# Patient Record
Sex: Female | Born: 1970
Health system: Southern US, Community
[De-identification: ages and names within clinical notes are randomized; demographics above are authoritative.]

## PROBLEM LIST (undated history)

## (undated) DIAGNOSIS — M2550 Pain in unspecified joint: Secondary | ICD-10-CM

## (undated) DIAGNOSIS — F988 Other specified behavioral and emotional disorders with onset usually occurring in childhood and adolescence: Secondary | ICD-10-CM

## (undated) DIAGNOSIS — J45909 Unspecified asthma, uncomplicated: Secondary | ICD-10-CM

## (undated) DIAGNOSIS — J189 Pneumonia, unspecified organism: Secondary | ICD-10-CM

## (undated) DIAGNOSIS — E559 Vitamin D deficiency, unspecified: Secondary | ICD-10-CM

## (undated) DIAGNOSIS — K5900 Constipation, unspecified: Secondary | ICD-10-CM

## (undated) DIAGNOSIS — F319 Bipolar disorder, unspecified: Secondary | ICD-10-CM

## (undated) DIAGNOSIS — F3289 Other specified depressive episodes: Secondary | ICD-10-CM

## (undated) DIAGNOSIS — J449 Chronic obstructive pulmonary disease, unspecified: Secondary | ICD-10-CM

## (undated) DIAGNOSIS — D751 Secondary polycythemia: Secondary | ICD-10-CM

## (undated) DIAGNOSIS — R7303 Prediabetes: Secondary | ICD-10-CM

## (undated) DIAGNOSIS — G43009 Migraine without aura, not intractable, without status migrainosus: Secondary | ICD-10-CM

## (undated) DIAGNOSIS — K219 Gastro-esophageal reflux disease without esophagitis: Secondary | ICD-10-CM

## (undated) DIAGNOSIS — I10 Essential (primary) hypertension: Secondary | ICD-10-CM

## (undated) DIAGNOSIS — I2510 Atherosclerotic heart disease of native coronary artery without angina pectoris: Secondary | ICD-10-CM

## (undated) DIAGNOSIS — F411 Generalized anxiety disorder: Secondary | ICD-10-CM

## (undated) DIAGNOSIS — M1990 Unspecified osteoarthritis, unspecified site: Secondary | ICD-10-CM

## (undated) DIAGNOSIS — G709 Myoneural disorder, unspecified: Secondary | ICD-10-CM

## (undated) DIAGNOSIS — F329 Major depressive disorder, single episode, unspecified: Secondary | ICD-10-CM

## (undated) DIAGNOSIS — R569 Unspecified convulsions: Secondary | ICD-10-CM

## (undated) DIAGNOSIS — T7840XA Allergy, unspecified, initial encounter: Secondary | ICD-10-CM

## (undated) HISTORY — DX: Generalized anxiety disorder: F41.1

## (undated) HISTORY — DX: Vitamin D deficiency, unspecified: E55.9

## (undated) HISTORY — PX: ABDOMINAL HYSTERECTOMY: SHX81

## (undated) HISTORY — DX: Gastro-esophageal reflux disease without esophagitis: K21.9

## (undated) HISTORY — DX: Major depressive disorder, single episode, unspecified: F32.9

## (undated) HISTORY — DX: Pain in unspecified joint: M25.50

## (undated) HISTORY — DX: Other specified behavioral and emotional disorders with onset usually occurring in childhood and adolescence: F98.8

## (undated) HISTORY — PX: TONSILLECTOMY: SHX5217

## (undated) HISTORY — PX: TUBAL LIGATION: SHX77

## (undated) HISTORY — DX: Unspecified asthma, uncomplicated: J45.909

## (undated) HISTORY — PX: KNEE ARTHROSCOPY: SHX127

## (undated) HISTORY — DX: Unspecified convulsions: R56.9

## (undated) HISTORY — DX: Essential (primary) hypertension: I10

## (undated) HISTORY — DX: Myoneural disorder, unspecified: G70.9

## (undated) HISTORY — DX: Other specified depressive episodes: F32.89

## (undated) HISTORY — DX: Chronic obstructive pulmonary disease, unspecified: J44.9

## (undated) HISTORY — DX: Allergy, unspecified, initial encounter: T78.40XA

## (undated) HISTORY — PX: FRACTURE SURGERY: SHX138

## (undated) HISTORY — DX: Migraine without aura, not intractable, without status migrainosus: G43.009

---

## 1997-10-04 ENCOUNTER — Ambulatory Visit (HOSPITAL_COMMUNITY): Admission: RE | Admit: 1997-10-04 | Discharge: 1997-10-04 | Payer: Self-pay | Admitting: Internal Medicine

## 1997-10-12 HISTORY — PX: OTHER SURGICAL HISTORY: SHX169

## 1997-12-08 ENCOUNTER — Other Ambulatory Visit: Admission: RE | Admit: 1997-12-08 | Discharge: 1997-12-08 | Payer: Self-pay | Admitting: Obstetrics and Gynecology

## 1998-01-28 HISTORY — PX: PLANTAR FASCIA SURGERY: SHX746

## 1998-11-01 ENCOUNTER — Other Ambulatory Visit: Admission: RE | Admit: 1998-11-01 | Discharge: 1998-11-01 | Payer: Self-pay | Admitting: Obstetrics and Gynecology

## 1999-04-14 ENCOUNTER — Emergency Department (HOSPITAL_COMMUNITY): Admission: EM | Admit: 1999-04-14 | Discharge: 1999-04-14 | Payer: Self-pay | Admitting: Emergency Medicine

## 1999-04-14 ENCOUNTER — Encounter: Payer: Self-pay | Admitting: Emergency Medicine

## 1999-10-14 ENCOUNTER — Emergency Department (HOSPITAL_COMMUNITY): Admission: EM | Admit: 1999-10-14 | Discharge: 1999-10-14 | Payer: Self-pay | Admitting: Emergency Medicine

## 2000-04-29 HISTORY — PX: TONSILLECTOMY: SUR1361

## 2000-05-01 ENCOUNTER — Encounter (INDEPENDENT_AMBULATORY_CARE_PROVIDER_SITE_OTHER): Payer: Self-pay | Admitting: *Deleted

## 2000-05-01 ENCOUNTER — Ambulatory Visit (HOSPITAL_BASED_OUTPATIENT_CLINIC_OR_DEPARTMENT_OTHER): Admission: RE | Admit: 2000-05-01 | Discharge: 2000-05-01 | Payer: Self-pay | Admitting: *Deleted

## 2000-05-07 ENCOUNTER — Observation Stay (HOSPITAL_COMMUNITY): Admission: EM | Admit: 2000-05-07 | Discharge: 2000-05-08 | Payer: Self-pay | Admitting: Emergency Medicine

## 2002-11-29 ENCOUNTER — Emergency Department (HOSPITAL_COMMUNITY): Admission: EM | Admit: 2002-11-29 | Discharge: 2002-11-29 | Payer: Self-pay | Admitting: Emergency Medicine

## 2003-08-02 ENCOUNTER — Emergency Department (HOSPITAL_COMMUNITY): Admission: EM | Admit: 2003-08-02 | Discharge: 2003-08-02 | Payer: Self-pay | Admitting: Family Medicine

## 2003-08-22 ENCOUNTER — Encounter: Admission: RE | Admit: 2003-08-22 | Discharge: 2003-08-22 | Payer: Self-pay | Admitting: Internal Medicine

## 2003-12-06 ENCOUNTER — Ambulatory Visit: Payer: Self-pay | Admitting: Internal Medicine

## 2004-01-03 ENCOUNTER — Other Ambulatory Visit: Admission: RE | Admit: 2004-01-03 | Discharge: 2004-01-03 | Payer: Self-pay | Admitting: Obstetrics and Gynecology

## 2004-01-10 ENCOUNTER — Ambulatory Visit: Payer: Self-pay | Admitting: Internal Medicine

## 2004-05-14 ENCOUNTER — Ambulatory Visit: Payer: Self-pay | Admitting: Internal Medicine

## 2004-06-19 ENCOUNTER — Ambulatory Visit: Payer: Self-pay | Admitting: Internal Medicine

## 2004-07-10 ENCOUNTER — Ambulatory Visit: Payer: Self-pay | Admitting: Internal Medicine

## 2004-08-14 ENCOUNTER — Ambulatory Visit: Payer: Self-pay | Admitting: Internal Medicine

## 2004-09-13 ENCOUNTER — Ambulatory Visit: Payer: Self-pay | Admitting: Internal Medicine

## 2004-11-15 ENCOUNTER — Ambulatory Visit: Payer: Self-pay | Admitting: Internal Medicine

## 2005-01-28 HISTORY — PX: PARTIAL HYSTERECTOMY: SHX80

## 2005-02-12 ENCOUNTER — Ambulatory Visit: Payer: Self-pay | Admitting: Internal Medicine

## 2005-03-26 ENCOUNTER — Ambulatory Visit: Payer: Self-pay | Admitting: Internal Medicine

## 2005-05-17 ENCOUNTER — Ambulatory Visit: Payer: Self-pay | Admitting: Internal Medicine

## 2005-05-30 ENCOUNTER — Ambulatory Visit: Payer: Self-pay | Admitting: Internal Medicine

## 2005-08-29 ENCOUNTER — Ambulatory Visit: Payer: Self-pay | Admitting: Unknown Physician Specialty

## 2005-12-27 ENCOUNTER — Ambulatory Visit: Payer: Self-pay | Admitting: Internal Medicine

## 2006-06-18 ENCOUNTER — Ambulatory Visit: Payer: Self-pay | Admitting: Internal Medicine

## 2006-11-12 ENCOUNTER — Encounter: Payer: Self-pay | Admitting: Internal Medicine

## 2006-11-12 DIAGNOSIS — F411 Generalized anxiety disorder: Secondary | ICD-10-CM | POA: Insufficient documentation

## 2006-11-12 DIAGNOSIS — I10 Essential (primary) hypertension: Secondary | ICD-10-CM | POA: Insufficient documentation

## 2006-11-12 DIAGNOSIS — J45909 Unspecified asthma, uncomplicated: Secondary | ICD-10-CM | POA: Insufficient documentation

## 2006-12-11 ENCOUNTER — Ambulatory Visit: Payer: Self-pay | Admitting: Internal Medicine

## 2006-12-11 LAB — CONVERTED CEMR LAB
ALT: 18 units/L (ref 0–35)
AST: 18 units/L (ref 0–37)
Albumin: 4 g/dL (ref 3.5–5.2)
Alkaline Phosphatase: 106 units/L (ref 39–117)
BUN: 11 mg/dL (ref 6–23)
Basophils Absolute: 0 10*3/uL (ref 0.0–0.1)
Basophils Relative: 0.6 % (ref 0.0–1.0)
Bilirubin, Direct: 0.1 mg/dL (ref 0.0–0.3)
CO2: 26 meq/L (ref 19–32)
Calcium: 9.1 mg/dL (ref 8.4–10.5)
Chloride: 104 meq/L (ref 96–112)
Creatinine, Ser: 0.6 mg/dL (ref 0.4–1.2)
Eosinophils Absolute: 0.1 10*3/uL (ref 0.0–0.6)
Eosinophils Relative: 1.7 % (ref 0.0–5.0)
GFR calc Af Amer: 147 mL/min
GFR calc non Af Amer: 122 mL/min
Glucose, Bld: 96 mg/dL (ref 70–99)
HCT: 43 % (ref 36.0–46.0)
Hemoglobin: 14.9 g/dL (ref 12.0–15.0)
Lymphocytes Relative: 27 % (ref 12.0–46.0)
MCHC: 34.6 g/dL (ref 30.0–36.0)
MCV: 92 fL (ref 78.0–100.0)
Monocytes Absolute: 0.8 10*3/uL — ABNORMAL HIGH (ref 0.2–0.7)
Monocytes Relative: 12 % — ABNORMAL HIGH (ref 3.0–11.0)
Neutro Abs: 4.1 10*3/uL (ref 1.4–7.7)
Neutrophils Relative %: 58.7 % (ref 43.0–77.0)
Platelets: 259 10*3/uL (ref 150–400)
Potassium: 4 meq/L (ref 3.5–5.1)
RBC: 4.67 M/uL (ref 3.87–5.11)
RDW: 11.5 % (ref 11.5–14.6)
Sodium: 137 meq/L (ref 135–145)
TSH: 2.06 microintl units/mL (ref 0.35–5.50)
Total Bilirubin: 0.8 mg/dL (ref 0.3–1.2)
Total Protein: 7.2 g/dL (ref 6.0–8.3)
WBC: 6.9 10*3/uL (ref 4.5–10.5)

## 2006-12-13 ENCOUNTER — Encounter: Payer: Self-pay | Admitting: Internal Medicine

## 2006-12-15 LAB — CONVERTED CEMR LAB: Vit D, 1,25-Dihydroxy: 19 — ABNORMAL LOW (ref 30–89)

## 2006-12-16 ENCOUNTER — Ambulatory Visit: Payer: Self-pay | Admitting: Internal Medicine

## 2006-12-16 DIAGNOSIS — E559 Vitamin D deficiency, unspecified: Secondary | ICD-10-CM | POA: Insufficient documentation

## 2006-12-16 DIAGNOSIS — G4700 Insomnia, unspecified: Secondary | ICD-10-CM | POA: Insufficient documentation

## 2007-02-06 ENCOUNTER — Ambulatory Visit: Payer: Self-pay | Admitting: Internal Medicine

## 2007-02-06 DIAGNOSIS — R635 Abnormal weight gain: Secondary | ICD-10-CM | POA: Insufficient documentation

## 2007-02-06 DIAGNOSIS — R32 Unspecified urinary incontinence: Secondary | ICD-10-CM | POA: Insufficient documentation

## 2007-03-16 ENCOUNTER — Encounter: Payer: Self-pay | Admitting: Internal Medicine

## 2007-07-16 ENCOUNTER — Encounter: Payer: Self-pay | Admitting: Internal Medicine

## 2007-08-26 ENCOUNTER — Telehealth: Payer: Self-pay | Admitting: Internal Medicine

## 2007-10-08 ENCOUNTER — Ambulatory Visit: Payer: Self-pay | Admitting: Internal Medicine

## 2007-10-08 DIAGNOSIS — F988 Other specified behavioral and emotional disorders with onset usually occurring in childhood and adolescence: Secondary | ICD-10-CM | POA: Insufficient documentation

## 2007-10-08 DIAGNOSIS — R209 Unspecified disturbances of skin sensation: Secondary | ICD-10-CM | POA: Insufficient documentation

## 2007-11-04 ENCOUNTER — Ambulatory Visit: Payer: Self-pay | Admitting: Internal Medicine

## 2007-12-01 ENCOUNTER — Telehealth: Payer: Self-pay | Admitting: Internal Medicine

## 2007-12-21 ENCOUNTER — Telehealth: Payer: Self-pay | Admitting: Internal Medicine

## 2008-01-13 ENCOUNTER — Ambulatory Visit: Payer: Self-pay | Admitting: Internal Medicine

## 2008-01-13 ENCOUNTER — Telehealth: Payer: Self-pay | Admitting: Internal Medicine

## 2008-01-13 LAB — CONVERTED CEMR LAB
Bilirubin Urine: NEGATIVE
Hemoglobin, Urine: NEGATIVE
Ketones, ur: NEGATIVE mg/dL
Leukocytes, UA: NEGATIVE
Nitrite: NEGATIVE
Specific Gravity, Urine: <=1.005 (ref 1.000–1.03)
Total Protein, Urine: NEGATIVE mg/dL
Urine Glucose: NEGATIVE mg/dL
Urobilinogen, UA: 0.2 (ref 0.0–1.0)
pH: 6 (ref 5.0–8.0)

## 2008-01-14 ENCOUNTER — Ambulatory Visit: Payer: Self-pay | Admitting: Internal Medicine

## 2008-01-14 DIAGNOSIS — R300 Dysuria: Secondary | ICD-10-CM | POA: Insufficient documentation

## 2008-03-10 ENCOUNTER — Telehealth: Payer: Self-pay | Admitting: Internal Medicine

## 2008-04-25 ENCOUNTER — Ambulatory Visit: Payer: Self-pay | Admitting: Internal Medicine

## 2008-05-23 ENCOUNTER — Telehealth: Payer: Self-pay | Admitting: Internal Medicine

## 2008-07-27 ENCOUNTER — Ambulatory Visit: Payer: Self-pay | Admitting: Internal Medicine

## 2008-07-29 LAB — CONVERTED CEMR LAB
ALT: 20 units/L (ref 0–35)
AST: 18 units/L (ref 0–37)
Albumin: 4 g/dL (ref 3.5–5.2)
Alkaline Phosphatase: 120 units/L — ABNORMAL HIGH (ref 39–117)
BUN: 9 mg/dL (ref 6–23)
Basophils Absolute: 0.1 10*3/uL (ref 0.0–0.1)
Basophils Relative: 0.7 % (ref 0.0–3.0)
Bilirubin Urine: NEGATIVE
Bilirubin, Direct: 0.2 mg/dL (ref 0.0–0.3)
CO2: 28 meq/L (ref 19–32)
Calcium: 9.3 mg/dL (ref 8.4–10.5)
Chloride: 107 meq/L (ref 96–112)
Creatinine, Ser: 0.6 mg/dL (ref 0.4–1.2)
Eosinophils Absolute: 0.3 10*3/uL (ref 0.0–0.7)
Eosinophils Relative: 3.7 % (ref 0.0–5.0)
GFR calc non Af Amer: 120.45 mL/min (ref >60)
Glucose, Bld: 94 mg/dL (ref 70–99)
HCT: 42.8 % (ref 36.0–46.0)
Hemoglobin, Urine: NEGATIVE
Hemoglobin: 15.2 g/dL — ABNORMAL HIGH (ref 12.0–15.0)
Ketones, ur: NEGATIVE mg/dL
Leukocytes, UA: NEGATIVE
Lymphocytes Relative: 26.6 % (ref 12.0–46.0)
Lymphs Abs: 2.1 10*3/uL (ref 0.7–4.0)
MCHC: 35.6 g/dL (ref 30.0–36.0)
MCV: 90.7 fL (ref 78.0–100.0)
Monocytes Absolute: 0.8 10*3/uL (ref 0.1–1.0)
Monocytes Relative: 9.8 % (ref 3.0–12.0)
Neutro Abs: 4.6 10*3/uL (ref 1.4–7.7)
Neutrophils Relative %: 59.2 % (ref 43.0–77.0)
Nitrite: NEGATIVE
Platelets: 210 10*3/uL (ref 150.0–400.0)
Potassium: 4.2 meq/L (ref 3.5–5.1)
RBC: 4.72 M/uL (ref 3.87–5.11)
RDW: 11.3 % — ABNORMAL LOW (ref 11.5–14.6)
Sed Rate: 15 mm/hr (ref 0–22)
Sodium: 141 meq/L (ref 135–145)
Specific Gravity, Urine: <=1.005 (ref 1.000–1.030)
TSH: 3.14 microintl units/mL (ref 0.35–5.50)
Total Bilirubin: 0.6 mg/dL (ref 0.3–1.2)
Total Protein, Urine: NEGATIVE mg/dL
Total Protein: 7.2 g/dL (ref 6.0–8.3)
Urine Glucose: NEGATIVE mg/dL
Urobilinogen, UA: 0.2 (ref 0.0–1.0)
Vitamin B-12: 728 pg/mL (ref 211–911)
WBC: 7.9 10*3/uL (ref 4.5–10.5)
pH: 7.5 (ref 5.0–8.0)

## 2008-08-02 LAB — CONVERTED CEMR LAB: Vit D, 25-Hydroxy: 19 ng/mL — ABNORMAL LOW (ref 30–89)

## 2008-09-08 ENCOUNTER — Ambulatory Visit: Payer: Self-pay | Admitting: Internal Medicine

## 2008-09-08 DIAGNOSIS — L03319 Cellulitis of trunk, unspecified: Secondary | ICD-10-CM

## 2008-09-08 DIAGNOSIS — L02219 Cutaneous abscess of trunk, unspecified: Secondary | ICD-10-CM | POA: Insufficient documentation

## 2008-09-10 ENCOUNTER — Emergency Department (HOSPITAL_COMMUNITY): Admission: EM | Admit: 2008-09-10 | Discharge: 2008-09-10 | Payer: Self-pay | Admitting: Emergency Medicine

## 2008-09-11 ENCOUNTER — Emergency Department (HOSPITAL_COMMUNITY): Admission: EM | Admit: 2008-09-11 | Discharge: 2008-09-11 | Payer: Self-pay | Admitting: Emergency Medicine

## 2008-11-14 ENCOUNTER — Ambulatory Visit: Payer: Self-pay | Admitting: Internal Medicine

## 2008-11-14 DIAGNOSIS — F17200 Nicotine dependence, unspecified, uncomplicated: Secondary | ICD-10-CM | POA: Insufficient documentation

## 2009-01-28 HISTORY — PX: INCISE AND DRAIN ABCESS: PRO64

## 2009-01-30 ENCOUNTER — Telehealth: Payer: Self-pay | Admitting: Internal Medicine

## 2009-02-03 ENCOUNTER — Telehealth: Payer: Self-pay | Admitting: Internal Medicine

## 2009-02-28 ENCOUNTER — Telehealth: Payer: Self-pay | Admitting: Internal Medicine

## 2009-03-01 ENCOUNTER — Ambulatory Visit: Payer: Self-pay | Admitting: Internal Medicine

## 2009-03-01 LAB — CONVERTED CEMR LAB
BUN: 12 mg/dL (ref 6–23)
CO2: 26 meq/L (ref 19–32)
Calcium: 9.5 mg/dL (ref 8.4–10.5)
Chloride: 104 meq/L (ref 96–112)
Creatinine, Ser: 0.6 mg/dL (ref 0.4–1.2)
GFR calc non Af Amer: 120.05 mL/min (ref >60)
Glucose, Bld: 115 mg/dL — ABNORMAL HIGH (ref 70–99)
Potassium: 3.7 meq/L (ref 3.5–5.1)
Sodium: 137 meq/L (ref 135–145)

## 2009-03-05 LAB — CONVERTED CEMR LAB: Vit D, 25-Hydroxy: 24 ng/mL — ABNORMAL LOW (ref 30–89)

## 2009-03-06 ENCOUNTER — Ambulatory Visit: Payer: Self-pay | Admitting: Internal Medicine

## 2009-03-06 DIAGNOSIS — J069 Acute upper respiratory infection, unspecified: Secondary | ICD-10-CM | POA: Insufficient documentation

## 2009-03-07 ENCOUNTER — Telehealth: Payer: Self-pay | Admitting: Internal Medicine

## 2009-03-08 ENCOUNTER — Encounter: Payer: Self-pay | Admitting: Internal Medicine

## 2009-03-13 ENCOUNTER — Ambulatory Visit: Payer: Self-pay | Admitting: Internal Medicine

## 2009-04-03 ENCOUNTER — Encounter: Payer: Self-pay | Admitting: Internal Medicine

## 2009-04-03 ENCOUNTER — Telehealth: Payer: Self-pay | Admitting: Internal Medicine

## 2009-04-20 ENCOUNTER — Ambulatory Visit: Payer: Self-pay | Admitting: Internal Medicine

## 2009-04-20 DIAGNOSIS — IMO0002 Reserved for concepts with insufficient information to code with codable children: Secondary | ICD-10-CM | POA: Insufficient documentation

## 2009-04-22 ENCOUNTER — Inpatient Hospital Stay (HOSPITAL_COMMUNITY): Admission: AD | Admit: 2009-04-22 | Discharge: 2009-04-26 | Payer: Self-pay | Admitting: Internal Medicine

## 2009-04-22 ENCOUNTER — Ambulatory Visit: Payer: Self-pay | Admitting: Family Medicine

## 2009-04-26 ENCOUNTER — Telehealth: Payer: Self-pay | Admitting: Internal Medicine

## 2009-05-02 ENCOUNTER — Ambulatory Visit: Payer: Self-pay | Admitting: Internal Medicine

## 2009-05-02 DIAGNOSIS — R5381 Other malaise: Secondary | ICD-10-CM | POA: Insufficient documentation

## 2009-05-02 DIAGNOSIS — R519 Headache, unspecified: Secondary | ICD-10-CM | POA: Insufficient documentation

## 2009-05-02 DIAGNOSIS — R5383 Other fatigue: Secondary | ICD-10-CM

## 2009-05-02 DIAGNOSIS — R21 Rash and other nonspecific skin eruption: Secondary | ICD-10-CM | POA: Insufficient documentation

## 2009-05-02 DIAGNOSIS — R1110 Vomiting, unspecified: Secondary | ICD-10-CM | POA: Insufficient documentation

## 2009-05-10 ENCOUNTER — Encounter: Payer: Self-pay | Admitting: Internal Medicine

## 2009-05-31 ENCOUNTER — Telehealth: Payer: Self-pay | Admitting: Internal Medicine

## 2009-06-09 ENCOUNTER — Telehealth: Payer: Self-pay | Admitting: Internal Medicine

## 2009-06-13 ENCOUNTER — Ambulatory Visit: Payer: Self-pay | Admitting: Internal Medicine

## 2009-06-18 ENCOUNTER — Encounter: Payer: Self-pay | Admitting: Internal Medicine

## 2009-06-21 ENCOUNTER — Encounter: Payer: Self-pay | Admitting: Internal Medicine

## 2009-06-23 ENCOUNTER — Telehealth: Payer: Self-pay | Admitting: Internal Medicine

## 2009-07-27 ENCOUNTER — Telehealth: Payer: Self-pay | Admitting: Internal Medicine

## 2009-08-10 ENCOUNTER — Ambulatory Visit: Payer: Self-pay | Admitting: Internal Medicine

## 2009-08-10 DIAGNOSIS — M2550 Pain in unspecified joint: Secondary | ICD-10-CM | POA: Insufficient documentation

## 2009-08-10 DIAGNOSIS — G43709 Chronic migraine without aura, not intractable, without status migrainosus: Secondary | ICD-10-CM | POA: Insufficient documentation

## 2009-08-10 DIAGNOSIS — G43009 Migraine without aura, not intractable, without status migrainosus: Secondary | ICD-10-CM | POA: Insufficient documentation

## 2009-08-10 DIAGNOSIS — R252 Cramp and spasm: Secondary | ICD-10-CM | POA: Insufficient documentation

## 2009-08-15 ENCOUNTER — Ambulatory Visit: Payer: Self-pay | Admitting: Internal Medicine

## 2009-08-24 ENCOUNTER — Telehealth: Payer: Self-pay | Admitting: Internal Medicine

## 2009-08-25 ENCOUNTER — Ambulatory Visit: Payer: Self-pay | Admitting: Internal Medicine

## 2009-08-25 LAB — CONVERTED CEMR LAB
ALT: 21 units/L (ref 0–35)
AST: 18 units/L (ref 0–37)
Albumin: 4 g/dL (ref 3.5–5.2)
Alkaline Phosphatase: 118 units/L — ABNORMAL HIGH (ref 39–117)
BUN: 10 mg/dL (ref 6–23)
Basophils Absolute: 0.1 10*3/uL (ref 0.0–0.1)
Basophils Relative: 0.7 % (ref 0.0–3.0)
Bilirubin Urine: NEGATIVE
Bilirubin, Direct: 0.1 mg/dL (ref 0.0–0.3)
CO2: 25 meq/L (ref 19–32)
Calcium: 8.5 mg/dL (ref 8.4–10.5)
Chloride: 103 meq/L (ref 96–112)
Cholesterol: 173 mg/dL (ref 0–200)
Creatinine, Ser: 0.6 mg/dL (ref 0.4–1.2)
Direct LDL: 106.4 mg/dL
Eosinophils Absolute: 0.2 10*3/uL (ref 0.0–0.7)
Eosinophils Relative: 2.8 % (ref 0.0–5.0)
Folate: >20 ng/mL
GFR calc non Af Amer: 124.5 mL/min (ref >60)
Glucose, Bld: 114 mg/dL — ABNORMAL HIGH (ref 70–99)
HCT: 42 % (ref 36.0–46.0)
HDL: 25.1 mg/dL — ABNORMAL LOW (ref >39.00)
Hemoglobin, Urine: NEGATIVE
Hemoglobin: 14.8 g/dL (ref 12.0–15.0)
Ketones, ur: NEGATIVE mg/dL
Leukocytes, UA: NEGATIVE
Lymphocytes Relative: 24 % (ref 12.0–46.0)
Lymphs Abs: 2.1 10*3/uL (ref 0.7–4.0)
MCHC: 35.3 g/dL (ref 30.0–36.0)
MCV: 92.9 fL (ref 78.0–100.0)
Magnesium: 1.9 mg/dL (ref 1.5–2.5)
Monocytes Absolute: 0.8 10*3/uL (ref 0.1–1.0)
Monocytes Relative: 9.6 % (ref 3.0–12.0)
Neutro Abs: 5.4 10*3/uL (ref 1.4–7.7)
Neutrophils Relative %: 62.9 % (ref 43.0–77.0)
Nitrite: NEGATIVE
Platelets: 234 10*3/uL (ref 150.0–400.0)
Potassium: 3.5 meq/L (ref 3.5–5.1)
RBC: 4.53 M/uL (ref 3.87–5.11)
RDW: 12.8 % (ref 11.5–14.6)
Sed Rate: 19 mm/hr (ref 0–22)
Sodium: 135 meq/L (ref 135–145)
Specific Gravity, Urine: 1.01 (ref 1.000–1.030)
TSH: 1.43 microintl units/mL (ref 0.35–5.50)
Total Bilirubin: 0.7 mg/dL (ref 0.3–1.2)
Total CHOL/HDL Ratio: 7
Total Protein, Urine: NEGATIVE mg/dL
Total Protein: 6.8 g/dL (ref 6.0–8.3)
Triglycerides: 300 mg/dL — ABNORMAL HIGH (ref 0.0–149.0)
Urine Glucose: NEGATIVE mg/dL
Urobilinogen, UA: 0.2 (ref 0.0–1.0)
VLDL: 60 mg/dL — ABNORMAL HIGH (ref 0.0–40.0)
Vitamin B-12: 735 pg/mL (ref 211–911)
WBC: 8.6 10*3/uL (ref 4.5–10.5)
pH: 7.5 (ref 5.0–8.0)

## 2009-08-28 ENCOUNTER — Telehealth: Payer: Self-pay | Admitting: Internal Medicine

## 2009-08-28 ENCOUNTER — Ambulatory Visit: Payer: Self-pay | Admitting: Licensed Clinical Social Worker

## 2009-08-28 LAB — CONVERTED CEMR LAB
ANA Titer 1: NEGATIVE
Hgb A1c MFr Bld: 5.5 % (ref 4.6–6.5)

## 2009-08-31 ENCOUNTER — Ambulatory Visit: Payer: Self-pay | Admitting: Internal Medicine

## 2009-09-01 ENCOUNTER — Ambulatory Visit: Payer: Self-pay | Admitting: Licensed Clinical Social Worker

## 2009-09-11 ENCOUNTER — Ambulatory Visit: Payer: Self-pay | Admitting: Internal Medicine

## 2009-09-15 ENCOUNTER — Ambulatory Visit: Payer: Self-pay | Admitting: Licensed Clinical Social Worker

## 2009-09-18 ENCOUNTER — Telehealth: Payer: Self-pay | Admitting: Internal Medicine

## 2009-09-21 ENCOUNTER — Encounter: Payer: Self-pay | Admitting: Internal Medicine

## 2009-09-28 ENCOUNTER — Ambulatory Visit: Payer: Self-pay | Admitting: Internal Medicine

## 2009-09-28 DIAGNOSIS — K219 Gastro-esophageal reflux disease without esophagitis: Secondary | ICD-10-CM | POA: Insufficient documentation

## 2009-09-29 ENCOUNTER — Ambulatory Visit: Payer: Self-pay | Admitting: Licensed Clinical Social Worker

## 2009-10-03 ENCOUNTER — Telehealth: Payer: Self-pay | Admitting: Internal Medicine

## 2009-10-06 ENCOUNTER — Ambulatory Visit: Payer: Self-pay | Admitting: Licensed Clinical Social Worker

## 2009-10-11 ENCOUNTER — Ambulatory Visit: Payer: Self-pay | Admitting: Internal Medicine

## 2009-10-11 ENCOUNTER — Encounter: Payer: Self-pay | Admitting: Internal Medicine

## 2009-10-11 ENCOUNTER — Telehealth: Payer: Self-pay | Admitting: Internal Medicine

## 2009-10-11 DIAGNOSIS — R42 Dizziness and giddiness: Secondary | ICD-10-CM | POA: Insufficient documentation

## 2009-10-11 DIAGNOSIS — R000 Tachycardia, unspecified: Secondary | ICD-10-CM | POA: Insufficient documentation

## 2009-10-12 ENCOUNTER — Encounter: Payer: Self-pay | Admitting: Internal Medicine

## 2009-10-18 ENCOUNTER — Ambulatory Visit: Payer: Self-pay | Admitting: Internal Medicine

## 2009-10-20 ENCOUNTER — Ambulatory Visit: Payer: Self-pay | Admitting: Licensed Clinical Social Worker

## 2009-10-23 ENCOUNTER — Telehealth: Payer: Self-pay | Admitting: Internal Medicine

## 2009-10-26 ENCOUNTER — Telehealth: Payer: Self-pay | Admitting: Internal Medicine

## 2009-10-27 ENCOUNTER — Telehealth (INDEPENDENT_AMBULATORY_CARE_PROVIDER_SITE_OTHER): Payer: Self-pay | Admitting: *Deleted

## 2009-11-01 ENCOUNTER — Ambulatory Visit: Payer: Self-pay | Admitting: Internal Medicine

## 2009-11-01 DIAGNOSIS — J019 Acute sinusitis, unspecified: Secondary | ICD-10-CM | POA: Insufficient documentation

## 2009-11-01 DIAGNOSIS — R059 Cough, unspecified: Secondary | ICD-10-CM | POA: Insufficient documentation

## 2009-11-01 DIAGNOSIS — R05 Cough: Secondary | ICD-10-CM

## 2009-11-02 ENCOUNTER — Telehealth: Payer: Self-pay | Admitting: Internal Medicine

## 2009-11-03 ENCOUNTER — Ambulatory Visit: Payer: Self-pay | Admitting: Internal Medicine

## 2009-11-06 ENCOUNTER — Telehealth: Payer: Self-pay | Admitting: Internal Medicine

## 2009-11-08 ENCOUNTER — Telehealth: Payer: Self-pay | Admitting: Internal Medicine

## 2009-11-10 ENCOUNTER — Telehealth: Payer: Self-pay | Admitting: Internal Medicine

## 2009-11-10 ENCOUNTER — Ambulatory Visit: Payer: Self-pay | Admitting: Licensed Clinical Social Worker

## 2009-11-10 ENCOUNTER — Ambulatory Visit: Payer: Self-pay | Admitting: Internal Medicine

## 2009-11-13 ENCOUNTER — Telehealth (INDEPENDENT_AMBULATORY_CARE_PROVIDER_SITE_OTHER): Payer: Self-pay | Admitting: *Deleted

## 2009-11-13 ENCOUNTER — Encounter: Payer: Self-pay | Admitting: Internal Medicine

## 2009-11-14 ENCOUNTER — Telehealth: Payer: Self-pay | Admitting: Internal Medicine

## 2009-11-16 ENCOUNTER — Telehealth: Payer: Self-pay | Admitting: Internal Medicine

## 2009-11-17 ENCOUNTER — Telehealth (INDEPENDENT_AMBULATORY_CARE_PROVIDER_SITE_OTHER): Payer: Self-pay | Admitting: *Deleted

## 2009-11-23 ENCOUNTER — Telehealth: Payer: Self-pay | Admitting: Internal Medicine

## 2009-11-24 ENCOUNTER — Ambulatory Visit: Payer: Self-pay | Admitting: Internal Medicine

## 2009-11-28 HISTORY — PX: NM MYOCAR PERF WALL MOTION: HXRAD629

## 2009-11-28 HISTORY — PX: TRANSTHORACIC ECHOCARDIOGRAM: SHX275

## 2009-11-30 ENCOUNTER — Encounter: Payer: Self-pay | Admitting: Internal Medicine

## 2009-12-01 ENCOUNTER — Ambulatory Visit: Payer: Self-pay | Admitting: Licensed Clinical Social Worker

## 2009-12-06 ENCOUNTER — Telehealth (INDEPENDENT_AMBULATORY_CARE_PROVIDER_SITE_OTHER): Payer: Self-pay | Admitting: *Deleted

## 2009-12-28 ENCOUNTER — Telehealth: Payer: Self-pay | Admitting: Internal Medicine

## 2010-01-18 ENCOUNTER — Encounter: Payer: Self-pay | Admitting: Internal Medicine

## 2010-01-26 ENCOUNTER — Telehealth (INDEPENDENT_AMBULATORY_CARE_PROVIDER_SITE_OTHER): Payer: Self-pay | Admitting: *Deleted

## 2010-02-19 ENCOUNTER — Encounter: Payer: Self-pay | Admitting: Internal Medicine

## 2010-02-20 ENCOUNTER — Telehealth: Payer: Self-pay | Admitting: Internal Medicine

## 2010-02-21 ENCOUNTER — Telehealth: Payer: Self-pay | Admitting: Internal Medicine

## 2010-02-27 ENCOUNTER — Ambulatory Visit
Admission: RE | Admit: 2010-02-27 | Discharge: 2010-02-27 | Payer: Self-pay | Source: Home / Self Care | Attending: Internal Medicine | Admitting: Internal Medicine

## 2010-02-27 DIAGNOSIS — R609 Edema, unspecified: Secondary | ICD-10-CM | POA: Insufficient documentation

## 2010-02-27 DIAGNOSIS — M79609 Pain in unspecified limb: Secondary | ICD-10-CM | POA: Insufficient documentation

## 2010-02-27 NOTE — Progress Notes (Signed)
Summary: Rf Prednisone ??  Phone Note Refill Request Call back at Home Phone [phone] Message from:  Pharmacy  Refills Requested: Medication #1:  Prednisone 10mg  (not on list)   Supply Requested: 24  Method Requested: Electronic Initial call taken by: Lanier Prude, [facility]),  October 26, 2009 10:55 AM  Follow-up for Phone Call        Pt left vm req rx for chest congestion.  Follow-up by: Lamar Sprinkles, CMA,  October 26, 2009 10:59 AM  Additional Follow-up for Phone Call Additional follow up Details #1::        ok to ref Keep return office visit  Call if you are not better in a reasonable amount of time or if worse. Go to ER if feeling really bad!  Additional Follow-up by: Tresa Garter MD,  October 26, 2009 11:38 AM    Additional Follow-up for Phone Call Additional follow up Details #2::    Pt informed of MD's advisement and RX Follow-up by: Margaret Pyle, CMA,  October 26, 2009 2:07 PM  New/Updated Medications: PREDNISONE 10 MG TABS (PREDNISONE) Take 40mg  qd for 3 days, then 20 mg qd for 3 days, then 10mg  qd for 6 days, then stop. Take pc. Prescriptions: PREDNISONE 10 MG TABS (PREDNISONE) Take 40mg  qd for 3 days, then 20 mg qd for 3 days, then 10mg  qd for 6 days, then stop. Take pc.  #24 x 1   Entered and Authorized by:   Tresa Garter MD   Signed by:   Lamar Sprinkles, CMA on 10/26/2009   Method used:   [facility]  [address] [id number]       [address]       [place], [place]  [address]       Ph: [phone] or [phone]       Fax: [phone]   RxID:   [id number]

## 2010-02-27 NOTE — Assessment & Plan Note (Signed)
Summary: 2 WK FU---STC   Vital Signs:  Patient profile:   40 year old female Height:      66 inches Weight:      212 pounds BMI:     34.34 O2 Sat:      96 % on Room air Temp:     98.5 degrees F oral Pulse rate:   124 / minute Resp:     16 per minute BP sitting:   120 / 80  (left arm) Cuff size:   large  Vitals Entered By: Lanier Prude, CMA(AAMA) (August 31, 2009 10:01 AM)  O2 Flow:  Room air CC: 2 wk f/u Is Patient Diabetic? No Comments pt states she has appt 09-21-09 to see psychiatrist.  She needs to be OOW until then   CC:  2 wk f/u.  History of Present Illness: The patient presents for a follow up of stress, anxiety, depression and pains. Not bettter.  Current Medications (verified): 1)  Xanax 0.5 Mg  Tabs (Alprazolam) .... Take 1 Tablet By Mouth Two Times A Day (Office Visit Needed For Refills) 2)  Proair Hfa 108 (90 Base) Mcg/act  Aers (Albuterol Sulfate) .... 2 Inh Q4h As Needed Shortness of Breath 3)  Vesicare 5 Mg Tabs (Solifenacin Succinate) .[name].. 1 By Mouth Qd 4)  Vitamin D3 1000 Unit  Tabs (Cholecalciferol) .... 2 Qd 5)  Mobic 15 Mg Tabs (Meloxicam) .[name].. 1 Daily 6)  Vyvanse 50 Mg Caps (Lisdexamfetamine Dimesylate) .[name].. 1 By Mouth Qam. Please,  Fill On or After  02/05/09       . Ok To Fill 1 Day Earlier When The Month Has 31 Days in It. 7)  Triamcinolone Acetonide 0.1 % Oint (Triamcinolone Acetonide) .... Use 1-2 Times A Day 8)  Hyzaar 100-25 Mg Tabs (Losartan Potassium-Hctz) .[name].. 1 By Mouth Qd 9)  Nebulizer .... As Dirr. Dx Asthma 493.90 10)  Albuterol Sulfate (2.5 Mg/43ml) 0.083% Nebu (Albuterol Sulfate) .... Use Qid Via Hhn Prn 11)  Benadryl 25 Mg Tabs (Diphenhydramine Hcl) .... As Directed 12)  Zolpidem Tartrate 12.5 Mg Cr-Tabs (Zolpidem Tartrate) .[name].. 1po At Bedtime As Needed 13)  Sumatriptan Succinate 100 Mg Tabs (Sumatriptan Succinate) .[name].. 1 By Mouth Every Other Day As Needed 14)  Paroxetine Hcl 20 Mg Tabs (Paroxetine Hcl) .[name].. 1 By Mouth Qd 15)  Flexeril  10 Mg Tabs (Cyclobenzaprine Hcl) .... As Directed  Allergies (verified): 1)  ! Amoxicillin (Amoxicillin) 2)  ! Lisinopril (Lisinopril) 3)  ! Hydrochlorothiazide (Hydrochlorothiazide) 4)  Doxycycline  Past History:  Past Medical History: Last updated: 05/02/2009 Anxiety Asthma Depression Hypertension INSOMNIA Gyn Dr Barnabas Lister FAMILY  H/0 DM2   V18.0 Urinary incontinence Vit D def ADD Vit D def H/o boils MRSA abscess 2010  Past Surgical History: Last updated: 05/02/2009 Hysterectomy partial  07 Tonsillectomy  04-29-00 ABD [place]  10-12-97 I&D R axilla MRSA abscess Dr Purnell Shoemaker 2011  Social History: Last updated: 08/15/2009 Occupation:HR;waiting tables 2 d/ wk; back to school Married 2 kids Current Smoker 1 ppd 2010  Review of Systems  The patient denies weight loss, hoarseness, prolonged cough, and melena.    Physical Exam  General:  alert and overweight-appearing.   Eyes:  vision grossly intact, pupils equal, and pupils round.   Nose:  no external deformity and no nasal discharge.   Mouth:  no gingival abnormalities and pharynx pink and moist.   Lungs:  normal respiratory effort and normal breath sounds.   Heart:  normal rate and regular rhythm.  Abdomen:  soft, non-tender, and normal bowel sounds.   Msk:  somewhat puffy bilat MCP's;  no other signifcant joint tender or swelling Extremities:  no edema, no erythema  Neurologic:  cranial nerves II-XII intact and strength normal in all extremities.   Skin:  color normal and no rashes.   Psych:    Oriented X3, not suicidal, not homicidal, depressed affect, tearful, and moderately anxious.     Impression & Recommendations:  Problem # 1:  STRESS DISORDER (ICD-V62.89) Assessment Unchanged On the regimen of medicine(s) reflected in the chart    Problem # 2:  INSOMNIA, PERSISTENT (ICD-307.42) Assessment: Unchanged  Problem # 3:  ANXIETY (ICD-300.00) Assessment: Unchanged  The following medications were removed  from the medication list:    Paroxetine Hcl 20 Mg Tabs (Paroxetine hcl) .[name]... 1 by mouth qd Her updated medication list for this problem includes:    Xanax 0.5 Mg Tabs (Alprazolam) .[name]... Take 1 tablet by mouth two times a day (office visit needed for refills)    Cymbalta 60 Mg Cpep (Duloxetine hcl) .[name]... 1 by mouth once daily for depression  Problem # 4:  COMMON MIGRAINE (ICD-346.10) Assessment: Improved  Her updated medication list for this problem includes:    Mobic 15 Mg Tabs (Meloxicam) .[name]... 1 daily    Sumatriptan Succinate 100 Mg Tabs (Sumatriptan succinate) .[name]... 1 by mouth every other day as needed  Problem # 5:  DEPRESSION (ICD-311) Assessment: Unchanged Psychiatry consult is pending  The following medications were removed from the medication list:    Paroxetine Hcl 20 Mg Tabs (Paroxetine hcl) .[name]... 1 by mouth qd Her updated medication list for this problem includes:    Xanax 0.5 Mg Tabs (Alprazolam) .[name]... Take 1 tablet by mouth two times a day (office visit needed for refills)    Cymbalta 60 Mg Cpep (Duloxetine hcl) .[name]... 1 by mouth once daily for depression  Problem # 6:  ARTHRALGIA (ICD-719.40) Assessment: Unchanged  Complete Medication List: 1)  Xanax 0.5 Mg Tabs (Alprazolam) .... Take 1 tablet by mouth two times a day (office visit needed for refills) 2)  Proair Hfa 108 (90 Base) Mcg/act Aers (Albuterol sulfate) .... 2 inh q4h as needed shortness of breath 3)  Vesicare 5 Mg Tabs (Solifenacin succinate) .[name].. 1 by mouth qd 4)  Vitamin D3 1000 Unit Tabs (Cholecalciferol) .... 2 qd 5)  Mobic 15 Mg Tabs (Meloxicam) .[name].. 1 daily 6)  Vyvanse 50 Mg Caps (Lisdexamfetamine dimesylate) .[name].. 1 by mouth qam. please,  fill on or after  02/05/09       . ok to fill 1 day earlier when the month has 31 days in it. 7)  Triamcinolone Acetonide 0.1 % Oint (Triamcinolone acetonide) .... Use 1-2 times a day 8)  Hyzaar 100-25 Mg Tabs (Losartan potassium-hctz) .[name].. 1 by mouth qd 9)  Nebulizer   .... As dirr. dx asthma 493.90 10)  Albuterol Sulfate (2.5 Mg/40ml) 0.083% Nebu (Albuterol sulfate) .... Use qid via hhn prn 11)  Benadryl 25 Mg Tabs (Diphenhydramine hcl) .... As directed 12)  Zolpidem Tartrate 12.5 Mg Cr-tabs (Zolpidem tartrate) .[name].. 1po at bedtime as needed 13)  Sumatriptan Succinate 100 Mg Tabs (Sumatriptan succinate) .[name].. 1 by mouth every other day as needed 14)  Flexeril 10 Mg Tabs (Cyclobenzaprine hcl) .... As directed 15)  Cymbalta 60 Mg Cpep (Duloxetine hcl) .[name].. 1 by mouth once daily for depression  Patient Instructions: 1)  Please schedule a follow-up appointment in 1 month. 2)  To work  09/29/09 if ok. 3)  Call if you are not better in a reasonable amount of time or if worse.  Prescriptions: CYMBALTA 60 MG CPEP (DULOXETINE HCL) 1 by mouth once daily for depression  #30 x 6   Entered and Authorized by:   Tresa Garter MD   Signed by:   Tresa Garter MD on 08/31/2009   Method used:   Print then Give to Patient   RxID:   [id number]

## 2010-02-27 NOTE — Assessment & Plan Note (Signed)
Summary: hospital f/u - still feeling bad / SD   Vital Signs:  Patient profile:   40 year old female Height:      66 inches Weight:      205 pounds BMI:     33.21 O2 Sat:      97 % on Room air Temp:     97.9 degrees F oral Pulse rate:   100 / minute BP sitting:   100 / 60  (left arm) Cuff size:   large  Vitals Entered By: Lucious Groves (May 02, 2009 2:00 PM)  O2 Flow:  Room air CC: Hosp f/u--Cellulitis. Pt c/o nausea, weakness, and right earache./kb Is Patient Diabetic? No Pain Assessment Patient in pain? yes     Location: under arm Onset of pain  cellulitis development from abscess Comments Patient needs 90 day s scripts for Vesicare and Hyzaar sent to [facility]. Patient notes she is on her 4th day of not smoking./kb   CC:  Hosp f/u--Cellulitis. Pt c/o nausea, weakness, and and right earache./kb.  History of Present Illness: The patient presents for a post-hospital visit for  R axilla abscess She threw up this am C/o rash around wound C/o bad fatigue   Current Medications (verified): 1)  Xanax 0.5 Mg  Tabs (Alprazolam) .... Take 1 Tablet By Mouth Two Times A Day (Office Visit Needed For Refills) 2)  Temazepam 15 Mg Caps (Temazepam) .[name].. 1po At Bedtime As Needed 3)  Proair Hfa 108 (90 Base) Mcg/act  Aers (Albuterol Sulfate) .... 2 Inh Q4h As Needed Shortness of Breath 4)  Vesicare 5 Mg Tabs (Solifenacin Succinate) .[name].. 1 By Mouth Qd 5)  Vitamin D3 1000 Unit  Tabs (Cholecalciferol) .... 2 Qd 6)  Mobic 15 Mg Tabs (Meloxicam) .[name].. 1 Daily 7)  Vyvanse 50 Mg Caps (Lisdexamfetamine Dimesylate) .[name].. 1 By Mouth Qam. Please,  Fill On or After  02/05/09       . Ok To Fill 1 Day Earlier When The Month Has 31 Days in It. 8)  Triamcinolone Acetonide 0.1 % Oint (Triamcinolone Acetonide) .... Use 1-2 Times A Day 9)  Hyzaar 100-25 Mg Tabs (Losartan Potassium-Hctz) .[name].. 1 By Mouth Qd 10)  Paroxetine Hcl 10 Mg Tabs (Paroxetine Hcl) .[name].. 1 By Mouth Once Daily For Depression 11)   Promethazine-Codeine 6.25-10 Mg/40ml Syrp (Promethazine-Codeine) .... 5-10 Ml By Mouth Q Id As Needed Cough 12)  Adderall 10 Mg Tabs (Amphetamine-Dextroamphetamine) .[name].. 1 By Mouth Two Times A Day (Am and Lunch) 13)  Mupirocin 2 % Oint (Mupirocin) .... Use Two Times A Day W/dressing Change 14)  Hydrocodone-Acetaminophen 5-325 Mg Tabs (Hydrocodone-Acetaminophen) .[name].. 1-2 By Mouth Two Times A Day As Needed Pain 15)  Bactrim 400-80 Mg Tabs (Sulfamethoxazole-Trimethoprim) .... 2 Qd  Allergies (verified): 1)  ! Amoxicillin (Amoxicillin) 2)  ! Lisinopril (Lisinopril) 3)  ! Hydrochlorothiazide (Hydrochlorothiazide) 4)  Doxycycline  Past History:  Social History: Last updated: 07/27/2008 Occupation: Married 2 kids Current Smoker 1 ppd 2010  Past Medical History: Anxiety Asthma Depression Hypertension INSOMNIA Gyn Dr Barnabas Lister FAMILY  H/0 DM2   V18.0 Urinary incontinence Vit D def ADD Vit D def H/o boils MRSA abscess 2010  Past Surgical History: Hysterectomy partial  07 Tonsillectomy  04-29-00 ABD [place]  10-12-97 I&D R axilla MRSA abscess Dr Purnell Shoemaker 2011  Family History: Reviewed history from 07/27/2008 and no changes required. Family History of Asthma Family History of CAD Female 1st degree relative <50 Family History Diabetes 1st degree relative M MS and breast  CA  Social History: Reviewed history from 07/27/2008 and no changes required. Occupation: Married 2 kids Current Smoker 1 ppd 2010  Review of Systems  The patient denies fever, chest pain, dyspnea on exertion, and abdominal pain.    Physical Exam  General:  NAD, overweight-appearing.   Ears:  External ear exam shows no significant lesions or deformities.  Otoscopic examination reveals clear canals, tympanic membranes are intact bilaterally without bulging, retraction, inflammation or discharge. Hearing is grossly normal bilaterally. Nose:  External nasal examination shows no deformity or inflammation. Nasal  mucosa are pink and moist without lesions or exudates. Mouth:  not dry mucous membranes Lungs:  CTA Heart:  RRR Abdomen:  S/NT Msk:  No deformity or scoliosis noted of thoracic or lumbar spine.   Skin:  1.5x1 cm granulating wound R  axilla w/surrounding dermatitis - 10 cm; no infiltrate; no pus Cervical Nodes:  No lymphadenopathy noted Axillary Nodes:  No palpable lymphadenopathy Psych:  Oriented X3, normally interactive, good eye contact, not depressed appearing, and not suicidal.     Impression & Recommendations:  Problem # 1:  ABSCESS, AXILLA (ICD-682.3) Assessment Improved Hosp d/c reviewed Her updated medication list for this problem includes:    Bactrim 400-80 Mg Tabs (Sulfamethoxazole-trimethoprim) .[name]... 2 once daily - ok to d/c FMLA filled out  Problem # 2:  VOMITING (ICD-787.03) Assessment: New D/c Bactrim  Problem # 3:  SKIN RASH (ICD-782.1) contact dermatitis due to tape Assessment: New  Her updated medication list for this problem includes:    Triamcinolone Acetonide 0.1 % Oint (Triamcinolone acetonide) ..... Use 1-2 times a day  Problem # 4:  FATIGUE (ICD-780.79) Assessment: Deteriorated To work 4/11 if ok  Complete Medication List: 1)  Xanax 0.5 Mg Tabs (Alprazolam) .... Take 1 tablet by mouth two times a day (office visit needed for refills) 2)  Temazepam 15 Mg Caps (Temazepam) .[name].. 1po at bedtime as needed 3)  Proair Hfa 108 (90 Base) Mcg/act Aers (Albuterol sulfate) .... 2 inh q4h as needed shortness of breath 4)  Vesicare 5 Mg Tabs (Solifenacin succinate) .[name].. 1 by mouth qd 5)  Vitamin D3 1000 Unit Tabs (Cholecalciferol) .... 2 qd 6)  Mobic 15 Mg Tabs (Meloxicam) .[name].. 1 daily 7)  Vyvanse 50 Mg Caps (Lisdexamfetamine dimesylate) .[name].. 1 by mouth qam. please,  fill on or after  02/05/09       . ok to fill 1 day earlier when the month has 31 days in it. 8)  Triamcinolone Acetonide 0.1 % Oint (Triamcinolone acetonide) .... Use 1-2 times a day 9)  Hyzaar 100-25  Mg Tabs (Losartan potassium-hctz) .[name].. 1 by mouth qd 10)  Paroxetine Hcl 10 Mg Tabs (Paroxetine hcl) .[name].. 1 by mouth once daily for depression 11)  Promethazine-codeine 6.25-10 Mg/110ml Syrp (Promethazine-codeine) .... 5-10 ml by mouth q id as needed cough 12)  Adderall 10 Mg Tabs (Amphetamine-dextroamphetamine) .[name].. 1 by mouth two times a day (am and lunch) 13)  Mupirocin 2 % Oint (Mupirocin) .... Use two times a day w/dressing change 14)  Hydrocodone-acetaminophen 5-325 Mg Tabs (Hydrocodone-acetaminophen) .[name].. 1-2 by mouth two times a day as needed pain 15)  Bactrim 400-80 Mg Tabs (Sulfamethoxazole-trimethoprim) .... 2 qd  Patient Instructions: 1)  Keep return office visit  Prescriptions: HYZAAR 100-25 MG TABS (LOSARTAN POTASSIUM-HCTZ) 1 by mouth qd  #90 x 3   Entered and Authorized by:   Tresa Garter MD   Signed by:   Tresa Garter MD on 05/02/2009  Method used:   Electronically to        [facility]* (mail-order)             ,          Ph: [phone]       Fax: [phone]   RxID:   [id number] VESICARE 5 MG TABS (SOLIFENACIN SUCCINATE) 1 by mouth qd  #90 x 3   Entered and Authorized by:   Tresa Garter MD   Signed by:   Tresa Garter MD on 05/02/2009   Method used:   Electronically to        [facility]* (mail-order)             ,          Ph: [phone]       Fax: [phone]   RxID:   [id number]

## 2010-02-27 NOTE — Letter (Signed)
Summary: [facility]  [facility]   Imported By: Lester Salamanca 09/12/2009 09:01:46  _____________________________________________________________________  External Attachment:    Type:   Image     Comment:   External Document

## 2010-02-27 NOTE — Progress Notes (Signed)
Summary: Rf Vimovo  Phone Note Refill Request Message from:  Fax from Pharmacy  Refills Requested: Medication #1:  VIMOVO 500-20 MG TBEC 1 by mouth once daily - two times a day pc as needed pain   Dosage confirmed as above?Dosage Confirmed   Supply Requested: 90 day supply [facility]   Method Requested: Electronic Initial call taken by: Lanier Prude, [facility]),  October 11, 2009 3:39 PM  Follow-up for Phone Call        ok Follow-up by: Tresa Garter MD,  October 11, 2009 5:05 PM    Prescriptions: VIMOVO 500-20 MG TBEC (NAPROXEN-ESOMEPRAZOLE) 1 by mouth once daily - two times a day pc as needed pain  #180 x 1   Entered by:   Lamar Sprinkles, CMA   Authorized by:   Tresa Garter MD   Signed by:   Lamar Sprinkles, CMA on 10/11/2009   Method used:   Electronically to        [facility]* (retail)             ,          Ph: [phone]       Fax: [phone]   RxID:   [id number]

## 2010-02-27 NOTE — Progress Notes (Signed)
Summary: NEBULIZER  Phone Note Call from Patient Call back at Work Phone [phone]   Summary of Call: Pt says she was offered a nebulizer recently and declined. She has changed her mind and would like rx for neb machine and meds.  Initial call taken by: Lamar Sprinkles, CMA,  Jun 09, 2009 8:47 AM  Follow-up for Phone Call        ok. Ref to [facility] if needed pls Follow-up by: Tresa Garter MD,  Jun 09, 2009 1:06 PM  Additional Follow-up for Phone Call Additional follow up Details #1::        left mess to call office back, need pharm info........Marland KitchenLamar Sprinkles, CMA  Jun 09, 2009 3:39 PM     Additional Follow-up for Phone Call Additional follow up Details #2::    Pt informed  Follow-up by: Lamar Sprinkles, CMA,  Jun 09, 2009 5:33 PM  New/Updated Medications: * NEBULIZER as dirr. Dx asthma * NEBULIZER as dirr. Dx asthma 493.90 ALBUTEROL SULFATE (2.5 MG/3ML) 0.083% NEBU (ALBUTEROL SULFATE) use qid via HHN prn Prescriptions: NEBULIZER as dirr. Dx asthma 493.90  #1 x 0   Entered by:   Lamar Sprinkles, CMA   Authorized by:   Tresa Garter MD   Signed by:   Lamar Sprinkles, CMA on 06/09/2009   Method used:   Faxed to ...       [facility] [address] [phone]* (retail)       [address] [place], [place]  [address]       Ph: [phone]       Fax: [phone]   RxID:   [id number] ALBUTEROL SULFATE (2.5 MG/3ML) 0.083% NEBU (ALBUTEROL SULFATE) use qid via HHN prn  #120 x 3   Entered by:   Lamar Sprinkles, CMA   Authorized by:   Tresa Garter MD   Signed by:   Lamar Sprinkles, CMA on 06/09/2009   Method used:   Faxed to ...       [facility] [address] [phone]* (retail)       [address], [place]  [address]       Ph: [phone]       Fax: [phone]   RxID:   [id number] NEBULIZER as dirr. Dx asthma  #1 x 0   Entered by:   Lamar Sprinkles, CMA   Authorized by:   Tresa Garter MD   Signed by:   Lamar Sprinkles, CMA on 06/09/2009  Method used:   Faxed to ...       [facility] [address] [phone]* (retail)       [address] [place], [place]  [address]       Ph: [phone]       Fax: [phone]   RxID:   [id number] ALBUTEROL SULFATE (2.5 MG/3ML) 0.083% NEBU (ALBUTEROL SULFATE) use qid via HHN prn  #3 mth x 3   Entered by:   Lamar Sprinkles, CMA   Authorized by:   Tresa Garter MD   Signed by:   Lamar Sprinkles, CMA on 06/09/2009   Method used:   Faxed to ...       [facility]* (mail-order)             ,          Ph: [phone]       Fax: [phone]   RxID:  [id number] ALBUTEROL SULFATE (2.5 MG/3ML) 0.083% NEBU (ALBUTEROL SULFATE) use qid via HHN prn  #120 x 11   Entered by:   Lamar Sprinkles, CMA   Authorized by:   Tresa Garter MD   Signed by:   Lamar Sprinkles, CMA on 06/09/2009   Method used:   [facility]        [facility]       [address]       [place], [place]  [address]       Ph: [phone]       Fax: [phone]   RxID:   [id number] NEBULIZER as dirr. Dx asthma  #1 x 0   Entered by:   Lamar Sprinkles, CMA   Authorized by:   Tresa Garter MD   Signed by:   Lamar Sprinkles, CMA on 06/09/2009   Method used:   Faxed to ...       [facility]       [address]       [place], [place]  [address]       Ph: [phone]       Fax: [phone]   RxID:   [id number]

## 2010-02-27 NOTE — Assessment & Plan Note (Signed)
Summary: boil under right arm pit-lb   Vital Signs:  Patient profile:   40 year old female Weight:      205 pounds Temp:     99.6 degrees F oral Pulse rate:   101 / minute BP sitting:   114 / 80  (left arm)  Vitals Entered By: Tora Perches (April 20, 2009 11:14 AM)  Procedure Note  Incision & Drainage: The patient complains of pain, redness, inflammation, tenderness, and swelling. Indication: infected lesion Consent signed: yes  Procedure # 1: I & D with packing    Size (in cm): 2.1 x 1.6    Location: R axilla    Comment: Risks including but not limited by incomplete procedure, bleeding, infection, recurrence were discussed with the patient. Consent form was signed. 3 cc of blood/pus extracted. Cavity probed bluntely and cleaned. Packed w/2.5" of packing. Tolerated well. Complicatons - none. Good pain relief following the procedure. Dressed w/Telfa and Silvadine.    Instrument used: #11 blade    Anesthesia: 3.0 ml 1% lidocaine w/epinephrine  Cleaned and prepped with: alcohol and betadine Wound dressing: bulky gauze dressing Instructions: daily dressing changes  CC: boil under right arm pit Is Patient Diabetic? No   CC:  boil under right arm pit.  History of Present Illness: C/o R axilla boil x 2-3 d, painful  Preventive Screening-Counseling & Management  Alcohol-Tobacco     Smoking Status: current  Current Medications (verified): 1)  Xanax 0.5 Mg  Tabs (Alprazolam) .... Take 1 Tablet By Mouth Two Times A Day (Office Visit Needed For Refills) 2)  Temazepam 15 Mg Caps (Temazepam) .[name].. 1po At Bedtime As Needed 3)  Proair Hfa 108 (90 Base) Mcg/act  Aers (Albuterol Sulfate) .... 2 Inh Q4h As Needed Shortness of Breath 4)  Vesicare 5 Mg Tabs (Solifenacin Succinate) .[name].. 1 By Mouth Qd 5)  Vitamin D3 1000 Unit  Tabs (Cholecalciferol) .... 2 Qd 6)  Mobic 15 Mg Tabs (Meloxicam) .[name].. 1 Daily 7)  Vyvanse 50 Mg Caps (Lisdexamfetamine Dimesylate) .[name].. 1 By Mouth Qam. Please,   Fill On or After  02/05/09       . Ok To Fill 1 Day Earlier When The Month Has 31 Days in It. 8)  Triamcinolone Acetonide 0.1 % Oint (Triamcinolone Acetonide) .... Use 1-2 Times A Day 9)  Hyzaar 100-25 Mg Tabs (Losartan Potassium-Hctz) .[name].. 1 By Mouth Qd 10)  Paroxetine Hcl 10 Mg Tabs (Paroxetine Hcl) .[name].. 1 By Mouth Once Daily For Depression 11)  Promethazine-Codeine 6.25-10 Mg/32ml Syrp (Promethazine-Codeine) .... 5-10 Ml By Mouth Q Id As Needed Cough 12)  Diflucan 150 Mg Tabs (Fluconazole) .[name].. 1 By Mouth Once Daily Once For Yeast Infection 13)  Adderall 10 Mg Tabs (Amphetamine-Dextroamphetamine) .[name].. 1 By Mouth Two Times A Day (Am and Lunch)  Allergies: 1)  ! Amoxicillin (Amoxicillin) 2)  ! Lisinopril (Lisinopril) 3)  ! Hydrochlorothiazide (Hydrochlorothiazide)  Past History:  Past Medical History: Anxiety Asthma Depression Hypertension INSOMNIA Gyn Dr Barnabas Lister FAMILY  H/0 DM2   V18.0 Urinary incontinence Vit D def ADD Vit D def H/o boils  Social History: Smoking Status:  current  Review of Systems       The patient complains of fever.    Physical Exam  General:  NAD, overweight-appearing.   Lungs:  CTA Heart:  RRR Skin:  2x1.5 cm subcutaneouse nodule w/erythema and pain in R axilla   Impression & Recommendations:  Problem # 1:  ABSCESS, AXILLA (ICD-682.3) Assessment New  Options to  treat discussd: will I&D Hydrocodone for pain Her updated medication list for this problem includes:    Doxycycline Hyclate 100 Mg Caps (Doxycycline hyclate) .[name]... 1 by mouth two times a day with a glass of water  Orders: I&D Abscess, Complex (10061)  Complete Medication List: 1)  Xanax 0.5 Mg Tabs (Alprazolam) .... Take 1 tablet by mouth two times a day (office visit needed for refills) 2)  Temazepam 15 Mg Caps (Temazepam) .[name].. 1po at bedtime as needed 3)  Proair Hfa 108 (90 Base) Mcg/act Aers (Albuterol sulfate) .... 2 inh q4h as needed shortness of breath 4)  Vesicare 5  Mg Tabs (Solifenacin succinate) .[name].. 1 by mouth qd 5)  Vitamin D3 1000 Unit Tabs (Cholecalciferol) .... 2 qd 6)  Mobic 15 Mg Tabs (Meloxicam) .[name].. 1 daily 7)  Vyvanse 50 Mg Caps (Lisdexamfetamine dimesylate) .[name].. 1 by mouth qam. please,  fill on or after  02/05/09       . ok to fill 1 day earlier when the month has 31 days in it. 8)  Triamcinolone Acetonide 0.1 % Oint (Triamcinolone acetonide) .... Use 1-2 times a day 9)  Hyzaar 100-25 Mg Tabs (Losartan potassium-hctz) .[name].. 1 by mouth qd 10)  Paroxetine Hcl 10 Mg Tabs (Paroxetine hcl) .[name].. 1 by mouth once daily for depression 11)  Promethazine-codeine 6.25-10 Mg/4ml Syrp (Promethazine-codeine) .... 5-10 ml by mouth q id as needed cough 12)  Adderall 10 Mg Tabs (Amphetamine-dextroamphetamine) .[name].. 1 by mouth two times a day (am and lunch) 13)  Doxycycline Hyclate 100 Mg Caps (Doxycycline hyclate) .[name].. 1 by mouth two times a day with a glass of water 14)  Mupirocin 2 % Oint (Mupirocin) .... Use two times a day w/dressing change 15)  Hydrocodone-acetaminophen 5-325 Mg Tabs (Hydrocodone-acetaminophen) .[name].. 1-2 by mouth two times a day as needed pain  Patient Instructions: 1)  Call if you are not better in a reasonable amount of time or if worse.  2)  Dressing change as we discussed.Remove packing gradually 1"/day Prescriptions: MUPIROCIN 2 % OINT (MUPIROCIN) use two times a day w/dressing change  #30 g x 0   Entered and Authorized by:   Tresa Garter MD   Signed by:   Tresa Garter MD on 04/20/2009   Method used:   [facility]        [facility] [id number] (retail[facility]       [address]       [place], [place]  [address]       Ph: [phone] or [phone]       Fax: [phone]   RxID:   [id number] DOXYCYCLINE HYCLATE 100 MG CAPS (DOXYCYCLINE HYCLATE) 1 by mouth two times a day with a glass of water  #20 x 1   Entered and Authorized by:   Tresa Garter MD   Signed by:   Tresa Garter MD on 04/20/2009    Method used:   [facility]        [facility] (retail)       [address]       [place], [place]  [address]       Ph: [phone] or [phone]       Fax: [phone]   RxID:   [id number] HYDROCODONE-ACETAMINOPHEN 5-325 MG TABS (HYDROCODONE-ACETAMINOPHEN) 1-2 by mouth two times a day as needed pain  #30 x 0   Entered and Authorized by:   Tresa Garter MD   Signed by:  Tresa Garter MD on 04/20/2009   Method used:   Print then Give to Patient   RxID:   [id number] MUPIROCIN 2 % OINT (MUPIROCIN) use two times a day w/dressing change  #30 g x 0   Entered and Authorized by:   Tresa Garter MD   Signed by:   Tresa Garter MD on 04/20/2009   Method used:   [facility]        [facility]  [address] [id number]       [address]       [place], [place]  [address]       Ph: [phone] or [phone]       Fax: [phone]   RxID:   [id number] DOXYCYCLINE HYCLATE 100 MG CAPS (DOXYCYCLINE HYCLATE) 1 by mouth two times a day with a glass of water  #20 x 1   Entered and Authorized by:   Tresa Garter MD   Signed by:   Tresa Garter MD on 04/20/2009   Method used:   [facility]        [facility]  [address] [id number]       [address]       [place], [place]  [address]       Ph: [phone] or [phone]       Fax: [phone]   RxID:   [id number]

## 2010-02-27 NOTE — Progress Notes (Signed)
Summary: Rf alprazolam/Plot pt  Phone Note Refill Request Message from:  Fax from Pharmacy  Refills Requested: Medication #1:  XANAX 0.5 MG  TABS Take 1 tablet by mouth two times a day (office visit needed for refills)   Dosage confirmed as above?Dosage Confirmed   Supply Requested: 1 month   Last Refilled: 06/22/2009  Method Requested: Telephone to Pharmacy Initial call taken by: Lanier Prude, [facility]),  July 27, 2009 11:51 AM  Follow-up for Phone Call        OK refill x 1 month - needs OV Follow-up by: Jacques Navy MD,  July 27, 2009 5:28 PM    Prescriptions: Prudy Feeler 0.5 MG  TABS (ALPRAZOLAM) Take 1 tablet by mouth two times a day (office visit needed for refills)  #60 x 1   Entered by:   Lanier Prude, CMA(AAMA)   Authorized by:   Tresa Garter MD   Signed by:   Lanier Prude, CMA(AAMA) on 07/28/2009   Method used:   Telephoned to ...       [facility] ([facility]ail[facility]       [address]       [place], [place]  [address]       Ph: [phone] or [phone]       Fax: [phone]   RxID:   [id number]

## 2010-02-27 NOTE — Progress Notes (Signed)
Summary: Wk Note  Phone Note Call from Patient Call back at [facility] Phone [phone]   Summary of Call: 1. Patient is requesting out of work note for today faxed to her job. (Fax # [phone])  2. She is c/o wheezing and would like an inhaler.  Initial call taken by: Lamar Sprinkles, CMA,  March 07, 2009 8:43 AM  Follow-up for Phone Call        OK note OK MDI Proair Follow-up by: Tresa Garter MD,  March 08, 2009 7:49 AM  Additional Follow-up for Phone Call Additional follow up Details #1::        done. Left message on voicemail notifying patient. Additional Follow-up by: Lucious Groves,  March 08, 2009 9:11 AM    New/Updated Medications: PROAIR HFA 108 (90 BASE) MCG/ACT  AERS (ALBUTEROL SULFATE) 2 inh q4h as needed shortness of breath Prescriptions: PROAIR HFA 108 (90 BASE) MCG/ACT  AERS (ALBUTEROL SULFATE) 2 inh q4h as needed shortness of breath  #1 x 6   Entered and Authorized by:   Tresa Garter MD   Signed by:   Lucious Groves on 03/08/2009   Method used:   [facility]        [facility]  [address] [id number]       [address]       [place], [place]  [address]       Ph: [phone] or [phone]       Fax: [phone]   RxID:   [id number]

## 2010-02-27 NOTE — Assessment & Plan Note (Signed)
Summary: cold symptoms worse-lb   Vital Signs:  Patient profile:   40 year old female Height:      66 inches Weight:      205 pounds BMI:     33.21 Temp:     98.3 degrees F oral Pulse rate:   90 / minute Pulse rhythm:   regular BP sitting:   122 / 90  (left arm) Cuff size:   regular  Vitals Entered By: Lanier Prude, CMA(AAMA) (November 01, 2009 4:10 PM) CC: cough/congestion Is Patient Diabetic? No Comments pt is not taking Lunesta, Ranitidine, Xana, Benadryl or Zolpidem.   She is requesting 90day supply of Klonopin.  She is currently taking Mucinex, Sudafed and using saline rinse  3  CC:  cough/congestion.  History of Present Illness: C/o cough - longer than 2 wks - not better; SOB  Preventive Screening-Counseling & Management  Alcohol-Tobacco     Smoking Status: current     Packs/Day: 0.5  Current Medications (verified): 1)  Xanax 0.5 Mg  Tabs (Alprazolam) .... Take 1 Tablet By Mouth Two Times A Day (Office Visit Needed For Refills) 2)  Proair Hfa 108 (90 Base) Mcg/act  Aers (Albuterol Sulfate) .... 2 Inh Q4h As Needed Shortness of Breath 3)  Vesicare 5 Mg Tabs (Solifenacin Succinate) .[name].. 1 By Mouth Qd 4)  Vitamin D3 1000 Unit  Tabs (Cholecalciferol) .... 2 Qd 5)  Triamcinolone Acetonide 0.1 % Oint (Triamcinolone Acetonide) .... Use 1-2 Times A Day 6)  Hyzaar 100-25 Mg Tabs (Losartan Potassium-Hctz) .[name].. 1 By Mouth Qd 7)  Albuterol Sulfate (2.5 Mg/53ml) 0.083% Nebu (Albuterol Sulfate) .... Use Qid Via Hhn Prn 8)  Benadryl 25 Mg Tabs (Diphenhydramine Hcl) .... As Directed 9)  Zolpidem Tartrate 12.5 Mg Cr-Tabs (Zolpidem Tartrate) .[name].. 1po At Bedtime As Needed 10)  Sumatriptan Succinate 100 Mg Tabs (Sumatriptan Succinate) .[name].. 1 By Mouth Every Other Day As Needed 11)  Flexeril 10 Mg Tabs (Cyclobenzaprine Hcl) .[name].. 1 By Mouth Three Times A Day Prn 12)  Cymbalta 60 Mg Cpep (Duloxetine Hcl) .[name].. 1 By Mouth Once Daily For Depression 13)  Seroquel Xr 150 Mg Xr24h-Tab  (Quetiapine Fumarate) .[name].. 1 Once Daily 14)  Flector 1.3 % Ptch (Diclofenac Epolamine) 15)  Ranitidine Hcl 75 Mg/3ml Syrp (Ranitidine Hcl) .... 2 Once Daily 16)  Vimovo 500-20 Mg Tbec (Naproxen-Esomeprazole) .[name].. 1 By Mouth Once Daily - Two Times A Day Pc As Needed Pain 17)  Vyvanse 40 Mg Caps (Lisdexamfetamine Dimesylate) .[name].. 1 By Mouth Qd 18)  Nebulizer .... As Dirr. Dx Asthma 493.90 19)  Lunesta 3 Mg Tabs (Eszopiclone) .[name].. 1 At Bedtime 20)  Abilify 2 Mg Tabs (Aripiprazole) .[name].. 1 By Mouth By Mouth Qd 21)  Tussionex Pennkinetic Er 8-10 Mg/50ml Lqcr (Chlorpheniramine-Hydrocodone) .... 5 Ml By Mouth Two Times A Day As Needed Cough 22)  Toprol Xl 100 Mg Xr24h-Tab (Metoprolol Succinate) .[name].. 1 By Mouth Bid 23)  Prednisone 10 Mg Tabs (Prednisone) .... Take 40mg  Qd For 3 Days, Then 20 Mg Qd For 3 Days, Then 10mg  Qd For 6 Days, Then Stop. Take Pc.  Allergies (verified): 1)  ! Amoxicillin (Amoxicillin) 2)  ! Lisinopril (Lisinopril) 3)  ! Hydrochlorothiazide (Hydrochlorothiazide) 4)  Doxycycline  Past History:  Past Medical History: Last updated: 10/11/2009 Anxiety Asthma Depression Hypertension INSOMNIA Gyn Dr Barnabas Lister FAMILY  H/0 DM2   V18.0 Urinary incontinence Vit D def ADD Vit D def H/o boils MRSA abscess 2010 GERD 2011 Tachycardia  Social History: Last updated: 08/15/2009  Occupation:[employer];waiting tables 2 d/ wk; back to school Married 2 kids Current Smoker 1 ppd 2010  Physical Exam  General:  alert and overweight-appearing.   Mouth:  Erythematous throat and intranasal mucosa c/w URI  Lungs:  normal respiratory effort and normal breath sounds.   Heart:  Tachy and regular HR 110-120 at rest.  Dizzy when stood up  Abdomen:  soft, non-tender, and normal bowel sounds.     Impression & Recommendations:  Problem # 1:  COUGH (ICD-786.2) Assessment Unchanged As per #2  Problem # 2:  SINUSITIS, ACUTE (ICD-461.9) Assessment: New  Her updated medication list for this  problem includes:    Tussionex Pennkinetic Er 8-10 Mg/39ml Lqcr (Chlorpheniramine-hydrocodone) .Marland KitchenMarland KitchenMarland KitchenMarland Kitchen 5 ml by mouth two times a day as needed cough    Zithromax Z-pak 250 Mg Tabs (Azithromycin) .[name]... As dirrected    Ceftin 500 Mg Tabs (Cefuroxime axetil) .[name]... 1 by mouth bid    Promethazine-codeine 6.25-10 Mg/74ml Syrp (Promethazine-codeine) .[name]... 5-10 ml by mouth q id as needed cough  Problem # 3:  ASTHMA (ICD-493.90) Assessment: Deteriorated  Her updated medication list for this problem includes:    Proair Hfa 108 (90 Base) Mcg/act Aers (Albuterol sulfate) .[name]... 2 inh q4h as needed shortness of breath    Albuterol Sulfate (2.5 Mg/61ml) 0.083% Nebu (Albuterol sulfate) ..... Use qid via hhn prn    Prednisone 10 Mg Tabs (Prednisone) .[name]... Take 40mg  qd for 3 days, then 20 mg qd for 3 days, then 10mg  qd for 6 days, then stop. take pc.    Alvesco 160 Mcg/act Aers (Ciclesonide) .[name]... 1 spr bid  Problem # 4:  SMOKER (ICD-305.1) Assessment: Comment Only  Encouraged smoking cessation and discussed different methods for smoking cessation.   Complete Medication List: 1)  Proair Hfa 108 (90 Base) Mcg/act Aers (Albuterol sulfate) .... 2 inh q4h as needed shortness of breath 2)  Vesicare 5 Mg Tabs (Solifenacin succinate) .[name].. 1 by mouth qd 3)  Vitamin D3 1000 Unit Tabs (Cholecalciferol) .... 2 qd 4)  Triamcinolone Acetonide 0.1 % Oint (Triamcinolone acetonide) .... Use 1-2 times a day 5)  Hyzaar 100-25 Mg Tabs (Losartan potassium-hctz) .[name].. 1 by mouth qd 6)  Albuterol Sulfate (2.5 Mg/93ml) 0.083% Nebu (Albuterol sulfate) .... Use qid via hhn prn 7)  Sumatriptan Succinate 100 Mg Tabs (Sumatriptan succinate) .[name].. 1 by mouth every other day as needed 8)  Flexeril 10 Mg Tabs (Cyclobenzaprine hcl) .[name].. 1 by mouth three times a day prn 9)  Cymbalta 60 Mg Cpep (Duloxetine hcl) .[name].. 1 by mouth once daily for depression 10)  Seroquel Xr 150 Mg Xr24h-tab (Quetiapine fumarate) .[name].. 1 once daily 11)  Flector 1.3 %  Ptch (Diclofenac epolamine) 12)  Vimovo 500-20 Mg Tbec (Naproxen-esomeprazole) .[name].. 1 by mouth once daily - two times a day pc as needed pain 13)  Vyvanse 40 Mg Caps (Lisdexamfetamine dimesylate) .[name].. 1 by mouth qd 14)  Nebulizer  .... As dirr. dx asthma 493.90 15)  Abilify 2 Mg Tabs (Aripiprazole) .[name].. 1 by mouth by mouth qd 16)  Tussionex Pennkinetic Er 8-10 Mg/86ml Lqcr (Chlorpheniramine-hydrocodone) .... 5 ml by mouth two times a day as needed cough 17)  Toprol Xl 100 Mg Xr24h-tab (Metoprolol succinate) .[name].. 1 by mouth bid 18)  Prednisone 10 Mg Tabs (Prednisone) .... Take 40mg  qd for 3 days, then 20 mg qd for 3 days, then 10mg  qd for 6 days, then stop. take pc. 19)  Alvesco 160 Mcg/act Aers (Ciclesonide) .[name].. 1 spr bid 20)  Zithromax Z-pak 250 Mg Tabs (Azithromycin) .... As dirrected 21)  Klonopin 1 Mg Tabs (Clonazepam) .[name].. 1 by mouth tid 22)  Ceftin 500 Mg Tabs (Cefuroxime axetil) .[name].. 1 by mouth bid 23)  Promethazine-codeine 6.25-10 Mg/17ml Syrp (Promethazine-codeine) .... 5-10 ml by mouth q id as needed cough  Patient Instructions: 1)  Call if you are not better in a reasonable amount of time or if worse. Go to ER if feeling really bad!  Prescriptions: ZITHROMAX Z-PAK 250 MG TABS (AZITHROMYCIN) as dirrected  #1 x 0   Entered and Authorized by:   Tresa Garter MD   Signed by:   Tresa Garter MD on 11/01/2009   Method used:   Print then Give to Patient   RxID:   [id number] ALVESCO 160 MCG/ACT AERS (CICLESONIDE) 1 spr bid  #1 x 3   Entered and Authorized by:   Tresa Garter MD   Signed by:   Tresa Garter MD on 11/01/2009   Method used:   Print then Give to Patient   RxID:   [id number]

## 2010-02-27 NOTE — Assessment & Plan Note (Signed)
Summary: 3 MO ROV /NWS #  -- rs'd from bmp list/cd   Vital Signs:  Patient profile:   40 year old female Weight:      214 pounds BMI:     34.67 Temp:     98.8 degrees F oral Pulse rate:   129 / minute BP sitting:   146 / 92  (left arm)  Vitals Entered By: Tora Perches (March 06, 2009 1:14 PM) CC: f/u Is Patient Diabetic? No   CC:  f/u.  History of Present Illness: The patient presents for a follow up of back pain, anxiety and HTN; more stress lately. The patient presents with complaints of sore throat, fever, cough, sinus congestion and drainge of 2 day duration. Not better with OTC meds. Chest hurts with coughing. Can't sleep due to cough. Muscle aches are present.  The mucus is colored.     Preventive Screening-Counseling & Management  Alcohol-Tobacco     Smoking Status: never  Current Medications (verified): 1)  Xanax 0.5 Mg  Tabs (Alprazolam) .... Take 1 Tablet By Mouth Two Times A Day (Office Visit Needed For Refills) 2)  Temazepam 15 Mg Caps (Temazepam) .[name].. 1po At Bedtime As Needed 3)  Proair Hfa 108 (90 Base) Mcg/act  Aers (Albuterol Sulfate) .... 2 Inh Q4h As Needed Shortness of Breath 4)  Vesicare 5 Mg Tabs (Solifenacin Succinate) .[name].. 1 By Mouth Qd 5)  Vitamin D3 1000 Unit  Tabs (Cholecalciferol) .... 2 Qd 6)  Mobic 15 Mg Tabs (Meloxicam) .[name].. 1 Daily 7)  Vyvanse 50 Mg Caps (Lisdexamfetamine Dimesylate) .[name].. 1 By Mouth Qam. Please,  Fill On or After  02/05/09       . Ok To Fill 1 Day Earlier When The Month Has 31 Days in It. 8)  Triamcinolone Acetonide 0.1 % Oint (Triamcinolone Acetonide) .... Use 1-2 Times A Day 9)  Hyzaar 100-25 Mg Tabs (Losartan Potassium-Hctz) .[name].. 1 By Mouth Qd  Allergies: 1)  ! Amoxicillin (Amoxicillin) 2)  ! Lisinopril (Lisinopril) 3)  ! Hydrochlorothiazide (Hydrochlorothiazide)  Past History:  Past Medical History: Last updated: 07/27/2008 Anxiety Asthma Depression Hypertension INSOMNIA Gyn Dr Barnabas Lister FAMILY  H/0 DM2    V18.0 Urinary incontinence Vit D def ADD Vit D def  Social History: Last updated: 07/27/2008 Occupation: Married 2 kids Current Smoker 1 ppd 2010  Social History: Smoking Status:  never  Physical Exam  General:  NAD, overweight-appearing.   Nose:  Erythematous throat mucosa and intranasal erythema.  Lungs:  Normal respiratory effort, chest expands symmetrically. Lungs are clear to auscultation, no crackles or wheezes. Heart:  Normal rate and regular rhythm. S1 and S2 normal without gallop, murmur, click, rub or other extra sounds. Abdomen:  S/NT Neurologic:  alert & oriented X3.   Skin:  as above Psych:  Oriented X3, normally interactive, good eye contact, not depressed appearing, and not suicidal.     Impression & Recommendations:  Problem # 1:  ADD (ICD-314.00) Assessment Improved Vyvance on hold till May - not at school now and wants to take a break from itv  Problem # 2:  DEPRESSION (ICD-311) situational Assessment: Deteriorated  Start Paxil  Problem # 3:  HYPERTENSION (ICD-401.9) Assessment: Unchanged  Her updated medication list for this problem includes:    Hyzaar 100-25 Mg Tabs (Losartan potassium-hctz) .[name]... 1 by mouth qd  Problem # 4:  ANXIETY (ICD-300.00) Assessment: Deteriorated  Her updated medication list for this problem includes:    Xanax 0.5 Mg Tabs (Alprazolam) .[name]... Take  1 tablet by mouth two times a day (office visit needed for refills)    Paroxetine Hcl 10 Mg Tabs (Paroxetine hcl) .[name]... 1 by mouth once daily for depression  Problem # 5:  UPPER RESPIRATORY INFECTION, ACUTE (ICD-465.9) Assessment: New     Promethazine-codeine 6.25-10 Mg/21ml Syrp (Promethazine-codeine) .[name]... 5-10 ml by mouth q id as needed cough  Problem # 6:  VITAMIN D DEFICIENCY (ICD-268.9) Assessment: Comment Only Better compliance encouraged.  The labs were reviewed with the patient.   Complete Medication List: 1)  Xanax 0.5 Mg Tabs (Alprazolam) .... Take 1 tablet by  mouth two times a day (office visit needed for refills) 2)  Temazepam 15 Mg Caps (Temazepam) .[name].. 1po at bedtime as needed 3)  Proair Hfa 108 (90 Base) Mcg/act Aers (Albuterol sulfate) .... 2 inh q4h as needed shortness of breath 4)  Vesicare 5 Mg Tabs (Solifenacin succinate) .[name].. 1 by mouth qd 5)  Vitamin D3 1000 Unit Tabs (Cholecalciferol) .... 2 qd 6)  Mobic 15 Mg Tabs (Meloxicam) .[name].. 1 daily 7)  Vyvanse 50 Mg Caps (Lisdexamfetamine dimesylate) .[name].. 1 by mouth qam. please,  fill on or after  02/05/09       . ok to fill 1 day earlier when the month has 31 days in it. 8)  Triamcinolone Acetonide 0.1 % Oint (Triamcinolone acetonide) .... Use 1-2 times a day 9)  Hyzaar 100-25 Mg Tabs (Losartan potassium-hctz) .[name].. 1 by mouth qd 10)  Paroxetine Hcl 10 Mg Tabs (Paroxetine hcl) .[name].. 1 by mouth once daily for depression 11)  Promethazine-codeine 6.25-10 Mg/61ml Syrp (Promethazine-codeine) .... 5-10 ml by mouth q id as needed cough  Patient Instructions: 1)  Please schedule a follow-up appointment in 3-4 months. Prescriptions: PROMETHAZINE-CODEINE 6.25-10 MG/5ML SYRP (PROMETHAZINE-CODEINE) 5-10 ml by mouth q id as needed cough  #300 ml x 0   Entered and Authorized by:   Tresa Garter MD   Signed by:   Tresa Garter MD on 03/06/2009   Method used:   Print then Give to Patient   RxID:   [id number] PAROXETINE HCL 10 MG TABS (PAROXETINE HCL) 1 by mouth once daily for depression  #30 x 6   Entered and Authorized by:   Tresa Garter MD   Signed by:   Tresa Garter MD on 03/06/2009   Method used:   [facility]  [address] [id number]       [address]       [place], [place]  [address]       Ph: [phone] or [phone]       Fax: [phone]   RxID:   [id number]

## 2010-02-27 NOTE — Assessment & Plan Note (Signed)
Summary: cough/SD   Vital Signs:  Patient profile:   40 year old female Height:      66 inches Weight:      205 pounds BMI:     33.21 Temp:     98.1 degrees F oral Pulse rate:   88 / minute Pulse rhythm:   regular BP sitting:   110 / 64  (left arm) Cuff size:   regular  Vitals Entered By: Lanier Prude, Beverly Gust) (November 03, 2009 3:44 PM)  History of Present Illness: The patient presents with complaints of hoarsness,  cough, sinus congestion and drainge of 3 wks duration. Not better with OTC meds. Chest hurts with coughing. Can't sleep due to cough..  The mucus is colored.   Preventive Screening-Counseling & Management  Alcohol-Tobacco     Smoking Cessation Counseling: yes  Current Medications (verified): 1)  Proair Hfa 108 (90 Base) Mcg/act  Aers (Albuterol Sulfate) .... 2 Inh Q4h As Needed Shortness of Breath 2)  Vesicare 5 Mg Tabs (Solifenacin Succinate) .[name].. 1 By Mouth Qd 3)  Vitamin D3 1000 Unit  Tabs (Cholecalciferol) .... 2 Qd 4)  Triamcinolone Acetonide 0.1 % Oint (Triamcinolone Acetonide) .... Use 1-2 Times A Day 5)  Hyzaar 100-25 Mg Tabs (Losartan Potassium-Hctz) .[name].. 1 By Mouth Qd 6)  Albuterol Sulfate (2.5 Mg/76ml) 0.083% Nebu (Albuterol Sulfate) .... Use Qid Via Hhn Prn 7)  Sumatriptan Succinate 100 Mg Tabs (Sumatriptan Succinate) .[name].. 1 By Mouth Every Other Day As Needed 8)  Flexeril 10 Mg Tabs (Cyclobenzaprine Hcl) .[name].. 1 By Mouth Three Times A Day Prn 9)  Cymbalta 60 Mg Cpep (Duloxetine Hcl) .[name].. 1 By Mouth Once Daily For Depression 10)  Seroquel Xr 150 Mg Xr24h-Tab (Quetiapine Fumarate) .[name].. 1 Once Daily 11)  Flector 1.3 % Ptch (Diclofenac Epolamine) 12)  Vimovo 500-20 Mg Tbec (Naproxen-Esomeprazole) .[name].. 1 By Mouth Once Daily - Two Times A Day Pc As Needed Pain 13)  Vyvanse 40 Mg Caps (Lisdexamfetamine Dimesylate) .[name].. 1 By Mouth Qd 14)  Nebulizer .... As Dirr. Dx Asthma 493.90 15)  Abilify 2 Mg Tabs (Aripiprazole) .[name].. 1 By Mouth By Mouth Qd 16)  Tussionex  Pennkinetic Er 8-10 Mg/53ml Lqcr (Chlorpheniramine-Hydrocodone) .... 5 Ml By Mouth Two Times A Day As Needed Cough 17)  Toprol Xl 100 Mg Xr24h-Tab (Metoprolol Succinate) .[name].. 1 By Mouth Bid 18)  Prednisone 10 Mg Tabs (Prednisone) .... Take 40mg  Qd For 3 Days, Then 20 Mg Qd For 3 Days, Then 10mg  Qd For 6 Days, Then Stop. Take Pc. 19)  Alvesco 160 Mcg/act Aers (Ciclesonide) .[name].. 1 Spr Bid 20)  Zithromax Z-Pak 250 Mg Tabs (Azithromycin) .... As Dirrected 21)  Klonopin 1 Mg Tabs (Clonazepam) .[name].. 1 By Mouth Tid  Allergies (verified): 1)  ! Amoxicillin (Amoxicillin) 2)  ! Lisinopril (Lisinopril) 3)  ! Hydrochlorothiazide (Hydrochlorothiazide) 4)  Doxycycline  Past History:  Past Medical History: Last updated: 10/11/2009 Anxiety Asthma Depression Hypertension INSOMNIA Gyn Dr Barnabas Lister FAMILY  H/0 DM2   V18.0 Urinary incontinence Vit D def ADD Vit D def H/o boils MRSA abscess 2010 GERD 2011 Tachycardia  Social History: Last updated: 08/15/2009 Occupation:HR;waiting tables 2 d/ wk; back to school Married 2 kids Current Smoker 1 ppd 2010  Review of Systems       The patient complains of hoarseness, chest pain, syncope, and dyspnea on exertion.  The patient denies fever.    Physical Exam  General:  alert and overweight-appearing.   Mouth:  no gingival abnormalities and pharynx  erythematous  and moist.   Lungs:  normal respiratory effort and normal breath sounds.   Heart:  Tachy and regular HR 110-120 at rest.  Abdomen:  soft, non-tender, and normal bowel sounds.   Extremities:  no edema, no erythema  Neurologic:  cranial nerves II-XII intact and strength normal in all extremities.  Leg shaking when sitting. H-P (-) B Skin:  color normal and no rashes.   Psych:    Oriented X3, not suicidal, not homicidal, depressed affect and anxious.     Impression & Recommendations:  Problem # 1:  COUGH (ICD-786.2) Assessment Deteriorated  Orders: T-2 View CXR, Same Day  (71020.5TC) T-2 View CXR (71020TC)  Problem # 2:  UPPER RESPIRATORY INFECTION, ACUTE (ICD-465.9) Assessment: Deteriorated  Her updated medication list for this problem includes:    Vimovo 500-20 Mg Tbec (Naproxen-esomeprazole) .[name]... 1 by mouth once daily - two times a day pc as needed pain    Tussionex Pennkinetic Er 8-10 Mg/84ml Lqcr (Chlorpheniramine-hydrocodone) .Marland KitchenMarland KitchenMarland KitchenMarland Kitchen 5 ml by mouth two times a day as needed cough    Promethazine-codeine 6.25-10 Mg/16ml Syrp (Promethazine-codeine) .[name]... 5-10 ml by mouth q id as needed cough  Orders: T-2 View CXR, Same Day (71020.5TC)  Problem # 3:  SMOKER (ICD-305.1) 1ppd Assessment: Comment Only Stop smoking today!!!  Problem # 4:  ASTHMA (ICD-493.90) Assessment: Deteriorated Depo 120 mg Her updated medication list for this problem includes:    Proair Hfa 108 (90 Base) Mcg/act Aers (Albuterol sulfate) .[name]... 2 inh q4h as needed shortness of breath    Albuterol Sulfate (2.5 Mg/16ml) 0.083% Nebu (Albuterol sulfate) ..... Use qid via hhn prn    Prednisone 10 Mg Tabs (Prednisone) .[name]... Take 40mg  qd for 3 days, then 20 mg qd for 3 days, then 10mg  qd for 6 days, then stop. take pc.    Alvesco 160 Mcg/act Aers (Ciclesonide) .[name]... 1 spr bid  Complete Medication List: 1)  Proair Hfa 108 (90 Base) Mcg/act Aers (Albuterol sulfate) .... 2 inh q4h as needed shortness of breath 2)  Vesicare 5 Mg Tabs (Solifenacin succinate) .[name].. 1 by mouth qd 3)  Vitamin D3 1000 Unit Tabs (Cholecalciferol) .... 2 qd 4)  Triamcinolone Acetonide 0.1 % Oint (Triamcinolone acetonide) .... Use 1-2 times a day 5)  Hyzaar 100-25 Mg Tabs (Losartan potassium-hctz) .[name].. 1 by mouth qd 6)  Albuterol Sulfate (2.5 Mg/35ml) 0.083% Nebu (Albuterol sulfate) .... Use qid via hhn prn 7)  Sumatriptan Succinate 100 Mg Tabs (Sumatriptan succinate) .[name].. 1 by mouth every other day as needed 8)  Flexeril 10 Mg Tabs (Cyclobenzaprine hcl) .[name].. 1 by mouth three times a day prn 9)  Cymbalta 60 Mg Cpep  (Duloxetine hcl) .[name].. 1 by mouth once daily for depression 10)  Seroquel Xr 150 Mg Xr24h-tab (Quetiapine fumarate) .[name].. 1 once daily 11)  Flector 1.3 % Ptch (Diclofenac epolamine) 12)  Vimovo 500-20 Mg Tbec (Naproxen-esomeprazole) .[name].. 1 by mouth once daily - two times a day pc as needed pain 13)  Vyvanse 40 Mg Caps (Lisdexamfetamine dimesylate) .[name].. 1 by mouth qd 14)  Nebulizer  .... As dirr. dx asthma 493.90 15)  Abilify 2 Mg Tabs (Aripiprazole) .[name].. 1 by mouth by mouth qd 16)  Tussionex Pennkinetic Er 8-10 Mg/52ml Lqcr (Chlorpheniramine-hydrocodone) .... 5 ml by mouth two times a day as needed cough 17)  Toprol Xl 100 Mg Xr24h-tab (Metoprolol succinate) .[name].. 1 by mouth bid 18)  Prednisone 10 Mg Tabs (Prednisone) .... Take 40mg  qd for 3 days, then 20 mg  qd for 3 days, then 10mg  qd for 6 days, then stop. take pc. 19)  Alvesco 160 Mcg/act Aers (Ciclesonide) .[name].. 1 spr bid 20)  Zithromax Z-pak 250 Mg Tabs (Azithromycin) .... As dirrected 21)  Klonopin 1 Mg Tabs (Clonazepam) .[name].. 1 by mouth tid 22)  Ceftin 500 Mg Tabs (Cefuroxime axetil) .[name].. 1 by mouth bid 23)  Promethazine-codeine 6.25-10 Mg/30ml Syrp (Promethazine-codeine) .... 5-10 ml by mouth q id as needed cough  Other Orders: Depo- Medrol 40mg  (J1030) Admin of Therapeutic Inj  intramuscular or subcutaneous (16109)  Patient Instructions: 1)  Call if you are not better in a reasonable amount of time or if worse. Go to ER if feeling really bad!  2)  Tobacco is very bad for your health and your loved ones! You Should stop smoking now!. Prescriptions: PROMETHAZINE-CODEINE 6.25-10 MG/5ML SYRP (PROMETHAZINE-CODEINE) 5-10 ml by mouth q id as needed cough  #300 ml x 0   Entered and Authorized by:   Tresa Garter MD   Signed by:   Tresa Garter MD on 11/03/2009   Method used:   Print then Give to Patient   RxID:   [id number] CEFTIN 500 MG TABS (CEFUROXIME AXETIL) 1 by mouth bid  #20 x 1   Entered and Authorized by:   Tresa Garter MD   Signed by:   Tresa Garter MD on 11/03/2009   Method used:   [facility]  [address] [id number]       [address]       [place], [place]  [address]       Ph: [phone] or [phone]       Fax: [phone]   RxID:   [id number]    Medication Administration  Injection # 1:    Medication: Depo- Medrol 40mg     Diagnosis: SINUSITIS, ACUTE (ICD-461.9)    Route: IM    Site: LUOQ gluteus    Exp Date: 04/28/2012    Lot #: [id number]    Mfr: [employer]    Comments: pt rec 120mg     Patient tolerated injection without complications    Given by: Lanier Prude, CMA(AAMA) (November 03, 2009 3:57 PM)  Orders Added: 1)  T-2 View CXR, Same Day [71020.5TC] 2)  Depo- Medrol 40mg  [J1030] 3)  Admin of Therapeutic Inj  intramuscular or subcutaneous [96372] 4)  T-2 View CXR [71020TC] 5)  Est. Patient Level IV [18841]

## 2010-02-27 NOTE — Letter (Signed)
Summary: Out of Work  [facility]  [address] [place], [place] [address]   Phone: [phone]  Fax: [phone]    August 15, 2009   Employee:  SHUREE BROSSART    To Whom It May Concern:   For Medical reasons, please excuse the above named employee from work for the following dates:  Start:   08/11/09  End:   09/11/09  If you need additional information, please feel free to contact our office.         Sincerely,    Tresa Garter MD

## 2010-02-27 NOTE — Progress Notes (Signed)
Summary: note request  Phone Note Call from Patient Call back at Home Phone [phone]   Summary of Call: Patient left message on triage that she will be d/c from hosp today and needs a note from her PCP in order to return to work on Mon. Is it ok to provide note? Please advise. Initial call taken by: Lucious Groves,  April 26, 2009 1:41 PM  Follow-up for Phone Call        Sure.  Sorry she had to go to the hospital!  Thx Follow-up by: Tresa Garter MD,  April 26, 2009 5:27 PM  Additional Follow-up for Phone Call Additional follow up Details #1::        [facility] for pt to call back.Alvy Beal Archie CMA  April 27, 2009 4:46 PM     Additional Follow-up for Phone Call Additional follow up Details #2::    Pt does not need note, recived one from hospital. Follow-up by: Lamar Sprinkles, CMA,  April 27, 2009 5:29 PM

## 2010-02-27 NOTE — Progress Notes (Signed)
Summary: med refill  Phone Note Refill Request Message from:  Patient on December 28, 2009 10:35 AM  Refills Requested: Medication #1:  VYVANSE 40 MG CAPS 1 by mouth qd   Patient called [facility] requesting rx for vyvanse mailed to her. It this ok?  Initial call taken by: Rock Nephew CMA,  December 28, 2009 10:35 AM  Follow-up for Phone Call        ok to ref Follow-up by: Tresa Garter MD,  December 28, 2009 12:59 PM  Additional Follow-up for Phone Call Additional follow up Details #1::        Rx ready, Pt informed, mailed to pts home Additional Follow-up by: Lamar Sprinkles, CMA,  December 29, 2009 10:19 AM    Prescriptions: VYVANSE 40 MG CAPS (LISDEXAMFETAMINE DIMESYLATE) 1 by mouth qd  #30 x 0   Entered by:   Lamar Sprinkles, CMA   Authorized by:   Tresa Garter MD   Signed by:   Lamar Sprinkles, CMA on 12/29/2009   Method used:   Print then Give to Patient   RxID:   [id number]

## 2010-02-27 NOTE — Progress Notes (Signed)
Summary: xanax  Phone Note Other Incoming Call back at fax   [phone] Details for Reason: refill on alprazolam Details of Action Taken: ok x 5 refills  will fax to pharmacy/vg Initial call taken by: Tora Perches,  February 03, 2009 8:41 AM    Prescriptions: Prudy Feeler 0.5 MG  TABS (ALPRAZOLAM) Take 1 tablet by mouth two times a day (office visit needed for refills)  #60 x 5   Entered by:   Tora Perches   Authorized by:   Tresa Garter MD   Signed by:   Tora Perches on 02/03/2009   Method used:   Telephoned to ...       [facility]       [address]       [place], [place]  [address]       Ph: [phone] or [phone]       Fax: [phone]   RxID:   [id number]

## 2010-02-27 NOTE — Assessment & Plan Note (Signed)
Summary: ANXIETY-DEPRESSION-FMLA FORM--STC   Vital Signs:  Patient profile:   39 year old female Height:      66 inches (167.64 cm) Weight:      209 pounds (95.00 kg) BMI:     33.86 O2 Sat:      96 % on Room air Temp:     98.1 degrees F (36.72 degrees C) oral Pulse rate:   123 / minute Pulse rhythm:   regular Resp:     16 per minute BP sitting:   114 / 80  (left arm) Cuff size:   large  Vitals Entered By: Lanier Prude, CMA(AAMA) (August 15, 2009 8:19 AM)  O2 Flow:  Room air CC: increased anxiety/depression.  Is Patient Diabetic? No Comments Requesting to be OOW/has FMLA papers to be completed.   CC:  increased anxiety/depression. [name]  History of Present Illness: The patient presents for a follow up of neck pain, anxiety, depression and headaches. She is worse  -  very  nervouse and stressed; can't sleep well, tired x 1 -2 months    Current Medications (verified): 1)  Xanax 0.5 Mg  Tabs (Alprazolam) .... Take 1 Tablet By Mouth Two Times A Day (Office Visit Needed For Refills) 2)  Proair Hfa 108 (90 Base) Mcg/act  Aers (Albuterol Sulfate) .... 2 Inh Q4h As Needed Shortness of Breath 3)  Vesicare 5 Mg Tabs (Solifenacin Succinate) .[name].. 1 By Mouth Qd 4)  Vitamin D3 1000 Unit  Tabs (Cholecalciferol) .... 2 Qd 5)  Mobic 15 Mg Tabs (Meloxicam) .[name].. 1 Daily 6)  Vyvanse 50 Mg Caps (Lisdexamfetamine Dimesylate) .[name].. 1 By Mouth Qam. Please,  Fill On or After  02/05/09       . Ok To Fill 1 Day Earlier When The Month Has 31 Days in It. 7)  Triamcinolone Acetonide 0.1 % Oint (Triamcinolone Acetonide) .... Use 1-2 Times A Day 8)  Hyzaar 100-25 Mg Tabs (Losartan Potassium-Hctz) .[name].. 1 By Mouth Qd 9)  Nebulizer .... As Dirr. Dx Asthma 493.90 10)  Albuterol Sulfate (2.5 Mg/54ml) 0.083% Nebu (Albuterol Sulfate) .... Use Qid Via Hhn Prn 11)  Benadryl 25 Mg Tabs (Diphenhydramine Hcl) .... As Directed 12)  Lexapro 10 Mg Tabs (Escitalopram Oxalate) .[name].. 1po Once Daily 13)  Zolpidem Tartrate 12.5 Mg  Cr-Tabs (Zolpidem Tartrate) .[name].. 1po At Bedtime As Needed 14)  Sumatriptan Succinate 100 Mg Tabs (Sumatriptan Succinate) .[name].. 1 By Mouth Every Other Day As Needed  Allergies (verified): 1)  ! Amoxicillin (Amoxicillin) 2)  ! Lisinopril (Lisinopril) 3)  ! 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  The patient denies weight loss, weight gain, chest pain, dyspnea on exertion, and abdominal pain.    Physical Exam  General:  alert and overweight-appearing.   Eyes:  vision grossly intact, pupils equal, and pupils round.   Nose:  no external deformity and no nasal discharge.   Mouth:  no gingival abnormalities and pharynx pink and moist.   Neck:  supple and no masses.   Lungs:  normal respiratory effort and normal breath sounds.   Heart:  normal rate and regular rhythm.   Abdomen:  soft, non-tender, and normal bowel sounds.   Msk:  somewhat puffy bilat MCP's;  no other signifcant joint tender or swelling Extremities:  no edema, no erythema  Neurologic:  cranial nerves II-XII intact and strength normal in all extremities.   Skin:  color normal and no rashes.   Psych:    Oriented X3, not suicidal, not homicidal,  depressed affect, tearful, and moderately anxious.     Impression & Recommendations:  Problem # 1:  ANXIETY (ICD-300.00) Assessment Deteriorated  The following medications were removed from the medication list:    Lexapro 10 Mg Tabs (Escitalopram oxalate) .[name]... 1po once daily Her updated medication list for this problem includes:    Xanax 0.5 Mg Tabs (Alprazolam) .[name]... Take 1 tablet by mouth two times a day (office visit needed for refills)    Paroxetine Hcl 20 Mg Tabs (Paroxetine hcl) .[name]... 1 by mouth qd  Orders: Psychology Referral (Psychology)  Problem # 2:  STRESS DISORDER (ICD-V62.89) Assessment: Deteriorated Forms were filled out. The office visit took longer than 45 min with patient councelling/forms for more than 50% of the 45 min  Orders: Psychology Referral (Psychology)  Problem # 3:  DEPRESSION (ICD-311) Assessment: Deteriorated  The following medications were removed from the medication list:    Lexapro 10 Mg Tabs (Escitalopram oxalate) .[name]... 1po once daily Her updated medication list for this problem includes:    Xanax 0.5 Mg Tabs (Alprazolam) .[name]... Take 1 tablet by mouth two times a day (office visit needed for refills)    Paroxetine Hcl 20 Mg Tabs (Paroxetine hcl) .[name]... 1 by mouth qd  Problem # 4:  HYPERTENSION (ICD-401.9) Assessment: Unchanged  Her updated medication list for this problem includes:    Hyzaar 100-25 Mg Tabs (Losartan potassium-hctz) .[name]... 1 by mouth qd  BP today: 114/80 Prior BP: 112/78 (08/10/2009)  Labs Reviewed: K+: 3.7 (03/01/2009) Creat: : 0.6 (03/01/2009)     Complete Medication List: 1)  Xanax 0.5 Mg Tabs (Alprazolam) .... Take 1 tablet by mouth two times a day (office visit needed for refills) 2)  Proair Hfa 108 (90 Base) Mcg/act Aers (Albuterol sulfate) .... 2 inh q4h as needed shortness of breath 3)  Vesicare 5 Mg Tabs (Solifenacin succinate) .[name].. 1 by mouth qd 4)  Vitamin D3 1000 Unit Tabs (Cholecalciferol) .... 2 qd 5)   Mobic 15 Mg Tabs (Meloxicam) .[name].. 1 daily 6)  Vyvanse 50 Mg Caps (Lisdexamfetamine dimesylate) .[name].. 1 by mouth qam. please,  fill on or after  02/05/09       . ok to fill 1 day earlier when the month has 31 days in it. 7)  Triamcinolone Acetonide 0.1 % Oint (Triamcinolone acetonide) .... Use 1-2 times a day 8)  Hyzaar 100-25 Mg Tabs (Losartan potassium-hctz) .[name].. 1 by mouth qd 9)  Nebulizer  .... As dirr. dx asthma 493.90 10)  Albuterol Sulfate (2.5 Mg/65ml) 0.083% Nebu (Albuterol sulfate) .... Use qid via hhn prn 11)  Benadryl 25 Mg Tabs (Diphenhydramine hcl) .... As directed 12)  Zolpidem Tartrate 12.5 Mg Cr-tabs (Zolpidem tartrate) .[name].. 1po at bedtime as needed 13)  Sumatriptan Succinate 100 Mg  Tabs (Sumatriptan succinate) .[name].. 1 by mouth every other day as needed 14)  Paroxetine Hcl 20 Mg Tabs (Paroxetine hcl) .[name].. 1 by mouth qd  Patient Instructions: 1)  Please schedule a follow-up appointment in 2 weeks. Prescriptions: PAROXETINE HCL 20 MG TABS (PAROXETINE HCL) 1 by mouth qd  #30 x 6   Entered and Authorized by:   Tresa Garter MD   Signed by:   Tresa Garter MD on 08/15/2009   Method used:   [facility]        [facility] [facility]       [address]       [place], [place]  [address]       Ph: [phone] or [phone]       Fax: [phone]   RxID:   [id number]

## 2010-02-27 NOTE — Progress Notes (Signed)
  Phone Note Refill Request Message from:  Fax from Pharmacy on Jun 23, 2009 9:14 AM  Refills Requested: Medication #1:  TEMAZEPAM 15 MG CAPS 1po at bedtime as needed   Dosage confirmed as above?Dosage Confirmed   Supply Requested: 1 month Initial call taken by: Rock Nephew CMA,  Jun 23, 2009 9:14 AM    Prescriptions: TEMAZEPAM 15 MG CAPS (TEMAZEPAM) 1po at bedtime as needed  #30 x 6   Entered by:   Rock Nephew CMA   Authorized by:   Tresa Garter MD   Signed by:   Rock Nephew CMA on 06/23/2009   Method used:   [facility]ph[facility] ...       [facility]  [address] [id number]       [address]       [place], [place]  [address]       Ph: [phone] or [phone]       Fax: [phone]   RxID:   [id number]

## 2010-02-27 NOTE — Progress Notes (Signed)
Summary: Med ??'s  Phone Note Call from Patient   Caller: Patient Summary of Call: pt is questioning whether she should use additional Rf on Prednisone? She states she feels better but is still coughing so much and hard that she is urinating  on herself.  She also wants to know if you could change her dose of vesicare?  please advise on additional steriod use and vesicare? Initial call taken by: Lanier Prude, [facility]),  November 06, 2009 10:26 AM  Follow-up for Phone Call        OK another steroid course OK Vesicare 10 mg a day Follow-up by: Tresa Garter MD,  November 06, 2009 1:07 PM  Additional Follow-up for Phone Call Additional follow up Details #1::        Patient notified and rx sent in.Alvy Beal Archie CMA  November 06, 2009 1:38 PM     New/Updated Medications: VESICARE 10 MG TABS (SOLIFENACIN SUCCINATE) 1 by mouth qd Prescriptions: PREDNISONE 10 MG TABS (PREDNISONE) Take 40mg  qd for 3 days, then 20 mg qd for 3 days, then 10mg  qd for 6 days, then stop. Take pc.  #24 x 1   Entered by:   Rock Nephew CMA   Authorized by:   Tresa Garter MD   Signed by:   Rock Nephew CMA on 11/06/2009   Method used:   [facility]ron[facility]        [facility]       [address]       [place], [place]  [address]       Ph: [phone]       Fax: [phone]   RxID:   [id number] VESICARE 10 MG TABS (SOLIFENACIN SUCCINATE) 1 by mouth qd  #30 x 11   Entered by:   Rock Nephew CMA   Authorized by:   Tresa Garter MD   Signed by:   Rock Nephew CMA on 11/06/2009   Method used:   Electronically to        [facility]       [address]       [place], [place]  [address]       Ph: [phone]       Fax: [phone]   RxID:   [id number]

## 2010-02-27 NOTE — Letter (Signed)
Summary: [facility]   Imported By: Sherian Rein 06/15/2009 09:41:43  _____________________________________________________________________  External Attachment:    Type:   Image     Comment:   External Document

## 2010-02-27 NOTE — Miscellaneous (Signed)
Summary: Order/[facility]  Order/[facility]   Imported By: Lester Walthill 06/27/2009 07:19:05  _____________________________________________________________________  External Attachment:    Type:   Image     Comment:   External Document

## 2010-02-27 NOTE — Assessment & Plan Note (Signed)
Summary: BP ELEVATED-NOT BETTER----CONGESTION--STC   Vital Signs:  Patient profile:   40 year old female Height:      66 inches Weight:      205 pounds BMI:     33.21 Temp:     97.2 degrees F oral Pulse rate:   112 / minute Pulse rhythm:   regular Resp:     16 per minute BP sitting:   138 / 90  (left arm) Cuff size:   regular  Vitals Entered By: Lanier Prude, Beverly Gust) (October 18, 2009 11:41 AM) CC: dizziness, nausea, cough Is Patient Diabetic? No Comments pt is not taking Benadryl, Zolpidem or Ranitidine.     CC:  dizziness, nausea, and cough.  History of Present Illness: C/o dizziness and nausea BP was 166/109 HR 118 last night C/o URI - bad cough Father in law just died - she is upset, stressed F/u panic attacks, depression  Current Medications (verified): 1)  Xanax 0.5 Mg  Tabs (Alprazolam) .... Take 1 Tablet By Mouth Two Times A Day (Office Visit Needed For Refills) 2)  Proair Hfa 108 (90 Base) Mcg/act  Aers (Albuterol Sulfate) .... 2 Inh Q4h As Needed Shortness of Breath 3)  Vesicare 5 Mg Tabs (Solifenacin Succinate) .[name].. 1 By Mouth Qd 4)  Vitamin D3 1000 Unit  Tabs (Cholecalciferol) .... 2 Qd 5)  Triamcinolone Acetonide 0.1 % Oint (Triamcinolone Acetonide) .... Use 1-2 Times A Day 6)  Hyzaar 100-25 Mg Tabs (Losartan Potassium-Hctz) .[name].. 1 By Mouth Qd 7)  Albuterol Sulfate (2.5 Mg/43ml) 0.083% Nebu (Albuterol Sulfate) .... Use Qid Via Hhn Prn 8)  Benadryl 25 Mg Tabs (Diphenhydramine Hcl) .... As Directed 9)  Zolpidem Tartrate 12.5 Mg Cr-Tabs (Zolpidem Tartrate) .[name].. 1po At Bedtime As Needed 10)  Sumatriptan Succinate 100 Mg Tabs (Sumatriptan Succinate) .[name].. 1 By Mouth Every Other Day As Needed 11)  Flexeril 10 Mg Tabs (Cyclobenzaprine Hcl) .[name].. 1 By Mouth Three Times A Day Prn 12)  Cymbalta 60 Mg Cpep (Duloxetine Hcl) .[name].. 1 By Mouth Once Daily For Depression 13)  Seroquel Xr 150 Mg Xr24h-Tab (Quetiapine Fumarate) .... 2 Once Daily 14)  Flector 1.3 % Ptch  (Diclofenac Epolamine) 15)  Ranitidine Hcl 75 Mg/40ml Syrp (Ranitidine Hcl) .... 2 Once Daily 16)  Vimovo 500-20 Mg Tbec (Naproxen-Esomeprazole) .[name].. 1 By Mouth Once Daily - Two Times A Day Pc As Needed Pain 17)  Toprol Xl 50 Mg Xr24h-Tab (Metoprolol Succinate) .[name].. 1 By Mouth Once Daily 18)  Vyvanse 40 Mg Caps (Lisdexamfetamine Dimesylate) .[name].. 1 By Mouth Qd 19)  Nebulizer .... As Dirr. Dx Asthma 493.90 20)  Lunesta 3 Mg Tabs (Eszopiclone) .[name].. 1 At Bedtime  Allergies (verified): 1)  ! Amoxicillin (Amoxicillin) 2)  ! Lisinopril (Lisinopril) 3)  ! Hydrochlorothiazide (Hydrochlorothiazide) 4)  Doxycycline  Past History:  Past Medical History: Last updated: 10/11/2009 Anxiety Asthma Depression Hypertension INSOMNIA Gyn Dr Barnabas Lister FAMILY  H/0 DM2   V18.0 Urinary incontinence Vit D def ADD Vit D def H/o boils MRSA abscess 2010 GERD 2011 Tachycardia  Past Surgical History: Last updated: 05/02/2009 Hysterectomy partial  07 Tonsillectomy  04-29-00 ABD [place]  10-12-97 I&D R axilla MRSA abscess Dr Purnell Shoemaker 2011  Social History: Last updated: 08/15/2009 Occupation:HR;waiting tables 2 d/ wk; back to school Married 2 kids Current Smoker 1 ppd 2010  Family History: Reviewed history from 08/10/2009 and no changes required. Family History of Asthma Family History of CAD Female 1st degree relative <50 Family History Diabetes 1st degree relative M MS and  breast CA sister with Lupus  Social History: Reviewed history from 08/15/2009 and no changes required. Occupation:HR;waiting tables 2 d/ wk; back to school Married 2 kids Current Smoker 1 ppd 2010  Review of Systems       The patient complains of dyspnea on exertion and prolonged cough.  The patient denies weight loss and weight gain.    Physical Exam  General:  alert and overweight-appearing.   Nose:  no external deformity and no nasal discharge.   Mouth:  no gingival abnormalities and pharynx erythematous  and moist.    Neck:  supple and no masses.   Lungs:  normal respiratory effort and normal breath sounds.   Heart:  Tachy and regular HR 110-120 at rest.  Dizzy when stood up  Abdomen:  soft, non-tender, and normal bowel sounds.   Msk:  somewhat puffy bilat MCP's;  no other signifcant joint tender or swelling Neurologic:  cranial nerves II-XII intact and strength normal in all extremities.  Leg shaking when sitting. H-P (-) B Skin:  color normal and no rashes.   Psych:    Oriented X3, not suicidal, not homicidal, depressed affect and moderately anxious.     Impression & Recommendations:  Problem # 1:  TACHYCARDIA (ICD-785.0) Assessment Unchanged Increase Toprol to bid  Problem # 2:  VERTIGO (ICD-780.4) Assessment: Unchanged  Her updated medication list for this problem includes:    Benadryl 25 Mg Tabs (Diphenhydramine hcl) .[name]... As directed  Problem # 3:  STRESS DISORDER (ICD-V62.89) Assessment: Unchanged Discussed  Problem # 4:  DEPRESSION (ICD-311) Assessment: Unchanged  Her updated medication list for this problem includes:    Xanax 0.5 Mg Tabs (Alprazolam) .[name]... Take 1 tablet by mouth two times a day (office visit needed for refills)    Cymbalta 60 Mg Cpep (Duloxetine hcl) .[name]... 1 by mouth once daily for depression  Problem # 5:  ASTHMA (ICD-493.90) Assessment: Deteriorated  Her updated medication list for this problem includes:    Proair Hfa 108 (90 Base) Mcg/act Aers (Albuterol sulfate) .[name]... 2 inh q4h as needed shortness of breath    Albuterol Sulfate (2.5 Mg/19ml) 0.083% Nebu (Albuterol sulfate) ..... Use qid via hhn prn  Problem # 6:  SMOKER (ICD-305.1) Assessment: Unchanged  Problem # 7:  UPPER RESPIRATORY INFECTION, ACUTE (ICD-465.9) Assessment: New     Tussionex Pennkinetic Er 8-10 Mg/67ml Lqcr (Chlorpheniramine-hydrocodone) .Marland KitchenMarland KitchenMarland KitchenMarland Kitchen 5 ml by mouth two times a day as needed cough  Complete Medication List: 1)  Xanax 0.5 Mg Tabs (Alprazolam) .... Take 1 tablet by mouth two  times a day (office visit needed for refills) 2)  Proair Hfa 108 (90 Base) Mcg/act Aers (Albuterol sulfate) .... 2 inh q4h as needed shortness of breath 3)  Vesicare 5 Mg Tabs (Solifenacin succinate) .[name].. 1 by mouth qd 4)  Vitamin D3 1000 Unit Tabs (Cholecalciferol) .... 2 qd 5)  Triamcinolone Acetonide 0.1 % Oint (Triamcinolone acetonide) .... Use 1-2 times a day 6)  Hyzaar 100-25 Mg Tabs (Losartan potassium-hctz) .[name].. 1 by mouth qd 7)  Albuterol Sulfate (2.5 Mg/61ml) 0.083% Nebu (Albuterol sulfate) .... Use qid via hhn prn 8)  Benadryl 25 Mg Tabs (Diphenhydramine hcl) .... As directed 9)  Zolpidem Tartrate 12.5 Mg Cr-tabs (Zolpidem tartrate) .[name].. 1po at bedtime as needed 10)  Sumatriptan Succinate 100 Mg Tabs (Sumatriptan succinate) .[name].. 1 by mouth every other day as needed 11)  Flexeril 10 Mg Tabs (Cyclobenzaprine hcl) .[name].. 1 by mouth three times a day prn 12)  Cymbalta 60  Mg Cpep (Duloxetine hcl) .[name].. 1 by mouth once daily for depression 13)  Seroquel Xr 150 Mg Xr24h-tab (Quetiapine fumarate) .[name].. 1 once daily 14)  Flector 1.3 % Ptch (Diclofenac epolamine) 15)  Ranitidine Hcl 75 Mg/94ml Syrp (Ranitidine hcl) .... 2 once daily 16)  Vimovo 500-20 Mg Tbec (Naproxen-esomeprazole) .[name].. 1 by mouth once daily - two times a day pc as needed pain 17)  Toprol Xl 50 Mg Xr24h-tab (Metoprolol succinate) .[name].. 1 by mouth bid 18)  Vyvanse 40 Mg Caps (Lisdexamfetamine dimesylate) .[name].. 1 by mouth qd 19)  Nebulizer  .... As dirr. dx asthma 493.90 20)  Lunesta 3 Mg Tabs (Eszopiclone) .[name].. 1 at bedtime 21)  Abilify 2 Mg Tabs (Aripiprazole) .[name].. 1 by mouth by mouth qd 22)  Tussionex Pennkinetic Er 8-10 Mg/25ml Lqcr (Chlorpheniramine-hydrocodone) .... 5 ml by mouth two times a day as needed cough  Patient Instructions: 1)  Please schedule a follow-up appointment in 1 month. 2)  Call if you are not better in a reasonable amount of time or if worse.  Prescriptions: TUSSIONEX PENNKINETIC ER 8-10 MG/5ML LQCR  (CHLORPHENIRAMINE-HYDROCODONE) 5 ml by mouth two times a day as needed cough  #200 ml x 0   Entered and Authorized by:   Tresa Garter MD   Signed by:   Tresa Garter MD on 10/18/2009   Method used:   Print then Give to Patient   RxID:   [id number] TOPROL XL 50 MG XR24H-TAB (METOPROLOL SUCCINATE) 1 by mouth bid  #60 x 0   Entered and Authorized by:   Tresa Garter MD   Signed by:   Tresa Garter MD on 10/18/2009   Method used:   Print then Give to Patient   RxID:   [id number]

## 2010-02-27 NOTE — Progress Notes (Signed)
Summary: Referral  Phone Note Call from Patient Call back at Home Phone [phone]   Summary of Call: Patient is requesting a call about a referral.  Initial call taken by: Lamar Sprinkles, CMA,  August 28, 2009 4:55 PM  Follow-up for Phone Call        Returned call to pt who stated that  she has been seeing a psychologist who thinks that she should see a psychiatrist. Patient has appt 08/31/09 but would like to know if MD can go ahead and start referral. Please advise Thanks.Marland KitchenMarland KitchenMarland KitchenAlvy Beal Archie CMA  August 29, 2009 8:32 AM   Additional Follow-up for Phone Call Additional follow up Details #1::        OK - will do Additional Follow-up by: Tresa Garter MD,  August 29, 2009 1:01 PM    Additional Follow-up for Phone Call Additional follow up Details #2::    Pcc to [facility].Marland KitchenMarland KitchenAlvy Beal Archie CMA  August 29, 2009 1:13 PM

## 2010-02-27 NOTE — Progress Notes (Signed)
Summary: rx request  Phone Note Call from Patient Call back at Work Phone [phone]   Summary of Call: Patient left message on triage that she has continued cough and allergy issues. Patient states that she was seen recently and would like to skip office visit if possible. Patient notes that Promethazine cough syrup has not been helping and is requesting Tussionex be sent to [facility]. Please advise. Initial call taken by: Lucious Groves,  May 31, 2009 8:42 AM  Follow-up for Phone Call        ok Tussionex Follow-up by: Tresa Garter MD,  May 31, 2009 12:41 PM  Additional Follow-up for Phone Call Additional follow up Details #1::        Rx Called Into [facility] [phone]. Notified pt rx was call into pharmacy. Additional Follow-up by: Orlan Leavens,  May 31, 2009 2:23 PM    New/Updated Medications: Sandria Senter ER 8-10 MG/5ML LQCR (CHLORPHENIRAMINE-HYDROCODONE) 5 ml by mouth two times a day as needed cough Prescriptions: TUSSIONEX PENNKINETIC ER 8-10 MG/5ML LQCR (CHLORPHENIRAMINE-HYDROCODONE) 5 ml by mouth two times a day as needed cough  #100 ml x 0   Entered and Authorized by:   Tresa Garter MD   Signed by:   Orlan Leavens on 05/31/2009   Method used:   Print then Give to Patient   RxID:   [id number]

## 2010-02-27 NOTE — Progress Notes (Signed)
Summary: lab  Phone Note Call from Patient Call back at Work Phone [phone]   Caller: Patient Summary of Call: Patient called stating that she is scheduled f or an appt next week 2/9/111 and need to have labs done. She works at the [employer] and would like lab order faxed there. Is this ok and if so, please advise on lab orders Initial call taken by: Rock Nephew CMA,  February 28, 2009 2:39 PM  Follow-up for Phone Call        She needs BMP and Vit D 268.9 995.20 Follow-up by: Tresa Garter MD,  February 28, 2009 5:07 PM  Additional Follow-up for Phone Call Additional follow up Details #1::        Pt had labs drawn this am. see EMR Additional Follow-up by: Orlan Leavens,  March 01, 2009 10:01 AM

## 2010-02-27 NOTE — Progress Notes (Signed)
Summary: FLMA  Phone Note Call from Patient   Summary of Call: Patient is requesting that FMLA paperwork date to be changed to 7/14. That was the date she first was seen by the office regarding the problem.  Initial call taken by: Lamar Sprinkles, CMA,  August 24, 2009 11:06 AM  Follow-up for Phone Call        ok Follow-up by: Tresa Garter MD,  August 24, 2009 12:12 PM  Additional Follow-up for Phone Call Additional follow up Details #1::        Patient notified, and we will correct and re-fax per request. Lucious Groves CMA  August 24, 2009 1:41 PM

## 2010-02-27 NOTE — Assessment & Plan Note (Signed)
Summary: 1 MO ROV /NWS  #   Vital Signs:  Patient profile:   40 year old female Height:      66 inches Weight:      207 pounds BMI:     33.53 Temp:     98.0 degrees F oral Pulse rate:   96 / minute Pulse rhythm:   regular Resp:     16 per minute BP sitting:   130 / 98  (left arm) Cuff size:   large  Vitals Entered By: Lanier Prude, CMA(AAMA) (September 28, 2009 9:05 AM) CC: 1 mo f/u Is Patient Diabetic? No Comments Pt needs Rf on Xanax, Vyvanse and Flexeril and ((Flector Patch to be sent to [facility].))   CC:  1 mo f/u.  History of Present Illness: The patient presents for a follow up of back pain, anxiety, depression and headaches. C/o bad GERD x 2 wks  Current Medications (verified): 1)  Xanax 0.5 Mg  Tabs (Alprazolam) .... Take 1 Tablet By Mouth Two Times A Day (Office Visit Needed For Refills) 2)  Proair Hfa 108 (90 Base) Mcg/act  Aers (Albuterol Sulfate) .... 2 Inh Q4h As Needed Shortness of Breath 3)  Vesicare 5 Mg Tabs (Solifenacin Succinate) .[name].. 1 By Mouth Qd 4)  Vitamin D3 1000 Unit  Tabs (Cholecalciferol) .... 2 Qd 5)  Mobic 15 Mg Tabs (Meloxicam) .[name].. 1 Daily 6)  Vyvanse 50 Mg Caps (Lisdexamfetamine Dimesylate) .[name].. 1 By Mouth Qam. Please,  Fill On or After  02/05/09       . Ok To Fill 1 Day Earlier When The Month Has 31 Days in It. 7)  Triamcinolone Acetonide 0.1 % Oint (Triamcinolone Acetonide) .... Use 1-2 Times A Day 8)  Hyzaar 100-25 Mg Tabs (Losartan Potassium-Hctz) .[name].. 1 By Mouth Qd 9)  Nebulizer .... As Dirr. Dx Asthma 493.90 10)  Albuterol Sulfate (2.5 Mg/49ml) 0.083% Nebu (Albuterol Sulfate) .... Use Qid Via Hhn Prn 11)  Benadryl 25 Mg Tabs (Diphenhydramine Hcl) .... As Directed 12)  Zolpidem Tartrate 12.5 Mg Cr-Tabs (Zolpidem Tartrate) .[name].. 1po At Bedtime As Needed 13)  Sumatriptan Succinate 100 Mg Tabs (Sumatriptan Succinate) .[name].. 1 By Mouth Every Other Day As Needed 14)  Flexeril 10 Mg Tabs (Cyclobenzaprine Hcl) .... As Directed 15)  Cymbalta 60 Mg Cpep  (Duloxetine Hcl) .[name].. 1 By Mouth Once Daily For Depression 16)  Promethazine-Codeine 6.25-10 Mg/28ml Syrp (Promethazine-Codeine) .... 5-10 Ml By Mouth Q Id As Needed Cough 17)  Seroquel Xr 150 Mg Xr24h-Tab (Quetiapine Fumarate) .... 2 Once Daily 18)  Flector 1.3 % Ptch (Diclofenac Epolamine) 19)  Ranitidine Hcl 75 Mg/54ml Syrp (Ranitidine Hcl) .... 2 Once Daily  Allergies (verified): 1)  ! Amoxicillin (Amoxicillin) 2)  ! Lisinopril (Lisinopril) 3)  ! 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    Physical Exam  General:  alert and overweight-appearing.   Head:  normocephalic and atraumatic.   Nose:  no external deformity and no nasal discharge.   Mouth:  no gingival abnormalities and pharynx pink and moist.   Neck:  supple and no masses.   Lungs:  normal respiratory effort and normal breath sounds.   Heart:  normal rate and regular rhythm.   Abdomen:  soft, non-tender, and normal bowel sounds.   Msk:  somewhat puffy bilat MCP's;  no other signifcant joint tender or swelling Extremities:  no edema, no erythema  Neurologic:  cranial nerves II-XII intact and strength normal in all extremities.   Skin:  color normal and no rashes.   Psych:    Oriented X3, not suicidal, not homicidal, depressed affect, tearful, and moderately anxious.     Impression & Recommendations:  Problem # 1:  DEPRESSION (ICD-311) Assessment Unchanged To work 9/23 per Psych Her updated medication list for this problem includes:    Xanax 0.5  Mg Tabs (Alprazolam) .[name]... Take 1 tablet by mouth two times a day (office visit needed for refills)    Cymbalta 60 Mg Cpep (Duloxetine hcl) .[name]... 1 by mouth once daily for depression  Problem # 2:  STRESS DISORDER (ICD-V62.89) Assessment: Unchanged  Problem # 3:  ARTHRALGIA (ICD-719.40) Assessment: Unchanged  Problem # 4:  GERD (ICD-530.81) Assessment: New Try Vimovo Her updated medication list for this problem includes:    Ranitidine Hcl 75 Mg/15ml Syrp (Ranitidine hcl) .[name]... 2 once daily  Complete Medication List: 1)  Xanax 0.5 Mg Tabs (Alprazolam) .... Take 1 tablet by mouth two times a day (office visit needed for refills) 2)  Proair Hfa 108 (90 Base) Mcg/act Aers (Albuterol sulfate) .... 2 inh q4h as needed shortness of breath 3)  Vesicare 5 Mg Tabs (Solifenacin succinate) .[name].. 1 by mouth qd 4)  Vitamin D3 1000 Unit Tabs (Cholecalciferol) .... 2 qd 5)  Vyvanse 50 Mg Caps (Lisdexamfetamine dimesylate) .[name].. 1 by mouth qam. please,  fill on or after  02/05/09       . ok to fill 1 day earlier when the month has 31 days in it. 6)  Triamcinolone Acetonide 0.1 % Oint (Triamcinolone acetonide) .... Use 1-2 times a day 7)  Hyzaar 100-25 Mg Tabs (Losartan potassium-hctz) .[name].. 1 by mouth qd 8)  Nebulizer  .... As dirr. dx asthma 493.90 9)  Albuterol Sulfate (2.5 Mg/54ml) 0.083% Nebu (Albuterol sulfate) .... Use qid via hhn prn 10)  Benadryl 25 Mg Tabs (Diphenhydramine hcl) .... As directed 11)  Zolpidem Tartrate 12.5 Mg Cr-tabs (Zolpidem tartrate) .[name].. 1po at bedtime as needed 12)  Sumatriptan Succinate 100 Mg Tabs (Sumatriptan succinate) .[name].. 1 by mouth every other day as needed 13)  Flexeril 10 Mg Tabs (Cyclobenzaprine hcl) .[name].. 1 by mouth three times a day prn 14)  Cymbalta 60 Mg Cpep (Duloxetine hcl) .[name].. 1 by mouth once daily for depression 15)  Promethazine-codeine 6.25-10 Mg/34ml Syrp (Promethazine-codeine) .... 5-10 ml by mouth q id as needed cough 16)  Seroquel Xr 150 Mg Xr24h-tab  (Quetiapine fumarate) .... 2 once daily 17)  Flector 1.3 % Ptch (Diclofenac epolamine) 18)  Ranitidine Hcl 75 Mg/73ml Syrp (Ranitidine hcl) .... 2 once daily 19)  Vimovo 500-20 Mg Tbec (Naproxen-esomeprazole) .[name].. 1 by mouth once daily - two times a day pc as needed pain  Other Orders: Admin 1st Vaccine (62130) Flu Vaccine 24yrs + (86578)  Patient Instructions: 1)  Please schedule a follow-up appointment in 6 wks Prescriptions: VIMOVO 500-20 MG TBEC (NAPROXEN-ESOMEPRAZOLE) 1 by mouth once daily - two times a day pc as needed pain  #60 x 3   Entered and Authorized by:   Tresa Garter MD   Signed by:  Georgina Quint Plotnikov MD on 09/28/2009   Method used:   Print then Give to Patient   RxID:   [id number] VYVANSE 50 MG CAPS (LISDEXAMFETAMINE DIMESYLATE) 1 by mouth qam. Please,  fill on or after  02/05/09       . OK to fill 1 day earlier when the month has 31 days in it.  #30 x 0   Entered and Authorized by:   Tresa Garter MD   Signed by:   Tresa Garter MD on 09/28/2009   Method used:   Print then Give to Patient   RxID:   [id number] XANAX 0.5 MG  TABS (ALPRAZOLAM) Take 1 tablet by mouth two times a day (office visit needed for refills)  #60 x 2   Entered and Authorized by:   Tresa Garter MD   Signed by:   Tresa Garter MD on 09/28/2009   Method used:   Print then Give to Patient   RxID:   [id number] FLEXERIL 10 MG TABS (CYCLOBENZAPRINE HCL) 1 by mouth three times a day prn  #90 x 3   Entered and Authorized by:   Tresa Garter MD   Signed by:   Tresa Garter MD on 09/28/2009   Method used:   Electronically to        [facility]       [address]       [place], [place]  [address]       Ph: [phone]       Fax: [phone]   RxID:   [id number] FLECTOR 1.3 % PTCH (DICLOFENAC EPOLAMINE)   #180 x 3   Entered and Authorized by:   Tresa Garter MD   Signed by:    Tresa Garter MD on 09/28/2009   Method used:   Print then Give to Patient   RxID:   [id number]  .lbflu   Flu Vaccine Consent Questions     Do you have a history of severe allergic reactions to this vaccine? no    Any prior history of allergic reactions to egg and/or gelatin? no    Do you have a sensitivity to the preservative Thimersol? no    Do you have a past history of Guillan-Barre Syndrome? no    Do you currently have an acute febrile illness? no    Have you ever had a severe reaction to latex? no    Vaccine information given and explained to patient? yes    Are you currently pregnant? no    Lot Number:[id number]   Exp Date:07/28/2010   Site Given  Left Deltoid IM Lanier Prude, [facility])  September 28, 2009 9:53 AM

## 2010-02-27 NOTE — Letter (Signed)
Summary: Out of Work  [facility]  [address] [place], [place] [address]   Phone: [phone]  Fax: [phone]    August 31, 2009   Employee:  [name]    To Whom It May Concern:   For Medical reasons, please excuse the above named employee from work for the following dates:  Start:   08/11/09  End:   09/29/09  If you need additional information, please feel free to contact our office.         Sincerely,    Tresa Garter MD

## 2010-02-27 NOTE — Progress Notes (Signed)
Summary: UTI?  Phone Note Call from Patient Call back at Home Phone [phone]   Summary of Call: Patient is requesting rx for uti, does she need u/a first?  Initial call taken by: Lamar Sprinkles, CMA,  November 23, 2009 12:45 PM  Follow-up for Phone Call        ok Cipro Follow-up by: Tresa Garter MD,  November 23, 2009 1:30 PM    New/Updated Medications: CIPROFLOXACIN HCL 250 MG TABS (CIPROFLOXACIN HCL) 1 by mouth two times a day for cystitis Prescriptions: CIPROFLOXACIN HCL 250 MG TABS (CIPROFLOXACIN HCL) 1 by mouth two times a day for cystitis  #10 x 0   Entered and Authorized by:   Tresa Garter MD   Signed by:   Etta Grandchild MD on 11/23/2009   Method used:   Electronically to        [facility]       [address]       [place], [place]  [address]       Ph: [phone]       Fax: [phone]   RxID:   [id number]   Appended Document: UTI? left vm for pt to check w/her pharm

## 2010-02-27 NOTE — Progress Notes (Signed)
  Phone Note Call from Patient   Caller: Patient Summary of Call: Patient called c/o congestion w cough, She is requesting that MD call her in a cough medication to [facility] in [place]. Please advise Thank. Initial call taken by: Rock Nephew CMA,  September 18, 2009 1:58 PM  Follow-up for Phone Call        ok Prom-Cod OV if sick Follow-up by: Tresa Garter MD,  September 18, 2009 5:33 PM  Additional Follow-up for Phone Call Additional follow up Details #1::        Rx called in and pt notified/lmovm.Alvy Beal Archie CMA  September 19, 2009 9:30 AM     New/Updated Medications: PROMETHAZINE-CODEINE 6.25-10 MG/5ML SYRP (PROMETHAZINE-CODEINE) 5-10 ml by mouth q id as needed cough Prescriptions: PROMETHAZINE-CODEINE 6.25-10 MG/5ML SYRP (PROMETHAZINE-CODEINE) 5-10 ml by mouth q id as needed cough  #300 ml x 0   Entered and Authorized by:   Tresa Garter MD   Signed by:   Tresa Garter MD on 09/18/2009   Method used:   Print then Give to Patient   RxID:   [id number]  [facility] called [facility] to [facility] [phone]

## 2010-02-27 NOTE — Progress Notes (Signed)
Summary: ELEVATED BP   Phone Note Call from Patient   Summary of Call: Patient recently had increase in BP med. Pt c/o lightheadedness today and check bp. It was 131/121, should pt change med again? Or office visit?  Initial call taken by: Lamar Sprinkles, CMA,  October 23, 2009 10:39 AM  Follow-up for Phone Call        Take Toprol XL three times a day untill gone - then 100 mg bid Follow-up by: Tresa Garter MD,  October 23, 2009 1:13 PM  Additional Follow-up for Phone Call Additional follow up Details #1::        Pt informed  Additional Follow-up by: Lamar Sprinkles, CMA,  October 23, 2009 1:33 PM    New/Updated Medications: TOPROL XL 100 MG XR24H-TAB (METOPROLOL SUCCINATE) 1 by mouth bid Prescriptions: TOPROL XL 100 MG XR24H-TAB (METOPROLOL SUCCINATE) 1 by mouth bid  #180 x 1   Entered by:   Lamar Sprinkles, CMA   Authorized by:   Tresa Garter MD   Signed by:   Lamar Sprinkles, CMA on 10/23/2009   Method used:   Faxed to ...       [facility] (mail-[facility])             , [place]         Ph: [phone]       Fax: [phone]   RxID:   [id number]

## 2010-02-27 NOTE — Progress Notes (Signed)
Summary: REFILLs  Phone Note Refill Request   Refills Requested: Medication #1:  KLONOPIN 1 MG TABS 1 by mouth tid   Dosage confirmed as above?Dosage Confirmed   Supply Requested: 6 months  Medication #2:  VESICARE 10 MG TABS 1 by mouth qd.   Dosage confirmed as above?Dosage Confirmed   Supply Requested: 1 year 90 day supply to go [facility]. OK?   Patient will lose her insurance on 10/31 and does not see psych until november. Are these ok to fill for 90 day supply?   Initial call taken by: Lamar Sprinkles, CMA,  November 08, 2009 4:04 PM  Follow-up for Phone Call        ok Thank you!  Follow-up by: Tresa Garter MD,  November 08, 2009 11:22 PM  Additional Follow-up for Phone Call Additional follow up Details #1::        Called pt and left message her prescription has been faxed to [facility] on work#.. Additional Follow-up by: Jarome Lamas,  November 09, 2009 11:22 AM    Prescriptions: VESICARE 10 MG TABS (SOLIFENACIN SUCCINATE) 1 by mouth qd  #90 x 3   Entered by:   Lamar Sprinkles, CMA   Authorized by:   Tresa Garter MD   Signed by:   Lamar Sprinkles, CMA on 11/09/2009   Method used:   Printed then faxed to ...       [facility] (mail-[facility])             , [place]         Ph: [phone]       Fax: [phone]   RxID:   [id number] KLONOPIN 1 MG TABS (CLONAZEPAM) 1 by mouth tid  #270 x 1   Entered by:   Lamar Sprinkles, CMA   Authorized by:   Tresa Garter MD   Signed by:   Lamar Sprinkles, CMA on 11/09/2009   Method used:   Printed then faxed to ...       [facility] (mail-order)             , [place]         Ph: [phone]       Fax: [phone]   RxID:   [id number]

## 2010-02-27 NOTE — Progress Notes (Signed)
Summary: Samples  Phone Note Call from Patient Call back at Home Phone [phone]   Summary of Call: Patient is requesting samples of abilify and cymbalta if avail, says she forgot to ask again before leaving yesterday.  Initial call taken by: Lamar Sprinkles, CMA,  November 02, 2009 12:28 PM  Follow-up for Phone Call        left detailed mess informing pt some samples are upfront for her to p/u. Follow-up by: Lanier Prude, [facility]),  November 02, 2009 2:42 PM    Prescriptions: CYMBALTA 60 MG CPEP (DULOXETINE HCL) 1 by mouth once daily for depression  #35 x 0   Entered by:   Lanier Prude, [facility])   Authorized by:   Tresa Garter MD   Signed by:   Lanier Prude, CMA(AAMA) on 11/02/2009   Method used:   Samples Given   RxID:   [id number]

## 2010-02-27 NOTE — Progress Notes (Signed)
  Phone Note Other Incoming   Request: Send information Summary of Call: Request for records received from [facility]. Request forwarded to [facility].     

## 2010-02-27 NOTE — Progress Notes (Signed)
Summary: Continued symptoms  Phone Note Call from Patient Call back at [facility] Phone [phone]   Summary of Call: Pt completed antibiotic for 2 days. Now drainage is green again. She is taking benadryl, claritin & sudafed w/no relief.  Initial call taken by: Lamar Sprinkles, CMA,  Jun 23, 2009 11:44 AM  Follow-up for Phone Call        Refill Levaquin for another 10 d. Irrigate sinuses if tolerated Follow-up by: Tresa Garter MD,  Jun 23, 2009 4:53 PM  Additional Follow-up for Phone Call Additional follow up Details #1::        Pt informed  Additional Follow-up by: Lamar Sprinkles, CMA,  Jun 23, 2009 5:02 PM    New/Updated Medications: LEVAQUIN 500 MG TABS (LEVOFLOXACIN)

## 2010-02-27 NOTE — Progress Notes (Signed)
Summary: [facility]  Phone Note Call from Patient   Summary of Call: Patient sent in [facility] paperwork with generic alternatives that would save pt money.  1. Current rx: Losartan/hctz. Generic suggestion is lisinopril/hctz 2. Current rx: Proair HFA 90 micrograms. Pharm suggests albuterol sulfate nebulized solution. (this would mean pt would not have rescue inhaler unless she had portable nebulizer, correct?) 3. Current rx: Vyvanse Generic suggestion: amphetamine/dextroamphetamine salts combo.  Please advise, Do you need to see pt in the office to discuss possible changes?    Initial call taken by: Lamar Sprinkles, CMA,  April 03, 2009 3:03 PM  Follow-up for Phone Call        1 She is allergic to Lisinopril - No. Losartan is GENERIC 2 OK Albuterol - Does she have a nebulizer? - it may cost $$ 3 OK Adderall if needed 10 mg two times a day (am and lunch) # 60, 0 ref Follow-up by: Tresa Garter MD,  April 04, 2009 12:00 AM  Additional Follow-up for Phone Call Additional follow up Details #1::        left mess to call office back on wk #.....................Marland KitchenLamar Sprinkles, CMA  April 04, 2009 8:29 AM   left mess to call office back .....................Marland KitchenLamar Sprinkles, CMA  April 04, 2009 2:30 PM     Additional Follow-up for Phone Call Additional follow up Details #2::    Spoke with pt: 1. Ok to no changes in bp med, does not need new rx. 2. She does not want nebulizer will keep proair rx same. 3. She would like rx for Adderall to be 90 day supply (will get for 0$) and would like mailed to her home address. (pt must mail into [facility] herself) OK?   ............................Marland KitchenLamar Sprinkles, CMA  April 04, 2009 5:39 PM  OK. Thx Follow-up by: Tresa Garter MD,  April 04, 2009 10:19 PM  Additional Follow-up for Phone Call Additional follow up Details #3:: Details for Additional Follow-up Action Taken: Mailed Additional Follow-up by: Lamar Sprinkles, CMA,  April 05, 2009  1:51 PM  New/Updated Medications: ADDERALL 10 MG TABS (AMPHETAMINE-DEXTROAMPHETAMINE) 1 by mouth two times a day (am and lunch) Prescriptions: ADDERALL 10 MG TABS (AMPHETAMINE-DEXTROAMPHETAMINE) 1 by mouth two times a day (am and lunch)  #180 x 0   Entered and Authorized by:   Tresa Garter MD   Signed by:   Lamar Sprinkles, CMA on 04/05/2009   Method used:   Print then Give to Patient   RxID:   [id number] MOBIC 15 MG TABS (MELOXICAM) 1 daily  #90 x 1   Entered by:   Lamar Sprinkles, CMA   Authorized by:   Tresa Garter MD   Signed by:   Lamar Sprinkles, CMA on 04/04/2009   Method used:   Faxed to ...       [facility] (mail-order)             ,          Ph: [phone]       Fax: [phone]   RxID:   [id number] PAROXETINE HCL 10 MG TABS (PAROXETINE HCL) 1 by mouth once daily for depression  #90 x 3   Entered by:   Lamar Sprinkles, CMA   Authorized by:   Tresa Garter MD   Signed by:   Lamar Sprinkles, CMA on 04/04/2009   Method used:   Faxed to .Marland KitchenMarland Kitchen  [facility] (mail-order)             ,          Ph: [phone]       Fax: [phone]   RxID:   [id number]

## 2010-02-27 NOTE — Assessment & Plan Note (Signed)
Summary: ELEVATED BP/NWS   Vital Signs:  Patient profile:   40 year old female Height:      66 inches Weight:      209.50 pounds BMI:     33.94 O2 Sat:      97 % on Room air Temp:     98.4 degrees F oral Pulse rate:   109 / minute BP sitting:   112 / 78  (left arm) Cuff size:   large  Vitals Entered By: Zella Ball Ewing CMA Duncan Dull) (August 10, 2009 10:32 AM)  O2 Flow:  Room air CC: Elevated BP, anxiety problems, leg cramps/RE   CC:  Elevated BP, anxiety problems, and leg cramps/RE.  History of Present Illness: here to f/u - has 3 BP's borderline in the past 2 month (at drug stores and denitst);  can only tolerate half the hyzaar due to lack of energy and dizziness;  has ongoing anxiety and insomnia (worse recently, more overwhlemed recently - works full time, mother had cancer, going back to school next month, waitressing 2 days per wk,  sister depressed with lupus, also mother with MS,  2 teens at home , still married) and more recently incr leg cramps;  not always with the meloxicam (but very difficulty to cont to work after a wk without the nsaid); has seen optho with occas blurred vision but exam ok;  has recurring for months mild to mod right eye throbbing pain with nauea, photophobia, phonophobia, no vomiting;  typically last 2 to 6 hrs ,  extra xanax and going to sleep usually helps; no prior CT or MRI of the head.  Pt denies CP, sob, doe, wheezing, orthopnea, pnd, worsening LE edema, palps, dizziness or syncope  Pt denies new neuro symptoms such as headache, facial or extremity weakness  No fever, wt loss, night sweats, or other constitutional symptoms  Has ongoing hand pain, but ? worse recently to the MCP's, types all days, also pain to the knees, ankle, wrists, neck and shoulder without sweling, mild to mod worse in the past 2 wks.  Also with 2 wks nightly wakes up with calf cramping - only to the right leg. Right handed.  Does not yet have appt soon with PCP - Dr Posey Rea.  Requests  change back to vyvanse as worked better than the adderall, denies worsening depressive symtpoms or suicidal ideation  Problems Prior to Update: 1)  Arthralgia  (ICD-719.40) 2)  Leg Cramps  (ICD-729.82) 3)  Fatigue  (ICD-780.79) 4)  Skin Rash  (ICD-782.1) 5)  Vomiting  (ICD-787.03) 6)  Abscess, Axilla  (ICD-682.3) 7)  Upper Respiratory Infection, Acute  (ICD-465.9) 8)  Smoker  (ICD-305.1) 9)  Abscess, Suprapubic  (ICD-682.2) 10)  Dysuria  (ICD-788.1) 11)  Paresthesia  (ICD-782.0) 12)  Add  (ICD-314.00) 13)  Urinary Incontinence  (ICD-788.30) 14)  Female Stress Incontinence  (ICD-625.6) 15)  Weight Gain  (ICD-783.1) 16)  Hypertension  (ICD-401.9) 17)  Depression  (ICD-311) 18)  Asthma  (ICD-493.90) 19)  Vitamin D Deficiency  (ICD-268.9) 20)  Anxiety  (ICD-300.00) 21)  Insomnia, Persistent  (ICD-307.42) 22)  Family History Diabetes 1st Degree Relative  (ICD-V18.0) 23)  Family History of Cad Female 1st Degree Relative <50  (ICD-V17.3) 24)  Family History of Asthma  (ICD-V17.5)  Medications Prior to Update: 1)  Xanax 0.5 Mg  Tabs (Alprazolam) .... Take 1 Tablet By Mouth Two Times A Day (Office Visit Needed For Refills) 2)  Temazepam 15 Mg Caps (Temazepam) .[name].. 1po At Bedtime  As Needed 3)  Proair Hfa 108 (90 Base) Mcg/act  Aers (Albuterol Sulfate) .... 2 Inh Q4h As Needed Shortness of Breath 4)  Vesicare 5 Mg Tabs (Solifenacin Succinate) .[name].. 1 By Mouth Qd 5)  Vitamin D3 1000 Unit  Tabs (Cholecalciferol) .... 2 Qd 6)  Mobic 15 Mg Tabs (Meloxicam) .[name].. 1 Daily 7)  Vyvanse 50 Mg Caps (Lisdexamfetamine Dimesylate) .[name].. 1 By Mouth Qam. Please,  Fill On or After  02/05/09       . Ok To Fill 1 Day Earlier When The Month Has 31 Days in It. 8)  Triamcinolone Acetonide 0.1 % Oint (Triamcinolone Acetonide) .... Use 1-2 Times A Day 9)  Hyzaar 100-25 Mg Tabs (Losartan Potassium-Hctz) .[name].. 1 By Mouth Qd 10)  Paroxetine Hcl 10 Mg Tabs (Paroxetine Hcl) .[name].. 1 By Mouth Once Daily For Depression 11)   Adderall 10 Mg Tabs (Amphetamine-Dextroamphetamine) .[name].. 1 By Mouth Two Times A Day (Am and Lunch) 12)  Hydrocodone-Acetaminophen 5-325 Mg Tabs (Hydrocodone-Acetaminophen) .[name].. 1-2 By Mouth Two Times A Day As Needed Pain 13)  Bactrim 400-80 Mg Tabs (Sulfamethoxazole-Trimethoprim) .... 2 Qd 14)  Tussionex Pennkinetic Er 8-10 Mg/49ml Lqcr (Chlorpheniramine-Hydrocodone) .... 5 Ml By Mouth Two Times A Day As Needed Cough 15)  Nebulizer .... As Dirr. Dx Asthma 493.90 16)  Albuterol Sulfate (2.5 Mg/52ml) 0.083% Nebu (Albuterol Sulfate) .... Use Qid Via Hhn Prn 17)  Benadryl 25 Mg Tabs (Diphenhydramine Hcl) .... As Directed 18)  Mucinex Dm 30-600 Mg Xr12h-Tab (Dextromethorphan-Guaifenesin) .... As Directed 19)  Nasal Spray (Otc) .... As Directed 20)  Levaquin 500 Mg Tabs (Levofloxacin) .[name].. 1 By Mouth Qd 21)  Prednisone 10 Mg Tabs (Prednisone) .... Take 40mg  Qd For 3 Days, Then 20 Mg Qd For 3 Days, Then 10mg  Qd For 6 Days, Then Stop. Take Pc. 22)  Sudafed 12 Hour 120 Mg Xr12h-Tab (Pseudoephedrine Hcl) .[name].. 1 By Mouth Two Times A Day As Needed Allergies 23)  Tessalon Perles 100 Mg Caps (Benzonatate) .[name].. 1-2 By Mouth Two Times A Day As Needed Cogh 24)  Diflucan 150 Mg Tabs (Fluconazole) .[name].. 1 By Mouth Once Daily Once For Yeast Infection 25)  Levaquin 500 Mg Tabs (Levofloxacin)  Current Medications (verified): 1)  Xanax 0.5 Mg  Tabs (Alprazolam) .... Take 1 Tablet By Mouth Two Times A Day (Office Visit Needed For Refills) 2)  Proair Hfa 108 (90 Base) Mcg/act  Aers (Albuterol Sulfate) .... 2 Inh Q4h As Needed Shortness of Breath 3)  Vesicare 5 Mg Tabs (Solifenacin Succinate) .[name].. 1 By Mouth Qd 4)  Vitamin D3 1000 Unit  Tabs (Cholecalciferol) .... 2 Qd 5)  Mobic 15 Mg Tabs (Meloxicam) .[name].. 1 Daily 6)  Vyvanse 50 Mg Caps (Lisdexamfetamine Dimesylate) .[name].. 1 By Mouth Qam. Please,  Fill On or After  02/05/09       . Ok To Fill 1 Day Earlier When The Month Has 31 Days in It. 7)  Triamcinolone Acetonide 0.1 %  Oint (Triamcinolone Acetonide) .... Use 1-2 Times A Day 8)  Hyzaar 100-25 Mg Tabs (Losartan Potassium-Hctz) .[name].. 1 By Mouth Qd 9)  Nebulizer .... As Dirr. Dx Asthma 493.90 10)  Albuterol Sulfate (2.5 Mg/47ml) 0.083% Nebu (Albuterol Sulfate) .... Use Qid Via Hhn Prn 11)  Benadryl 25 Mg Tabs (Diphenhydramine Hcl) .... As Directed 12)  Lexapro 10 Mg Tabs (Escitalopram Oxalate) .[name].. 1po Once Daily 13)  Zolpidem Tartrate 12.5 Mg Cr-Tabs (Zolpidem Tartrate) .[name].. 1po At Bedtime As Needed 14)  Sumatriptan Succinate 100 Mg Tabs (Sumatriptan  Succinate) .[name].. 1 By Mouth Every Other Day As Needed  Allergies (verified): 1)  ! Amoxicillin (Amoxicillin) 2)  ! Lisinopril (Lisinopril) 3)  ! Hydrochlorothiazide (Hydrochlorothiazide) 4)  Doxycycline  Past History:  Past Medical History: Last updated: 05/02/2009 Anxiety Asthma Depression Hypertension INSOMNIA Gyn Dr Barnabas Lister FAMILY  H/0 DM2   V18.0 Urinary incontinence Vit D def ADD Vit D def H/o boils MRSA abscess 2010  Past Surgical History: Last updated: 05/02/2009 Hysterectomy partial  07 Tonsillectomy  04-29-00 ABD [place]  10-12-97 I&D R axilla MRSA abscess Dr Purnell Shoemaker 2011  Social History: Last updated: 07/27/2008 Occupation: Married 2 kids Current Smoker 1 ppd 2010  Risk Factors: Smoking Status: current (04/20/2009) Packs/Day: 0.5 (11/14/2008)  Family History: Reviewed history from 07/27/2008 and no changes required. Family History of Asthma Family History of CAD Female 1st degree relative <50 Family History Diabetes 1st degree relative M MS and breast CA sister with Lupus  Review of Systems       all otherwise negative per pt -    Physical Exam  General:  alert and overweight-appearing.   Head:  normocephalic and atraumatic.   Eyes:  vision grossly intact, pupils equal, and pupils round.   Ears:  R ear normal and L ear normal.   Nose:  no external deformity and no nasal discharge.   Mouth:  no gingival abnormalities  and pharynx pink and moist.   Neck:  supple and no masses.   Lungs:  normal respiratory effort and normal breath sounds.   Heart:  normal rate and regular rhythm.   Abdomen:  soft, non-tender, and normal bowel sounds.   Msk:  somewhat puffy bilat MCP's;  no other signifcant joint tender or swelling Extremities:  no edema, no erythema  Neurologic:  cranial nerves II-XII intact and strength normal in all extremities.   Skin:  color normal and no rashes.   Psych:  not depressed appearing and severely anxious.     Impression & Recommendations:  Problem # 1:  ANXIETY (ICD-300.00)  The following medications were removed from the medication list:    Paroxetine Hcl 10 Mg Tabs (Paroxetine hcl) .[name]... 1 by mouth once daily for depression Her updated medication list for this problem includes:    Xanax 0.5 Mg Tabs (Alprazolam) .[name]... Take 1 tablet by mouth two times a day (office visit needed for refills)    Lexapro 10 Mg Tabs (Escitalopram oxalate) .[name]... 1po once daily change to lexapro 10 once daily   Problem # 2:  ADD (ICD-314.00) to change back to the vyvanse per pt request - o/w stable   Problem # 3:  LEG CRAMPS (ICD-729.82) exam ok - occurrs right calf, nocturnal only - for labs prior to next visit;  for B complex vitamin for now  Problem # 4:  ARTHRALGIA (ICD-719.40) for labs prior to next vist,  cont nsaid as needed   Problem # 5:  INSOMNIA-SLEEP DISORDER-UNSPEC (ICD-780.52)  The following medications were removed from the medication list:    Temazepam 15 Mg Caps (Temazepam) .[name]... 1po at bedtime as needed Her updated medication list for this problem includes:    Zolpidem Tartrate 12.5 Mg Cr-tabs (Zolpidem tartrate) .[name]... 1po at bedtime as needed treat as above, f/u any worsening signs or symptoms   Problem # 6:  COMMON MIGRAINE (ICD-346.10)  The following medications were removed from the medication list:    Hydrocodone-acetaminophen 5-325 Mg Tabs (Hydrocodone-acetaminophen)  .[name]... 1-2 by mouth two times a day as needed pain Her updated  medication list for this problem includes:    Mobic 15 Mg Tabs (Meloxicam) .[name]... 1 daily    Sumatriptan Succinate 100 Mg Tabs (Sumatriptan succinate) .[name]... 1 by mouth every other day as needed treat as above, f/u any worsening signs or symptoms ; consider CNS imaging but hx quite typical and exam benign today  Problem # 7:  HYPERTENSION (ICD-401.9)  Her updated medication list for this problem includes:    Hyzaar 100-25 Mg Tabs (Losartan potassium-hctz) .[name]... 1 by mouth qd stable overall by hx and exam, ok to continue meds/tx as is  - no need incr med today  Problem # 8:  FEMALE STRESS INCONTINENCE (ICD-625.6) better with the vesicare - 2 wks sample given today  Complete Medication List: 1)  Xanax 0.5 Mg Tabs (Alprazolam) .... Take 1 tablet by mouth two times a day (office visit needed for refills) 2)  Proair Hfa 108 (90 Base) Mcg/act Aers (Albuterol sulfate) .... 2 inh q4h as needed shortness of breath 3)  Vesicare 5 Mg Tabs (Solifenacin succinate) .[name].. 1 by mouth qd 4)  Vitamin D3 1000 Unit Tabs (Cholecalciferol) .... 2 qd 5)  Mobic 15 Mg Tabs (Meloxicam) .[name].. 1 daily 6)  Vyvanse 50 Mg Caps (Lisdexamfetamine dimesylate) .[name].. 1 by mouth qam. please,  fill on or after  02/05/09       . ok to fill 1 day earlier when the month has 31 days in it. 7)  Triamcinolone Acetonide 0.1 % Oint (Triamcinolone acetonide) .... Use 1-2 times a day 8)  Hyzaar 100-25 Mg Tabs (Losartan potassium-hctz) .[name].. 1 by mouth qd 9)  Nebulizer  .... As dirr. dx asthma 493.90 10)  Albuterol Sulfate (2.5 Mg/62ml) 0.083% Nebu (Albuterol sulfate) .... Use qid via hhn prn 11)  Benadryl 25 Mg Tabs (Diphenhydramine hcl) .... As directed 12)  Lexapro 10 Mg Tabs (Escitalopram oxalate) .[name].. 1po once daily 13)  Zolpidem Tartrate 12.5 Mg Cr-tabs (Zolpidem tartrate) .[name].. 1po at bedtime as needed 14)  Sumatriptan Succinate 100 Mg Tabs (Sumatriptan succinate) .[name].. 1 by  mouth every other day as needed  Patient Instructions: 1)  stop the temazepam and paxil and adderall 2)  Please take all new medications as prescribed  - the lexapro, vyvanse, the generic for ambien cr, and generic for imitrex (sumatriptan) 3)  please start a B complex multivitiamin - 1 per day - OTC , to help with the leg crmaps 4)  Continue all previous medications as before this visit  5)  Please schedule an appointment with your primary doctor  - Dr Posey Rea in 2 to 4 wks with CPX labs and: 6)  Magnesium : 729.82 7)  sed rate;  719.40 8)  Rheum factor 719.40 9)  ANA 719.40 10)  B12/folate;  729.82 Prescriptions: SUMATRIPTAN SUCCINATE 100 MG TABS (SUMATRIPTAN SUCCINATE) 1 by mouth every other day as needed  #27 x 1   Entered and Authorized by:   Corwin Levins MD   Signed by:   Corwin Levins MD on 08/10/2009   Method used:   Print then Give to Patient   RxID:   [id number] ZOLPIDEM TARTRATE 12.5 MG CR-TABS (ZOLPIDEM TARTRATE) 1po at bedtime as needed  #90 x 1   Entered and Authorized by:   Corwin Levins MD   Signed by:   Corwin Levins MD on 08/10/2009   Method used:   Print then Give to Patient   RxID:   [id number] LEXAPRO 10 MG TABS (ESCITALOPRAM OXALATE) 1po once  daily  #90 x 3   Entered and Authorized by:   Corwin Levins MD   Signed by:   Corwin Levins MD on 08/10/2009   Method used:   Print then Give to Patient   RxID:   [id number] SUMATRIPTAN SUCCINATE 100 MG TABS (SUMATRIPTAN SUCCINATE) 1 by mouth every other day as needed  #9 x 11   Entered and Authorized by:   Corwin Levins MD   Signed by:   Corwin Levins MD on 08/10/2009   Method used:   Print then Give to Patient   RxID:   [id number] ZOLPIDEM TARTRATE 12.5 MG CR-TABS (ZOLPIDEM TARTRATE) 1po at bedtime as needed  #30 x 0   Entered and Authorized by:   Corwin Levins MD   Signed by:   Corwin Levins MD on 08/10/2009   Method used:   Print then Give to Patient   RxID:   [id number] LEXAPRO 10 MG TABS  (ESCITALOPRAM OXALATE) 1po once daily  #30 x 11   Entered and Authorized by:   Corwin Levins MD   Signed by:   Corwin Levins MD on 08/10/2009   Method used:   Print then Give to Patient   RxID:   [id number] VYVANSE 50 MG CAPS (LISDEXAMFETAMINE DIMESYLATE) 1 by mouth qam. Please,  fill on or after  02/05/09       . OK to fill 1 day earlier when the month has 31 days in it.  #30 x 0   Entered and Authorized by:   Corwin Levins MD   Signed by:   Corwin Levins MD on 08/10/2009   Method used:   Print then Give to Patient   RxID:   [id number]

## 2010-02-27 NOTE — Letter (Signed)
Summary: Out of Work  [facility]  [address] [place], [place] [address]   Phone: [phone]  Fax: [phone]    March 08, 2009   Employee:  STORMEY WILBORN    To Whom It May Concern:   For Medical reasons, please excuse the above named employee from work for the following dates:  Start:   03-07-2009  End:   03-07-2009  If you need additional information, please feel free to contact our office.         Sincerely,    Jacinta Shoe, M.D.

## 2010-02-27 NOTE — Progress Notes (Signed)
Summary: tussionex refill/plot pt  Phone Note Refill Request Message from:  Fax from Pharmacy on November 16, 2009 3:15 PM  Refills Requested: Medication #1:  Sandria Senter ER 8-10 MG/5ML LQCR 5 ml by mouth two times a day as needed cough   Last Refilled: 10/18/2009  Is this ok to refill for pt? [facility]  Initial call taken by: Rock Nephew CMA,  November 16, 2009 3:16 PM  Follow-up for Phone Call        rx faxed to [facility] Follow-up by: Brenton Grills MA,  November 16, 2009 5:03 PM    Prescriptions: Sandria Senter ER 8-10 MG/5ML LQCR (CHLORPHENIRAMINE-HYDROCODONE) 5 ml by mouth two times a day as needed cough  #200cc x 0   Entered and Authorized by:   Corwin Levins MD   Signed by:   Corwin Levins MD on 11/16/2009   Method used:   Print then Give to Patient   RxID:   [id number]  ok this time only;  will need ROV with dr Posey Rea if symptoms persist or worsen Corwin Levins MD  November 16, 2009 3:56 PM

## 2010-02-27 NOTE — Assessment & Plan Note (Signed)
Summary: NOT FEELING WELL//VGJ   Vital Signs:  Patient profile:   40 year old female Weight:      204 pounds Temp:     98.3 degrees F oral Pulse rate:   64 / minute Pulse rhythm:   regular BP sitting:   120 / 78  (right arm) Cuff size:   large  Vitals Entered By: Lowella Petties CMA (April 22, 2009 9:26 AM) CC: Check abscess under right arm.   History of Present Illness: 40 yo here for worsening boil and "feeling sick." Seen two days ago in office for abscess under right axilla. Abscess was I and D'd, given Doxycyline. Yesterday, redness and warmth spread, started vomiting and became subjectively febrile. Tried to take her Doxycyline this morning and threw it up.  Current Medications (verified): 1)  Xanax 0.5 Mg  Tabs (Alprazolam) .... Take 1 Tablet By Mouth Two Times A Day (Office Visit Needed For Refills) 2)  Temazepam 15 Mg Caps (Temazepam) .[name].. 1po At Bedtime As Needed 3)  Proair Hfa 108 (90 Base) Mcg/act  Aers (Albuterol Sulfate) .... 2 Inh Q4h As Needed Shortness of Breath 4)  Vesicare 5 Mg Tabs (Solifenacin Succinate) .[name].. 1 By Mouth Qd 5)  Vitamin D3 1000 Unit  Tabs (Cholecalciferol) .... 2 Qd 6)  Mobic 15 Mg Tabs (Meloxicam) .[name].. 1 Daily 7)  Vyvanse 50 Mg Caps (Lisdexamfetamine Dimesylate) .[name].. 1 By Mouth Qam. Please,  Fill On or After  02/05/09       . Ok To Fill 1 Day Earlier When The Month Has 31 Days in It. 8)  Triamcinolone Acetonide 0.1 % Oint (Triamcinolone Acetonide) .... Use 1-2 Times A Day 9)  Hyzaar 100-25 Mg Tabs (Losartan Potassium-Hctz) .[name].. 1 By Mouth Qd 10)  Paroxetine Hcl 10 Mg Tabs (Paroxetine Hcl) .[name].. 1 By Mouth Once Daily For Depression 11)  Promethazine-Codeine 6.25-10 Mg/53ml Syrp (Promethazine-Codeine) .... 5-10 Ml By Mouth Q Id As Needed Cough 12)  Adderall 10 Mg Tabs (Amphetamine-Dextroamphetamine) .[name].. 1 By Mouth Two Times A Day (Am and Lunch) 13)  Doxycycline Hyclate 100 Mg Caps (Doxycycline Hyclate) .[name].. 1 By Mouth Two Times A Day With A Glass of  Water 14)  Mupirocin 2 % Oint (Mupirocin) .... Use Two Times A Day W/dressing Change 15)  Hydrocodone-Acetaminophen 5-325 Mg Tabs (Hydrocodone-Acetaminophen) .[name].. 1-2 By Mouth Two Times A Day As Needed Pain  Allergies (verified): 1)  ! Amoxicillin (Amoxicillin) 2)  ! Lisinopril (Lisinopril) 3)  ! Hydrochlorothiazide (Hydrochlorothiazide)  Review of Systems      See HPI General:  Complains of chills and fever. GI:  Complains of nausea and vomiting.  Physical Exam  General:  NAD, overweight-appearing.   Mouth:  dry mucous membranes Skin:  right axilla- large area of erythema, swelling around packing, very tender to palpation Psych:  Oriented X3, normally interactive, good eye contact, not depressed appearing, and not suicidal.     Impression & Recommendations:  Problem # 1:  ABSCESS, AXILLA (ICD-682.3) Assessment Deteriorated with worsening cellulitis.  Vital signs stable, non toxic but has failed by mouth antibiotics. Needs hospital admission for IV antibiotics, and blood work--CBC, blood cultures. Discussed with hospitalist on call.  Pt will be admitted to [facility]. Her updated medication list for this problem includes:    Doxycycline Hyclate 100 Mg Caps (Doxycycline hyclate) .[name]... 1 by mouth two times a day with a glass of water  Complete Medication List: 1)  Xanax 0.5 Mg Tabs (Alprazolam) .... Take 1 tablet by mouth two  times a day (office visit needed for refills) 2)  Temazepam 15 Mg Caps (Temazepam) .[name].. 1po at bedtime as needed 3)  Proair Hfa 108 (90 Base) Mcg/act Aers (Albuterol sulfate) .... 2 inh q4h as needed shortness of breath 4)  Vesicare 5 Mg Tabs (Solifenacin succinate) .[name].. 1 by mouth qd 5)  Vitamin D3 1000 Unit Tabs (Cholecalciferol) .... 2 qd 6)  Mobic 15 Mg Tabs (Meloxicam) .[name].. 1 daily 7)  Vyvanse 50 Mg Caps (Lisdexamfetamine dimesylate) .[name].. 1 by mouth qam. please,  fill on or after  02/05/09       . ok to fill 1 day earlier when the month has 31 days in it. 8)   Triamcinolone Acetonide 0.1 % Oint (Triamcinolone acetonide) .... Use 1-2 times a day 9)  Hyzaar 100-25 Mg Tabs (Losartan potassium-hctz) .[name].. 1 by mouth qd 10)  Paroxetine Hcl 10 Mg Tabs (Paroxetine hcl) .[name].. 1 by mouth once daily for depression 11)  Promethazine-codeine 6.25-10 Mg/3ml Syrp (Promethazine-codeine) .... 5-10 ml by mouth q id as needed cough 12)  Adderall 10 Mg Tabs (Amphetamine-dextroamphetamine) .[name].. 1 by mouth two times a day (am and lunch) 13)  Doxycycline Hyclate 100 Mg Caps (Doxycycline hyclate) .[name].. 1 by mouth two times a day with a glass of water 14)  Mupirocin 2 % Oint (Mupirocin) .... Use two times a day w/dressing change 15)  Hydrocodone-acetaminophen 5-325 Mg Tabs (Hydrocodone-acetaminophen) .[name].. 1-2 by mouth two times a day as needed pain

## 2010-02-27 NOTE — Progress Notes (Signed)
Summary: refill  Phone Note Refill Request Message from:  Fax from Pharmacy on January 30, 2009 11:55 AM  Refills Requested: Medication #1:  TEMAZEPAM 15 MG CAPS 1po at bedtime as needed   Supply Requested: 1 month Initial call taken by: Rock Nephew CMA,  January 30, 2009 11:56 AM    Prescriptions: TEMAZEPAM 15 MG CAPS (TEMAZEPAM) 1po at bedtime as needed  #30 x 6   Entered by:   Rock Nephew CMA   Authorized by:   Tresa Garter MD   Signed by:   Rock Nephew CMA on 01/30/2009   Method used:   Telephoned to ...       [id number]       [address]       [place], [place]  [address]       Ph: [phone] or [phone]       Fax: [phone]   RxID:   [id number]

## 2010-02-27 NOTE — Progress Notes (Signed)
  Phone Note Other Incoming   Request: Send information Summary of Call: Received a completed [place] Medical Release form. Request forwarded to [employer].

## 2010-02-27 NOTE — Progress Notes (Signed)
Summary: "Shot"  Phone Note Call from Patient   Summary of Call: Patient is requesting a "shot" to help her get better, says she has only gotten worse since office visit.  11-03-09  fax requset for Prometh/codeine syrup    1-2 tsp by mouth qid as needed cough  .  ok to RF??..Lanier Prude, [facility])  November 03, 2009 11:37 AM [facility] Initial call taken by: Lamar Sprinkles, CMA,  November 02, 2009 3:44 PM  Follow-up for Phone Call        ok to ref [facility] to come for Depomedrol 120 mg im Follow-up by: Tresa Garter MD,  November 03, 2009 1:00 PM  Additional Follow-up for Phone Call Additional follow up Details #1::        Pt informed, scheduled for office visit today Additional Follow-up by: Lamar Sprinkles, CMA,  November 03, 2009 1:58 PM

## 2010-02-27 NOTE — Progress Notes (Signed)
Summary: OV?   Phone Note Call from Patient   Summary of Call: Pt c/o "dizzy spells" upon standing x 3 days. Is this a side effect of her medications or do you want to see pt for office visit?  Initial call taken by: Lamar Sprinkles, CMA,  October 03, 2009 3:11 PM  Follow-up for Phone Call        Possible side effect of meds vs other OV if not resolved Follow-up by: Tresa Garter MD,  October 03, 2009 4:47 PM  Additional Follow-up for Phone Call Additional follow up Details #1::        Pt informed  Additional Follow-up by: Lamar Sprinkles, CMA,  October 03, 2009 5:05 PM

## 2010-02-27 NOTE — Progress Notes (Signed)
  Phone Note Other Incoming   Request: Send information Summary of Call: Request for records received from [facility]. Request forwarded to [employer].

## 2010-02-27 NOTE — Assessment & Plan Note (Signed)
Summary: dizzy spells--d/t---stc   Vital Signs:  Patient profile:   40 year old female Height:      66 inches Weight:      205 pounds BMI:     33.21 Temp:     97.4 degrees F oral Pulse rate:   104 / minute Pulse (ortho):   110 / minute Pulse rhythm:   regular Resp:     16 per minute BP supine:   130 / 90 BP standing:   130 / 90 Cuff size:   large  Vitals Entered By: Lanier Prude, CMA(AAMA) (October 11, 2009 11:04 AM) CC: dizziness & lightheadedness  Is Patient Diabetic? No Comments pt needs Rf on Vyvanse dates 10-28-09 and is requesting 90 day supply of Flexeril for mail  order.   She is not taking Ranitidine   CC:  dizziness & lightheadedness .  History of Present Illness: C/o dizziness x 1 wk. Worse with position change. No syncope. No LOC. SOB w/exertion  Current Medications (verified): 1)  Xanax 0.5 Mg  Tabs (Alprazolam) .... Take 1 Tablet By Mouth Two Times A Day (Office Visit Needed For Refills) 2)  Proair Hfa 108 (90 Base) Mcg/act  Aers (Albuterol Sulfate) .... 2 Inh Q4h As Needed Shortness of Breath 3)  Vesicare 5 Mg Tabs (Solifenacin Succinate) .[name].. 1 By Mouth Qd 4)  Vitamin D3 1000 Unit  Tabs (Cholecalciferol) .... 2 Qd 5)  Vyvanse 50 Mg Caps (Lisdexamfetamine Dimesylate) .[name].. 1 By Mouth Qam. Please,  Fill On or After  02/05/09       . Ok To Fill 1 Day Earlier When The Month Has 31 Days in It. 6)  Triamcinolone Acetonide 0.1 % Oint (Triamcinolone Acetonide) .... Use 1-2 Times A Day 7)  Hyzaar 100-25 Mg Tabs (Losartan Potassium-Hctz) .[name].. 1 By Mouth Qd 8)  Nebulizer .... As Dirr. Dx Asthma 493.90 9)  Albuterol Sulfate (2.5 Mg/34ml) 0.083% Nebu (Albuterol Sulfate) .... Use Qid Via Hhn Prn 10)  Benadryl 25 Mg Tabs (Diphenhydramine Hcl) .... As Directed 11)  Zolpidem Tartrate 12.5 Mg Cr-Tabs (Zolpidem Tartrate) .[name].. 1po At Bedtime As Needed 12)  Sumatriptan Succinate 100 Mg Tabs (Sumatriptan Succinate) .[name].. 1 By Mouth Every Other Day As Needed 13)  Flexeril 10 Mg Tabs  (Cyclobenzaprine Hcl) .[name].. 1 By Mouth Three Times A Day Prn 14)  Cymbalta 60 Mg Cpep (Duloxetine Hcl) .[name].. 1 By Mouth Once Daily For Depression 15)  Promethazine-Codeine 6.25-10 Mg/63ml Syrp (Promethazine-Codeine) .... 5-10 Ml By Mouth Q Id As Needed Cough 16)  Seroquel Xr 150 Mg Xr24h-Tab (Quetiapine Fumarate) .... 2 Once Daily 17)  Flector 1.3 % Ptch (Diclofenac Epolamine) 18)  Ranitidine Hcl 75 Mg/82ml Syrp (Ranitidine Hcl) .... 2 Once Daily 19)  Vimovo 500-20 Mg Tbec (Naproxen-Esomeprazole) .[name].. 1 By Mouth Once Daily - Two Times A Day Pc As Needed Pain  Allergies (verified): 1)  ! Amoxicillin (Amoxicillin) 2)  ! Lisinopril (Lisinopril) 3)  ! Hydrochlorothiazide (Hydrochlorothiazide) 4)  Doxycycline  Past History:  Social History: Last updated: 08/15/2009 Occupation:HR;waiting tables 2 d/ wk; back to school Married 2 kids Current Smoker 1 ppd 2010  Past Medical History: Anxiety Asthma Depression Hypertension INSOMNIA Gyn Dr Barnabas Lister FAMILY  H/0 DM2   V18.0 Urinary incontinence Vit D def ADD Vit D def H/o boils MRSA abscess 2010 GERD 2011 Tachycardia  Review of Systems       The patient complains of dyspnea on exertion and depression.  The patient denies anorexia, fever, weight loss, weight gain, vision  loss, decreased hearing, hoarseness, chest pain, syncope, peripheral edema, and prolonged cough.    Physical Exam  General:  alert and overweight-appearing.   Head:  normocephalic and atraumatic.   Eyes:  vision grossly intact, pupils equal, and pupils round.   Ears:  R ear normal and L ear normal.   Nose:  no external deformity and no nasal discharge.   Mouth:  no gingival abnormalities and pharynx pink and moist.   Neck:  supple and no masses.   Lungs:  normal respiratory effort and normal breath sounds.   Heart:  Tachy and regular HR 110-120 at rest.  Dizzy when stood up  Abdomen:  soft, non-tender, and normal bowel sounds.   Msk:  somewhat puffy bilat  MCP's;  no other signifcant joint tender or swelling Extremities:  no edema, no erythema  Neurologic:  cranial nerves II-XII intact and strength normal in all extremities.  Leg shaking when sitting. H-P (-) B Skin:  color normal and no rashes.   Psych:    Oriented X3, not suicidal, not homicidal, depressed affect and moderately anxious.     Impression & Recommendations:  Problem # 1:  DIZZINESS (ICD-780.4) due to #2 Assessment New  Her updated medication list for this problem includes:    Benadryl 25 Mg Tabs (Diphenhydramine hcl) .[name]... As directed  Problem # 2:  TACHYCARDIA (ICD-785.0) Assessment: Deteriorated  EKG reviewed Decrease Vyvance May need to cut back on Seroquel  Orders: EKG w/ Interpretation (93000)  Problem # 3:  HYPERTENSION (ICD-401.9) Assessment: Improved  Her updated medication list for this problem includes:    Hyzaar 100-25 Mg Tabs (Losartan potassium-hctz) .....1/2 a day or hold    Toprol Xl 50 Mg Xr24h-tab (Metoprolol succinate) .[name]... 1 by mouth once daily  Problem # 4:  STRESS DISORDER (ICD-V62.89) Assessment: Unchanged  Problem # 5:  DEPRESSION (ICD-311) Assessment: Unchanged Psych f/u next wk Her updated medication list for this problem includes:    Xanax 0.5 Mg Tabs (Alprazolam) .[name]... Take 1 tablet by mouth two times a day (office visit needed for refills)    Cymbalta 60 Mg Cpep (Duloxetine hcl) .[name]... 1 by mouth once daily for depression  Complete Medication List: 1)  Xanax 0.5 Mg Tabs (Alprazolam) .... Take 1 tablet by mouth two times a day (office visit needed for refills) 2)  Proair Hfa 108 (90 Base) Mcg/act Aers (Albuterol sulfate) .... 2 inh q4h as needed shortness of breath 3)  Vesicare 5 Mg Tabs (Solifenacin succinate) .[name].. 1 by mouth qd 4)  Vitamin D3 1000 Unit Tabs (Cholecalciferol) .... 2 qd 5)  Triamcinolone Acetonide 0.1 % Oint (Triamcinolone acetonide) .... Use 1-2 times a day 6)  Hyzaar 100-25 Mg Tabs (Losartan potassium-hctz)  .[name].. 1 by mouth qd 7)  Albuterol Sulfate (2.5 Mg/64ml) 0.083% Nebu (Albuterol sulfate) .... Use qid via hhn prn 8)  Benadryl 25 Mg Tabs (Diphenhydramine hcl) .... As directed 9)  Zolpidem Tartrate 12.5 Mg Cr-tabs (Zolpidem tartrate) .[name].. 1po at bedtime as needed 10)  Sumatriptan Succinate 100 Mg Tabs (Sumatriptan succinate) .[name].. 1 by mouth every other day as needed 11)  Flexeril 10 Mg Tabs (Cyclobenzaprine hcl) .[name].. 1 by mouth three times a day prn 12)  Cymbalta 60 Mg Cpep (Duloxetine hcl) .[name].. 1 by mouth once daily for depression 13)  Seroquel Xr 150 Mg Xr24h-tab (Quetiapine fumarate) .... 2 once daily 14)  Flector 1.3 % Ptch (Diclofenac epolamine) 15)  Ranitidine Hcl 75 Mg/73ml Syrp (Ranitidine hcl) .... 2 once daily  16)  Vimovo 500-20 Mg Tbec (Naproxen-esomeprazole) .[name].. 1 by mouth once daily - two times a day pc as needed pain 17)  Toprol Xl 50 Mg Xr24h-tab (Metoprolol succinate) .[name].. 1 by mouth once daily 18)  Vyvanse 40 Mg Caps (Lisdexamfetamine dimesylate) .[name].. 1 by mouth qd 19)  Nebulizer  .... As dirr. dx asthma 493.90  Patient Instructions: 1)  Keep return office visit  3 wks 2)  Call if you are not better in a reasonable amount of time or if worse. Go to ER if feeling really bad!  Prescriptions: FLEXERIL 10 MG TABS (CYCLOBENZAPRINE HCL) 1 by mouth three times a day prn  #270 x 1   Entered and Authorized by:   Tresa Garter MD   Signed by:   Tresa Garter MD on 10/11/2009   Method used:   Print then Give to Patient   RxID:   [id number] VYVANSE 40 MG CAPS (LISDEXAMFETAMINE DIMESYLATE) 1 by mouth qd  #30 x 0   Entered and Authorized by:   Tresa Garter MD   Signed by:   Tresa Garter MD on 10/11/2009   Method used:   Print then Give to Patient   RxID:   [id number] TOPROL XL 50 MG XR24H-TAB (METOPROLOL SUCCINATE) 1 by mouth once daily  #30 x 11   Entered and Authorized by:   Tresa Garter MD   Signed by:   Tresa Garter MD on  10/11/2009   Method used:   [facility]        [facility]       [address]       [place], [place]  [address]       Ph: [phone]       Fax: [phone]   RxID:   [id number]

## 2010-02-27 NOTE — Assessment & Plan Note (Signed)
Summary: head/chest cold/cd   Vital Signs:  Patient profile:   40 year old female Height:      66 inches Weight:      207.25 pounds BMI:     33.57 O2 Sat:      95 % on Room air Temp:     97.6 degrees F oral Pulse rate:   95 / minute BP sitting:   112 / 80  (left arm) Cuff size:   regular  Vitals Entered By: Lucious Groves (Jun 13, 2009 4:34 PM)  O2 Flow:  Room air CC: Rtn ov--C/O congestion, head cold, and cough x2 weeks./kb Is Patient Diabetic? No Pain Assessment Patient in pain? no      Comments Patient notes that she has had headache and is producing green mucous. Patient denies fever. Also requests shot./kb   CC:  Rtn ov--C/O congestion, head cold, and and cough x2 weeks./kb.  History of Present Illness: The patient presents with complaints of sore throat, fever, cough, sinus congestion and drainge of 10-14 days duration. Not better with OTC meds. Chest hurts with coughing. Can't sleep due to cough. Muscle aches are present.  The mucus is colored.   Current Medications (verified): 1)  Xanax 0.5 Mg  Tabs (Alprazolam) .... Take 1 Tablet By Mouth Two Times A Day (Office Visit Needed For Refills) 2)  Temazepam 15 Mg Caps (Temazepam) .[name].. 1po At Bedtime As Needed 3)  Proair Hfa 108 (90 Base) Mcg/act  Aers (Albuterol Sulfate) .... 2 Inh Q4h As Needed Shortness of Breath 4)  Vesicare 5 Mg Tabs (Solifenacin Succinate) .[name].. 1 By Mouth Qd 5)  Vitamin D3 1000 Unit  Tabs (Cholecalciferol) .... 2 Qd 6)  Mobic 15 Mg Tabs (Meloxicam) .[name].. 1 Daily 7)  Vyvanse 50 Mg Caps (Lisdexamfetamine Dimesylate) .[name].. 1 By Mouth Qam. Please,  Fill On or After  02/05/09       . Ok To Fill 1 Day Earlier When The Month Has 31 Days in It. 8)  Triamcinolone Acetonide 0.1 % Oint (Triamcinolone Acetonide) .... Use 1-2 Times A Day 9)  Hyzaar 100-25 Mg Tabs (Losartan Potassium-Hctz) .[name].. 1 By Mouth Qd 10)  Paroxetine Hcl 10 Mg Tabs (Paroxetine Hcl) .[name].. 1 By Mouth Once Daily For Depression 11)  Adderall 10 Mg  Tabs (Amphetamine-Dextroamphetamine) .[name].. 1 By Mouth Two Times A Day (Am and Lunch) 12)  Hydrocodone-Acetaminophen 5-325 Mg Tabs (Hydrocodone-Acetaminophen) .[name].. 1-2 By Mouth Two Times A Day As Needed Pain 13)  Bactrim 400-80 Mg Tabs (Sulfamethoxazole-Trimethoprim) .... 2 Qd 14)  Tussionex Pennkinetic Er 8-10 Mg/17ml Lqcr (Chlorpheniramine-Hydrocodone) .... 5 Ml By Mouth Two Times A Day As Needed Cough 15)  Nebulizer .... As Dirr. Dx Asthma 493.90 16)  Albuterol Sulfate (2.5 Mg/18ml) 0.083% Nebu (Albuterol Sulfate) .... Use Qid Via Hhn Prn 17)  Benadryl 25 Mg Tabs (Diphenhydramine Hcl) .... As Directed 18)  Mucinex Dm 30-600 Mg Xr12h-Tab (Dextromethorphan-Guaifenesin) .... As Directed 19)  Nasal Spray (Otc) .... As Directed  Allergies (verified): 1)  ! Amoxicillin (Amoxicillin) 2)  ! Lisinopril (Lisinopril) 3)  ! Hydrochlorothiazide (Hydrochlorothiazide) 4)  Doxycycline  Past History:  Past Medical History: Last updated: 05/02/2009 Anxiety Asthma Depression Hypertension INSOMNIA Gyn Dr Barnabas Lister FAMILY  H/0 DM2   V18.0 Urinary incontinence Vit D def ADD Vit D def H/o boils MRSA abscess 2010  Past Surgical History: Last updated: 05/02/2009 Hysterectomy partial  07 Tonsillectomy  04-29-00 ABD [place]  10-12-97 I&D R axilla MRSA abscess Dr Purnell Shoemaker 2011  Social History: Last updated:  07/27/2008 Occupation: Married 2 kids Current Smoker 1 ppd 2010  Review of Systems       The patient complains of fever, dyspnea on exertion, and prolonged cough.    Physical Exam  General:  NAD, overweight-appearing.   Nose:  External nasal examination shows no deformity Mouth:  Erythematous throat mucosa and intranasal erythema.  Lungs:  CTA Heart:  RRR Abdomen:  S/NT Msk:  No deformity or scoliosis noted of thoracic or lumbar spine.   Extremities:  No clubbing, cyanosis, edema, or deformity noted with normal full range of motion of all joints.   Neurologic:  alert & oriented X3.     Skin:  1.5x1 cm granulating wound R  axilla w/surrounding dermatitis - 10 cm; no infiltrate; no pus   Impression & Recommendations:  Problem # 1:  UPPER RESPIRATORY INFECTION, ACUTE (ICD-465.9) Assessment Deteriorated  The following medications were removed from the medication list:    Promethazine-codeine 6.25-10 Mg/32ml Syrp (Promethazine-codeine) .[name]... 5-10 ml by mouth q id as needed cough Her updated medication list for this problem includes:    Mobic 15 Mg Tabs (Meloxicam) .[name]... 1 daily    Tussionex Pennkinetic Er 8-10 Mg/71ml Lqcr (Chlorpheniramine-hydrocodone) .Marland KitchenMarland KitchenMarland KitchenMarland Kitchen 5 ml by mouth two times a day as needed cough    Benadryl 25 Mg Tabs (Diphenhydramine hcl) .[name]... As directed    Mucinex Dm 30-600 Mg Xr12h-tab (Dextromethorphan-guaifenesin) .[name]... As directed    Tessalon Perles 100 Mg Caps (Benzonatate) .[name]... 1-2 by mouth two times a day as needed cogh  Orders: Admin of Therapeutic Inj  intramuscular or subcutaneous (16109) Depo- Medrol 80mg  (J1040) Depo- Medrol 40mg  (J1030)  Problem # 2:  ASTHMA (ICD-493.90) Assessment: Deteriorated  Her updated medication list for this problem includes:    Proair Hfa 108 (90 Base) Mcg/act Aers (Albuterol sulfate) .[name]... 2 inh q4h as needed shortness of breath    Albuterol Sulfate (2.5 Mg/68ml) 0.083% Nebu (Albuterol sulfate) ..... Use qid via hhn prn    Prednisone 10 Mg Tabs (Prednisone) .[name]... Take 40mg  qd for 3 days, then 20 mg qd for 3 days, then 10mg  qd for 6 days, then stop. take pc.  Problem # 3:  ADD (ICD-314.00) Assessment: Comment Only She went off meds while sick  Complete Medication List: 1)  Xanax 0.5 Mg Tabs (Alprazolam) .... Take 1 tablet by mouth two times a day (office visit needed for refills) 2)  Temazepam 15 Mg Caps (Temazepam) .[name].. 1po at bedtime as needed 3)  Proair Hfa 108 (90 Base) Mcg/act Aers (Albuterol sulfate) .... 2 inh q4h as needed shortness of breath 4)  Vesicare 5 Mg Tabs (Solifenacin succinate) .[name].. 1 by mouth  qd 5)  Vitamin D3 1000 Unit Tabs (Cholecalciferol) .... 2 qd 6)  Mobic 15 Mg Tabs (Meloxicam) .[name].. 1 daily 7)  Vyvanse 50 Mg Caps (Lisdexamfetamine dimesylate) .[name].. 1 by mouth qam. please,  fill on or after  02/05/09       . ok to fill 1 day earlier when the month has 31 days in it. 8)  Triamcinolone Acetonide 0.1 % Oint (Triamcinolone acetonide) .... Use 1-2 times a day 9)  Hyzaar 100-25 Mg Tabs (Losartan potassium-hctz) .[name].. 1 by mouth qd 10)  Paroxetine Hcl 10 Mg Tabs (Paroxetine hcl) .[name].. 1 by mouth once daily for depression 11)  Adderall 10 Mg Tabs (Amphetamine-dextroamphetamine) .[name].. 1 by mouth two times a day (am and lunch) 12)  Hydrocodone-acetaminophen 5-325 Mg Tabs (Hydrocodone-acetaminophen) .[name].. 1-2 by mouth two times a day as needed  pain 13)  Bactrim 400-80 Mg Tabs (Sulfamethoxazole-trimethoprim) .... 2 qd 14)  Tussionex Pennkinetic Er 8-10 Mg/52ml Lqcr (Chlorpheniramine-hydrocodone) .... 5 ml by mouth two times a day as needed cough 15)  Nebulizer  .... As dirr. dx asthma 493.90 16)  Albuterol Sulfate (2.5 Mg/60ml) 0.083% Nebu (Albuterol sulfate) .... Use qid via hhn prn 17)  Benadryl 25 Mg Tabs (Diphenhydramine hcl) .... As directed 18)  Mucinex Dm 30-600 Mg Xr12h-tab (Dextromethorphan-guaifenesin) .... As directed 19)  Nasal Spray (otc)  .... As directed 20)  Levaquin 500 Mg Tabs (Levofloxacin) .[name].. 1 by mouth qd 21)  Prednisone 10 Mg Tabs (Prednisone) .... Take 40mg  qd for 3 days, then 20 mg qd for 3 days, then 10mg  qd for 6 days, then stop. take pc. 22)  Sudafed 12 Hour 120 Mg Xr12h-tab (Pseudoephedrine hcl) .[name].. 1 by mouth two times a day as needed allergies 23)  Tessalon Perles 100 Mg Caps (Benzonatate) .[name].. 1-2 by mouth two times a day as needed cogh 24)  Diflucan 150 Mg Tabs (Fluconazole) .[name].. 1 by mouth once daily once for yeast infection  Patient Instructions: 1)  Use over-the-counter medicines for "cold": Tylenol  650mg  or Advil 400mg  every 6 hours  for fever; Delsym or  Robutussin for cough. Mucinex or Mucinex D for congestion. Ricola or Halls for sore throat. Office visit if not better or if worse.  Prescriptions: DIFLUCAN 150 MG TABS (FLUCONAZOLE) 1 by mouth once daily once for yeast infection  #3 x 1   Entered and Authorized by:   Tresa Garter MD   Signed by:   Tresa Garter MD on 06/13/2009   Method used:   Print then Give to Patient   RxID:   [id number] TESSALON PERLES 100 MG CAPS (BENZONATATE) 1-2 by mouth two times a day as needed cogh  #120 x 1   Entered and Authorized by:   Tresa Garter MD   Signed by:   Tresa Garter MD on 06/13/2009   Method used:   Print then Give to Patient   RxID:   [id number] SUDAFED 12 HOUR 120 MG XR12H-TAB (PSEUDOEPHEDRINE HCL) 1 by mouth two times a day as needed allergies  #60 x 1   Entered and Authorized by:   Tresa Garter MD   Signed by:   Tresa Garter MD on 06/13/2009   Method used:   Print then Give to Patient   RxID:   [id number] PREDNISONE 10 MG TABS (PREDNISONE) Take 40mg  qd for 3 days, then 20 mg qd for 3 days, then 10mg  qd for 6 days, then stop. Take pc.  #24 x 1   Entered and Authorized by:   Tresa Garter MD   Signed by:   Tresa Garter MD on 06/13/2009   Method used:   Print then Give to Patient   RxID:   [id number] LEVAQUIN 500 MG TABS (LEVOFLOXACIN) 1 by mouth qd  #10 x 1   Entered and Authorized by:   Tresa Garter MD   Signed by:   Tresa Garter MD on 06/13/2009   Method used:   Print then Give to Patient   RxID:   [id number] TUSSIONEX PENNKINETIC ER 8-10 MG/5ML LQCR (CHLORPHENIRAMINE-HYDROCODONE) 5 ml by mouth two times a day as needed cough  #100 ml x 0   Entered and Authorized by:   Tresa Garter MD   Signed by:   Tresa Garter MD on 06/13/2009   Method used:  Print then Give to Patient   RxID:   [id number] DIFLUCAN 150 MG TABS (FLUCONAZOLE) 1 by mouth once daily once for yeast  infection  #3 x 1   Entered and Authorized by:   Tresa Garter MD   Signed by:   Tresa Garter MD on 06/13/2009   Method used:   [facility]       [address]       [place], [place]  [address]       Ph: [phone]       Fax: [phone]   RxID:   [id number] TESSALON PERLES 100 MG CAPS (BENZONATATE) 1-2 by mouth two times a day as needed cogh  #120 x 1   Entered and Authorized by:   Tresa Garter MD   Signed by:   Tresa Garter MD on 06/13/2009   Method used:   [facility]        [facility]       [address]       [place], [place]  [address]       Ph: [phone]       Fax: [phone]   RxID:   [id number] SUDAFED 12 HOUR 120 MG XR12H-TAB (PSEUDOEPHEDRINE HCL) 1 by mouth two times a day as needed allergies  #60 x 1   Entered and Authorized by:   Tresa Garter MD   Signed by:   Tresa Garter MD on 06/13/2009   Method used:   [facility]        [facility]       [address]       [place], [place]  [address]       Ph: [phone]       Fax: [phone]   RxID:   [id number] PREDNISONE 10 MG TABS (PREDNISONE) Take 40mg  qd for 3 days, then 20 mg qd for 3 days, then 10mg  qd for 6 days, then stop. Take pc.  #24 x 1   Entered and Authorized by:   Tresa Garter MD   Signed by:   Tresa Garter MD on 06/13/2009   Method used:   [facility]        [facility]       [address]       [place], [place]  [address]       Ph: [phone]       Fax: [phone]   RxID:   [id number] LEVAQUIN 500 MG TABS (LEVOFLOXACIN) 1 by mouth qd  #10 x 1   Entered and Authorized by:   Tresa Garter MD   Signed by:   Tresa Garter MD on 06/13/2009   Method used:    [facility]        [facility]       [address]       [place], [place]  [address]       Ph: [phone]       Fax: [phone]   RxID:   [id number] DIFLUCAN 150 MG TABS (FLUCONAZOLE) 1 by mouth once daily once for yeast infection  #3 x  1   Entered and Authorized by:   Tresa Garter MD   Signed by:   Tresa Garter MD on 06/13/2009   Method used:   Electronically to        [facility]  [address] [id number] (retail)       [address]       [place], [place]  [address]       Ph: [phone] or [phone]       Fax: [phone]   RxID:   [id number] TESSALON PERLES 100 MG CAPS (BENZONATATE) 1-2 by mouth two times a day as needed cogh  #120 x 1   Entered and Authorized by:   Tresa Garter MD   Signed by:   Tresa Garter MD on 06/13/2009   Method used:   Electronically to        [facility] [id number] (retail)       [address]       [place], [place]  [address]       Ph: [phone] or [phone]       Fax: [phone]   RxID:   [id number] SUDAFED 12 HOUR 120 MG XR12H-TAB (PSEUDOEPHEDRINE HCL) 1 by mouth two times a day as needed allergies  #60 x 1   Entered and Authorized by:   Tresa Garter MD   Signed by:   Tresa Garter MD on 06/13/2009   Method used:   Electronically to        [facility] [id number] (retail)       [address]       [place], [place]  [address]       Ph: [phone] or [phone]       Fax: [phone]   RxID:   [id number] PREDNISONE 10 MG TABS (PREDNISONE) Take 40mg  qd for 3 days, then 20 mg qd for 3 days, then 10mg  qd for 6 days, then stop. Take pc.  #24 x 1   Entered and Authorized by:   Tresa Garter MD   Signed by:   Tresa Garter MD on 06/13/2009   Method used:   Electronically to        [facility] [id number] (retail)       [address]       [place], [place]  [address]       Ph: [phone] or [phone]       Fax: [phone]   RxID:   [id number] LEVAQUIN 500 MG TABS (LEVOFLOXACIN) 1 by mouth qd  #10 x 1   Entered and Authorized by:   Tresa Garter MD   Signed by:   Tresa Garter MD on 06/13/2009   Method used:   Electronically to        [id number] (retail)       [address]       [place], [place]  [address]       Ph: [phone] or [phone]       Fax: [phone]   RxID:   [id number]    Medication Administration  Injection # 1:    Medication: Depo- Medrol 40mg     Diagnosis: UPPER RESPIRATORY INFECTION, ACUTE (ICD-465.9)    Route: IM  Site: RUOQ gluteus    Exp Date: 11/29/2011    Lot #: [id number]    Mfr: [employer]    Comments: 120mg      Patient tolerated injection without complications    Given by: Lamar Sprinkles, CMA (Jun 13, 2009 6:28 PM)  Injection # 2:    Medication: Depo- Medrol 80mg     Diagnosis: UPPER RESPIRATORY INFECTION, ACUTE (ICD-465.9)    Comments: SAME AS ABOVE    Patient tolerated injection without complications    Given by: Lamar Sprinkles, CMA (Jun 13, 2009 6:28 PM)  Orders Added: 1)  Admin of Therapeutic Inj  intramuscular or subcutaneous [96372] 2)  Depo- Medrol 80mg  [J1040] 3)  Depo- Medrol 40mg  [J1030] 4)  Est. Patient Level IV [16109]

## 2010-02-27 NOTE — Assessment & Plan Note (Signed)
Summary: 2-3 WK ROV /NWS  #   Vital Signs:  Patient profile:   40 year old female Height:      66 inches Weight:      210 pounds BMI:     34.02 Temp:     98.5 degrees F oral Pulse rate:   80 / minute Pulse rhythm:   regular Resp:     16 per minute BP sitting:   118 / 78  (left arm) Cuff size:   large  Vitals Entered By: Lanier Prude, CMA(AAMA) (November 24, 2009 1:13 PM) CC: f/u c/o hot flashes and dizziness Comments pt is not taking  prednisone   CC:  f/u c/o hot flashes and dizziness.  History of Present Illness: The patient presents for a follow up of back pain, anxiety, depression and headaches. C/o hot flashes, occasional confusion  Preventive Screening-Counseling & Management  Alcohol-Tobacco     Smoking Status: current  Current Medications (verified): 1)  Proair Hfa 108 (90 Base) Mcg/act  Aers (Albuterol Sulfate) .... 2 Inh Q4h As Needed Shortness of Breath 2)  Vitamin D3 1000 Unit  Tabs (Cholecalciferol) .... 2 Qd 3)  Triamcinolone Acetonide 0.1 % Oint (Triamcinolone Acetonide) .... Use 1-2 Times A Day 4)  Hyzaar 100-25 Mg Tabs (Losartan Potassium-Hctz) .[name].. 1 By Mouth Qd 5)  Albuterol Sulfate (2.5 Mg/38ml) 0.083% Nebu (Albuterol Sulfate) .... Use Qid Via Hhn Prn 6)  Sumatriptan Succinate 100 Mg Tabs (Sumatriptan Succinate) .[name].. 1 By Mouth Every Other Day As Needed 7)  Flexeril 10 Mg Tabs (Cyclobenzaprine Hcl) .[name].. 1 By Mouth Three Times A Day Prn 8)  Cymbalta 30 Mg Cpep (Duloxetine Hcl) .... 3 Once Daily 9)  Seroquel Xr 150 Mg Xr24h-Tab (Quetiapine Fumarate) .[name].. 1 Once Daily 10)  Flector 1.3 % Ptch (Diclofenac Epolamine) 11)  Vimovo 500-20 Mg Tbec (Naproxen-Esomeprazole) .[name].. 1 By Mouth Once Daily - Two Times A Day Pc As Needed Pain 12)  Vyvanse 40 Mg Caps (Lisdexamfetamine Dimesylate) .[name].. 1 By Mouth Qd 13)  Nebulizer .... As Dirr. Dx Asthma 493.90 14)  Abilify 2 Mg Tabs (Aripiprazole) .[name].. 1 By Mouth By Mouth Qd 15)  Tussionex Pennkinetic Er 8-10 Mg/33ml Lqcr  (Chlorpheniramine-Hydrocodone) .... 5 Ml By Mouth Two Times A Day As Needed Cough 16)  Toprol Xl 100 Mg Xr24h-Tab (Metoprolol Succinate) .[name].. 1 By Mouth Bid 17)  Prednisone 10 Mg Tabs (Prednisone) .... Take 40mg  Qd For 3 Days, Then 20 Mg Qd For 3 Days, Then 10mg  Qd For 6 Days, Then Stop. Take Pc. 18)  Alvesco 160 Mcg/act Aers (Ciclesonide) .[name].. 1 Spr Bid 19)  Klonopin 1 Mg Tabs (Clonazepam) .[name].. 1 By Mouth Tid 20)  Promethazine-Codeine 6.25-10 Mg/64ml Syrp (Promethazine-Codeine) .... 5-10 Ml By Mouth Q Id As Needed Cough 21)  Vesicare 10 Mg Tabs (Solifenacin Succinate) .[name].. 1 By Mouth Qd 22)  Ciprofloxacin Hcl 250 Mg Tabs (Ciprofloxacin Hcl) .[name].. 1 By Mouth Two Times A Day For Cystitis 23)  Silenor 6 Mg Tabs (Doxepin Hcl) .[name].. 1 By Mouth At Bedtime 24)  Doxepin Hcl 50 Mg Caps (Doxepin Hcl) .[name].. 1 By Mouth At Bedtime  Allergies (verified): 1)  ! Amoxicillin (Amoxicillin) 2)  ! Lisinopril (Lisinopril) 3)  ! Hydrochlorothiazide (Hydrochlorothiazide) 4)  Doxycycline  Past History:  Social History: Last updated: 11/24/2009 Occupation: HR;waiting tables 2 d/ wk; back to school, filed for disability, lost her job in 2011 Married 2 kids Current Smoker 1 ppd 2010 1/2 ppd  Past Medical History: Anxiety Asthma Depression [facility]  [facility] Hypertension INSOMNIA Gyn Dr Barnabas Lister FAMILY  H/0 DM2   V18.0 Urinary incontinence Vit D def ADD Vit D def H/o boils MRSA abscess 2010 GERD 2011 Tachycardia Depression  Social History: Occupation: HR;waiting tables 2 d/ wk; back to school, filed for disability, 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    Physical Exam  General:  alert and overweight-appearing.   Nose:  no external deformity and no nasal discharge.   Mouth:  no gingival abnormalities and pharynx erythematous  and moist.   Neck:  supple  and no masses.   Lungs:  normal respiratory effort and normal breath sounds.   Heart:  Tachy and regular HR 110-120 at rest.  Abdomen:  soft, non-tender, and normal bowel sounds.   Genitalia:  Horisontal 3x1 cm tender induration w/erythema suprapubically Msk:  somewhat puffy bilat MCP's;  no other signifcant joint tender or swelling Extremities:  no edema, no erythema  Neurologic:  cranial nerves II-XII intact and strength normal in all extremities.  Leg shaking when sitting. H-P (-) B Skin:  color normal and no rashes.   Psych:    Oriented X3, not suicidal, not homicidal, depressed affect and anxious.     Impression & Recommendations:  Problem # 1:  TACHYCARDIA (ICD-785.0) Assessment Unchanged On the regimen of medicine(s) reflected in the chart    Problem # 2:  STRESS DISORDER (ICD-V62.89) Assessment: Improved On the regimen of medicine(s) reflected in the chart    Problem # 3:  COMMON MIGRAINE (ICD-346.10) Assessment: Improved  Her updated medication list for this problem includes:    Sumatriptan Succinate 100 Mg Tabs (Sumatriptan succinate) .[name]... 1 by mouth every other day as needed    Vimovo 500-20 Mg Tbec (Naproxen-esomeprazole) .[name]... 1 by mouth once daily - two times a day pc as needed pain    Toprol Xl 100 Mg Xr24h-tab (Metoprolol succinate) .[name]... 1 by mouth bid  Problem # 4:  COUGH (ICD-786.2) resolving Assessment: Improved  Problem # 5:  ANXIETY (ICD-300.00) Assessment: Unchanged  Her updated medication list for this problem includes:    Cymbalta 30 Mg Cpep (Duloxetine hcl) .Marland KitchenMarland KitchenMarland KitchenMarland Kitchen 3 once daily    Klonopin 1 Mg Tabs (Clonazepam) .[name]... 1 by mouth tid    Doxepin Hcl 50 Mg Caps (Doxepin hcl) .[name]... 1 by mouth at bedtime  Problem # 6:  ADD (ICD-314.00) Assessment: Unchanged  Problem # 7:  DEPRESSION (ICD-311) Assessment: Unchanged Per Psychiatry.  She will discuss confusion with Psych (it is related to multiple meds). No driving if not feeling clear. Cut back on  Klonopin. Her updated medication list for this problem includes:    Cymbalta 30 Mg Cpep (Duloxetine hcl) .Marland KitchenMarland KitchenMarland KitchenMarland Kitchen 3 once daily    Klonopin 1 Mg Tabs (Clonazepam) .[name]... 1 by mouth tid    Doxepin Hcl 50 Mg Caps (Doxepin hcl) .[name]... 1 by mouth at bedtime  Complete Medication List: 1)  Proair Hfa 108 (90 Base) Mcg/act Aers (Albuterol sulfate) .... 2 inh q4h as needed shortness of breath 2)  Vitamin D3 1000 Unit Tabs (Cholecalciferol) .... 2 qd 3)  Triamcinolone Acetonide 0.1 % Oint (Triamcinolone acetonide) .... Use 1-2 times a day 4)  Hyzaar 100-25 Mg Tabs (Losartan potassium-hctz) .[name].. 1 by mouth qd 5)  Albuterol Sulfate (2.5 Mg/22ml) 0.083% Nebu (Albuterol sulfate) .... Use qid via hhn prn 6)  Sumatriptan Succinate  100 Mg Tabs (Sumatriptan succinate) .[name].. 1 by mouth every other day as needed 7)  Flexeril 10 Mg Tabs (Cyclobenzaprine hcl) .[name].. 1 by mouth three times a day prn 8)  Cymbalta 30 Mg Cpep (Duloxetine hcl) .... 3 once daily 9)  Seroquel Xr 150 Mg Xr24h-tab (Quetiapine fumarate) .[name].. 1 once daily 10)  Flector 1.3 % Ptch (Diclofenac epolamine) 11)  Vimovo 500-20 Mg Tbec (Naproxen-esomeprazole) .[name].. 1 by mouth once daily - two times a day pc as needed pain 12)  Vyvanse 40 Mg Caps (Lisdexamfetamine dimesylate) .[name].. 1 by mouth qd 13)  Nebulizer  .... As dirr. dx asthma 493.90 14)  Abilify 2 Mg Tabs (Aripiprazole) .[name].. 1 by mouth by mouth qd 15)  Tussionex Pennkinetic Er 8-10 Mg/72ml Lqcr (Chlorpheniramine-hydrocodone) .... 5 ml by mouth two times a day as needed cough 16)  Toprol Xl 100 Mg Xr24h-tab (Metoprolol succinate) .[name].. 1 by mouth bid 17)  Klonopin 1 Mg Tabs (Clonazepam) .[name].. 1 by mouth tid 18)  Vesicare 10 Mg Tabs (Solifenacin succinate) .[name].. 1 by mouth qd 19)  Ciprofloxacin Hcl 250 Mg Tabs (Ciprofloxacin hcl) .[name].. 1 by mouth two times a day for cystitis 20)  Silenor 6 Mg Tabs (Doxepin hcl) .[name].. 1 by mouth at bedtime 21)  Doxepin Hcl 50 Mg Caps (Doxepin hcl) .[name].. 1 by mouth at  bedtime 22)  Symbicort 160-4.5 Mcg/act Aero (Budesonide-formoterol fumarate) .... 2 inh two times a day  Patient Instructions: 1)  Please schedule a follow-up appointment in 1.5  month. Prescriptions: SYMBICORT 160-4.5 MCG/ACT AERO (BUDESONIDE-FORMOTEROL FUMARATE) 2 inh two times a day  #3 x 3   Entered and Authorized by:   Tresa Garter MD   Signed by:   Tresa Garter MD on 11/24/2009   Method used:   Print then Give to Patient   RxID:   [id number] SYMBICORT 160-4.5 MCG/ACT AERO (BUDESONIDE-FORMOTEROL FUMARATE) 2 inh two times a day  #13 x 3   Entered and Authorized by:   Tresa Garter MD   Signed by:   Tresa Garter MD on 11/24/2009   Method used:   Print then Give to Patient   RxID:   [id number] SYMBICORT 160-4.5 MCG/ACT AERO (BUDESONIDE-FORMOTEROL FUMARATE) 2 inh two times a day  #1 x 3   Entered and Authorized by:   Tresa Garter MD   Signed by:   Tresa Garter MD on 11/24/2009   Method used:   Print then Give to Patient   RxID:   [id number]    Orders Added: 1)  Est. Patient Level IV [01027]

## 2010-02-27 NOTE — Progress Notes (Signed)
  Phone Note Other Incoming   Request: Send information Summary of Call: Request for records received from [facility]. Request forwarded to [facility].     

## 2010-02-27 NOTE — Progress Notes (Signed)
Summary: medication clarification  Phone Note From Pharmacy   Caller: [facility] Summary of Call: Patient requested a prescription for Zolpidem 12.5mg . Pharmacy stated the patient also has a recent history of Lunesta 3mg . It appears pt. is either taking both medications or going back and forth between the two medications.Alfonso Patten was issued on 10/12/2009 and last filled 11/10/2009 and [facility] received a prescription for zolpidem 12.5mg   #90. Please advise. Initial call taken by: Robin Ewing CMA Duncan Dull),  November 14, 2009 10:05 AM  Follow-up for Phone Call        since these are controlled meds, we should take one or the other;  to cont the lunesta as needed only Follow-up by: Corwin Levins MD,  November 14, 2009 1:08 PM  Additional Follow-up for Phone Call Additional follow up Details #1::        informed pharmacy of MD instructions. Additional Follow-up by: Robin Ewing CMA Duncan Dull),  November 14, 2009 4:18 PM

## 2010-02-27 NOTE — Letter (Signed)
Summary: [facility]  [facility]   Imported By: Lester [place] 10/24/2009 13:37:23  _____________________________________________________________________  External Attachment:    Type:   Image     Comment:   External Document

## 2010-02-27 NOTE — Assessment & Plan Note (Signed)
Summary: STILL HAS COLD/CD   Vital Signs:  Patient profile:   40 year old female Weight:      212 pounds Temp:     97.6 degrees F oral Pulse rate:   108 / minute BP sitting:   126 / 84  (left arm)  Vitals Entered By: Tora Perches (March 13, 2009 1:59 PM) CC: ongoing cough Is Patient Diabetic? No   CC:  ongoing cough.  History of Present Illness: C/o CP, cough - worse; green sputum  Preventive Screening-Counseling & Management  Alcohol-Tobacco     Smoking Status: never  Current Medications (verified): 1)  Xanax 0.5 Mg  Tabs (Alprazolam) .... Take 1 Tablet By Mouth Two Times A Day (Office Visit Needed For Refills) 2)  Temazepam 15 Mg Caps (Temazepam) .[name].. 1po At Bedtime As Needed 3)  Proair Hfa 108 (90 Base) Mcg/act  Aers (Albuterol Sulfate) .... 2 Inh Q4h As Needed Shortness of Breath 4)  Vesicare 5 Mg Tabs (Solifenacin Succinate) .[name].. 1 By Mouth Qd 5)  Vitamin D3 1000 Unit  Tabs (Cholecalciferol) .... 2 Qd 6)  Mobic 15 Mg Tabs (Meloxicam) .[name].. 1 Daily 7)  Vyvanse 50 Mg Caps (Lisdexamfetamine Dimesylate) .[name].. 1 By Mouth Qam. Please,  Fill On or After  02/05/09       . Ok To Fill 1 Day Earlier When The Month Has 31 Days in It. 8)  Triamcinolone Acetonide 0.1 % Oint (Triamcinolone Acetonide) .... Use 1-2 Times A Day 9)  Hyzaar 100-25 Mg Tabs (Losartan Potassium-Hctz) .[name].. 1 By Mouth Qd 10)  Paroxetine Hcl 10 Mg Tabs (Paroxetine Hcl) .[name].. 1 By Mouth Once Daily For Depression 11)  Promethazine-Codeine 6.25-10 Mg/3ml Syrp (Promethazine-Codeine) .... 5-10 Ml By Mouth Q Id As Needed Cough  Allergies: 1)  ! Amoxicillin (Amoxicillin) 2)  ! Lisinopril (Lisinopril) 3)  ! Hydrochlorothiazide (Hydrochlorothiazide)  Past History:  Past Medical History: Last updated: 07/27/2008 Anxiety Asthma Depression Hypertension INSOMNIA Gyn Dr Barnabas Lister FAMILY  H/0 DM2   V18.0 Urinary incontinence Vit D def ADD Vit D def  Social History: Last updated:  07/27/2008 Occupation: Married 2 kids Current Smoker 1 ppd 2010 l Physical Exam  General:  NAD, overweight-appearing.   Nose:  Erythematous throat mucosa and intranasal erythema.  Lungs:  CTA Heart:  RRR Abdomen:  S/NT Skin:  clear   Impression & Recommendations:  Problem # 1:  UPPER RESPIRATORY INFECTION, ACUTE (ICD-465.9) Assessment Deteriorated  Zpac Her updated medication list for this problem includes:    Mobic 15 Mg Tabs (Meloxicam) .[name]... 1 daily    Promethazine-codeine 6.25-10 Mg/60ml Syrp (Promethazine-codeine) .[name]... 5-10 ml by mouth q id as needed cough  Orders: Depo- Medrol 40mg  (J1030) Depo- Medrol 80mg  (J1040) Admin of Therapeutic Inj  intramuscular or subcutaneous (16109)  Problem # 2:  ASTHMA (ICD-493.90) Assessment: Deteriorated Asmanex Her updated medication list for this problem includes:    Proair Hfa 108 (90 Base) Mcg/act Aers (Albuterol sulfate) .[name]... 2 inh q4h as needed shortness of breath  Complete Medication List: 1)  Xanax 0.5 Mg Tabs (Alprazolam) .... Take 1 tablet by mouth two times a day (office visit needed for refills) 2)  Temazepam 15 Mg Caps (Temazepam) .[name].. 1po at bedtime as needed 3)  Proair Hfa 108 (90 Base) Mcg/act Aers (Albuterol sulfate) .... 2 inh q4h as needed shortness of breath 4)  Vesicare 5 Mg Tabs (Solifenacin succinate) .[name].. 1 by mouth qd 5)  Vitamin D3 1000 Unit Tabs (Cholecalciferol) .... 2 qd 6)  Mobic 15  Mg Tabs (Meloxicam) .[name].. 1 daily 7)  Vyvanse 50 Mg Caps (Lisdexamfetamine dimesylate) .[name].. 1 by mouth qam. please,  fill on or after  02/05/09       . ok to fill 1 day earlier when the month has 31 days in it. 8)  Triamcinolone Acetonide 0.1 % Oint (Triamcinolone acetonide) .... Use 1-2 times a day 9)  Hyzaar 100-25 Mg Tabs (Losartan potassium-hctz) .[name].. 1 by mouth qd 10)  Paroxetine Hcl 10 Mg Tabs (Paroxetine hcl) .[name].. 1 by mouth once daily for depression 11)  Promethazine-codeine 6.25-10 Mg/17ml Syrp (Promethazine-codeine)  .... 5-10 ml by mouth q id as needed cough 12)  Zithromax Z-pak 250 Mg Tabs (Azithromycin) .... As dirrected 13)  Diflucan 150 Mg Tabs (Fluconazole) .[name].. 1 by mouth once daily once for yeast infection  Patient Instructions: 1)  Use over-the-counter medicines for "cold": Tylenol  650mg  or Advil 400mg  every 6 hours  for fever; Delsym or Robutussin for cough. Mucinex or Mucinex D for congestion. Ricola or Halls for sore throat. Office visit if not better or if worse.  Prescriptions: PROMETHAZINE-CODEINE 6.25-10 MG/5ML SYRP (PROMETHAZINE-CODEINE) 5-10 ml by mouth q id as needed cough  #300 ml x 0   Entered and Authorized by:   Tresa Garter MD   Signed by:   Tresa Garter MD on 03/13/2009   Method used:   Print then Give to Patient   RxID:   [id number] DIFLUCAN 150 MG TABS (FLUCONAZOLE) 1 by mouth once daily once for yeast infection  #4 x 0   Entered and Authorized by:   Tresa Garter MD   Signed by:   Tresa Garter MD on 03/13/2009   Method used:   Electronically to        [facility]  [address] [id number] (retail)       [address]       [place], [place]  [address]       Ph: [phone] or [phone]       Fax: [phone]   RxID:   [id number] ZITHROMAX Z-PAK 250 MG TABS (AZITHROMYCIN) as dirrected  #1 x 0   Entered and Authorized by:   Tresa Garter MD   Signed by:   Tresa Garter MD on 03/13/2009   Method used:   [facility]ically to        [facility]  [address] [id number] (retail[facility]       [address]       [place], [place]  [address]       Ph: [phone] or [phone]       Fax: [phone]   RxID:   [id number]    Medication Administration  Injection # 1:    Medication: Depo- Medrol 40mg     Diagnosis: UPPER RESPIRATORY INFECTION, ACUTE (ICD-465.9)    Route: IM    Site: RUOQ gluteus    Exp Date: 10/2009    Lot #: [id number]    Mfr: teva    Patient tolerated injection without complications     Given by: Tora Perches (March 13, 2009 2:54 PM)  Injection # 2:    Medication: Depo- Medrol 80mg     Diagnosis: UPPER RESPIRATORY INFECTION, ACUTE (ICD-465.9)    Route: IM    Site: RUOQ gluteus    Exp Date: 10/2009    Lot #: [id number]    Mfr: teva    Patient tolerated injection without complications  Given by: Tora Perches (March 13, 2009 2:54 PM)  Orders Added: 1)  Est. Patient Level III [99213] 2)  Depo- Medrol 40mg  [J1030] 3)  Depo- Medrol 80mg  [J1040] 4)  Admin of Therapeutic Inj  intramuscular or subcutaneous [04540]

## 2010-02-27 NOTE — Letter (Signed)
Summary: [facility]  [facility]   Imported By: Lester [place] 06/28/2009 07:47:58  _____________________________________________________________________  External Attachment:    Type:   Image     Comment:   External Document

## 2010-02-27 NOTE — Letter (Signed)
Summary: [facility]  [facility]   Imported By: Sherian Rein 10/19/2009 10:00:28  _____________________________________________________________________  External Attachment:    Type:   Image     Comment:   External Document

## 2010-02-27 NOTE — Progress Notes (Signed)
Summary: REFILL - Mail to patient  Phone Note Refill Request   Refills Requested: Medication #1:  VYVANSE 40 MG CAPS 1 by mouth qd  Method Requested: Mail to Patient Initial call taken by: Lamar Sprinkles, CMA,  November 10, 2009 11:34 AM  Follow-up for Phone Call        ok Follow-up by: Tresa Garter MD,  November 10, 2009 12:53 PM  Additional Follow-up for Phone Call Additional follow up Details #1::        Mailed to patient Additional Follow-up by: Lamar Sprinkles, CMA,  November 10, 2009 3:33 PM    Prescriptions: VYVANSE 40 MG CAPS (LISDEXAMFETAMINE DIMESYLATE) 1 by mouth qd  #30 x 0   Entered by:   Lamar Sprinkles, CMA   Authorized by:   Tresa Garter MD   Signed by:   Lamar Sprinkles, CMA on 11/10/2009   Method used:   Print then Mail to Patient   RxID:   [id number]

## 2010-02-27 NOTE — Medication Information (Signed)
[facility]   Imported By: Lester [place] 04/12/2009 08:17:00  _____________________________________________________________________  External Attachment:    Type:   Image     Comment:   External Document

## 2010-03-01 NOTE — Progress Notes (Signed)
Summary: COUGH MED REQ  Phone Note Other Incoming   Caller: pt  Summary of Call: Pt states she has been having a night time cough. No fever just coughing, she would like some tussinex called in to help with this. Please Advise. Initial call taken by: Ami Bullins CMA,  February 20, 2010 9:12 AM  Follow-up for Phone Call        ok tessalon Follow-up by: Tresa Garter MD,  February 20, 2010 6:24 PM  Additional Follow-up for Phone Call Additional follow up Details #1::        Pt informed, she states that tessalon does not work -does not want rx. Prometh/cod does not wk either - wants tussionex rx. Pt c/o productive cough w/ yellow mucus and sinus drainage as the cause of the cough. Advised I would pass along message to MD but he may require office visit.  Additional Follow-up by: Lamar Sprinkles, CMA,  February 21, 2010 10:18 AM    Additional Follow-up for Phone Call Additional follow up Details #2::    ok 50 ml  ov if sick Follow-up by: Tresa Garter MD,  February 21, 2010 4:46 PM  Additional Follow-up for Phone Call Additional follow up Details #3:: Details for Additional Follow-up Action Taken: Pt informed  Additional Follow-up by: Lamar Sprinkles, CMA,  February 23, 2010 10:37 AM  New/Updated Medications: TESSALON PERLES 100 MG CAPS (BENZONATATE) 1-2 by mouth two times a day as needed cogh TUSSIONEX PENNKINETIC ER 10-8 MG/5ML LQCR (HYDROCOD POLST-CHLORPHEN POLST) 5 mL two times a day as needed Prescriptions: TUSSIONEX PENNKINETIC ER 10-8 MG/5ML LQCR (HYDROCOD POLST-CHLORPHEN POLST) 5 mL two times a day as needed  #50 mL x 0   Entered by:   Lamar Sprinkles, CMA   Authorized by:   Tresa Garter MD   Signed by:   Lamar Sprinkles, CMA on 02/23/2010   Method used:   Telephoned to ...       [facility] [address] (retail)       [address]       [place], [place]  [address]       Ph: [phone]       Fax: [phone]   RxID:    [id number]

## 2010-03-01 NOTE — Progress Notes (Signed)
Summary: VYVANSE  Phone Note Refill Request   Refills Requested: Medication #1:  VYVANSE 40 MG CAPS 1 by mouth qd Mail to pt's hm   Initial call taken by: Lamar Sprinkles, CMA,  February 21, 2010 10:17 AM  Follow-up for Phone Call        ok to ref Follow-up by: Tresa Garter MD,  February 21, 2010 4:45 PM  Additional Follow-up for Phone Call Additional follow up Details #1::        mailed Additional Follow-up by: Lamar Sprinkles, CMA,  February 22, 2010 3:23 PM    Prescriptions: VYVANSE 40 MG CAPS (LISDEXAMFETAMINE DIMESYLATE) 1 by mouth qd  #30 x 0   Entered by:   Lamar Sprinkles, CMA   Authorized by:   Tresa Garter MD   Signed by:   Lamar Sprinkles, CMA on 02/22/2010   Method used:   Print then Mail to Patient   RxID:   [id number]

## 2010-03-01 NOTE — Letter (Signed)
Summary: [facility]   Imported By: Sherian Rein 02/02/2010 11:06:16  _____________________________________________________________________  External Attachment:    Type:   Image     Comment:   External Document

## 2010-03-01 NOTE — Progress Notes (Signed)
  Phone Note Other Incoming   Request: Send information Summary of Call: Request for records received from [facility]. Request forwarded to [facility].     

## 2010-03-07 NOTE — Assessment & Plan Note (Signed)
Summary: hands,feet swollen/cough/cd   Vital Signs:  Patient profile:   40 year old female Height:      66 inches Weight:      234 pounds BMI:     37.91 Temp:     98.8 degrees F oral Pulse rate:   108 / minute Pulse rhythm:   regular Resp:     16 per minute BP sitting:   120 / 70  (left arm) Cuff size:   large  Vitals Entered By: Lanier Prude, CMA(AAMA) (February 27, 2010 3:58 PM) CC: bilateral feet edema, shooting pain in Left leg, bilateral hand numbness and persistant cough Is Patient Diabetic? No   CC:  bilateral feet edema, shooting pain in Left leg, and bilateral hand numbness and persistant cough.  History of Present Illness: C/o hands and feet swelling x 2-3 wks C/o pain in R poster leg C/o cogh at night F/u depression  Current Medications (verified): 1)  Proair Hfa 108 (90 Base) Mcg/act  Aers (Albuterol Sulfate) .... 2 Inh Q4h As Needed Shortness of Breath 2)  Vitamin D3 1000 Unit  Tabs (Cholecalciferol) .... 2 Qd 3)  Triamcinolone Acetonide 0.1 % Oint (Triamcinolone Acetonide) .... Use 1-2 Times A Day 4)  Hyzaar 100-25 Mg Tabs (Losartan Potassium-Hctz) .[name].. 1 By Mouth Qd 5)  Albuterol Sulfate (2.5 Mg/44ml) 0.083% Nebu (Albuterol Sulfate) .... Use Qid Via Hhn Prn 6)  Sumatriptan Succinate 100 Mg Tabs (Sumatriptan Succinate) .[name].. 1 By Mouth Every Other Day As Needed 7)  Flexeril 10 Mg Tabs (Cyclobenzaprine Hcl) .[name].. 1 By Mouth Three Times A Day Prn 8)  Cymbalta 30 Mg Cpep (Duloxetine Hcl) .... 3 Once Daily 9)  Seroquel Xr 150 Mg Xr24h-Tab (Quetiapine Fumarate) .[name].. 1 Once Daily 10)  Flector 1.3 % Ptch (Diclofenac Epolamine) 11)  Vimovo 500-20 Mg Tbec (Naproxen-Esomeprazole) .[name].. 1 By Mouth Once Daily - Two Times A Day Pc As Needed Pain 12)  Vyvanse 40 Mg Caps (Lisdexamfetamine Dimesylate) .[name].. 1 By Mouth Qd 13)  Nebulizer .... As Dirr. Dx Asthma 493.90 14)  Abilify 2 Mg Tabs (Aripiprazole) .[name].. 1 By Mouth By Mouth Qd 15)  Toprol Xl 100 Mg Xr24h-Tab (Metoprolol  Succinate) .[name].. 1 By Mouth Bid 16)  Klonopin 1 Mg Tabs (Clonazepam) .[name].. 1 By Mouth Tid 17)  Vesicare 10 Mg Tabs (Solifenacin Succinate) .[name].. 1 By Mouth Qd 18)  Silenor 6 Mg Tabs (Doxepin Hcl) .[name].. 1 By Mouth At Bedtime 19)  Doxepin Hcl 50 Mg Caps (Doxepin Hcl) .[name].. 1 By Mouth At Bedtime 20)  Symbicort 160-4.5 Mcg/act Aero (Budesonide-Formoterol Fumarate) .... 2 Inh Two Times A Day 21)  Tessalon Perles 100 Mg Caps (Benzonatate) .[name].. 1-2 By Mouth Two Times A Day As Needed Cogh 22)  Tussionex Pennkinetic Er 10-8 Mg/62ml Lqcr (Hydrocod Polst-Chlorphen Polst) .... 5 Ml Two Times A Day As Needed  Allergies (verified): 1)  ! Amoxicillin (Amoxicillin) 2)  ! Lisinopril (Lisinopril) 3)  ! Hydrochlorothiazide (Hydrochlorothiazide) 4)  Doxycycline  Past History:  Past Medical History: Last updated: 11/24/2009 Anxiety Asthma Depression [facility] Hypertension INSOMNIA Gyn Dr Barnabas Lister FAMILY  H/0 DM2   V18.0 Urinary incontinence Vit D def ADD Vit D def H/o boils MRSA abscess 2010 GERD 2011 Tachycardia Depression  Social History: Last updated: 11/24/2009 Occupation: HR;waiting tables 2 d/ wk; back to school, filed for disability, lost her job in 2011 Married 2 kids Current Smoker 1 ppd 2010 1/2 ppd  Review of Systems       The patient  complains of chest pain, dyspnea on exertion, abdominal pain, severe indigestion/heartburn, muscle weakness, and depression.  The patient denies weight loss and weight gain.    Physical Exam  General:  alert and overweight-appearing.   Head:  normocephalic and atraumatic.   Eyes:  vision grossly intact, pupils equal, and pupils round.   Ears:  R ear normal and L ear normal.   Nose:  no external deformity and no nasal discharge.   Mouth:  no gingival abnormalities and pharynx erythematous  and moist.   Lungs:  normal respiratory effort and normal breath sounds.   Heart:  Tachy and regular HR 110-120 at rest.  Abdomen:  soft,  non-tender, and normal bowel sounds.   Msk:  somewhat puffy bilat MCP's;  no other signifcant joint tender or swelling Neurologic:  cranial nerves II-XII intact and strength normal in all extremities.  Leg shaking when sitting. H-P (-) B Skin:  color normal and no rashes.   Psych:    Oriented X3, not suicidal, not homicidal, depressed affect and anxious.     Impression & Recommendations:  Problem # 1:  EDEMA (ICD-782.3) Assessment Improved  Her updated medication list for this problem includes:    Hyzaar 100-25 Mg Tabs (Losartan potassium-hctz) .[name]... 1 by mouth qd    Furosemide 20 Mg Tabs (Furosemide) .[name]... 1 or 2 by mouth qam as needed for swelling as needed  Problem # 2:  LEG PAIN (ICD-729.5) L ? MSK strain Assessment: New Doppler if not better  Problem # 3:  DEPRESSION (ICD-311) Assessment: Unchanged  Her updated medication list for this problem includes:    Cymbalta 30 Mg Cpep (Duloxetine hcl) .Marland KitchenMarland KitchenMarland KitchenMarland Kitchen 3 once daily    Klonopin 1 Mg Tabs (Clonazepam) .[name]... 1 by mouth tid    Doxepin Hcl 50 Mg Caps (Doxepin hcl) .[name]... 1 by mouth at bedtime  Complete Medication List: 1)  Proair Hfa 108 (90 Base) Mcg/act Aers (Albuterol sulfate) .... 2 inh q4h as needed shortness of breath 2)  Vitamin D3 1000 Unit Tabs (Cholecalciferol) .... 2 qd 3)  Triamcinolone Acetonide 0.1 % Oint (Triamcinolone acetonide) .... Use 1-2 times a day 4)  Hyzaar 100-25 Mg Tabs (Losartan potassium-hctz) .[name].. 1 by mouth qd 5)  Albuterol Sulfate (2.5 Mg/60ml) 0.083% Nebu (Albuterol sulfate) .... Use qid via hhn prn 6)  Sumatriptan Succinate 100 Mg Tabs (Sumatriptan succinate) .[name].. 1 by mouth every other day as needed 7)  Flexeril 10 Mg Tabs (Cyclobenzaprine hcl) .[name].. 1 by mouth three times a day prn 8)  Cymbalta 30 Mg Cpep (Duloxetine hcl) .... 3 once daily 9)  Seroquel Xr 150 Mg Xr24h-tab (Quetiapine fumarate) .[name].. 1 once daily 10)  Flector 1.3 % Ptch (Diclofenac epolamine) 11)  Vimovo 500-20 Mg Tbec  (Naproxen-esomeprazole) .[name].. 1 by mouth once daily - two times a day pc as needed pain 12)  Vyvanse 40 Mg Caps (Lisdexamfetamine dimesylate) .[name].. 1 by mouth qd 13)  Nebulizer  .... As dirr. dx asthma 493.90 14)  Abilify 2 Mg Tabs (Aripiprazole) .[name].. 1 by mouth by mouth qd 15)  Toprol Xl 100 Mg Xr24h-tab (Metoprolol succinate) .[name].. 1 by mouth bid 16)  Klonopin 1 Mg Tabs (Clonazepam) .[name].. 1 by mouth tid 17)  Vesicare 10 Mg Tabs (Solifenacin succinate) .[name].. 1 by mouth qd 18)  Silenor 6 Mg Tabs (Doxepin hcl) .[name].. 1 by mouth at bedtime 19)  Doxepin Hcl 50 Mg Caps (Doxepin hcl) .[name].. 1 by mouth at bedtime 20)  Symbicort 160-4.5 Mcg/act Aero (Budesonide-formoterol  fumarate) .... 2 inh two times a day 21)  Tessalon Perles 100 Mg Caps (Benzonatate) .[name].. 1-2 by mouth two times a day as needed cogh 22)  Tussionex Pennkinetic Er 10-8 Mg/58ml Lqcr (Hydrocod polst-chlorphen polst) .... 5 ml two times a day as needed 23)  Furosemide 20 Mg Tabs (Furosemide) .[name].. 1 or 2 by mouth qam as needed for swelling as needed  Patient Instructions: 1)  Call if you are not better in a reasonable amount of time or if worse.  2)  Please schedule a follow-up appointment in 3 months. Prescriptions: FUROSEMIDE 20 MG TABS (FUROSEMIDE) 1 or 2 by mouth qam as needed for swelling as needed  #60 x 3   Entered and Authorized by:   Tresa Garter MD   Signed by:   Tresa Garter MD on 02/27/2010   Method used:   Electronically to        [facility]       [address]       [place], [place]  [address]       Ph: [phone]       Fax: [phone]   RxID:   [id number]    Orders Added: 1)  Est. Patient Level IV [40102]

## 2010-03-15 NOTE — Letter (Signed)
Summary: [facility]   Imported By: Sherian Rein 03/07/2010 09:36:54  _____________________________________________________________________  External Attachment:    Type:   Image     Comment:   External Document

## 2010-03-19 ENCOUNTER — Telehealth (INDEPENDENT_AMBULATORY_CARE_PROVIDER_SITE_OTHER): Payer: Self-pay | Admitting: *Deleted

## 2010-03-21 ENCOUNTER — Encounter: Payer: Self-pay | Admitting: Internal Medicine

## 2010-03-27 NOTE — Progress Notes (Signed)
  Phone Note Other Incoming   Request: Send information Summary of Call: Request for records received from [facility] Record Retrieval. Request forwarded to [employer].  11/28/2009 to present-[name]

## 2010-03-28 ENCOUNTER — Telehealth: Payer: Self-pay | Admitting: Internal Medicine

## 2010-04-03 ENCOUNTER — Telehealth (INDEPENDENT_AMBULATORY_CARE_PROVIDER_SITE_OTHER): Payer: Self-pay | Admitting: *Deleted

## 2010-04-05 NOTE — Progress Notes (Signed)
Summary: REFILL Vyvanse  Phone Note Refill Request   Refills Requested: Medication #1:  VYVANSE 40 MG CAPS 1 by mouth qd  Method Requested: Mail to Patient Initial call taken by: Lamar Sprinkles, CMA,  March 28, 2010 10:54 AM  Follow-up for Phone Call        ok Thank you!  Follow-up by: Tresa Garter MD,  March 28, 2010 6:06 PM  Additional Follow-up for Phone Call Additional follow up Details #1::        Will mail this pm Additional Follow-up by: Lamar Sprinkles, CMA,  March 29, 2010 9:57 AM    Prescriptions: VYVANSE 40 MG CAPS (LISDEXAMFETAMINE DIMESYLATE) 1 by mouth qd  #30 x 0   Entered by:   Lamar Sprinkles, CMA   Authorized by:   Tresa Garter MD   Signed by:   Lamar Sprinkles, CMA on 03/28/2010   Method used:   Print then Mail to Patient   RxID:   [id number]

## 2010-04-05 NOTE — Letter (Signed)
Summary: [facility]   Imported By: Sherian Rein 03/30/2010 12:55:51  _____________________________________________________________________  External Attachment:    Type:   Image     Comment:   External Document

## 2010-04-10 ENCOUNTER — Telehealth: Payer: Self-pay | Admitting: Internal Medicine

## 2010-04-10 NOTE — Progress Notes (Signed)
  Phone Note Other Incoming   Request: Send information Summary of Call: Received records request from [facility], request forwarded to [employer].    

## 2010-04-17 NOTE — Progress Notes (Signed)
Summary: Rf Flexeril  Phone Note Refill Request Message from:  Pharmacy  Refills Requested: Medication #1:  FLEXERIL 10 MG TABS 1 by mouth three times a day prn   Dosage confirmed as above?Dosage Confirmed   Supply Requested: 90  Method Requested: Telephone to Pharmacy Initial call taken by: Lanier Prude, [facility]),  April 10, 2010 8:06 AM  Follow-up for Phone Call        ok 1 ref Follow-up by: Tresa Garter MD,  April 10, 2010 1:42 PM    Prescriptions: FLEXERIL 10 MG TABS (CYCLOBENZAPRINE HCL) 1 by mouth three times a day prn  #90 x 0   Entered by:   Lamar Sprinkles, CMA   Authorized by:   Tresa Garter MD   Signed by:   Lamar Sprinkles, CMA on 04/10/2010   Method used:   Electronically to        [facility]       [address]       [place], [place]  [address]       Ph: [phone]       Fax: [phone]   RxID:   [id number]

## 2010-04-23 LAB — DIFFERENTIAL
Basophils Absolute: 0 10*3/uL (ref 0.0–0.1)
Eosinophils Relative: 3 % (ref 0–5)
Lymphocytes Relative: 22 % (ref 12–46)
Lymphs Abs: 2.2 10*3/uL (ref 0.7–4.0)
Monocytes Absolute: 0.7 10*3/uL (ref 0.1–1.0)
Neutro Abs: 6.7 10*3/uL (ref 1.7–7.7)

## 2010-04-23 LAB — CBC
HCT: 43.9 % (ref 36.0–46.0)
Hemoglobin: 14.8 g/dL (ref 12.0–15.0)
RBC: 4.7 MIL/uL (ref 3.87–5.11)
WBC: 9.9 10*3/uL (ref 4.0–10.5)

## 2010-04-23 LAB — CREATININE, SERUM
Creatinine, Ser: 0.59 mg/dL (ref 0.4–1.2)
GFR calc Af Amer: >60 mL/min (ref >60)
GFR calc non Af Amer: >60 mL/min (ref >60)

## 2010-04-23 LAB — BASIC METABOLIC PANEL
Calcium: 9 mg/dL (ref 8.4–10.5)
GFR calc Af Amer: >60 mL/min (ref >60)
GFR calc non Af Amer: >60 mL/min (ref >60)
Potassium: 3.8 mEq/L (ref 3.5–5.1)
Sodium: 139 mEq/L (ref 135–145)

## 2010-04-23 LAB — TSH: TSH: 1.308 u[IU]/mL (ref 0.350–4.500)

## 2010-04-28 ENCOUNTER — Encounter: Payer: Self-pay | Admitting: Family Medicine

## 2010-04-28 ENCOUNTER — Encounter: Payer: Self-pay | Admitting: Internal Medicine

## 2010-04-28 ENCOUNTER — Ambulatory Visit (INDEPENDENT_AMBULATORY_CARE_PROVIDER_SITE_OTHER): Payer: BC Managed Care – PPO | Admitting: Family Medicine

## 2010-04-28 ENCOUNTER — Other Ambulatory Visit: Payer: Self-pay | Admitting: Family Medicine

## 2010-04-28 VITALS — BP 136/80 | HR 80 | Temp 99.1°F | Wt 240.0 lb

## 2010-04-28 DIAGNOSIS — R7309 Other abnormal glucose: Secondary | ICD-10-CM

## 2010-04-28 DIAGNOSIS — A084 Viral intestinal infection, unspecified: Secondary | ICD-10-CM

## 2010-04-28 DIAGNOSIS — Z13220 Encounter for screening for lipoid disorders: Secondary | ICD-10-CM

## 2010-04-28 DIAGNOSIS — A088 Other specified intestinal infections: Secondary | ICD-10-CM

## 2010-04-28 DIAGNOSIS — R739 Hyperglycemia, unspecified: Secondary | ICD-10-CM | POA: Insufficient documentation

## 2010-04-28 LAB — COMPREHENSIVE METABOLIC PANEL
AST: 28 U/L (ref 0–37)
Albumin: 4.2 g/dL (ref 3.5–5.2)
Alkaline Phosphatase: 129 U/L — ABNORMAL HIGH (ref 39–117)
BUN: 8 mg/dL (ref 6–23)
Calcium: 9.1 mg/dL (ref 8.4–10.5)
Chloride: 100 mEq/L (ref 96–112)
Potassium: 3.8 mEq/L (ref 3.5–5.3)
Sodium: 138 mEq/L (ref 135–145)
Total Protein: 7.7 g/dL (ref 6.0–8.3)

## 2010-04-28 LAB — CONVERTED CEMR LAB
Cholesterol: 193 mg/dL (ref 0–200)
LDL Cholesterol: 103 mg/dL — ABNORMAL HIGH (ref 0–99)
Triglycerides: 309 mg/dL — ABNORMAL HIGH (ref <150)
VLDL: 62 mg/dL — ABNORMAL HIGH (ref 0–40)

## 2010-04-28 NOTE — Progress Notes (Signed)
  Subjective:    Patient ID: [name], female    DOB: 1970-04-27, 40 y.o.   MRN: [id number]  HPI 40 year old female presents with 1 week of dizziness, nausea, vomiting, diarrhea... Now resolved in last 3 days. Still nausea. Drinking liquids.. Always feels thirsty. Urinates a lot on vesicare but worse than in past. Fatigue in last  week. Checked blood sugar last night.   No personal history of DM last labs had prediabetes (fasting CBG), but mother with DM.  Last night 30 min after eating 245. Had fasting this AM 216 at 10:30 AM.  POC glucose here today 114, still fasting.    Review of Systems  Constitutional: Positive for fatigue. Negative for fever and appetite change.  HENT: Negative for congestion and neck pain.   Respiratory: Negative for cough and shortness of breath.   Cardiovascular: Negative for chest pain.  Gastrointestinal: Negative for nausea, abdominal pain, diarrhea and abdominal distention.  Genitourinary: Positive for urgency and frequency. Negative for dysuria.  Musculoskeletal: Negative for arthralgias.       Objective:   Physical Exam  Constitutional: Vital signs are normal. She appears well-developed and well-nourished. She is cooperative.  Non-toxic appearance. She does not appear ill. No distress.       Morbidly obese femalel in NAD  HENT:  Head: Normocephalic.  Right Ear: Hearing, tympanic membrane, external ear and ear canal normal.  Left Ear: Hearing, tympanic membrane, external ear and ear canal normal.  Nose: Mucosal edema and rhinorrhea present. Right sinus exhibits no maxillary sinus tenderness and no frontal sinus tenderness. Left sinus exhibits no maxillary sinus tenderness and no frontal sinus tenderness.  Mouth/Throat: Uvula is midline, oropharynx is clear and moist and mucous membranes are normal.  Eyes: Conjunctivae, EOM and lids are normal. Pupils are equal, round, and reactive to light. No foreign bodies found.  Neck: Trachea normal  and normal range of motion. Neck supple. Carotid bruit is not present. No mass and no thyromegaly present.  Cardiovascular: Normal rate, regular rhythm, S1 normal, S2 normal, normal heart sounds, intact distal pulses and normal pulses.  Exam reveals no gallop and no friction rub.   No murmur heard. Pulmonary/Chest: Effort normal and breath sounds normal. Not tachypneic. No respiratory distress. She has no decreased breath sounds. She has no wheezes. She has no rhonchi. She has no rales.  Abdominal: Soft. Normal appearance and bowel sounds are normal. She exhibits no distension, no fluid wave, no abdominal bruit and no mass. There is no hepatosplenomegaly. There is no tenderness. There is no rebound, no guarding and no CVA tenderness. No hernia.  Genitourinary: Vagina normal and uterus normal.  Lymphadenopathy:    She has no cervical adenopathy.  Neurological: She is alert. She has normal strength.  Skin: Skin is warm, dry and intact. No rash noted.  Psychiatric: Her mood appears anxious. She does not exhibit a depressed mood.          Assessment & Plan:

## 2010-04-28 NOTE — Assessment & Plan Note (Signed)
Resolved

## 2010-04-28 NOTE — Patient Instructions (Signed)
We will call you with labs if anything needs to be done over the weekend. Push fluids.... Water. Avoid carbohydrates, eat a well balanced high protein high fiber healthy diet.

## 2010-04-28 NOTE — Assessment & Plan Note (Signed)
Probable new diagnosis of diabetes. Will send for stat electrolyte eval to rule out HONK..the patient's glucose in clinic is normal but she has not eaten in 3-4 hours.  Will get A1C and lipids...if A1C elevated will start on metfomin daily.  Healthy low carb diet... Push fluids.  Follow up with primary MD next week.

## 2010-04-28 NOTE — Progress Notes (Signed)
  Subjective:    Patient ID: [name], female    DOB: 1970/01/31, 40 y.o.   MRN: [id number]  HPI    Review of Systems     Objective:   Physical Exam        Assessment & Plan:  Note: Labs returned 3/31 PM... CMET nml, A1C 5.8, LDL 103, tri 309, HDL 28.  Pt called.. Results discussed. Instructed pt to work on low carb diet... Still dx of prediabetes not DM. Decrease fatty fried foods, start fish oil daily...follow up with PCP.

## 2010-04-30 ENCOUNTER — Telehealth: Payer: Self-pay

## 2010-04-30 NOTE — Telephone Encounter (Signed)
[facility] Triage Call Report Triage Record Num: [id number] Operator: Ether Griffins Patient Name: [name] Call Date & Time: 04/28/2010 10:34:50AM Patient Phone: [phone] PCP: Sonda Primes Patient Gender: Female PCP Fax : [phone] Patient DOB: 26-Nov-1970 [facility] Name: [facility] Reason for Call: Calling about feeling nauseous and dizzy, BS 229 @ 0800 & 216 @ 1000.Onset 04/28/10.Haven't eaten.Onset 04/25/10 of nausea and dizziness.Contacted office and advised to send pt right away.Pt advised to got ot office ASAP. Protocol(s) Used: Diabetes: Control Problems Recommended Outcome per Protocol: See Provider within 4 hours Reason for Outcome: New or increasing symptoms OR glucose not within provider defined guidelines AND not taking medications/following treatment plan Care Advice: ~ Another adult should drive. Follow the usual meal plan if possible. Drink extra non-caloric fluids. If unable to eat at all, drink regular soft drinks and juices so that 50 grams of carbohydrates are taken in every 3 to 4 hours. ~ ~ Call provider if symptoms worsen before scheduled appointment. Go to ED immediately if blood sugar is 300 mg/dl or more AND symptoms are present including: large urine ketones, rapid, deep breathing, fruity breath, nausea, vomiting or abdominal pain; confused thinking, dry, flushed skin; extreme thirst, extreme fatigue or weakness; or frequent urination. ~ ~ SYMPTOM / CONDITION MANAGEMENT ~ CAUTIONS ~ List, or take, all current prescription(s), nonprescription or alternative medication(s) to provider for evaluation. ~ Check blood sugar and ketones before calling provider ~ Call EMS 911 if any loss of consciousness, difficult to awaken, slow to respond, or new onset of confused thinking. Medication Advice: - Discontinue all nonprescription and alternative medications, especially stimulants, until evaluated by provider. - Take prescribed medications as directed,  following label instructions for the medication. - Do not change medications or dosing regimen until provider is consulted. - Know possible side effects of medication and what to do if they occur. - Tell provider all prescription, nonprescription or alternative medications that you take ~ Blood Sugar and Ketone Testing: - Check blood sugar as recommended, and more often whenever it has been high or low, any change to the treatment plan, increased stress, illness, infection, or a change to the regular schedule. - Test for urine ketones anytime the blood sugar is above 250 mg/dl, when there is stress, acute illness, nausea, vomiting, or abdominal pain. - Follow action plan for illness. - Record blood sugar and ketones in meter log or separate logbook, and take to all provider visits. - Drink extra water and other non-sugar fluids to prevent dehydration when the blood sugar is high and urine ketones are present. ~ High Blood Sugar Treatment: - Follow action plan. - Adjust medications if instructed to do so in action plan or by provider. - Test blood sugar and ketones more often. - Record blood sugar and ketones in meter log or separate logbook, and take to all provider visits. - Drink extra water and other non-sugar fluids to prevent dehydration when the blood sugar is high and urine ketones ~ 04/28/2010 10:50:30AM Page 1 of 2 [id number] [facility] Triage Call Report Patient Name: [name] continuation page/s are present. 03/

## 2010-05-07 ENCOUNTER — Telehealth: Payer: Self-pay | Admitting: *Deleted

## 2010-05-07 NOTE — Telephone Encounter (Signed)
Patient requesting to try metformin - She knows she is pre-diabetic and has read that metformin helps with weight loss. Please advise.

## 2010-05-08 NOTE — Telephone Encounter (Signed)
Patient informed. 

## 2010-05-08 NOTE — Telephone Encounter (Signed)
Needs OV.  

## 2010-05-17 ENCOUNTER — Telehealth: Payer: Self-pay | Admitting: *Deleted

## 2010-05-17 DIAGNOSIS — Z0000 Encounter for general adult medical examination without abnormal findings: Secondary | ICD-10-CM

## 2010-05-17 NOTE — Telephone Encounter (Signed)
Pt states she was told by MD to follow up regarding lab results. First open apt is in June. She is req results of labs or f/u ov sooner. Please advise.

## 2010-05-22 NOTE — Telephone Encounter (Signed)
OK CBC, TSH, CMET, UA, lipids Dx v70.0 OV on Thur next wk - I will unblock my schedule on Thu and Fri next wk (May 3 and 4) Thx!

## 2010-05-22 NOTE — Telephone Encounter (Signed)
Lab order entered, please set up pt for apt as below. THANKS

## 2010-05-23 NOTE — Telephone Encounter (Signed)
Pt has appt 5/3 w/Dr Plotnikov, pt advised of labs & appt.

## 2010-05-24 ENCOUNTER — Telehealth: Payer: Self-pay | Admitting: Internal Medicine

## 2010-05-24 ENCOUNTER — Other Ambulatory Visit (INDEPENDENT_AMBULATORY_CARE_PROVIDER_SITE_OTHER): Payer: BC Managed Care – PPO | Admitting: Internal Medicine

## 2010-05-24 ENCOUNTER — Other Ambulatory Visit (INDEPENDENT_AMBULATORY_CARE_PROVIDER_SITE_OTHER): Payer: BC Managed Care – PPO

## 2010-05-24 DIAGNOSIS — Z0000 Encounter for general adult medical examination without abnormal findings: Secondary | ICD-10-CM

## 2010-05-24 DIAGNOSIS — Z13220 Encounter for screening for lipoid disorders: Secondary | ICD-10-CM

## 2010-05-24 LAB — COMPREHENSIVE METABOLIC PANEL
AST: 24 U/L (ref 0–37)
Alkaline Phosphatase: 143 U/L — ABNORMAL HIGH (ref 39–117)
BUN: 15 mg/dL (ref 6–23)
Creatinine, Ser: 0.7 mg/dL (ref 0.4–1.2)
Glucose, Bld: 100 mg/dL — ABNORMAL HIGH (ref 70–99)
Potassium: 3.9 mEq/L (ref 3.5–5.1)
Total Bilirubin: 0.6 mg/dL (ref 0.3–1.2)

## 2010-05-24 LAB — LIPID PANEL
Cholesterol: 192 mg/dL (ref 0–200)
Total CHOL/HDL Ratio: 6

## 2010-05-24 LAB — CBC WITH DIFFERENTIAL/PLATELET
Basophils Relative: 0.5 % (ref 0.0–3.0)
Eosinophils Absolute: 0.2 10*3/uL (ref 0.0–0.7)
Eosinophils Relative: 2.4 % (ref 0.0–5.0)
HCT: 44.5 % (ref 36.0–46.0)
Hemoglobin: 15.7 g/dL — ABNORMAL HIGH (ref 12.0–15.0)
Lymphs Abs: 2 10*3/uL (ref 0.7–4.0)
MCHC: 35.3 g/dL (ref 30.0–36.0)
MCV: 93.2 fl (ref 78.0–100.0)
Monocytes Absolute: 0.8 10*3/uL (ref 0.1–1.0)
Neutro Abs: 5.4 10*3/uL (ref 1.4–7.7)
Neutrophils Relative %: 63.5 % (ref 43.0–77.0)
RBC: 4.78 Mil/uL (ref 3.87–5.11)
WBC: 8.5 10*3/uL (ref 4.5–10.5)

## 2010-05-24 LAB — URINALYSIS, ROUTINE W REFLEX MICROSCOPIC
Bilirubin Urine: NEGATIVE
Nitrite: NEGATIVE
Total Protein, Urine: NEGATIVE
Urine Glucose: NEGATIVE
pH: 5.5 (ref 5.0–8.0)

## 2010-05-24 LAB — LDL CHOLESTEROL, DIRECT: Direct LDL: 111.6 mg/dL

## 2010-05-24 MED ORDER — CIPROFLOXACIN HCL 250 MG PO TABS: 250.0000 mg | ORAL_TABLET | Freq: Two times a day (BID) | ORAL | Status: AC

## 2010-05-24 NOTE — Telephone Encounter (Signed)
Message copied by Sonda Primes on Thu May 24, 2010 11:02 PM ------      Message from: Lamar Sprinkles      Created: Thu May 24, 2010  7:16 PM       Labs ready, pt has OV next week      ----- Message -----         From: Lab In [place] [employer] Interface         Sent: 05/24/2010  12:58 PM           To: Lamar Sprinkles, CMA

## 2010-05-24 NOTE — Telephone Encounter (Signed)
Misty Stanley, please, inform patient that she has a UTI. Take cipro. Keep ROV Thx

## 2010-05-25 NOTE — Telephone Encounter (Signed)
Pt informed

## 2010-05-31 ENCOUNTER — Encounter: Payer: Self-pay | Admitting: Internal Medicine

## 2010-05-31 ENCOUNTER — Ambulatory Visit (INDEPENDENT_AMBULATORY_CARE_PROVIDER_SITE_OTHER): Payer: BC Managed Care – PPO | Admitting: Internal Medicine

## 2010-05-31 DIAGNOSIS — F988 Other specified behavioral and emotional disorders with onset usually occurring in childhood and adolescence: Secondary | ICD-10-CM

## 2010-05-31 DIAGNOSIS — R7309 Other abnormal glucose: Secondary | ICD-10-CM

## 2010-05-31 DIAGNOSIS — N3090 Cystitis, unspecified without hematuria: Secondary | ICD-10-CM | POA: Insufficient documentation

## 2010-05-31 DIAGNOSIS — L259 Unspecified contact dermatitis, unspecified cause: Secondary | ICD-10-CM

## 2010-05-31 DIAGNOSIS — R739 Hyperglycemia, unspecified: Secondary | ICD-10-CM

## 2010-05-31 DIAGNOSIS — F17200 Nicotine dependence, unspecified, uncomplicated: Secondary | ICD-10-CM

## 2010-05-31 DIAGNOSIS — L309 Dermatitis, unspecified: Secondary | ICD-10-CM | POA: Insufficient documentation

## 2010-05-31 DIAGNOSIS — F3289 Other specified depressive episodes: Secondary | ICD-10-CM

## 2010-05-31 DIAGNOSIS — R059 Cough, unspecified: Secondary | ICD-10-CM

## 2010-05-31 DIAGNOSIS — F329 Major depressive disorder, single episode, unspecified: Secondary | ICD-10-CM

## 2010-05-31 DIAGNOSIS — R05 Cough: Secondary | ICD-10-CM

## 2010-05-31 MED ORDER — HYDROCOD POLST-CHLORPHEN POLST 10-8 MG/5ML PO LQCR: 5.0000 mL | Freq: Two times a day (BID) | ORAL | Status: DC | PRN

## 2010-05-31 MED ORDER — ORLISTAT 120 MG PO CAPS: 120.0000 mg | ORAL_CAPSULE | Freq: Three times a day (TID) | ORAL | Status: DC

## 2010-05-31 MED ORDER — TRIAMCINOLONE ACETONIDE 0.1 % EX OINT: 1.0000 "application " | TOPICAL_OINTMENT | Freq: Two times a day (BID) | CUTANEOUS | Status: DC

## 2010-05-31 MED ORDER — DEXMETHYLPHENIDATE HCL ER 15 MG PO CP24: 15.0000 mg | ORAL_CAPSULE | Freq: Every day | ORAL | Status: DC

## 2010-05-31 NOTE — Assessment & Plan Note (Signed)
On Triamcinolone

## 2010-05-31 NOTE — Progress Notes (Signed)
  Subjective:    Patient ID: [name], female    DOB: 1970-11-14, 40 y.o.   MRN: [id number]  HPI  The patient presents for a follow-up of  chronic depression, chronic dyslipidemia, type 2 prediabetes controlled without medicines. C/o wt gain, cough. Smoking 1 ppd.    Review of Systems  Constitutional: Negative for chills and appetite change.  HENT: Positive for facial swelling. Negative for postnasal drip and tinnitus.   Respiratory: Positive for cough. Negative for shortness of breath.   Musculoskeletal: Negative for back pain and arthralgias.  Neurological: Negative for syncope.  Psychiatric/Behavioral: Negative for suicidal ideas, confusion and dysphoric mood. The patient is nervous/anxious.        Objective:   Physical Exam  Vitals reviewed. Constitutional: She appears well-developed and well-nourished. No distress.       obese  HENT:  Head: Normocephalic.  Right Ear: External ear normal.  Left Ear: External ear normal.  Nose: Nose normal.  Mouth/Throat: Oropharynx is clear and moist.  Eyes: Conjunctivae are normal. Pupils are equal, round, and reactive to light. Right eye exhibits no discharge. Left eye exhibits no discharge.  Neck: Normal range of motion. Neck supple. No JVD present. No tracheal deviation present. No thyromegaly present.  Cardiovascular: Normal rate, regular rhythm and normal heart sounds.   Pulmonary/Chest: No stridor. No respiratory distress. She has no wheezes.  Abdominal: Soft. Bowel sounds are normal. She exhibits no distension and no mass. There is no tenderness. There is no rebound and no guarding.  Musculoskeletal: She exhibits no edema and no tenderness.  Lymphadenopathy:    She has no cervical adenopathy.  Neurological: She displays normal reflexes. No cranial nerve deficit. She exhibits normal muscle tone. Coordination normal.       restless  Skin: No rash noted. No erythema.  Psychiatric: Her behavior is normal. Judgment and thought  content normal.       anxios       Lab Results  Component Value Date   WBC 8.5 05/24/2010   HGB 15.7* 05/24/2010   HCT 44.5 05/24/2010   PLT 214.0 05/24/2010   CHOL 192 05/24/2010   TRIG 239.0* 05/24/2010   HDL 31.60* 05/24/2010   LDLDIRECT 111.6 05/24/2010   ALT 28 05/24/2010   AST 24 05/24/2010   NA 138 05/24/2010   K 3.9 05/24/2010   CL 98 05/24/2010   CREATININE 0.7 05/24/2010   BUN 15 05/24/2010   CO2 28 05/24/2010   TSH 2.72 05/24/2010   HGBA1C 5.8* 04/28/2010   Wt Readings from Last 3 Encounters:  05/31/10 247 lb (112.038 kg)  04/28/10 240 lb (108.863 kg)  02/27/10 234 lb (106.142 kg)     Assessment & Plan:  Hyperglycemia Needs wt loss  SMOKER She needs to d/c smoking (1ppd)   DEPRESSION Per psychiatry Rx reviewed  ADD On Focalin xr  Eczema On Triamcinolone  COUGH Chronic. Tussionex given. I will not rx Tussionex again, she needs to quit smoking    Wt gain  Try Xenical  Complex case

## 2010-05-31 NOTE — Assessment & Plan Note (Signed)
Needs wt loss

## 2010-05-31 NOTE — Assessment & Plan Note (Signed)
On Focalin xr

## 2010-05-31 NOTE — Patient Instructions (Signed)
Please stop smoking 

## 2010-05-31 NOTE — Assessment & Plan Note (Signed)
She needs to d/c smoking (1ppd)

## 2010-05-31 NOTE — Assessment & Plan Note (Signed)
Per psychiatry Rx reviewed

## 2010-05-31 NOTE — Assessment & Plan Note (Signed)
Chronic. Tussionex given. I will not rx Tussionex again, she needs to quit smoking

## 2010-06-15 NOTE — H&P (Signed)
[place]. [employer]  Patient:    [name], [name]                      MRN: [id number] Adm. Date:  [id number] Attending:  Waldon Merl CC:         Doris Cheadle Lyman Bishop, M.D.  Corwin Levins, M.D. [facility]   History and Physical  This patient is a 40 year old female who had severe tonsillitis in the past; underwent a tonsillectomy on May 01, 2000 and did very well under Dr. Dalene Carrow care.  She, on Sunday, had a small amount of bleeding and was seen in the emergency room and was told that she should just follow-up with Dr. Lyman Bishop where she was planning to do this.  However, today she had further bleeding and she was told before that it was the right tonsil that was giving her a problem.  She had had some food to eat today, apparently a biscuit and a milkshake at noon, but had nothing other than that today.  She had started having some bleeding this afternoon and came into the emergency room where she showed evidence of approximately 500-700 cc of blood that she had lost from her tonsillar bed.  With this bleeding and with the continuing bleeding, I felt it best to control the bleeding under general endotracheal anesthesia.  MEDICATIONS: 1. Xanax p.r.n. 2. Restoril 30 mg at night for sleep p.r.n.  SOCIAL HISTORY:  She smokes one pack a day.  PAST SURGICAL HISTORY:  She had a tubal ligation in 1995.  PAST MEDICAL HISTORY:  Her only other complaint is that of her multiple tonsillitis problems.  PHYSICAL EXAMINATION  VITAL SIGNS:  Blood pressure 128/80, 107/63, pulse came in at 154, but now down to 111, respirations 18 and 20.  HEENT:  Ears are clear.  Oral cavity shows a considerable amount of blood in the right side to be the more of concern.  NECK:  Free of any thyromegaly, cervical adenopathy, or mass.  CHEST:  Clear to rales, rhonchi, or wheezes.  CARDIOVASCULAR:  No ______, murmurs, or gallops.  ABDOMEN:  Free of any organomegaly,  tenderness, or mass.  EXTREMITIES:  Unremarkable.  INITIAL DIAGNOSES:  Right tonsillar bleed postoperative six days. DD:  05/07/00 TD:  05/07/00 Job: [id number]

## 2010-06-15 NOTE — Op Note (Signed)
[place]. [facility]  Patient:    [name], [name]                      MRN: [id number] Proc. Date: 05/01/00 Adm. Date:  [id number] Attending:  Claudina Lick                           Operative Report  PREOPERATIVE DIAGNOSIS:  Hypertrophic chronic adenotonsillitis.  POSTOPERATIVE DIAGNOSIS:  Hypertrophic chronic adenotonsillitis.  OPERATION:  Tonsillectomy and adenoidectomy.  SURGEON:  Robert L. Lyman Bishop, M.D.  ANESTHESIA:  General  DESCRIPTION OF PROCEDURE:  This patient has had a long standing history of chronic recurring adenotonsillitis.  Examination showed 3+ enlarged very cryptic tonsils full of inspissated debride.  A moderately enlarged chronically diseased appearing adenoids.  The patient admitted for surgery.  After satisfactory general endotracheal anesthesia had been induced, a Jennings mouth gag was inserted and retracting on the soft palate, moderately enlarged adenoids were removed with a curet and a punch forceps.  Bleeding controlled with light cautery and a pack.  Following this, a Crowe-Davis mouth gag was inserted suspended from the [employer] stand and the 3+ enlarged very cryptic tonsils were excised by way of electrodissection with coagulation of the vessels.  Estimated blood loss was 50 cc.  The patient was given 12 mg of Decadron 1 gram of Ancef IV intraoperatively.  The patient tolerated the procedure well and was awakened from anesthesia and taken to the recovery room in satisfactory condition. DD:  05/01/00 TD:  05/01/00 Job: [id number]

## 2010-06-15 NOTE — Op Note (Signed)
[place]. [facility]  Patient:    [name], [name]                      MRN: [id number] Adm. Date:  [id number] Attending:  Waldon Merl CC:         Doris Cheadle Lyman Bishop, M.D.  Corwin Levins, M.D. [facility]   Operative Report  PREOPERATIVE DIAGNOSIS:  Right tonsillar bleed, six days postop.  POSTOPERATIVE DIAGNOSIS:  Right tonsillar bleed, six days postop.  OPERATION:  Evaluation of tonsillar bleed under general endotracheal anesthesia and electrocauterization of area of bleeding, inferior pole, right tonsillar bed.  ANESTHESIOLOGIST:  Bedelia Person, M.D.  ANESTHESIA:  General endotracheal.  SURGEON:  Keturah Barre, M.D.  DESCRIPTION OF PROCEDURE:  Patient was placed in supine position and under general endotracheal anesthesia, once the patient was intubated and we had a time of relief from the excessive bleeding that we were seeing before, she was intubated without difficulty.  There was no vomiting.  There was no airway problem.  Once she was intubated, then the tonsillar region was evaluated, the tonsillar gag was placed and the left side appeared to be in excellent condition with no evidence of any clot or bleeding.  On the right side, she had an area of clot on the tonsillar bed.  This was removed and then the tonsillar bleeding area, which was in the inferior pole, was cauterized.  Any small bleeders or any small areas of suspicious bleeders were cauterized in the entire tonsillar bed.  The base of the tongue and tonsillar bed were carefully evaluated and these were electrocoagulated using the Bovie.  Once this was completed, the nasopharynx was suctioned and also the stomach was suctioned of some blood material and the the patient was awakened, tolerated the procedure well and is doing well postoperatively.  Her followup will be in three days, then in one week and two weeks and three weeks.  She will be kept overnight for observation. DD:   05/07/00 TD:  05/08/00 Job: [id number]

## 2010-06-26 ENCOUNTER — Other Ambulatory Visit: Payer: Self-pay | Admitting: Internal Medicine

## 2010-08-06 ENCOUNTER — Telehealth: Payer: Self-pay | Admitting: *Deleted

## 2010-08-06 MED ORDER — METOPROLOL TARTRATE 50 MG PO TABS: 50.0000 mg | ORAL_TABLET | Freq: Two times a day (BID) | ORAL | Status: DC

## 2010-08-06 NOTE — Telephone Encounter (Signed)
Patient requesting to change from Toprol XL to Lopressor - she has lost her insurance and needs cheaper med.

## 2010-08-06 NOTE — Telephone Encounter (Signed)
OK See Meds Thx

## 2010-08-07 NOTE — Telephone Encounter (Signed)
Pt advised via VM of Rx/pharmacy

## 2010-10-03 ENCOUNTER — Telehealth: Payer: Self-pay | Admitting: *Deleted

## 2010-10-03 NOTE — Telephone Encounter (Signed)
[facility] called regarding medical records and questionnaire for pt's SSI/disability that was sent last week. They received records release but need questionnaire filled out by MD. If MD doesn't wish to fill out questionnaire, write N/A in all appropriate places and fax questionnaire back to [phone]

## 2010-10-03 NOTE — Telephone Encounter (Signed)
Forms placed in Dr. Loren Racer red folder to be completed on Thur.

## 2010-10-09 NOTE — Telephone Encounter (Signed)
Renee in [facility] called the pt and left detailed mess informing of below.

## 2010-10-09 NOTE — Telephone Encounter (Signed)
I spoke w/Latoya. Psychiatrist would be more appropriate to fill these forms. If I have to - she needs OV (not seen here x 4 mo) Thx

## 2010-10-11 ENCOUNTER — Telehealth: Payer: Self-pay | Admitting: *Deleted

## 2010-10-11 NOTE — Telephone Encounter (Signed)
I called pt to schedule OV so that Dr. Nolon Lennert can complete [facility] forms. Pt understands and I transferred her to scheduler. Forms to be completed are at my desk.

## 2010-10-16 ENCOUNTER — Encounter: Payer: Self-pay | Admitting: Internal Medicine

## 2010-10-16 ENCOUNTER — Ambulatory Visit (INDEPENDENT_AMBULATORY_CARE_PROVIDER_SITE_OTHER): Payer: Self-pay | Admitting: Internal Medicine

## 2010-10-16 DIAGNOSIS — J45909 Unspecified asthma, uncomplicated: Secondary | ICD-10-CM

## 2010-10-16 DIAGNOSIS — F32A Depression, unspecified: Secondary | ICD-10-CM

## 2010-10-16 DIAGNOSIS — R5383 Other fatigue: Secondary | ICD-10-CM

## 2010-10-16 DIAGNOSIS — R5381 Other malaise: Secondary | ICD-10-CM

## 2010-10-16 DIAGNOSIS — F411 Generalized anxiety disorder: Secondary | ICD-10-CM

## 2010-10-16 DIAGNOSIS — F3289 Other specified depressive episodes: Secondary | ICD-10-CM

## 2010-10-16 DIAGNOSIS — F329 Major depressive disorder, single episode, unspecified: Secondary | ICD-10-CM

## 2010-10-16 MED ORDER — FLUCONAZOLE 150 MG PO TABS: 150.0000 mg | ORAL_TABLET | Freq: Once | ORAL | Status: DC

## 2010-10-16 MED ORDER — AZITHROMYCIN 250 MG PO TABS: ORAL_TABLET | ORAL | Status: DC

## 2010-10-16 MED ORDER — PROMETHAZINE-CODEINE 6.25-10 MG/5ML PO SYRP: 5.0000 mL | ORAL_SOLUTION | ORAL | Status: AC | PRN

## 2010-10-16 MED ORDER — PREDNISONE 10 MG PO TABS: ORAL_TABLET | ORAL | Status: AC

## 2010-10-16 MED ORDER — OMEPRAZOLE 20 MG PO CPDR: 20.0000 mg | DELAYED_RELEASE_CAPSULE | Freq: Every day | ORAL | Status: DC

## 2010-10-16 MED ORDER — AZITHROMYCIN 250 MG PO TABS: ORAL_TABLET | ORAL | Status: AC

## 2010-10-17 ENCOUNTER — Ambulatory Visit: Payer: BC Managed Care – PPO | Admitting: Internal Medicine

## 2010-10-22 DIAGNOSIS — F32A Depression, unspecified: Secondary | ICD-10-CM | POA: Insufficient documentation

## 2010-10-22 DIAGNOSIS — F329 Major depressive disorder, single episode, unspecified: Secondary | ICD-10-CM | POA: Insufficient documentation

## 2010-10-22 NOTE — Progress Notes (Signed)
  Subjective:    Patient ID: [name], female    DOB: April 25, 1970, 40 y.o.   MRN: [id number]  HPI   The patient is here to follow up on chronic depression, anxiety, headaches and chronic moderate fibromyalgia symptoms partially controlled with medicines, diet and exercise. F/u HTN, asthma.   Review of Systems  Constitutional: Positive for fatigue. Negative for chills, activity change, appetite change and unexpected weight change.  HENT: Negative for congestion, mouth sores and sinus pressure.   Eyes: Negative for visual disturbance.  Respiratory: Positive for cough. Negative for chest tightness.   Gastrointestinal: Negative for nausea and abdominal pain.  Genitourinary: Negative for frequency, difficulty urinating and vaginal pain.  Musculoskeletal: Negative for back pain and gait problem.  Skin: Negative for pallor and rash.  Neurological: Negative for dizziness, tremors, weakness, numbness and headaches.  Psychiatric/Behavioral: Positive for behavioral problems, sleep disturbance and dysphoric mood. Negative for suicidal ideas and confusion. The patient is nervous/anxious.        Objective:   Physical Exam  Constitutional: She appears well-developed. No distress.       obese  HENT:  Head: Normocephalic.  Right Ear: External ear normal.  Left Ear: External ear normal.  Nose: Nose normal.  Mouth/Throat: Oropharynx is clear and moist.  Eyes: Conjunctivae are normal. Pupils are equal, round, and reactive to light. Right eye exhibits no discharge. Left eye exhibits no discharge.  Neck: Normal range of motion. Neck supple. No JVD present. No tracheal deviation present. No thyromegaly present.  Cardiovascular: Normal rate, regular rhythm and normal heart sounds.   Pulmonary/Chest: No stridor. No respiratory distress. She has no wheezes.  Abdominal: Soft. Bowel sounds are normal. She exhibits no distension and no mass. There is no tenderness. There is no rebound and no guarding.    Musculoskeletal: She exhibits no edema and no tenderness.  Lymphadenopathy:    She has no cervical adenopathy.  Neurological: She displays tremor. She displays normal reflexes. No cranial nerve deficit. She exhibits normal muscle tone. Coordination normal.  Skin: No rash noted. No erythema.  Psychiatric: Her behavior is normal. Judgment and thought content normal. Her mood appears anxious. Thought content is not paranoid. She exhibits a depressed mood. She expresses no homicidal and no suicidal ideation.          Assessment & Plan:

## 2010-10-22 NOTE — Assessment & Plan Note (Signed)
Continue with current prescription therapy as reflected on the Med list.  

## 2010-12-06 ENCOUNTER — Emergency Department: Payer: Self-pay | Admitting: *Deleted

## 2011-02-09 ENCOUNTER — Other Ambulatory Visit: Payer: Self-pay | Admitting: Internal Medicine

## 2011-02-11 ENCOUNTER — Telehealth: Payer: Self-pay | Admitting: *Deleted

## 2011-02-11 NOTE — Telephone Encounter (Signed)
Requested Medications     predniSONE (DELTASONE) 10 MG tablet [Pharmacy Med Name: PREDNISONE 10 MG TAB QUAL]   TAKE 4 TABS DAILY X3DAYS, 3 TABS DAILY X4DAYS, 2 TABS DAILY X4DAYS, 1TAB DAILY X6DAYS, STOP (AFTER MEALS)   Disp: 38 tablet R: 0 Start: 02/09/2011  Class: Normal   Requested on: 10/16/2010   Last refill: 01/24/2011

## 2011-02-12 NOTE — Telephone Encounter (Signed)
OV w/any MD if sick Thx

## 2011-02-12 NOTE — Telephone Encounter (Signed)
Spoke to pt- she states she doesn't need this med Rf.

## 2011-03-04 ENCOUNTER — Telehealth: Payer: Self-pay | Admitting: *Deleted

## 2011-03-04 NOTE — Telephone Encounter (Signed)
OV w/any MD if sick THx

## 2011-03-04 NOTE — Telephone Encounter (Signed)
Rf req for Prednisone 10 mg 4 tabs X 3 days, 3 tabs x 4 days, 2 tab X 4 days, 1 tab X 6 days, stop(after meals). Last filled 12.27.12 Ok to Rf?

## 2011-03-04 NOTE — Telephone Encounter (Signed)
Spoke to pt again- she states she doesn't need this Rx. Pharmacy informed.

## 2011-07-24 ENCOUNTER — Telehealth: Payer: Self-pay | Admitting: Internal Medicine

## 2011-07-24 DIAGNOSIS — T887XXA Unspecified adverse effect of drug or medicament, initial encounter: Secondary | ICD-10-CM

## 2011-07-24 DIAGNOSIS — I10 Essential (primary) hypertension: Secondary | ICD-10-CM

## 2011-07-24 DIAGNOSIS — F329 Major depressive disorder, single episode, unspecified: Secondary | ICD-10-CM

## 2011-07-24 DIAGNOSIS — R5381 Other malaise: Secondary | ICD-10-CM

## 2011-07-24 DIAGNOSIS — R5383 Other fatigue: Secondary | ICD-10-CM

## 2011-07-24 DIAGNOSIS — F32A Depression, unspecified: Secondary | ICD-10-CM

## 2011-07-24 DIAGNOSIS — F419 Anxiety disorder, unspecified: Secondary | ICD-10-CM

## 2011-07-24 NOTE — Telephone Encounter (Signed)
Please advise on Dx codes?

## 2011-07-24 NOTE — Telephone Encounter (Signed)
Ok Thx 

## 2011-07-24 NOTE — Telephone Encounter (Signed)
Caller: [place]/Patient; Phone Number: [phone]; Message from caller: Pt calling today 07/24/11 regarding her psychiatrist has ordered some blood work for her (Com Metabolic Panel, Lipid Panel, Thyroid Panel, Lithium, Serum).  Pt would like to know if she can have these labs drawn and [facility] psychiatrist office does not draw labs.  PLEASE CALL PT BACK AT [phone] TO LET HER KNOW IF THIS WILL BE POSSIBLE.

## 2011-07-25 NOTE — Telephone Encounter (Signed)
Depression Anxiety 995.20 Thx

## 2011-07-26 NOTE — Telephone Encounter (Signed)
Orders entered. Left detailed mess on number provided below informing pt.

## 2011-07-29 ENCOUNTER — Other Ambulatory Visit (INDEPENDENT_AMBULATORY_CARE_PROVIDER_SITE_OTHER): Payer: Self-pay

## 2011-07-29 DIAGNOSIS — F32A Depression, unspecified: Secondary | ICD-10-CM

## 2011-07-29 DIAGNOSIS — F419 Anxiety disorder, unspecified: Secondary | ICD-10-CM

## 2011-07-29 DIAGNOSIS — F411 Generalized anxiety disorder: Secondary | ICD-10-CM

## 2011-07-29 DIAGNOSIS — F329 Major depressive disorder, single episode, unspecified: Secondary | ICD-10-CM

## 2011-07-29 DIAGNOSIS — T887XXA Unspecified adverse effect of drug or medicament, initial encounter: Secondary | ICD-10-CM

## 2011-07-29 DIAGNOSIS — I10 Essential (primary) hypertension: Secondary | ICD-10-CM

## 2011-07-29 DIAGNOSIS — R5383 Other fatigue: Secondary | ICD-10-CM

## 2011-07-29 DIAGNOSIS — F3289 Other specified depressive episodes: Secondary | ICD-10-CM

## 2011-07-29 DIAGNOSIS — R5381 Other malaise: Secondary | ICD-10-CM

## 2011-07-29 LAB — LIPID PANEL
HDL: 26.3 mg/dL — ABNORMAL LOW (ref >39.00)
LDL Cholesterol: 102 mg/dL — ABNORMAL HIGH (ref 0–99)
Total CHOL/HDL Ratio: 6
VLDL: 30.2 mg/dL (ref 0.0–40.0)

## 2011-07-29 LAB — COMPREHENSIVE METABOLIC PANEL
Chloride: 105 mEq/L (ref 96–112)
GFR: 94.49 mL/min (ref >60.00)
Potassium: 3.9 mEq/L (ref 3.5–5.1)
Sodium: 139 mEq/L (ref 135–145)
Total Protein: 7.5 g/dL (ref 6.0–8.3)

## 2011-07-30 ENCOUNTER — Telehealth: Payer: Self-pay | Admitting: Internal Medicine

## 2011-07-30 LAB — LITHIUM LEVEL: Lithium Lvl: 0.4 mEq/L — ABNORMAL LOW (ref 0.80–1.40)

## 2011-07-30 NOTE — Telephone Encounter (Signed)
Labs faxed to [phone].

## 2011-07-30 NOTE — Telephone Encounter (Signed)
[name], please, fax labs to her psychiatrist (see tel note) Thx

## 2011-09-20 ENCOUNTER — Encounter (HOSPITAL_COMMUNITY): Payer: Self-pay

## 2011-09-20 ENCOUNTER — Emergency Department (HOSPITAL_COMMUNITY)
Admission: EM | Admit: 2011-09-20 | Discharge: 2011-09-20 | Disposition: A | Discharge status: Home / Self Care | Payer: Self-pay | Attending: Emergency Medicine | Admitting: Emergency Medicine

## 2011-09-20 DIAGNOSIS — F17200 Nicotine dependence, unspecified, uncomplicated: Secondary | ICD-10-CM | POA: Insufficient documentation

## 2011-09-20 DIAGNOSIS — F319 Bipolar disorder, unspecified: Secondary | ICD-10-CM | POA: Insufficient documentation

## 2011-09-20 DIAGNOSIS — Z79899 Other long term (current) drug therapy: Secondary | ICD-10-CM | POA: Insufficient documentation

## 2011-09-20 DIAGNOSIS — R45851 Suicidal ideations: Secondary | ICD-10-CM | POA: Insufficient documentation

## 2011-09-20 DIAGNOSIS — F988 Other specified behavioral and emotional disorders with onset usually occurring in childhood and adolescence: Secondary | ICD-10-CM | POA: Insufficient documentation

## 2011-09-20 DIAGNOSIS — J45909 Unspecified asthma, uncomplicated: Secondary | ICD-10-CM | POA: Insufficient documentation

## 2011-09-20 LAB — CBC WITH DIFFERENTIAL/PLATELET
Basophils Relative: 0 % (ref 0–1)
HCT: 45.2 % (ref 36.0–46.0)
Hemoglobin: 16.1 g/dL — ABNORMAL HIGH (ref 12.0–15.0)
Lymphocytes Relative: 16 % (ref 12–46)
Lymphs Abs: 1.7 10*3/uL (ref 0.7–4.0)
MCHC: 35.6 g/dL (ref 30.0–36.0)
Monocytes Absolute: 0.6 10*3/uL (ref 0.1–1.0)
Monocytes Relative: 5 % (ref 3–12)
Neutro Abs: 8.4 10*3/uL — ABNORMAL HIGH (ref 1.7–7.7)
Neutrophils Relative %: 79 % — ABNORMAL HIGH (ref 43–77)
RBC: 5.01 MIL/uL (ref 3.87–5.11)

## 2011-09-20 LAB — COMPREHENSIVE METABOLIC PANEL
Alkaline Phosphatase: 141 U/L — ABNORMAL HIGH (ref 39–117)
BUN: 9 mg/dL (ref 6–23)
CO2: 21 mEq/L (ref 19–32)
Chloride: 106 mEq/L (ref 96–112)
Creatinine, Ser: 0.52 mg/dL (ref 0.50–1.10)
GFR calc Af Amer: >90 mL/min (ref >90)
GFR calc non Af Amer: >90 mL/min (ref >90)
Glucose, Bld: 122 mg/dL — ABNORMAL HIGH (ref 70–99)
Potassium: 4 mEq/L (ref 3.5–5.1)
Total Bilirubin: 0.2 mg/dL — ABNORMAL LOW (ref 0.3–1.2)

## 2011-09-20 LAB — RAPID URINE DRUG SCREEN, HOSP PERFORMED
Cocaine: NOT DETECTED
Opiates: NOT DETECTED
Tetrahydrocannabinol: NOT DETECTED

## 2011-09-20 MED ORDER — PANTOPRAZOLE SODIUM 40 MG PO TBEC: 40.0000 mg | DELAYED_RELEASE_TABLET | Freq: Every day | ORAL | Status: DC

## 2011-09-20 MED ORDER — LOSARTAN POTASSIUM 50 MG PO TABS
100.0000 mg | ORAL_TABLET | Freq: Every day | ORAL | Status: DC
  Filled 2011-09-20: qty 2

## 2011-09-20 MED ORDER — AMPHETAMINE-DEXTROAMPHETAMINE 20 MG PO TABS: 30.0000 mg | ORAL_TABLET | Freq: Two times a day (BID) | ORAL | Status: DC

## 2011-09-20 MED ORDER — CLONAZEPAM 1 MG PO TABS
2.0000 mg | ORAL_TABLET | Freq: Three times a day (TID) | ORAL | Status: DC | PRN
  Administered 2011-09-20: 2 mg via ORAL
  Filled 2011-09-20: qty 2

## 2011-09-20 MED ORDER — FUROSEMIDE 20 MG PO TABS
20.0000 mg | ORAL_TABLET | Freq: Every day | ORAL | Status: DC
  Filled 2011-09-20: qty 1

## 2011-09-20 MED ORDER — DULOXETINE HCL 60 MG PO CPEP
60.0000 mg | ORAL_CAPSULE | Freq: Two times a day (BID) | ORAL | Status: DC
  Administered 2011-09-20: 60 mg via ORAL
  Filled 2011-09-20: qty 1

## 2011-09-20 MED ORDER — ACETAMINOPHEN 325 MG PO TABS: 650.0000 mg | ORAL_TABLET | ORAL | Status: DC | PRN

## 2011-09-20 MED ORDER — IBUPROFEN 600 MG PO TABS: 600.0000 mg | ORAL_TABLET | Freq: Three times a day (TID) | ORAL | Status: DC | PRN

## 2011-09-20 MED ORDER — DARIFENACIN HYDROBROMIDE ER 7.5 MG PO TB24
7.5000 mg | ORAL_TABLET | Freq: Every day | ORAL | Status: DC
  Filled 2011-09-20: qty 1

## 2011-09-20 MED ORDER — ALBUTEROL SULFATE HFA 108 (90 BASE) MCG/ACT IN AERS: 2.0000 | INHALATION_SPRAY | RESPIRATORY_TRACT | Status: DC | PRN

## 2011-09-20 MED ORDER — ARIPIPRAZOLE 15 MG PO TABS
15.0000 mg | ORAL_TABLET | Freq: Every day | ORAL | Status: DC
Start: 2011-09-20 — End: 2011-09-21
  Administered 2011-09-20: 15 mg via ORAL
  Filled 2011-09-20: qty 1

## 2011-09-20 MED ORDER — NICOTINE 21 MG/24HR TD PT24: 21.0000 mg | MEDICATED_PATCH | Freq: Every day | TRANSDERMAL | Status: DC | PRN

## 2011-09-20 MED ORDER — HYDROCHLOROTHIAZIDE 25 MG PO TABS
25.0000 mg | ORAL_TABLET | Freq: Every day | ORAL | Status: DC
  Filled 2011-09-20: qty 1

## 2011-09-20 MED ORDER — ZOLPIDEM TARTRATE 10 MG PO TABS
10.0000 mg | ORAL_TABLET | Freq: Every evening | ORAL | Status: DC | PRN
  Administered 2011-09-20: 10 mg via ORAL
  Filled 2011-09-20: qty 1

## 2011-09-20 MED ORDER — ONDANSETRON HCL 4 MG PO TABS: 4.0000 mg | ORAL_TABLET | Freq: Three times a day (TID) | ORAL | Status: DC | PRN

## 2011-09-20 MED ORDER — ALBUTEROL SULFATE (5 MG/ML) 0.5% IN NEBU: 2.5000 mg | INHALATION_SOLUTION | RESPIRATORY_TRACT | Status: DC | PRN

## 2011-09-20 MED ORDER — AMPHETAMINE-DEXTROAMPHETAMINE 10 MG PO TABS: 30.0000 mg | ORAL_TABLET | Freq: Two times a day (BID) | ORAL | Status: DC

## 2011-09-20 MED ORDER — SUMATRIPTAN SUCCINATE 100 MG PO TABS
100.0000 mg | ORAL_TABLET | ORAL | Status: DC | PRN
  Filled 2011-09-20: qty 1

## 2011-09-20 MED ORDER — ZOLPIDEM TARTRATE 5 MG PO TABS: 5.0000 mg | ORAL_TABLET | Freq: Every evening | ORAL | Status: DC | PRN

## 2011-09-20 MED ORDER — DOXEPIN HCL 100 MG PO CAPS
100.0000 mg | ORAL_CAPSULE | Freq: Every day | ORAL | Status: DC
  Administered 2011-09-20: 100 mg via ORAL
  Filled 2011-09-20: qty 1

## 2011-09-20 MED ORDER — DICLOFENAC EPOLAMINE 1.3 % TD PTCH
1.0000 | MEDICATED_PATCH | Freq: Two times a day (BID) | TRANSDERMAL | Status: DC
  Administered 2011-09-20: 1 via TRANSDERMAL
  Filled 2011-09-20: qty 1

## 2011-09-20 MED ORDER — DOXEPIN HCL 50 MG PO CAPS
50.0000 mg | ORAL_CAPSULE | Freq: Two times a day (BID) | ORAL | Status: DC
  Administered 2011-09-20: 50 mg via ORAL
  Filled 2011-09-20: qty 1

## 2011-09-20 MED ORDER — LOSARTAN POTASSIUM-HCTZ 100-25 MG PO TABS
1.0000 | ORAL_TABLET | Freq: Every day | ORAL | Status: DC
Start: 2011-09-20 — End: 2011-09-20

## 2011-09-20 NOTE — ED Provider Notes (Signed)
History     CSN: [id number]  Arrival date & time 09/20/11  1208   First MD Initiated Contact with Patient 09/20/11 1529      Chief Complaint  Patient presents with  . Suicidal    (Consider location/radiation/quality/duration/timing/severity/associated sxs/prior treatment) HPI Comments: [name] is a 41 y.o. Female who complains of suicidal ideation on and off for 2 months since. She thinks that she will take her pills. She is under stress secondary to her husband calling her fat and concerned that he will leave her. There are other people in her home, but she is getting along well with them. She has had no recent illnesses. She does not have a therapist. She took extra Klonopin prior to coming today, 3 mg. There are no other aggravating or palliative factors.     The history is provided by the patient.    Past Medical History  Diagnosis Date  . Cellulitis and abscess of upper arm and forearm   . Cellulitis and abscess of trunk   . Attention deficit disorder without mention of hyperactivity   . Anxiety state, unspecified   . Pain in joint, site unspecified   . Unspecified asthma   . Migraine without aura, without mention of intractable migraine without mention of status migrainosus   . Cough   . Depressive disorder, not elsewhere classified   . Dizziness and giddiness   . Dysuria   . Edema   . Family history of diabetes mellitus   . Family history of asthma   . Family history of ischemic heart disease   . Other malaise and fatigue   . Female stress incontinence   . Esophageal reflux   . Unspecified essential hypertension   . Persistent disorder of initiating or maintaining sleep   . Insomnia, unspecified   . Cramp of limb   . Pain in limb   . Disturbance of skin sensation   . Acute sinusitis, unspecified   . Rash and other nonspecific skin eruption   . Tobacco use disorder   . Other psychological or physical stress, not elsewhere classified   . Tachycardia,  unspecified   . Acute upper respiratory infections of unspecified site   . Unspecified urinary incontinence   . Dizziness and giddiness   . Unspecified vitamin D deficiency   . Vomiting alone   . Abnormal weight gain   . Family history of diabetes mellitus     Past Surgical History  Procedure Date  . Partial hysterectomy 2007  . Tonsillectomy 04/29/00  . Incise and drain abcess 2011    Right axilla- Dr. Purnell Shoemaker  . Abdominal ultrasound 10/12/97    Family History  Problem Relation Age of Onset  . Asthma      Family history  . Coronary artery disease      1st degree female < 50  . Diabetes      1st degree relative  . Breast cancer Mother   . Multiple sclerosis Mother   . Cancer Mother 72    breast ca  . Lupus Sister     History  Substance Use Topics  . Smoking status: Current Everyday Smoker -- 2.0 packs/day    Types: Cigarettes  . Smokeless tobacco: Never Used  . Alcohol Use: No    OB History    Grav Para Term Preterm Abortions TAB SAB Ect Mult Living                  Review of Systems  All other systems reviewed and are negative.    Allergies  Amoxicillin; Doxycycline; Hydrochlorothiazide; and Lisinopril  Home Medications   Current Outpatient Rx  Name Route Sig Dispense Refill  . ALBUTEROL SULFATE HFA 108 (90 BASE) MCG/ACT IN AERS Inhalation Inhale 2 puffs into the lungs every 4 (four) hours as needed. For shortness of breath.    . ALBUTEROL SULFATE (2.5 MG/3ML) 0.083% IN NEBU Nebulization Take 2.5 mg by nebulization every 6 (six) hours as needed. For shortness of breath.    . AMPHETAMINE-DEXTROAMPHETAMINE 30 MG PO TABS Oral Take 30 mg by mouth 2 (two) times daily.    . ARIPIPRAZOLE 15 MG PO TABS Oral Take 15 mg by mouth daily.    [name] CLONAZEPAM 1 MG PO TABS Oral Take 2-3 mg by mouth 3 (three) times daily as needed. For anxiety.    [name] DICLOFENAC EPOLAMINE 1.3 % TD PTCH Transdermal Place 1 patch onto the skin 2 (two) times daily.     [name] DOXEPIN HCL 50 MG PO  CAPS Oral Take 50-100 mg by mouth 3 (three) times daily. 1 cap bid and 2 caps qhs    . DULOXETINE HCL 30 MG PO CPEP Oral Take 60 mg by mouth 2 (two) times daily.     . FUROSEMIDE 20 MG PO TABS Oral Take 20-40 mg by mouth daily as needed. For swelling.    [name] LOSARTAN POTASSIUM-HCTZ 100-25 MG PO TABS Oral Take 1 tablet by mouth daily.      [name] OMEPRAZOLE 20 MG PO CPDR Oral Take 1 capsule (20 mg total) by mouth daily. 30 capsule 11  . SOLIFENACIN SUCCINATE 10 MG PO TABS Oral Take 5 mg by mouth daily.      . SUMATRIPTAN SUCCINATE 100 MG PO TABS Oral Take 100 mg by mouth every 2 (two) hours as needed. For migraines.    . TRIAMCINOLONE ACETONIDE 0.1 % EX OINT Topical Apply 1 application topically 2 (two) times daily. 30 g 3  . ZOLPIDEM TARTRATE 10 MG PO TABS Oral Take 20 mg by mouth at bedtime as needed. For sleep.      BP 124/76  Pulse 78  Temp 98.2 F (36.8 C) (Oral)  Resp 18  SpO2 95%  Physical Exam  Nursing note and vitals reviewed. Constitutional: She is oriented to person, place, and time. She appears well-developed and well-nourished.  HENT:  Head: Normocephalic and atraumatic.  Eyes: Conjunctivae and EOM are normal. Pupils are equal, round, and reactive to light.  Neck: Normal range of motion and phonation normal. Neck supple.  Cardiovascular: Normal rate, regular rhythm and intact distal pulses.   Pulmonary/Chest: Effort normal and breath sounds normal. No respiratory distress. She exhibits no tenderness.  Abdominal: Soft. She exhibits no distension. There is no tenderness. There is no guarding.  Musculoskeletal: Normal range of motion.  Neurological: She is alert and oriented to person, place, and time. She has normal strength. She exhibits normal muscle tone.  Skin: Skin is warm and dry.  Psychiatric: Her behavior is normal. Judgment and thought content normal.       Depressed, tearful.    ED Course  Procedures (including critical care time)  Labs Reviewed  CBC WITH  DIFFERENTIAL - Abnormal; Notable for the following:    WBC 10.7 (*)     Hemoglobin 16.1 (*)     Neutrophils Relative 79 (*)     Neutro Abs 8.4 (*)     All other components within normal limits  COMPREHENSIVE METABOLIC PANEL -  Abnormal; Notable for the following:    Glucose, Bld 122 (*)     Alkaline Phosphatase 141 (*)     Total Bilirubin 0.2 (*)     All other components within normal limits  URINE RAPID DRUG SCREEN (HOSP PERFORMED) - Abnormal; Notable for the following:    Benzodiazepines POSITIVE (*)     All other components within normal limits  ETHANOL      1. Bipolar disorder       MDM  Bipolar with suicidal ideation, and home stressors. Doubt metabolic instability, serious bacterial infection or impending vascular collapse; the patient is stable for discharge.         23:29- Pt seen by Telepsych; he rec. D/c to home on same meds.  Flint Melter, MD 09/21/11 [phone]

## 2011-09-20 NOTE — ED Notes (Signed)
Pt reports that she usually take medications that are scheduled here for 1800 at night--pharmacy made aware reports

## 2011-09-20 NOTE — ED Notes (Signed)
Report given to rachel, rn

## 2011-09-20 NOTE — ED Notes (Signed)
Patient reports that she is suicidal and has a plan to take pills and fall asleep forever and never wake up. Patient denies any suicide attempt. Patient denies HI.

## 2011-09-20 NOTE — ED Notes (Signed)
Pt to window & now stating, "that needs to be changed to suicidal".

## 2011-09-20 NOTE — BH Assessment (Signed)
Assessment Note   [name] is an 41 y.o. female. Patient presents to [facility] with depression and suicidal thoughts. She has a history of Bipolar and has received treatment with a PA in the past. She feels that she has improved with her symptoms since seeing the PA. Patient reports in the last 2 weeks she began having marital conflict, spouse left, and was calling her down grading names. She began to feel suicidal and reports taking 1 or 2 extra Klonopin to relieve her symptoms. Patient denies that this was a suicide attempt. Patient reports feeling hopeful during the assessment. Sts she feels that her life is more stable without her spouse. Patient is able to contract for safety and feels that she is not a danger to herself. Patient denies HI and AVH's.   Patient completed a telepsych and the psychiatrist recommended discharge home. Patient signed a contract for safety form and was discharged home with the appropriate referrals.   Axis I: Bipolar, Depressed Axis II: Deferred Axis III:  Past Medical History  Diagnosis Date  . Cellulitis and abscess of upper arm and forearm   . Cellulitis and abscess of trunk   . Attention deficit disorder without mention of hyperactivity   . Anxiety state, unspecified   . Pain in joint, site unspecified   . Unspecified asthma   . Migraine without aura, without mention of intractable migraine without mention of status migrainosus   . Cough   . Depressive disorder, not elsewhere classified   . Dizziness and giddiness   . Dysuria   . Edema   . Family history of diabetes mellitus   . Family history of asthma   . Family history of ischemic heart disease   . Other malaise and fatigue   . Female stress incontinence   . Esophageal reflux   . Unspecified essential hypertension   . Persistent disorder of initiating or maintaining sleep   . Insomnia, unspecified   . Cramp of limb   . Pain in limb   . Disturbance of skin sensation   . Acute sinusitis,  unspecified   . Rash and other nonspecific skin eruption   . Tobacco use disorder   . Other psychological or physical stress, not elsewhere classified   . Tachycardia, unspecified   . Acute upper respiratory infections of unspecified site   . Unspecified urinary incontinence   . Dizziness and giddiness   . Unspecified vitamin D deficiency   . Vomiting alone   . Abnormal weight gain   . Family history of diabetes mellitus    Axis IV: problems related to social environment and problems with primary support group Axis V: 41-50 serious symptoms  Past Medical History:  Past Medical History  Diagnosis Date  . Cellulitis and abscess of upper arm and forearm   . Cellulitis and abscess of trunk   . Attention deficit disorder without mention of hyperactivity   . Anxiety state, unspecified   . Pain in joint, site unspecified   . Unspecified asthma   . Migraine without aura, without mention of intractable migraine without mention of status migrainosus   . Cough   . Depressive disorder, not elsewhere classified   . Dizziness and giddiness   . Dysuria   . Edema   . Family history of diabetes mellitus   . Family history of asthma   . Family history of ischemic heart disease   . Other malaise and fatigue   . Female stress incontinence   .  Esophageal reflux   . Unspecified essential hypertension   . Persistent disorder of initiating or maintaining sleep   . Insomnia, unspecified   . Cramp of limb   . Pain in limb   . Disturbance of skin sensation   . Acute sinusitis, unspecified   . Rash and other nonspecific skin eruption   . Tobacco use disorder   . Other psychological or physical stress, not elsewhere classified   . Tachycardia, unspecified   . Acute upper respiratory infections of unspecified site   . Unspecified urinary incontinence   . Dizziness and giddiness   . Unspecified vitamin D deficiency   . Vomiting alone   . Abnormal weight gain   . Family history of diabetes  mellitus     Past Surgical History  Procedure Date  . Partial hysterectomy 2007  . Tonsillectomy 04/29/00  . Incise and drain abcess 2011    Right axilla- Dr. Purnell Shoemaker  . Abdominal ultrasound 10/12/97    Family History:  Family History  Problem Relation Age of Onset  . Asthma      Family history  . Coronary artery disease      1st degree female < 50  . Diabetes      1st degree relative  . Breast cancer Mother   . Multiple sclerosis Mother   . Cancer Mother 1    breast ca  . Lupus Sister     Social History:  reports that she has been smoking Cigarettes.  She has been smoking about 2 packs per day. She has never used smokeless tobacco. She reports that she does not drink alcohol or use illicit drugs.  Additional Social History:  Alcohol / Drug Use Pain Medications: See listed MAR Prescriptions: See listed MAR Over the Counter: See listed MAR History of alcohol / drug use?: No history of alcohol / drug abuse  CIWA: CIWA-Ar BP: 124/76 mmHg Pulse Rate: 78  COWS:    Allergies:  Allergies  Allergen Reactions  . Amoxicillin     REACTION: QUESTIONABLE  . Doxycycline     REACTION: vomiting  . Hydrochlorothiazide     REACTION: CRAMPS AT HIGHER DOSAGES  . Lisinopril     REACTION: COUGH    Home Medications:  (Not in a hospital admission)  OB/GYN Status:  No LMP recorded. Patient has had a hysterectomy.  General Assessment Data Location of Assessment: [facility] ACT Assessment: Yes Living Arrangements: Alone Can pt return to current living arrangement?: Yes Admission Status: Voluntary Is patient capable of signing voluntary admission?: Yes Transfer from: Acute Hospital Referral Source: Self/Family/Friend  Education Status Is patient currently in school?: No  Risk to self Suicidal Ideation: Yes-Currently Present Suicidal Intent: No Is patient at risk for suicide?: No Suicidal Plan?: Yes-Currently Present Specify Current Suicidal Plan:  (pt took extra  klonopin) Access to Means: Yes (prescription medications) Specify Access to Suicidal Means:  (medications) What has been your use of drugs/alcohol within the last 12 months?:  (none reported) Previous Attempts/Gestures: No How many times?:  (0) Other Self Harm Risks:  (none reported) Intentional Self Injurious Behavior: None Family Suicide History: Unknown Recent stressful life event(s): Other (Comment);Loss (Comment);Conflict (Comment) (maritial conflict, spouse called her fat; spouse left her) Persecutory voices/beliefs?: No Depression: Yes Depression Symptoms: Despondent;Feeling angry/irritable;Feeling worthless/self pity;Loss of interest in usual pleasures Substance abuse history and/or treatment for substance abuse?: No Suicide prevention information given to non-admitted patients: Not applicable  Risk to Others Homicidal Ideation: No Thoughts of Harm to Others:  No Current Homicidal Intent: No Current Homicidal Plan: No Access to Homicidal Means: No Identified Victim:  (none reported) History of harm to others?: No Assessment of Violence: None Noted Violent Behavior Description:  (patient currently calm and cooperative) Does patient have access to weapons?: No Criminal Charges Pending?: No Does patient have a court date: No  Psychosis Hallucinations: None noted Delusions: None noted  Mental Status Report Appear/Hygiene: Other (Comment) (appropriate) Eye Contact: Fair Motor Activity: Freedom of movement Speech: Logical/coherent Level of Consciousness: Alert Mood: Depressed Affect: Appropriate to circumstance Anxiety Level: None Judgement: Impaired Orientation: Person;Place;Time;Situation Obsessive Compulsive Thoughts/Behaviors: None  Cognitive Functioning Concentration: Normal Memory: Recent Intact;Remote Intact IQ: Average Insight: Good Impulse Control: Good Appetite: Fair Weight Loss:  (none reported) Weight Gain:  (none reported) Sleep: Decreased Total  Hours of Sleep:  (2 or more hours per night) Vegetative Symptoms: None  ADLScreening [facility]) Patient's cognitive ability adequate to safely complete daily activities?: Yes Patient able to express need for assistance with ADLs?: Yes Independently performs ADLs?: Yes (appropriate for developmental age)  Abuse/Neglect [facility]) Physical Abuse: Denies Verbal Abuse: Denies Sexual Abuse: Denies  Prior Inpatient Therapy Prior Inpatient Therapy: No Prior Therapy Dates:  (n/a) Prior Therapy Facilty/Provider(s):  (n/a) Reason for Treatment:  (depression)  Prior Outpatient Therapy Prior Outpatient Therapy: No Prior Therapy Dates:  (n/a) Prior Therapy Facilty/Provider(s):  (n/a) Reason for Treatment:  (n/a)  ADL Screening (condition at time of admission) Patient's cognitive ability adequate to safely complete daily activities?: Yes Patient able to express need for assistance with ADLs?: Yes Independently performs ADLs?: Yes (appropriate for developmental age) Weakness of Legs: None Weakness of Arms/Hands: None  Home Assistive Devices/Equipment Home Assistive Devices/Equipment: None    Abuse/Neglect Assessment (Assessment to be complete while patient is alone) Physical Abuse: Denies Verbal Abuse: Denies Sexual Abuse: Denies Exploitation of patient/patient's resources: Denies Self-Neglect: Denies Values / Beliefs Cultural Requests During Hospitalization: None Spiritual Requests During Hospitalization: None   Advance Directives (For Healthcare) Advance Directive: Patient does not have advance directive Nutrition Screen- [facility] Patient's home diet: Regular  Additional Information 1:1 In Past 12 Months?: No CIRT Risk: No Elopement Risk: No Does patient have medical clearance?: Yes     Disposition:  Disposition Disposition of Patient: Other dispositions;Outpatient treatment;Referred to (Discharge per telepsych; pt contracted for safety; given  ref) Type of outpatient treatment:  (pt given out patient referrals to mental health and other et) Other disposition(s): Referred to outside facility Patient referred to: [facility];Outpatient clinic referral;Other (Comment) ([facility], [facility], etc.)  On Site Evaluation by:   Reviewed with Physician:     Melynda Ripple [facility] 09/20/2011 11:53 PM

## 2011-09-20 NOTE — ED Notes (Signed)
Bed:[facility]<BR> Expected date:<BR> Expected time:<BR> Means of arrival:<BR> Comments:<BR> Closed

## 2011-09-23 ENCOUNTER — Telehealth: Payer: Self-pay

## 2011-09-23 DIAGNOSIS — Z79899 Other long term (current) drug therapy: Secondary | ICD-10-CM

## 2011-09-23 DIAGNOSIS — F3189 Other bipolar disorder: Secondary | ICD-10-CM

## 2011-09-23 NOTE — Telephone Encounter (Signed)
Ok Lithium level Thx

## 2011-09-23 NOTE — Telephone Encounter (Signed)
Order entered

## 2011-09-23 NOTE — Telephone Encounter (Signed)
Pt called stating her Psychiatrist - Early Osmond was supposed to contact office to requesting pt have her Lithium levels checked. There is no order in EPIC, please advise.

## 2011-09-24 ENCOUNTER — Telehealth: Payer: Self-pay | Admitting: Internal Medicine

## 2011-09-24 NOTE — Telephone Encounter (Signed)
Caller: [name]/Patient; Patient Name: [name]; PCP: Plotnikov, Alex (Adults only); Best Callback Phone Number: [phone].  Call regarding Lithium lab scheduled.   Patient wanting to know if Lithium had been ordered, per Epic, advised Patient lab order was in.  Patient will come to office for lab draw on 8-28.

## 2011-09-25 ENCOUNTER — Other Ambulatory Visit (INDEPENDENT_AMBULATORY_CARE_PROVIDER_SITE_OTHER): Payer: Self-pay

## 2011-09-25 DIAGNOSIS — F3189 Other bipolar disorder: Secondary | ICD-10-CM

## 2011-09-25 DIAGNOSIS — Z79899 Other long term (current) drug therapy: Secondary | ICD-10-CM

## 2011-09-25 LAB — TSH: TSH: 4.3 u[IU]/mL (ref 0.35–5.50)

## 2011-09-25 LAB — T3, FREE: T3, Free: 3.4 pg/mL (ref 2.3–4.2)

## 2011-09-26 LAB — LITHIUM LEVEL: Lithium Lvl: 0.6 mEq/L — ABNORMAL LOW (ref 0.80–1.40)

## 2011-10-04 ENCOUNTER — Ambulatory Visit: Payer: Self-pay | Admitting: Internal Medicine

## 2011-10-16 ENCOUNTER — Ambulatory Visit: Payer: Self-pay | Admitting: Internal Medicine

## 2012-04-02 ENCOUNTER — Other Ambulatory Visit (INDEPENDENT_AMBULATORY_CARE_PROVIDER_SITE_OTHER): Payer: Medicare Other

## 2012-04-02 ENCOUNTER — Ambulatory Visit (INDEPENDENT_AMBULATORY_CARE_PROVIDER_SITE_OTHER): Payer: Medicare Other | Admitting: Internal Medicine

## 2012-04-02 ENCOUNTER — Encounter: Payer: Self-pay | Admitting: Internal Medicine

## 2012-04-02 VITALS — BP 110/70 | HR 72 | Temp 98.2°F | Resp 16 | Wt 220.0 lb

## 2012-04-02 DIAGNOSIS — M545 Low back pain, unspecified: Secondary | ICD-10-CM | POA: Insufficient documentation

## 2012-04-02 DIAGNOSIS — R202 Paresthesia of skin: Secondary | ICD-10-CM

## 2012-04-02 DIAGNOSIS — F3289 Other specified depressive episodes: Secondary | ICD-10-CM

## 2012-04-02 DIAGNOSIS — F329 Major depressive disorder, single episode, unspecified: Secondary | ICD-10-CM

## 2012-04-02 DIAGNOSIS — I10 Essential (primary) hypertension: Secondary | ICD-10-CM

## 2012-04-02 DIAGNOSIS — G43009 Migraine without aura, not intractable, without status migrainosus: Secondary | ICD-10-CM

## 2012-04-02 DIAGNOSIS — R209 Unspecified disturbances of skin sensation: Secondary | ICD-10-CM

## 2012-04-02 DIAGNOSIS — F32A Depression, unspecified: Secondary | ICD-10-CM

## 2012-04-02 DIAGNOSIS — G47419 Narcolepsy without cataplexy: Secondary | ICD-10-CM | POA: Insufficient documentation

## 2012-04-02 DIAGNOSIS — F411 Generalized anxiety disorder: Secondary | ICD-10-CM

## 2012-04-02 DIAGNOSIS — J45909 Unspecified asthma, uncomplicated: Secondary | ICD-10-CM

## 2012-04-02 DIAGNOSIS — E559 Vitamin D deficiency, unspecified: Secondary | ICD-10-CM

## 2012-04-02 LAB — CBC WITH DIFFERENTIAL/PLATELET
Basophils Absolute: 0.1 10*3/uL (ref 0.0–0.1)
Eosinophils Absolute: 0.3 10*3/uL (ref 0.0–0.7)
Lymphocytes Relative: 29.7 % (ref 12.0–46.0)
MCHC: 34.4 g/dL (ref 30.0–36.0)
Neutro Abs: 5.2 10*3/uL (ref 1.4–7.7)
Neutrophils Relative %: 56.3 % (ref 43.0–77.0)
RDW: 12.1 % (ref 11.5–14.6)

## 2012-04-02 LAB — BASIC METABOLIC PANEL
CO2: 24 mEq/L (ref 19–32)
Calcium: 9.1 mg/dL (ref 8.4–10.5)
Creatinine, Ser: 0.7 mg/dL (ref 0.4–1.2)
Glucose, Bld: 96 mg/dL (ref 70–99)

## 2012-04-02 LAB — URINALYSIS
Ketones, ur: NEGATIVE
Leukocytes, UA: NEGATIVE
Nitrite: NEGATIVE
Specific Gravity, Urine: >=1.03 (ref 1.000–1.030)
pH: 5.5 (ref 5.0–8.0)

## 2012-04-02 LAB — VITAMIN B12: Vitamin B-12: 530 pg/mL (ref 211–911)

## 2012-04-02 LAB — HEPATIC FUNCTION PANEL
Albumin: 3.8 g/dL (ref 3.5–5.2)
Alkaline Phosphatase: 119 U/L — ABNORMAL HIGH (ref 39–117)

## 2012-04-02 LAB — TSH: TSH: 2.41 u[IU]/mL (ref 0.35–5.50)

## 2012-04-02 MED ORDER — CLONAZEPAM 1 MG PO TABS: 2.0000 mg | ORAL_TABLET | Freq: Three times a day (TID) | ORAL | Status: DC | PRN

## 2012-04-02 MED ORDER — ALBUTEROL SULFATE HFA 108 (90 BASE) MCG/ACT IN AERS: 2.0000 | INHALATION_SPRAY | RESPIRATORY_TRACT | Status: DC | PRN

## 2012-04-02 MED ORDER — ARIPIPRAZOLE 15 MG PO TABS: 15.0000 mg | ORAL_TABLET | Freq: Every day | ORAL | Status: DC

## 2012-04-02 MED ORDER — AMPHETAMINE-DEXTROAMPHETAMINE 30 MG PO TABS: 30.0000 mg | ORAL_TABLET | Freq: Every day | ORAL | Status: DC

## 2012-04-02 MED ORDER — ZOLPIDEM TARTRATE 10 MG PO TABS: 20.0000 mg | ORAL_TABLET | Freq: Every evening | ORAL | Status: DC | PRN

## 2012-04-02 MED ORDER — DULOXETINE HCL 30 MG PO CPEP: 60.0000 mg | ORAL_CAPSULE | Freq: Every day | ORAL | Status: DC

## 2012-04-02 NOTE — Assessment & Plan Note (Signed)
On Adderall 

## 2012-04-02 NOTE — Progress Notes (Signed)
   Subjective:    Back Pain Pertinent negatives include no abdominal pain, headaches, numbness or weakness.     The patient is here to follow up on chronic bipolar depression, anxiety, headaches and chronic moderate fibromyalgia and LBP symptoms partially controlled with medicines, diet and exercise. F/u HTN, asthma. She lost 45 lbs. She is on [employer] disability for her bipolar   Review of Systems  Constitutional: Positive for fatigue. Negative for chills, activity change, appetite change and unexpected weight change.  HENT: Negative for congestion, mouth sores and sinus pressure.   Eyes: Negative for visual disturbance.  Respiratory: Positive for cough. Negative for chest tightness.   Gastrointestinal: Negative for nausea and abdominal pain.  Genitourinary: Negative for frequency, difficulty urinating and vaginal pain.  Musculoskeletal: Positive for back pain. Negative for gait problem.  Skin: Negative for pallor and rash.  Neurological: Negative for dizziness, tremors, weakness, numbness and headaches.  Psychiatric/Behavioral: Positive for behavioral problems, sleep disturbance and dysphoric mood. Negative for suicidal ideas and confusion. The patient is nervous/anxious.        Objective:   Physical Exam  Constitutional: She appears well-developed. No distress.  obese  HENT:  Head: Normocephalic.  Right Ear: External ear normal.  Left Ear: External ear normal.  Nose: Nose normal.  Mouth/Throat: Oropharynx is clear and moist.  Eyes: Conjunctivae are normal. Pupils are equal, round, and reactive to light. Right eye exhibits no discharge. Left eye exhibits no discharge.  Neck: Normal range of motion. Neck supple. No JVD present. No tracheal deviation present. No thyromegaly present.  Cardiovascular: Normal rate, regular rhythm and normal heart sounds.   Pulmonary/Chest: No stridor. No respiratory distress. She has no wheezes.  Abdominal: Soft. Bowel sounds are normal. She exhibits  no distension and no mass. There is no tenderness. There is no rebound and no guarding.  Musculoskeletal: She exhibits no edema and no tenderness.  Lymphadenopathy:    She has no cervical adenopathy.  Neurological: She displays tremor. She displays normal reflexes. No cranial nerve deficit. She exhibits normal muscle tone. Coordination normal.  Skin: No rash noted. No erythema.  Psychiatric: Her behavior is normal. Judgment and thought content normal. Her mood appears anxious. Thought content is not paranoid. She exhibits a depressed mood. She expresses no homicidal and no suicidal ideation.          Assessment & Plan:

## 2012-04-02 NOTE — Assessment & Plan Note (Signed)
Bipolar - chronic. On disability now Continue with current prescription therapy as reflected on the Med list.

## 2012-04-02 NOTE — Assessment & Plan Note (Signed)
Continue with current prescription therapy as reflected on the Med list.  

## 2012-04-02 NOTE — Assessment & Plan Note (Signed)
E-cigs - trying now Continue with current prescription therapy as reflected on the Med list.

## 2012-04-02 NOTE — Assessment & Plan Note (Signed)
Continue with current prescription therapy as reflected on the Med list. Better 

## 2012-04-02 NOTE — Assessment & Plan Note (Signed)
Continue with current  therapy as reflected on the Med list. Labs

## 2012-04-09 ENCOUNTER — Ambulatory Visit: Payer: Self-pay | Admitting: Orthopedic Surgery

## 2012-04-23 ENCOUNTER — Telehealth: Payer: Self-pay | Admitting: *Deleted

## 2012-04-23 DIAGNOSIS — R0683 Snoring: Secondary | ICD-10-CM

## 2012-04-23 NOTE — Telephone Encounter (Signed)
Left msg on triage received results of mychart. MD wanted her to call if she want to proceed with the sleep study. Pt states she would like to have test done...[name]

## 2012-04-24 NOTE — Telephone Encounter (Signed)
Ok - ordered Thx 

## 2012-04-24 NOTE — Telephone Encounter (Signed)
I called and spoke to pt letting her know the sleep study has been ordered and someone she be in contact with her. She expressed understanding and had no questions/concerns.

## 2012-05-06 ENCOUNTER — Ambulatory Visit (HOSPITAL_BASED_OUTPATIENT_CLINIC_OR_DEPARTMENT_OTHER): Payer: Medicare Other | Attending: Internal Medicine | Admitting: Radiology

## 2012-05-06 ENCOUNTER — Encounter: Payer: Self-pay | Admitting: Internal Medicine

## 2012-05-06 ENCOUNTER — Ambulatory Visit (INDEPENDENT_AMBULATORY_CARE_PROVIDER_SITE_OTHER): Payer: Medicare Other | Admitting: Internal Medicine

## 2012-05-06 VITALS — Ht 66.0 in | Wt 213.0 lb

## 2012-05-06 VITALS — BP 120/88 | HR 80 | Resp 16 | Wt 213.0 lb

## 2012-05-06 DIAGNOSIS — E559 Vitamin D deficiency, unspecified: Secondary | ICD-10-CM

## 2012-05-06 DIAGNOSIS — I10 Essential (primary) hypertension: Secondary | ICD-10-CM

## 2012-05-06 DIAGNOSIS — F329 Major depressive disorder, single episode, unspecified: Secondary | ICD-10-CM

## 2012-05-06 DIAGNOSIS — G4700 Insomnia, unspecified: Secondary | ICD-10-CM

## 2012-05-06 DIAGNOSIS — M545 Low back pain, unspecified: Secondary | ICD-10-CM

## 2012-05-06 DIAGNOSIS — F3289 Other specified depressive episodes: Secondary | ICD-10-CM

## 2012-05-06 DIAGNOSIS — J45909 Unspecified asthma, uncomplicated: Secondary | ICD-10-CM

## 2012-05-06 DIAGNOSIS — G47419 Narcolepsy without cataplexy: Secondary | ICD-10-CM

## 2012-05-06 DIAGNOSIS — G4733 Obstructive sleep apnea (adult) (pediatric): Secondary | ICD-10-CM

## 2012-05-06 DIAGNOSIS — F988 Other specified behavioral and emotional disorders with onset usually occurring in childhood and adolescence: Secondary | ICD-10-CM

## 2012-05-06 DIAGNOSIS — R0683 Snoring: Secondary | ICD-10-CM

## 2012-05-06 DIAGNOSIS — F32A Depression, unspecified: Secondary | ICD-10-CM

## 2012-05-06 MED ORDER — TRAMADOL HCL 50 MG PO TABS: 50.0000 mg | ORAL_TABLET | Freq: Two times a day (BID) | ORAL | Status: DC | PRN

## 2012-05-06 MED ORDER — AMPHETAMINE-DEXTROAMPHETAMINE 30 MG PO TABS: 30.0000 mg | ORAL_TABLET | Freq: Every day | ORAL | Status: DC

## 2012-05-06 NOTE — Assessment & Plan Note (Signed)
Pain med Dr Metta Clines - [place] appt in 5/14 Tramadol prn

## 2012-05-06 NOTE — Assessment & Plan Note (Signed)
Continue with current prescription therapy as reflected on the Med list.  

## 2012-05-06 NOTE — Assessment & Plan Note (Signed)
Home sleep test - soon

## 2012-05-06 NOTE — Progress Notes (Signed)
The Technologist demonstrated  the application of the Home Sleep Device to the patient. The patient also had the opportunity to perform a teach-back demonstration to ensure complete understanding of the application without difficulty.Marland Kitchenvlb

## 2012-05-06 NOTE — Progress Notes (Signed)
Subjective:    Back Pain This is a chronic problem. The current episode started more than 1 month ago (fell at [facility] in Sept 2013). The problem occurs constantly. The problem has been gradually worsening since onset. The pain is present in the lumbar spine. The quality of the pain is described as aching. The pain does not radiate. The pain is at a severity of 8/10. The pain is severe. The pain is worse during the day. The symptoms are aggravated by bending, standing and twisting. Pertinent negatives include no abdominal pain, headaches, numbness or weakness. She has tried analgesics, NSAIDs and muscle relaxant (injections) for the symptoms. The treatment provided mild relief.   Saw an Ortho Dr Floyce Stakes at [facility] - had an MRI - bulging disk; had injections x3 - helped x 1.5 wk  The patient is here to follow up on chronic bipolar depression, anxiety, headaches and chronic moderate fibromyalgia and LBP symptoms partially controlled with medicines, diet and exercise. F/u HTN, asthma. She lost 45 lbs. She is on [employer] disability for her bipolar  Wt Readings from Last 3 Encounters:  05/06/12 213 lb (96.616 kg)  04/02/12 220 lb (99.791 kg)  10/16/10 260 lb (117.935 kg)   BP Readings from Last 3 Encounters:  05/06/12 120/88  04/02/12 110/70  09/20/11 124/76       Review of Systems  Constitutional: Positive for fatigue. Negative for chills, activity change, appetite change and unexpected weight change.  HENT: Negative for congestion, mouth sores and sinus pressure.   Eyes: Negative for visual disturbance.  Respiratory: Positive for cough. Negative for chest tightness.   Gastrointestinal: Negative for nausea and abdominal pain.  Genitourinary: Negative for frequency, difficulty urinating and vaginal pain.  Musculoskeletal: Positive for back pain. Negative for gait problem.  Skin: Negative for pallor and rash.  Neurological: Negative for dizziness, tremors, weakness, numbness and headaches.   Psychiatric/Behavioral: Positive for behavioral problems, sleep disturbance and dysphoric mood. Negative for suicidal ideas and confusion. The patient is nervous/anxious.        Objective:   Physical Exam  Constitutional: She appears well-developed. No distress.  obese  HENT:  Head: Normocephalic.  Right Ear: External ear normal.  Left Ear: External ear normal.  Nose: Nose normal.  Mouth/Throat: Oropharynx is clear and moist.  Eyes: Conjunctivae are normal. Pupils are equal, round, and reactive to light. Right eye exhibits no discharge. Left eye exhibits no discharge.  Neck: Normal range of motion. Neck supple. No JVD present. No tracheal deviation present. No thyromegaly present.  Cardiovascular: Normal rate, regular rhythm and normal heart sounds.   Pulmonary/Chest: No stridor. No respiratory distress. She has no wheezes.  Abdominal: Soft. Bowel sounds are normal. She exhibits no distension and no mass. There is no tenderness. There is no rebound and no guarding.  Musculoskeletal: She exhibits no edema and no tenderness.  Lymphadenopathy:    She has no cervical adenopathy.  Neurological: She displays tremor. She displays normal reflexes. No cranial nerve deficit. She exhibits normal muscle tone. Coordination normal.  Skin: No rash noted. No erythema.  Psychiatric: Her behavior is normal. Judgment and thought content normal. Her mood appears anxious. Thought content is not paranoid. She exhibits a depressed mood. She expresses no homicidal and no suicidal ideation.   Lab Results  Component Value Date   WBC 9.2 04/02/2012   HGB 15.9* 04/02/2012   HCT 46.2* 04/02/2012   PLT 221.0 04/02/2012   GLUCOSE 96 04/02/2012   CHOL 158 07/29/2011  TRIG 151.0* 07/29/2011   HDL 26.30* 07/29/2011   LDLDIRECT 111.6 05/24/2010   LDLCALC 102* 07/29/2011   ALT 22 04/02/2012   AST 19 04/02/2012   NA 138 04/02/2012   K 4.7 04/02/2012   CL 104 04/02/2012   CREATININE 0.7 04/02/2012   BUN 8 04/02/2012   CO2 24 04/02/2012    TSH 2.41 04/02/2012   HGBA1C 5.8* 04/28/2010           Assessment & Plan:

## 2012-05-06 NOTE — Assessment & Plan Note (Signed)
Doing ok.

## 2012-05-06 NOTE — Assessment & Plan Note (Signed)
Bipolar - chronic. On disability now Continue with current prescription therapy as reflected on the Med list.

## 2012-05-12 ENCOUNTER — Encounter: Payer: Self-pay | Admitting: Internal Medicine

## 2012-05-12 ENCOUNTER — Encounter (HOSPITAL_BASED_OUTPATIENT_CLINIC_OR_DEPARTMENT_OTHER): Payer: Medicare Other

## 2012-05-16 DIAGNOSIS — G4733 Obstructive sleep apnea (adult) (pediatric): Secondary | ICD-10-CM

## 2012-05-18 ENCOUNTER — Telehealth: Payer: Self-pay | Admitting: Internal Medicine

## 2012-05-18 NOTE — Telephone Encounter (Signed)
Patient had a sleep study back on  05/06/12 and she can see her results on mychart, but she never received a call about them and she would like to speak with someone about the results

## 2012-05-19 NOTE — Telephone Encounter (Signed)
I can't see the report. Pls help Thx

## 2012-05-19 NOTE — Telephone Encounter (Signed)
Please advise on sleep study results

## 2012-05-19 NOTE — Telephone Encounter (Signed)
The sleep study results are in your Red folder for review.

## 2012-05-20 NOTE — Telephone Encounter (Signed)
Stacey, please, inform patient that her sleep test was normal except for some insomnia. Take clonazepam for insomnia prn  Thx

## 2012-05-21 NOTE — Telephone Encounter (Signed)
Pt informed

## 2012-05-28 ENCOUNTER — Ambulatory Visit: Payer: Self-pay | Admitting: Pain Medicine

## 2012-06-10 ENCOUNTER — Ambulatory Visit: Payer: Self-pay | Admitting: Pain Medicine

## 2012-06-12 ENCOUNTER — Ambulatory Visit: Payer: Self-pay | Admitting: Pain Medicine

## 2012-06-16 ENCOUNTER — Ambulatory Visit (INDEPENDENT_AMBULATORY_CARE_PROVIDER_SITE_OTHER): Payer: Medicare Other | Admitting: Internal Medicine

## 2012-06-16 ENCOUNTER — Encounter: Payer: Self-pay | Admitting: Internal Medicine

## 2012-06-16 VITALS — BP 132/98 | HR 80 | Resp 16 | Wt 210.0 lb

## 2012-06-16 DIAGNOSIS — R42 Dizziness and giddiness: Secondary | ICD-10-CM

## 2012-06-16 DIAGNOSIS — F32A Depression, unspecified: Secondary | ICD-10-CM

## 2012-06-16 DIAGNOSIS — F329 Major depressive disorder, single episode, unspecified: Secondary | ICD-10-CM

## 2012-06-16 DIAGNOSIS — I10 Essential (primary) hypertension: Secondary | ICD-10-CM

## 2012-06-16 DIAGNOSIS — F3289 Other specified depressive episodes: Secondary | ICD-10-CM

## 2012-06-16 DIAGNOSIS — F411 Generalized anxiety disorder: Secondary | ICD-10-CM

## 2012-06-16 MED ORDER — GABAPENTIN 300 MG PO CAPS: 300.0000 mg | ORAL_CAPSULE | Freq: Three times a day (TID) | ORAL | Status: DC
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
## 2012-06-27 NOTE — Assessment & Plan Note (Signed)
5/14 worse  See meds

## 2012-06-27 NOTE — Assessment & Plan Note (Signed)
Continue with current prescription therapy as reflected on the Med list.  

## 2012-06-27 NOTE — Assessment & Plan Note (Signed)
Continue with current prn therapy as reflected on the Med list.  

## 2012-07-09 ENCOUNTER — Ambulatory Visit: Payer: Self-pay | Admitting: Pain Medicine

## 2012-07-10 ENCOUNTER — Ambulatory Visit (INDEPENDENT_AMBULATORY_CARE_PROVIDER_SITE_OTHER): Payer: Medicare Other | Admitting: Internal Medicine

## 2012-07-10 ENCOUNTER — Encounter: Payer: Self-pay | Admitting: Internal Medicine

## 2012-07-10 VITALS — BP 120/90 | HR 100 | Temp 98.5°F | Resp 16 | Wt 210.0 lb

## 2012-07-10 DIAGNOSIS — M545 Low back pain, unspecified: Secondary | ICD-10-CM

## 2012-07-10 DIAGNOSIS — I10 Essential (primary) hypertension: Secondary | ICD-10-CM

## 2012-07-10 DIAGNOSIS — F329 Major depressive disorder, single episode, unspecified: Secondary | ICD-10-CM

## 2012-07-10 DIAGNOSIS — F3289 Other specified depressive episodes: Secondary | ICD-10-CM

## 2012-07-10 DIAGNOSIS — Z23 Encounter for immunization: Secondary | ICD-10-CM

## 2012-07-10 DIAGNOSIS — R635 Abnormal weight gain: Secondary | ICD-10-CM

## 2012-07-10 DIAGNOSIS — J45909 Unspecified asthma, uncomplicated: Secondary | ICD-10-CM

## 2012-07-10 DIAGNOSIS — F32A Depression, unspecified: Secondary | ICD-10-CM

## 2012-07-10 MED ORDER — GABAPENTIN 300 MG PO CAPS: 300.0000 mg | ORAL_CAPSULE | Freq: Three times a day (TID) | ORAL | Status: DC

## 2012-07-10 MED ORDER — AMPHETAMINE-DEXTROAMPHETAMINE 30 MG PO TABS: 30.0000 mg | ORAL_TABLET | Freq: Every day | ORAL | Status: DC

## 2012-07-10 MED ORDER — OMEPRAZOLE 20 MG PO CPDR: 20.0000 mg | DELAYED_RELEASE_CAPSULE | Freq: Every day | ORAL | Status: DC

## 2012-07-10 MED ORDER — TRIAZOLAM 0.25 MG PO TABS: 0.2500 mg | ORAL_TABLET | Freq: Every evening | ORAL | Status: DC | PRN
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
## 2012-07-12 NOTE — Assessment & Plan Note (Signed)
Wt Readings from Last 3 Encounters:  07/10/12 210 lb (95.255 kg)  06/16/12 210 lb (95.255 kg)  05/06/12 213 lb (96.616 kg)

## 2012-07-12 NOTE — Assessment & Plan Note (Signed)
Better Continue with current prescription therapy as reflected on the Med list.  

## 2012-07-12 NOTE — Assessment & Plan Note (Signed)
Continue with current prescription therapy as reflected on the Med list.  

## 2012-07-16 ENCOUNTER — Telehealth: Payer: Self-pay | Admitting: *Deleted

## 2012-07-16 NOTE — Telephone Encounter (Signed)
Received PA request from [facility] for Temazepam. Called patient because med was not in her active med list. Patient states that she has already taken care of this herself and that we can disregard PA request.

## 2012-07-20 ENCOUNTER — Ambulatory Visit: Payer: Self-pay | Admitting: Pain Medicine

## 2012-08-03 ENCOUNTER — Telehealth: Payer: Self-pay | Admitting: Internal Medicine

## 2012-08-03 NOTE — Telephone Encounter (Signed)
Take Advil 2 tabs tid prn. OV if needed Thx

## 2012-08-03 NOTE — Telephone Encounter (Signed)
Patient states she has had migraine x 2 weeks.  Imitrex not relieving pain.  What to do?

## 2012-08-04 ENCOUNTER — Ambulatory Visit: Payer: Medicare Other | Admitting: Internal Medicine

## 2012-08-04 NOTE — Telephone Encounter (Signed)
Pt informed

## 2012-08-05 ENCOUNTER — Telehealth: Payer: Self-pay | Admitting: *Deleted

## 2012-08-05 NOTE — Telephone Encounter (Signed)
OK to fill this prescription with additional refills x2 Thank you!  

## 2012-08-05 NOTE — Telephone Encounter (Signed)
Rf req for Clonazepam 1 mg. Last dispensed 07/09/12. Ok to Rf?

## 2012-08-06 MED ORDER — CLONAZEPAM 1 MG PO TABS: 2.0000 mg | ORAL_TABLET | Freq: Three times a day (TID) | ORAL | Status: DC | PRN

## 2012-08-06 NOTE — Telephone Encounter (Signed)
Done

## 2012-08-14 ENCOUNTER — Encounter: Payer: Self-pay | Admitting: Internal Medicine

## 2012-08-14 ENCOUNTER — Other Ambulatory Visit (HOSPITAL_COMMUNITY)
Admission: RE | Admit: 2012-08-14 | Discharge: 2012-08-14 | Disposition: A | Discharge status: Home / Self Care | Payer: Medicare Other | Source: Ambulatory Visit | Attending: Internal Medicine | Admitting: Internal Medicine

## 2012-08-14 ENCOUNTER — Ambulatory Visit (INDEPENDENT_AMBULATORY_CARE_PROVIDER_SITE_OTHER): Payer: Medicare Other | Admitting: Internal Medicine

## 2012-08-14 VITALS — BP 126/98 | HR 112 | Temp 98.4°F | Ht 66.0 in | Wt 206.0 lb

## 2012-08-14 DIAGNOSIS — Z124 Encounter for screening for malignant neoplasm of cervix: Secondary | ICD-10-CM

## 2012-08-14 DIAGNOSIS — N3289 Other specified disorders of bladder: Secondary | ICD-10-CM

## 2012-08-14 DIAGNOSIS — Z23 Encounter for immunization: Secondary | ICD-10-CM

## 2012-08-14 DIAGNOSIS — R3989 Other symptoms and signs involving the genitourinary system: Secondary | ICD-10-CM

## 2012-08-14 DIAGNOSIS — E785 Hyperlipidemia, unspecified: Secondary | ICD-10-CM

## 2012-08-14 DIAGNOSIS — Z0000 Encounter for general adult medical examination without abnormal findings: Secondary | ICD-10-CM

## 2012-08-14 DIAGNOSIS — Z131 Encounter for screening for diabetes mellitus: Secondary | ICD-10-CM

## 2012-08-14 DIAGNOSIS — Z01419 Encounter for gynecological examination (general) (routine) without abnormal findings: Secondary | ICD-10-CM | POA: Insufficient documentation

## 2012-08-14 DIAGNOSIS — G43909 Migraine, unspecified, not intractable, without status migrainosus: Secondary | ICD-10-CM

## 2012-08-14 LAB — POCT URINALYSIS DIPSTICK
Ketones, UA: NEGATIVE
Protein, UA: NEGATIVE
Spec Grav, UA: 1.01
pH, UA: 6

## 2012-08-14 MED ORDER — NORTRIPTYLINE HCL 25 MG PO CAPS: 25.0000 mg | ORAL_CAPSULE | Freq: Every day | ORAL | Status: DC

## 2012-08-14 MED ORDER — SUMATRIPTAN SUCCINATE 100 MG PO TABS: 100.0000 mg | ORAL_TABLET | ORAL | Status: DC | PRN

## 2012-08-14 NOTE — Progress Notes (Signed)
HPI:  Pt presents to the clinic today for her medicare wellness visit and her pap smear. She has no concerns today. She denies vaginal pain, discharge, odor, abnormal uterine bleeding. She is having urinary symptoms. She has bladder pressure and frequency. She denies blood in her urine. She has not taken anything OTC for this. She denies fever, chills or body aches. She also c/o migraines. This is a chronic issue for her. She gets them 4 times a week. There is associated nausea and vomiting. She is sensitive to light and sound. She has tried ibuprofen and imitrex. This has not help. She has never been on a daily preventative medication for this.  Past Medical History  Diagnosis Date  . Cellulitis and abscess of upper arm and forearm   . Cellulitis and abscess of trunk   . Attention deficit disorder without mention of hyperactivity   . Anxiety state, unspecified   . Pain in joint, site unspecified   . Unspecified asthma(493.90)   . Migraine without aura, without mention of intractable migraine without mention of status migrainosus   . Cough   . Depressive disorder, not elsewhere classified   . Dizziness and giddiness   . Dysuria   . Edema   . Family history of diabetes mellitus   . Family history of asthma   . Family history of ischemic heart disease   . Other malaise and fatigue   . Female stress incontinence   . Esophageal reflux   . Unspecified essential hypertension   . Persistent disorder of initiating or maintaining sleep   . Insomnia, unspecified   . Cramp of limb   . Pain in limb   . Disturbance of skin sensation   . Acute sinusitis, unspecified   . Rash and other nonspecific skin eruption   . Tobacco use disorder   . Other psychological or physical stress, not elsewhere classified(V62.89)   . Tachycardia, unspecified   . Acute upper respiratory infections of unspecified site   . Unspecified urinary incontinence   . Dizziness and giddiness   . Unspecified vitamin D  deficiency   . Vomiting alone   . Abnormal weight gain   . Family history of diabetes mellitus     Current Outpatient Prescriptions  Medication Sig Dispense Refill  . albuterol (PROAIR HFA) 108 (90 BASE) MCG/ACT inhaler Inhale 2 puffs into the lungs every 4 (four) hours as needed. For shortness of breath.  1 Inhaler  5  . albuterol (PROVENTIL) (2.5 MG/3ML) 0.083% nebulizer solution Take 2.5 mg by nebulization every 6 (six) hours as needed. For shortness of breath.      . amphetamine-dextroamphetamine (ADDERALL) 30 MG tablet Take 1 tablet (30 mg total) by mouth daily. Please fill on or after 10/06/12  30 tablet  0  . ARIPiprazole (ABILIFY) 15 MG tablet Take 1 tablet (15 mg total) by mouth daily.  30 tablet  5  . clonazePAM (KLONOPIN) 1 MG tablet Take 2-3 tablets (2-3 mg total) by mouth 3 (three) times daily as needed. For anxiety.  90 tablet  2  . DULoxetine (CYMBALTA) 30 MG capsule Take 2 capsules (60 mg total) by mouth daily.  30 capsule  5  . furosemide (LASIX) 20 MG tablet Take 20-40 mg by mouth daily as needed. For swelling.      . gabapentin (NEURONTIN) 300 MG capsule Take 1 capsule (300 mg total) by mouth 3 (three) times daily.  90 capsule  3  . losartan-hydrochlorothiazide (HYZAAR) 100-25 MG per tablet  Take 1 tablet by mouth daily.        [name] omeprazole (PRILOSEC) 20 MG capsule Take 1 capsule (20 mg total) by mouth daily.  30 capsule  11  . solifenacin (VESICARE) 10 MG tablet Take 5 mg by mouth daily.        . SUMAtriptan (IMITREX) 100 MG tablet Take 100 mg by mouth every 2 (two) hours as needed. For migraines.      . traMADol (ULTRAM) 50 MG tablet Take 1-2 tablets (50-100 mg total) by mouth 2 (two) times daily as needed for pain.  100 tablet  1  . triamcinolone (KENALOG) 0.1 % ointment Apply 1 application topically 2 (two) times daily.  30 g  3  . triazolam (HALCION) 0.25 MG tablet Take 1 tablet (0.25 mg total) by mouth at bedtime as needed.  30 tablet  3   No current  facility-administered medications for this visit.    Allergies  Allergen Reactions  . Amoxicillin     REACTION: QUESTIONABLE  . Doxycycline     REACTION: vomiting  . Hydrochlorothiazide     REACTION: CRAMPS AT HIGHER DOSAGES  . Lisinopril     REACTION: COUGH  . Percocet (Oxycodone-Acetaminophen)     itching  . Talwin (Pentazocine)     n/v    Family History  Problem Relation Age of Onset  . Asthma      Family history  . Coronary artery disease      1st degree female < 50  . Diabetes      1st degree relative  . Breast cancer Mother   . Multiple sclerosis Mother   . Cancer Mother 44    breast ca  . Lupus Sister     History   Social History  . Marital Status: Married    Spouse Name: N/A    Number of Children: 2  . Years of Education: N/A   Occupational History  . 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 Smoking status: Current Every Day Smoker -- 2.00 packs/day    Types: Cigarettes  . Smokeless tobacco: Never Used  . Alcohol Use: No  . Drug Use: No  . Sexually Active: Yes   Other Topics Concern  . Not on file   Social History Narrative  . No narrative on file    Hospitiliaztions:  Health Maintenance:    Flu: 2011  Tetanus: 2014  Pneumovax: never  Pap smear: 2012  LMP: partial hysterectomy  Eye Doctor: as needed  Dental Exam: as needed   Past Medical History  Diagnosis Date  . Cellulitis and abscess of upper arm and forearm   . Cellulitis and abscess of trunk   . Attention deficit disorder without mention of hyperactivity   . Anxiety state, unspecified   . Pain in joint, site unspecified   . Unspecified asthma(493.90)   . Migraine without aura, without mention of intractable migraine without mention of status migrainosus   . Cough   . Depressive disorder, not elsewhere classified   . Dizziness and giddiness   . Dysuria   . Edema   . Family history of diabetes  mellitus   . Family history of asthma   . Family history of ischemic heart disease   . Other malaise and fatigue   . Female stress incontinence   . Esophageal reflux   . Unspecified essential hypertension   .  Persistent disorder of initiating or maintaining sleep   . Insomnia, unspecified   . Cramp of limb   . Pain in limb   . Disturbance of skin sensation   . Acute sinusitis, unspecified   . Rash and other nonspecific skin eruption   . Tobacco use disorder   . Other psychological or physical stress, not elsewhere classified(V62.89)   . Tachycardia, unspecified   . Acute upper respiratory infections of unspecified site   . Unspecified urinary incontinence   . Dizziness and giddiness   . Unspecified vitamin D deficiency   . Vomiting alone   . Abnormal weight gain   . Family history of diabetes mellitus     Current Outpatient Prescriptions  Medication Sig Dispense Refill  . albuterol (PROAIR HFA) 108 (90 BASE) MCG/ACT inhaler Inhale 2 puffs into the lungs every 4 (four) hours as needed. For shortness of breath.  1 Inhaler  5  . albuterol (PROVENTIL) (2.5 MG/3ML) 0.083% nebulizer solution Take 2.5 mg by nebulization every 6 (six) hours as needed. For shortness of breath.      . amphetamine-dextroamphetamine (ADDERALL) 30 MG tablet Take 1 tablet (30 mg total) by mouth daily. Please fill on or after 10/06/12  30 tablet  0  . ARIPiprazole (ABILIFY) 15 MG tablet Take 1 tablet (15 mg total) by mouth daily.  30 tablet  5  . clonazePAM (KLONOPIN) 1 MG tablet Take 2-3 tablets (2-3 mg total) by mouth 3 (three) times daily as needed. For anxiety.  90 tablet  2  . DULoxetine (CYMBALTA) 30 MG capsule Take 2 capsules (60 mg total) by mouth daily.  30 capsule  5  . furosemide (LASIX) 20 MG tablet Take 20-40 mg by mouth daily as needed. For swelling.      . gabapentin (NEURONTIN) 300 MG capsule Take 1 capsule (300 mg total) by mouth 3 (three) times daily.  90 capsule  3  .  losartan-hydrochlorothiazide (HYZAAR) 100-25 MG per tablet Take 1 tablet by mouth daily.        [name] omeprazole (PRILOSEC) 20 MG capsule Take 1 capsule (20 mg total) by mouth daily.  30 capsule  11  . solifenacin (VESICARE) 10 MG tablet Take 5 mg by mouth daily.        . SUMAtriptan (IMITREX) 100 MG tablet Take 100 mg by mouth every 2 (two) hours as needed. For migraines.      . traMADol (ULTRAM) 50 MG tablet Take 1-2 tablets (50-100 mg total) by mouth 2 (two) times daily as needed for pain.  100 tablet  1  . triamcinolone (KENALOG) 0.1 % ointment Apply 1 application topically 2 (two) times daily.  30 g  3  . triazolam (HALCION) 0.25 MG tablet Take 1 tablet (0.25 mg total) by mouth at bedtime as needed.  30 tablet  3   No current facility-administered medications for this visit.    Allergies  Allergen Reactions  . Amoxicillin     REACTION: QUESTIONABLE  . Doxycycline     REACTION: vomiting  . Hydrochlorothiazide     REACTION: CRAMPS AT HIGHER DOSAGES  . Lisinopril     REACTION: COUGH  . Percocet (Oxycodone-Acetaminophen)     itching  . Talwin (Pentazocine)     n/v    Family History  Problem Relation Age of Onset  . Asthma      Family history  . Coronary artery disease      1st degree female < 50  . Diabetes  1st degree relative  . Breast cancer Mother   . Multiple sclerosis Mother   . Cancer Mother 81    breast ca  . Lupus Sister     History   Social History  . Marital Status: Married    Spouse Name: N/A    Number of Children: 2  . Years of Education: N/A   Occupational History  . 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 Smoking status: Current Every Day Smoker -- 2.00 packs/day    Types: Cigarettes  . Smokeless tobacco: Never Used  . Alcohol Use: No  . Drug Use: No  . Sexually Active: Yes   Other Topics Concern  . Not on file   Social History Narrative  . No narrative on file     I have personally reviewed and have noted: 1. The patient's medical and social history 2. Their use of alcohol, tobacco or illicit drugs 3. Their current medications and supplements 4. The patient's functional ability including ADL's, fall risks, home safety risks and hearing or visual impairment. 5. Diet and physical activities 6. Evidence for depression or mood disorders  Subjective:   Review of Systems:   Constitutional: Denies fever, malaise, fatigue, headache or abrupt weight changes.  HEENT: Denies eye pain, eye redness, ear pain, ringing in the ears, wax buildup, runny nose, nasal congestion, bloody nose, or sore throat. Respiratory: Denies difficulty breathing, shortness of breath, cough or sputum production.   Cardiovascular: Denies chest pain, chest tightness, palpitations or swelling in the hands or feet.  Gastrointestinal: Denies abdominal pain, bloating, constipation, diarrhea or blood in the stool.  GU: Denies urgency, frequency, pain with urination, burning sensation, blood in urine, odor or discharge. Musculoskeletal: Denies decrease in range of motion, difficulty with gait, muscle pain or joint pain and swelling.  Skin: Denies redness, rashes, lesions or ulcercations.  Neurological: Denies dizziness, difficulty with memory, difficulty with speech or problems with balance and coordination.   No other specific complaints in a complete review of systems (except as listed in HPI above).  Objective:  PE:   BP 126/98  Pulse 112  Temp(Src) 98.4 F (36.9 C) (Oral)  Ht 5\' 6"  (1.676 m)  Wt 206 lb (93.441 kg)  BMI 33.27 kg/m2  SpO2 95% Wt Readings from Last 3 Encounters:  08/14/12 206 lb (93.441 kg)  07/10/12 210 lb (95.255 kg)  06/16/12 210 lb (95.255 kg)    General: Appears their stated age, well developed, well nourished in NAD. Skin: Warm, dry and intact. No rashes, lesions or ulcerations noted. HEENT: Head: normal shape and size; Eyes: sclera white, no  icterus, conjunctiva pink, PERRLA and EOMs intact; Ears: Tm's gray and intact, normal light reflex; Nose: mucosa pink and moist, septum midline; Throat/Mouth: Teeth present, mucosa pink and moist, no exudate, lesions or ulcerations noted.  Neck: Normal range of motion. Neck supple, trachea midline. No massses, lumps or thyromegaly present.  Cardiovascular: Normal rate and rhythm. S1,S2 noted.  No murmur, rubs or gallops noted. No JVD or BLE edema. No carotid bruits noted. Pulmonary/Chest: Normal effort and positive vesicular breath sounds. No respiratory distress. No wheezes, rales or ronchi noted.  Abdomen: Soft and nontender. Normal bowel sounds, no bruits noted. No distention or masses noted. Liver, spleen and kidneys non palpable. GYN: Breast symmetrical, soft, nontender, no masses noted. Uterus absent, adnexa non palpable, no CMTor discharge noted. Musculoskeletal: Normal range  of motion. No signs of joint swelling. No difficulty with gait.   Neurological: Alert and oriented. Cranial nerves II-XII intact. Coordination normal. +DTRs bilaterally. Psychiatric: Mood and affect normal. Behavior is normal. Judgment and thought content normal.   EKG:  BMET    Component Value Date/Time   NA 138 04/02/2012 0949   K 4.7 04/02/2012 0949   CL 104 04/02/2012 0949   CO2 24 04/02/2012 0949   GLUCOSE 96 04/02/2012 0949   BUN 8 04/02/2012 0949   CREATININE 0.7 04/02/2012 0949   CREATININE 0.65 04/28/2010 1346   CALCIUM 9.1 04/02/2012 0949   GFRNONAA >90 09/20/2011 1424   GFRAA >90 09/20/2011 1424    Lipid Panel     Component Value Date/Time   CHOL 158 07/29/2011 0958   TRIG 151.0* 07/29/2011 0958   HDL 26.30* 07/29/2011 0958   CHOLHDL 6 07/29/2011 0958   VLDL 30.2 07/29/2011 0958   LDLCALC 102* 07/29/2011 0958    CBC    Component Value Date/Time   WBC 9.2 04/02/2012 0949   RBC 5.06 04/02/2012 0949   HGB 15.9* 04/02/2012 0949   HCT 46.2* 04/02/2012 0949   PLT 221.0 04/02/2012 0949   MCV 91.3 04/02/2012 0949   MCH 32.1  09/20/2011 1424   MCHC 34.4 04/02/2012 0949   RDW 12.1 04/02/2012 0949   LYMPHSABS 2.7 04/02/2012 0949   MONOABS 1.0 04/02/2012 0949   EOSABS 0.3 04/02/2012 0949   BASOSABS 0.1 04/02/2012 0949    Hgb A1C Lab Results  Component Value Date   HGBA1C 5.8* 04/28/2010      Assessment and Plan:   Medicare Annual Wellness Visit:  Diet: Heart healthy  Physical activity: Sedentary Depression/mood screen: Negative Hearing: Intact to whispered voice Visual acuity: Grossly normal, performs annual eye exam  ADLs: Capable Fall risk: None Home safety: Good Cognitive evaluation: Intact to orientation, naming, recall and repetition EOL planning: Adv directives, full code/ I agree  Preventative Medicine:  Pap smear obtained today Urinalysis-negative likely cystitis Will start nortriptyline for migraines and refill imitrex  Next appointment: 09/2012 with Dr. Posey Rea- have lipids and A1C done prior to this visit

## 2012-08-14 NOTE — Patient Instructions (Signed)

## 2012-08-18 ENCOUNTER — Telehealth: Payer: Self-pay | Admitting: *Deleted

## 2012-08-18 ENCOUNTER — Ambulatory Visit: Payer: Self-pay | Admitting: Pain Medicine

## 2012-08-18 NOTE — Telephone Encounter (Signed)
Angelique Blonder called from [facility] on behalf of Dr Ewing Schlein, Dr Metta Clines is wanting to prescribe Tramadol to pt.  Called requesting ok from PCP first.  Please advise.

## 2012-08-19 NOTE — Telephone Encounter (Signed)
Left detailed massage on MDs office nurse line.

## 2012-08-19 NOTE — Telephone Encounter (Signed)
It is ok w/me Thx!

## 2012-09-02 ENCOUNTER — Ambulatory Visit: Payer: Self-pay | Admitting: Pain Medicine

## 2012-09-03 ENCOUNTER — Encounter: Payer: Self-pay | Admitting: Internal Medicine

## 2012-09-04 ENCOUNTER — Other Ambulatory Visit: Payer: Self-pay | Admitting: Internal Medicine

## 2012-09-04 MED ORDER — DICLOFENAC SODIUM 75 MG PO TBEC: 75.0000 mg | DELAYED_RELEASE_TABLET | Freq: Three times a day (TID) | ORAL | Status: DC | PRN

## 2012-09-07 ENCOUNTER — Telehealth: Payer: Self-pay | Admitting: *Deleted

## 2012-09-07 ENCOUNTER — Ambulatory Visit (INDEPENDENT_AMBULATORY_CARE_PROVIDER_SITE_OTHER): Payer: Medicare Other | Admitting: Internal Medicine

## 2012-09-07 ENCOUNTER — Encounter: Payer: Self-pay | Admitting: Internal Medicine

## 2012-09-07 VITALS — BP 112/62 | HR 64 | Temp 98.5°F | Resp 16 | Wt 204.0 lb

## 2012-09-07 DIAGNOSIS — R5381 Other malaise: Secondary | ICD-10-CM

## 2012-09-07 DIAGNOSIS — M545 Low back pain, unspecified: Secondary | ICD-10-CM

## 2012-09-07 DIAGNOSIS — I10 Essential (primary) hypertension: Secondary | ICD-10-CM

## 2012-09-07 DIAGNOSIS — J45909 Unspecified asthma, uncomplicated: Secondary | ICD-10-CM

## 2012-09-07 DIAGNOSIS — R5383 Other fatigue: Secondary | ICD-10-CM

## 2012-09-07 DIAGNOSIS — G43009 Migraine without aura, not intractable, without status migrainosus: Secondary | ICD-10-CM

## 2012-09-07 DIAGNOSIS — F988 Other specified behavioral and emotional disorders with onset usually occurring in childhood and adolescence: Secondary | ICD-10-CM

## 2012-09-07 MED ORDER — PROMETHAZINE HCL 12.5 MG PO TABS: 12.5000 mg | ORAL_TABLET | Freq: Four times a day (QID) | ORAL | Status: DC | PRN

## 2012-09-07 NOTE — Assessment & Plan Note (Signed)
Continue with current prescription therapy as reflected on the Med list.  

## 2012-09-07 NOTE — Telephone Encounter (Signed)
[name] called from the pharmacy states Diclofenac was written for the pt 1 tab TID, however the usual dosage is 1 tab BID.  Please advise

## 2012-09-07 NOTE — Assessment & Plan Note (Signed)
Per pain clinic Cont w/t loss

## 2012-09-07 NOTE — Assessment & Plan Note (Signed)
Added Diclofenac prn

## 2012-09-07 NOTE — Telephone Encounter (Signed)
Ok to change to 1 po bid prn pain. Same # Thx

## 2012-09-07 NOTE — Patient Instructions (Signed)
Gluten free trial (no wheat products) x4-6 weeks. OK to use Gluten-free bread and pasta. Milk free trial (no milk, ice cream and yogurt) x4 weeks. OK to use almond or soy milk. 

## 2012-09-07 NOTE — Progress Notes (Signed)
Subjective:   F/u worsened anxiety and depression after her son had a bad car accident on mother's day (2nd MVA)  -- much better now. The mood is up. C/o HAs. Trying to loose wt... She [facility] at [facility] - feeling better...  Anxiety Presents for follow-up visit. Symptoms include insomnia and nervous/anxious behavior. Patient reports no confusion, decreased concentration, depressed mood, dizziness, nausea, shortness of breath or suicidal ideas. The severity of symptoms is moderate. The quality of sleep is poor.    Back Pain This is a chronic problem. The current episode started more than 1 month ago (fell at [facility] in Sept 2013). The problem occurs constantly. The problem has been gradually worsening since onset. The pain is present in the lumbar spine. The quality of the pain is described as aching. The pain does not radiate. The pain is at a severity of 8/10. The pain is severe. The pain is worse during the day. The symptoms are aggravated by bending, standing and twisting. Pertinent negatives include no abdominal pain, headaches, numbness or weakness. She has tried analgesics, NSAIDs and muscle relaxant (injections) for the symptoms. The treatment provided mild relief.   Saw an Ortho Dr Floyce Stakes at [facility] - had an MRI - bulging disk; had injections x3 - helped x 1.5 wk  The patient is here to follow up on chronic bipolar depression, anxiety, headaches and chronic moderate fibromyalgia and LBP symptoms partially controlled with medicines, diet and exercise. F/u HTN, asthma. She lost 45 lbs. She is on SS disability for her bipolar  Wt Readings from Last 3 Encounters:  09/07/12 204 lb (92.534 kg)  08/14/12 206 lb (93.441 kg)  07/10/12 210 lb (95.255 kg)   BP Readings from Last 3 Encounters:  09/07/12 112/62  08/14/12 126/98  07/10/12 120/90       Review of Systems  Constitutional: Positive for fatigue. Negative for chills, activity change, appetite change and unexpected  weight change.  HENT: Negative for congestion, mouth sores and sinus pressure.   Eyes: Negative for visual disturbance.  Respiratory: Positive for cough. Negative for chest tightness and shortness of breath.   Gastrointestinal: Negative for nausea and abdominal pain.  Genitourinary: Negative for frequency, difficulty urinating and vaginal pain.  Musculoskeletal: Positive for back pain. Negative for gait problem.  Skin: Negative for pallor and rash.  Neurological: Negative for dizziness, tremors, weakness, numbness and headaches.  Psychiatric/Behavioral: Positive for behavioral problems, sleep disturbance and dysphoric mood. Negative for suicidal ideas, confusion and decreased concentration. The patient is nervous/anxious and has insomnia.        Objective:   Physical Exam  Constitutional: She appears well-developed. No distress.  obese  HENT:  Head: Normocephalic.  Right Ear: External ear normal.  Left Ear: External ear normal.  Nose: Nose normal.  Mouth/Throat: Oropharynx is clear and moist.  Eyes: Conjunctivae are normal. Pupils are equal, round, and reactive to light. Right eye exhibits no discharge. Left eye exhibits no discharge.  Neck: Normal range of motion. Neck supple. No JVD present. No tracheal deviation present. No thyromegaly present.  Cardiovascular: Normal rate, regular rhythm and normal heart sounds.   Pulmonary/Chest: No stridor. No respiratory distress. She has no wheezes.  Abdominal: Soft. Bowel sounds are normal. She exhibits no distension and no mass. There is no tenderness. There is no rebound and no guarding.  Musculoskeletal: She exhibits no edema and no tenderness.  Lymphadenopathy:    She has no cervical adenopathy.  Neurological: She displays tremor.  She displays normal reflexes. No cranial nerve deficit. She exhibits normal muscle tone. Coordination normal.  Skin: No rash noted. No erythema.  Psychiatric: Her behavior is normal. Judgment and thought  content normal. Her mood appears not anxious. Thought content is not paranoid. She does not exhibit a depressed mood. She expresses no homicidal and no suicidal ideation.  Looks much better Restless legs   Lab Results  Component Value Date   WBC 9.2 04/02/2012   HGB 15.9* 04/02/2012   HCT 46.2* 04/02/2012   PLT 221.0 04/02/2012   GLUCOSE 96 04/02/2012   CHOL 158 07/29/2011   TRIG 151.0* 07/29/2011   HDL 26.30* 07/29/2011   LDLDIRECT 111.6 05/24/2010   LDLCALC 102* 07/29/2011   ALT 22 04/02/2012   AST 19 04/02/2012   NA 138 04/02/2012   K 4.7 04/02/2012   CL 104 04/02/2012   CREATININE 0.7 04/02/2012   BUN 8 04/02/2012   CO2 24 04/02/2012   TSH 2.41 04/02/2012   HGBA1C 5.8* 04/28/2010           Assessment & Plan:

## 2012-09-07 NOTE — Assessment & Plan Note (Signed)
Gluten free trial (no wheat products) x4-6 weeks. OK to use Gluten-free bread and pasta. Milk free trial (no milk, ice cream and yogurt) x4 weeks. OK to use almond or soy milk. 

## 2012-09-08 NOTE — Telephone Encounter (Signed)
Spoke with Victorino Dike at [facility].  Advised of MD order.

## 2012-09-29 ENCOUNTER — Ambulatory Visit: Payer: Self-pay | Admitting: Pain Medicine

## 2012-09-29 ENCOUNTER — Other Ambulatory Visit: Payer: Self-pay | Admitting: Internal Medicine

## 2012-09-30 ENCOUNTER — Telehealth: Payer: Self-pay | Admitting: *Deleted

## 2012-09-30 NOTE — Telephone Encounter (Signed)
Rf req for zolpidem 10 mg 2 po qhs prn sleep. # 30. Med was d/c in EMR. Ok to Rf?

## 2012-09-30 NOTE — Telephone Encounter (Signed)
Use clonazepam for sleep Do not refill Zolpidem Thx

## 2012-10-01 NOTE — Telephone Encounter (Signed)
Left detailed mess informing pharmacy and pt informed.

## 2012-10-05 ENCOUNTER — Ambulatory Visit: Payer: Self-pay | Admitting: Pain Medicine

## 2012-10-06 ENCOUNTER — Telehealth: Payer: Self-pay | Admitting: *Deleted

## 2012-10-06 ENCOUNTER — Other Ambulatory Visit: Payer: Self-pay | Admitting: Internal Medicine

## 2012-10-06 NOTE — Telephone Encounter (Signed)
OK to fill this prescription with additional refills x3 Thank you!  

## 2012-10-06 NOTE — Telephone Encounter (Signed)
Refill request for clonazepam 1mg  Last OV 8.11.14 Last filled 7.10.14

## 2012-10-07 MED ORDER — CLONAZEPAM 1 MG PO TABS: 2.0000 mg | ORAL_TABLET | Freq: Three times a day (TID) | ORAL | Status: DC | PRN

## 2012-10-07 NOTE — Telephone Encounter (Signed)
Refill done.  

## 2012-10-11 ENCOUNTER — Other Ambulatory Visit: Payer: Self-pay | Admitting: Internal Medicine

## 2012-10-12 ENCOUNTER — Ambulatory Visit: Payer: Medicare Other | Admitting: Internal Medicine

## 2012-10-22 ENCOUNTER — Other Ambulatory Visit: Payer: Self-pay | Admitting: Internal Medicine

## 2012-10-26 ENCOUNTER — Other Ambulatory Visit: Payer: Self-pay | Admitting: Internal Medicine

## 2012-10-28 ENCOUNTER — Ambulatory Visit: Payer: Self-pay | Admitting: Pain Medicine

## 2012-11-03 ENCOUNTER — Ambulatory Visit: Payer: Self-pay | Admitting: Pain Medicine

## 2012-11-03 ENCOUNTER — Other Ambulatory Visit: Payer: Self-pay | Admitting: Internal Medicine

## 2012-11-05 ENCOUNTER — Other Ambulatory Visit (HOSPITAL_COMMUNITY): Payer: Self-pay | Admitting: *Deleted

## 2012-11-05 ENCOUNTER — Encounter (HOSPITAL_COMMUNITY): Payer: Self-pay | Admitting: Psychiatry

## 2012-11-05 ENCOUNTER — Ambulatory Visit (INDEPENDENT_AMBULATORY_CARE_PROVIDER_SITE_OTHER): Payer: 59 | Admitting: Psychiatry

## 2012-11-05 VITALS — BP 147/91 | HR 127 | Ht 66.0 in | Wt 201.2 lb

## 2012-11-05 DIAGNOSIS — F319 Bipolar disorder, unspecified: Secondary | ICD-10-CM

## 2012-11-05 DIAGNOSIS — F988 Other specified behavioral and emotional disorders with onset usually occurring in childhood and adolescence: Secondary | ICD-10-CM

## 2012-11-05 MED ORDER — LISDEXAMFETAMINE DIMESYLATE 20 MG PO CAPS: 20.0000 mg | ORAL_CAPSULE | ORAL | Status: DC

## 2012-11-05 MED ORDER — DIVALPROEX SODIUM ER 250 MG PO TB24: ORAL_TABLET | ORAL | Status: DC

## 2012-11-05 NOTE — Telephone Encounter (Signed)
Unable to send by fax, called in prescription for Depakote ER 250mg  per Dr Lolly Mustache. Spoke with Victorino Dike at patients preferred [facility].

## 2012-11-05 NOTE — Telephone Encounter (Signed)
Depakote ER 250mg  reordered per Dr. Lolly Mustache. His computer kept printing prescription instead of faxing in request to pharmacy. Resubmitted to pharmacy by fax. Cooper Render, RN
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
## 2012-11-05 NOTE — Addendum Note (Signed)
Addended by: Kathryne Sharper T on: 11/05/2012 10:33 AM   Modules accepted: Orders

## 2012-11-16 ENCOUNTER — Ambulatory Visit: Payer: Self-pay | Admitting: Pain Medicine

## 2012-11-17 ENCOUNTER — Other Ambulatory Visit: Payer: Self-pay | Admitting: Internal Medicine

## 2012-11-17 ENCOUNTER — Encounter: Payer: Self-pay | Admitting: Internal Medicine

## 2012-11-18 ENCOUNTER — Other Ambulatory Visit: Payer: Self-pay | Admitting: *Deleted

## 2012-11-18 MED ORDER — SOLIFENACIN SUCCINATE 10 MG PO TABS: 5.0000 mg | ORAL_TABLET | Freq: Every day | ORAL | Status: DC

## 2012-11-18 MED ORDER — NORTRIPTYLINE HCL 25 MG PO CAPS: ORAL_CAPSULE | ORAL | Status: DC

## 2012-11-18 MED ORDER — TRIAMCINOLONE ACETONIDE 0.1 % EX OINT: 1.0000 "application " | TOPICAL_OINTMENT | Freq: Two times a day (BID) | CUTANEOUS | Status: DC

## 2012-11-23 ENCOUNTER — Telehealth (HOSPITAL_COMMUNITY): Payer: Self-pay

## 2012-11-23 ENCOUNTER — Other Ambulatory Visit (HOSPITAL_COMMUNITY): Payer: Self-pay | Admitting: Psychiatry

## 2012-11-23 NOTE — Telephone Encounter (Signed)
3:30pm 11/23/12 Need to speak with Dr. Lolly Mustache about the medication need talk with you ASAP.Marland KitchenMarguerite Gonzalez

## 2012-11-23 NOTE — Telephone Encounter (Signed)
Patient's phone call.  Patient like to reschedule her appointment because she is very busy.  She is not taking Depakote because it is making her more emotional.  I recommend reschedule an appointment when she has time.  Recommend to call us back if she has any further questions.

## 2012-11-24 ENCOUNTER — Other Ambulatory Visit: Payer: Self-pay | Admitting: Internal Medicine

## 2012-11-24 ENCOUNTER — Other Ambulatory Visit: Payer: Self-pay

## 2012-11-24 NOTE — Telephone Encounter (Signed)
Script for neurotin was faxed to pharmacy.

## 2012-11-26 ENCOUNTER — Ambulatory Visit: Payer: Self-pay | Admitting: Pain Medicine

## 2012-11-26 ENCOUNTER — Ambulatory Visit (HOSPITAL_COMMUNITY): Payer: Self-pay | Admitting: Psychiatry

## 2012-11-27 ENCOUNTER — Encounter: Payer: Self-pay | Admitting: Internal Medicine

## 2012-11-27 ENCOUNTER — Other Ambulatory Visit: Payer: Self-pay | Admitting: Internal Medicine

## 2012-11-27 MED ORDER — NICOTINE 10 MG/ML NA SOLN: NASAL | Status: DC

## 2012-11-30 ENCOUNTER — Ambulatory Visit (HOSPITAL_COMMUNITY): Payer: Self-pay | Admitting: Psychiatry

## 2012-11-30 ENCOUNTER — Ambulatory Visit: Payer: Self-pay | Admitting: Pain Medicine

## 2012-12-02 ENCOUNTER — Telehealth: Payer: Self-pay | Admitting: *Deleted
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
## 2012-12-02 NOTE — Telephone Encounter (Signed)
OK HHN and BP monitor Rx's Thx

## 2012-12-02 NOTE — Telephone Encounter (Signed)
Rx's mailed to pt.    Pt informed

## 2012-12-03 ENCOUNTER — Other Ambulatory Visit: Payer: Self-pay

## 2012-12-03 ENCOUNTER — Ambulatory Visit (INDEPENDENT_AMBULATORY_CARE_PROVIDER_SITE_OTHER): Payer: 59 | Admitting: Psychiatry

## 2012-12-03 ENCOUNTER — Encounter (HOSPITAL_COMMUNITY): Payer: Self-pay | Admitting: Psychiatry

## 2012-12-03 VITALS — BP 135/90 | HR 122 | Wt 199.0 lb

## 2012-12-03 DIAGNOSIS — F319 Bipolar disorder, unspecified: Secondary | ICD-10-CM

## 2012-12-03 DIAGNOSIS — F988 Other specified behavioral and emotional disorders with onset usually occurring in childhood and adolescence: Secondary | ICD-10-CM

## 2012-12-03 MED ORDER — LISDEXAMFETAMINE DIMESYLATE 30 MG PO CAPS: 30.0000 mg | ORAL_CAPSULE | ORAL | Status: DC

## 2012-12-03 MED ORDER — ARIPIPRAZOLE 20 MG PO TABS: ORAL_TABLET | ORAL | Status: DC

## 2012-12-03 MED ORDER — BENZTROPINE MESYLATE 1 MG PO TABS: 1.0000 mg | ORAL_TABLET | Freq: Every day | ORAL | Status: DC

## 2012-12-03 MED ORDER — DIAZEPAM 5 MG PO TABS: 5.0000 mg | ORAL_TABLET | Freq: Three times a day (TID) | ORAL | Status: DC | PRN
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
## 2012-12-09 ENCOUNTER — Encounter: Payer: Self-pay | Admitting: Internal Medicine

## 2012-12-10 ENCOUNTER — Other Ambulatory Visit (HOSPITAL_COMMUNITY): Payer: Self-pay | Admitting: Psychiatry

## 2012-12-10 ENCOUNTER — Telehealth (HOSPITAL_COMMUNITY): Payer: Self-pay | Admitting: *Deleted

## 2012-12-10 NOTE — Telephone Encounter (Signed)
Pt left JX:[id number] new medicine last Friday-Cogentin.Nauseated all the time and vomiting daily.Will it go away after while or should she stop? Wants to talk to MD

## 2012-12-10 NOTE — Telephone Encounter (Signed)
I returned phone call and left a message. 

## 2012-12-11 ENCOUNTER — Encounter: Payer: Self-pay | Admitting: Internal Medicine

## 2012-12-14 ENCOUNTER — Encounter: Payer: Self-pay | Admitting: Internal Medicine

## 2012-12-14 ENCOUNTER — Other Ambulatory Visit (INDEPENDENT_AMBULATORY_CARE_PROVIDER_SITE_OTHER): Payer: Medicare Other

## 2012-12-14 ENCOUNTER — Ambulatory Visit (INDEPENDENT_AMBULATORY_CARE_PROVIDER_SITE_OTHER): Payer: Medicare Other | Admitting: Internal Medicine

## 2012-12-14 VITALS — BP 128/90 | HR 88 | Resp 16 | Wt 201.0 lb

## 2012-12-14 DIAGNOSIS — Z1239 Encounter for other screening for malignant neoplasm of breast: Secondary | ICD-10-CM

## 2012-12-14 DIAGNOSIS — F329 Major depressive disorder, single episode, unspecified: Secondary | ICD-10-CM

## 2012-12-14 DIAGNOSIS — E559 Vitamin D deficiency, unspecified: Secondary | ICD-10-CM

## 2012-12-14 DIAGNOSIS — G4700 Insomnia, unspecified: Secondary | ICD-10-CM

## 2012-12-14 DIAGNOSIS — J45909 Unspecified asthma, uncomplicated: Secondary | ICD-10-CM

## 2012-12-14 DIAGNOSIS — F32A Depression, unspecified: Secondary | ICD-10-CM

## 2012-12-14 DIAGNOSIS — F411 Generalized anxiety disorder: Secondary | ICD-10-CM

## 2012-12-14 DIAGNOSIS — I10 Essential (primary) hypertension: Secondary | ICD-10-CM

## 2012-12-14 DIAGNOSIS — G43009 Migraine without aura, not intractable, without status migrainosus: Secondary | ICD-10-CM

## 2012-12-14 DIAGNOSIS — F3289 Other specified depressive episodes: Secondary | ICD-10-CM

## 2012-12-14 DIAGNOSIS — R202 Paresthesia of skin: Secondary | ICD-10-CM

## 2012-12-14 DIAGNOSIS — M545 Low back pain, unspecified: Secondary | ICD-10-CM

## 2012-12-14 DIAGNOSIS — G47419 Narcolepsy without cataplexy: Secondary | ICD-10-CM

## 2012-12-14 DIAGNOSIS — Z23 Encounter for immunization: Secondary | ICD-10-CM

## 2012-12-14 LAB — BASIC METABOLIC PANEL
BUN: 14 mg/dL (ref 6–23)
CO2: 26 mEq/L (ref 19–32)
Calcium: 9.3 mg/dL (ref 8.4–10.5)
Chloride: 102 mEq/L (ref 96–112)
GFR: 127.4 mL/min (ref >60.00)
Glucose, Bld: 102 mg/dL — ABNORMAL HIGH (ref 70–99)
Sodium: 137 mEq/L (ref 135–145)

## 2012-12-14 LAB — HEPATIC FUNCTION PANEL
AST: 24 U/L (ref 0–37)
Albumin: 4.2 g/dL (ref 3.5–5.2)
Alkaline Phosphatase: 87 U/L (ref 39–117)
Total Protein: 7.6 g/dL (ref 6.0–8.3)

## 2012-12-14 LAB — TSH: TSH: 2.32 u[IU]/mL (ref 0.35–5.50)

## 2012-12-14 MED ORDER — GABAPENTIN 300 MG PO CAPS: ORAL_CAPSULE | ORAL | Status: DC

## 2012-12-14 MED ORDER — FLUCONAZOLE 150 MG PO TABS: 150.0000 mg | ORAL_TABLET | Freq: Once | ORAL | Status: AC

## 2012-12-14 MED ORDER — FLUCONAZOLE 150 MG PO TABS: 150.0000 mg | ORAL_TABLET | Freq: Once | ORAL | Status: DC

## 2012-12-14 NOTE — Assessment & Plan Note (Signed)
Bipolar - chronic. On disability now Dr Lolly Mustache Continue with current prescription therapy as reflected on the Med list.

## 2012-12-14 NOTE — Progress Notes (Signed)
Pre visit review using our clinic review tool, if applicable. No additional management support is needed unless otherwise documented below in the visit note. 

## 2012-12-14 NOTE — Assessment & Plan Note (Signed)
Worse when manic

## 2012-12-14 NOTE — Progress Notes (Signed)
Subjective:   F/u worsened bipolar. She saw Dr Lolly Mustache for a manic episode. The mood is up. C/o HAs. Trying to loose wt... [facility] - feeling better...  Anxiety Presents for follow-up visit. The problem has been gradually improving. Symptoms include insomnia and nervous/anxious behavior. Patient reports no confusion, decreased concentration, depressed mood, dizziness, nausea, shortness of breath or suicidal ideas. The severity of symptoms is moderate. The quality of sleep is poor.   Her past medical history is significant for bipolar disorder.   Saw an Ortho Dr Floyce Stakes at [facility] - had an MRI - bulging disk; had injections x3 - helped x 1.5 wk  The patient is here to follow up on chronic bipolar depression, anxiety, headaches and chronic moderate fibromyalgia and LBP symptoms partially controlled with medicines, diet and exercise. F/u HTN, asthma. She lost 45 lbs. She is on SS disability for her bipolar  Wt Readings from Last 3 Encounters:  12/14/12 201 lb (91.173 kg)  12/03/12 199 lb (90.266 kg)  11/05/12 201 lb 3.2 oz (91.264 kg)   BP Readings from Last 3 Encounters:  12/14/12 128/90  12/03/12 135/90  11/05/12 147/91       Review of Systems  Constitutional: Positive for fatigue. Negative for chills, activity change, appetite change and unexpected weight change.  HENT: Negative for congestion, mouth sores and sinus pressure.   Eyes: Negative for visual disturbance.  Respiratory: Positive for cough. Negative for chest tightness and shortness of breath.   Gastrointestinal: Negative for nausea.  Genitourinary: Negative for frequency, difficulty urinating and vaginal pain.  Musculoskeletal: Negative for gait problem.  Skin: Negative for pallor and rash.  Neurological: Negative for dizziness and tremors.  Psychiatric/Behavioral: Positive for behavioral problems, sleep disturbance and dysphoric mood. Negative for suicidal ideas, confusion  and decreased concentration. The patient is nervous/anxious and has insomnia.        Objective:   Physical Exam  Constitutional: She appears well-developed. No distress.  obese  HENT:  Head: Normocephalic.  Right Ear: External ear normal.  Left Ear: External ear normal.  Nose: Nose normal.  Mouth/Throat: Oropharynx is clear and moist.  Eyes: Conjunctivae are normal. Pupils are equal, round, and reactive to light. Right eye exhibits no discharge. Left eye exhibits no discharge.  Neck: Normal range of motion. Neck supple. No JVD present. No tracheal deviation present. No thyromegaly present.  Cardiovascular: Normal rate, regular rhythm and normal heart sounds.   Pulmonary/Chest: No stridor. No respiratory distress. She has no wheezes.  Abdominal: Soft. Bowel sounds are normal. She exhibits no distension and no mass. There is no tenderness. There is no rebound and no guarding.  Musculoskeletal: She exhibits no edema and no tenderness.  Lymphadenopathy:    She has no cervical adenopathy.  Neurological: She displays tremor. She displays normal reflexes. No cranial nerve deficit. She exhibits normal muscle tone. Coordination normal.  Skin: No rash noted. No erythema.  Psychiatric: Her behavior is normal. Judgment and thought content normal. Her mood appears not anxious. Thought content is not paranoid. She does not exhibit a depressed mood. She expresses no homicidal and no suicidal ideation.  Looks much better Restless legs   Lab Results  Component Value Date   WBC 9.2 04/02/2012   HGB 15.9* 04/02/2012   HCT 46.2* 04/02/2012   PLT 221.0 04/02/2012   GLUCOSE 96 04/02/2012   CHOL 158 07/29/2011   TRIG 151.0* 07/29/2011   HDL 26.30* 07/29/2011  LDLDIRECT 111.6 05/24/2010   LDLCALC 102* 07/29/2011   ALT 22 04/02/2012   AST 19 04/02/2012   NA 138 04/02/2012   K 4.7 04/02/2012   CL 104 04/02/2012   CREATININE 0.7 04/02/2012   BUN 8 04/02/2012   CO2 24 04/02/2012   TSH 2.41 04/02/2012   HGBA1C 5.8* 04/28/2010            Assessment & Plan:

## 2012-12-14 NOTE — Assessment & Plan Note (Signed)
Continue with current prescription therapy as reflected on the Med list.  

## 2012-12-14 NOTE — Assessment & Plan Note (Signed)
Doing well 

## 2012-12-28 ENCOUNTER — Ambulatory Visit: Payer: Self-pay | Admitting: Pain Medicine

## 2012-12-31 ENCOUNTER — Ambulatory Visit (INDEPENDENT_AMBULATORY_CARE_PROVIDER_SITE_OTHER): Payer: Medicare Other | Admitting: Internal Medicine

## 2012-12-31 ENCOUNTER — Encounter: Payer: Self-pay | Admitting: Internal Medicine

## 2012-12-31 VITALS — BP 120/82 | HR 89 | Temp 97.0°F | Ht 66.0 in | Wt 202.4 lb

## 2012-12-31 DIAGNOSIS — J209 Acute bronchitis, unspecified: Secondary | ICD-10-CM | POA: Insufficient documentation

## 2012-12-31 DIAGNOSIS — R079 Chest pain, unspecified: Secondary | ICD-10-CM | POA: Insufficient documentation

## 2012-12-31 DIAGNOSIS — R062 Wheezing: Secondary | ICD-10-CM

## 2012-12-31 DIAGNOSIS — J019 Acute sinusitis, unspecified: Secondary | ICD-10-CM

## 2012-12-31 MED ORDER — HYDROCOD POLST-CHLORPHEN POLST 10-8 MG/5ML PO LQCR: 5.0000 mL | Freq: Two times a day (BID) | ORAL | Status: DC | PRN

## 2012-12-31 MED ORDER — SULFAMETHOXAZOLE-TRIMETHOPRIM 800-160 MG PO TABS: 1.0000 | ORAL_TABLET | Freq: Two times a day (BID) | ORAL | Status: DC

## 2012-12-31 MED ORDER — METHYLPREDNISOLONE ACETATE 80 MG/ML IJ SUSP
80.0000 mg | Freq: Once | INTRAMUSCULAR | Status: AC
  Administered 2012-12-31: 80 mg via INTRAMUSCULAR

## 2012-12-31 MED ORDER — KETOROLAC TROMETHAMINE 30 MG/ML IJ SOLN
30.0000 mg | Freq: Once | INTRAMUSCULAR | Status: AC
  Administered 2012-12-31: 30 mg via INTRAMUSCULAR

## 2012-12-31 MED ORDER — PREDNISONE 10 MG PO TABS: ORAL_TABLET | ORAL | Status: DC

## 2012-12-31 NOTE — Progress Notes (Signed)
Subjective:    Patient ID: [name], female    DOB: [birth year], 42 y.o.   MRN: [id number]  HPI   Here with 2-3 days acute onset fever, facial pain, pressure, headache, general weakness and malaise, and greenish d/c, with mild ST and cough, but pt denies  orthopnea, PND, increased Gonzalez swelling, palpitations, dizziness or syncope, except now also Here with acute onset mild to mod 1-2 days ST, HA, general weakness and malaise, with prod cough greenish sputum, and wheezing but Pt denies chest pain, increased sob or doe, wheezing, orthopnea, PND, increased Gonzalez swelling, palpitations, dizziness or syncope, but has diffuse sinus and chest sharp pains, with wheezing, sob/doe mild.  Has hx of MRSA; now with several small folliculitis like lesions per pt to mons area, no fluctuance or drainage or significant swelling, but hoping an antibiotic today would help.  Using nebs tid, asks for steroid tx as wellwhich has helped before. Past Medical History  Diagnosis Date  . Attention deficit disorder without mention of hyperactivity   . Anxiety state, unspecified   . Pain in joint, site unspecified   . Unspecified asthma(493.90)   . Migraine without aura, without mention of intractable migraine without mention of status migrainosus   . Depressive disorder, not elsewhere classified   . Dysuria   . Family history of diabetes mellitus   . Family history of asthma   . Family history of ischemic heart disease   . Female stress incontinence   . Esophageal reflux   . Unspecified essential hypertension   . Persistent disorder of initiating or maintaining sleep   . Insomnia, unspecified   . Pain in limb   . Tobacco use disorder   . Unspecified vitamin D deficiency   . Abnormal weight gain   . Family history of diabetes mellitus    Past Surgical History  Procedure Laterality Date  . Partial hysterectomy  2007  . Tonsillectomy  04/29/00  . Incise and drain abcess  2011    Right axilla- Dr. Purnell Shoemaker  .  Abdominal ultrasound  10/12/97    reports that she has been smoking Cigarettes.  She has been smoking about 2.00 packs per day. She has never used smokeless tobacco. She reports that she does not drink alcohol or use illicit drugs. family history includes Alcohol abuse in her brother and sister; Asthma in an other family member; Bipolar disorder in her sister; Breast cancer in her mother; Cancer (age of onset: 35) in her mother; Coronary artery disease in an other family member; Depression in her mother; Diabetes in an other family member; Lupus in her sister; Multiple sclerosis in her mother. Allergies  Allergen Reactions  . Amoxicillin     REACTION: QUESTIONABLE  . Doxycycline     REACTION: vomiting  . Hydrochlorothiazide     REACTION: CRAMPS AT HIGHER DOSAGES  . Lisinopril     REACTION: COUGH  . Percocet [Oxycodone-Acetaminophen]     itching  . Talwin [Pentazocine]     n/v   Current Outpatient Prescriptions on File Prior to Visit  Medication Sig Dispense Refill  . albuterol (PROAIR HFA) 108 (90 BASE) MCG/ACT inhaler Inhale 2 puffs into the lungs every 4 (four) hours as needed. For shortness of breath.  1 Inhaler  5  . ARIPiprazole (ABILIFY) 20 MG tablet Take one tablet by mouth one time daily  30 tablet  0  . benztropine (COGENTIN) 1 MG tablet Take 1 tablet (1 mg total) by mouth at bedtime.  30 tablet  0  . diazepam (VALIUM) 5 MG tablet Take 1 tablet (5 mg total) by mouth 3 (three) times daily as needed for anxiety.  90 tablet  0  . diclofenac (VOLTAREN) 75 MG EC tablet TAKE ONE TABLET BY MOUTH TWICE DAILY AS NEEDED FOR PAIN   20 tablet  1  . DULoxetine (CYMBALTA) 30 MG capsule Take two capsules by mouth daily  60 capsule  5  . gabapentin (NEURONTIN) 300 MG capsule TAKE ONE CAPSULE BY MOUTH THREE TIMES DAILY  90 capsule  2  . lisdexamfetamine (VYVANSE) 30 MG capsule Take 1 capsule (30 mg total) by mouth every morning.  30 capsule  0  . Nicotine (NICOTROL NS) 10 MG/ML SOLN As directed  for smoking cessation  10 mL  1  . nortriptyline (PAMELOR) 25 MG capsule take 1 capsule by mouth at bedtime  30 capsule  5  . omeprazole (PRILOSEC) 20 MG capsule Take 1 capsule (20 mg total) by mouth daily.  30 capsule  11  . promethazine (PHENERGAN) 12.5 MG tablet TAKE ONE TABLET BY MOUTH EVERY SIX HOURS AS NEEDED FOR NAUSEA   30 tablet  0  . solifenacin (VESICARE) 10 MG tablet Take 0.5 tablets (5 mg total) by mouth daily.  30 tablet  5  . traMADol (ULTRAM) 50 MG tablet Take 1-2 tablets (50-100 mg total) by mouth 2 (two) times daily as needed for pain.  100 tablet  1  . triamcinolone ointment (KENALOG) 0.1 % Apply 1 application topically 2 (two) times daily.  30 g  3   No current facility-administered medications on file prior to visit.    Review of Systems  Constitutional: Negative for unexpected weight change, or unusual diaphoresis  HENT: Negative for tinnitus.   Eyes: Negative for photophobia and visual disturbance.  Respiratory: Negative for choking and stridor.   Gastrointestinal: Negative for vomiting and blood in stool.  Genitourinary: Negative for hematuria and decreased urine volume.  Musculoskeletal: Negative for acute joint swelling Skin: Negative for color change and wound.  Neurological: Negative for tremors and numbness other than noted  Psychiatric/Behavioral: Negative for decreased concentration or  hyperactivity.       Objective:   Physical Exam BP 120/82  Pulse 89  Temp(Src) 97 F (36.1 C) (Oral)  Ht 5\' 6"  (1.676 m)  Wt 202 lb 6 oz (91.797 kg)  BMI 32.68 kg/m2  SpO2 97% VS noted, mild ill Constitutional: Pt appears well-developed and well-nourished.  HENT: Head: NCAT.  Right Ear: External ear normal.  Left Ear: External ear normal.  Bilat tm's with mild erythema.  Max sinus areas mild tender.  Pharynx with mild erythema, no exudate Eyes: Conjunctivae and EOM are normal. Pupils are equal, round, and reactive to light.  Neck: Normal range of motion. Neck  supple.  Cardiovascular: Normal rate and regular rhythm.   Pulmonary/Chest: Effort normal and breath sounds decreased with few bilat wheezes.  Abd:  Soft, NT, non-distended, + BS Neurological: Pt is alert. Not confused  Skin: pelvic exam deferred Psychiatric: Pt behavior is normal. Thought content normal. 1+ tearful    Assessment & Plan:

## 2012-12-31 NOTE — Patient Instructions (Signed)
You had the steroid and pain shot today Please take all new medication as prescribed - the antibiotic, cough medicine, and prednisone Please continue all other medications as before, including the inhaler Please have the pharmacy call with any other refills you may need.  Please remember to sign up for My Chart if you have not done so, as this will be important to you in the future with finding out test results, communicating by private email, and scheduling acute appointments online when needed.

## 2012-12-31 NOTE — Assessment & Plan Note (Signed)
Mild to mod, for depomedrol IM, predpack for home,  to f/u any worsening symptoms or concerns

## 2012-12-31 NOTE — Assessment & Plan Note (Signed)
Mild to mod, for antibx course,  to f/u any worsening symptoms or concerns, also for cough med

## 2012-12-31 NOTE — Progress Notes (Signed)
Pre-visit discussion using our clinic review tool. No additional management support is needed unless otherwise documented below in the visit note.  

## 2012-12-31 NOTE — Assessment & Plan Note (Signed)
C/w msk - for toradol IM,  to f/u any worsening symptoms or concerns

## 2012-12-31 NOTE — Assessment & Plan Note (Signed)
Mild to mod, for antibx course,  to f/u any worsening symptoms or concerns 

## 2013-01-05 ENCOUNTER — Encounter (HOSPITAL_COMMUNITY): Payer: Self-pay | Admitting: Psychiatry

## 2013-01-05 ENCOUNTER — Ambulatory Visit (INDEPENDENT_AMBULATORY_CARE_PROVIDER_SITE_OTHER): Payer: 59 | Admitting: Psychiatry

## 2013-01-05 VITALS — BP 114/88 | HR 120 | Ht 66.0 in | Wt 199.0 lb

## 2013-01-05 DIAGNOSIS — F319 Bipolar disorder, unspecified: Secondary | ICD-10-CM

## 2013-01-05 DIAGNOSIS — F988 Other specified behavioral and emotional disorders with onset usually occurring in childhood and adolescence: Secondary | ICD-10-CM

## 2013-01-05 MED ORDER — DIAZEPAM 5 MG PO TABS: 5.0000 mg | ORAL_TABLET | Freq: Three times a day (TID) | ORAL | Status: DC | PRN

## 2013-01-05 MED ORDER — BENZTROPINE MESYLATE 1 MG PO TABS: 1.0000 mg | ORAL_TABLET | Freq: Every day | ORAL | Status: DC

## 2013-01-05 MED ORDER — ARIPIPRAZOLE 20 MG PO TABS: ORAL_TABLET | ORAL | Status: DC

## 2013-01-05 MED ORDER — HYDROXYZINE PAMOATE 50 MG PO CAPS: 50.0000 mg | ORAL_CAPSULE | Freq: Every day | ORAL | Status: DC

## 2013-01-05 MED ORDER — LISDEXAMFETAMINE DIMESYLATE 30 MG PO CAPS: 30.0000 mg | ORAL_CAPSULE | ORAL | Status: DC
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
## 2013-01-11 ENCOUNTER — Ambulatory Visit: Payer: Self-pay | Admitting: Pain Medicine

## 2013-01-19 ENCOUNTER — Encounter: Payer: Self-pay | Admitting: Cardiovascular Disease

## 2013-01-19 ENCOUNTER — Encounter: Payer: Self-pay | Admitting: *Deleted

## 2013-01-20 ENCOUNTER — Ambulatory Visit: Payer: Self-pay | Admitting: Cardiovascular Disease

## 2013-01-26 ENCOUNTER — Ambulatory Visit: Payer: Self-pay

## 2013-01-29 ENCOUNTER — Ambulatory Visit
Admission: RE | Admit: 2013-01-29 | Discharge: 2013-01-29 | Disposition: A | Discharge status: Home / Self Care | Payer: Medicare Other | Source: Ambulatory Visit | Attending: Internal Medicine | Admitting: Internal Medicine

## 2013-02-03 ENCOUNTER — Encounter (HOSPITAL_COMMUNITY): Payer: Self-pay | Admitting: Psychiatry

## 2013-02-03 ENCOUNTER — Ambulatory Visit (INDEPENDENT_AMBULATORY_CARE_PROVIDER_SITE_OTHER): Payer: Self-pay | Admitting: Psychiatry

## 2013-02-03 ENCOUNTER — Ambulatory Visit: Payer: Self-pay | Admitting: Pain Medicine

## 2013-02-03 VITALS — BP 141/99 | HR 113 | Ht 66.0 in | Wt 199.4 lb

## 2013-02-03 DIAGNOSIS — F319 Bipolar disorder, unspecified: Secondary | ICD-10-CM

## 2013-02-03 DIAGNOSIS — F988 Other specified behavioral and emotional disorders with onset usually occurring in childhood and adolescence: Secondary | ICD-10-CM

## 2013-02-03 MED ORDER — HYDROXYZINE PAMOATE 50 MG PO CAPS: 50.0000 mg | ORAL_CAPSULE | Freq: Every day | ORAL | Status: DC

## 2013-02-03 MED ORDER — BENZTROPINE MESYLATE 1 MG PO TABS: 1.0000 mg | ORAL_TABLET | Freq: Every day | ORAL | Status: DC

## 2013-02-03 MED ORDER — LISDEXAMFETAMINE DIMESYLATE 30 MG PO CAPS: 30.0000 mg | ORAL_CAPSULE | ORAL | Status: DC

## 2013-02-03 MED ORDER — DIAZEPAM 5 MG PO TABS: 5.0000 mg | ORAL_TABLET | Freq: Three times a day (TID) | ORAL | Status: DC | PRN

## 2013-02-03 MED ORDER — ARIPIPRAZOLE 20 MG PO TABS: ORAL_TABLET | ORAL | Status: DC
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
## 2013-02-09 ENCOUNTER — Emergency Department: Payer: Self-pay | Admitting: Emergency Medicine

## 2013-02-09 ENCOUNTER — Other Ambulatory Visit (HOSPITAL_COMMUNITY): Payer: Self-pay | Admitting: Psychiatry

## 2013-02-09 ENCOUNTER — Ambulatory Visit (INDEPENDENT_AMBULATORY_CARE_PROVIDER_SITE_OTHER): Payer: Medicare Other | Admitting: Cardiovascular Disease

## 2013-02-09 VITALS — BP 140/90 | HR 137 | Resp 20 | Ht 65.0 in | Wt 200.0 lb

## 2013-02-09 DIAGNOSIS — R079 Chest pain, unspecified: Secondary | ICD-10-CM

## 2013-02-09 DIAGNOSIS — R000 Tachycardia, unspecified: Secondary | ICD-10-CM

## 2013-02-09 DIAGNOSIS — I10 Essential (primary) hypertension: Secondary | ICD-10-CM

## 2013-02-09 LAB — BASIC METABOLIC PANEL
Anion Gap: 3 — ABNORMAL LOW (ref 7–16)
BUN: 10 mg/dL (ref 7–18)
CHLORIDE: 106 mmol/L (ref 98–107)
CO2: 29 mmol/L (ref 21–32)
Calcium, Total: 9 mg/dL (ref 8.5–10.1)
Creatinine: 0.64 mg/dL (ref 0.60–1.30)
EGFR (Non-African Amer.): >60
Glucose: 89 mg/dL (ref 65–99)
OSMOLALITY: 274 (ref 275–301)
POTASSIUM: 4.2 mmol/L (ref 3.5–5.1)
Sodium: 138 mmol/L (ref 136–145)

## 2013-02-09 LAB — CBC
HCT: 44.5 % (ref 35.0–47.0)
HGB: 15.4 g/dL (ref 12.0–16.0)
MCH: 31.1 pg (ref 26.0–34.0)
MCHC: 34.5 g/dL (ref 32.0–36.0)
MCV: 90 fL (ref 80–100)
PLATELETS: 220 10*3/uL (ref 150–440)
RBC: 4.94 10*6/uL (ref 3.80–5.20)
RDW: 12.1 % (ref 11.5–14.5)
WBC: 12.9 10*3/uL — AB (ref 3.6–11.0)

## 2013-02-09 MED ORDER — METOPROLOL TARTRATE 25 MG PO TABS: 25.0000 mg | ORAL_TABLET | Freq: Two times a day (BID) | ORAL | Status: DC

## 2013-02-09 NOTE — Patient Instructions (Signed)
Your physician recommends that you schedule a follow-up appointment in: 1 month with Dr Timmie Foerster  START METOPROLOL TARTRATE 25 [place] physician has requested that you have an echocardiogram. Echocardiography is a painless test that uses sound waves to create images of your heart. It provides your doctor with information about the size and shape of your heart and how well your heart's chambers and valves are working. This procedure takes approximately one hour. There are no restrictions for this procedure.

## 2013-02-12 ENCOUNTER — Encounter: Payer: Self-pay | Admitting: Cardiovascular Disease

## 2013-02-12 NOTE — Progress Notes (Signed)
Patient ID: [name], female   DOB: [birth year], 43 y.o.   MRN: [id number]     Reason for office visit Shortness of breath, tachycardia  Not seen since 2011. She complains of shortness of breath and chest pressure that occurred both at rest and with activity a. She reports blood pressure as high as 193/110 and heart rate in the 120s. She has reportedly had recent thyroid tests, which were normal. She takes numerous psychoactive medications with potentially important interaction such as amphetamines for adult attention deficit disorder and nortriptyline for migraine as well as Abilify, Cogentin and Cymbalta and Neurontin. She has bipolar disorder. She also has chronic pain related to degenerative disc disease in her back. She occasionally uses albuterol for asthma.   she is very worried that her symptoms are potentially a sign of inherited cardiac problems. Her father had recurrent strokes and cardiomegaly with heart failure and died at age 13. Both her grand daughter's died in their 63s from heart disease. She has a sister with poorly defined heart disease as well. When last seen in 2011 she was taking Toprol-XL 200 mg every day. She's not taking any beta blocker medications at this time. Not sure why they were stopped.   Allergies  Allergen Reactions  . Amoxicillin     REACTION: QUESTIONABLE  . Doxycycline     REACTION: vomiting  . Hydrochlorothiazide     REACTION: CRAMPS AT HIGHER DOSAGES  . Lisinopril     REACTION: COUGH  . Penicillins   . Percocet [Oxycodone-Acetaminophen]     itching  . Talwin [Pentazocine]     n/v    Current Outpatient Prescriptions  Medication Sig Dispense Refill  . albuterol (PROAIR HFA) 108 (90 BASE) MCG/ACT inhaler Inhale 2 puffs into the lungs every 4 (four) hours as needed. For shortness of breath.  1 Inhaler  5  . ARIPiprazole (ABILIFY) 20 MG tablet Take one tablet by mouth one time daily  30 tablet  1  . benztropine (COGENTIN) 1 MG tablet Take 1  tablet (1 mg total) by mouth at bedtime.  30 tablet  1  . chlorpheniramine-HYDROcodone (TUSSIONEX PENNKINETIC ER) 10-8 MG/5ML LQCR Take 5 mLs by mouth every 12 (twelve) hours as needed for cough.  115 mL  0  . diazepam (VALIUM) 5 MG tablet Take 1 tablet (5 mg total) by mouth 3 (three) times daily as needed for anxiety.  90 tablet  1  . diclofenac (VOLTAREN) 75 MG EC tablet TAKE ONE TABLET BY MOUTH TWICE DAILY AS NEEDED FOR PAIN   20 tablet  1  . DULoxetine (CYMBALTA) 30 MG capsule Take two capsules (60mg ) by mouth daily      . gabapentin (NEURONTIN) 300 MG capsule TAKE ONE CAPSULE BY MOUTH THREE TIMES DAILY  90 capsule  2  . hydrOXYzine (VISTARIL) 50 MG capsule Take 1 capsule (50 mg total) by mouth at bedtime.  30 capsule  1  . lisdexamfetamine (VYVANSE) 30 MG capsule Take 1 capsule (30 mg total) by mouth every morning.  30 capsule  0  . nortriptyline (PAMELOR) 25 MG capsule take 1 capsule by mouth at bedtime  30 capsule  5  . omeprazole (PRILOSEC) 20 MG capsule Take 1 capsule (20 mg total) by mouth daily.  30 capsule  11  . promethazine (PHENERGAN) 12.5 MG tablet TAKE ONE TABLET BY MOUTH EVERY SIX HOURS AS NEEDED FOR NAUSEA   30 tablet  0  . solifenacin (VESICARE) 10 MG tablet Take 0.5 tablets (  5 mg total) by mouth daily.  30 tablet  5  . traMADol (ULTRAM) 50 MG tablet Take 1-2 tablets (50-100 mg total) by mouth 2 (two) times daily as needed for pain.  100 tablet  1  . triamcinolone ointment (KENALOG) 0.1 % Apply 1 application topically 2 (two) times daily.  30 g  3  . metoprolol tartrate (LOPRESSOR) 25 MG tablet Take 1 tablet (25 mg total) by mouth 2 (two) times daily.  180 tablet  3   No current facility-administered medications for this visit.    Past Medical History  Diagnosis Date  . Attention deficit disorder without mention of hyperactivity   . Anxiety state, unspecified   . Pain in joint, site unspecified   . Unspecified asthma(493.90)   . Migraine without aura, without mention of  intractable migraine without mention of status migrainosus   . Depressive disorder, not elsewhere classified   . Dysuria   . Family history of diabetes mellitus   . Family history of asthma   . Family history of ischemic heart disease     fam h/o premature cardiac disease   . Female stress incontinence   . Esophageal reflux   . Unspecified essential hypertension   . Persistent disorder of initiating or maintaining sleep   . Insomnia, unspecified   . Pain in limb   . Tobacco use disorder   . Unspecified vitamin D deficiency   . Abnormal weight gain   . Dyslipidemia     hypertriglyceridemia  . Hypertension     Past Surgical History  Procedure Laterality Date  . Partial hysterectomy  2007  . Tonsillectomy  04/29/00  . Incise and drain abcess  2011    Right axilla- Dr. Barkley Bruns  . Abdominal ultrasound  10/12/97  . Plantar fascia surgery  2000  . Transthoracic echocardiogram  11/2009    EF=>55%; trace MR & TR  . Nm myocar perf wall motion  11/2009    bruce myoview; perfusion defect in anterior region consistent with breast attenuation; remaining myocardium with normal perfusion; post-stress EF 74%; low risk scan     Family History  Problem Relation Age of Onset  . Asthma      Family history  . Coronary artery disease      1st degree female < 50  . Diabetes      1st degree relative  . Breast cancer Mother   . Multiple sclerosis Mother   . Cancer Mother 98    breast ca  . Depression Mother   . Lupus Sister     PTSD  . Alcohol abuse Sister   . Bipolar disorder Sister   . Alcohol abuse Brother     also HTN, lupus, enlarged heart  . Heart disease Father     cardiac arrest  . Heart disease Maternal Grandfather     cardiac arrest  . COPD Paternal Grandmother   . Diabetes Paternal Grandfather   . Heart disease Paternal Grandfather   . ADD / ADHD Child   . Asthma Child     History   Social History  . Marital Status: Married    Spouse Name: N/A    Number of  Children: 2  . Years of Education: N/A   Occupational History  . 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 Smoking status: Current Every Day Smoker -- 2.00 packs/day    Types:  Cigarettes  . Smokeless tobacco: Never Used  . Alcohol Use: No  . Drug Use: No  . Sexual Activity: Yes   Other Topics Concern  . Not on file   Social History Narrative  . No narrative on file    Review of systems: Most of her complaints center around chronic back pain and anxiety, what concerns her more is shortness of breath with moderate activity and occasional atypical chest discomfort. The patient specifically denies orthopnea, paroxysmal nocturnal dyspnea, syncope, palpitations, focal neurological deficits, intermittent claudication, lower extremity edema, unexplained weight gain, cough, hemoptysis. She has rare wheezing.  The patient also denies abdominal pain, nausea, vomiting, dysphagia, diarrhea, constipation, polyuria, polydipsia, dysuria, hematuria, frequency, urgency, abnormal bleeding or bruising, fever, chills, unexpected weight changes, mood swings, change in skin or hair texture, change in voice quality, auditory or visual problems, allergic reactions or rashes, new musculoskeletal complaints other than usual "aches and pains".   PHYSICAL EXAM BP 140/90  Pulse 137  Resp 20  Ht 5\' 5"  (1.651 m)  Wt 90.719 kg (200 lb)  BMI 33.28 kg/m2  General: Alert, oriented x3, no distress. She is constantly fidgeting and slightly tremulous. Her face is a little flushed Head: no evidence of trauma, PERRL, EOMI, no exophtalmos or lid lag, no myxedema, no xanthelasma; normal ears, nose and oropharynx Neck: normal jugular venous pulsations and no hepatojugular reflux; brisk carotid pulses without delay and no carotid bruits Chest: clear to auscultation, no signs of consolidation by percussion or palpation, normal fremitus, symmetrical  and full respiratory excursions Cardiovascular: normal position and quality of the apical impulse, regular rhythm, normal first and second heart sounds, no murmurs, rubs or gallops Abdomen: no tenderness or distention, no masses by palpation, no abnormal pulsatility or arterial bruits, normal bowel sounds, no hepatosplenomegaly Extremities: no clubbing, cyanosis or edema; 2+ radial, ulnar and brachial pulses bilaterally; 2+ right femoral, posterior tibial and dorsalis pedis pulses; 2+ left femoral, posterior tibial and dorsalis pedis pulses; no subclavian or femoral bruits Neurological: grossly nonfocal   EKG: Sinus tachycardia vertical QRS axis almost meeting criteria for left posterior fascicular block,  T wave inversion in leads 3 and aVF similar to the electrocardiogram from 2011  Lipid Panel     Component Value Date/Time   CHOL 158 07/29/2011 0958   TRIG 151.0* 07/29/2011 0958   HDL 26.30* 07/29/2011 0958   CHOLHDL 6 07/29/2011 0958   VLDL 30.2 07/29/2011 0958   LDLCALC 102* 07/29/2011 0958    BMET    Component Value Date/Time   NA 137 12/14/2012 0849   K 3.9 12/14/2012 0849   CL 102 12/14/2012 0849   CO2 26 12/14/2012 0849   GLUCOSE 102* 12/14/2012 0849   BUN 14 12/14/2012 0849   CREATININE 0.6 12/14/2012 0849   CREATININE 0.65 04/28/2010 1346   CALCIUM 9.3 12/14/2012 0849   GFRNONAA >90 09/20/2011 1424   GFRAA >90 09/20/2011 1424     ASSESSMENT AND PLAN  I suspect that her sinus tachycardia reflects physiological response to stimuli outside the cardiovascular system. We have performed previous workup for pheochromocytoma and this was negative. Many of her medications could cause substantial increase in heart rate. She is clinically hyperadrenergic.  He had a normal nuclear stress test in 2011. Because of her new complaints of shortness of breath it is not unreasonable to perform an echocardiogram, but left ventricular ejection fraction was normal in 2011 and there was no evidence of  structural heart disease at that time.  Have  recommended that she start metoprolol 25 mg twice a day, but have also asked her to discuss her current combination of medications with her psychiatrist and pain management physician. It is very likely that the amphetamine is contributing to her tachycardia, we discussed the fact that nortriptyline and amphetamines have potentially serious interactions but she takes them roughly 12 hours apart and states that she needs that nortriptyline for her migraines.  Patient Instructions  Your physician recommends that you schedule a follow-up appointment in: 1 month with Dr Timmie Foerster  START METOPROLOL TARTRATE 25 [place] physician has requested that you have an echocardiogram. Echocardiography is a painless test that uses sound waves to create images of your heart. It provides your doctor with information about the size and shape of your heart and how well your heart's chambers and valves are working. This procedure takes approximately one hour. There are no restrictions for this procedure.       Orders Placed This Encounter  Procedures  . EKG 12-Lead  . 2D Echocardiogram without contrast   Meds ordered this encounter  Medications  . DISCONTD: metoprolol tartrate (LOPRESSOR) 25 MG tablet    Sig: Take 1 tablet (25 mg total) by mouth 2 (two) times daily.    Dispense:  180 tablet    Refill:  3  . metoprolol tartrate (LOPRESSOR) 25 MG tablet    Sig: Take 1 tablet (25 mg total) by mouth 2 (two) times daily.    Dispense:  180 tablet    Refill:  [place] Kanijah Groseclose, MD, [facility] [phone] office [phone] pager

## 2013-02-15 ENCOUNTER — Ambulatory Visit: Payer: Self-pay | Admitting: Pain Medicine

## 2013-02-16 ENCOUNTER — Ambulatory Visit (HOSPITAL_COMMUNITY): Payer: Self-pay

## 2013-02-18 ENCOUNTER — Ambulatory Visit (HOSPITAL_COMMUNITY)
Admission: RE | Admit: 2013-02-18 | Discharge: 2013-02-18 | Disposition: A | Discharge status: Home / Self Care | Payer: Medicare Other | Source: Ambulatory Visit | Attending: Internal Medicine | Admitting: Internal Medicine

## 2013-02-18 ENCOUNTER — Other Ambulatory Visit (INDEPENDENT_AMBULATORY_CARE_PROVIDER_SITE_OTHER): Payer: Medicare Other

## 2013-02-18 DIAGNOSIS — R000 Tachycardia, unspecified: Secondary | ICD-10-CM

## 2013-02-18 DIAGNOSIS — Z131 Encounter for screening for diabetes mellitus: Secondary | ICD-10-CM

## 2013-02-18 DIAGNOSIS — E785 Hyperlipidemia, unspecified: Secondary | ICD-10-CM

## 2013-02-18 DIAGNOSIS — R0989 Other specified symptoms and signs involving the circulatory and respiratory systems: Principal | ICD-10-CM | POA: Insufficient documentation

## 2013-02-18 DIAGNOSIS — R079 Chest pain, unspecified: Secondary | ICD-10-CM | POA: Insufficient documentation

## 2013-02-18 DIAGNOSIS — I10 Essential (primary) hypertension: Secondary | ICD-10-CM | POA: Insufficient documentation

## 2013-02-18 DIAGNOSIS — R0609 Other forms of dyspnea: Secondary | ICD-10-CM | POA: Insufficient documentation

## 2013-02-18 LAB — LIPID PANEL
CHOL/HDL RATIO: 6
Cholesterol: 217 mg/dL — ABNORMAL HIGH (ref 0–200)
HDL: 37.6 mg/dL — ABNORMAL LOW (ref >39.00)
TRIGLYCERIDES: 202 mg/dL — AB (ref 0.0–149.0)
VLDL: 40.4 mg/dL — ABNORMAL HIGH (ref 0.0–40.0)

## 2013-02-18 LAB — HEMOGLOBIN A1C: HEMOGLOBIN A1C: 5.6 % (ref 4.6–6.5)

## 2013-02-18 LAB — LDL CHOLESTEROL, DIRECT: Direct LDL: 161.1 mg/dL

## 2013-02-18 NOTE — Progress Notes (Signed)
2D Echo Performed 02/18/2013    [name], RCS

## 2013-02-25 ENCOUNTER — Ambulatory Visit: Payer: Self-pay | Admitting: Pain Medicine

## 2013-03-01 ENCOUNTER — Ambulatory Visit: Payer: Self-pay | Admitting: Pain Medicine

## 2013-03-11 ENCOUNTER — Encounter (HOSPITAL_COMMUNITY): Payer: Self-pay | Admitting: *Deleted

## 2013-03-11 NOTE — Progress Notes (Signed)
Hydroxyzine 50 mg authorized by [employer] from 03/14/13 thru 03/11/14 PA# [id number] Cogentin 1 mg authorized by [employer] [facility] from 03/14/13 thru 03/11/14 PA# [id number] Patient and pharmacy notified by phone

## 2013-03-15 ENCOUNTER — Other Ambulatory Visit: Payer: Self-pay | Admitting: Internal Medicine

## 2013-03-16 ENCOUNTER — Ambulatory Visit: Payer: Self-pay | Admitting: Internal Medicine

## 2013-03-18 MED ORDER — PROMETHAZINE HCL 12.5 MG PO TABS: ORAL_TABLET | ORAL | Status: DC

## 2013-03-23 ENCOUNTER — Telehealth: Payer: Self-pay | Admitting: Internal Medicine

## 2013-03-23 ENCOUNTER — Encounter: Payer: Self-pay | Admitting: Neurology

## 2013-03-24 ENCOUNTER — Ambulatory Visit: Payer: Medicare Other | Admitting: Cardiovascular Disease

## 2013-03-24 ENCOUNTER — Ambulatory Visit: Payer: Self-pay | Admitting: Internal Medicine

## 2013-03-24 ENCOUNTER — Encounter: Payer: Self-pay | Admitting: Neurology

## 2013-03-24 ENCOUNTER — Ambulatory Visit (INDEPENDENT_AMBULATORY_CARE_PROVIDER_SITE_OTHER): Payer: Medicare Other | Admitting: Neurology

## 2013-03-24 VITALS — BP 144/96 | HR 120 | Ht 65.5 in | Wt 196.0 lb

## 2013-03-24 DIAGNOSIS — G43009 Migraine without aura, not intractable, without status migrainosus: Secondary | ICD-10-CM

## 2013-03-24 MED ORDER — RIZATRIPTAN BENZOATE 10 MG PO TBDP: 10.0000 mg | ORAL_TABLET | ORAL | Status: DC | PRN

## 2013-03-24 MED ORDER — HYDROCOD POLST-CHLORPHEN POLST 10-8 MG/5ML PO LQCR: 5.0000 mL | Freq: Two times a day (BID) | ORAL | Status: DC | PRN

## 2013-03-24 MED ORDER — NORTRIPTYLINE HCL 25 MG PO CAPS: 50.0000 mg | ORAL_CAPSULE | Freq: Every day | ORAL | Status: DC

## 2013-03-24 NOTE — Telephone Encounter (Signed)
Called the patient to inform to pickup hardcopy at the front desk.  Realized unable to fax.  Informed the patient as well would need to complete the controlled medication contract and do a UDS as well.  Chartered loss adjuster and is attached to hardcopy at the front in [place]net

## 2013-03-24 NOTE — Telephone Encounter (Signed)
Done hardcopy to robin  

## 2013-03-24 NOTE — Telephone Encounter (Signed)
Faxed hardcopy to [place]

## 2013-03-24 NOTE — Progress Notes (Signed)
PATIENT: [name] DOB: [birth year]  HISTORICAL  [name]  43  years old  right-handed Caucasian female, referred to her optometrist Dr. Truman Hayward for evaluation of frequent headaches  She had a past medical history of bipolar disorder, is on polypharmacy treatment, was treated with lithium in the past, she has now taking Abilify 20 mg once daily, Cogentin 1 mg at bedtime, Cymbalta 30 mg 2 tablets daily, Valium 5 mg 3 times a day as needed, also on gabapentin 300 mg 3 times a day, hydroxyzine 50 mg at bedtime, by dates 30 mg every morning, metoprolol 25 mg twice a day, tramadol,    She was evaluated by Dr. Marin Comment in February 18 2013, for her yearly eye evaluation, normal ocular examination, intraocular pressure 16 mm mercury in the right eye, 17 in the left eye, healthy discs, normal visual field testing, she reported frequent headaches, was referred to our clinic for evaluation  She reported migraine all her life, getting worse since 2012, bilateral retro-orbital area severe pounding headaches was associated light noise sensitivity, nauseous, lasting for hours to 3 days,  She was given nortriptyline 20 mg every night as migraine prevention, which has helped her 50%, she reported less headaches, less severe, it also helps her sleep, no significant side effect,  She tried Imitrex in the past, does not help her headaches, she is taking diclofenac, and the phenergan as needed for her headaches  Trigger for her headache, stress, sleep deprivations she is having headaches one to twice a month  REVIEW OF SYSTEMS: Full 14 system review of systems performed and notable only for depression, anxiety, decreased energy, racing thoughts, insomnia, headaches, restless leg, fatigue, chest pain, joint pain, constipations.  ALLERGIES: Allergies  Allergen Reactions  . Amoxicillin     REACTION: QUESTIONABLE  . Doxycycline     REACTION: vomiting  . Hydrochlorothiazide     REACTION: CRAMPS AT HIGHER  DOSAGES  . Lisinopril     REACTION: COUGH  . Penicillins   . Percocet [Oxycodone-Acetaminophen]     itching  . Talwin [Pentazocine]     n/v    HOME MEDICATIONS: Current Outpatient Prescriptions on File Prior to Visit  Medication Sig Dispense Refill  . albuterol (PROAIR HFA) 108 (90 BASE) MCG/ACT inhaler Inhale 2 puffs into the lungs every 4 (four) hours as needed. For shortness of breath.  1 Inhaler  5  . ARIPiprazole (ABILIFY) 20 MG tablet Take one tablet by mouth one time daily  30 tablet  1  . benztropine (COGENTIN) 1 MG tablet Take 1 tablet (1 mg total) by mouth at bedtime.  30 tablet  1  . diazepam (VALIUM) 5 MG tablet Take 1 tablet (5 mg total) by mouth 3 (three) times daily as needed for anxiety.  90 tablet  1  . diclofenac (VOLTAREN) 75 MG EC tablet TAKE ONE TABLET BY MOUTH TWICE DAILY AS NEEDED FOR PAIN   20 tablet  1  . DULoxetine (CYMBALTA) 30 MG capsule Take two capsules (60mg ) by mouth daily      . gabapentin (NEURONTIN) 300 MG capsule TAKE ONE CAPSULE BY MOUTH THREE TIMES DAILY  90 capsule  2  . hydrOXYzine (VISTARIL) 50 MG capsule Take 1 capsule (50 mg total) by mouth at bedtime.  30 capsule  1  . lisdexamfetamine (VYVANSE) 30 MG capsule Take 1 capsule (30 mg total) by mouth every morning.  30 capsule  0  . metoprolol tartrate (LOPRESSOR) 25 MG tablet Take 1  tablet (25 mg total) by mouth 2 (two) times daily.  180 tablet  3  . nortriptyline (PAMELOR) 25 MG capsule take 1 capsule by mouth at bedtime  30 capsule  5  . omeprazole (PRILOSEC) 20 MG capsule Take 1 capsule (20 mg total) by mouth daily.  30 capsule  11  . promethazine (PHENERGAN) 12.5 MG tablet TAKE ONE TABLET BY MOUTH EVERY SIX HOURS AS NEEDED FOR NAUSEA  30 tablet  0  . solifenacin (VESICARE) 10 MG tablet Take 0.5 tablets (5 mg total) by mouth daily.  30 tablet  5  . traMADol (ULTRAM) 50 MG tablet Take 1-2 tablets (50-100 mg total) by mouth 2 (two) times daily as needed for pain.  100 tablet  1  . triamcinolone  ointment (KENALOG) 0.1 % Apply 1 application topically 2 (two) times daily.  30 g  3     PAST MEDICAL HISTORY: Past Medical History  Diagnosis Date  . Attention deficit disorder without mention of hyperactivity   . Anxiety state, unspecified   . Pain in joint, site unspecified   . Unspecified asthma(493.90)   . Migraine without aura, without mention of intractable migraine without mention of status migrainosus   . Depressive disorder, not elsewhere classified   . Dysuria   . Family history of diabetes mellitus   . Family history of asthma   . Family history of ischemic heart disease     fam h/o premature cardiac disease   . Female stress incontinence   . Esophageal reflux   . Unspecified essential hypertension   . Persistent disorder of initiating or maintaining sleep   . Insomnia, unspecified   . Pain in limb   . Tobacco use disorder   . Unspecified vitamin D deficiency   . Abnormal weight gain   . Dyslipidemia     hypertriglyceridemia  . Hypertension     PAST SURGICAL HISTORY: Past Surgical History  Procedure Laterality Date  . Partial hysterectomy  2007  . Tonsillectomy  04/29/00  . Incise and drain abcess  2011    Right axilla- Dr. Barkley Bruns  . Abdominal ultrasound  10/12/97  . Plantar fascia surgery  2000  . Transthoracic echocardiogram  11/2009    EF=>55%; trace MR & TR  . Nm myocar perf wall motion  11/2009    bruce myoview; perfusion defect in anterior region consistent with breast attenuation; remaining myocardium with normal perfusion; post-stress EF 74%; low risk scan     FAMILY HISTORY: Family History  Problem Relation Age of Onset  . Asthma      Family history  . Coronary artery disease      1st degree female < 50  . Diabetes      1st degree relative  . Breast cancer Mother   . Multiple sclerosis Mother   . Cancer Mother 53    breast ca  . Depression Mother   . Lupus Sister     PTSD  . Alcohol abuse Sister   . Bipolar disorder Sister   .  Alcohol abuse Brother     also HTN, lupus, enlarged heart  . Heart disease Father     cardiac arrest  . Heart disease Maternal Grandfather     cardiac arrest  . COPD Paternal Grandmother   . Diabetes Paternal Grandfather   . Heart disease Paternal Grandfather   . ADD / ADHD Child   . Asthma Child     SOCIAL HISTORY:  History   Social History  .  Marital Status: Married    Spouse Name: N/A    Number of Children: 2  . Years of Education: N/A   Occupational History  . 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 Smoking status: Current Every Day Smoker -- 2.00 packs/day    Types: Cigarettes  . Smokeless tobacco: Never Used  . Alcohol Use: No  . Drug Use: No  . Sexual Activity: Yes   Other Topics Concern  . Not on file   Social History Narrative   Patient lives at home with her mother.   Disabled   Right handed   Caffeine three cups daily   Some college education     PHYSICAL EXAM   Filed Vitals:   03/24/13 1251  BP: 144/96  Pulse: 120  Height: 5' 5.5" (1.664 m)  Weight: 196 lb (88.905 kg)    Not recorded    Body mass index is 32.11 kg/(m^2).   Generalized: In no acute distress  Neck: Supple, no carotid bruits   Cardiac: Regular rate rhythm  Pulmonary: Clear to auscultation bilaterally  Musculoskeletal: No deformity  Neurological examination  Mentation: Alert oriented to time, place, history taking, and causual conversation  Cranial nerve II-XII: Pupils were equal round reactive to light. Extraocular movements were full.  Visual field were full on confrontational test. Bilateral fundi were sharp.  Facial sensation and strength were normal. Hearing was intact to finger rubbing bilaterally. Uvula tongue midline.  Head turning and shoulder shrug and were normal and symmetric.Tongue protrusion into cheek strength was normal.  Motor: Normal tone, bulk and strength.  Sensory:  Intact to fine touch, pinprick, preserved vibratory sensation, and proprioception at toes.  Coordination: Normal finger to nose, heel-to-shin bilaterally there was no truncal ataxia  Gait: Rising up from seated position without assistance, normal stance, without trunk ataxia, moderate stride, good arm swing, smooth turning, able to perform tiptoe, and heel walking without difficulty.   Romberg signs: Negative  Deep tendon reflexes: Brachioradialis 2/2, biceps 2/2, triceps 2/2, patellar 2/2, Achilles 2/2, plantar responses were flexor bilaterally.   DIAGNOSTIC DATA (LABS, IMAGING, TESTING) - I reviewed patient records, labs, notes, testing and imaging myself where available.  Lab Results  Component Value Date   WBC 9.2 04/02/2012   HGB 15.9* 04/02/2012   HCT 46.2* 04/02/2012   MCV 91.3 04/02/2012   PLT 221.0 04/02/2012      Component Value Date/Time   NA 137 12/14/2012 0849   K 3.9 12/14/2012 0849   CL 102 12/14/2012 0849   CO2 26 12/14/2012 0849   GLUCOSE 102* 12/14/2012 0849   BUN 14 12/14/2012 0849   CREATININE 0.6 12/14/2012 0849   CREATININE 0.65 04/28/2010 1346   CALCIUM 9.3 12/14/2012 0849   PROT 7.6 12/14/2012 0849   ALBUMIN 4.2 12/14/2012 0849   AST 24 12/14/2012 0849   ALT 34 12/14/2012 0849   ALKPHOS 87 12/14/2012 0849   BILITOT 0.2* 12/14/2012 0849   GFRNONAA >90 09/20/2011 1424   GFRAA >90 09/20/2011 1424   Lab Results  Component Value Date   CHOL 217* 02/18/2013   HDL 37.60* 02/18/2013   LDLCALC 102* 07/29/2011   LDLDIRECT 161.1 02/18/2013   TRIG 202.0* 02/18/2013   CHOLHDL 6 02/18/2013   Lab Results  Component Value Date   HGBA1C 5.6 02/18/2013   Lab Results  Component Value Date   VITAMINB12 530 04/02/2012   Lab Results  Component Value Date   TSH 2.32 12/14/2012      ASSESSMENT AND PLAN  ZIYA FONES is a 43 y.o. female with past medical history of migraines, now presenting with frequent headaches,  1 I will increase her nortriptyline to 25 mg 2  tablets every night 2 Maxalt as needed 3 return to clinic in 4-6 months with Hoyle Sauer.    Marcial Pacas, M.D. Ph.D.  [facility] [address] [place], [place] [address] [phone]

## 2013-03-26 ENCOUNTER — Other Ambulatory Visit (HOSPITAL_COMMUNITY): Payer: Self-pay | Admitting: Psychiatry

## 2013-03-26 ENCOUNTER — Telehealth (HOSPITAL_COMMUNITY): Payer: Self-pay

## 2013-03-26 NOTE — Telephone Encounter (Signed)
I returned patient's phone call.  She told the insurance does not pay for the Cogentin.  I recommended to call her pharmacy to request prior presentation.

## 2013-03-29 ENCOUNTER — Ambulatory Visit: Payer: Self-pay | Admitting: Pain Medicine

## 2013-03-30 ENCOUNTER — Encounter: Payer: Self-pay | Admitting: Internal Medicine

## 2013-03-30 ENCOUNTER — Ambulatory Visit (INDEPENDENT_AMBULATORY_CARE_PROVIDER_SITE_OTHER): Payer: Medicare Other | Admitting: Internal Medicine

## 2013-03-30 VITALS — BP 118/80 | HR 100 | Temp 98.5°F | Wt 196.0 lb

## 2013-03-30 DIAGNOSIS — F3289 Other specified depressive episodes: Secondary | ICD-10-CM

## 2013-03-30 DIAGNOSIS — M545 Low back pain, unspecified: Secondary | ICD-10-CM

## 2013-03-30 DIAGNOSIS — F411 Generalized anxiety disorder: Secondary | ICD-10-CM

## 2013-03-30 DIAGNOSIS — J45909 Unspecified asthma, uncomplicated: Secondary | ICD-10-CM

## 2013-03-30 DIAGNOSIS — I10 Essential (primary) hypertension: Secondary | ICD-10-CM

## 2013-03-30 DIAGNOSIS — M2550 Pain in unspecified joint: Secondary | ICD-10-CM

## 2013-03-30 DIAGNOSIS — F32A Depression, unspecified: Secondary | ICD-10-CM

## 2013-03-30 DIAGNOSIS — F329 Major depressive disorder, single episode, unspecified: Secondary | ICD-10-CM

## 2013-03-30 DIAGNOSIS — K219 Gastro-esophageal reflux disease without esophagitis: Secondary | ICD-10-CM

## 2013-03-30 DIAGNOSIS — F988 Other specified behavioral and emotional disorders with onset usually occurring in childhood and adolescence: Secondary | ICD-10-CM

## 2013-03-30 NOTE — Assessment & Plan Note (Signed)
Continue with current prescription therapy as reflected on the Med list.  

## 2013-03-30 NOTE — Assessment & Plan Note (Signed)
Better  Vit D

## 2013-03-30 NOTE — Progress Notes (Signed)
Subjective:   F/u worsened bipolar. She saw Dr Adele Schilder for a manic episode. The mood is up. C/o HAs. Trying to loose wt... [facility] - Dr Primus B[facility]- on 3/3 - feeling better...   Anxiety Presents for follow-up visit. The problem has been gradually improving. Symptoms include insomnia and nervous/anxious behavior. Patient reports no confusion, decreased concentration, depressed mood, dizziness, nausea, shortness of breath or suicidal ideas. The severity of symptoms is moderate. The quality of sleep is poor.   Her past medical history is significant for bipolar disorder.    The patient is here to follow up on chronic bipolar depression, anxiety, headaches and chronic moderate fibromyalgia and LBP symptoms partially controlled with medicines, diet and exercise. F/u HTN, asthma.  She is on SS disability for her bipolar  Wt Readings from Last 3 Encounters:  03/30/13 196 lb (88.905 kg)  03/24/13 196 lb (88.905 kg)  02/09/13 200 lb (90.719 kg)   BP Readings from Last 3 Encounters:  03/30/13 118/80  03/24/13 144/96  02/09/13 140/90       Review of Systems  Constitutional: Positive for fatigue. Negative for chills, activity change, appetite change and unexpected weight change.  HENT: Negative for congestion, mouth sores and sinus pressure.   Eyes: Negative for visual disturbance.  Respiratory: Positive for cough. Negative for chest tightness and shortness of breath.   Gastrointestinal: Negative for nausea.  Genitourinary: Negative for frequency, difficulty urinating and vaginal pain.  Musculoskeletal: Negative for gait problem.  Skin: Negative for pallor and rash.  Neurological: Negative for dizziness and tremors.  Psychiatric/Behavioral: Positive for behavioral problems, sleep disturbance and dysphoric mood. Negative for suicidal ideas, confusion and decreased concentration. The patient is nervous/anxious and has insomnia.        Objective:   Physical Exam   Constitutional: She appears well-developed. No distress.  obese  HENT:  Head: Normocephalic.  Right Ear: External ear normal.  Left Ear: External ear normal.  Nose: Nose normal.  Mouth/Throat: Oropharynx is clear and moist.  Eyes: Conjunctivae are normal. Pupils are equal, round, and reactive to light. Right eye exhibits no discharge. Left eye exhibits no discharge.  Neck: Normal range of motion. Neck supple. No JVD present. No tracheal deviation present. No thyromegaly present.  Cardiovascular: Normal rate, regular rhythm and normal heart sounds.   Pulmonary/Chest: No stridor. No respiratory distress. She has no wheezes.  Abdominal: Soft. Bowel sounds are normal. She exhibits no distension and no mass. There is no tenderness. There is no rebound and no guarding.  Musculoskeletal: She exhibits no edema and no tenderness.  Lymphadenopathy:    She has no cervical adenopathy.  Neurological: She displays tremor. She displays normal reflexes. No cranial nerve deficit. She exhibits normal muscle tone. Coordination normal.  Skin: No rash noted. No erythema.  Psychiatric: Her behavior is normal. Judgment and thought content normal. Her mood appears not anxious. Thought content is not paranoid. She does not exhibit a depressed mood. She expresses no homicidal and no suicidal ideation.  Looks much better Restless legs   Lab Results  Component Value Date   WBC 9.2 04/02/2012   HGB 15.9* 04/02/2012   HCT 46.2* 04/02/2012   PLT 221.0 04/02/2012   GLUCOSE 102* 12/14/2012   CHOL 217* 02/18/2013   TRIG 202.0* 02/18/2013   HDL 37.60* 02/18/2013   LDLDIRECT 161.1 02/18/2013   LDLCALC 102* 07/29/2011   ALT 34 12/14/2012   AST 24 12/14/2012   NA 137 12/14/2012  K 3.9 12/14/2012   CL 102 12/14/2012   CREATININE 0.6 12/14/2012   BUN 14 12/14/2012   CO2 26 12/14/2012   TSH 2.32 12/14/2012   HGBA1C 5.6 02/18/2013           Assessment & Plan:

## 2013-03-30 NOTE — Assessment & Plan Note (Signed)
[facility]  5/14 Pain med Dr Primus Bravo - [place]

## 2013-03-30 NOTE — Progress Notes (Signed)
Pre visit review using our clinic review tool, if applicable. No additional management support is needed unless otherwise documented below in the visit note. 

## 2013-04-01 ENCOUNTER — Other Ambulatory Visit: Payer: Self-pay | Admitting: Internal Medicine

## 2013-04-01 ENCOUNTER — Encounter (HOSPITAL_COMMUNITY): Payer: Self-pay | Admitting: Psychiatry

## 2013-04-01 ENCOUNTER — Telehealth: Payer: Self-pay | Admitting: Cardiovascular Disease

## 2013-04-01 ENCOUNTER — Encounter: Payer: Self-pay | Admitting: Internal Medicine

## 2013-04-01 ENCOUNTER — Encounter (HOSPITAL_COMMUNITY): Payer: Self-pay | Admitting: *Deleted

## 2013-04-01 ENCOUNTER — Telehealth (HOSPITAL_COMMUNITY): Payer: Self-pay | Admitting: *Deleted

## 2013-04-01 ENCOUNTER — Ambulatory Visit (INDEPENDENT_AMBULATORY_CARE_PROVIDER_SITE_OTHER): Payer: Self-pay | Admitting: Psychiatry

## 2013-04-01 ENCOUNTER — Ambulatory Visit (INDEPENDENT_AMBULATORY_CARE_PROVIDER_SITE_OTHER): Payer: Medicare Other | Admitting: Internal Medicine

## 2013-04-01 VITALS — BP 124/98 | HR 127 | Ht 66.0 in | Wt 190.0 lb

## 2013-04-01 VITALS — BP 120/86 | HR 92 | Temp 98.1°F | Wt 193.2 lb

## 2013-04-01 DIAGNOSIS — R112 Nausea with vomiting, unspecified: Secondary | ICD-10-CM | POA: Insufficient documentation

## 2013-04-01 DIAGNOSIS — F319 Bipolar disorder, unspecified: Secondary | ICD-10-CM

## 2013-04-01 DIAGNOSIS — K5900 Constipation, unspecified: Secondary | ICD-10-CM

## 2013-04-01 DIAGNOSIS — I10 Essential (primary) hypertension: Secondary | ICD-10-CM

## 2013-04-01 DIAGNOSIS — K5909 Other constipation: Secondary | ICD-10-CM

## 2013-04-01 DIAGNOSIS — F988 Other specified behavioral and emotional disorders with onset usually occurring in childhood and adolescence: Secondary | ICD-10-CM

## 2013-04-01 DIAGNOSIS — F411 Generalized anxiety disorder: Secondary | ICD-10-CM

## 2013-04-01 MED ORDER — LISDEXAMFETAMINE DIMESYLATE 30 MG PO CAPS: 30.0000 mg | ORAL_CAPSULE | ORAL | Status: DC

## 2013-04-01 MED ORDER — PANTOPRAZOLE SODIUM 40 MG PO TBEC: 40.0000 mg | DELAYED_RELEASE_TABLET | Freq: Every day | ORAL | Status: DC

## 2013-04-01 MED ORDER — HYDROXYZINE PAMOATE 100 MG PO CAPS: 100.0000 mg | ORAL_CAPSULE | Freq: Every day | ORAL | Status: DC

## 2013-04-01 MED ORDER — DIAZEPAM 5 MG PO TABS: 5.0000 mg | ORAL_TABLET | Freq: Three times a day (TID) | ORAL | Status: DC | PRN

## 2013-04-01 MED ORDER — ARIPIPRAZOLE 20 MG PO TABS: ORAL_TABLET | ORAL | Status: DC

## 2013-04-01 MED ORDER — BENZTROPINE MESYLATE 1 MG PO TABS: 1.0000 mg | ORAL_TABLET | Freq: Every day | ORAL | Status: DC

## 2013-04-01 NOTE — Progress Notes (Signed)
[name] given copies of Prior Authorizations received from [facility] 03/11/12 for Benztropine and Hydroxyzine. Approved through 03/11/14

## 2013-04-01 NOTE — Assessment & Plan Note (Signed)
Consider linzess asd,  to f/u any worsening symptoms or concerns

## 2013-04-01 NOTE — Telephone Encounter (Signed)
Patient left VM: Only received one RX for Vyvanse this appt today. Usually gets two. Should she come back today for the second one or later in the month?  Left message on generic IO:EVOJ office when medication is getting low and MD will write the next RX

## 2013-04-01 NOTE — Progress Notes (Signed)
Pre visit review using our clinic review tool, if applicable. No additional management support is needed unless otherwise documented below in the visit note. 

## 2013-04-01 NOTE — Telephone Encounter (Signed)
Pt Started on Metoprolol about 6 weeks ago. She been vomiting every since she started on.She wonder if this might be a side effect?

## 2013-04-01 NOTE — Progress Notes (Signed)
[facility] [phone] Progress Note  [name] [id number] 43 y.o.  04/01/2013 9:19 AM  Chief Complaint:  I am under a lot of stress.  I cannot sleep very well.  I have nausea all the time.      History of Present Illness:  [name] came for her followup appointment.   She complained of increased anxiety and stress.  Patient is living with her mother and she feels increased stress related to family situation.  She admitted sometime irritability and anger.  She appears very anxious.  She recently seen her primary care physician and neurologist.  Patient told her neurologist recommended not to take Cymbalta and gabapentin together and she had cut down her Cymbalta to 30 mg in a stop gabapentin .  Patient appears very anxious and nervous.  She endorse poor sleep, crying spells , racing thoughts and some time feeling very isolated and withdrawn.  She denies any agitation, paranoia or any anger.  She continues to take multiple pain medication along with multiple psychotropic medication.  She is taking Cogentin which is helping her tremors.  Recently her neurologist recommended to increase nortriptyline because she continues to have headaches.  So far she has not seen a huge improvement.  She is complaining of nausea which is chronic but not sure if any medicine causing it.  She is taking Phenergan and she had discussed the nausea with her primary care physician.  She recently had blood work.  Her hemoglobin A1c is improved from the past.  She also lost weight from the last visit.  She endorse financial strain and is still awaiting her Medicaid to be approved.  They have recommended counseling the patient at this time cannot afford.  She is not drinking or using any illegal substances.  Suicidal Ideation: No Plan Formed: No Patient has means to carry out plan: No  Homicidal Ideation: No Plan Formed: No Patient has means to carry out plan: No  Past Psychiatric  History/Hospitalization(s) Patient endorsed history of mood swings anger and irritability since childhood.  She is taking medications for ADD since 2005 however she never had formally tested for ADD.  In the past she had tried Focalin, Vyvanse which worked very well for her until she lost her insurance.  She was seeing Eartha Inch from 2011-2013 for the management of bipolar disorder and ADD.  She did prescribe lithium which makes patient careless, doxepin which helped her depression and quetiapine but patient did not see any improvement.  She also tried temazepam, Ambien which worked for only a short time.  She has given Valium with good response .  Patient denies any history of suicidal attempt but admitted having a strong history of suicidal ideation last year and she was seen in the emergency room however discharge next day.  Patient was never admitted for inpatient services.  Patient denies any history of paranoia , psychosis or any hallucination but admitted history of severe mood swings irritability impulsive behavior , agitation and crying spells.   Patient endorses history of  Impulsive buying, spending money and panic attack .   Anxiety: Yes Bipolar Disorder: Yes Depression: Yes Mania: Yes Psychosis: No Schizophrenia: No Personality Disorder: No Hospitalization for psychiatric illness: No History of Electroconvulsive Shock Therapy: No Prior Suicide Attempts: No  Medical History; Patient has multiple medical problems.  Her primary care physician is Dr. Lovena Le. She has hypertension, asthma, GERD, eczema, cystitis, vitamin D deficiency, chronic pain, chronic fatigue, urinary incontinence , migraine headaches, hyperlipidemia  and narcolepsy.    Psychosocial History; Patient is born and raised in [place].  She endorse her childhood was chaotic because her parents were not happy in their marriage.  Later they were divorced .  She's been married for 22 years however does not feel that  her husband is supportive.  Her husband live out of town and comes on and off to visit her .  Patient believes that her husband cannot handle her psychiatric illness.   The patient has 2 children who are grown up.patient currently lives with her mother who has dementia and niece because her sister cannot handle her children.    Review of Systems: Psychiatric: Agitation: Yes Hallucination: No Depressed Mood: Yes Insomnia: Yes Hypersomnia: No Altered Concentration: No Feels Worthless: Yes Grandiose Ideas: No Belief In Special Powers: No New/Increased Substance Abuse: No Compulsions: No  Neurologic: Headache: Yes Seizure: No Paresthesias: No  Outpatient Encounter Prescriptions as of 04/01/2013  Medication Sig  . ARIPiprazole (ABILIFY) 20 MG tablet Take one tablet by mouth one time daily  . benztropine (COGENTIN) 1 MG tablet Take 1 tablet (1 mg total) by mouth at bedtime.  . diazepam (VALIUM) 5 MG tablet Take 1 tablet (5 mg total) by mouth 3 (three) times daily as needed for anxiety.  . diclofenac (VOLTAREN) 75 MG EC tablet TAKE ONE TABLET BY MOUTH TWICE DAILY AS NEEDED FOR PAIN   . gabapentin (NEURONTIN) 300 MG capsule TAKE ONE CAPSULE BY MOUTH THREE TIMES DAILY  . hydrOXYzine (VISTARIL) 100 MG capsule Take 1 capsule (100 mg total) by mouth at bedtime.  [name] lisdexamfetamine (VYVANSE) 30 MG capsule Take 1 capsule (30 mg total) by mouth every morning.  . nortriptyline (PAMELOR) 25 MG capsule Take 2 capsules (50 mg total) by mouth at bedtime. take 1 capsule by mouth at bedtime  . omeprazole (PRILOSEC) 20 MG capsule Take 1 capsule (20 mg total) by mouth daily.  . promethazine (PHENERGAN) 12.5 MG tablet TAKE ONE TABLET BY MOUTH EVERY SIX HOURS AS NEEDED FOR NAUSEA  . traMADol (ULTRAM) 50 MG tablet Take 1-2 tablets (50-100 mg total) by mouth 2 (two) times daily as needed for pain.  [name] triamcinolone ointment (KENALOG) 0.1 % Apply 1 application topically 2 (two) times daily.  . [DISCONTINUED]  ARIPiprazole (ABILIFY) 20 MG tablet Take one tablet by mouth one time daily  . [DISCONTINUED] benztropine (COGENTIN) 1 MG tablet Take 1 tablet (1 mg total) by mouth at bedtime.  . [DISCONTINUED] diazepam (VALIUM) 5 MG tablet Take 1 tablet (5 mg total) by mouth 3 (three) times daily as needed for anxiety.  . [DISCONTINUED] DULoxetine (CYMBALTA) 30 MG capsule Take two capsules (60mg ) by mouth daily  . [DISCONTINUED] hydrOXYzine (VISTARIL) 50 MG capsule Take 1 capsule (50 mg total) by mouth at bedtime.  . [DISCONTINUED] lisdexamfetamine (VYVANSE) 30 MG capsule Take 1 capsule (30 mg total) by mouth every morning.  [name] albuterol (PROAIR HFA) 108 (90 BASE) MCG/ACT inhaler Inhale 2 puffs into the lungs every 4 (four) hours as needed. For shortness of breath.  . fluorometholone (FML) 0.1 % ophthalmic suspension   . HYDROcodone-acetaminophen (NORCO/VICODIN) 5-325 MG per tablet   . metoprolol tartrate (LOPRESSOR) 25 MG tablet Take 1 tablet (25 mg total) by mouth 2 (two) times daily.  . RESTASIS 0.05 % ophthalmic emulsion   . rizatriptan (MAXALT-MLT) 10 MG disintegrating tablet Take 1 tablet (10 mg total) by mouth as needed for migraine. May repeat in 2 hours if needed, maximum 2 tabs a day  .  solifenacin (VESICARE) 10 MG tablet Take 0.5 tablets (5 mg total) by mouth daily.    Recent Results (from the past 2160 hour(s))  LIPID PANEL     Status: Abnormal   Collection Time    02/18/13  8:17 AM      Result Value Ref Range   Cholesterol 217 (*) 0 - 200 mg/dL   Comment: ATP III Classification       Desirable:  < 200 mg/dL               Borderline High:  200 - 239 mg/dL          High:  > = 240 mg/dL   Triglycerides 202.0 (*) 0.0 - 149.0 mg/dL   Comment: Normal:  <150 mg/dLBorderline High:  150 - 199 mg/dL   HDL 37.60 (*) >39.00 mg/dL   VLDL 40.4 (*) 0.0 - 40.0 mg/dL   Total CHOL/HDL Ratio 6     Comment:                Men          Women1/2 Average Risk     3.4          3.3Average Risk          5.0           4.42X Average Risk          9.6          7.13X Average Risk          15.0          11.0                      HEMOGLOBIN A1C     Status: None   Collection Time    02/18/13  8:17 AM      Result Value Ref Range   Hemoglobin A1C 5.6  4.6 - 6.5 %   Comment: Glycemic Control Guidelines for People with Diabetes:Non Diabetic:  <6%Goal of Therapy: <7%Additional Action Suggested:  >8%   LDL CHOLESTEROL, DIRECT     Status: None   Collection Time    02/18/13  8:17 AM      Result Value Ref Range   Direct LDL 161.1     Comment: Optimal:  <100 mg/dLNear or Above Optimal:  100-129 mg/dLBorderline High:  130-159 mg/dLHigh:  160-189 mg/dLVery High:  >190 mg/dL    Physical Exam: Constitutional:  BP 124/98  Pulse 127  Ht 5\' 6"  (1.676 m)  Wt 190 lb (86.183 kg)  BMI 30.68 kg/m2  Musculoskeletal: Strength & Muscle Tone: within normal limits Gait & Station: normal Patient leans: N/A  Mental Status Examination;   Patient is casually dressed and fairly groomed.  She appears anxious and nervous but cooperative.  Her speech is clear and coherent.  Her thought processes fast but logical.  Her attention and concentration is fair.  She described her mood depressed, anxious and her affect is mood appropriate.  She maintained fair eye contact.  Her fund of knowledge is adequate.  There were no tremors or shakes.  Her attention and concentration is fair.  There were no delusions, paranoia or any obsession.  She denies any active or passive suicidal thoughts or homicidal thoughts.  There were no flight of ideas or any loose association present at this time.  She is alert and oriented x3.  Her insight judgment and impulse control is okay.     Assessment: Axis I:  Bipolar disorder NOS, attention deficit disorder by history   Axis II:  Deferred   Axis III:  Past Medical History  Diagnosis Date  . Attention deficit disorder without mention of hyperactivity   . Anxiety state, unspecified   . Pain in joint, site  unspecified   . Unspecified asthma(493.90)   . Migraine without aura, without mention of intractable migraine without mention of status migrainosus   . Depressive disorder, not elsewhere classified   . Dysuria   . Family history of diabetes mellitus   . Family history of asthma   . Family history of ischemic heart disease     fam h/o premature cardiac disease   . Female stress incontinence   . Esophageal reflux   . Unspecified essential hypertension   . Persistent disorder of initiating or maintaining sleep   . Insomnia, unspecified   . Pain in limb   . Tobacco use disorder   . Unspecified vitamin D deficiency   . Abnormal weight gain   . Dyslipidemia     hypertriglyceridemia  . Hypertension     Axis IV:  Moderate    Plan:  I reviewed her records from neurologist and her primary care physician along with the results .  Her hemoglobin A1c is improved however she still has high cholesterol.  The patient is complaining of nausea which is chronic .  Patient has cut down her Cymbalta 30 mg daily and she is not taking gabapentin because her neurologist recommended that these medicines cannot be taken together.  I explained that she should discontinue it Cymbalta but continued to take gabapentin since it is helping her chronic pain and anxiety.  I would also increased her Vistaril 100 mg at bedtime to help anxiety and insomnia.  Recently her nortriptyline was increased and I recommended to see the response of nortriptyline 50 mg but if she does not feel any better and she should take 75 mg and informed her neurologist since nortriptyline this antidepressant and can also help her anxiety.  She is taking nortriptyline for headaches.  Discussed in detail the risks and benefits of medication.  Discussed polypharmacy.  I will discontinue Cymbalta .  Continue Abilify 20 mg daily, Cogentin 1 mg at bedtime, Vyvanse 30 mg daily, Valium 5 mg 3 times a day and I would increase this to 100 mg at bedtime.  I  recommended to call us back if she approved for her Medicaid so we can schedule appointment with therapist.  Recommend call us back if she has any question or any concern.  Followup in 2 months. Time spent 25 minutes.  More than 50% of the time spent in psychoeducation, counseling and coordination of care.  Discuss safety plan that anytime having active suicidal thoughts or homicidal thoughts then patient need to call 911 or go to the local emergency room.     Malasia Torain T., MD 04/01/2013

## 2013-04-01 NOTE — Telephone Encounter (Signed)
Refill request for gabapentin Last filled by MD on - 12/14/2012 #90 x2 Last Appt: 03/30/2013 Next Appt: 10/01/2013  Pt also requesting Duloxetine, however computer shows it was D/C.  Please advise refills?

## 2013-04-01 NOTE — Patient Instructions (Signed)
Ok to take Miralax daily OK to stop the prilosec Please take all new medication as prescribed  - the protonix  Please call in 3-5 days if not improved, to consider GI referral

## 2013-04-01 NOTE — Telephone Encounter (Signed)
Returned call and pt verified x 2.  Pt c/o nausea x ~ 6 weeks.  Stated she has Rx for Phenergan and sometimes it helps.  Stated metoprolol is the only med she has started during this time frame, but stated she has had other meds adjusted.  Pt stated BP has been like 150/100 and 120/80 w/ HR 126 sometimes.  Pt advised to come in for BP check for med adjustment if BP is fluctuating that much.  Also advised to see PCP r/t nausea and vomiting as these are symptoms of almost every medication, but metoprolol should not be causing severe n/v.  Offered appt tomorrow for BP check and pt declined.  Stated she can only come in later today or Monday.  Pt informed no available appts for either day for BP check.  Advised she contact PCP and also request Pheneran suppositories if she is not able to keep the pills down as she indicated.  Pt verbalized understanding and agreed w/ plan.  Pt wanted to keep her appt on 3.16.15 w/ Dr. Loletha Grayer and informed she could and advised she see PCP for BP check.  Pt agreed and stated BP was good today.

## 2013-04-01 NOTE — Assessment & Plan Note (Signed)
Unclear etiology though chronic constipation vs polypharmacy vs other could be related, to change omeprazole to protonix 40, add miralax daily, consider linzess trial and/or GI referral
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
## 2013-04-01 NOTE — Assessment & Plan Note (Signed)
stable overall by history and exam, recent data reviewed with pt, and pt to continue medical treatment as before,  to f/u any worsening symptoms or concerns BP Readings from Last 3 Encounters:  04/01/13 120/86  04/01/13 124/98  03/30/13 118/80

## 2013-04-01 NOTE — Assessment & Plan Note (Signed)
Chronic, ? Element of the n/v, cont same tx for now, to f/u any worsening symptoms or concerns

## 2013-04-02 ENCOUNTER — Ambulatory Visit (HOSPITAL_COMMUNITY): Payer: Self-pay | Admitting: Psychiatry

## 2013-04-02 ENCOUNTER — Other Ambulatory Visit: Payer: Self-pay | Admitting: Internal Medicine

## 2013-04-02 MED ORDER — ONDANSETRON HCL 4 MG PO TABS: 4.0000 mg | ORAL_TABLET | Freq: Three times a day (TID) | ORAL | Status: DC | PRN

## 2013-04-07 ENCOUNTER — Telehealth (HOSPITAL_COMMUNITY): Payer: Self-pay | Admitting: *Deleted

## 2013-04-07 ENCOUNTER — Telehealth (HOSPITAL_COMMUNITY): Payer: Self-pay

## 2013-04-07 NOTE — Telephone Encounter (Signed)
Samples obtained thru [facility]

## 2013-04-07 NOTE — Telephone Encounter (Signed)
Patient left VQ:MGQQ Abilify.Can't refill until 4/3.Do we have any samples?  Contacted patient. Patient states she went to [facility].Can't find medicine since returning.May have left it there.Advised her that have requested samples from [facility] and will let her know if availble.Encouraged to contact her hotel to see if turned in.

## 2013-04-07 NOTE — Telephone Encounter (Signed)
04/07/13 3:33pm Patient came and pick up samples of Abilify 10mg  5-boxes  DL [id number].Marland KitchenMariana Kaufman

## 2013-04-12 ENCOUNTER — Ambulatory Visit (INDEPENDENT_AMBULATORY_CARE_PROVIDER_SITE_OTHER): Payer: Medicare Other | Admitting: Cardiovascular Disease

## 2013-04-12 ENCOUNTER — Encounter: Payer: Self-pay | Admitting: Internal Medicine

## 2013-04-12 ENCOUNTER — Other Ambulatory Visit: Payer: Self-pay | Admitting: Internal Medicine

## 2013-04-12 ENCOUNTER — Encounter: Payer: Self-pay | Admitting: Cardiovascular Disease

## 2013-04-12 VITALS — BP 124/82 | HR 76 | Resp 16 | Ht 65.0 in | Wt 194.8 lb

## 2013-04-12 DIAGNOSIS — R000 Tachycardia, unspecified: Secondary | ICD-10-CM

## 2013-04-12 DIAGNOSIS — R110 Nausea: Secondary | ICD-10-CM

## 2013-04-12 NOTE — Patient Instructions (Signed)
Your physician recommends that you schedule a follow-up appointment in: ONE YEAR with Dr.Croitoru  

## 2013-04-14 ENCOUNTER — Encounter: Payer: Self-pay | Admitting: Gastroenterology

## 2013-04-15 ENCOUNTER — Other Ambulatory Visit: Payer: Self-pay | Admitting: Internal Medicine

## 2013-04-18 ENCOUNTER — Encounter: Payer: Self-pay | Admitting: Cardiovascular Disease

## 2013-04-18 NOTE — Progress Notes (Signed)
Patient ID: [name], female   DOB: 1970/12/13, 43 y.o.   MRN: [id number]      Reason for office visit Tachycardia  [name] returns for followup after starting beta blocker therapy and to review the results of her echocardiogram. The echo shows no evidence of significant structural heart abnormalities. Initially when she started the beta blocker she felt unwell with fatigue, nausea and vomiting. She thought this might be related to her medications but her primary care physician has referred her to a gastroenterologist. It is important to note that 3 or 4 years ago she was taking 200 mg of metoprolol daily without these complaints. She keeps a log of her heart rate and blood pressure. For the most part her blood pressure is normal. At times her blood pressure is elevated and this coincides with the times that her heart rate is elevated, suggesting a common adrenergic stimulus for both.   Allergies  Allergen Reactions  . Amoxicillin     REACTION: QUESTIONABLE  . Doxycycline     REACTION: vomiting  . Hydrochlorothiazide     REACTION: CRAMPS AT HIGHER DOSAGES  . Lisinopril     REACTION: COUGH  . Penicillins   . Percocet [Oxycodone-Acetaminophen]     itching  . Talwin [Pentazocine]     n/v    Current Outpatient Prescriptions  Medication Sig Dispense Refill  . albuterol (PROAIR HFA) 108 (90 BASE) MCG/ACT inhaler Inhale 2 puffs into the lungs every 4 (four) hours as needed. For shortness of breath.  1 Inhaler  5  . ARIPiprazole (ABILIFY) 20 MG tablet Take one tablet by mouth one time daily  30 tablet  1  . benztropine (COGENTIN) 1 MG tablet Take 1 tablet (1 mg total) by mouth at bedtime.  30 tablet  1  . diazepam (VALIUM) 5 MG tablet Take 1 tablet (5 mg total) by mouth 3 (three) times daily as needed for anxiety.  90 tablet  1  . fluorometholone (FML) 0.1 % ophthalmic suspension       . gabapentin (NEURONTIN) 300 MG capsule Take one capsule by mouth three times daily  90 capsule  3   . hydrOXYzine (VISTARIL) 100 MG capsule Take 1 capsule (100 mg total) by mouth at bedtime.  30 capsule  1  . lisdexamfetamine (VYVANSE) 30 MG capsule Take 1 capsule (30 mg total) by mouth every morning.  30 capsule  0  . metoprolol tartrate (LOPRESSOR) 25 MG tablet Take 1 tablet (25 mg total) by mouth 2 (two) times daily.  180 tablet  3  . nortriptyline (PAMELOR) 25 MG capsule Take 75 mg by mouth at bedtime.      . ondansetron (ZOFRAN) 4 MG tablet Take 1 tablet (4 mg total) by mouth every 8 (eight) hours as needed for nausea or vomiting.  20 tablet  0  . pantoprazole (PROTONIX) 40 MG tablet Take 1 tablet (40 mg total) by mouth daily.  90 tablet  3  . RESTASIS 0.05 % ophthalmic emulsion       . rizatriptan (MAXALT-MLT) 10 MG disintegrating tablet Take 1 tablet (10 mg total) by mouth as needed for migraine. May repeat in 2 hours if needed, maximum 2 tabs a day  15 tablet  12  . solifenacin (VESICARE) 10 MG tablet Take 0.5 tablets (5 mg total) by mouth daily.  30 tablet  5  . traMADol (ULTRAM) 50 MG tablet Take 1-2 tablets (50-100 mg total) by mouth 2 (two) times daily as needed for  pain.  100 tablet  1  . triamcinolone ointment (KENALOG) 0.1 % Apply 1 application topically 2 (two) times daily.  30 g  3  . diclofenac (VOLTAREN) 75 MG EC tablet TAKE ONE TABLET BY MOUTH TWICE DAILY AS NEEDED FOR PAIN   20 tablet  0  . promethazine (PHENERGAN) 12.5 MG tablet Take 1 tablet (12.5 mg total) by mouth every 6 (six) hours as needed for nausea or vomiting.  30 tablet  0   No current facility-administered medications for this visit.    Past Medical History  Diagnosis Date  . Attention deficit disorder without mention of hyperactivity   . Anxiety state, unspecified   . Pain in joint, site unspecified   . Unspecified asthma(493.90)   . Migraine without aura, without mention of intractable migraine without mention of status migrainosus   . Depressive disorder, not elsewhere classified   . Dysuria   .  Family history of diabetes mellitus   . Family history of asthma   . Family history of ischemic heart disease     fam h/o premature cardiac disease   . Female stress incontinence   . Esophageal reflux   . Unspecified essential hypertension   . Persistent disorder of initiating or maintaining sleep   . Insomnia, unspecified   . Pain in limb   . Tobacco use disorder   . Unspecified vitamin D deficiency   . Abnormal weight gain   . Dyslipidemia     hypertriglyceridemia  . Hypertension   . Chronic constipation 04/01/2013    Past Surgical History  Procedure Laterality Date  . Partial hysterectomy  2007  . Tonsillectomy  04/29/00  . Incise and drain abcess  2011    Right axilla- Dr. Purnell Shoemaker  . Abdominal ultrasound  10/12/97  . Plantar fascia surgery  2000  . Transthoracic echocardiogram  11/2009    EF=>55%; trace MR & TR  . Nm myocar perf wall motion  11/2009    bruce myoview; perfusion defect in anterior region consistent with breast attenuation; remaining myocardium with normal perfusion; post-stress EF 74%; low risk scan     Family History  Problem Relation Age of Onset  . Asthma      Family history  . Coronary artery disease      1st degree female < 50  . Diabetes      1st degree relative  . Breast cancer Mother   . Multiple sclerosis Mother   . Cancer Mother 83    breast ca  . Depression Mother   . Lupus Sister     PTSD  . Alcohol abuse Sister   . Bipolar disorder Sister   . Alcohol abuse Brother     also HTN, lupus, enlarged heart  . Heart disease Father     cardiac arrest  . Heart disease Maternal Grandfather     cardiac arrest  . COPD Paternal Grandmother   . Diabetes Paternal Grandfather   . Heart disease Paternal Grandfather   . ADD / ADHD Child   . Asthma Child     History   Social History  . Marital Status: Married    Spouse Name: N/A    Number of Children: 2  . Years of Education: N/A   Occupational History  . HR; waiting table 2 d/wk; back  to school; filed for disability; lost her job 2011     Social History Main Topics  . Smoking status: Current Every Day Smoker -- 2.00 packs/day  Types: Cigarettes  . Smokeless tobacco: Never Used  . Alcohol Use: No  . Drug Use: No  . Sexual Activity: Yes   Other Topics Concern  . Not on file   Social History Narrative   Patient lives at home with her mother.   Disabled   Right handed   Caffeine three cups daily   Some college education    Review of systems: The patient specifically denies any chest pain at rest or with exertion, dyspnea at rest or with exertion, orthopnea, paroxysmal nocturnal dyspnea, syncope, palpitations, focal neurological deficits, intermittent claudication, lower extremity edema, unexplained weight gain, cough, hemoptysis. Occasional wheezing, infrequently uses her inhaler  The patient also denies abdominal pain,dysphagia, diarrhea, constipation, polyuria, polydipsia, dysuria, hematuria, frequency, urgency, abnormal bleeding or bruising, fever, chills, unexpected weight changes, mood swings, change in skin or hair texture, change in voice quality, auditory or visual problems, allergic reactions or rashes, new musculoskeletal complaints other than usual "aches and pains".    PHYSICAL EXAM BP 124/82  Pulse 76  Resp 16  Ht 5\' 5"  (1.651 m)  Wt 88.361 kg (194 lb 12.8 oz)  BMI 32.42 kg/m2  General: Alert, oriented x3, no distress Head: no evidence of trauma, PERRL, EOMI, no exophtalmos or lid lag, no myxedema, no xanthelasma; normal ears, nose and oropharynx Neck: normal jugular venous pulsations and no hepatojugular reflux; brisk carotid pulses without delay and no carotid bruits Chest: clear to auscultation, no signs of consolidation by percussion or palpation, normal fremitus, symmetrical and full respiratory excursions Cardiovascular: normal position and quality of the apical impulse, regular rhythm, normal first and second heart sounds, no murmurs, rubs  or gallops Abdomen: no tenderness or distention, no masses by palpation, no abnormal pulsatility or arterial bruits, normal bowel sounds, no hepatosplenomegaly Extremities: no clubbing, cyanosis or edema; 2+ radial, ulnar and brachial pulses bilaterally; 2+ right femoral, posterior tibial and dorsalis pedis pulses; 2+ left femoral, posterior tibial and dorsalis pedis pulses; no subclavian or femoral bruits Neurological: grossly nonfocal   EKG: Sinus rhythm  Lipid Panel     Component Value Date/Time   CHOL 217* 02/18/2013 0817   TRIG 202.0* 02/18/2013 0817   HDL 37.60* 02/18/2013 0817   CHOLHDL 6 02/18/2013 0817   VLDL 40.4* 02/18/2013 0817   LDLCALC 102* 07/29/2011 0958    BMET    Component Value Date/Time   NA 137 12/14/2012 0849   K 3.9 12/14/2012 0849   CL 102 12/14/2012 0849   CO2 26 12/14/2012 0849   GLUCOSE 102* 12/14/2012 0849   BUN 14 12/14/2012 0849   CREATININE 0.6 12/14/2012 0849   CREATININE 0.65 04/28/2010 1346   CALCIUM 9.3 12/14/2012 0849   GFRNONAA >90 09/20/2011 1424   GFRAA >90 09/20/2011 1424     ASSESSMENT AND PLAN  At this point in time the does not appear to be any sign of structural cardiac abnormalities. Her echo was normal. Today her heart rate and blood pressure are excellent. She had a normal nuclear stress test in 2011. She has mildly elevated total cholesterol, LDL cholesterol and triglycerides, all in the range one would expect to improve just with lifestyle changes. Periodic followup is important since she has a strong family history of premature heart disease. At this point I would not recommend any changes in her medications.  No orders of the defined types were placed in this encounter.   Meds ordered this encounter  Medications  . nortriptyline (PAMELOR) 25 MG capsule    Sig:  Take 75 mg by mouth at bedtime.    Holli Humbles, MD, [facility] [phone] office [phone] pager

## 2013-04-21 ENCOUNTER — Encounter: Payer: Self-pay | Admitting: Internal Medicine

## 2013-04-26 ENCOUNTER — Ambulatory Visit: Payer: Self-pay | Admitting: Pain Medicine

## 2013-04-28 ENCOUNTER — Telehealth (HOSPITAL_COMMUNITY): Payer: Self-pay | Admitting: *Deleted

## 2013-04-28 DIAGNOSIS — F988 Other specified behavioral and emotional disorders with onset usually occurring in childhood and adolescence: Secondary | ICD-10-CM

## 2013-04-28 MED ORDER — LISDEXAMFETAMINE DIMESYLATE 30 MG PO CAPS: 30.0000 mg | ORAL_CAPSULE | ORAL | Status: DC

## 2013-04-28 NOTE — Telephone Encounter (Signed)
Patient left [id number] refill of Vyvanse 30 mg.Also wanted MD to know taking 3 tablets of Nortriptyline and it is working.Needs new prescription at new dose

## 2013-04-29 ENCOUNTER — Other Ambulatory Visit (HOSPITAL_COMMUNITY): Payer: Self-pay | Admitting: Psychiatry

## 2013-05-03 ENCOUNTER — Telehealth (HOSPITAL_COMMUNITY): Payer: Self-pay

## 2013-05-03 ENCOUNTER — Other Ambulatory Visit: Payer: Self-pay | Admitting: Internal Medicine

## 2013-05-03 ENCOUNTER — Encounter: Payer: Self-pay | Admitting: Internal Medicine

## 2013-05-03 MED ORDER — LORATADINE 10 MG PO TABS: 10.0000 mg | ORAL_TABLET | Freq: Every day | ORAL | Status: DC

## 2013-05-03 NOTE — Telephone Encounter (Signed)
05/03/13 2:21PM  Patient came and pick-up rx script - DL  #[id number].Marland KitchenMariana Kaufman

## 2013-05-07 ENCOUNTER — Other Ambulatory Visit: Payer: Self-pay | Admitting: Internal Medicine

## 2013-05-10 ENCOUNTER — Encounter: Payer: Self-pay | Admitting: Internal Medicine

## 2013-05-10 ENCOUNTER — Telehealth (HOSPITAL_COMMUNITY): Payer: Self-pay | Admitting: *Deleted

## 2013-05-10 MED ORDER — NORTRIPTYLINE HCL 75 MG PO CAPS: 75.0000 mg | ORAL_CAPSULE | Freq: Every day | ORAL | Status: DC

## 2013-05-10 NOTE — Telephone Encounter (Signed)
The patient tried nortriptyline 75 mg with good response.  She is sleeping better.  However she does not have any prescription of nortriptyline and she liked to have prescription called in.  I will call nortriptyline 75 mg at bedtime to her local pharmacy at [facility].

## 2013-05-10 NOTE — Telephone Encounter (Signed)
Patient left VM:Was told to increase Nortriptylne to 75 mg and let MD know if it worked better.It did, but now she needs a prescription for it at the higher dose. Contacted pt to let her know new prescription sent to pharmacy

## 2013-05-11 ENCOUNTER — Ambulatory Visit: Payer: Self-pay | Admitting: Pain Medicine

## 2013-05-12 ENCOUNTER — Other Ambulatory Visit: Payer: Self-pay | Admitting: *Deleted

## 2013-05-12 MED ORDER — ALBUTEROL SULFATE (2.5 MG/3ML) 0.083% IN NEBU: 2.5000 mg | INHALATION_SOLUTION | Freq: Four times a day (QID) | RESPIRATORY_TRACT | Status: DC | PRN

## 2013-05-26 ENCOUNTER — Other Ambulatory Visit: Payer: Self-pay | Admitting: Internal Medicine

## 2013-05-27 ENCOUNTER — Ambulatory Visit: Payer: Self-pay | Admitting: Surgery

## 2013-05-27 HISTORY — PX: APPENDECTOMY: SHX54

## 2013-05-27 LAB — COMPREHENSIVE METABOLIC PANEL
ANION GAP: 5 — AB (ref 7–16)
Albumin: 3.4 g/dL (ref 3.4–5.0)
Alkaline Phosphatase: 100 U/L
BUN: 9 mg/dL (ref 7–18)
Bilirubin,Total: 0.5 mg/dL (ref 0.2–1.0)
CREATININE: 0.59 mg/dL — AB (ref 0.60–1.30)
Calcium, Total: 8.6 mg/dL (ref 8.5–10.1)
Chloride: 102 mmol/L (ref 98–107)
Co2: 29 mmol/L (ref 21–32)
EGFR (African American): >60
EGFR (Non-African Amer.): >60
Glucose: 89 mg/dL (ref 65–99)
OSMOLALITY: 270 (ref 275–301)
Potassium: 3.6 mmol/L (ref 3.5–5.1)
SGOT(AST): 18 U/L (ref 15–37)
SGPT (ALT): 29 U/L (ref 12–78)
Sodium: 136 mmol/L (ref 136–145)
Total Protein: 6.7 g/dL (ref 6.4–8.2)

## 2013-05-27 LAB — CBC WITH DIFFERENTIAL/PLATELET
BASOS PCT: 0.2 %
Basophil #: 0 10*3/uL (ref 0.0–0.1)
EOS ABS: 0.3 10*3/uL (ref 0.0–0.7)
Eosinophil %: 1.5 %
HCT: 43.6 % (ref 35.0–47.0)
HGB: 14.8 g/dL (ref 12.0–16.0)
LYMPHS ABS: 3.7 10*3/uL — AB (ref 1.0–3.6)
LYMPHS PCT: 18.4 %
MCH: 31.3 pg (ref 26.0–34.0)
MCHC: 34 g/dL (ref 32.0–36.0)
MCV: 92 fL (ref 80–100)
MONO ABS: 1.9 x10 3/mm — AB (ref 0.2–0.9)
Monocyte %: 9.3 %
NEUTROS ABS: 14.2 10*3/uL — AB (ref 1.4–6.5)
Neutrophil %: 70.6 %
PLATELETS: 199 10*3/uL (ref 150–440)
RBC: 4.74 10*6/uL (ref 3.80–5.20)
RDW: 12.3 % (ref 11.5–14.5)
WBC: 20 10*3/uL — AB (ref 3.6–11.0)

## 2013-05-27 LAB — LIPASE, BLOOD: Lipase: 97 U/L (ref 73–393)

## 2013-05-27 LAB — URINALYSIS, COMPLETE
BILIRUBIN, UR: NEGATIVE
Blood: NEGATIVE
GLUCOSE, UR: NEGATIVE mg/dL (ref 0–75)
LEUKOCYTE ESTERASE: NEGATIVE
NITRITE: NEGATIVE
Ph: 5 (ref 4.5–8.0)
Protein: NEGATIVE
RBC,UR: 1 /HPF (ref 0–5)
SPECIFIC GRAVITY: 1.019 (ref 1.003–1.030)
WBC UR: 1 /HPF (ref 0–5)

## 2013-05-28 ENCOUNTER — Ambulatory Visit: Payer: Self-pay | Admitting: Gastroenterology

## 2013-05-28 LAB — URINALYSIS, COMPLETE
BLOOD: NEGATIVE
Bacteria: NONE SEEN
Bilirubin,UR: NEGATIVE
GLUCOSE, UR: NEGATIVE mg/dL (ref 0–75)
Ketone: NEGATIVE
LEUKOCYTE ESTERASE: NEGATIVE
NITRITE: NEGATIVE
Ph: 7 (ref 4.5–8.0)
Protein: NEGATIVE
RBC,UR: NONE SEEN /HPF (ref 0–5)
Specific Gravity: 1.003 (ref 1.003–1.030)
Squamous Epithelial: 1
WBC UR: <1 /HPF (ref 0–5)

## 2013-05-31 LAB — PATHOLOGY REPORT

## 2013-06-01 ENCOUNTER — Ambulatory Visit (HOSPITAL_COMMUNITY): Payer: Self-pay | Admitting: Psychiatry

## 2013-06-02 ENCOUNTER — Other Ambulatory Visit: Payer: Self-pay | Admitting: Surgery

## 2013-06-02 LAB — CBC WITH DIFFERENTIAL/PLATELET
Basophil #: 0.1 10*3/uL (ref 0.0–0.1)
Basophil %: 0.3 %
EOS PCT: 6.1 %
Eosinophil #: 1.2 10*3/uL — ABNORMAL HIGH (ref 0.0–0.7)
HCT: 47.6 % — AB (ref 35.0–47.0)
HGB: 15.9 g/dL (ref 12.0–16.0)
LYMPHS ABS: 2.7 10*3/uL (ref 1.0–3.6)
LYMPHS PCT: 14.3 %
MCH: 31.3 pg (ref 26.0–34.0)
MCHC: 33.5 g/dL (ref 32.0–36.0)
MCV: 93 fL (ref 80–100)
MONO ABS: 1.7 x10 3/mm — AB (ref 0.2–0.9)
MONOS PCT: 9 %
NEUTROS ABS: 13.3 10*3/uL — AB (ref 1.4–6.5)
Neutrophil %: 70.3 %
Platelet: 321 10*3/uL (ref 150–440)
RBC: 5.1 10*6/uL (ref 3.80–5.20)
RDW: 12.6 % (ref 11.5–14.5)
WBC: 19 10*3/uL — ABNORMAL HIGH (ref 3.6–11.0)

## 2013-06-02 LAB — URINALYSIS, COMPLETE
BILIRUBIN, UR: NEGATIVE
Blood: NEGATIVE
Glucose,UR: NEGATIVE mg/dL (ref 0–75)
Ketone: NEGATIVE
Leukocyte Esterase: NEGATIVE
NITRITE: NEGATIVE
Ph: 7 (ref 4.5–8.0)
Protein: NEGATIVE
RBC,UR: 1 /HPF (ref 0–5)
Specific Gravity: 1.003 (ref 1.003–1.030)

## 2013-06-02 LAB — BASIC METABOLIC PANEL
Anion Gap: 5 — ABNORMAL LOW (ref 7–16)
BUN: 8 mg/dL (ref 7–18)
CHLORIDE: 104 mmol/L (ref 98–107)
Calcium, Total: 9.1 mg/dL (ref 8.5–10.1)
Co2: 27 mmol/L (ref 21–32)
Creatinine: 0.81 mg/dL (ref 0.60–1.30)
EGFR (African American): >60
EGFR (Non-African Amer.): >60
Glucose: 115 mg/dL — ABNORMAL HIGH (ref 65–99)
Osmolality: 271 (ref 275–301)
POTASSIUM: 4.2 mmol/L (ref 3.5–5.1)
Sodium: 136 mmol/L (ref 136–145)

## 2013-06-03 ENCOUNTER — Other Ambulatory Visit: Payer: Self-pay | Admitting: Internal Medicine

## 2013-06-03 ENCOUNTER — Telehealth (HOSPITAL_COMMUNITY): Payer: Self-pay | Admitting: *Deleted

## 2013-06-03 NOTE — Telephone Encounter (Signed)
Left message requesting call back regarding Nortriptyline Contacted patient: States surgeon told her to stop taking Nortriptyline when she was discharged from the [facility] after appendix ruptured/surgery.Per pharmacist, ok for her to take with the anitbiotics she was given.Does Dr. Adele Schilder want her to take or not? Per Dr.arfeen, continue Nortriptyline as long as pharmacist says it is okay with antibiotics/mediations prescribed by surgeon.

## 2013-06-04 ENCOUNTER — Ambulatory Visit (HOSPITAL_COMMUNITY): Payer: Self-pay | Admitting: Psychiatry

## 2013-06-05 ENCOUNTER — Other Ambulatory Visit (HOSPITAL_COMMUNITY): Payer: Self-pay | Admitting: Psychiatry

## 2013-06-07 ENCOUNTER — Ambulatory Visit: Payer: Self-pay | Admitting: Surgery

## 2013-06-07 ENCOUNTER — Other Ambulatory Visit (HOSPITAL_COMMUNITY): Payer: Self-pay | Admitting: Psychiatry

## 2013-06-08 ENCOUNTER — Ambulatory Visit (INDEPENDENT_AMBULATORY_CARE_PROVIDER_SITE_OTHER): Payer: Self-pay | Admitting: Psychiatry

## 2013-06-08 ENCOUNTER — Encounter (HOSPITAL_COMMUNITY): Payer: Self-pay | Admitting: Psychiatry

## 2013-06-08 VITALS — BP 119/83 | HR 93 | Ht 65.0 in | Wt 194.2 lb

## 2013-06-08 DIAGNOSIS — F988 Other specified behavioral and emotional disorders with onset usually occurring in childhood and adolescence: Secondary | ICD-10-CM

## 2013-06-08 DIAGNOSIS — F319 Bipolar disorder, unspecified: Secondary | ICD-10-CM

## 2013-06-08 MED ORDER — HYDROXYZINE PAMOATE 100 MG PO CAPS: 100.0000 mg | ORAL_CAPSULE | Freq: Every day | ORAL | Status: DC

## 2013-06-08 MED ORDER — BENZTROPINE MESYLATE 1 MG PO TABS: 1.0000 mg | ORAL_TABLET | Freq: Every day | ORAL | Status: DC

## 2013-06-08 MED ORDER — ARIPIPRAZOLE 20 MG PO TABS: ORAL_TABLET | ORAL | Status: DC

## 2013-06-08 MED ORDER — NORTRIPTYLINE HCL 75 MG PO CAPS: 75.0000 mg | ORAL_CAPSULE | Freq: Every day | ORAL | Status: DC

## 2013-06-08 MED ORDER — DIAZEPAM 5 MG PO TABS: 5.0000 mg | ORAL_TABLET | Freq: Three times a day (TID) | ORAL | Status: DC | PRN

## 2013-06-08 MED ORDER — LISDEXAMFETAMINE DIMESYLATE 30 MG PO CAPS: 30.0000 mg | ORAL_CAPSULE | ORAL | Status: DC

## 2013-06-08 NOTE — Progress Notes (Signed)
[facility] [phone] Progress Note  [name] [id number] 43 y.o.  06/08/2013 9:42 AM  Chief Complaint:  I was recently released from the hospital after surgery.  I was unable to lower Cymbalta because it is giving me significant withdrawal symptoms.      History of Present Illness:  [name] came for her followup appointment.   She was admitted to [facility] for appendicitis.  She's going off the surgery.  She is taking narcotic pain medication.  She tried to lower her Cymbalta but she has withdrawal symptoms and she is taking 30 mg twice a day which is prescribed by her primary care physician.  Patient remains very anxious but overall her depression is improved from the past.  She still has racing thoughts and insomnia she also reported more good days.  She is taking multiple medications for her psychiatric illness.  She still have low energy but overall her motivation and socialization is improved from the past.  She is taking nortriptyline 75 mg at bedtime it is helping her insomnia anxiety and depression.  He denies any recent crying spells.  Her headaches is also improved in the past.  She denies any severe agitation or any hallucination.  She start seeing therapist but she does not remember the name of a therapist.  She liked increase Vistaril is helping her nervousness.  Patient denies any tremors or shakes.  She is not drinking or using any illegal substances.  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  She still living with her mother.  She reported her husband is very supportive.  Her husband works out of town and visits on and off.  Suicidal Ideation: No Plan Formed: No Patient has means to carry out plan: No  Homicidal Ideation: No Plan Formed: No Patient has means to carry out plan: No  Past Psychiatric History/Hospitalization(s) Patient endorsed history of mood swings anger and irritability since childhood.  She is taking medications  for ADD since 2005 however she never had formally tested for ADD.  In the past she had tried Focalin, Vyvanse which worked very well for her until she lost her insurance.  She was seeing Eartha Inch from 2011-2013 for the management of bipolar disorder and ADD.  She did prescribe lithium which makes patient careless, doxepin which helped her depression and quetiapine but patient did not see any improvement.  She also tried temazepam, Ambien which worked for only a short time.  She has given Valium with good response .  Patient denies any history of suicidal attempt but admitted having a strong history of suicidal ideation last year and she was seen in the emergency room however discharge next day.  Patient was never admitted for inpatient services.  Patient denies any history of paranoia , psychosis or any hallucination but admitted history of severe mood swings irritability impulsive behavior , agitation and crying spells.   Patient endorses history of  Impulsive buying, spending money and panic attack .   Anxiety: Yes Bipolar Disorder: Yes Depression: Yes Mania: Yes Psychosis: No Schizophrenia: No Personality Disorder: No Hospitalization for psychiatric illness: No History of Electroconvulsive Shock Therapy: No Prior Suicide Attempts: No  Medical History; Patient has multiple medical problems.  Her primary care physician is Dr. Lovena Le. She has hypertension, asthma, GERD, eczema, cystitis, vitamin D deficiency, chronic pain, chronic fatigue, urinary incontinence , migraine headaches, hyperlipidemia and narcolepsy.  Recently she had surgery for appendicitis  Review of Systems:  Psychiatric: Agitation: No Hallucination: No Depressed Mood: Yes Insomnia: No Hypersomnia: No Altered Concentration: No Feels Worthless: No Grandiose Ideas: No Belief In Special Powers: No New/Increased Substance Abuse: No Compulsions: No  Neurologic: Headache: Yes Seizure: No Paresthesias: No  Outpatient  Encounter Prescriptions as of 06/08/2013  Medication Sig  . albuterol (PROAIR HFA) 108 (90 BASE) MCG/ACT inhaler Inhale 2 puffs into the lungs every 4 (four) hours as needed. For shortness of breath.  [name] albuterol (PROVENTIL) (2.5 MG/3ML) 0.083% nebulizer solution Take 3 mLs (2.5 mg total) by nebulization every 6 (six) hours as needed. For shortness of breath.  . ARIPiprazole (ABILIFY) 20 MG tablet Take one tablet by mouth one time daily  . benztropine (COGENTIN) 1 MG tablet Take 1 tablet (1 mg total) by mouth at bedtime.  . diazepam (VALIUM) 5 MG tablet Take 1 tablet (5 mg total) by mouth 3 (three) times daily as needed for anxiety.  . diclofenac (VOLTAREN) 75 MG EC tablet TAKE ONE TABLET BY MOUTH TWICE A DAY AS NEEDED FOR PAIN   . fluorometholone (FML) 0.1 % ophthalmic suspension   . hydrOXYzine (VISTARIL) 100 MG capsule Take 1 capsule (100 mg total) by mouth at bedtime.  [name] lisdexamfetamine (VYVANSE) 30 MG capsule Take 1 capsule (30 mg total) by mouth every morning.  . loratadine (CLARITIN) 10 MG tablet Take 1 tablet (10 mg total) by mouth daily.  . metoprolol tartrate (LOPRESSOR) 25 MG tablet Take 1 tablet (25 mg total) by mouth 2 (two) times daily.  . nortriptyline (PAMELOR) 75 MG capsule Take 1 capsule (75 mg total) by mouth at bedtime.  [name] omeprazole (PRILOSEC) 20 MG capsule TAKE ONE CAPSULE BY MOUTH ONE TIME DAILY   . pantoprazole (PROTONIX) 40 MG tablet Take 1 tablet (40 mg total) by mouth daily.  . RESTASIS 0.05 % ophthalmic emulsion   . rizatriptan (MAXALT-MLT) 10 MG disintegrating tablet Take 1 tablet (10 mg total) by mouth as needed for migraine. May repeat in 2 hours if needed, maximum 2 tabs a day  . solifenacin (VESICARE) 10 MG tablet Take 0.5 tablets (5 mg total) by mouth daily.  [name] triamcinolone ointment (KENALOG) 0.1 % Apply 1 application topically 2 (two) times daily.  . [DISCONTINUED] ARIPiprazole (ABILIFY) 20 MG tablet Take one tablet by mouth one time daily  . [DISCONTINUED]  benztropine (COGENTIN) 1 MG tablet Take 1 tablet (1 mg total) by mouth at bedtime.  . [DISCONTINUED] diazepam (VALIUM) 5 MG tablet Take 1 tablet (5 mg total) by mouth 3 (three) times daily as needed for anxiety.  . [DISCONTINUED] gabapentin (NEURONTIN) 300 MG capsule Take one capsule by mouth three times daily  . [DISCONTINUED] hydrOXYzine (VISTARIL) 100 MG capsule Take 1 capsule (100 mg total) by mouth at bedtime.  . [DISCONTINUED] hydrOXYzine (VISTARIL) 50 MG capsule TAKE ONE CAPSULE BY MOUTH AT BEDTIME  . [DISCONTINUED] lisdexamfetamine (VYVANSE) 30 MG capsule Take 1 capsule (30 mg total) by mouth every morning.  . [DISCONTINUED] lisdexamfetamine (VYVANSE) 30 MG capsule Take 1 capsule (30 mg total) by mouth every morning.  . [DISCONTINUED] nortriptyline (PAMELOR) 75 MG capsule Take 1 capsule (75 mg total) by mouth at bedtime.  . [DISCONTINUED] ondansetron (ZOFRAN) 4 MG tablet Take 1 tablet by mouth every 8 hours as needed for nausea and vomiting  . [DISCONTINUED] promethazine (PHENERGAN) 12.5 MG tablet Take 1 tablet (12.5 mg total) by mouth every 6 (six) hours as needed for nausea or vomiting.  . [DISCONTINUED] traMADol (ULTRAM) 50 MG tablet Take 1-2 tablets (  50-100 mg total) by mouth 2 (two) times daily as needed for pain.    No results found for this or any previous visit (from the past 2160 hour(s)).  Physical Exam: Constitutional:  BP 119/83  Pulse 93  Ht 5\' 5"  (1.651 m)  Wt 194 lb 3.2 oz (88.089 kg)  BMI 32.32 kg/m2  Musculoskeletal: Strength & Muscle Tone: within normal limits Gait & Station: normal Patient leans: N/A  Mental Status Examination;   Patient is casually dressed and fairly groomed.  She appears anxious but cooperative.  Her speech is clear and coherent.  Her thought processes fast but logical.  Her attention and concentration is fair.  She described her mood anxious and her affect is mood appropriate.  She maintained fair eye contact.  Her fund of knowledge is  adequate.  There were no tremors or shakes.  Her attention and concentration is fair.  There were no delusions, paranoia or any obsession.  She denies any active or passive suicidal thoughts or homicidal thoughts.  There were no flight of ideas or any loose association present at this time.  She is alert and oriented x3.  Her insight judgment and impulse control is okay.     Assessment: Axis I:  Bipolar disorder NOS, attention deficit disorder by history   Axis II:  Deferred   Axis III:  Past Medical History  Diagnosis Date  . Attention deficit disorder without mention of hyperactivity   . Anxiety state, unspecified   . Pain in joint, site unspecified   . Unspecified asthma(493.90)   . Migraine without aura, without mention of intractable migraine without mention of status migrainosus   . Depressive disorder, not elsewhere classified   . Dysuria   . Family history of diabetes mellitus   . Family history of asthma   . Family history of ischemic heart disease     fam h/o premature cardiac disease   . Female stress incontinence   . Esophageal reflux   . Unspecified essential hypertension   . Persistent disorder of initiating or maintaining sleep   . Insomnia, unspecified   . Pain in limb   . Tobacco use disorder   . Unspecified vitamin D deficiency   . Abnormal weight gain   . Dyslipidemia     hypertriglyceridemia  . Hypertension   . Chronic constipation 04/01/2013    Axis IV:  Moderate    Plan:  We will get [facility] wherere she had recent surgery.  Patient is taking Cymbalta 30 mg twice a day which is prescribed by her primary care physician.  Discussed polypharmacy also taking narcotic pain medication but is given after the surgery.  At this time patient does not report any side effects of medication.  She is feeling improvement in her depression.  I will continue  Abilify 20 mg daily, Cogentin 1 mg at bedtime, Vyvanse 30 mg daily, Valium 5 mg 3 times a day and Vistaril  100 mg at bedtime.  Recommended to see therapist since she started seeing counseling.  We will consider lowering the Cymbalta on her next appointment .  Recommend call us back if she has any question or any concern.  Followup in 2 months. Time spent 25 minutes.  More than 50% of the time spent in psychoeducation, counseling and coordination of care.  Discuss safety plan that anytime having active suicidal thoughts or homicidal thoughts then patient need to call 911 or go to the local emergency room.  Delonna Ney T., MD 06/08/2013

## 2013-06-10 ENCOUNTER — Other Ambulatory Visit: Payer: Self-pay | Admitting: Internal Medicine

## 2013-06-28 ENCOUNTER — Ambulatory Visit: Payer: Self-pay | Admitting: Pain Medicine

## 2013-06-29 ENCOUNTER — Encounter: Payer: Self-pay | Admitting: Internal Medicine

## 2013-06-29 ENCOUNTER — Ambulatory Visit (INDEPENDENT_AMBULATORY_CARE_PROVIDER_SITE_OTHER): Payer: Medicare Other | Admitting: Internal Medicine

## 2013-06-29 VITALS — BP 138/80 | HR 80 | Temp 99.5°F | Resp 16 | Wt 192.0 lb

## 2013-06-29 DIAGNOSIS — I10 Essential (primary) hypertension: Secondary | ICD-10-CM

## 2013-06-29 DIAGNOSIS — R32 Unspecified urinary incontinence: Secondary | ICD-10-CM

## 2013-06-29 DIAGNOSIS — N393 Stress incontinence (female) (male): Secondary | ICD-10-CM

## 2013-06-29 DIAGNOSIS — IMO0002 Reserved for concepts with insufficient information to code with codable children: Secondary | ICD-10-CM | POA: Insufficient documentation

## 2013-06-29 DIAGNOSIS — E559 Vitamin D deficiency, unspecified: Secondary | ICD-10-CM

## 2013-06-29 MED ORDER — MELOXICAM 15 MG PO TABS: 15.0000 mg | ORAL_TABLET | Freq: Every day | ORAL | Status: DC | PRN

## 2013-06-29 NOTE — Assessment & Plan Note (Signed)
GYN consult

## 2013-06-29 NOTE — Assessment & Plan Note (Addendum)
5/15 post appendectomy Likely  Due to adhesions Discussed S/p Cipro+Flagyl x 3 wks  We can try Meloxicam GYN consult

## 2013-06-29 NOTE — Progress Notes (Signed)
Pre visit review using our clinic review tool, if applicable. No additional management support is needed unless otherwise documented below in the visit note. 

## 2013-06-29 NOTE — Progress Notes (Signed)
Subjective:   F/u recent appendectomy 05/27/13. C/o severe pain w/each intercourse...  F/u on bipolar disorder: cont to see Dr Adele Schilder F/u HAs - better Trying to loose wt... [facility] Reece Levy Presents for follow-up visit. The problem has been gradually improving. Symptoms include insomnia and nervous/anxious behavior. Patient reports no confusion, decreased concentration, depressed mood, dizziness, nausea, shortness of breath or suicidal ideas. The severity of symptoms is moderate. The quality of sleep is poor.   Her past medical history is significant for bipolar disorder.    The patient is here to follow up on chronic bipolar depression, anxiety, headaches and chronic moderate fibromyalgia and LBP symptoms partially controlled with medicines, diet and exercise. F/u HTN, asthma.  She is on [employer] disability for her bipolar  Wt Readings from Last 3 Encounters:  06/29/13 192 lb (87.091 kg)  06/08/13 194 lb 3.2 oz (88.089 kg)  04/12/13 194 lb 12.8 oz (88.361 kg)   BP Readings from Last 3 Encounters:  06/29/13 138/80  06/08/13 119/83  04/12/13 124/82       Review of Systems  Constitutional: Positive for fatigue. Negative for chills, activity change, appetite change and unexpected weight change.  HENT: Negative for congestion, mouth sores and sinus pressure.   Eyes: Negative for visual disturbance.  Respiratory: Positive for cough. Negative for chest tightness and shortness of breath.   Gastrointestinal: Negative for nausea.  Genitourinary: Negative for frequency, difficulty urinating and vaginal pain.  Musculoskeletal: Negative for gait problem.  Skin: Negative for pallor and rash.  Neurological: Negative for dizziness and tremors.  Psychiatric/Behavioral: Positive for behavioral problems, sleep disturbance and dysphoric mood. Negative for suicidal ideas, confusion and decreased concentration. The patient is nervous/anxious and has  insomnia.        Objective:   Physical Exam  Constitutional: She appears well-developed. No distress.  obese  HENT:  Head: Normocephalic.  Right Ear: External ear normal.  Left Ear: External ear normal.  Nose: Nose normal.  Mouth/Throat: Oropharynx is clear and moist.  Eyes: Conjunctivae are normal. Pupils are equal, round, and reactive to light. Right eye exhibits no discharge. Left eye exhibits no discharge.  Neck: Normal range of motion. Neck supple. No JVD present. No tracheal deviation present. No thyromegaly present.  Cardiovascular: Normal rate, regular rhythm and normal heart sounds.   Pulmonary/Chest: No stridor. No respiratory distress. She has no wheezes.  Abdominal: Soft. Bowel sounds are normal. She exhibits no distension and no mass. There is no tenderness. There is no rebound and no guarding.  Musculoskeletal: She exhibits no edema and no tenderness.  Lymphadenopathy:    She has no cervical adenopathy.  Neurological: She displays tremor. She displays normal reflexes. No cranial nerve deficit. She exhibits normal muscle tone. Coordination normal.  Skin: No rash noted. No erythema.  Psychiatric: Her behavior is normal. Judgment and thought content normal. Her mood appears not anxious. Thought content is not paranoid. She does not exhibit a depressed mood. She expresses no homicidal and no suicidal ideation.  Looks much better Restless legs   Lab Results  Component Value Date   WBC 9.2 04/02/2012   HGB 15.9* 04/02/2012   HCT 46.2* 04/02/2012   PLT 221.0 04/02/2012   GLUCOSE 102* 12/14/2012   CHOL 217* 02/18/2013   TRIG 202.0* 02/18/2013   HDL 37.60* 02/18/2013   LDLDIRECT 161.1 02/18/2013   LDLCALC 102* 07/29/2011   ALT 34 12/14/2012   AST 24 12/14/2012  NA 137 12/14/2012   K 3.9 12/14/2012   CL 102 12/14/2012   CREATININE 0.6 12/14/2012   BUN 14 12/14/2012   CO2 26 12/14/2012   TSH 2.32 12/14/2012   HGBA1C 5.6 02/18/2013           Assessment & Plan:

## 2013-06-29 NOTE — Assessment & Plan Note (Signed)
Tanned Took Vit D po

## 2013-06-29 NOTE — Assessment & Plan Note (Signed)
Continue with current prescription therapy as reflected on the Med list.  

## 2013-06-30 ENCOUNTER — Telehealth: Payer: Self-pay | Admitting: Internal Medicine

## 2013-06-30 NOTE — Telephone Encounter (Signed)
Relevant patient education assigned to patient using Emmi. ° °

## 2013-07-07 ENCOUNTER — Other Ambulatory Visit (HOSPITAL_COMMUNITY): Payer: Self-pay

## 2013-07-07 ENCOUNTER — Telehealth (HOSPITAL_COMMUNITY): Payer: Self-pay | Admitting: Psychiatry

## 2013-07-07 ENCOUNTER — Other Ambulatory Visit (HOSPITAL_COMMUNITY): Payer: Self-pay | Admitting: Psychiatry

## 2013-07-07 NOTE — Telephone Encounter (Signed)
I returned patient's phone call.  She has made an earlier appointment to see this Probation officer on June 25.  She liked to discuss about her medication on that visit.

## 2013-07-22 ENCOUNTER — Ambulatory Visit (INDEPENDENT_AMBULATORY_CARE_PROVIDER_SITE_OTHER): Payer: Self-pay | Admitting: Psychiatry

## 2013-07-22 ENCOUNTER — Encounter (HOSPITAL_COMMUNITY): Payer: Self-pay | Admitting: Psychiatry

## 2013-07-22 VITALS — BP 140/110 | HR 146 | Ht 65.0 in | Wt 189.8 lb

## 2013-07-22 DIAGNOSIS — F988 Other specified behavioral and emotional disorders with onset usually occurring in childhood and adolescence: Secondary | ICD-10-CM

## 2013-07-22 DIAGNOSIS — F319 Bipolar disorder, unspecified: Secondary | ICD-10-CM

## 2013-07-22 MED ORDER — NORTRIPTYLINE HCL 75 MG PO CAPS: 75.0000 mg | ORAL_CAPSULE | Freq: Every day | ORAL | Status: DC

## 2013-07-22 MED ORDER — DEXMETHYLPHENIDATE HCL 10 MG PO TABS: 10.0000 mg | ORAL_TABLET | Freq: Every day | ORAL | Status: DC

## 2013-07-22 MED ORDER — HYDROXYZINE PAMOATE 100 MG PO CAPS: 100.0000 mg | ORAL_CAPSULE | Freq: Every day | ORAL | Status: DC

## 2013-07-22 MED ORDER — DEXMETHYLPHENIDATE HCL 10 MG PO TABS: 10.0000 mg | ORAL_TABLET | Freq: Two times a day (BID) | ORAL | Status: DC

## 2013-07-22 MED ORDER — BENZTROPINE MESYLATE 1 MG PO TABS: 1.0000 mg | ORAL_TABLET | Freq: Every day | ORAL | Status: DC

## 2013-07-22 MED ORDER — DIAZEPAM 5 MG PO TABS: 5.0000 mg | ORAL_TABLET | Freq: Three times a day (TID) | ORAL | Status: DC | PRN

## 2013-07-22 MED ORDER — ARIPIPRAZOLE 30 MG PO TABS: ORAL_TABLET | ORAL | Status: DC

## 2013-07-22 MED ORDER — HYDROXYZINE PAMOATE 100 MG PO CAPS
100.0000 mg | ORAL_CAPSULE | Freq: Every day | ORAL | Status: DC
Start: 2013-07-22 — End: 2013-07-22

## 2013-07-22 NOTE — Progress Notes (Signed)
[name] [id number] Progress Note  [name] [id number] 43 y.o.  07/22/2013 4:21 PM  Chief Complaint:  I don't take my medicine is working.  I have difficulty multitasking.  I get frustrated irritable and angry.     History of Present Illness:  [name] came for her followup appointment.   She is complaining of increased irritability anger mood swing and difficulty multitasking.  She does not believe Vyvanse working very well.  She wants to go back on Focalin however she is concerned that her insurance may not cover .  She was seen by her primary care physician earlier this month .  She mentioned that she has a lot of anxiety with poor sleep confusion and sadness.  She also endorsed a lot of family issues but she cannot afford counseling .  Patient appears easily irritable angry and tearful.  She is taking multiple medications.  She admitted lately crying spells and sometimes feeling of hopelessness and worthlessness.  She denies any suicidal thoughts or homicidal thoughts.  She denies any hallucination or any paranoia.  She endorse racing thoughts.  She is taking multiple medications for pain and also multiple medications for her psychiatric illness.  She does not want to reduce any of her medication.  She is taking Cymbalta from her primary care physician for chronic pain.  She has no tremors or shakes.  She is not drinking or using any illegal substances.  She is taking nortriptyline, hydroxyzine, Abilify, Valium and Cogentin.  She endorse some time she takes Valium 2 times and sometimes 4 times a day.  Patient lives with her mother.  She reported her husband is supportive however her husband currently out of town and visits on and off.  Her appetite is okay.  Her vitals are stable.  Suicidal Ideation: No Plan Formed: No Patient has means to carry out plan: No  Homicidal Ideation: No Plan Formed: No Patient has means to carry out plan: No  Past Psychiatric  History/Hospitalization(s) Patient endorsed history of mood swings anger and irritability since childhood.  She is taking medications for ADD since 2005 however she never had formally tested for ADD.  In the past she had tried Focalin, Vyvanse which worked very well for her until she lost her insurance.  She was seeing [name] from 2011-2013 for the management of bipolar disorder and ADD.  She did prescribe lithium which makes patient careless, doxepin which helped her depression and quetiapine but patient did not see any improvement.  She also tried temazepam, Ambien which worked for only a short time.  She has given Valium with good response .  Patient denies any history of suicidal attempt but admitted having a strong history of suicidal ideation last year and she was seen in the emergency room however discharge next day.  Patient was never admitted for inpatient services.  Patient denies any history of paranoia , psychosis or any hallucination but admitted history of severe mood swings irritability impulsive behavior , agitation and crying spells.   Patient endorses history of  Impulsive buying, spending money and panic attack .   Anxiety: Yes Bipolar Disorder: Yes Depression: Yes Mania: Yes Psychosis: No Schizophrenia: No Personality Disorder: No Hospitalization for psychiatric illness: No History of Electroconvulsive Shock Therapy: No Prior Suicide Attempts: No  Medical History; Patient has multiple medical problems.  Her primary care physician is Dr. Lovena Le. She has hypertension, asthma, GERD, eczema, cystitis, vitamin D deficiency, chronic pain, chronic fatigue, urinary incontinence , migraine  headaches, hyperlipidemia and narcolepsy.  Recently she had surgery for appendicitis  Review of Systems: Psychiatric: Agitation: No Hallucination: No Depressed Mood: Yes Insomnia: No Hypersomnia: No Altered Concentration: No Feels Worthless: No Grandiose Ideas: No Belief In Special  Powers: No New/Increased Substance Abuse: No Compulsions: No  Neurologic: Headache: Yes Seizure: No Paresthesias: No  Outpatient Encounter Prescriptions as of 07/22/2013  Medication Sig  . ARIPiprazole (ABILIFY) 30 MG tablet Take one tablet by mouth one time daily  . benztropine (COGENTIN) 1 MG tablet Take 1 tablet (1 mg total) by mouth at bedtime.  [name] dexmethylphenidate (FOCALIN) 10 MG tablet Take 1 tablet (10 mg total) by mouth daily.  . diazepam (VALIUM) 5 MG tablet Take 1 tablet (5 mg total) by mouth 3 (three) times daily as needed for anxiety.  . DULoxetine (CYMBALTA) 30 MG capsule   . gabapentin (NEURONTIN) 300 MG capsule   . hydrOXYzine (VISTARIL) 100 MG capsule Take 1 capsule (100 mg total) by mouth at bedtime.  [name] lisdexamfetamine (VYVANSE) 30 MG capsule Take 1 capsule (30 mg total) by mouth every morning.  . meloxicam (MOBIC) 15 MG tablet Take 1 tablet (15 mg total) by mouth daily as needed for pain.  . metoprolol tartrate (LOPRESSOR) 25 MG tablet Take 1 tablet (25 mg total) by mouth 2 (two) times daily.  . nortriptyline (PAMELOR) 75 MG capsule Take 1 capsule (75 mg total) by mouth at bedtime.  [name] oxycodone-acetaminophen (ROXICET) 5-500 MG per tablet Take 1 tablet by mouth every 4 (four) hours as needed for pain.  . pantoprazole (PROTONIX) 40 MG tablet Take 1 tablet (40 mg total) by mouth daily.  . rizatriptan (MAXALT-MLT) 10 MG disintegrating tablet Take 1 tablet (10 mg total) by mouth as needed for migraine. May repeat in 2 hours if needed, maximum 2 tabs a day  . traMADol (ULTRAM) 50 MG tablet   . [DISCONTINUED] albuterol (PROAIR HFA) 108 (90 BASE) MCG/ACT inhaler Inhale 2 puffs into the lungs every 4 (four) hours as needed. For shortness of breath.  . [DISCONTINUED] albuterol (PROVENTIL) (2.5 MG/3ML) 0.083% nebulizer solution Take 3 mLs (2.5 mg total) by nebulization every 6 (six) hours as needed. For shortness of breath.  . [DISCONTINUED] ARIPiprazole (ABILIFY) 20 MG tablet  Take one tablet by mouth one time daily  . [DISCONTINUED] benztropine (COGENTIN) 1 MG tablet Take 1 tablet (1 mg total) by mouth at bedtime.  . [DISCONTINUED] dexmethylphenidate (FOCALIN) 10 MG tablet Take 1 tablet (10 mg total) by mouth 2 (two) times daily.  . [DISCONTINUED] diazepam (VALIUM) 5 MG tablet Take 1 tablet (5 mg total) by mouth 3 (three) times daily as needed for anxiety.  . [DISCONTINUED] fluorometholone (FML) 0.1 % ophthalmic suspension   . [DISCONTINUED] hydrOXYzine (VISTARIL) 100 MG capsule Take 1 capsule (100 mg total) by mouth at bedtime.  . [DISCONTINUED] hydrOXYzine (VISTARIL) 100 MG capsule Take 1 capsule (100 mg total) by mouth at bedtime.  . [DISCONTINUED] loratadine (CLARITIN) 10 MG tablet Take 1 tablet (10 mg total) by mouth daily.  . [DISCONTINUED] nortriptyline (PAMELOR) 75 MG capsule Take 1 capsule (75 mg total) by mouth at bedtime.  . [DISCONTINUED] nortriptyline (PAMELOR) 75 MG capsule Take 1 capsule (75 mg total) by mouth at bedtime.  . [DISCONTINUED] omeprazole (PRILOSEC) 20 MG capsule TAKE ONE CAPSULE BY MOUTH ONE TIME DAILY   . [DISCONTINUED] RESTASIS 0.05 % ophthalmic emulsion   . [DISCONTINUED] solifenacin (VESICARE) 10 MG tablet Take 0.5 tablets (5 mg total) by mouth daily.  . [DISCONTINUED]  triamcinolone ointment (KENALOG) 0.1 % Apply 1 application topically 2 (two) times daily.    No results found for this or any previous visit (from the past 2160 hour(s)).  Physical Exam: Constitutional:  BP 140/110  Pulse 146  Ht 5\' 5"  (1.651 m)  Wt 189 lb 12.8 oz (86.093 kg)  BMI 31.58 kg/m2  Musculoskeletal: Strength & Muscle Tone: within normal limits Gait & Station: normal Patient leans: N/A  Mental Status Examination;   Patient is casually dressed and fairly groomed.  She appears anxious, tearful and emotional.  Her speech is fast.  Her attention and concentration is fair.  Her thought processes fast but logical.  She described her mood anxious and her  affect is mood appropriate.  She maintained fair eye contact.  Her fund of knowledge is adequate.  There were no tremors or shakes.  There were no delusions, paranoia or any obsession.  She denies any active or passive suicidal thoughts or homicidal thoughts.  There were no flight of ideas or any loose association present at this time.  She is alert and oriented x3.  Her insight judgment and impulse control is okay.     Assessment: Axis I:  Bipolar disorder NOS, attention deficit disorder by history   Axis II:  Deferred   Axis III:  Past Medical History  Diagnosis Date  . Attention deficit disorder without mention of hyperactivity   . Anxiety state, unspecified   . Pain in joint, site unspecified   . Unspecified asthma(493.90)   . Migraine without aura, without mention of intractable migraine without mention of status migrainosus   . Depressive disorder, not elsewhere classified   . Dysuria   . Family history of diabetes mellitus   . Family history of asthma   . Family history of ischemic heart disease     fam h/o premature cardiac disease   . Female stress incontinence   . Esophageal reflux   . Unspecified essential hypertension   . Persistent disorder of initiating or maintaining sleep   . Insomnia, unspecified   . Pain in limb   . Tobacco use disorder   . Unspecified vitamin D deficiency   . Abnormal weight gain   . Dyslipidemia     hypertriglyceridemia  . Hypertension   . Chronic constipation 04/01/2013    Axis IV:  Moderate    Plan:  I discussed polypharmacy.  She is resistant to cut down her pain medication.  She wants to try Focalin instead of Vyvanse.  We will try Focalin 10 mg daily.  One more time I encouraged to see a therapist for chronic psychosocial issues and coping skills.  I would also increase Abilify 30 mg to help her mood lability and anger.  Patient does not want to come back sooner than 2 months.  I would continue her Pamelor, Cogentin, Valium and  Vistaril.  Discuss in detail the risks and benefits of medication.  Recommended to call us back if she has any question or any concern.  Followup in 2 months.  Time spent 25 minutes.  More than 50% of the time spent in psychoeducation, counseling and coordination of care.  Discuss safety plan that anytime having active suicidal thoughts or homicidal thoughts then patient need to call 911 or go to the local emergency room.   ARFEEN,SYED T., MD 07/22/2013

## 2013-07-23 ENCOUNTER — Ambulatory Visit (INDEPENDENT_AMBULATORY_CARE_PROVIDER_SITE_OTHER): Payer: Medicare Other | Admitting: Gynecology

## 2013-07-23 ENCOUNTER — Encounter: Payer: Self-pay | Admitting: Gynecology

## 2013-07-23 ENCOUNTER — Other Ambulatory Visit (HOSPITAL_COMMUNITY)
Admission: RE | Admit: 2013-07-23 | Discharge: 2013-07-23 | Disposition: A | Discharge status: Home / Self Care | Payer: Medicare Other | Source: Ambulatory Visit | Attending: Gynecology | Admitting: Gynecology

## 2013-07-23 VITALS — BP 140/100 | Ht 66.0 in | Wt 192.0 lb

## 2013-07-23 DIAGNOSIS — Z1151 Encounter for screening for human papillomavirus (HPV): Secondary | ICD-10-CM | POA: Insufficient documentation

## 2013-07-23 DIAGNOSIS — Z01419 Encounter for gynecological examination (general) (routine) without abnormal findings: Secondary | ICD-10-CM

## 2013-07-23 DIAGNOSIS — N951 Menopausal and female climacteric states: Secondary | ICD-10-CM

## 2013-07-23 DIAGNOSIS — IMO0002 Reserved for concepts with insufficient information to code with codable children: Secondary | ICD-10-CM

## 2013-07-23 NOTE — Progress Notes (Signed)
[name] [birth year] [id number]   History:    43 y.o.  for annual gyn exam who is a new patient to the practice. She was referred for office from her PCP Dr. Mignon Pine as a result of the past several months the patient may complaining of low suprapubic pain and dyspareunia. Patient reports normal Pap smears in the past the last one having been done in 2014. Patient's mother had breast cancer at the age of 51. Patient had a normal mammogram 3D recently and does her monthly breast exam.  Patient has multiple comorbidities please see problem list for details. She recently had been complaining of back discomfort has been receiving steroid injection as a result of her osteoarthritis in her back.  Patient in April of this year had a ruptured appendix. Patient stated that also  In  [facility] she had had laparoscopic supracervical hysterectomy secondary to pelvic pain which resolved until after her appendectomy this year. The patient stated she had had a ruptured appendix. Patient also was complaining having some vasomotor symptoms. And also past history of left ovarian cyst.  Past medical history,surgical history, family history and social history were all reviewed and documented in the EPIC chart.  Gynecologic History No LMP recorded. Patient has had a hysterectomy. Contraception: status post hysterectomy Last Pap: 2014. Results were: normal Last mammogram: 2015. Results were: normal  Obstetric History OB History  Gravida Para Term Preterm AB SAB TAB Ectopic Multiple Living  2 2        2     # Outcome Date GA Lbr Len/2nd Weight Sex Delivery Anes PTL Lv  2 PAR           1 PAR                ROS: A ROS was performed and pertinent positives and negatives are included in the history.  GENERAL: No fevers or chills. HEENT: No change in vision, no earache, sore throat or sinus congestion. NECK: No pain or stiffness. CARDIOVASCULAR: No chest pain or pressure. No palpitations.  PULMONARY: No shortness of breath, cough or wheeze. GASTROINTESTINAL: No abdominal pain, nausea, vomiting or diarrhea, melena or bright red blood per rectum. GENITOURINARY: No urinary frequency, urgency, hesitancy or dysuria. MUSCULOSKELETAL: No joint or muscle pain, no back pain, no recent trauma. DERMATOLOGIC: No rash, no itching, no lesions. ENDOCRINE: No polyuria, polydipsia, no heat or cold intolerance. No recent change in weight. HEMATOLOGICAL: No anemia or easy bruising or bleeding. NEUROLOGIC: No headache, seizures, numbness, tingling or weakness. PSYCHIATRIC: No depression, no loss of interest in normal activity or change in sleep pattern.     Exam: chaperone present  BP 140/100  Ht 5\' 6"  (1.676 m)  Wt 192 lb (87.091 kg)  BMI 31.00 kg/m2  Body mass index is 31 kg/(m^2).  General appearance : Well developed well nourished female. No acute distress HEENT: Neck supple, trachea midline, no carotid bruits, no thyroidmegaly Lungs: Clear to auscultation, no rhonchi or wheezes, or rib retractions  Heart: Regular rate and rhythm, no murmurs or gallops Breast:Examined in sitting and supine position were symmetrical in appearance, no palpable masses or tenderness,  no skin retraction, no nipple inversion, no nipple discharge, no skin discoloration, no axillary or supraclavicular lymphadenopathy Abdomen: no palpable masses or tenderness, no rebound or guarding Extremities: no edema or skin discoloration or tenderness  Pelvic:  Bartholin, Urethra, Skene Glands: Within normal limits  Vagina: No gross lesions or discharge  Cervix: Absent  Uterus  absent  Adnexa  Without masses or tenderness  Anus and perineum  normal   Rectovaginal  normal sphincter tone without palpated masses or tenderness             Hemoccult not indicated     Assessment/Plan:  43 y.o. female for annual exam with main complaint has been dyspareunia since after her appendectomy in April of this year. She was  cleared for general surgical standpoint believe is some discomfort may have been from adhesions. I have requested the patient obtained a copy of the operative note from [facility] where she had a supracervical laparoscopic hysterectomy. She will return back to the office in a few weeks for a pelvic ultrasound. The location of her symptoms may be attributed to interstitial cystitis? As to her vasomotor symptoms we'll check an [facility] today along with her TSH. Pap smear was done today.  Note: This dictation was prepared with  Dragon/digital dictation along withSmart phrase technology. Any transcriptional errors that result from this process are unintentional.   Terrance Mass MD, 7:03 PM 07/23/2013

## 2013-07-23 NOTE — Patient Instructions (Addendum)

## 2013-07-24 LAB — FOLLICLE STIMULATING HORMONE: FSH: 3.5 m[IU]/mL

## 2013-07-24 LAB — TSH: TSH: 1.426 u[IU]/mL (ref 0.350–4.500)

## 2013-07-27 ENCOUNTER — Ambulatory Visit: Payer: Self-pay | Admitting: Pain Medicine

## 2013-07-27 LAB — CYTOLOGY - PAP

## 2013-07-28 ENCOUNTER — Telehealth (HOSPITAL_COMMUNITY): Payer: Self-pay | Admitting: *Deleted

## 2013-07-28 ENCOUNTER — Other Ambulatory Visit (HOSPITAL_COMMUNITY): Payer: Self-pay | Admitting: *Deleted

## 2013-07-28 DIAGNOSIS — F319 Bipolar disorder, unspecified: Secondary | ICD-10-CM

## 2013-07-28 MED ORDER — DIAZEPAM 5 MG PO TABS: 5.0000 mg | ORAL_TABLET | Freq: Three times a day (TID) | ORAL | Status: DC | PRN

## 2013-07-28 NOTE — Telephone Encounter (Signed)
Patient left message 6/26 @ 0833: VM received 6/29 @ 0840:Saw Dr. Adele Schilder 6/25.Gave her Rx for Focalin, but not for Valium. Phoned pt - left message:Advised pt that per chart, RX for Valium printed by MD at time of appt 6/24. Requested she check papers given to her at end of appt to see if RX locathered with them. Patient left VM: Looked thru everything and also called pharmacy to see if she left RX for Valium  with them. She does not have it. they do not have it. Contacted patient: Informed pt that as RX was already given, will contact Dr. Adele Schilder and ask if he will give duplicate prescription.Informed pt that MD not in office for 3 days, will ask when he returns. Pt states that is okay, she cannot fill until next week anyway.  Patient left VM 6/30 @ 0931:Knows focalin Rx printed, but what about Nortriptyline,Cogentin and vistaril? Also tell MD that Focalin wears off by noon - takes at 5 am when she gets up. Contacted patient: Nortriptyline,Cogentin and Vistaril all sent to pharmacy by escript on day of appt. Informed patent that she will be advised when  D can review her request for duplicate RX for Valium. Pt verbalized understanding.

## 2013-07-28 NOTE — Telephone Encounter (Signed)
Patient cannot locate written RX for Valium from 07/21/13 appt. Informed Dr. Adele Schilder of this. Received following orders: Contact pharmacy. May give duplicate of Valium RX given on 07/21/13, but only for 30 day supply. Request pharmacy not fill early and ask if they can check [place] Controlled Substance Registry to see when pt last filled any RX for Valium. Per Mickel Baas at [facility], pt last brought in new RX for Valium on 5/12, with one refill.First fill was 5/12, refilled on 07/08/13. Per Mickel Baas, no further RX fills noted on [place] Controlled Substance Registry for this patient.

## 2013-08-02 ENCOUNTER — Ambulatory Visit: Payer: Self-pay | Admitting: Pain Medicine

## 2013-08-04 ENCOUNTER — Ambulatory Visit (INDEPENDENT_AMBULATORY_CARE_PROVIDER_SITE_OTHER): Payer: Medicare Other

## 2013-08-04 ENCOUNTER — Other Ambulatory Visit: Payer: Self-pay | Admitting: *Deleted

## 2013-08-04 ENCOUNTER — Other Ambulatory Visit: Payer: Self-pay | Admitting: Gynecology

## 2013-08-04 ENCOUNTER — Ambulatory Visit (INDEPENDENT_AMBULATORY_CARE_PROVIDER_SITE_OTHER): Payer: Medicare Other | Admitting: Gynecology

## 2013-08-04 ENCOUNTER — Encounter: Payer: Self-pay | Admitting: Gynecology

## 2013-08-04 VITALS — BP 128/86

## 2013-08-04 DIAGNOSIS — N949 Unspecified condition associated with female genital organs and menstrual cycle: Secondary | ICD-10-CM

## 2013-08-04 DIAGNOSIS — N83209 Unspecified ovarian cyst, unspecified side: Secondary | ICD-10-CM

## 2013-08-04 DIAGNOSIS — N839 Noninflammatory disorder of ovary, fallopian tube and broad ligament, unspecified: Secondary | ICD-10-CM

## 2013-08-04 DIAGNOSIS — IMO0002 Reserved for concepts with insufficient information to code with codable children: Secondary | ICD-10-CM

## 2013-08-04 DIAGNOSIS — N83201 Unspecified ovarian cyst, right side: Secondary | ICD-10-CM | POA: Insufficient documentation

## 2013-08-04 DIAGNOSIS — G8929 Other chronic pain: Secondary | ICD-10-CM

## 2013-08-04 DIAGNOSIS — R102 Pelvic and perineal pain: Principal | ICD-10-CM

## 2013-08-04 MED ORDER — MEDROXYPROGESTERONE ACETATE 150 MG/ML IM SUSP
150.0000 mg | Freq: Once | INTRAMUSCULAR | Status: AC
  Administered 2013-08-04: 150 mg via INTRAMUSCULAR

## 2013-08-04 MED ORDER — GABAPENTIN 300 MG PO CAPS: 300.0000 mg | ORAL_CAPSULE | Freq: Three times a day (TID) | ORAL | Status: DC

## 2013-08-04 NOTE — Patient Instructions (Addendum)
Ovarian Cyst An ovarian cyst is a fluid-filled sac that forms on an ovary. The ovaries are small organs that produce eggs in women. Various types of cysts can form on the ovaries. Most are not cancerous. Many do not cause problems, and they often go away on their own. Some may cause symptoms and require treatment. Common types of ovarian cysts include:  Functional cysts--These cysts may occur every month during the menstrual cycle. This is normal. The cysts usually go away with the next menstrual cycle if the woman does not get pregnant. Usually, there are no symptoms with a functional cyst.  Endometrioma cysts--These cysts form from the tissue that lines the uterus. They are also called "chocolate cysts" because they become filled with blood that turns brown. This type of cyst can cause pain in the lower abdomen during intercourse and with your menstrual period.  Cystadenoma cysts--This type develops from the cells on the outside of the ovary. These cysts can get very big and cause lower abdomen pain and pain with intercourse. This type of cyst can twist on itself, cut off its blood supply, and cause severe pain. It can also easily rupture and cause a lot of pain.  Dermoid cysts--This type of cyst is sometimes found in both ovaries. These cysts may contain different kinds of body tissue, such as skin, teeth, hair, or cartilage. They usually do not cause symptoms unless they get very big.  Theca lutein cysts--These cysts occur when too much of a certain hormone (human chorionic gonadotropin) is produced and overstimulates the ovaries to produce an egg. This is most common after procedures used to assist with the conception of a baby (in vitro fertilization). CAUSES   Fertility drugs can cause a condition in which multiple large cysts are formed on the ovaries. This is called ovarian hyperstimulation syndrome.  A condition called polycystic ovary syndrome can cause hormonal imbalances that can lead to  nonfunctional ovarian cysts. SIGNS AND SYMPTOMS  Many ovarian cysts do not cause symptoms. If symptoms are present, they may include:  Pelvic pain or pressure.  Pain in the lower abdomen.  Pain during sexual intercourse.  Increasing girth (swelling) of the abdomen.  Abnormal menstrual periods.  Increasing pain with menstrual periods.  Stopping having menstrual periods without being pregnant. DIAGNOSIS  These cysts are commonly found during a routine or annual pelvic exam. Tests may be ordered to find out more about the cyst. These tests may include:  Ultrasound.  X-ray of the pelvis.  CT scan.  MRI.  Blood tests. TREATMENT  Many ovarian cysts go away on their own without treatment. Your health care provider may want to check your cyst regularly for 2-3 months to see if it changes. For women in menopause, it is particularly important to monitor a cyst closely because of the higher rate of ovarian cancer in menopausal women. When treatment is needed, it may include any of the following:  A procedure to drain the cyst (aspiration). This may be done using a long needle and ultrasound. It can also be done through a laparoscopic procedure. This involves using a thin, lighted tube with a tiny camera on the end (laparoscope) inserted through a small incision.  Surgery to remove the whole cyst. This may be done using laparoscopic surgery or an open surgery involving a larger incision in the lower abdomen.  Hormone treatment or birth control pills. These methods are sometimes used to help dissolve a cyst. HOME CARE INSTRUCTIONS   Only take over-the-counter   or prescription medicines as directed by your health care provider.  Follow up with your health care provider as directed.  Get regular pelvic exams and Pap tests. SEEK MEDICAL CARE IF:   Your periods are late, irregular, or painful, or they stop.  Your pelvic pain or abdominal pain does not go away.  Your abdomen becomes  larger or swollen.  You have pressure on your bladder or trouble emptying your bladder completely.  You have pain during sexual intercourse.  You have feelings of fullness, pressure, or discomfort in your stomach.  You lose weight for no apparent reason.  You feel generally ill.  You become constipated.  You lose your appetite.  You develop acne.  You have an increase in body and facial hair.  You are gaining weight, without changing your exercise and eating habits.  You think you are pregnant. SEEK IMMEDIATE MEDICAL CARE IF:   You have increasing abdominal pain.  You feel sick to your stomach (nauseous), and you throw up (vomit).  You develop a fever that comes on suddenly.  You have abdominal pain during a bowel movement.  Your menstrual periods become heavier than usual. MAKE SURE YOU:  Understand these instructions.  Will watch your condition.  Will get help right away if you are not doing well or get worse. Document Released: 01/14/2005 Document Revised: 01/19/2013 Document Reviewed: 09/21/2012 [facility] Patient Information 2015 [place], [place]. This information is not intended to replace advice given to you by your health care provider. Make sure you discuss any questions you have with your health care provider. Endometriosis Endometriosis is a condition in which the tissue that lines the uterus (endometrium) grows outside of its normal location. The tissue may grow in many locations close to the uterus, but it commonly grows on the ovaries, fallopian tubes, vagina, or bowel. Because the uterus expels, or sheds, its lining every menstrual cycle, there is bleeding wherever the endometrial tissue is located. This can cause pain because blood is irritating to tissues not normally exposed to it.  CAUSES  The cause of endometriosis is not known.  SIGNS AND SYMPTOMS  Often, there are no symptoms. When symptoms are present, they can vary with the location of the  displaced tissue. Various symptoms can occur at different times. Although symptoms occur mainly during a woman's menstrual period, they can also occur midcycle and usually stop with menopause. Some people may go months with no symptoms at all. Symptoms may include:   Back or abdominal pain.   Heavier bleeding during periods.   Pain during intercourse.   Painful bowel movements.   Infertility. DIAGNOSIS  Your health care provider will do a physical exam and ask about your symptoms. Various tests may be done, such as:   Blood tests and urine tests. These are done to help rule out other problems.   Ultrasound. This test is done to look for abnormal tissue.   An X-ray of the lower bowel (barium enema).  Laparoscopy. In this procedure, a thin, lighted tube with a tiny camera on the end (laparoscope) is inserted into your abdomen. This helps your health care provider look for abnormal tissue to confirm the diagnosis. The health care provider may also remove a small piece of tissue (biopsy) from any abnormal tissue found. This tissue sample can then be sent to a lab so it can be looked at under a microscope. TREATMENT  Treatment will vary and may include:   Medicines to relieve pain. Nonsteroidal anti-inflammatory drugs (NSAIDs)  are a type of pain medicine that can help to relieve the pain caused by endometriosis.  Hormonal therapy. When using hormonal therapy, periods are eliminated. This eliminates the monthly exposure to blood by the displaced endometrial tissue.   Surgery. Surgery may sometimes be done to remove the abnormal endometrial tissue. In severe cases, surgery may be done to remove the fallopian tubes, uterus, and ovaries (hysterectomy). HOME CARE INSTRUCTIONS   Only take over-the-counter or prescription medicines for pain, discomfort, or fever as directed by your health care provider. Do not take aspirin because it may increase bleeding when you are not on hormonal  therapy.   Avoid activities that produce pain, including sexual activity. SEEK MEDICAL CARE IF:  You have pelvic pain before, after, or during your periods.  You have pelvic pain between periods that gets worse during your period.  You have pelvic pain during or after sex.  You have pelvic pain with bowel movements or urination, especially during your period.  You have problems getting pregnant. SEEK IMMEDIATE MEDICAL CARE IF:   Your pain is severe and is not responding to pain medicine.   You have severe nausea and vomiting, or you cannot keep foods down.   You have pain that is limited to the right lower part of your abdomen.   You have swelling or increasing pain in your abdomen.   You see blood in your stool.   You have a fever or persistent symptoms for more than 2-3 days.   You have a fever and your symptoms suddenly get worse. MAKE SURE YOU:   Understand these instructions.  Will watch your condition.  Will get help right away if you are not doing well or get worse. Document Released: 01/12/2000 Document Revised: 11/04/2012 Document Reviewed: 09/11/2012 [facility] Patient Information 2015 [place], [place]. This information is not intended to replace advice given to you by your health care provider. Make sure you discuss any questions you have with your health care provider. CA-125 Tumor Marker CA 125 is a tumor marker that is used to help monitor the course of ovarian or endometrial cancer. PREPARATION FOR TEST No preparation is necessary. NORMAL FINDINGS Adults: 0-35 units/mL (0-35 kilounits)/L Ranges for normal findings may vary among different laboratories and hospitals. You should always check with your doctor after having lab work or other tests done to discuss the meaning of your test results and whether your values are considered within normal limits. MEANING OF TEST  Your caregiver will go over the test results with you and discuss the importance and  meaning of your results, as well as treatment options and the need for additional tests if necessary. OBTAINING THE TEST RESULTS It is your responsibility to obtain your test results. Ask the lab or department performing the test when and how you will get your results. Document Released: 02/06/2004 Document Revised: 04/08/2011 Document Reviewed: 12/23/2007 [facility] Patient Information 2015 [place], [place]. This information is not intended to replace advice given to you by your health care provider. Make sure you discuss any questions you have with your health care provider.

## 2013-08-04 NOTE — Progress Notes (Signed)
   43 year old patient who presented to the office today to discuss her ultrasound. Patient was seen in the office in June of this year as a new patient and her history is as follows:  She was referred for office from her PCP Dr. Mignon Pine as a result of the past several months the patient may complaining of low suprapubic pain and dyspareunia. Patient reports normal Pap smears in the past . Pap smear last month was normal.  Patient has multiple comorbidities please see problem list for details. She recently had been complaining of back discomfort has been receiving steroid injection as a result of her osteoarthritis in her back.  Patient in April of this year had a ruptured appendix. Patient stated that also In [facility] she had had laparoscopic supracervical hysterectomy secondary to pelvic pain which resolved until after her appendectomy this year. The patient stated she had had a ruptured appendix. Patient also was complaining having some vasomotor symptoms. And also past history of left ovarian cyst.  Recent TSH, FSH and Pap smear were normal.  Ultrasound today: Absent uterus. Right ovary rim of tissue seen with thin wall a vascular cyst measuring 42 x 50 x 44 mm average size 4.5 mm. Echo-free. Left ovary and echo-free cyst measuring 19 x 17 mm with internal low level echoes. Negative fluid in the cul-de-sac.  Patient brought records from her 2 previous surgeries which I evaluated and will be scanned and placed in her electronic medical records.  August 2007 patient had a laparoscopic supracervical hysterectomy as a result of chronic pelvic pain menorrhagia and dysmenorrhea. Pathology report demonstrated uterus without cervix benign secretory  endometrium and evidence of adenomyosis.  April 2015: Laparoscopic appendectomy as a result of acute appendicitis with ruptured and gangrene and noted confirmed pathologically. Prior to her appendectomy patient had an abdominal pelvic CT  scan which demonstrated a normal uterus and adnexal region and normal-appearing liver, gallbladder, pancreas, spleen, adrenal glands and kidneys. There was no evidence of hydronephrosis or soft tissue masses or lymphadenopathy. But positive for acute appendicitis and no evidence of abscess or bowel obstruction was documented.  Assessment/plan: Patient with dyspareunia suprapubic discomfort now with apparent simple-appearing right ovarian cyst we are going to give her a shot of Depo-Provera 150 mg IM and she will return back in 3 months for followup ultrasound. If she has underlying endometriosis this may help her symptoms. We discussed about continue the Depo-Provera q. 3 months of this cyst resolving her symptoms improved. If not we may need to resort to diagnostic laparoscopy and laparoscopic cystectomy. We will check a CA 125 today. Its limitations were discussed. Literature and information on endometriosis and ovarian cyst was provided.

## 2013-08-05 ENCOUNTER — Other Ambulatory Visit: Payer: Self-pay | Admitting: Internal Medicine

## 2013-08-05 ENCOUNTER — Encounter: Payer: Self-pay | Admitting: Internal Medicine

## 2013-08-05 ENCOUNTER — Other Ambulatory Visit: Payer: Medicare Other

## 2013-08-05 DIAGNOSIS — N83209 Unspecified ovarian cyst, unspecified side: Secondary | ICD-10-CM

## 2013-08-05 MED ORDER — LINACLOTIDE 145 MCG PO CAPS: 145.0000 ug | ORAL_CAPSULE | Freq: Every day | ORAL | Status: DC | PRN

## 2013-08-05 NOTE — Telephone Encounter (Signed)
Pt also called and stated that she is currently taking miralax once a day. Wanting rx for twice a day sent to [facility] or does md want to rx her something else. Pls advise...[name]

## 2013-08-06 LAB — CA 125: CA 125: 13.1 U/mL (ref 0.0–30.2)

## 2013-08-10 ENCOUNTER — Ambulatory Visit (HOSPITAL_COMMUNITY): Payer: Self-pay | Admitting: Psychiatry

## 2013-08-10 ENCOUNTER — Telehealth: Payer: Self-pay

## 2013-08-10 NOTE — Telephone Encounter (Signed)
Received pharmacy rejection stating that insurance will not cover Linzess  without a prior authorization. PA started and faxed to [facility], approval pending insurance.

## 2013-08-12 ENCOUNTER — Telehealth: Payer: Self-pay | Admitting: *Deleted

## 2013-08-12 NOTE — Telephone Encounter (Signed)
Linzess 145 mcg PA is approved 2 per day until 01/27/2014.

## 2013-08-19 ENCOUNTER — Other Ambulatory Visit (HOSPITAL_COMMUNITY): Payer: Self-pay | Admitting: *Deleted

## 2013-08-19 DIAGNOSIS — F909 Attention-deficit hyperactivity disorder, unspecified type: Secondary | ICD-10-CM

## 2013-08-19 MED ORDER — DEXMETHYLPHENIDATE HCL 10 MG PO TABS: 10.0000 mg | ORAL_TABLET | Freq: Every day | ORAL | Status: DC

## 2013-08-23 ENCOUNTER — Telehealth (HOSPITAL_COMMUNITY): Payer: Self-pay

## 2013-08-23 ENCOUNTER — Ambulatory Visit: Payer: Self-pay | Admitting: Pain Medicine

## 2013-08-23 NOTE — Telephone Encounter (Signed)
[name] picked up prescription on 08/23/13 DL #[id number] dlo

## 2013-09-02 ENCOUNTER — Other Ambulatory Visit (HOSPITAL_COMMUNITY): Payer: Self-pay | Admitting: *Deleted

## 2013-09-02 DIAGNOSIS — F319 Bipolar disorder, unspecified: Secondary | ICD-10-CM

## 2013-09-02 MED ORDER — DIAZEPAM 5 MG PO TABS: 5.0000 mg | ORAL_TABLET | Freq: Three times a day (TID) | ORAL | Status: DC | PRN

## 2013-09-02 NOTE — Telephone Encounter (Signed)
Refill of Diazepam given as per Dr. Adele Schilder. Called in to patient pharmacy for 1 month supply.

## 2013-09-09 ENCOUNTER — Ambulatory Visit (INDEPENDENT_AMBULATORY_CARE_PROVIDER_SITE_OTHER): Payer: Medicare Other | Admitting: Gynecology

## 2013-09-09 ENCOUNTER — Encounter: Payer: Self-pay | Admitting: Gynecology

## 2013-09-09 VITALS — BP 132/80

## 2013-09-09 DIAGNOSIS — N390 Urinary tract infection, site not specified: Secondary | ICD-10-CM

## 2013-09-09 DIAGNOSIS — R300 Dysuria: Secondary | ICD-10-CM

## 2013-09-09 DIAGNOSIS — R350 Frequency of micturition: Secondary | ICD-10-CM

## 2013-09-09 LAB — URINALYSIS W MICROSCOPIC + REFLEX CULTURE
BILIRUBIN URINE: NEGATIVE
CASTS: NONE SEEN
Crystals: NONE SEEN
Glucose, UA: NEGATIVE mg/dL
KETONES UR: NEGATIVE mg/dL
NITRITE: NEGATIVE
PH: 6 (ref 5.0–8.0)
Protein, ur: NEGATIVE mg/dL
Specific Gravity, Urine: <1.005 — ABNORMAL LOW (ref 1.005–1.030)
Urobilinogen, UA: 0.2 mg/dL (ref 0.0–1.0)

## 2013-09-09 MED ORDER — PHENAZOPYRIDINE HCL 95 MG PO TABS: 95.0000 mg | ORAL_TABLET | Freq: Three times a day (TID) | ORAL | Status: DC | PRN

## 2013-09-09 MED ORDER — SULFAMETHOXAZOLE-TMP DS 800-160 MG PO TABS: 1.0000 | ORAL_TABLET | Freq: Two times a day (BID) | ORAL | Status: DC

## 2013-09-09 MED ORDER — NITROFURANTOIN MONOHYD MACRO 100 MG PO CAPS: 100.0000 mg | ORAL_CAPSULE | Freq: Two times a day (BID) | ORAL | Status: DC

## 2013-09-09 MED ORDER — FLUCONAZOLE 150 MG PO TABS: 150.0000 mg | ORAL_TABLET | Freq: Once | ORAL | Status: DC

## 2013-09-09 NOTE — Addendum Note (Signed)
Addended by: Thurnell Garbe A on: 09/09/2013 02:43 PM   Modules accepted: Orders

## 2013-09-09 NOTE — Progress Notes (Signed)
   43 year old patient who presented to the office complaining of this urinary infrequency. She stated that approximately 3 days ago she had a temperature as high as 101.8 and had some mild nausea but the temperature has defervesced. She's had some mild low back discomfort. She denies any vaginal discharge. Patient with prior history of supracervical hysterectomy.  Exam: Back: No CVA tenderness Pelvic: Bartholin urethra Skene was within normal limits Vagina: No lesions or discharge Cervix: No lesions or discharge Bimanual exam tenderness in the suprapubic area Adnexa: No palpable masses or tenderness Rectal exam not done  Urinalysis: White blood cell: Too numerous to count RBC:  3-6 Bacteria many  Assessment/plan: Patient with urinary tract infection (cystitis). Patient will be prescribed Septra DS one by mouth twice a day for 7 days. For bladder spasm she will be started on Pyridium 200 mg one tablet 3 times a day for the next 2 days. Since she developed yeast infections after being on antibiotics she will be prescribed Diflucan 150 mg to take when necessary one tablet

## 2013-09-09 NOTE — Patient Instructions (Signed)
Nitrofurantoin tablets or capsules What is this medicine? NITROFURANTOIN (nye troe fyoor AN toyn) is an antibiotic. It is used to treat urinary tract infections. This medicine may be used for other purposes; ask your health care provider or pharmacist if you have questions. COMMON BRAND NAME(S): Macrobid, Macrodantin, Urotoin What should I tell my health care provider before I take this medicine? They need to know if you have any of these conditions: -anemia -diabetes -glucose-6-phosphate dehydrogenase deficiency -kidney disease -liver disease -lung disease -other chronic illness -an unusual or allergic reaction to nitrofurantoin, other antibiotics, other medicines, foods, dyes or preservatives -pregnant or trying to get pregnant -breast-feeding How should I use this medicine? Take this medicine by mouth with a glass of water. Follow the directions on the prescription label. Take this medicine with food or milk. Take your doses at regular intervals. Do not take your medicine more often than directed. Do not stop taking except on your doctor's advice. Talk to your pediatrician regarding the use of this medicine in children. While this drug may be prescribed for selected conditions, precautions do apply. Overdosage: If you think you have taken too much of this medicine contact a poison control center or emergency room at once. NOTE: This medicine is only for you. Do not share this medicine with others. What if I miss a dose? If you miss a dose, take it as soon as you can. If it is almost time for your next dose, take only that dose. Do not take double or extra doses. What may interact with this medicine? -antacids containing magnesium trisilicate -probenecid -quinolone antibiotics like ciprofloxacin, lomefloxacin, norfloxacin and ofloxacin -sulfinpyrazone This list may not describe all possible interactions. Give your health care provider a list of all the medicines, herbs,  non-prescription drugs, or dietary supplements you use. Also tell them if you smoke, drink alcohol, or use illegal drugs. Some items may interact with your medicine. What should I watch for while using this medicine? Tell your doctor or health care professional if your symptoms do not improve or if you get new symptoms. Drink several glasses of water a day. If you are taking this medicine for a long time, visit your doctor for regular checks on your progress. If you are diabetic, you may get a false positive result for sugar in your urine with certain brands of urine tests. Check with your doctor. What side effects may I notice from receiving this medicine? Side effects that you should report to your doctor or health care professional as soon as possible: -allergic reactions like skin rash or hives, swelling of the face, lips, or tongue -chest pain -cough -difficulty breathing -dizziness, drowsiness -fever or infection -joint aches or pains -pale or blue-tinted skin -redness, blistering, peeling or loosening of the skin, including inside the mouth -tingling, burning, pain, or numbness in hands or feet -unusual bleeding or bruising -unusually weak or tired -yellowing of eyes or skin Side effects that usually do not require medical attention (report to your doctor or health care professional if they continue or are bothersome): -dark urine -diarrhea -headache -loss of appetite -nausea or vomiting -temporary hair loss This list may not describe all possible side effects. Call your doctor for medical advice about side effects. You may report side effects to FDA at 1-800-FDA-1088. Where should I keep my medicine? Keep out of the reach of children. Store at room temperature between 15 and 30 degrees C (59 and 86 degrees F). Protect from light. Throw away any unused  medicine after the expiration date. NOTE: This sheet is a summary. It may not cover all possible information. If you have  questions about this medicine, talk to your doctor, pharmacist, or health care provider.  2015, [employer]. (2007-08-05 15:56:47) Urinary Tract Infection Urinary tract infections (UTIs) can develop anywhere along your urinary tract. Your urinary tract is your body's drainage system for removing wastes and extra water. Your urinary tract includes two kidneys, two ureters, a bladder, and a urethra. Your kidneys are a pair of bean-shaped organs. Each kidney is about the size of your fist. They are located below your ribs, one on each side of your spine. CAUSES Infections are caused by microbes, which are microscopic organisms, including fungi, viruses, and bacteria. These organisms are so small that they can only be seen through a microscope. Bacteria are the microbes that most commonly cause UTIs. SYMPTOMS  Symptoms of UTIs may vary by age and gender of the patient and by the location of the infection. Symptoms in young women typically include a frequent and intense urge to urinate and a painful, burning feeling in the bladder or urethra during urination. Older women and men are more likely to be tired, shaky, and weak and have muscle aches and abdominal pain. A fever may mean the infection is in your kidneys. Other symptoms of a kidney infection include pain in your back or sides below the ribs, nausea, and vomiting. DIAGNOSIS To diagnose a UTI, your caregiver will ask you about your symptoms. Your caregiver also will ask to provide a urine sample. The urine sample will be tested for bacteria and white blood cells. White blood cells are made by your body to help fight infection. TREATMENT  Typically, UTIs can be treated with medication. Because most UTIs are caused by a bacterial infection, they usually can be treated with the use of antibiotics. The choice of antibiotic and length of treatment depend on your symptoms and the type of bacteria causing your infection. HOME CARE  INSTRUCTIONS  If you were prescribed antibiotics, take them exactly as your caregiver instructs you. Finish the medication even if you feel better after you have only taken some of the medication.  Drink enough water and fluids to keep your urine clear or pale yellow.  Avoid caffeine, tea, and carbonated beverages. They tend to irritate your bladder.  Empty your bladder often. Avoid holding urine for long periods of time.  Empty your bladder before and after sexual intercourse.  After a bowel movement, women should cleanse from front to back. Use each tissue only once. SEEK MEDICAL CARE IF:   You have back pain.  You develop a fever.  Your symptoms do not begin to resolve within 3 days. SEEK IMMEDIATE MEDICAL CARE IF:   You have severe back pain or lower abdominal pain.  You develop chills.  You have nausea or vomiting.  You have continued burning or discomfort with urination. MAKE SURE YOU:   Understand these instructions.  Will watch your condition.  Will get help right away if you are not doing well or get worse. Document Released: 10/24/2004 Document Revised: 07/16/2011 Document Reviewed: 02/22/2011 [employer] Patient Information 2015 [employer]. This information is not intended to replace advice given to you by your health care provider. Make sure you discuss any questions you have with your health care provider.  

## 2013-09-11 LAB — URINE CULTURE: Colony Count: 50000

## 2013-09-15 ENCOUNTER — Ambulatory Visit: Payer: Self-pay | Admitting: Pain Medicine

## 2013-09-21 ENCOUNTER — Ambulatory Visit: Payer: Medicare Other | Admitting: Nurse Practitioner

## 2013-09-21 ENCOUNTER — Encounter (HOSPITAL_COMMUNITY): Payer: Self-pay | Admitting: Psychiatry

## 2013-09-21 ENCOUNTER — Ambulatory Visit (INDEPENDENT_AMBULATORY_CARE_PROVIDER_SITE_OTHER): Payer: Self-pay | Admitting: Psychiatry

## 2013-09-21 ENCOUNTER — Telehealth: Payer: Self-pay | Admitting: *Deleted

## 2013-09-21 ENCOUNTER — Other Ambulatory Visit: Payer: Medicare Other

## 2013-09-21 VITALS — BP 136/95 | HR 132 | Ht 66.0 in | Wt 186.0 lb

## 2013-09-21 DIAGNOSIS — F909 Attention-deficit hyperactivity disorder, unspecified type: Secondary | ICD-10-CM

## 2013-09-21 DIAGNOSIS — F319 Bipolar disorder, unspecified: Secondary | ICD-10-CM

## 2013-09-21 DIAGNOSIS — N390 Urinary tract infection, site not specified: Secondary | ICD-10-CM

## 2013-09-21 LAB — URINALYSIS W MICROSCOPIC + REFLEX CULTURE
BILIRUBIN URINE: NEGATIVE
Glucose, UA: NEGATIVE mg/dL
Hgb urine dipstick: NEGATIVE
KETONES UR: NEGATIVE mg/dL
Leukocytes, UA: NEGATIVE
Nitrite: NEGATIVE
PROTEIN: NEGATIVE mg/dL
Specific Gravity, Urine: <1.005 — ABNORMAL LOW (ref 1.005–1.030)
Urobilinogen, UA: 0.2 mg/dL (ref 0.0–1.0)
pH: 5.5 (ref 5.0–8.0)

## 2013-09-21 MED ORDER — BENZTROPINE MESYLATE 1 MG PO TABS: 1.0000 mg | ORAL_TABLET | Freq: Every day | ORAL | Status: DC

## 2013-09-21 MED ORDER — DEXMETHYLPHENIDATE HCL 10 MG PO TABS: 10.0000 mg | ORAL_TABLET | Freq: Every day | ORAL | Status: DC

## 2013-09-21 MED ORDER — ARIPIPRAZOLE 30 MG PO TABS: ORAL_TABLET | ORAL | Status: DC

## 2013-09-21 MED ORDER — DIAZEPAM 5 MG PO TABS: 5.0000 mg | ORAL_TABLET | Freq: Two times a day (BID) | ORAL | Status: DC | PRN

## 2013-09-21 NOTE — Telephone Encounter (Signed)
Pt informed, order placed, pt will be in today at 3:30pm

## 2013-09-21 NOTE — Progress Notes (Signed)
[place] [phone] Progress Note  [name] [id number] 43 y.o.  09/21/2013 4:32 PM  Chief Complaint:  I had surgery for appendicitis.  I'm feeling better.  I had cut down a lot medication.    History of Present Illness:  [name] came for her followup appointment.   On her last visit we started her on Focalin and she is feeling better.  Her attention and focus is improved.  She had recently appendicitis and she had a surgery.  She is recovering from surgery.  She mentioned that she has cut on a lot of medication.  She is not taking nortriptyline and only taking Valium if needed.  She is not taking Neurontin, antibiotics and reduced the dose of the medication.  Her depression is better.  She is able to do multitasking.  She is sleeping better about nortriptyline.  She denies any irritability, anger, mood swings.  She denies any recent crying spells.  She is still taking Abilify, Cogentin, Cymbalta and Valium as needed.  She has no tremors or shakes.  She has no paranoia or any hallucination.  She lives with her mother.  Her husband is very supportive however he usually in and out of town.  Her vitals are stable.  Her appetite is okay.  Suicidal Ideation: No Plan Formed: No Patient has means to carry out plan: No  Homicidal Ideation: No Plan Formed: No Patient has means to carry out plan: No  Past Psychiatric History/Hospitalization(s) Patient endorsed history of mood swings anger and irritability and ADD since childhood.  In the past she had tried Focalin, Vyvanse which worked very well for her until she lost her insurance.  She has seen Eartha Inch for the management of bipolar disorder and ADD.  She did prescribe lithium which makes her careless, doxepin which helped her depression and quetiapine but patient did not see any improvement.  She also tried temazepam, Ambien which worked for only a short time.  She has given Valium with good response .  Patient denies any history of  suicidal attempt but admitted having a strong history of suicidal ideation last year and she was seen in the emergency room however discharge next day.  Patient was never admitted for inpatient services.  Patient denies any history of paranoia , psychosis or any hallucination but admitted history of severe mood swings irritability impulsive behavior , agitation and crying spells.   Patient endorses history of  Impulsive buying, spending money and panic attack .   Anxiety: Yes Bipolar Disorder: Yes Depression: Yes Mania: Yes Psychosis: No Schizophrenia: No Personality Disorder: No Hospitalization for psychiatric illness: No History of Electroconvulsive Shock Therapy: No Prior Suicide Attempts: No  Medical History; Patient has multiple medical problems.  Her primary care physician is Dr. Lovena Le. She has hypertension, asthma, GERD, eczema, cystitis, vitamin D deficiency, chronic pain, chronic fatigue, urinary incontinence , migraine headaches, hyperlipidemia and narcolepsy.  Recently she had surgery for appendicitis  Review of Systems: Psychiatric: Agitation: No Hallucination: No Depressed Mood: Yes Insomnia: No Hypersomnia: No Altered Concentration: No Feels Worthless: No Grandiose Ideas: No Belief In Special Powers: No New/Increased Substance Abuse: No Compulsions: No  Neurologic: Headache: Yes Seizure: No Paresthesias: No  Outpatient Encounter Prescriptions as of 09/21/2013  Medication Sig  . ARIPiprazole (ABILIFY) 30 MG tablet Take one tablet by mouth one time daily  . benztropine (COGENTIN) 1 MG tablet Take 1 tablet (1 mg total) by mouth at bedtime.  [name] dexmethylphenidate (FOCALIN) 10 MG tablet Take  1 tablet (10 mg total) by mouth daily.  . diazepam (VALIUM) 5 MG tablet Take 1 tablet (5 mg total) by mouth 2 (two) times daily as needed for anxiety.  . DULoxetine (CYMBALTA) 30 MG capsule   . Linaclotide (LINZESS) 145 MCG CAPS capsule Take 1-2 capsules (145-290 mcg total)  by mouth daily as needed.  . meloxicam (MOBIC) 15 MG tablet Take 1 tablet (15 mg total) by mouth daily as needed for pain.  . metoprolol tartrate (LOPRESSOR) 25 MG tablet Take 1 tablet (25 mg total) by mouth 2 (two) times daily.  . pantoprazole (PROTONIX) 40 MG tablet Take 1 tablet (40 mg total) by mouth daily.  . phenazopyridine (PYRIDIUM) 95 MG tablet Take 1 tablet (95 mg total) by mouth 3 (three) times daily as needed for pain.  . rizatriptan (MAXALT-MLT) 10 MG disintegrating tablet Take 1 tablet (10 mg total) by mouth as needed for migraine. May repeat in 2 hours if needed, maximum 2 tabs a day  . traMADol (ULTRAM) 50 MG tablet   . [DISCONTINUED] ARIPiprazole (ABILIFY) 30 MG tablet Take one tablet by mouth one time daily  . [DISCONTINUED] benztropine (COGENTIN) 1 MG tablet Take 1 tablet (1 mg total) by mouth at bedtime.  . [DISCONTINUED] dexmethylphenidate (FOCALIN) 10 MG tablet Take 1 tablet (10 mg total) by mouth daily.  . [DISCONTINUED] dexmethylphenidate (FOCALIN) 10 MG tablet Take 1 tablet (10 mg total) by mouth daily.  . [DISCONTINUED] diazepam (VALIUM) 5 MG tablet Take 1 tablet (5 mg total) by mouth 3 (three) times daily as needed for anxiety.  . [DISCONTINUED] fluconazole (DIFLUCAN) 150 MG tablet Take 1 tablet (150 mg total) by mouth once.  . [DISCONTINUED] gabapentin (NEURONTIN) 300 MG capsule Take 1 capsule (300 mg total) by mouth 3 (three) times daily.  . [DISCONTINUED] hydrOXYzine (VISTARIL) 100 MG capsule Take 1 capsule (100 mg total) by mouth at bedtime.  . [DISCONTINUED] nortriptyline (PAMELOR) 75 MG capsule Take 1 capsule (75 mg total) by mouth at bedtime.  . [DISCONTINUED] oxycodone-acetaminophen (ROXICET) 5-500 MG per tablet Take 1 tablet by mouth every 4 (four) hours as needed for pain.  . [DISCONTINUED] sulfamethoxazole-trimethoprim (BACTRIM DS) 800-160 MG per tablet Take 1 tablet by mouth 2 (two) times daily.    Recent Results (from the past 2160 hour(s))  CYTOLOGY - PAP      Status: None   Collection Time    07/23/13 12:00 AM      Result Value Ref Range   CYTOLOGY - PAP PAP RESULT    TSH     Status: None   Collection Time    07/23/13  4:57 PM      Result Value Ref Range   TSH 1.426  0.350 - 3.016 uIU/mL  FOLLICLE STIMULATING HORMONE     Status: None   Collection Time    07/23/13  4:57 PM      Result Value Ref Range   FSH 3.5     Comment: Reference Ranges:              Female:                         1.4 -  18.1 mIU/mL              Female:   Follicular Phase    2.5 -  10.2 mIU/mL  MidCycle Peak       3.4 -  33.4 mIU/mL                        Luteal Phase        1.5 -   9.1 mIU/mL                        Post Menopausal    23.0 - 116.3 mIU/mL                        Pregnant                <   0.3 mIU/mL  CA 125     Status: None   Collection Time    08/05/13 10:49 AM      Result Value Ref Range   CA 125 13.1  0.0 - 30.2 U/mL  URINALYSIS W MICROSCOPIC + REFLEX CULTURE     Status: Abnormal   Collection Time    09/09/13  3:14 PM      Result Value Ref Range   Color, Urine YELLOW  YELLOW   APPearance CLOUDY (*) CLEAR   Specific Gravity, Urine <1.005 (*) 1.005 - 1.030   pH 6.0  5.0 - 8.0   Glucose, UA NEG  NEG mg/dL   Bilirubin Urine NEG  NEG   Ketones, ur NEG  NEG mg/dL   Hgb urine dipstick TRACE (*) NEG   Protein, ur NEG  NEG mg/dL   Urobilinogen, UA 0.2  0.0 - 1.0 mg/dL   Nitrite NEG  NEG   Leukocytes, UA MOD (*) NEG   Squamous Epithelial / LPF FEW  RARE   Crystals NONE SEEN  NONE SEEN   Casts NONE SEEN  NONE SEEN   WBC, UA TNTC (*) <3 WBC/hpf   RBC / HPF 3-6 (*) <3 RBC/hpf   Bacteria, UA MANY (*) RARE  URINE CULTURE     Status: None   Collection Time    09/09/13  3:14 PM      Result Value Ref Range   Colony Count 50,000 COLONIES/ML     Organism ID, Bacteria Multiple bacterial morphotypes present, none     Organism ID, Bacteria predominant. Suggest appropriate recollection if      Organism ID, Bacteria clinically  indicated.    URINALYSIS W MICROSCOPIC + REFLEX CULTURE     Status: Abnormal   Collection Time    09/21/13  3:33 PM      Result Value Ref Range   Color, Urine YELLOW  YELLOW   APPearance CLEAR  CLEAR   Specific Gravity, Urine <1.005 (*) 1.005 - 1.030   pH 5.5  5.0 - 8.0   Glucose, UA NEG  NEG mg/dL   Bilirubin Urine NEG  NEG   Ketones, ur NEG  NEG mg/dL   Hgb urine dipstick NEG  NEG   Protein, ur NEG  NEG mg/dL   Urobilinogen, UA 0.2  0.0 - 1.0 mg/dL   Nitrite NEG  NEG   Leukocytes, UA NEG  NEG    Physical Exam: Constitutional:  BP 136/95  Pulse 132  Ht 5\' 6"  (1.676 m)  Wt 186 lb (84.369 kg)  BMI 30.04 kg/m2  Musculoskeletal: Strength & Muscle Tone: within normal limits Gait & Station: normal Patient leans: N/A  Mental Status Examination;   Patient is casually dressed and fairly groomed.  She appears anxious but  cooperative. Her speech is fast.  Her attention and concentration is fair.  Her thought processes fast but logical.  She described her mood anxious and her affect is mood appropriate.  She denies any auditory or visual hallucination.  She denies any active or passive suicidal thoughts or homicidal thoughts.  Her fund of knowledge is adequate.  There were no tremors or shakes.  There were no delusions, paranoia or any obsession.  There were no flight of ideas or any loose association present at this time.  She is alert and oriented x3.  Her insight judgment and impulse control is okay.     Assessment: Axis I:  Bipolar disorder NOS, attention deficit disorder by history   Axis II:  Deferred   Axis III:  Past Medical History  Diagnosis Date  . Attention deficit disorder without mention of hyperactivity   . Anxiety state, unspecified   . Pain in joint, site unspecified   . Unspecified asthma(493.90)   . Migraine without aura, without mention of intractable migraine without mention of status migrainosus   . Depressive disorder, not elsewhere classified   . Dysuria    . Family history of diabetes mellitus   . Family history of asthma   . Family history of ischemic heart disease     fam h/o premature cardiac disease   . Female stress incontinence   . Esophageal reflux   . Unspecified essential hypertension   . Persistent disorder of initiating or maintaining sleep   . Insomnia, unspecified   . Pain in limb   . Tobacco use disorder   . Unspecified vitamin D deficiency   . Abnormal weight gain   . Dyslipidemia     hypertriglyceridemia  . Hypertension   . Chronic constipation 04/01/2013    Axis IV:  Moderate    Plan:  Patient is able to come out from [employer], Neurontin and Vistaril.  She has cut down her Valium and only taking as needed.  She is showing much improvement with colon.  I will continue Focalin 10 mg daily along with Abilify 30 mg to help her mood lability and anger.  Continue Cogentin however we will reduce Valium to take only as needed.  Discuss in detail the risks and benefits of medication.  Recommended to call us back if she has any question or any concern.  Followup in 2 months.  Time spent 25 minutes.  More than 50% of the time spent in psychoeducation, counseling and coordination of care.  Discuss safety plan that anytime having active suicidal thoughts or homicidal thoughts then patient need to call 911 or go to the local emergency room.   Tashanti Dalporto T., MD 09/21/2013

## 2013-09-21 NOTE — Telephone Encounter (Signed)
FYI pt was treated UTI on 09/09/13 took Septra DS one by mouth twice a day for 7 days would like to come by to leave U/A to confirm infection completely gone.

## 2013-09-21 NOTE — Telephone Encounter (Signed)
That would be fine 

## 2013-09-23 ENCOUNTER — Ambulatory Visit: Payer: Self-pay | Admitting: Pain Medicine

## 2013-09-24 ENCOUNTER — Telehealth: Payer: Self-pay | Admitting: *Deleted

## 2013-09-24 ENCOUNTER — Other Ambulatory Visit: Payer: Self-pay | Admitting: Women's Health

## 2013-09-24 MED ORDER — PHENAZOPYRIDINE HCL 100 MG PO TABS: 100.0000 mg | ORAL_TABLET | Freq: Three times a day (TID) | ORAL | Status: DC | PRN

## 2013-09-24 NOTE — Telephone Encounter (Signed)
Please call, I  E. scribed pyridium 100 which is generic, take 3 times daily, if no relief have her call back. AZO over-the-counter.  Uribel is similar type products but only brand name.

## 2013-09-24 NOTE — Telephone Encounter (Signed)
[name] pt said pyridium was not covered asked if an another medication could be sent to pharmacy?

## 2013-09-24 NOTE — Telephone Encounter (Signed)
(  Dr.Fernandez patient) Pt dropped u/a sample on 09/21/13 still complaining of frequent urination. Has been to bathroom 7 times last night. Please advise

## 2013-09-24 NOTE — Telephone Encounter (Signed)
(  pt aware you are out of the office) Pt has ultrasound scheduled on 11/04/13 seen on 09/09/13 for lower abdomen pain. Pt did take the Rx Septra DS one by mouth twice a day for 7 days and had repeat u/a done on 09/21/13 which was negative.  Pt is still feeling uncomfortable asked if ultrasound could be scheduled sooner?

## 2013-09-24 NOTE — Telephone Encounter (Signed)
Please inform urine from 09/21/2013 negative. Ask if pyridium helped,  if so call in pyridium 200 mg 3 times daily #21. Review importance of drinking more fluids during the day, stop after 7 PM. May also have overactive bladder, have her schedule appointment with Dr. Toney Rakes if symptoms persist.

## 2013-09-24 NOTE — Telephone Encounter (Signed)
Pt informed with the below note, pt said pyridium was not covered by insurance. Pt will follow up if needed.

## 2013-09-24 NOTE — Telephone Encounter (Signed)
Pt informed with the below note. 

## 2013-09-27 NOTE — Telephone Encounter (Signed)
Lets move her ultrasound for second week in september

## 2013-09-27 NOTE — Telephone Encounter (Signed)
Pt informed with the below note, front desk will be informed to schedule sooner.

## 2013-10-01 ENCOUNTER — Ambulatory Visit (INDEPENDENT_AMBULATORY_CARE_PROVIDER_SITE_OTHER): Payer: Medicare Other | Admitting: Internal Medicine

## 2013-10-01 ENCOUNTER — Other Ambulatory Visit (INDEPENDENT_AMBULATORY_CARE_PROVIDER_SITE_OTHER): Payer: Medicare Other

## 2013-10-01 ENCOUNTER — Encounter: Payer: Self-pay | Admitting: Internal Medicine

## 2013-10-01 VITALS — BP 118/90 | HR 96 | Temp 98.8°F | Ht 65.0 in | Wt 184.0 lb

## 2013-10-01 DIAGNOSIS — F411 Generalized anxiety disorder: Secondary | ICD-10-CM

## 2013-10-01 DIAGNOSIS — R109 Unspecified abdominal pain: Secondary | ICD-10-CM

## 2013-10-01 DIAGNOSIS — IMO0002 Reserved for concepts with insufficient information to code with codable children: Secondary | ICD-10-CM

## 2013-10-01 DIAGNOSIS — R739 Hyperglycemia, unspecified: Secondary | ICD-10-CM

## 2013-10-01 DIAGNOSIS — Z0000 Encounter for general adult medical examination without abnormal findings: Secondary | ICD-10-CM

## 2013-10-01 DIAGNOSIS — F32A Depression, unspecified: Secondary | ICD-10-CM

## 2013-10-01 DIAGNOSIS — J45909 Unspecified asthma, uncomplicated: Secondary | ICD-10-CM

## 2013-10-01 DIAGNOSIS — M545 Low back pain, unspecified: Secondary | ICD-10-CM

## 2013-10-01 DIAGNOSIS — I10 Essential (primary) hypertension: Secondary | ICD-10-CM

## 2013-10-01 DIAGNOSIS — F3289 Other specified depressive episodes: Secondary | ICD-10-CM

## 2013-10-01 DIAGNOSIS — R7309 Other abnormal glucose: Secondary | ICD-10-CM

## 2013-10-01 DIAGNOSIS — F329 Major depressive disorder, single episode, unspecified: Secondary | ICD-10-CM

## 2013-10-01 LAB — URINALYSIS
BILIRUBIN URINE: NEGATIVE
HGB URINE DIPSTICK: NEGATIVE
Ketones, ur: NEGATIVE
Leukocytes, UA: NEGATIVE
NITRITE: NEGATIVE
Specific Gravity, Urine: 1.015 (ref 1.000–1.030)
TOTAL PROTEIN, URINE-UPE24: NEGATIVE
Urine Glucose: NEGATIVE
Urobilinogen, UA: 0.2 (ref 0.0–1.0)
pH: 6 (ref 5.0–8.0)

## 2013-10-01 LAB — CBC WITH DIFFERENTIAL/PLATELET
BASOS PCT: 1 % (ref 0.0–3.0)
Basophils Absolute: 0.1 10*3/uL (ref 0.0–0.1)
EOS PCT: 1.7 % (ref 0.0–5.0)
Eosinophils Absolute: 0.1 10*3/uL (ref 0.0–0.7)
HCT: 48.9 % — ABNORMAL HIGH (ref 36.0–46.0)
Hemoglobin: 16.8 g/dL — ABNORMAL HIGH (ref 12.0–15.0)
Lymphocytes Relative: 29.1 % (ref 12.0–46.0)
Lymphs Abs: 2 10*3/uL (ref 0.7–4.0)
MCHC: 34.3 g/dL (ref 30.0–36.0)
MCV: 90.7 fl (ref 78.0–100.0)
MONO ABS: 0.7 10*3/uL (ref 0.1–1.0)
Monocytes Relative: 10.6 % (ref 3.0–12.0)
Neutro Abs: 4 10*3/uL (ref 1.4–7.7)
Neutrophils Relative %: 57.6 % (ref 43.0–77.0)
PLATELETS: 286 10*3/uL (ref 150.0–400.0)
RBC: 5.4 Mil/uL — AB (ref 3.87–5.11)
RDW: 13.2 % (ref 11.5–15.5)
WBC: 6.9 10*3/uL (ref 4.0–10.5)

## 2013-10-01 LAB — LIPID PANEL
Cholesterol: 225 mg/dL — ABNORMAL HIGH (ref 0–200)
HDL: 41.1 mg/dL (ref >39.00)
LDL Cholesterol: 164 mg/dL — ABNORMAL HIGH (ref 0–99)
NonHDL: 183.9
TRIGLYCERIDES: 98 mg/dL (ref 0.0–149.0)
Total CHOL/HDL Ratio: 5
VLDL: 19.6 mg/dL (ref 0.0–40.0)

## 2013-10-01 LAB — HEPATIC FUNCTION PANEL
ALBUMIN: 4.7 g/dL (ref 3.5–5.2)
ALT: 23 U/L (ref 0–35)
AST: 15 U/L (ref 0–37)
Alkaline Phosphatase: 106 U/L (ref 39–117)
Bilirubin, Direct: 0 mg/dL (ref 0.0–0.3)
TOTAL PROTEIN: 8.8 g/dL — AB (ref 6.0–8.3)
Total Bilirubin: 0.4 mg/dL (ref 0.2–1.2)

## 2013-10-01 LAB — BASIC METABOLIC PANEL
BUN: 10 mg/dL (ref 6–23)
CALCIUM: 10 mg/dL (ref 8.4–10.5)
CO2: 27 mEq/L (ref 19–32)
Chloride: 104 mEq/L (ref 96–112)
Creatinine, Ser: 0.6 mg/dL (ref 0.4–1.2)
GFR: 119.48 mL/min (ref >60.00)
GLUCOSE: 95 mg/dL (ref 70–99)
Potassium: 4.3 mEq/L (ref 3.5–5.1)
Sodium: 139 mEq/L (ref 135–145)

## 2013-10-01 LAB — SEDIMENTATION RATE: Sed Rate: 16 mm/hr (ref 0–22)

## 2013-10-01 LAB — HEMOGLOBIN A1C: Hgb A1c MFr Bld: 5.8 % (ref 4.6–6.5)

## 2013-10-01 LAB — TSH: TSH: 1.83 u[IU]/mL (ref 0.35–4.50)

## 2013-10-01 NOTE — Progress Notes (Signed)
Pre visit review using our clinic review tool, if applicable. No additional management support is needed unless otherwise documented below in the visit note. 

## 2013-10-01 NOTE — Progress Notes (Signed)
Subjective:   The patient is here for a wellness exam.  F/u recent appendectomy 05/27/13. C/o severe pain w/each intercourse.Marland KitchenMarland KitchenSeeing Dr Toney Rakes. C/o lower abd pains post-appendectomy... Lost 100 lbs in 2 years on diet  F/u on bipolar disorder: cont to see Dr Adele Schilder F/u HAs - better Trying to loose wt... [facility] Reece Levy Presents for follow-up visit. The problem has been gradually improving. Symptoms include insomnia and nervous/anxious behavior. Patient reports no confusion, decreased concentration, depressed mood, dizziness, nausea, shortness of breath or suicidal ideas. The severity of symptoms is moderate. The quality of sleep is poor.   Her past medical history is significant for bipolar disorder.    The patient is here to follow up on chronic bipolar depression, anxiety, headaches and chronic moderate fibromyalgia and LBP symptoms partially controlled with medicines, diet and exercise. F/u HTN, asthma.  She is on [employer] disability for her bipolar  Wt Readings from Last 3 Encounters:  10/01/13 184 lb (83.462 kg)  09/21/13 186 lb (84.369 kg)  07/23/13 192 lb (87.091 kg)   BP Readings from Last 3 Encounters:  10/01/13 118/90  09/21/13 136/95  09/09/13 132/80       Review of Systems  Constitutional: Positive for fatigue. Negative for chills, activity change, appetite change and unexpected weight change.  HENT: Negative for congestion, mouth sores and sinus pressure.   Eyes: Negative for visual disturbance.  Respiratory: Positive for cough. Negative for chest tightness and shortness of breath.   Gastrointestinal: Negative for nausea.  Genitourinary: Negative for frequency, difficulty urinating and vaginal pain.  Musculoskeletal: Negative for gait problem.  Skin: Negative for pallor and rash.  Neurological: Negative for dizziness and tremors.  Psychiatric/Behavioral: Positive for behavioral problems, sleep disturbance and  dysphoric mood. Negative for suicidal ideas, confusion and decreased concentration. The patient is nervous/anxious and has insomnia.        Objective:   Physical Exam  Constitutional: She appears well-developed. No distress.  obese  HENT:  Head: Normocephalic.  Right Ear: External ear normal.  Left Ear: External ear normal.  Nose: Nose normal.  Mouth/Throat: Oropharynx is clear and moist.  Eyes: Conjunctivae are normal. Pupils are equal, round, and reactive to light. Right eye exhibits no discharge. Left eye exhibits no discharge.  Neck: Normal range of motion. Neck supple. No JVD present. No tracheal deviation present. No thyromegaly present.  Cardiovascular: Normal rate, regular rhythm and normal heart sounds.   Pulmonary/Chest: No stridor. No respiratory distress. She has no wheezes.  Abdominal: Soft. Bowel sounds are normal. She exhibits no distension and no mass. There is no tenderness. There is no rebound and no guarding.  Musculoskeletal: She exhibits no edema and no tenderness.  Lymphadenopathy:    She has no cervical adenopathy.  Neurological: She displays tremor. She displays normal reflexes. No cranial nerve deficit. She exhibits normal muscle tone. Coordination normal.  Skin: No rash noted. No erythema.  Psychiatric: Her behavior is normal. Judgment and thought content normal. Her mood appears not anxious. Thought content is not paranoid. She does not exhibit a depressed mood. She expresses no homicidal and no suicidal ideation.  Looks much better Restless legs   Lab Results  Component Value Date   WBC 9.2 04/02/2012   HGB 15.9* 04/02/2012   HCT 46.2* 04/02/2012   PLT 221.0 04/02/2012   GLUCOSE 102* 12/14/2012   CHOL 217* 02/18/2013   TRIG 202.0* 02/18/2013   HDL 37.60* 02/18/2013  LDLDIRECT 161.1 02/18/2013   LDLCALC 102* 07/29/2011   ALT 34 12/14/2012   AST 24 12/14/2012   NA 137 12/14/2012   K 3.9 12/14/2012   CL 102 12/14/2012   CREATININE 0.6 12/14/2012   BUN 14  12/14/2012   CO2 26 12/14/2012   TSH 1.426 07/23/2013   HGBA1C 5.6 02/18/2013           Assessment & Plan:

## 2013-10-01 NOTE — Assessment & Plan Note (Signed)
Continue with current prescription therapy as reflected on the Med list.  

## 2013-10-01 NOTE — Assessment & Plan Note (Addendum)
5/15 post appendectomy 05/27/13 Likely  Due to adhesions F/u w/Dr Toney Rakes GI ref to r/o IBD  Not better

## 2013-10-01 NOTE — Assessment & Plan Note (Signed)
We discussed age appropriate health related issues, including available/recomended screening tests and vaccinations. We discussed a need for adhering to healthy diet and exercise. Labs/EKG were reviewed/ordered. All questions were answered.  Declined a flu shot

## 2013-10-01 NOTE — Patient Instructions (Signed)
Gluten free trial (no wheat products) for 4-6 weeks. OK to use gluten-free bread and gluten-free pasta.  Milk free trial (no milk, ice cream, cheese and yogurt) for 4-6 weeks. OK to use almond, coconut, rice or soy milk. "Almond breeze" brand tastes good.  

## 2013-10-06 ENCOUNTER — Other Ambulatory Visit: Payer: Self-pay | Admitting: Geriatric Medicine

## 2013-10-06 MED ORDER — DULOXETINE HCL 30 MG PO CPEP: 30.0000 mg | ORAL_CAPSULE | Freq: Two times a day (BID) | ORAL | Status: DC

## 2013-10-07 ENCOUNTER — Other Ambulatory Visit: Payer: Medicare Other

## 2013-10-07 ENCOUNTER — Encounter: Payer: Self-pay | Admitting: Internal Medicine

## 2013-10-07 ENCOUNTER — Ambulatory Visit: Payer: Medicare Other | Admitting: Gynecology

## 2013-10-07 ENCOUNTER — Ambulatory Visit (INDEPENDENT_AMBULATORY_CARE_PROVIDER_SITE_OTHER): Payer: Medicare Other

## 2013-10-07 ENCOUNTER — Ambulatory Visit (INDEPENDENT_AMBULATORY_CARE_PROVIDER_SITE_OTHER): Payer: Medicare Other | Admitting: Gynecology

## 2013-10-07 ENCOUNTER — Encounter: Payer: Self-pay | Admitting: Gynecology

## 2013-10-07 DIAGNOSIS — R102 Pelvic and perineal pain: Principal | ICD-10-CM

## 2013-10-07 DIAGNOSIS — N83209 Unspecified ovarian cyst, unspecified side: Secondary | ICD-10-CM

## 2013-10-07 DIAGNOSIS — R350 Frequency of micturition: Secondary | ICD-10-CM

## 2013-10-07 DIAGNOSIS — N949 Unspecified condition associated with female genital organs and menstrual cycle: Secondary | ICD-10-CM

## 2013-10-07 DIAGNOSIS — Z23 Encounter for immunization: Secondary | ICD-10-CM

## 2013-10-07 DIAGNOSIS — G8929 Other chronic pain: Secondary | ICD-10-CM

## 2013-10-07 DIAGNOSIS — Z8742 Personal history of other diseases of the female genital tract: Secondary | ICD-10-CM

## 2013-10-07 DIAGNOSIS — N83201 Unspecified ovarian cyst, right side: Secondary | ICD-10-CM

## 2013-10-07 NOTE — Progress Notes (Signed)
   The patient presented to the office today for followup on previously seen right ovarian cyst and midline pelvic pain. In August of this year patient was treated for a suspected urinary tract infection based on her symptoms but urine culture was nonspecific. It appears her symptoms improve after she voids making it highly suspicious for possible underlying interstitial cystitis. The patient also had gone to her internist who placed her on Linzess for chronic constipation.   Patient was seen last on 08/04/2013 as a result of her referral from her PCP because the patient's suprapubic pain and dyspareunia. Her ultrasound that demonstrated the following: Absent uterus. Right ovary rim of tissue seen with thin wall a vascular cyst measuring 42 x 50 x 44 mm average size 4.5 mm. Echo-free. Left ovary and echo-free cyst measuring 19 x 17 mm with internal low level echoes. Negative fluid in the cul-de-sac.  Patient in April of this year had a ruptured appendix. Patient stated that also In [facility] she had had laparoscopic supracervical hysterectomy secondary to pelvic pain which resolved until after her appendectomy this year. The patient stated she had had a ruptured appendix. Patient also was complaining having some vasomotor symptoms. And also past history of left ovarian cyst.  She received a shot of Depo-Provera 150 mg IM and was instructed to return back in 3 months for followup ultrasound and she returned back today. Ultrasound report today as follows:  Absent uterus. Right ovarian follicle ankle freeing measuring 19 x 16 mm. Previous ovarian cysts no longer seen. Left ovary was normal. No fluid in the cul-de-sac. No apparent masses were seen on either right or left adnexa.  Patient states her symptoms have not improved.  Assessment/plan: #1 patient has a scheduled appointment to see the gastroenterologists as per referral by her internist. If her symptomatology are not GI related I  would recommend that she followup with the urologist to rule out interstitial cystitis. She feels relief after voiding at times as well. If after GU and GI evaluation has been completed and no etiology for her symptoms are determined that as well as resource offer diagnostic laparoscopy. Patient did not appear to be in any acute distress today. She did receive the flu vaccine today. Literature information on interstitial cystitis as well as on endometriosis and laparoscopy was provided as well.

## 2013-10-07 NOTE — Patient Instructions (Signed)
Influenza Virus Vaccine injection (Fluarix) What is this medicine? INFLUENZA VIRUS VACCINE (in floo EN zuh VAHY ruhs vak SEEN) helps to reduce the risk of getting influenza also known as the flu. This medicine may be used for other purposes; ask your health care provider or pharmacist if you have questions. COMMON BRAND NAME(S): Fluarix, Fluzone What should I tell my health care provider before I take this medicine? They need to know if you have any of these conditions: -bleeding disorder like hemophilia -fever or infection -Guillain-Barre syndrome or other neurological problems -immune system problems -infection with the human immunodeficiency virus (HIV) or AIDS -low blood platelet counts -multiple sclerosis -an unusual or allergic reaction to influenza virus vaccine, eggs, chicken proteins, latex, gentamicin, other medicines, foods, dyes or preservatives -pregnant or trying to get pregnant -breast-feeding How should I use this medicine? This vaccine is for injection into a muscle. It is given by a health care professional. A copy of Vaccine Information Statements will be given before each vaccination. Read this sheet carefully each time. The sheet may change frequently. Talk to your pediatrician regarding the use of this medicine in children. Special care may be needed. Overdosage: If you think you have taken too much of this medicine contact a poison control center or emergency room at once. NOTE: This medicine is only for you. Do not share this medicine with others. What if I miss a dose? This does not apply. What may interact with this medicine? -chemotherapy or radiation therapy -medicines that lower your immune system like etanercept, anakinra, infliximab, and adalimumab -medicines that treat or prevent blood clots like warfarin -phenytoin -steroid medicines like prednisone or cortisone -theophylline -vaccines This list may not describe all possible interactions. Give your  health care provider a list of all the medicines, herbs, non-prescription drugs, or dietary supplements you use. Also tell them if you smoke, drink alcohol, or use illegal drugs. Some items may interact with your medicine. What should I watch for while using this medicine? Report any side effects that do not go away within 3 days to your doctor or health care professional. Call your health care provider if any unusual symptoms occur within 6 weeks of receiving this vaccine. You may still catch the flu, but the illness is not usually as bad. You cannot get the flu from the vaccine. The vaccine will not protect against colds or other illnesses that may cause fever. The vaccine is needed every year. What side effects may I notice from receiving this medicine? Side effects that you should report to your doctor or health care professional as soon as possible: -allergic reactions like skin rash, itching or hives, swelling of the face, lips, or tongue Side effects that usually do not require medical attention (report to your doctor or health care professional if they continue or are bothersome): -fever -headache -muscle aches and pains -pain, tenderness, redness, or swelling at site where injected -weak or tired This list may not describe all possible side effects. Call your doctor for medical advice about side effects. You may report side effects to FDA at 1-800-FDA-1088. Where should I keep my medicine? This vaccine is only given in a clinic, pharmacy, doctor's office, or other health care setting and will not be stored at home. NOTE: This sheet is a summary. It may not cover all possible information. If you have questions about this medicine, talk to your doctor, pharmacist, or health care provider.  2015, [employer]. (2007-08-12 09:30:40) Diagnostic Laparoscopy Laparoscopy is a  surgical procedure. It is used to diagnose and treat diseases inside the belly (abdomen). It is usually a  brief, common, and relatively simple procedure. The laparoscopeis a thin, lighted, pencil-sized instrument. It is like a telescope. It is inserted into your abdomen through a small cut (incision). Your caregiver can look at the organs inside your body through this instrument. He or she can see if there is anything abnormal. Laparoscopy can be done either in a hospital or outpatient clinic. You may be given a mild sedative to help you relax before the procedure. Once in the operating room, you will be given a drug to make you sleep (general anesthesia). Laparoscopy usually lasts less than 1 hour. After the procedure, you will be monitored in a recovery area until you are stable and doing well. Once you are home, it will take 2 to 3 days to fully recover. RISKS AND COMPLICATIONS  Laparoscopy has relatively few risks. Your caregiver will discuss the risks with you before the procedure. Some problems that can occur include:  Infection.  Bleeding.  Damage to other organs.  Anesthetic side effects. PROCEDURE Once you receive anesthesia, your surgeon inflates the abdomen with a harmless gas (carbon dioxide). This makes the organs easier to see. The laparoscope is inserted into the abdomen through a small incision. This allows your surgeon to see into the abdomen. Other small instruments are also inserted into the abdomen through other small openings. Many surgeons attach a video camera to the laparoscope to enlarge the view. During a diagnostic laparoscopy, the surgeon may be looking for inflammation, infection, or cancer. Your surgeon may take tissue samples(biopsies). The samples are sent to a specialist in looking at cells and tissue samples (pathologist). The pathologist examines them under a microscope. Biopsies can help to diagnose or confirm a disease. AFTER THE PROCEDURE   The gas is released from inside the abdomen.  The incisions are closed with stitches (sutures). Because these incisions are  small (usually less than 1/2 inch), there is usually minimal discomfort after the procedure. There may be some mild discomfort in the throat. This is from the tube placed in the throat while you were sleeping. You may have some mild abdominal discomfort. There may also be discomfort from the instrument placement incisions in the abdomen.  The recovery time is shortened as long as there are no complications.  You will rest in a recovery room until stable and doing well. As long as there are no complications, you may be allowed to go home. FINDING OUT THE RESULTS OF YOUR TEST Not all test results are available during your visit. If your test results are not back during the visit, make an appointment with your caregiver to find out the results. Do not assume everything is normal if you have not heard from your caregiver or the medical facility. It is important for you to follow up on all of your test results. HOME CARE INSTRUCTIONS   Take all medicines as directed.  Only take over-the-counter or prescription medicines for pain, discomfort, or fever as directed by your caregiver.  Resume daily activities as directed.  Showers are preferred over baths.  You may resume sexual activities in 1 week or as directed.  Do not drive while taking narcotics. SEEK MEDICAL CARE IF:   There is increasing abdominal pain.  There is new pain in the shoulders (shoulder strap areas).  You feel lightheaded or faint.  You have the chills.  You or your child has an  oral temperature above 102 F (38.9 C).  There is pus-like (purulent) drainage from any of the wounds.  You are unable to pass gas or have a bowel movement.  You feel sick to your stomach (nauseous) or throw up (vomit). MAKE SURE YOU:   Understand these instructions.  Will watch your condition.  Will get help right away if you are not doing well or get worse. Document Released: 04/22/2000 Document Revised: 05/11/2012 Document Reviewed:  01/14/2007 [facility] Patient Information 2015 [place], [place]. This information is not intended to replace advice given to you by your health care provider. Make sure you discuss any questions you have with your health care provider. Endometriosis Endometriosis is a condition in which the tissue that lines the uterus (endometrium) grows outside of its normal location. The tissue may grow in many locations close to the uterus, but it commonly grows on the ovaries, fallopian tubes, vagina, or bowel. Because the uterus expels, or sheds, its lining every menstrual cycle, there is bleeding wherever the endometrial tissue is located. This can cause pain because blood is irritating to tissues not normally exposed to it.  CAUSES  The cause of endometriosis is not known.  SIGNS AND SYMPTOMS  Often, there are no symptoms. When symptoms are present, they can vary with the location of the displaced tissue. Various symptoms can occur at different times. Although symptoms occur mainly during a woman's menstrual period, they can also occur midcycle and usually stop with menopause. Some people may go months with no symptoms at all. Symptoms may include:   Back or abdominal pain.   Heavier bleeding during periods.   Pain during intercourse.   Painful bowel movements.   Infertility. DIAGNOSIS  Your health care provider will do a physical exam and ask about your symptoms. Various tests may be done, such as:   Blood tests and urine tests. These are done to help rule out other problems.   Ultrasound. This test is done to look for abnormal tissue.   An X-ray of the lower bowel (barium enema).  Laparoscopy. In this procedure, a thin, lighted tube with a tiny camera on the end (laparoscope) is inserted into your abdomen. This helps your health care provider look for abnormal tissue to confirm the diagnosis. The health care provider may also remove a small piece of tissue (biopsy) from any abnormal tissue  found. This tissue sample can then be sent to a lab so it can be looked at under a microscope. TREATMENT  Treatment will vary and may include:   Medicines to relieve pain. Nonsteroidal anti-inflammatory drugs (NSAIDs) are a type of pain medicine that can help to relieve the pain caused by endometriosis.  Hormonal therapy. When using hormonal therapy, periods are eliminated. This eliminates the monthly exposure to blood by the displaced endometrial tissue.   Surgery. Surgery may sometimes be done to remove the abnormal endometrial tissue. In severe cases, surgery may be done to remove the fallopian tubes, uterus, and ovaries (hysterectomy). HOME CARE INSTRUCTIONS   Take all medicines as directed by your health care provider. Do not take aspirin because it may increase bleeding when you are not on hormonal therapy.   Avoid activities that produce pain, including sexual activity. SEEK MEDICAL CARE IF:  You have pelvic pain before, after, or during your periods.  You have pelvic pain between periods that gets worse during your period.  You have pelvic pain during or after sex.  You have pelvic pain with bowel movements or urination,  especially during your period.  You have problems getting pregnant.  You have a fever. SEEK IMMEDIATE MEDICAL CARE IF:   Your pain is severe and is not responding to pain medicine.   You have severe nausea and vomiting, or you cannot keep foods down.   You have pain that is limited to the right lower part of your abdomen.   You have swelling or increasing pain in your abdomen.   You see blood in your stool.  MAKE SURE YOU:   Understand these instructions.  Will watch your condition.  Will get help right away if you are not doing well or get worse. Document Released: 01/12/2000 Document Revised: 05/31/2013 Document Reviewed: 09/11/2012 [facility] Patient Information 2015 [place], [place]. This information is not intended to replace advice  given to you by your health care provider. Make sure you discuss any questions you have with your health care provider. Interstitial Cystitis Interstitial cystitis (IC) is a condition that results in discomfort or pain in the bladder and the surrounding pelvic region. The symptoms can be different from case to case and even in the same individual. People may experience:  Mild discomfort.  Pressure.  Tenderness.  Intense pain in the bladder and pelvic area. CAUSES  Because IC varies so much in symptoms and severity, people studying this disease believe it is not one but several diseases. Some caregivers use the term painful bladder syndrome (PBS) to describe cases with painful urinary symptoms. This may not meet the strictest definition of IC. The term IC / PBS includes all cases of urinary pain that cannot be connected to other causes, such as infection or urinary stones.  SYMPTOMS  Symptoms may include:  An urgent need to urinate.  A frequent need to urinate.  A combination of these symptoms. Pain may change in intensity as the bladder fills with urine or as it empties. Women's symptoms often get worse during menstruation. They may sometimes experience pain with vaginal intercourse. Some of the symptoms of IC / PBS seem like those of bacterial infection. Tests do not show infection. IC / PBS is far more common in women than in men.  DIAGNOSIS  The diagnosis of IC / PBS is based on:  Presence of pain related to the bladder, usually along with problems of frequency and urgency.  Not finding other diseases that could cause the symptoms.  Diagnostic tests that help rule out other diseases include:  Urinalysis.  Urine culture.  Cystoscopy.  Biopsy of the bladder wall.  Distension of the bladder under anesthesia.  Urine cytology.  Laboratory examination of prostate secretions. A biopsy is a tissue sample that can be looked at under a microscope. Samples of the bladder and  urethra may be removed during a cystoscopy. A biopsy helps rule out bladder cancer. TREATMENT  Scientists have not yet found a cure for IC / PBS. Patients with IC / PBS do not get better with antibiotic therapy. Caregivers cannot predict who will respond best to which treatment. Symptoms may disappear without explanation. Disappearing symptoms may coincide with an event such as a change in diet or treatment. Even when symptoms disappear, they may return after days, weeks, months, or years.  Because the causes of IC / PBS are unknown, current treatments are aimed at relieving symptoms. Many people are helped by one or a combination of the treatments. As researchers learn more about IC / PBS, the list of potential treatments will change. Patients should discuss their options with a caregiver.  SURGERY  Surgery should be considered only if all available treatments have failed and the pain is disabling. Many approaches and techniques are used. Each approach has its own advantages and complications. Advantages and complications should be discussed with a urologist. Your caregiver may recommend consulting another urologist for a second opinion. Most caregivers are reluctant to operate because the outcome is unpredictable. Some people still have symptoms after surgery.  People considering surgery should discuss the potential risks and benefits, side effects, and long- and short-term complications with their family, as well as with people who have already had the procedure. Surgery requires anesthesia, hospitalization, and in some cases weeks or months of recovery. As the complexity of the procedure increases, so do the chances for complications and for failure. HOME CARE INSTRUCTIONS   All drugs, even those sold over the counter, have side effects. Patients should always consult a caregiver before using any drug for an extended amount of time. Only take over-the-counter or prescription medicines for pain,  discomfort, or fever as directed by your caregiver.  Many patients feel that smoking makes their symptoms worse. How the by-products of tobacco that are excreted in the urine affect IC / PBS is unknown. Smoking is the major known cause of bladder cancer. One of the best things smokers can do for their bladder and their overall health is to quit.  Many patients feel that gentle stretching exercises help relieve IC / PBS symptoms.  Methods vary, but basically patients decide to empty their bladder at designated times and use relaxation techniques and distractions to keep to the schedule. Gradually, patients try to lengthen the time between scheduled voids. A diary in which to record voiding times is usually helpful in keeping track of progress. MAKE SURE YOU:   Understand these instructions.  Will watch your condition.  Will get help right away if you are not doing well or get worse. Document Released: 09/15/2003 Document Revised: 04/08/2011 Document Reviewed: 11/30/2007 [facility] Patient Information 2015 [place], [place]. This information is not intended to replace advice given to you by your health care provider. Make sure you discuss any questions you have with your health care provider.

## 2013-10-12 ENCOUNTER — Encounter: Payer: Self-pay | Admitting: Gastroenterology

## 2013-10-12 ENCOUNTER — Telehealth: Payer: Self-pay | Admitting: Internal Medicine

## 2013-10-12 NOTE — Telephone Encounter (Signed)
Patient is requesting a referral to a GI other than [facility] to get in sooner.

## 2013-10-14 NOTE — Telephone Encounter (Signed)
Called [facility] to cancel existing appt in November. Referral faxed to [facility] and pt is aware. See referral for further documentation.

## 2013-10-26 ENCOUNTER — Ambulatory Visit: Payer: Self-pay | Admitting: Pain Medicine

## 2013-11-04 ENCOUNTER — Ambulatory Visit: Payer: Medicare Other | Admitting: Gynecology

## 2013-11-04 ENCOUNTER — Other Ambulatory Visit: Payer: Medicare Other

## 2013-11-05 ENCOUNTER — Other Ambulatory Visit: Payer: Medicare Other

## 2013-11-05 ENCOUNTER — Ambulatory Visit: Payer: Medicare Other | Admitting: Gynecology

## 2013-11-08 ENCOUNTER — Ambulatory Visit: Payer: Self-pay | Admitting: Pain Medicine

## 2013-11-09 ENCOUNTER — Other Ambulatory Visit (HOSPITAL_COMMUNITY): Payer: Self-pay | Admitting: *Deleted

## 2013-11-09 DIAGNOSIS — F319 Bipolar disorder, unspecified: Secondary | ICD-10-CM

## 2013-11-09 MED ORDER — DIAZEPAM 5 MG PO TABS: 5.0000 mg | ORAL_TABLET | Freq: Two times a day (BID) | ORAL | Status: DC | PRN

## 2013-11-09 NOTE — Telephone Encounter (Signed)
Pt pharmacy requesting pt medication (diazepam) to be refilled. Per Dr. Adele Schilder to give pt 45 tablets. Called pt and lmtcb to inform her about her Valium being ready for her to pick up.

## 2013-11-09 NOTE — Telephone Encounter (Signed)
Per Dr. Adele Schilder to fill pt medication for 45 tablets

## 2013-11-10 ENCOUNTER — Telehealth (HOSPITAL_COMMUNITY): Payer: Self-pay

## 2013-11-10 NOTE — Telephone Encounter (Signed)
Jashayla picked up her prescription on 11/10/13  DL [phone] [place]  dlo

## 2013-11-13 ENCOUNTER — Encounter: Payer: Self-pay | Admitting: Internal Medicine

## 2013-11-22 ENCOUNTER — Ambulatory Visit (INDEPENDENT_AMBULATORY_CARE_PROVIDER_SITE_OTHER): Payer: Self-pay | Admitting: Psychiatry

## 2013-11-22 ENCOUNTER — Encounter (HOSPITAL_COMMUNITY): Payer: Self-pay | Admitting: Psychiatry

## 2013-11-22 VITALS — BP 143/98 | HR 105 | Ht 65.0 in | Wt 189.6 lb

## 2013-11-22 DIAGNOSIS — F909 Attention-deficit hyperactivity disorder, unspecified type: Secondary | ICD-10-CM

## 2013-11-22 DIAGNOSIS — F988 Other specified behavioral and emotional disorders with onset usually occurring in childhood and adolescence: Secondary | ICD-10-CM

## 2013-11-22 DIAGNOSIS — F319 Bipolar disorder, unspecified: Secondary | ICD-10-CM

## 2013-11-22 MED ORDER — DEXMETHYLPHENIDATE HCL ER 10 MG PO CP24: 10.0000 mg | ORAL_CAPSULE | Freq: Every day | ORAL | Status: DC

## 2013-11-22 MED ORDER — DIAZEPAM 5 MG PO TABS: 5.0000 mg | ORAL_TABLET | Freq: Two times a day (BID) | ORAL | Status: DC | PRN

## 2013-11-22 MED ORDER — BENZTROPINE MESYLATE 1 MG PO TABS: 1.0000 mg | ORAL_TABLET | Freq: Every day | ORAL | Status: DC

## 2013-11-22 MED ORDER — ARIPIPRAZOLE 30 MG PO TABS: ORAL_TABLET | ORAL | Status: DC

## 2013-11-22 NOTE — Progress Notes (Signed)
[facility] [id number] Progress Note  [name] [id number] 43 y.o.  11/22/2013 9:14 AM  Chief Complaint:  I want to try Focalin extended release.  My attention and focus is not good.      History of Present Illness:  [name] came for her followup appointment.  She is taking Focalin 10 mg every day however sometimes she felt that her attention and focus is not as good.  She has difficulty doing multitasking.  She continues to take Abilify Cogentin and Valium at least one to 2 times every day.  However she sleeping better.  She recently saw her primary care physician but she do not recall any major changes in her medication.  She endorse increased stress from the family.  She is taking care of her mother who has a lot of health issues.  She denies any paranoia or any hallucination.  Her appetite is okay.  She is not drinking or using any illegal substances.  Her husband is very supportive however he usually travels and mostly out of the town.  Patient lives with her mother.  Her appetite is okay her vitals are stable.  She is getting Cymbalta from her primary care physician.  Suicidal Ideation: No Plan Formed: No Patient has means to carry out plan: No  Homicidal Ideation: No Plan Formed: No Patient has means to carry out plan: No  Past Psychiatric History/Hospitalization(s) Patient endorsed history of mood swings anger and irritability and ADD since childhood.  In the past she had tried Focalin, Vyvanse which worked very well for her until she lost her insurance.  She has seen [name] for the management of bipolar disorder and ADD.  She did prescribe lithium which makes her careless, doxepin which helped her depression and quetiapine but patient did not see any improvement.  She also tried temazepam, Ambien which worked for only a short time.  She has given Valium with good response .  Patient denies any history of suicidal attempt but admitted having a strong history of suicidal  ideation last year and she was seen in the emergency room however discharge next day.  Patient was never admitted for inpatient services.  Patient denies any history of paranoia , psychosis or any hallucination but admitted history of severe mood swings irritability impulsive behavior , agitation and crying spells.   Patient endorses history of  Impulsive buying, spending money and panic attack .   Anxiety: Yes Bipolar Disorder: Yes Depression: Yes Mania: Yes Psychosis: No Schizophrenia: No Personality Disorder: No Hospitalization for psychiatric illness: No History of Electroconvulsive Shock Therapy: No Prior Suicide Attempts: No  Medical History; Patient has multiple medical problems.  Her primary care physician is Dr. Lovena Gonzalez. She has hypertension, asthma, GERD, eczema, cystitis, vitamin D deficiency, chronic pain, chronic fatigue, urinary incontinence , migraine headaches, hyperlipidemia and narcolepsy.  Recently she had surgery for appendicitis  Review of Systems: Psychiatric: Agitation: No Hallucination: No Depressed Mood: Yes Insomnia: No Hypersomnia: No Altered Concentration: No Feels Worthless: No Grandiose Ideas: No Belief In Special Powers: No New/Increased Substance Abuse: No Compulsions: No  Neurologic: Headache: Yes Seizure: No Paresthesias: No  Outpatient Encounter Prescriptions as of 11/22/2013  Medication Sig  . ARIPiprazole (ABILIFY) 30 MG tablet Take one tablet by mouth one time daily  . benztropine (COGENTIN) 1 MG tablet Take 1 tablet (1 mg total) by mouth at bedtime.  [name] dexmethylphenidate (FOCALIN XR) 10 MG 24 hr capsule Take 1 capsule (10 mg total) by mouth daily.  [name]  diazepam (VALIUM) 5 MG tablet Take 1 tablet (5 mg total) by mouth 2 (two) times daily as needed for anxiety.  . DULoxetine (CYMBALTA) 30 MG capsule Take 1 capsule (30 mg total) by mouth 2 (two) times daily.  . Linaclotide (LINZESS) 145 MCG CAPS capsule Take 1-2 capsules (145-290 mcg  total) by mouth daily as needed.  . meloxicam (MOBIC) 15 MG tablet Take 1 tablet (15 mg total) by mouth daily as needed for pain.  . metoprolol tartrate (LOPRESSOR) 25 MG tablet Take 1 tablet (25 mg total) by mouth 2 (two) times daily.  . pantoprazole (PROTONIX) 40 MG tablet Take 1 tablet (40 mg total) by mouth daily.  . phenazopyridine (PYRIDIUM) 100 MG tablet Take 1 tablet (100 mg total) by mouth 3 (three) times daily as needed for pain.  . rizatriptan (MAXALT-MLT) 10 MG disintegrating tablet Take 1 tablet (10 mg total) by mouth as needed for migraine. May repeat in 2 hours if needed, maximum 2 tabs a day  . traMADol (ULTRAM) 50 MG tablet   . [DISCONTINUED] ARIPiprazole (ABILIFY) 30 MG tablet Take one tablet by mouth one time daily  . [DISCONTINUED] benztropine (COGENTIN) 1 MG tablet Take 1 tablet (1 mg total) by mouth at bedtime.  . [DISCONTINUED] dexmethylphenidate (FOCALIN XR) 10 MG 24 hr capsule Take 1 capsule (10 mg total) by mouth daily.  . [DISCONTINUED] dexmethylphenidate (FOCALIN) 10 MG tablet Take 1 tablet (10 mg total) by mouth daily.  . [DISCONTINUED] diazepam (VALIUM) 5 MG tablet Take 1 tablet (5 mg total) by mouth 2 (two) times daily as needed for anxiety.    Recent Results (from the past 2160 hour(s))  URINALYSIS W MICROSCOPIC + REFLEX CULTURE     Status: Abnormal   Collection Time    09/09/13  3:14 PM      Result Value Ref Range   Color, Urine YELLOW  YELLOW   APPearance CLOUDY (*) CLEAR   Specific Gravity, Urine <1.005 (*) 1.005 - 1.030   pH 6.0  5.0 - 8.0   Glucose, UA NEG  NEG mg/dL   Bilirubin Urine NEG  NEG   Ketones, ur NEG  NEG mg/dL   Hgb urine dipstick TRACE (*) NEG   Protein, ur NEG  NEG mg/dL   Urobilinogen, UA 0.2  0.0 - 1.0 mg/dL   Nitrite NEG  NEG   Leukocytes, UA MOD (*) NEG   Squamous Epithelial / LPF FEW  RARE   Crystals NONE SEEN  NONE SEEN   Casts NONE SEEN  NONE SEEN   WBC, UA TNTC (*) <3 WBC/hpf   RBC / HPF 3-6 (*) <3 RBC/hpf   Bacteria, UA  MANY (*) RARE  URINE CULTURE     Status: None   Collection Time    09/09/13  3:14 PM      Result Value Ref Range   Colony Count 50,000 COLONIES/ML     Organism ID, Bacteria Multiple bacterial morphotypes present, none     Organism ID, Bacteria predominant. Suggest appropriate recollection if      Organism ID, Bacteria clinically indicated.    URINALYSIS W MICROSCOPIC + REFLEX CULTURE     Status: Abnormal   Collection Time    09/21/13  3:33 PM      Result Value Ref Range   Color, Urine YELLOW  YELLOW   APPearance CLEAR  CLEAR   Specific Gravity, Urine <1.005 (*) 1.005 - 1.030   pH 5.5  5.0 - 8.0   Glucose, UA NEG  NEG mg/dL   Bilirubin Urine NEG  NEG   Ketones, ur NEG  NEG mg/dL   Hgb urine dipstick NEG  NEG   Protein, ur NEG  NEG mg/dL   Urobilinogen, UA 0.2  0.0 - 1.0 mg/dL   Nitrite NEG  NEG   Leukocytes, UA NEG  NEG  CBC WITH DIFFERENTIAL     Status: Abnormal   Collection Time    10/01/13  8:55 AM      Result Value Ref Range   WBC 6.9  4.0 - 10.5 K/uL   RBC 5.40 (*) 3.87 - 5.11 Mil/uL   Hemoglobin 16.8 (*) 12.0 - 15.0 g/dL   HCT 48.9 (*) 36.0 - 46.0 %   MCV 90.7  78.0 - 100.0 fl   MCHC 34.3  30.0 - 36.0 g/dL   RDW 13.2  11.5 - 15.5 %   Platelets 286.0  150.0 - 400.0 K/uL   Neutrophils Relative % 57.6  43.0 - 77.0 %   Lymphocytes Relative 29.1  12.0 - 46.0 %   Monocytes Relative 10.6  3.0 - 12.0 %   Eosinophils Relative 1.7  0.0 - 5.0 %   Basophils Relative 1.0  0.0 - 3.0 %   Neutro Abs 4.0  1.4 - 7.7 K/uL   Lymphs Abs 2.0  0.7 - 4.0 K/uL   Monocytes Absolute 0.7  0.1 - 1.0 K/uL   Eosinophils Absolute 0.1  0.0 - 0.7 K/uL   Basophils Absolute 0.1  0.0 - 0.1 K/uL  BASIC METABOLIC PANEL     Status: None   Collection Time    10/01/13  8:55 AM      Result Value Ref Range   Sodium 139  135 - 145 mEq/L   Potassium 4.3  3.5 - 5.1 mEq/L   Chloride 104  96 - 112 mEq/L   CO2 27  19 - 32 mEq/L   Glucose, Bld 95  70 - 99 mg/dL   BUN 10  6 - 23 mg/dL   Creatinine, Ser 0.6   0.4 - 1.2 mg/dL   Calcium 10.0  8.4 - 10.5 mg/dL   GFR 119.48  >60.00 mL/min  HEMOGLOBIN A1C     Status: None   Collection Time    10/01/13  8:55 AM      Result Value Ref Range   Hemoglobin A1C 5.8  4.6 - 6.5 %   Comment: Glycemic Control Guidelines for People with Diabetes:Non Diabetic:  <6%Goal of Therapy: <7%Additional Action Suggested:  >8%   HEPATIC FUNCTION PANEL     Status: Abnormal   Collection Time    10/01/13  8:55 AM      Result Value Ref Range   Total Bilirubin 0.4  0.2 - 1.2 mg/dL   Bilirubin, Direct 0.0  0.0 - 0.3 mg/dL   Alkaline Phosphatase 106  39 - 117 U/L   AST 15  0 - 37 U/L   ALT 23  0 - 35 U/L   Total Protein 8.8 (*) 6.0 - 8.3 g/dL   Albumin 4.7  3.5 - 5.2 g/dL  LIPID PANEL     Status: Abnormal   Collection Time    10/01/13  8:55 AM      Result Value Ref Range   Cholesterol 225 (*) 0 - 200 mg/dL   Comment: ATP III Classification       Desirable:  < 200 mg/dL               Borderline High:  200 - 239 mg/dL          High:  > = 240 mg/dL   Triglycerides 98.0  0.0 - 149.0 mg/dL   Comment: Normal:  <150 mg/dLBorderline High:  150 - 199 mg/dL   HDL 41.10  >39.00 mg/dL   VLDL 19.6  0.0 - 40.0 mg/dL   LDL Cholesterol 164 (*) 0 - 99 mg/dL   Total CHOL/HDL Ratio 5     Comment:                Men          Women1/2 Average Risk     3.4          3.3Average Risk          5.0          4.42X Average Risk          9.6          7.13X Average Risk          15.0          11.0                       NonHDL 183.90     Comment: NOTE:  Non-HDL goal should be 30 mg/dL higher than patient's LDL goal (i.e. LDL goal of < 70 mg/dL, would have non-HDL goal of < 100 mg/dL)  SEDIMENTATION RATE     Status: None   Collection Time    10/01/13  8:55 AM      Result Value Ref Range   Sed Rate 16  0 - 22 mm/hr  TSH     Status: None   Collection Time    10/01/13  8:55 AM      Result Value Ref Range   TSH 1.83  0.35 - 4.50 uIU/mL  URINALYSIS     Status: None   Collection Time    10/01/13   8:55 AM      Result Value Ref Range   Color, Urine YELLOW  Yellow;Lt. Yellow   APPearance CLEAR  Clear   Specific Gravity, Urine 1.015  1.000-1.030   pH 6.0  5.0 - 8.0   Total Protein, Urine NEGATIVE  Negative   Urine Glucose NEGATIVE  Negative   Ketones, ur NEGATIVE  Negative   Bilirubin Urine NEGATIVE  Negative   Hgb urine dipstick NEGATIVE  Negative   Urobilinogen, UA 0.2  0.0 - 1.0   Leukocytes, UA NEGATIVE  Negative   Nitrite NEGATIVE  Negative    Physical Exam: Constitutional:  BP 143/98  Pulse 105  Ht 5\' 5"  (1.651 m)  Wt 189 lb 9.6 oz (86.002 kg)  BMI 31.55 kg/m2  Musculoskeletal: Strength & Muscle Tone: within normal limits Gait & Station: normal Patient leans: N/A  Mental Status Examination;   Patient is casually dressed and fairly groomed.  She appears anxious but cooperative. Her speech is fast.  Her attention and concentration is fair.  Her thought processes fast but logical.  She described her mood anxious and depressed.  Her affect is constricted.  She denies any auditory or visual hallucination.  She denies any active or passive suicidal thoughts or homicidal thoughts.  Her fund of knowledge is adequate.  There were no tremors or shakes.  There were no delusions, paranoia or any obsession.  There were no flight of ideas or any loose association present at this time.  She is alert and oriented x3.  Her  insight judgment and impulse control is okay.     Assessment: Axis I:  Bipolar disorder NOS, attention deficit disorder by history   Axis II:  Deferred   Axis III:  Past Medical History  Diagnosis Date  . Attention deficit disorder without mention of hyperactivity   . Anxiety state, unspecified   . Pain in joint, site unspecified   . Unspecified asthma(493.90)   . Migraine without aura, without mention of intractable migraine without mention of status migrainosus   . Depressive disorder, not elsewhere classified   . Dysuria   . Family history of diabetes  mellitus   . Family history of asthma   . Family history of ischemic heart disease     fam h/o premature cardiac disease   . Female stress incontinence   . Esophageal reflux   . Unspecified essential hypertension   . Persistent disorder of initiating or maintaining sleep   . Insomnia, unspecified   . Pain in limb   . Tobacco use disorder   . Unspecified vitamin D deficiency   . Abnormal weight gain   . Dyslipidemia     hypertriglyceridemia  . Hypertension   . Chronic constipation 04/01/2013    Axis IV:  Moderate    Plan:  I reviewed collateral information from her primary care physician and recent blood work.  She wanted to try Focalin extended release.  We will switch to extended release Focalin.  She is taking Cogentin and Abilify to help the bipolar disorder.  Recommended to continue Valium 5 mg 1-2 tablets as needed.  We are giving 45 tablet a month.  Patient is getting Cymbalta from her primary care physician.  Recommended to call us back if she has any question or any concern.  Follow-up in 2 months.  Discuss in detail the risks and benefits of medication.  Time spent 25 minutes.  More than 50% of the time spent in psychoeducation, counseling and coordination of care.  Discuss safety plan that anytime having active suicidal thoughts or homicidal thoughts then patient need to call 911 or go to the local emergency room.   ARFEEN,SYED T., MD 11/22/2013

## 2013-11-23 ENCOUNTER — Ambulatory Visit: Payer: Self-pay | Admitting: Pain Medicine

## 2013-11-24 ENCOUNTER — Ambulatory Visit: Payer: Self-pay | Admitting: Pain Medicine

## 2013-11-29 ENCOUNTER — Ambulatory Visit: Payer: Self-pay | Admitting: Pain Medicine

## 2013-11-29 ENCOUNTER — Encounter (HOSPITAL_COMMUNITY): Payer: Self-pay | Admitting: Psychiatry

## 2013-12-02 ENCOUNTER — Other Ambulatory Visit: Payer: Self-pay | Admitting: Gastroenterology

## 2013-12-02 DIAGNOSIS — R1084 Generalized abdominal pain: Secondary | ICD-10-CM

## 2013-12-06 ENCOUNTER — Ambulatory Visit
Admission: RE | Admit: 2013-12-06 | Discharge: 2013-12-06 | Disposition: A | Discharge status: Home / Self Care | Payer: Medicare Other | Source: Ambulatory Visit | Attending: Gastroenterology | Admitting: Gastroenterology

## 2013-12-06 DIAGNOSIS — R1084 Generalized abdominal pain: Secondary | ICD-10-CM

## 2013-12-14 ENCOUNTER — Other Ambulatory Visit: Payer: Self-pay

## 2013-12-14 ENCOUNTER — Ambulatory Visit: Payer: Self-pay | Admitting: Gastroenterology

## 2013-12-14 MED ORDER — MELOXICAM 15 MG PO TABS: 15.0000 mg | ORAL_TABLET | Freq: Every day | ORAL | Status: DC | PRN

## 2013-12-21 ENCOUNTER — Encounter: Payer: Self-pay | Admitting: Internal Medicine

## 2013-12-21 ENCOUNTER — Ambulatory Visit (INDEPENDENT_AMBULATORY_CARE_PROVIDER_SITE_OTHER): Payer: Medicare Other | Admitting: Internal Medicine

## 2013-12-21 ENCOUNTER — Ambulatory Visit: Payer: Self-pay | Admitting: Pain Medicine

## 2013-12-21 VITALS — BP 120/84 | HR 108 | Temp 98.1°F | Ht 66.0 in | Wt 193.5 lb

## 2013-12-21 DIAGNOSIS — G43011 Migraine without aura, intractable, with status migrainosus: Secondary | ICD-10-CM

## 2013-12-21 DIAGNOSIS — I10 Essential (primary) hypertension: Secondary | ICD-10-CM

## 2013-12-21 DIAGNOSIS — F411 Generalized anxiety disorder: Secondary | ICD-10-CM

## 2013-12-21 MED ORDER — NORTRIPTYLINE HCL 10 MG PO CAPS: ORAL_CAPSULE | ORAL | Status: DC

## 2013-12-21 MED ORDER — BUTALBITAL-APAP-CAFFEINE 50-325-40 MG PO TABS: 1.0000 | ORAL_TABLET | Freq: Four times a day (QID) | ORAL | Status: DC | PRN

## 2013-12-21 MED ORDER — KETOROLAC TROMETHAMINE 30 MG/ML IJ SOLN
30.0000 mg | Freq: Once | INTRAMUSCULAR | Status: AC
  Administered 2013-12-21: 30 mg via INTRAMUSCULAR

## 2013-12-21 MED ORDER — PROMETHAZINE HCL 25 MG/ML IJ SOLN
25.0000 mg | Freq: Once | INTRAMUSCULAR | Status: AC
  Administered 2013-12-21: 25 mg via INTRAMUSCULAR

## 2013-12-21 NOTE — Patient Instructions (Addendum)
You had the pain and nausea shots today (toradol, and phenergan)  Please take all new medication as prescribed - the fioricet only as needed for further headache  Please take all new medication as prescribed - the nortryptilene; as this comes in 10 mg pills, please start 1 at bedtime for 1 wk, then 2 at bedtime for 1 wk, then 3 tabs at bedtime after that  Please continue all other medications as before, and refills have been done if requested.  Please have the pharmacy call with any other refills you may need.  Please keep your appointments with your specialists as you may have planned

## 2013-12-21 NOTE — Assessment & Plan Note (Signed)
stable overall by history and exam, recent data reviewed with pt, and pt to continue medical treatment as before,  to f/u any worsening symptoms or concerns  Lab Results  Component Value Date   WBC 6.9 10/01/2013   HGB 16.8* 10/01/2013   HCT 48.9* 10/01/2013   PLT 286.0 10/01/2013   GLUCOSE 95 10/01/2013   CHOL 225* 10/01/2013   TRIG 98.0 10/01/2013   HDL 41.10 10/01/2013   LDLDIRECT 161.1 02/18/2013   LDLCALC 164* 10/01/2013   ALT 23 10/01/2013   AST 15 10/01/2013   NA 139 10/01/2013   K 4.3 10/01/2013   CL 104 10/01/2013   CREATININE 0.6 10/01/2013   BUN 10 10/01/2013   CO2 27 10/01/2013   TSH 1.83 10/01/2013   HGBA1C 5.8 10/01/2013

## 2013-12-21 NOTE — Progress Notes (Signed)
Subjective:    Patient ID: [name], female    DOB: 1970-07-29, 43 y.o.   MRN: [id number]  HPI  Here with 11 days daily mod sharp HA, with 4 days worsening now with vomiting and dizziness.  HA located more right occipital assoc with photophobia, and n/v, but no blurry vision. Maxalt did not help yesterday. Nothing else seems to make better or worse. Also take mobic without success.   Pt denies new neurological symptoms such as or facial or extremity weakness or numbness  .  Was better before with nortryptilene, stopped x 3 mo to see clinical effect.  Still vomiting today.  No fever, other worsening abd, diarrhea. Denies worsening depressive symptoms, suicidal ideation, or panic. Pt denies chest pain, increased sob or doe, wheezing, orthopnea, PND, increased LE swelling, palpitations, dizziness or syncope. Past Medical History  Diagnosis Date  . Attention deficit disorder without mention of hyperactivity   . Anxiety state, unspecified   . Pain in joint, site unspecified   . Unspecified asthma(493.90)   . Migraine without aura, without mention of intractable migraine without mention of status migrainosus   . Depressive disorder, not elsewhere classified   . Dysuria   . Family history of diabetes mellitus   . Family history of asthma   . Family history of ischemic heart disease     fam h/o premature cardiac disease   . Female stress incontinence   . Esophageal reflux   . Unspecified essential hypertension   . Persistent disorder of initiating or maintaining sleep   . Insomnia, unspecified   . Pain in limb   . Tobacco use disorder   . Unspecified vitamin D deficiency   . Abnormal weight gain   . Dyslipidemia     hypertriglyceridemia  . Hypertension   . Chronic constipation 04/01/2013   Past Surgical History  Procedure Laterality Date  . Partial hysterectomy  2007  . Tonsillectomy  04/29/00  . Incise and drain abcess  2011    Right axilla- Dr. Barkley Bruns  . Abdominal ultrasound   10/12/97  . Plantar fascia surgery  2000  . Transthoracic echocardiogram  11/2009    EF=>55%; trace MR & TR  . Nm myocar perf wall motion  11/2009    bruce myoview; perfusion defect in anterior region consistent with breast attenuation; remaining myocardium with normal perfusion; post-stress EF 74%; low risk scan   . Appendectomy  05/27/2013    gangrenous    reports that she has been smoking Cigarettes.  She has been smoking about 1.00 pack per day. She has never used smokeless tobacco. She reports that she does not drink alcohol or use illicit drugs. family history includes ADD / ADHD in her child; Alcohol abuse in her brother and sister; Asthma in her child and another family member; Bipolar disorder in her sister; Breast cancer in her mother; COPD in her paternal grandmother; Cancer (age of onset: 51) in her mother; Coronary artery disease in an other family member; Depression in her mother; Diabetes in her paternal grandfather and another family member; Heart disease in her father, maternal grandfather, and paternal grandfather; Lupus in her sister; Multiple sclerosis in her mother. Allergies  Allergen Reactions  . Amoxicillin     REACTION: QUESTIONABLE  . Doxycycline     REACTION: vomiting  . Hydrochlorothiazide     REACTION: CRAMPS AT HIGHER DOSAGES  . Lisinopril     REACTION: COUGH  . Penicillins   . Percocet [Oxycodone-Acetaminophen]  itching  . Talwin [Pentazocine]     n/v   Current Outpatient Prescriptions on File Prior to Visit  Medication Sig Dispense Refill  . ARIPiprazole (ABILIFY) 30 MG tablet Take one tablet by mouth one time daily 30 tablet 1  . benztropine (COGENTIN) 1 MG tablet Take 1 tablet (1 mg total) by mouth at bedtime. 30 tablet 1  . dexmethylphenidate (FOCALIN XR) 10 MG 24 hr capsule Take 1 capsule (10 mg total) by mouth daily. 30 capsule 0  . diazepam (VALIUM) 5 MG tablet Take 1 tablet (5 mg total) by mouth 2 (two) times daily as needed for anxiety. 45  tablet 1  . DULoxetine (CYMBALTA) 30 MG capsule Take 1 capsule (30 mg total) by mouth 2 (two) times daily. 60 capsule 3  . Linaclotide (LINZESS) 145 MCG CAPS capsule Take 1-2 capsules (145-290 mcg total) by mouth daily as needed. 60 capsule 5  . meloxicam (MOBIC) 15 MG tablet Take 1 tablet (15 mg total) by mouth daily as needed for pain. 30 tablet 3  . metoprolol tartrate (LOPRESSOR) 25 MG tablet Take 1 tablet (25 mg total) by mouth 2 (two) times daily. 180 tablet 3  . pantoprazole (PROTONIX) 40 MG tablet Take 1 tablet (40 mg total) by mouth daily. 90 tablet 3  . phenazopyridine (PYRIDIUM) 100 MG tablet Take 1 tablet (100 mg total) by mouth 3 (three) times daily as needed for pain. 20 tablet 0  . rizatriptan (MAXALT-MLT) 10 MG disintegrating tablet Take 1 tablet (10 mg total) by mouth as needed for migraine. May repeat in 2 hours if needed, maximum 2 tabs a day 15 tablet 12  . traMADol (ULTRAM) 50 MG tablet      No current facility-administered medications on file prior to visit.   Review of Systems  Constitutional: Negative for unusual diaphoresis or other sweats  HENT: Negative for ringing in ear Eyes: Negative for double vision or worsening visual disturbance.  Respiratory: Negative for choking and stridor.   Gastrointestinal: Negative for vomiting or other signifcant bowel change Genitourinary: Negative for hematuria or decreased urine volume.  Musculoskeletal: Negative for other MSK pain or swelling Skin: Negative for color change and worsening wound.  Neurological: Negative for tremors and numbness other than noted  Psychiatric/Behavioral: Negative for decreased concentration or agitation other than above       Objective:   Physical Exam BP 120/84 mmHg  Pulse 108  Temp(Src) 98.1 F (36.7 C) (Oral)  Ht 5\' 6"  (1.676 m)  Wt 193 lb 8 oz (87.771 kg)  BMI 31.25 kg/m2  SpO2 97% VS noted,  Constitutional: Pt appears well-developed, well-nourished.  HENT: Head: NCAT.  Right Ear:  External ear normal.  Left Ear: External ear normal.  Eyes: . Pupils are equal, round, and reactive to light. Conjunctivae and EOM are normal Neck: Normal range of motion. Neck supple.  Cardiovascular: Normal rate and regular rhythm.   Pulmonary/Chest: Effort normal and breath sounds normal.  Neurological: Pt is alert. Not confused , motor grossly intact, sens intact, gait intact, cn 2-12 intact Skin: Skin is warm. No rash Psychiatric: Pt behavior is normal. No agitation. mild nervous, not depressed affect    Assessment & Plan:

## 2013-12-21 NOTE — Progress Notes (Signed)
Pre visit review using our clinic review tool, if applicable. No additional management support is needed unless otherwise documented below in the visit note. 

## 2013-12-21 NOTE — Assessment & Plan Note (Signed)
stable overall by history and exam, recent data reviewed with pt, and pt to continue medical treatment as before,  to f/u any worsening symptoms or concerns BP Readings from Last 3 Encounters:  12/21/13 120/84  11/22/13 143/98  10/01/13 118/90

## 2013-12-21 NOTE — Assessment & Plan Note (Signed)
For toradol/phenergan IM now, then fioricet prn limited tx, then start nortryptilene as before with target dose 30 mg qhs

## 2013-12-22 ENCOUNTER — Ambulatory Visit: Payer: Self-pay | Admitting: Internal Medicine

## 2013-12-27 ENCOUNTER — Ambulatory Visit: Payer: Self-pay | Admitting: Pain Medicine

## 2014-01-17 ENCOUNTER — Ambulatory Visit: Payer: Self-pay | Admitting: Pain Medicine

## 2014-01-24 ENCOUNTER — Ambulatory Visit (HOSPITAL_COMMUNITY): Payer: Self-pay | Admitting: Psychiatry

## 2014-02-03 ENCOUNTER — Ambulatory Visit (INDEPENDENT_AMBULATORY_CARE_PROVIDER_SITE_OTHER): Payer: 59 | Admitting: Psychiatry

## 2014-02-03 ENCOUNTER — Encounter (HOSPITAL_COMMUNITY): Payer: Self-pay | Admitting: Psychiatry

## 2014-02-03 DIAGNOSIS — F909 Attention-deficit hyperactivity disorder, unspecified type: Secondary | ICD-10-CM

## 2014-02-03 DIAGNOSIS — F988 Other specified behavioral and emotional disorders with onset usually occurring in childhood and adolescence: Secondary | ICD-10-CM

## 2014-02-03 DIAGNOSIS — F319 Bipolar disorder, unspecified: Secondary | ICD-10-CM

## 2014-02-03 MED ORDER — DIAZEPAM 5 MG PO TABS
5.0000 mg | ORAL_TABLET | Freq: Two times a day (BID) | ORAL | Status: DC | PRN
Start: 2014-02-03 — End: 2014-04-07

## 2014-02-03 MED ORDER — DEXMETHYLPHENIDATE HCL ER 10 MG PO CP24: 10.0000 mg | ORAL_CAPSULE | Freq: Every day | ORAL | Status: DC

## 2014-02-03 MED ORDER — BENZTROPINE MESYLATE 1 MG PO TABS: 1.0000 mg | ORAL_TABLET | Freq: Every day | ORAL | Status: DC

## 2014-02-03 MED ORDER — ARIPIPRAZOLE 30 MG PO TABS: ORAL_TABLET | ORAL | Status: DC

## 2014-02-03 NOTE — Progress Notes (Signed)
[place] [phone] Progress Note  [name] [id number] 44 y.o.  02/03/2014 11:42 AM  Chief Complaint:  Medication management and follow-up.       History of Present Illness:  Derrick came for her followup appointment.  She is feeling better with stimulant , Abilify and Valium.  She is able to do multitasking.  Her mood has been stable and she denies any anger or any irritability.  She recently seen Dr. Dorann Lodge off who started her on nortriptyline for headache.  She has migraine headache and she takes Fioricet , tramadol and Mobic as needed.  Patient appears very calm and feeling better.  She had good Christmas.  She denies any paranoia or any hallucination.  She reported her depression and mood swings are under control.  Her appetite is okay.  Patient lives with her mother and her husband however her husband usually travels and mostly out of the town.  Patient is also taking Cymbalta from her primary care physician.  Suicidal Ideation: No Plan Formed: No Patient has means to carry out plan: No  Homicidal Ideation: No Plan Formed: No Patient has means to carry out plan: No  Past Psychiatric History/Hospitalization(s) Patient endorsed history of mood swings anger and irritability and ADD since childhood.  In the past she had tried Focalin, Vyvanse which worked very well for her until she lost her insurance.  She has seen Eartha Inch for the management of bipolar disorder and ADD.  She did prescribe lithium which makes her careless, doxepin which helped her depression and quetiapine but patient did not see any improvement.  She also tried temazepam, Ambien which worked for only a short time.  She has given Valium with good response .  Patient denies any history of suicidal attempt but admitted having a strong history of suicidal ideation last year and she was seen in the emergency room however discharge next day.  Patient was never admitted for inpatient services.  Patient denies any  history of paranoia , psychosis or any hallucination but admitted history of severe mood swings irritability impulsive behavior , agitation and crying spells.   Patient endorses history of  Impulsive buying, spending money and panic attack .   Anxiety: Yes Bipolar Disorder: Yes Depression: Yes Mania: Yes Psychosis: No Schizophrenia: No Personality Disorder: No Hospitalization for psychiatric illness: No History of Electroconvulsive Shock Therapy: No Prior Suicide Attempts: No  Medical History; Patient has multiple medical problems.  Her primary care physician is Dr. Lovena Le. She has hypertension, asthma, GERD, eczema, cystitis, vitamin D deficiency, chronic pain, chronic fatigue, urinary incontinence , migraine headaches, hyperlipidemia and narcolepsy.  Recently she had surgery for appendicitis  Review of Systems: Psychiatric: Agitation: No Hallucination: No Depressed Mood: No Insomnia: No Hypersomnia: No Altered Concentration: No Feels Worthless: No Grandiose Ideas: No Belief In Special Powers: No New/Increased Substance Abuse: No Compulsions: No  Neurologic: Headache: Yes Seizure: No Paresthesias: No  Outpatient Encounter Prescriptions as of 02/03/2014  Medication Sig  . ARIPiprazole (ABILIFY) 30 MG tablet Take one tablet by mouth one time daily  . benztropine (COGENTIN) 1 MG tablet Take 1 tablet (1 mg total) by mouth at bedtime.  . butalbital-acetaminophen-caffeine (FIORICET) 50-325-40 MG per tablet Take 1 tablet by mouth every 6 (six) hours as needed for headache.  . dexmethylphenidate (FOCALIN XR) 10 MG 24 hr capsule Take 1 capsule (10 mg total) by mouth daily.  . diazepam (VALIUM) 5 MG tablet Take 1 tablet (5 mg total) by mouth 2 (two)  times daily as needed for anxiety.  . DULoxetine (CYMBALTA) 30 MG capsule Take 1 capsule (30 mg total) by mouth 2 (two) times daily.  . Linaclotide (LINZESS) 145 MCG CAPS capsule Take 1-2 capsules (145-290 mcg total) by mouth daily as  needed.  . meloxicam (MOBIC) 15 MG tablet Take 1 tablet (15 mg total) by mouth daily as needed for pain.  . metoprolol tartrate (LOPRESSOR) 25 MG tablet Take 1 tablet (25 mg total) by mouth 2 (two) times daily.  . nortriptyline (PAMELOR) 10 MG capsule 3 tabs by mouth at bedtime  . pantoprazole (PROTONIX) 40 MG tablet Take 1 tablet (40 mg total) by mouth daily.  . traMADol (ULTRAM) 50 MG tablet   . [DISCONTINUED] ARIPiprazole (ABILIFY) 30 MG tablet Take one tablet by mouth one time daily  . [DISCONTINUED] benztropine (COGENTIN) 1 MG tablet Take 1 tablet (1 mg total) by mouth at bedtime.  . [DISCONTINUED] dexmethylphenidate (FOCALIN XR) 10 MG 24 hr capsule Take 1 capsule (10 mg total) by mouth daily.  . [DISCONTINUED] dexmethylphenidate (FOCALIN XR) 10 MG 24 hr capsule Take 1 capsule (10 mg total) by mouth daily.  . [DISCONTINUED] diazepam (VALIUM) 5 MG tablet Take 1 tablet (5 mg total) by mouth 2 (two) times daily as needed for anxiety.  . [DISCONTINUED] phenazopyridine (PYRIDIUM) 100 MG tablet Take 1 tablet (100 mg total) by mouth 3 (three) times daily as needed for pain.  . [DISCONTINUED] rizatriptan (MAXALT-MLT) 10 MG disintegrating tablet Take 1 tablet (10 mg total) by mouth as needed for migraine. May repeat in 2 hours if needed, maximum 2 tabs a day    No results found for this or any previous visit (from the past 2160 hour(s)).  Physical Exam: Constitutional:  There were no vitals taken for this visit.  Musculoskeletal: Strength & Muscle Tone: within normal limits Gait & Station: normal Patient leans: N/A  Mental Status Examination;   Patient is casually dressed and fairly groomed.  She is pleasant, cooperative and maintained good eye contact. Her speech is fast but clear and coherent  Her attention and concentration is fair.  Her thought processes is logical.  She described her mood is pleasant and her affect is mood appropriate. She denies any auditory or visual hallucination.  She  denies any active or passive suicidal thoughts or homicidal thoughts.  Her fund of knowledge is adequate.  There were no tremors or shakes.  There were no delusions, paranoia or any obsession.  There were no flight of ideas or any loose association present at this time.  She is alert and oriented x3.  Her insight judgment and impulse control is okay.     Assessment: Axis I:  Bipolar disorder NOS, attention deficit disorder by history   Axis II:  Deferred   Axis III:  Past Medical History  Diagnosis Date  . Attention deficit disorder without mention of hyperactivity   . Anxiety state, unspecified   . Pain in joint, site unspecified   . Unspecified asthma(493.90)   . Migraine without aura, without mention of intractable migraine without mention of status migrainosus   . Depressive disorder, not elsewhere classified   . Dysuria   . Family history of diabetes mellitus   . Family history of asthma   . Family history of ischemic heart disease     fam h/o premature cardiac disease   . Female stress incontinence   . Esophageal reflux   . Unspecified essential hypertension   . Persistent disorder of initiating  or maintaining sleep   . Insomnia, unspecified   . Pain in limb   . Tobacco use disorder   . Unspecified vitamin D deficiency   . Abnormal weight gain   . Dyslipidemia     hypertriglyceridemia  . Hypertension   . Chronic constipation 04/01/2013    Axis IV:  Moderate    Plan:  Patient is doing better on her current psychotropic medication.  I will continue Focalin XR 10 mg daily, Cogentin 1 mg daily, Abilify 30 mg daily and Valium 5 mg 1-2 tablet as needed.  She is getting nortriptyline and Cymbalta from her primary care physician.  Discussed medication side effects, and direction and benefits in detail.  Recommended to call us back if she has any question or any concern.  Follow-up in 2 months.   Regina Coppolino T., MD 02/03/2014

## 2014-02-07 ENCOUNTER — Other Ambulatory Visit: Payer: Self-pay

## 2014-02-09 ENCOUNTER — Other Ambulatory Visit: Payer: Self-pay | Admitting: *Deleted

## 2014-02-09 MED ORDER — DULOXETINE HCL 30 MG PO CPEP: 30.0000 mg | ORAL_CAPSULE | Freq: Two times a day (BID) | ORAL | Status: DC

## 2014-02-15 ENCOUNTER — Other Ambulatory Visit: Payer: Self-pay

## 2014-02-15 ENCOUNTER — Telehealth (HOSPITAL_COMMUNITY): Payer: Self-pay

## 2014-02-15 DIAGNOSIS — Z1231 Encounter for screening mammogram for malignant neoplasm of breast: Secondary | ICD-10-CM

## 2014-02-15 MED ORDER — LISDEXAMFETAMINE DIMESYLATE 30 MG PO CAPS: 30.0000 mg | ORAL_CAPSULE | Freq: Every day | ORAL | Status: DC

## 2014-02-15 NOTE — Telephone Encounter (Signed)
Anessia picked up her prescription on 7/86/75  [place] [facility] [id number]  dlo

## 2014-02-15 NOTE — Telephone Encounter (Signed)
I returned patient's phone call.  She is complaining of increased irritability, anger and having more anxiety.  She has noticed change in her mood in recent months.  She was taking Vyvanse and be switched to Focalin.  I recommended to stop the Focalin and go back on Vyvanse 30 mg.  She does not want to stop stimulant because it is helping her focus and she is able to do multitasking.  A new prescription of Vyvanse 30 mg is given and patient will come and pick up the prescription.  Recommended to call us back if she does not see any improvement.

## 2014-02-15 NOTE — Telephone Encounter (Signed)
Patient called for a second time since appointment 02/03/14 stating she is continuing to experience increased anxiety and is "lashing out at those I love".  Patient questions if needs to be seen earlier or medications adjusted.  Requests follow up from provider.

## 2014-02-16 ENCOUNTER — Ambulatory Visit: Payer: Self-pay | Admitting: Pain Medicine

## 2014-02-16 DIAGNOSIS — M4806 Spinal stenosis, lumbar region: Secondary | ICD-10-CM | POA: Diagnosis not present

## 2014-02-16 DIAGNOSIS — M47896 Other spondylosis, lumbar region: Secondary | ICD-10-CM | POA: Diagnosis not present

## 2014-02-16 DIAGNOSIS — F1120 Opioid dependence, uncomplicated: Secondary | ICD-10-CM | POA: Diagnosis not present

## 2014-02-16 DIAGNOSIS — Z79891 Long term (current) use of opiate analgesic: Secondary | ICD-10-CM | POA: Diagnosis not present

## 2014-02-16 DIAGNOSIS — G894 Chronic pain syndrome: Secondary | ICD-10-CM | POA: Diagnosis not present

## 2014-02-16 DIAGNOSIS — M47817 Spondylosis without myelopathy or radiculopathy, lumbosacral region: Secondary | ICD-10-CM | POA: Diagnosis not present

## 2014-02-16 DIAGNOSIS — Z79899 Other long term (current) drug therapy: Secondary | ICD-10-CM | POA: Diagnosis not present

## 2014-02-22 ENCOUNTER — Ambulatory Visit: Payer: Self-pay

## 2014-03-03 ENCOUNTER — Ambulatory Visit
Admission: RE | Admit: 2014-03-03 | Discharge: 2014-03-03 | Disposition: A | Discharge status: Home / Self Care | Payer: Medicare Other | Source: Ambulatory Visit

## 2014-03-03 ENCOUNTER — Other Ambulatory Visit (HOSPITAL_COMMUNITY): Payer: Self-pay

## 2014-03-03 DIAGNOSIS — Z1231 Encounter for screening mammogram for malignant neoplasm of breast: Secondary | ICD-10-CM

## 2014-03-03 NOTE — Telephone Encounter (Signed)
Telephone call with patient after receiving a refill request from [facility] wanting an order for patient's Abilify.  Discussed with patient this was e-scribed in to [facility] in [place].  Patient stated plan to call [facility] and have them get orders transferred over to them from [facility] as she acknowledged she forgot to request change from Dr. Adele Schilder when last evaluated.   Patient to call back if any further problems getting medication refill.

## 2014-03-07 ENCOUNTER — Other Ambulatory Visit: Payer: Self-pay | Admitting: Geriatric Medicine

## 2014-03-07 MED ORDER — LINACLOTIDE 145 MCG PO CAPS: 145.0000 ug | ORAL_CAPSULE | Freq: Every day | ORAL | Status: DC | PRN

## 2014-03-16 ENCOUNTER — Other Ambulatory Visit (INDEPENDENT_AMBULATORY_CARE_PROVIDER_SITE_OTHER): Payer: Medicare Other

## 2014-03-16 ENCOUNTER — Other Ambulatory Visit: Payer: Self-pay | Admitting: Internal Medicine

## 2014-03-16 ENCOUNTER — Ambulatory Visit (INDEPENDENT_AMBULATORY_CARE_PROVIDER_SITE_OTHER): Payer: BLUE CROSS/BLUE SHIELD | Admitting: Internal Medicine

## 2014-03-16 ENCOUNTER — Encounter: Payer: Self-pay | Admitting: Internal Medicine

## 2014-03-16 VITALS — BP 120/100 | HR 143 | Temp 98.5°F | Wt 191.0 lb

## 2014-03-16 DIAGNOSIS — R42 Dizziness and giddiness: Secondary | ICD-10-CM | POA: Diagnosis not present

## 2014-03-16 DIAGNOSIS — J4520 Mild intermittent asthma, uncomplicated: Secondary | ICD-10-CM

## 2014-03-16 DIAGNOSIS — I10 Essential (primary) hypertension: Secondary | ICD-10-CM | POA: Diagnosis not present

## 2014-03-16 DIAGNOSIS — D751 Secondary polycythemia: Secondary | ICD-10-CM

## 2014-03-16 LAB — CBC WITH DIFFERENTIAL/PLATELET
BASOS ABS: 0 10*3/uL (ref 0.0–0.1)
Basophils Relative: 0.6 % (ref 0.0–3.0)
EOS ABS: 0.2 10*3/uL (ref 0.0–0.7)
Eosinophils Relative: 2.2 % (ref 0.0–5.0)
HCT: 52.4 % — ABNORMAL HIGH (ref 36.0–46.0)
Hemoglobin: 18.3 g/dL (ref 12.0–15.0)
LYMPHS PCT: 29.2 % (ref 12.0–46.0)
Lymphs Abs: 2.1 10*3/uL (ref 0.7–4.0)
MCHC: 34.8 g/dL (ref 30.0–36.0)
MCV: 89.5 fl (ref 78.0–100.0)
Monocytes Absolute: 0.7 10*3/uL (ref 0.1–1.0)
Monocytes Relative: 10.6 % (ref 3.0–12.0)
Neutro Abs: 4 10*3/uL (ref 1.4–7.7)
Neutrophils Relative %: 57.4 % (ref 43.0–77.0)
Platelets: 253 10*3/uL (ref 150.0–400.0)
RBC: 5.85 Mil/uL — ABNORMAL HIGH (ref 3.87–5.11)
RDW: 12.3 % (ref 11.5–15.5)
WBC: 7 10*3/uL (ref 4.0–10.5)

## 2014-03-16 LAB — BASIC METABOLIC PANEL
BUN: 16 mg/dL (ref 6–23)
CO2: 25 meq/L (ref 19–32)
Calcium: 10.4 mg/dL (ref 8.4–10.5)
Chloride: 97 mEq/L (ref 96–112)
Creatinine, Ser: 0.81 mg/dL (ref 0.40–1.20)
GFR: 82.69 mL/min (ref >60.00)
GLUCOSE: 133 mg/dL — AB (ref 70–99)
Potassium: 4.4 mEq/L (ref 3.5–5.1)
SODIUM: 136 meq/L (ref 135–145)

## 2014-03-16 LAB — HEPATIC FUNCTION PANEL
ALBUMIN: 4.9 g/dL (ref 3.5–5.2)
ALK PHOS: 141 U/L — AB (ref 39–117)
ALT: 19 U/L (ref 0–35)
AST: 14 U/L (ref 0–37)
BILIRUBIN TOTAL: 0.3 mg/dL (ref 0.2–1.2)
Bilirubin, Direct: 0 mg/dL (ref 0.0–0.3)
Total Protein: 8.5 g/dL — ABNORMAL HIGH (ref 6.0–8.3)

## 2014-03-16 MED ORDER — ASPIRIN 325 MG PO TABS: 325.0000 mg | ORAL_TABLET | Freq: Every day | ORAL | Status: DC

## 2014-03-16 MED ORDER — MECLIZINE HCL 12.5 MG PO TABS: 12.5000 mg | ORAL_TABLET | Freq: Three times a day (TID) | ORAL | Status: DC | PRN

## 2014-03-16 NOTE — Progress Notes (Signed)
Subjective:   C/o dizzy spells at times since Dec 2015 a couple times a week. No N/V. No new meds. No ETOH  F/u recent appendectomy 05/27/13. C/o severe pain w/each intercourse.Marland KitchenMarland KitchenSeeing Dr Toney Rakes. C/o lower abd pains post-appendectomy... Lost 100 lbs in 2 years on diet  F/u on bipolar disorder: cont to see Dr Adele Schilder F/u HAs - better She [facility] Dr Primus Bravo  Dizziness Associated symptoms include coughing and fatigue. Pertinent negatives include no chills, congestion, nausea or rash.  Anxiety Symptoms include dizziness and nervous/anxious behavior. Patient reports no confusion, decreased concentration, nausea, shortness of breath or suicidal ideas.      The patient is here to follow up on chronic bipolar depression, anxiety, headaches and chronic moderate fibromyalgia and LBP symptoms partially controlled with medicines, diet and exercise. F/u HTN, asthma.  She is on [employer] disability for her bipolar  Wt Readings from Last 3 Encounters:  03/16/14 191 lb (86.637 kg)  12/21/13 193 lb 8 oz (87.771 kg)  11/22/13 189 lb 9.6 oz (86.002 kg)   BP Readings from Last 3 Encounters:  03/16/14 120/100  12/21/13 120/84  11/22/13 143/98       Review of Systems  Constitutional: Positive for fatigue. Negative for chills, activity change, appetite change and unexpected weight change.  HENT: Negative for congestion, mouth sores and sinus pressure.   Eyes: Negative for visual disturbance.  Respiratory: Positive for cough. Negative for chest tightness and shortness of breath.   Gastrointestinal: Negative for nausea.  Genitourinary: Negative for frequency, difficulty urinating and vaginal pain.  Musculoskeletal: Negative for gait problem.  Skin: Negative for pallor and rash.  Neurological: Positive for dizziness. Negative for tremors.  Psychiatric/Behavioral: Positive for behavioral problems, sleep disturbance and dysphoric mood. Negative for suicidal ideas,  confusion and decreased concentration. The patient is nervous/anxious.        Objective:   Physical Exam  Constitutional: She appears well-developed. No distress.  obese  HENT:  Head: Normocephalic.  Right Ear: External ear normal.  Left Ear: External ear normal.  Nose: Nose normal.  Mouth/Throat: Oropharynx is clear and moist.  Eyes: Conjunctivae are normal. Pupils are equal, round, and reactive to light. Right eye exhibits no discharge. Left eye exhibits no discharge.  Neck: Normal range of motion. Neck supple. No JVD present. No tracheal deviation present. No thyromegaly present.  Cardiovascular: Normal rate, regular rhythm and normal heart sounds.   Pulmonary/Chest: No stridor. No respiratory distress. She has no wheezes.  Abdominal: Soft. Bowel sounds are normal. She exhibits no distension and no mass. There is no tenderness. There is no rebound and no guarding.  Musculoskeletal: She exhibits no edema or tenderness.  Lymphadenopathy:    She has no cervical adenopathy.  Neurological: She displays tremor. She displays normal reflexes. No cranial nerve deficit. She exhibits normal muscle tone. Coordination normal.  Skin: No rash noted. No erythema.  Psychiatric: Her behavior is normal. Judgment and thought content normal. Her mood appears not anxious. Thought content is not paranoid. She does not exhibit a depressed mood. She expresses no homicidal and no suicidal ideation.  H-P a little (+) on R  Lab Results  Component Value Date   WBC 6.9 10/01/2013   HGB 16.8* 10/01/2013   HCT 48.9* 10/01/2013   PLT 286.0 10/01/2013   GLUCOSE 95 10/01/2013   CHOL 225* 10/01/2013   TRIG 98.0 10/01/2013   HDL 41.10 10/01/2013   LDLDIRECT 161.1 02/18/2013   LDLCALC 164*  10/01/2013   ALT 23 10/01/2013   AST 15 10/01/2013   NA 139 10/01/2013   K 4.3 10/01/2013   CL 104 10/01/2013   CREATININE 0.6 10/01/2013   BUN 10 10/01/2013   CO2 27 10/01/2013   TSH 1.83 10/01/2013   HGBA1C 5.8  10/01/2013           Assessment & Plan:

## 2014-03-16 NOTE — Patient Instructions (Signed)
Benign Positional Vertigo symptoms on the right Start [place] exercise several times a day as dirrected. No driving if dizzy

## 2014-03-16 NOTE — Assessment & Plan Note (Signed)
Benign Positional Vertigo symptoms on the right Start [place] exercise several times a day as dirrected.

## 2014-03-16 NOTE — Assessment & Plan Note (Signed)
Continue with current prescription therapy as reflected on the Med list.  

## 2014-03-16 NOTE — Assessment & Plan Note (Signed)
Continue with current prescription therapy as reflected on the Med list. BP Readings from Last 3 Encounters:  03/16/14 120/100  12/21/13 120/84  11/22/13 143/98

## 2014-03-16 NOTE — Progress Notes (Signed)
Pre visit review using our clinic review tool, if applicable. No additional management support is needed unless otherwise documented below in the visit note. 

## 2014-03-17 ENCOUNTER — Other Ambulatory Visit (HOSPITAL_COMMUNITY): Payer: Self-pay | Admitting: Psychiatry

## 2014-03-17 ENCOUNTER — Telehealth (HOSPITAL_COMMUNITY): Payer: Self-pay | Admitting: *Deleted

## 2014-03-17 MED ORDER — LISDEXAMFETAMINE DIMESYLATE 30 MG PO CAPS: 30.0000 mg | ORAL_CAPSULE | Freq: Every day | ORAL | Status: DC

## 2014-03-17 NOTE — Telephone Encounter (Signed)
Dr. Adele Schilder,   Patient called and left message on nurse's voice mail. Patient stated that she was told to call and let you know if Vyvanse is working.  Patient stated Vyvanse is helping, much improved on it. Patient stated that she needs a refill. Her next appointment is scheduled for 04-04-14. Do you want to refill?   Thank you

## 2014-03-18 ENCOUNTER — Telehealth: Payer: Self-pay | Admitting: Oncology

## 2014-03-18 NOTE — Telephone Encounter (Signed)
Pt confirmed appt. for 04/14/14 at 10:30am for [facility] Dx: Polycythemia Referring Dr. Lauraine Rinne

## 2014-03-18 NOTE — Telephone Encounter (Signed)
Patient picked up RX MGQ:[id number]

## 2014-03-21 ENCOUNTER — Telehealth: Payer: Self-pay | Admitting: Internal Medicine

## 2014-03-21 ENCOUNTER — Encounter: Payer: Self-pay | Admitting: Internal Medicine

## 2014-03-21 ENCOUNTER — Telehealth: Payer: Self-pay | Admitting: Oncology

## 2014-03-21 NOTE — Telephone Encounter (Signed)
Chart delivered 03/21/14 TG

## 2014-03-21 NOTE — Telephone Encounter (Signed)
Patient requesting that she would like referral for oncology moved to [facility] because it is closer to her home. Please review. thnk you.

## 2014-03-24 ENCOUNTER — Ambulatory Visit: Payer: Self-pay | Admitting: Pain Medicine

## 2014-03-24 ENCOUNTER — Other Ambulatory Visit (INDEPENDENT_AMBULATORY_CARE_PROVIDER_SITE_OTHER): Payer: Medicare Other

## 2014-03-24 DIAGNOSIS — D751 Secondary polycythemia: Secondary | ICD-10-CM

## 2014-03-24 DIAGNOSIS — M47894 Other spondylosis, thoracic region: Secondary | ICD-10-CM | POA: Diagnosis not present

## 2014-03-24 DIAGNOSIS — M62838 Other muscle spasm: Secondary | ICD-10-CM | POA: Diagnosis not present

## 2014-03-24 DIAGNOSIS — R0782 Intercostal pain: Secondary | ICD-10-CM | POA: Diagnosis not present

## 2014-03-24 DIAGNOSIS — M47896 Other spondylosis, lumbar region: Secondary | ICD-10-CM | POA: Diagnosis not present

## 2014-03-24 DIAGNOSIS — M791 Myalgia: Secondary | ICD-10-CM | POA: Diagnosis not present

## 2014-03-24 DIAGNOSIS — M5124 Other intervertebral disc displacement, thoracic region: Secondary | ICD-10-CM | POA: Diagnosis not present

## 2014-03-24 DIAGNOSIS — M47817 Spondylosis without myelopathy or radiculopathy, lumbosacral region: Secondary | ICD-10-CM | POA: Diagnosis not present

## 2014-03-24 DIAGNOSIS — M5416 Radiculopathy, lumbar region: Secondary | ICD-10-CM | POA: Diagnosis not present

## 2014-03-24 LAB — CBC
HCT: 46.2 % — ABNORMAL HIGH (ref 36.0–46.0)
Hemoglobin: 16.2 g/dL — ABNORMAL HIGH (ref 12.0–15.0)
MCHC: 35.1 g/dL (ref 30.0–36.0)
MCV: 89.1 fl (ref 78.0–100.0)
Platelets: 241 10*3/uL (ref 150.0–400.0)
RBC: 5.18 Mil/uL — ABNORMAL HIGH (ref 3.87–5.11)
RDW: 12.6 % (ref 11.5–15.5)
WBC: 8.7 10*3/uL (ref 4.0–10.5)

## 2014-03-25 ENCOUNTER — Telehealth: Payer: Self-pay | Admitting: Internal Medicine

## 2014-03-25 NOTE — Telephone Encounter (Signed)
Patient would like a call back in regards to what labs she went in for yesterday.

## 2014-03-29 NOTE — Telephone Encounter (Signed)
I called pt- I informed her of her recent CBC results. I also informed her to keep her appt with hematology to further evaluate elevated Hgb.

## 2014-04-01 ENCOUNTER — Other Ambulatory Visit: Payer: Self-pay | Admitting: *Deleted

## 2014-04-01 ENCOUNTER — Ambulatory Visit: Payer: Self-pay | Admitting: Internal Medicine

## 2014-04-01 MED ORDER — MELOXICAM 15 MG PO TABS: 15.0000 mg | ORAL_TABLET | Freq: Every day | ORAL | Status: DC | PRN

## 2014-04-04 ENCOUNTER — Ambulatory Visit (HOSPITAL_COMMUNITY): Payer: Self-pay | Admitting: Psychiatry

## 2014-04-04 ENCOUNTER — Ambulatory Visit: Payer: Self-pay | Admitting: Pain Medicine

## 2014-04-04 DIAGNOSIS — F17200 Nicotine dependence, unspecified, uncomplicated: Secondary | ICD-10-CM | POA: Diagnosis not present

## 2014-04-04 DIAGNOSIS — G8929 Other chronic pain: Secondary | ICD-10-CM | POA: Diagnosis not present

## 2014-04-04 DIAGNOSIS — F418 Other specified anxiety disorders: Secondary | ICD-10-CM | POA: Diagnosis not present

## 2014-04-04 DIAGNOSIS — M545 Low back pain: Secondary | ICD-10-CM | POA: Diagnosis not present

## 2014-04-04 DIAGNOSIS — I10 Essential (primary) hypertension: Secondary | ICD-10-CM | POA: Diagnosis not present

## 2014-04-04 DIAGNOSIS — R0782 Intercostal pain: Secondary | ICD-10-CM | POA: Diagnosis not present

## 2014-04-05 ENCOUNTER — Encounter: Payer: Self-pay | Admitting: Internal Medicine

## 2014-04-05 ENCOUNTER — Telehealth: Payer: Self-pay | Admitting: Cardiovascular Disease

## 2014-04-05 NOTE — Telephone Encounter (Signed)
I spoke with patient.  She complains of a muscle twitch on the right side of her neck that comes and goes.  She denies heart palps, chest pain, and SOB.  She does complain of dizziness, which she is being treated for by her PCP.  I advised her to continue her current medication and treatment plan and we will see her at her regular appt.  If the neck symptoms become worse, she is to call.  She verbalized understanding.

## 2014-04-05 NOTE — Telephone Encounter (Signed)
[name] is calling because she is having some fluttering in the right vein that goes thru her neck . Please call  Thanks

## 2014-04-06 ENCOUNTER — Other Ambulatory Visit: Payer: Self-pay | Admitting: *Deleted

## 2014-04-06 MED ORDER — PANTOPRAZOLE SODIUM 40 MG PO TBEC: 40.0000 mg | DELAYED_RELEASE_TABLET | Freq: Every day | ORAL | Status: DC

## 2014-04-07 ENCOUNTER — Ambulatory Visit (INDEPENDENT_AMBULATORY_CARE_PROVIDER_SITE_OTHER): Payer: Commercial Indemnity | Admitting: Psychiatry

## 2014-04-07 ENCOUNTER — Encounter (HOSPITAL_COMMUNITY): Payer: Self-pay | Admitting: Psychiatry

## 2014-04-07 DIAGNOSIS — F909 Attention-deficit hyperactivity disorder, unspecified type: Secondary | ICD-10-CM | POA: Diagnosis not present

## 2014-04-07 DIAGNOSIS — F319 Bipolar disorder, unspecified: Secondary | ICD-10-CM | POA: Diagnosis not present

## 2014-04-07 MED ORDER — DIAZEPAM 5 MG PO TABS: ORAL_TABLET | ORAL | Status: DC

## 2014-04-07 MED ORDER — ARIPIPRAZOLE 30 MG PO TABS: ORAL_TABLET | ORAL | Status: DC

## 2014-04-07 MED ORDER — LISDEXAMFETAMINE DIMESYLATE 30 MG PO CAPS: 30.0000 mg | ORAL_CAPSULE | Freq: Every day | ORAL | Status: DC

## 2014-04-07 MED ORDER — BENZTROPINE MESYLATE 1 MG PO TABS: 1.0000 mg | ORAL_TABLET | Freq: Every day | ORAL | Status: DC

## 2014-04-07 NOTE — Progress Notes (Signed)
[facility] [phone] Progress Note  [name] [id number] 44 y.o.  04/07/2014 1:21 PM  Chief Complaint:  I am doing better on Vyvanse.  Focalin is causing me more anxious and irritability.    History of Present Illness:  [name] came for her followup appointment.  She is taking Vyvanse, Abilify, Cogentin and Valium.  We had tried Focalin on her last visit but she call us back because she was feeling more irritable and anxious .  Recently she visited her primary care physician because of dizziness and fatigue.  She had blood work which shows high hemoglobin and she is recommended to see oncologist.  She endorse feeling very tired and exhausted and hoping that oncologist will help her.  Overall she is taking Abilify is helping her mood and irritability.  She is taking Valium 5 mg as needed and sometimes she is take second dose when she has doctor's appointment because she gets very anxious and nervous.  She denies any paranoia or any hallucination.  She endorsed her crying spells and feeling of hopelessness is much better with her current medication.  Patient is also taking Cymbalta and Pamelor for chronic pain and headaches from different provider.  Patient denies any tremors, shakes, palpitations or any EPS.  Her appetite is okay.  She denies any feeling of hopelessness or worthlessness.  She lives with her mother and her husband however usually her husband travels and mostly out of the town.  Patient denies drinking or using any illegal substances.  Suicidal Ideation: No Plan Formed: No Patient has means to carry out plan: No  Homicidal Ideation: No Plan Formed: No Patient has means to carry out plan: No  Past Psychiatric History/Hospitalization(s) Patient endorsed history of mood swings anger, irritability and ADD since childhood.  In the past she had tried Focalin, Vyvanse which worked very well until she lost her insurance.  She has seen [name] for the management of bipolar  disorder and ADD.  She did prescribe lithium which makes her careless, doxepin which helped her depression and quetiapine but patient did not see any improvement.  She also tried temazepam, Ambien which worked for only a short time.  She has given Valium with good response .  Patient denies any history of suicidal attempt but endorse suicidal ideation and stayed overnight in the psychiatric emergency room.  Patient denies any inpatient psychiatric treatment, hallucination, psychosis or mania.  However she endorse history of mood swing, irritability, Impulsive buying, spending money and panic attack .   Anxiety: Yes Bipolar Disorder: Yes  Depression: Yes Mania: Yes Psychosis: No Schizophrenia: No Personality Disorder: No Hospitalization for psychiatric illness: No History of Electroconvulsive Shock Therapy: No Prior Suicide Attempts: No  Medical History; Patient has multiple medical problems.  Her primary care physician is [name]. She has hypertension, asthma, GERD, eczema, cystitis, vitamin D deficiency, chronic pain, chronic fatigue, urinary incontinence , migraine headaches, hyperlipidemia and narcolepsy.  Recently she had surgery for appendicitis  Review of Systems  Constitutional: Positive for malaise/fatigue.  Skin: Negative for itching and rash.  Neurological: Positive for headaches.  Psychiatric/Behavioral: Negative for suicidal ideas, hallucinations and substance abuse. The patient is nervous/anxious.     Psychiatric: Agitation: No Hallucination: No Depressed Mood: No Insomnia: No Hypersomnia: No Altered Concentration: No Feels Worthless: No Grandiose Ideas: No Belief In Special Powers: No New/Increased Substance Abuse: No Compulsions: No  Neurologic: Headache: Yes Seizure: No Paresthesias: No  Outpatient Encounter Prescriptions as of 04/07/2014  Medication Sig  .  ARIPiprazole (ABILIFY) 30 MG tablet Take one tablet by mouth one time daily  . aspirin (BAYER  ASPIRIN) 325 MG tablet Take 1 tablet (325 mg total) by mouth daily.  . benztropine (COGENTIN) 1 MG tablet Take 1 tablet (1 mg total) by mouth at bedtime.  . butalbital-acetaminophen-caffeine (FIORICET) 50-325-40 MG per tablet Take 1 tablet by mouth every 6 (six) hours as needed for headache.  . diazepam (VALIUM) 5 MG tablet Take one tab daily and 2 nd if needed  . DULoxetine (CYMBALTA) 30 MG capsule Take 1 capsule (30 mg total) by mouth 2 (two) times daily.  . Linaclotide (LINZESS) 145 MCG CAPS capsule Take 1-2 capsules (145-290 mcg total) by mouth daily as needed.  [name] lisdexamfetamine (VYVANSE) 30 MG capsule Take 1 capsule (30 mg total) by mouth daily.  . meclizine (ANTIVERT) 12.5 MG tablet Take 1 tablet (12.5 mg total) by mouth 3 (three) times daily as needed for dizziness.  . meloxicam (MOBIC) 15 MG tablet Take 1 tablet (15 mg total) by mouth daily as needed for pain.  . metoprolol tartrate (LOPRESSOR) 25 MG tablet Take 1 tablet (25 mg total) by mouth 2 (two) times daily.  . nortriptyline (PAMELOR) 10 MG capsule 3 tabs by mouth at bedtime  . pantoprazole (PROTONIX) 40 MG tablet Take 1 tablet (40 mg total) by mouth daily.  . traMADol (ULTRAM) 50 MG tablet   . [DISCONTINUED] ARIPiprazole (ABILIFY) 30 MG tablet Take one tablet by mouth one time daily  . [DISCONTINUED] benztropine (COGENTIN) 1 MG tablet Take 1 tablet (1 mg total) by mouth at bedtime.  . [DISCONTINUED] diazepam (VALIUM) 5 MG tablet Take 1 tablet (5 mg total) by mouth 2 (two) times daily as needed for anxiety.  . [DISCONTINUED] lisdexamfetamine (VYVANSE) 30 MG capsule Take 1 capsule (30 mg total) by mouth daily.  . [DISCONTINUED] lisdexamfetamine (VYVANSE) 30 MG capsule Take 1 capsule (30 mg total) by mouth daily.    Recent Results (from the past 2160 hour(s))  Basic metabolic panel     Status: Abnormal   Collection Time: 03/16/14  8:14 AM  Result Value Ref Range   Sodium 136 135 - 145 mEq/L   Potassium 4.4 3.5 - 5.1 mEq/L    Chloride 97 96 - 112 mEq/L   CO2 25 19 - 32 mEq/L   Glucose, Bld 133 (H) 70 - 99 mg/dL   BUN 16 6 - 23 mg/dL   Creatinine, Ser 0.81 0.40 - 1.20 mg/dL   Calcium 10.4 8.4 - 10.5 mg/dL   GFR 82.69 >60.00 mL/min  CBC with Differential/Platelet     Status: Abnormal   Collection Time: 03/16/14  8:14 AM  Result Value Ref Range   WBC 7.0 4.0 - 10.5 K/uL   RBC 5.85 (H) 3.87 - 5.11 Mil/uL   Hemoglobin 18.3 (HH) 12.0 - 15.0 g/dL   HCT 52.4 (H) 36.0 - 46.0 %   MCV 89.5 78.0 - 100.0 fl   MCHC 34.8 30.0 - 36.0 g/dL   RDW 12.3 11.5 - 15.5 %   Platelets 253.0 150.0 - 400.0 K/uL   Neutrophils Relative % 57.4 43.0 - 77.0 %   Lymphocytes Relative 29.2 12.0 - 46.0 %   Monocytes Relative 10.6 3.0 - 12.0 %   Eosinophils Relative 2.2 0.0 - 5.0 %   Basophils Relative 0.6 0.0 - 3.0 %   Neutro Abs 4.0 1.4 - 7.7 K/uL   Lymphs Abs 2.1 0.7 - 4.0 K/uL   Monocytes Absolute 0.7 0.1 - 1.0  K/uL   Eosinophils Absolute 0.2 0.0 - 0.7 K/uL   Basophils Absolute 0.0 0.0 - 0.1 K/uL  Hepatic function panel     Status: Abnormal   Collection Time: 03/16/14  8:14 AM  Result Value Ref Range   Total Bilirubin 0.3 0.2 - 1.2 mg/dL   Bilirubin, Direct 0.0 0.0 - 0.3 mg/dL   Alkaline Phosphatase 141 (H) 39 - 117 U/L   AST 14 0 - 37 U/L   ALT 19 0 - 35 U/L   Total Protein 8.5 (H) 6.0 - 8.3 g/dL   Albumin 4.9 3.5 - 5.2 g/dL  CBC     Status: Abnormal   Collection Time: 03/24/14 12:31 PM  Result Value Ref Range   WBC 8.7 4.0 - 10.5 K/uL   RBC 5.18 (H) 3.87 - 5.11 Mil/uL   Platelets 241.0 150.0 - 400.0 K/uL   Hemoglobin 16.2 (H) 12.0 - 15.0 g/dL   HCT 46.2 (H) 36.0 - 46.0 %   MCV 89.1 78.0 - 100.0 fl   MCHC 35.1 30.0 - 36.0 g/dL   RDW 12.6 11.5 - 15.5 %    Physical Exam: Constitutional:  There were no vitals taken for this visit.  Musculoskeletal: Strength & Muscle Tone: within normal limits Gait & Station: normal Patient leans: N/A  Mental Status Examination;   Patient is casually dressed and fairly groomed.   She is pleasant, cooperative and maintained good eye contact. Her speech is fast but clear and coherent  Her attention and concentration is fair.  Her thought processes is logical.  She described her mood tired.  Her affect is mood appropriate.  She denies any auditory or visual hallucination.  She denies any active or passive suicidal thoughts or homicidal thoughts.  Her fund of knowledge is adequate.  There were no tremors or shakes.  There were no delusions, paranoia or any obsession.  There were no flight of ideas or any loose association present at this time.  She is alert and oriented x3.  Her insight judgment and impulse control is okay.     Assessment: Axis I:  Bipolar disorder NOS, attention deficit disorder by history   Axis II:  Deferred   Axis III:  Past Medical History  Diagnosis Date  . Attention deficit disorder without mention of hyperactivity   . Anxiety state, unspecified   . Pain in joint, site unspecified   . Unspecified asthma(493.90)   . Migraine without aura, without mention of intractable migraine without mention of status migrainosus   . Depressive disorder, not elsewhere classified   . Dysuria   . Family history of diabetes mellitus   . Family history of asthma   . Family history of ischemic heart disease     fam h/o premature cardiac disease   . Female stress incontinence   . Esophageal reflux   . Unspecified essential hypertension   . Persistent disorder of initiating or maintaining sleep   . Insomnia, unspecified   . Pain in limb   . Tobacco use disorder   . Unspecified vitamin D deficiency   . Abnormal weight gain   . Dyslipidemia     hypertriglyceridemia  . Hypertension   . Chronic constipation 04/01/2013   Plan:  I review her records from primary care physician including recent blood work.  She has high hemoglobin and hematocrit.  She scheduled to see oncologist.  Encouraged to keep appointment with oncologist.  Discussed medication side effects and  benefits.  I will discontinue Focalin since  patient is no longer taking it.  Continue Vyvanse 20 mg daily , Cogentin 1 mg daily, Abilify 30 mg daily and Valium 5 mg 1-2 tablet as needed.  She is getting nortriptyline and Cymbalta from her primary care physician.  Discussed medication side effects, and direction and benefits in detail.  Recommended to call us back if she has any question or any concern. Time spent 25 minutes.  More than 50% of the time spent in psychoeducation, counseling and coordination of care.  Discuss safety plan that anytime having active suicidal thoughts or homicidal thoughts then patient need to call 911 or go to the local emergency room. Follow-up in 2 months.   Dot Splinter T., MD 04/07/2014

## 2014-04-14 ENCOUNTER — Ambulatory Visit: Payer: Medicare Other

## 2014-04-14 ENCOUNTER — Ambulatory Visit (HOSPITAL_BASED_OUTPATIENT_CLINIC_OR_DEPARTMENT_OTHER): Payer: Medicare Other | Admitting: Oncology

## 2014-04-14 ENCOUNTER — Ambulatory Visit (INDEPENDENT_AMBULATORY_CARE_PROVIDER_SITE_OTHER): Payer: BLUE CROSS/BLUE SHIELD | Admitting: Cardiovascular Disease

## 2014-04-14 ENCOUNTER — Encounter: Payer: Self-pay | Admitting: Oncology

## 2014-04-14 ENCOUNTER — Encounter: Payer: Self-pay | Admitting: Cardiovascular Disease

## 2014-04-14 ENCOUNTER — Telehealth: Payer: Self-pay | Admitting: Oncology

## 2014-04-14 ENCOUNTER — Other Ambulatory Visit: Payer: Self-pay

## 2014-04-14 VITALS — BP 136/96 | HR 16 | Ht 66.0 in | Wt 196.3 lb

## 2014-04-14 VITALS — BP 125/80 | HR 88 | Temp 98.2°F | Resp 18 | Ht 66.0 in | Wt 198.1 lb

## 2014-04-14 DIAGNOSIS — I10 Essential (primary) hypertension: Secondary | ICD-10-CM

## 2014-04-14 DIAGNOSIS — D751 Secondary polycythemia: Secondary | ICD-10-CM | POA: Diagnosis not present

## 2014-04-14 MED ORDER — METOPROLOL TARTRATE 50 MG PO TABS: 50.0000 mg | ORAL_TABLET | Freq: Two times a day (BID) | ORAL | Status: DC

## 2014-04-14 NOTE — Progress Notes (Signed)
Checked in new pt with no financial concerns. °

## 2014-04-14 NOTE — Patient Instructions (Signed)
Your physician has recommended you make the following change in your medication: INCREASE METOPROLOL TO 50 MG TWICE DAILY  Your physician recommends that you schedule a follow-up appointment in: 6 months with Dr.Croitoru

## 2014-04-14 NOTE — Consult Note (Signed)
Reason for Referral: Polycythemia.   HPI: 44 year old woman with history of hypertension, depression and arthritis referred to me for the evaluation of elevated hemoglobin. She reported episodic dizziness since January 2015 and subsequently was developed appendicitis and required an operation in April 2015. Since that time she continues to have dizzy spells and most recently she was noted to have an elevated hemoglobin. Her hemoglobin on 03/16/2014 was 18.3, white cell count of 7.0 and platelet count of 253. She was started on low-dose aspirin by her primary care provider and a repeat CBC on 03/24/2014 showed a hemoglobin of 16.2. Looking back at the previous testing her hemoglobin was 16.8 in September 2015 and was 16.1 in August 2013. She did have a sleep study last year which did not show any evidence of sleep apnea. She continues to smoke about 1 pack per day. He does have chronic pain syndrome and follows up with pain management clinic. Clinically, she is relatively asymptomatic other than episodic dizziness. The dizziness comes intermittently and feels like a lightheadedness but feeling rather than vertigo. Is not positional in nature. She also reported some mild fatigue. She did not report any hormonal replacement therapy. She did not report any exposure to toxic fumes. She denied any travels to higher elevation. She does not report any fevers, chills, sweats or weight loss or appetite changes. She does not report any chest pain, palpitation orthopnea or PND. She does not report any cough or hemoptysis or hematemesis. She does not report any nausea, vomiting, but does report nagging lower pelvic pain. She does not report any lymphadenopathy or petechiae. She does report chronic joint pain which is unchanged. Rest of her review of systems unremarkable.   Past Medical History  Diagnosis Date  . Attention deficit disorder without mention of hyperactivity   . Anxiety state, unspecified   . Pain in  joint, site unspecified   . Unspecified asthma(493.90)   . Migraine without aura, without mention of intractable migraine without mention of status migrainosus   . Depressive disorder, not elsewhere classified   . Dysuria   . Family history of diabetes mellitus   . Family history of asthma   . Family history of ischemic heart disease     fam h/o premature cardiac disease   . Female stress incontinence   . Esophageal reflux   . Unspecified essential hypertension   . Persistent disorder of initiating or maintaining sleep   . Insomnia, unspecified   . Pain in limb   . Tobacco use disorder   . Unspecified vitamin D deficiency   . Abnormal weight gain   . Dyslipidemia     hypertriglyceridemia  . Hypertension   . Chronic constipation 04/01/2013  :  Past Surgical History  Procedure Laterality Date  . Partial hysterectomy  2007  . Tonsillectomy  04/29/00  . Incise and drain abcess  2011    Right axilla- Dr. Barkley Bruns  . Abdominal ultrasound  10/12/97  . Plantar fascia surgery  2000  . Transthoracic echocardiogram  11/2009    EF=>55%; trace MR & TR  . Nm myocar perf wall motion  11/2009    bruce myoview; perfusion defect in anterior region consistent with breast attenuation; remaining myocardium with normal perfusion; post-stress EF 74%; low risk scan   . Appendectomy  05/27/2013    gangrenous  :   Current outpatient prescriptions:  .  ARIPiprazole (ABILIFY) 30 MG tablet, Take one tablet by mouth one time daily, Disp: 30 tablet, Rfl: 1 .  aspirin (BAYER ASPIRIN) 325 MG tablet, Take 1 tablet (325 mg total) by mouth daily., Disp: 100 tablet, Rfl: 3 .  benztropine (COGENTIN) 1 MG tablet, Take 1 tablet (1 mg total) by mouth at bedtime., Disp: 30 tablet, Rfl: 1 .  butalbital-acetaminophen-caffeine (FIORICET) 50-325-40 MG per tablet, Take 1 tablet by mouth every 6 (six) hours as needed for headache., Disp: 20 tablet, Rfl: 0 .  diazepam (VALIUM) 5 MG tablet, Take one tab daily and 2 nd if  needed, Disp: 45 tablet, Rfl: 1 .  DULoxetine (CYMBALTA) 30 MG capsule, Take 1 capsule (30 mg total) by mouth 2 (two) times daily., Disp: 60 capsule, Rfl: 5 .  Linaclotide (LINZESS) 145 MCG CAPS capsule, Take 1-2 capsules (145-290 mcg total) by mouth daily as needed., Disp: 60 capsule, Rfl: 5 .  lisdexamfetamine (VYVANSE) 30 MG capsule, Take 1 capsule (30 mg total) by mouth daily., Disp: 30 capsule, Rfl: 0 .  meloxicam (MOBIC) 15 MG tablet, Take 1 tablet (15 mg total) by mouth daily as needed for pain., Disp: 30 tablet, Rfl: 5 .  metoprolol (LOPRESSOR) 50 MG tablet, Take 50 mg by mouth daily., Disp: , Rfl:  .  nortriptyline (PAMELOR) 10 MG capsule, 3 tabs by mouth at bedtime, Disp: 90 capsule, Rfl: 5 .  pantoprazole (PROTONIX) 40 MG tablet, Take 1 tablet (40 mg total) by mouth daily., Disp: 90 tablet, Rfl: 3 .  traMADol (ULTRAM) 50 MG tablet, , Disp: , Rfl:  .  meclizine (ANTIVERT) 12.5 MG tablet, Take 1 tablet (12.5 mg total) by mouth 3 (three) times daily as needed for dizziness., Disp: 30 tablet, Rfl: 0:  Allergies  Allergen Reactions  . Amoxicillin     REACTION: QUESTIONABLE  . Doxycycline     REACTION: vomiting  . Hydrochlorothiazide     REACTION: CRAMPS AT HIGHER DOSAGES  . Lisinopril     REACTION: COUGH  . Penicillins   . Percocet [Oxycodone-Acetaminophen]     itching  . Talwin [Pentazocine]     n/v  :  Family History  Problem Relation Age of Onset  . Asthma      Family history  . Coronary artery disease      1st degree female < 50  . Diabetes      1st degree relative  . Breast cancer Mother   . Multiple sclerosis Mother   . Cancer Mother 17    breast ca  . Depression Mother   . Lupus Sister     PTSD  . Alcohol abuse Sister   . Bipolar disorder Sister   . Alcohol abuse Brother     also HTN, lupus, enlarged heart  . Heart disease Father     cardiac arrest  . Heart disease Maternal Grandfather     cardiac arrest  . COPD Paternal Grandmother   . Diabetes Paternal  Grandfather   . Heart disease Paternal Grandfather   . ADD / ADHD Child   . Asthma Child   :  History   Social History  . Marital Status: Married    Spouse Name: N/A  . Number of Children: 2  . Years of Education: N/A   Occupational History  . 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 Smoking status: Current Every Day Smoker -- 1.00 packs/day    Types: Cigarettes  . Smokeless tobacco: Never Used  . Alcohol Use: No  . Drug  Use: No  . Sexual Activity: Yes   Other Topics Concern  . Not on file   Social History Narrative   Patient lives at home with her mother.   Disabled   Right handed   Caffeine three cups daily   Some college education  :  Pertinent items are noted in HPI.  Exam: ECOG 0 Blood pressure 125/80, pulse 88, temperature 98.2 F (36.8 C), temperature source Oral, resp. rate 18, height 5\' 6"  (1.676 m), weight 198 lb 1.6 oz (89.858 kg), SpO2 98 %. General appearance: alert and cooperative Head: Normocephalic, without obvious abnormality Throat: lips, mucosa, and tongue normal; teeth and gums normal Neck: no adenopathy Back: negative Resp: clear to auscultation bilaterally Cardio: regular rate and rhythm, S1, S2 normal, no murmur, click, rub or gallop GI: soft, non-tender; bowel sounds normal; no masses,  no organomegaly Extremities: extremities normal, atraumatic, no cyanosis or edema Pulses: 2+ and symmetric Skin: Skin color, texture, turgor normal. No rashes or lesions Lymph nodes: Cervical, supraclavicular, and axillary nodes normal.  CBC    Component Value Date/Time   WBC 8.7 03/24/2014 1231   RBC 5.18* 03/24/2014 1231   HGB 16.2* 03/24/2014 1231   HCT 46.2* 03/24/2014 1231   PLT 241.0 03/24/2014 1231   MCV 89.1 03/24/2014 1231   MCH 32.1 09/20/2011 1424   MCHC 35.1 03/24/2014 1231   RDW 12.6 03/24/2014 1231   LYMPHSABS 2.1 03/16/2014 0814   MONOABS 0.7 03/16/2014  0814   EOSABS 0.2 03/16/2014 0814   BASOSABS 0.0 03/16/2014 0814      Chemistry      Component Value Date/Time   NA 136 03/16/2014 0814   K 4.4 03/16/2014 0814   CL 97 03/16/2014 0814   CO2 25 03/16/2014 0814   BUN 16 03/16/2014 0814   CREATININE 0.81 03/16/2014 0814   CREATININE 0.65 04/28/2010 1346      Component Value Date/Time   CALCIUM 10.4 03/16/2014 0814   ALKPHOS 141* 03/16/2014 0814   AST 14 03/16/2014 0814   ALT 19 03/16/2014 0814   BILITOT 0.3 03/16/2014 0814       Assessment and Plan:   44 year old woman with the following issues:  1. Polycythemia: Her hemoglobin actually improved from 18.3 to 16.2. She has normal white cell count and normal platelet. This finding dates back to at least 2013. Differential diagnosis discussed today. Secondary causes are the most likely to be considered. These would include smoking, medication, dehydration, exposure to toxic fumes oh, monoxide. She could have an element of sleep apnea although sleeping study did not confirm that. It is unlikely that she has primary myeloproliferative disorder such as polycythemia vera.  For management standpoint, I agree with aspirin at this time. I see no need for any therapeutic phlebotomy given the mild elevation in her hemoglobin. I have recommended smoking cessation as well as observation of her hemoglobin. I plan on rechecking her blood count in 4 months and checking a JAK2 mutation to rule out polycythemia vera.  2. Thrombosis prophylaxis: Her risk of thrombosis is very low at this time and I agree with aspirin at this time.  3. Follow-up: Will be in 4 months

## 2014-04-14 NOTE — Progress Notes (Signed)
Please see consult note.  

## 2014-04-14 NOTE — Telephone Encounter (Signed)
Pt confirmed labs/ov per 03/17 POF, gave pt AVS and calendar..... KJ  °

## 2014-04-15 NOTE — Progress Notes (Signed)
Patient ID: [name], female   DOB: [birth year], 44 y.o.   MRN: [id number]     Cardiology Office Note   Date:  04/15/2014   ID:  [name], DOB 11/16/1970, MRN [id number]  PCP:  Walker Kehr, MD  Cardiologist:   Sanda Klein, MD   Chief Complaint  Patient presents with  . Follow-up    Yearly:  fluttering feeling in the right side of neck.  No chest pain, SOB, edema.  Dizziness off and on over the past year.  Saw PCP one month ago hbg high scheduled to see oncologist      History of Present Illness: [name] is a 44 y.o. female who presents for palpitations. She is worried due to a recent diagnosis of possible polycythemia vera. Her hemoglobin was as high as 18. After phlebotomy it is down to 16. She is due to see an oncologist later today. She describes her palpitations as a fluttering sensation in the side of her neck. As before she has had occasional episodes of dizziness off and on. She denies chest pain, dyspnea or lower extremity edema.  Last year she had an echocardiogram that showed normal findings. On beta blocker therapy her diastolic blood pressure remains usually elevated. Her heart rate is often elevated as well. Workup for pheochromocytoma with laboratory testing was negative.    Past Medical History  Diagnosis Date  . Attention deficit disorder without mention of hyperactivity   . Anxiety state, unspecified   . Pain in joint, site unspecified   . Unspecified asthma(493.90)   . Migraine without aura, without mention of intractable migraine without mention of status migrainosus   . Depressive disorder, not elsewhere classified   . Dysuria   . Family history of diabetes mellitus   . Family history of asthma   . Family history of ischemic heart disease     fam h/o premature cardiac disease   . Female stress incontinence   . Esophageal reflux   . Unspecified essential hypertension   . Persistent disorder of initiating or maintaining sleep   .  Insomnia, unspecified   . Pain in limb   . Tobacco use disorder   . Unspecified vitamin D deficiency   . Abnormal weight gain   . Dyslipidemia     hypertriglyceridemia  . Hypertension   . Chronic constipation 04/01/2013    Past Surgical History  Procedure Laterality Date  . Partial hysterectomy  2007  . Tonsillectomy  04/29/00  . Incise and drain abcess  2011    Right axilla- Dr. Barkley Bruns  . Abdominal ultrasound  10/12/97  . Plantar fascia surgery  2000  . Transthoracic echocardiogram  11/2009    EF=>55%; trace MR & TR  . Nm myocar perf wall motion  11/2009    bruce myoview; perfusion defect in anterior region consistent with breast attenuation; remaining myocardium with normal perfusion; post-stress EF 74%; low risk scan   . Appendectomy  05/27/2013    gangrenous     Current Outpatient Prescriptions  Medication Sig Dispense Refill  . ARIPiprazole (ABILIFY) 30 MG tablet Take one tablet by mouth one time daily 30 tablet 1  . aspirin (BAYER ASPIRIN) 325 MG tablet Take 1 tablet (325 mg total) by mouth daily. 100 tablet 3  . benztropine (COGENTIN) 1 MG tablet Take 1 tablet (1 mg total) by mouth at bedtime. 30 tablet 1  . butalbital-acetaminophen-caffeine (FIORICET) 50-325-40 MG per tablet Take 1 tablet by mouth every 6 (six) hours as  needed for headache. 20 tablet 0  . diazepam (VALIUM) 5 MG tablet Take one tab daily and 2 nd if needed 45 tablet 1  . DULoxetine (CYMBALTA) 30 MG capsule Take 1 capsule (30 mg total) by mouth 2 (two) times daily. 60 capsule 5  . Linaclotide (LINZESS) 145 MCG CAPS capsule Take 1-2 capsules (145-290 mcg total) by mouth daily as needed. 60 capsule 5  . lisdexamfetamine (VYVANSE) 30 MG capsule Take 1 capsule (30 mg total) by mouth daily. 30 capsule 0  . meclizine (ANTIVERT) 12.5 MG tablet Take 1 tablet (12.5 mg total) by mouth 3 (three) times daily as needed for dizziness. 30 tablet 0  . meloxicam (MOBIC) 15 MG tablet Take 1 tablet (15 mg total) by mouth  daily as needed for pain. 30 tablet 5  . nortriptyline (PAMELOR) 10 MG capsule 3 tabs by mouth at bedtime 90 capsule 5  . pantoprazole (PROTONIX) 40 MG tablet Take 1 tablet (40 mg total) by mouth daily. 90 tablet 3  . traMADol (ULTRAM) 50 MG tablet     . metoprolol (LOPRESSOR) 50 MG tablet Take 50 mg by mouth daily.     No current facility-administered medications for this visit.    Allergies:   Amoxicillin; Doxycycline; Hydrochlorothiazide; Lisinopril; Penicillins; Percocet; and Talwin    Social History:  The patient  reports that she has been smoking Cigarettes.  She has been smoking about 1.00 pack per day. She has never used smokeless tobacco. She reports that she does not drink alcohol or use illicit drugs.   Family History:  The patient's family history includes ADD / ADHD in her child; Alcohol abuse in her brother and sister; Asthma in her child and another family member; Bipolar disorder in her sister; Breast cancer in her mother; COPD in her paternal grandmother; Cancer (age of onset: 84) in her mother; Coronary artery disease in an other family member; Depression in her mother; Diabetes in her paternal grandfather and another family member; Heart disease in her father, maternal grandfather, and paternal grandfather; Lupus in her sister; Multiple sclerosis in her mother.    ROS:  Please see the history of present illness.    Otherwise, review of systems positive for dizziness, mood disorder All other systems are reviewed and negative.    PHYSICAL EXAM: VS:  BP 136/96 mmHg  Pulse 16  Ht 5\' 6"  (1.676 m)  Wt 196 lb 4.8 oz (89.041 kg)  BMI 31.70 kg/m2 , BMI Body mass index is 31.7 kg/(m^2).  General: Alert, oriented x3, no distress Head: no evidence of trauma, PERRL, EOMI, no exophtalmos or lid lag, no myxedema, no xanthelasma; normal ears, nose and oropharynx Neck: normal jugular venous pulsations and no hepatojugular reflux; brisk carotid pulses without delay and no carotid  bruits Chest: clear to auscultation, no signs of consolidation by percussion or palpation, normal fremitus, symmetrical and full respiratory excursions Cardiovascular: normal position and quality of the apical impulse, regular rhythm, normal first and second heart sounds, no murmurs, rubs or gallops Abdomen: no tenderness or distention, no masses by palpation, no abnormal pulsatility or arterial bruits, normal bowel sounds, no hepatosplenomegaly Extremities: no clubbing, cyanosis or edema; 2+ radial, ulnar and brachial pulses bilaterally; 2+ right femoral, posterior tibial and dorsalis pedis pulses; 2+ left femoral, posterior tibial and dorsalis pedis pulses; no subclavian or femoral bruits Neurological: grossly nonfocal Psych: euthymic mood, full affect   EKG:  EKG is ordered today. The ekg ordered today demonstrates mild sinus tachycardia, questionable left atrial  abnormality, otherwise normal   Recent Labs: 10/01/2013: TSH 1.83 03/16/2014: ALT 19; BUN 16; Creatinine 0.81; Potassium 4.4; Sodium 136 03/24/2014: Hemoglobin 16.2*; Platelets 241.0    Lipid Panel    Component Value Date/Time   CHOL 225* 10/01/2013 0855   TRIG 98.0 10/01/2013 0855   HDL 41.10 10/01/2013 0855   CHOLHDL 5 10/01/2013 0855   VLDL 19.6 10/01/2013 0855   LDLCALC 164* 10/01/2013 0855   LDLDIRECT 161.1 02/18/2013 0817      Wt Readings from Last 3 Encounters:  04/14/14 198 lb 1.6 oz (89.858 kg)  04/14/14 196 lb 4.8 oz (89.041 kg)  03/16/14 191 lb (86.637 kg)     ASSESSMENT AND PLAN:  Persistent diastolic hypertension. Recommend she increase the metoprolol dose to 25 mg twice a day. Consider change to carvedilol, adding amlodipine or a thiazide diuretic if her blood pressure remains high. Discussed the the deleterious effects of nonsteroidal anti-inflammatory drugs such as meloxicam on her blood pressure. I don't think her amphetamine therapy is a big contributor to her hypertension, although may be playing a  role in her tachycardia, as may the Cogentin.  Both Larene Beach and her husband describe a true interest in smoking cessation. At this point they're little worried about the upcoming evaluation from hematology/oncology.  Ahnna has moderate hypercholesterolemia and meets criteria for statin therapy even his primary prevention. She would like to try diet and exercise before starting medications.  Current medicines are reviewed at length with the patient today.  The patient does not have concerns regarding medicines.  The following changes have been made:  Increase metoprolol to 50 mg twice a day  Labs/ tests ordered today include:  Orders Placed This Encounter  Procedures  . EKG 12-Lead   Patient Instructions  Your physician has recommended you make the following change in your medication: INCREASE METOPROLOL TO 50 MG TWICE DAILY  Your physician recommends that you schedule a follow-up appointment in: 6 months with Dr.Dock Baccam   Signed, Sanda Klein, MD  04/15/2014 6:36 PM    Sanda Klein, MD, [facility] [phone] office [phone] pager

## 2014-04-19 ENCOUNTER — Ambulatory Visit: Payer: Self-pay | Admitting: Pain Medicine

## 2014-04-19 DIAGNOSIS — M5124 Other intervertebral disc displacement, thoracic region: Secondary | ICD-10-CM | POA: Diagnosis not present

## 2014-04-19 DIAGNOSIS — M47894 Other spondylosis, thoracic region: Secondary | ICD-10-CM | POA: Diagnosis not present

## 2014-04-19 DIAGNOSIS — M4806 Spinal stenosis, lumbar region: Secondary | ICD-10-CM | POA: Diagnosis not present

## 2014-04-19 DIAGNOSIS — M5416 Radiculopathy, lumbar region: Secondary | ICD-10-CM | POA: Diagnosis not present

## 2014-04-19 DIAGNOSIS — M47896 Other spondylosis, lumbar region: Secondary | ICD-10-CM | POA: Diagnosis not present

## 2014-04-19 DIAGNOSIS — R0782 Intercostal pain: Secondary | ICD-10-CM | POA: Diagnosis not present

## 2014-04-19 DIAGNOSIS — M47817 Spondylosis without myelopathy or radiculopathy, lumbosacral region: Secondary | ICD-10-CM | POA: Diagnosis not present

## 2014-04-19 DIAGNOSIS — M170 Bilateral primary osteoarthritis of knee: Secondary | ICD-10-CM | POA: Diagnosis not present

## 2014-04-25 ENCOUNTER — Encounter: Payer: Self-pay | Admitting: Internal Medicine

## 2014-04-27 ENCOUNTER — Other Ambulatory Visit: Payer: Self-pay | Admitting: Internal Medicine

## 2014-04-27 MED ORDER — PREGABALIN 50 MG PO CAPS: 50.0000 mg | ORAL_CAPSULE | Freq: Three times a day (TID) | ORAL | Status: DC

## 2014-05-02 ENCOUNTER — Ambulatory Visit
Admit: 2014-05-02 | Disposition: A | Discharge status: Home / Self Care | Payer: Self-pay | Attending: Pain Medicine | Admitting: Pain Medicine

## 2014-05-02 DIAGNOSIS — M5124 Other intervertebral disc displacement, thoracic region: Secondary | ICD-10-CM | POA: Diagnosis not present

## 2014-05-02 DIAGNOSIS — M47894 Other spondylosis, thoracic region: Secondary | ICD-10-CM | POA: Diagnosis not present

## 2014-05-02 DIAGNOSIS — M5412 Radiculopathy, cervical region: Secondary | ICD-10-CM | POA: Diagnosis not present

## 2014-05-02 DIAGNOSIS — M47896 Other spondylosis, lumbar region: Secondary | ICD-10-CM | POA: Diagnosis not present

## 2014-05-02 DIAGNOSIS — M4806 Spinal stenosis, lumbar region: Secondary | ICD-10-CM | POA: Diagnosis not present

## 2014-05-11 ENCOUNTER — Ambulatory Visit: Payer: Self-pay | Admitting: Internal Medicine

## 2014-05-19 ENCOUNTER — Ambulatory Visit
Admit: 2014-05-19 | Disposition: A | Discharge status: Home / Self Care | Payer: Self-pay | Attending: Pain Medicine | Admitting: Pain Medicine

## 2014-05-19 DIAGNOSIS — M47896 Other spondylosis, lumbar region: Secondary | ICD-10-CM | POA: Diagnosis not present

## 2014-05-19 DIAGNOSIS — M47894 Other spondylosis, thoracic region: Secondary | ICD-10-CM | POA: Diagnosis not present

## 2014-05-19 DIAGNOSIS — M5124 Other intervertebral disc displacement, thoracic region: Secondary | ICD-10-CM | POA: Diagnosis not present

## 2014-05-21 NOTE — H&P (Signed)
   Subjective/Chief Complaint RLQ pain x 2 days, nausea/vomiting, constipation   History of Present Illness [name] is a pleasant 44 yo F with a history of chronic back pain on tramadol who presents with 1 day of worsening RLQ pain, nausea/vomiting and subjective fevers.  Says that she has had constipation and nausea/vomiting for the past 2 months and was planning on seeing a gastroenterologist tomorrow.  She developed severe periumbilical and suprapubic pain yesterday and this progressed to RLQ pain.  Also with worsening N/V.  Has never had this pain before.  No sick contacts or unusual ingestions.  WBC 20, CT shows dilated thickened appendix with periappendiceal stranding.  + subjective fevers.   Past History Chronic back pain  Tachycardia HTN H/o left foot surgery H/o tonsillectomy H/o hysterectomy   Past Medical Health Hypertension   Past Med/Surgical Hx:  chronic back pain:   anxiety/depression:   Tachycardia:   HTN:   Left foot surgery:   Tonsillectomy:   Hysterectomy:   ALLERGIES:  Amoxicillin: Itching  Doxycycline: N/V/Diarrhea  Percocet 10/325: Itching  Talwin: N/V/Diarrhea  Lisinopril: Cough  Family and Social History:  Family History Coronary Artery Disease  Hypertension  Cancer   Social History positive  tobacco, negative ETOH, negative Illicit drugs, 1 ppd   + Tobacco Current (within 1 year)   Place of Living Home   Review of Systems:  Subjective/Chief Complaint RLQ pain, nausea/vomiting   Fever/Chills Yes   Cough No   Sputum No   Abdominal Pain Yes   Diarrhea No   Constipation Yes   Nausea/Vomiting Yes   SOB/DOE No   Chest Pain No   Dysuria No   Tolerating Diet No  Nauseated  Vomiting   Physical Exam:  GEN well developed, well nourished, no acute distress   HEENT pink conjunctivae, PERRL   RESP normal resp effort  clear BS  no use of accessory muscles   CARD regular rate  no murmur  no thrills   ABD positive tenderness  denies  Flank Tenderness  no hernia  soft  normal BS  RLQ tenderness   EXTR negative cyanosis/clubbing, negative edema   SKIN normal to palpation, No rashes, No ulcers   NEURO cranial nerves intact, negative Babinski R/L, negative Babinski R, negative Babinski L, negative rigidity, negative tremor, follows commands, strength:   PSYCH A+O to time, place, person, good insight    Assessment/Admission Diagnosis [name] is a pleasant 44 yo F with a history of chronic back pain who presents with 2 days of severe RLQ pain.  Radiographic, clinical and laboratory appendicitis.   Plan To OR for laparoscopic appendectomy   Electronic Signatures: Dawood Spitler, [name] (MD)  (Signed 30-Apr-15 14:05)  Authored: CHIEF COMPLAINT and HISTORY, PAST MEDICAL/SURGIAL HISTORY, ALLERGIES, FAMILY AND SOCIAL HISTORY, REVIEW OF SYSTEMS, PHYSICAL EXAM, ASSESSMENT AND PLAN   Last Updated: 30-Apr-15 14:05 by Floyde Parkins (MD)

## 2014-05-21 NOTE — Op Note (Signed)
PATIENT NAME:  CAILEY, TRIGUEROS MR#:  [id number] DATE OF BIRTH:  [birth year]  DATE OF PROCEDURE:  05/27/2013  PREOPERATIVE DIAGNOSIS: Acute appendicitis.   POSTOPERATIVE DIAGNOSIS: Acute appendicitis, ruptured and gangrenous.   PROCEDURE PERFORMED: Laparoscopic appendectomy.   SURGEON: Marlyce Huge, MD  ASSISTANT: Storm PA student   SPECIMEN: Appendix.   ESTIMATED BLOOD LOSS: 25 mL.   COMPLICATIONS: None.   ANESTHESIA: General endotracheal.   INDICATION FOR SURGERY: Ms. Schnieders is a pleasant 44 year old female who presents with 2 days of right lower quadrant pain, leukocytosis, and CT findings consistent with acute appendicitis. She was brought to the operating room for laparoscopic appendectomy.   DETAILS OF PROCEDURE: Informed consent was obtained. Ms. Brandi was brought to the operating room suite. She was induced. Endotracheal tube was placed. General anesthesia was administered. Her abdomen was prepped and draped in standard surgical fashion. A timeout was then performed correctly identifying the patient name, operative site, and procedure to be performed. A supraumbilical incision was made. It was deepened down to the fascia. The fascia was incised. The peritoneum was entered. Two stay sutures were placed through the fasciotomy. The abdomen was insufflated. A camera was placed through the [facility] trocar. A left lower quadrant 5 mm and a suprapubic 5 mm trocar were placed. The appendix was retrocecal; therefore, the cecum had to be partially mobilized, and the appendix was grasped. It was noted to be large and inflammatory hidden in a complex inflammatory mass. The appendix was carefully dissected away from the surrounding tissue. A defect was made at the base of the mesoappendix, at the base of the appendix. The stapler was fired across the base of the appendix. Stapler was then fired across the mesoappendix. The appendix was taken out through an Endo Catch bag, through the  supraumbilical port. The abdomen was then irrigated with large amounts of normal saline. The abdomen was then desufflated after the staple lines were examined and noted to be hemostatic. All trocars were removed. The supraumbilical incision was closed with previously placed stay sutures. All port sites were injected with 1% lidocaine with epinephrine and closed with interrupted 4-0 Monocryl. A drain was placed through the suprapubic site due to the fact that the appendix was frankly necrotic and perforated to prevent postoperative abscess or to control any postoperative staple line leak. The drains was then sutured in place with a 3-0 nylon. Drapes were then taken down. The patient was awoken, extubated and brought to the postanesthesia care unit. There were no immediate complications. Needle, sponge, and instrument counts were correct at the end of the procedure.   ____________________________ Glena Norfolk. Kolina Kube, MD cal:sb D: 05/28/2013 10:07:13 ET T: 05/28/2013 10:32:17 ET JOB#: [id number]  cc: Harrell Gave A. Temitope Griffing, MD, <Dictator> Floyde Parkins MD ELECTRONICALLY SIGNED 05/29/2013 8:38

## 2014-05-30 ENCOUNTER — Ambulatory Visit: Payer: BLUE CROSS/BLUE SHIELD | Attending: Pain Medicine | Admitting: Pain Medicine

## 2014-05-30 ENCOUNTER — Encounter: Payer: Self-pay | Admitting: Pain Medicine

## 2014-05-30 VITALS — BP 96/52 | HR 75 | Temp 98.2°F | Resp 16 | Ht 66.0 in | Wt 199.0 lb

## 2014-05-30 DIAGNOSIS — G588 Other specified mononeuropathies: Secondary | ICD-10-CM

## 2014-05-30 DIAGNOSIS — M79604 Pain in right leg: Secondary | ICD-10-CM

## 2014-05-30 DIAGNOSIS — M79605 Pain in left leg: Secondary | ICD-10-CM | POA: Insufficient documentation

## 2014-05-30 MED ORDER — BUPIVACAINE HCL (PF) 0.25 % IJ SOLN
10.0000 mL | Freq: Once | INTRAMUSCULAR | Status: AC
  Administered 2014-05-30: 10 mL

## 2014-05-30 MED ORDER — TRIAMCINOLONE ACETONIDE 40 MG/ML IJ SUSP
40.0000 mg | Freq: Once | INTRAMUSCULAR | Status: AC
  Administered 2014-05-30: 40 mg via INTRAMUSCULAR

## 2014-05-30 MED ORDER — MIDAZOLAM HCL 2 MG/2ML IJ SOLN: 5.0000 mg | INTRAMUSCULAR | Status: DC

## 2014-05-30 MED ORDER — FENTANYL CITRATE (PF) 100 MCG/2ML IJ SOLN: 100.0000 ug | INTRAMUSCULAR | Status: DC

## 2014-05-30 MED ORDER — ORPHENADRINE CITRATE 30 MG/ML IJ SOLN: 30.0000 mg | Freq: Once | INTRAMUSCULAR | Status: DC

## 2014-05-30 NOTE — Patient Instructions (Addendum)
Selective Nerve Root Block Patient Information  Description: Specific nerve roots exit the spinal canal and these nerves can be compressed and inflamed by a bulging disc and bone spurs.  By injecting steroids on the nerve root, we can potentially decrease the inflammation surrounding these nerves, which often leads to decreased pain.  Also, by injecting local anesthesia on the nerve root, this can provide [place] helpful information to give to your referring doctor if it decreases your pain.  Selective nerve root blocks can be done along the spine from the neck to the low back depending on the location of your pain.   After numbing the skin with local anesthesia, a small needle is passed to the nerve root and the position of the needle is verified using x-ray pictures.  After the needle is in correct position, we then deposit the medication.  You may experience a pressure sensation while this is being done.  The entire block usually lasts less than 15 minutes.  Conditions that may be treated with selective nerve root blocks:  Low back and leg pain  Spinal stenosis  Diagnostic block prior to potential surgery  Neck and arm pain  Post laminectomy syndrome  Preparation for the injection:  1. Do not eat any solid food or dairy products within 6 hours of your appointment. 2. You may drink clear liquids up to 2 hours before an appointment.  Clear liquids include water, black coffee, juice or soda.  No milk or cream please. 3. You may take your regular medications, including pain medications, with a sip of water before your appointment.  Diabetics should hold regular insulin (if taken separately) and take 1/2 normal NPH dose the morning of the procedure.  Carry some sugar containing items with you to your appointment. 4. A driver must accompany you and be prepared to drive you home after your procedure. 5. Bring all your current medications with you. 6. An IV may be inserted and sedation may be given at  the discretion of the physician. 7. A blood pressure cuff, EKG, and other monitors will often be applied during the procedure.  Some patients may need to have extra oxygen administered for a short period. 8. You will be asked to provide medical information, including allergies, prior to the procedure.  We must know immediately if you are taking blood  Thinners (like Coumadin) or if you are allergic to IV iodine contrast (dye).  Possible side-effects: All are usually temporary  Bleeding from needle site  Light headedness  Numbness and tingling  Decreased blood pressure  Weakness in arms/legs  Pressure sensation in back/neck  Pain at injection site (several days)  Possible complications: All are extremely rare  Infection  Nerve injury  Spinal headache (a headache wore with upright position)  Call if you experience:  Fever/chills associated with headache or increased back/neck pain  Headache worsened by an upright position  New onset weakness or numbness of an extremity below the injection site  Hives or difficulty breathing (go to the emergency room)  Inflammation or drainage at the injection site(s)  Severe back/neck pain greater than usual  New symptoms which are concerning to you  Do not drink any alcoholic beverages  Do not drive  Do not operate any appliances  And/or machinery that could be dangerous  Do not make any legal decisions  Watch for dizziness/weakness  Apply ice pack to area, 15 min on, 15 min off (do not put ice next to your skin) for the  first  24 hours, then you may apply heat  Teach back three things/pt verbalized understanding.  Discharge home via wheelchair at 1453 hours    Please note:  Although the local anesthetic injected can often make your back or neck feel good for several hours after the injection the pain will likely return.  It takes 3-5 days for steroids to work on the nerve root. You may not notice any pain relief  for at least one week.  If effective, we will often do a series of 3 injections spaced 3-6 weeks apart to maximally decrease your pain.    If you have any questions, please call [phone] [facility]

## 2014-05-30 NOTE — Progress Notes (Signed)
   Subjective:    Patient ID: [name], female    DOB: 1970/09/29, 44 y.o.   MRN: [id number]  HPI    Review of Systems     Objective:   Physical Exam        Assessment & Plan:     End time 1416pm  Fluor 1.4 donna moore   Report to Fannie Knee in recovery room versed 2mg  and 54mcq of fentanyl given iv

## 2014-05-30 NOTE — Procedures (Signed)
PROCEDURE PERFORMED: Lumbosacral selective nerve root block   NOTE: The patient is a 44 y.o. year-old female who returns to [place] for further evaluation and treatment of pain involving the lumbar and lower extremity region. Studies consisting of MRI has revealed the patient to be with evidence of multiple  Level degenerative changes of thoracic and lumbar regions. There is concern regarding intraspinal abnormalities contributing to the patient's symptomatology. The risks, benefits, and expectations of the procedure have been explained to the patient who was understanding and in agreement with suggested treatment plan. We will proceed with interventional treatment as discussed and as explained to the patient. The patient is understanding and in agreement with suggested treatment plan.   DESCRIPTION OF PROCEDURE: Lumbosacral selective nerve root block with IV Versed, IV fentanyl conscious sedation, EKG, blood pressure, pulse, and pulse oximetry monitoring. The procedure was performed with the patient in the prone position under fluoroscopic guidance. With the patient in the prone position, Betadine prep of proposed entry site was performed. Local anesthetic skin wheal of proposed needle entry site was prepared with 1.5% plain lidocaine with AP view of the lumbosacral spine.   PROCEDURE #1: Needle placement at the L3 vertebral body: A 22-gauge needle was inserted at the inferior border of the transverse process of the vertebral body with needle placed medial to the midline of the transverse process on AP view of the lumbosacral spine.  PROCEDURE #2: Needle placement at the L4 vertebral body: A 22-gauge needle was inserted at the inferior border of the transverse process of the vertebral body with needle placed medial to the midline of the transverse process on AP view of the lumbosacral spine.    PROCEDURE #3: Needle placement at the L5 vertebral body: A22-gauge needle was inserted at the  inferior border of the transverse process of the vertebral body with needle placed medial to the midline of the transverse process on AP view of the lumbosacral spine.   PROCEDURE #4: Needle placement at the S1 foramen. With the patient in the prone position with Betadine prep of proposed entry site accomplished, the S1 foramen was visualized under fluoroscopic guidance with AP view of the lumbosacral spine with cephalad orientation of the fluoroscope with local anesthetic skin wheal of 1.5% lidocaine of proposed needle entry site prepared. A 22-gauge needle was inserted S1 foramen under fluoroscopic guidance eliciting paresthesias radiating from the buttocks to the lower extremity after which needle was slightly withdrawn.   Needle placement was then verified on lateral view at all levels with needle tip documented to be in the posterior superior quadrant of the intervertebral foramen of  L 2, L 3, L 4, and L5. Following negative aspiration for heme and CSF at each level, each level was injected with 3 mL of 0.25% bupivacaine with Kenalog. The patient tolerated the procedure well. A total of 10 mg of Kenalog was utilized for the procedure.  3 PLAN:  1. Medications: Will continue presently prescribed medications. 2. The patient is to undergo follow-up evaluation with Dr. Alain Marion for evaluation of blood pressure and general medical condition status post procedure performed on today's visit. 3. Surgical follow-up evaluation. 4. Neurological evaluation. 5. May consider radiofrequency procedures, implantation type procedures and other treatment pending response to treatment and follow-up evaluation. 6. The patient has been advise do adhere to proper body mechanics and avoid activities which may aggravate condition. 7. The patient has been advised to call the [facility] prior to scheduled return appointment should there  be significant change in the patient's condition or should the patient  have other concerns regarding condition prior to scheduled return appointment.

## 2014-05-31 ENCOUNTER — Telehealth: Payer: Self-pay | Admitting: *Deleted

## 2014-05-31 NOTE — Telephone Encounter (Signed)
Post-procedure follow-up call, doing ok.

## 2014-06-02 ENCOUNTER — Other Ambulatory Visit (INDEPENDENT_AMBULATORY_CARE_PROVIDER_SITE_OTHER): Payer: Medicare Other

## 2014-06-02 DIAGNOSIS — D751 Secondary polycythemia: Secondary | ICD-10-CM | POA: Diagnosis not present

## 2014-06-02 LAB — CBC
HCT: 48.5 % — ABNORMAL HIGH (ref 36.0–46.0)
Hemoglobin: 17.2 g/dL — ABNORMAL HIGH (ref 12.0–15.0)
MCHC: 35.5 g/dL (ref 30.0–36.0)
MCV: 88.7 fl (ref 78.0–100.0)
Platelets: 280 10*3/uL (ref 150.0–400.0)
RBC: 5.48 Mil/uL — ABNORMAL HIGH (ref 3.87–5.11)
RDW: 12.9 % (ref 11.5–15.5)
WBC: 13.2 10*3/uL — ABNORMAL HIGH (ref 4.0–10.5)

## 2014-06-07 ENCOUNTER — Ambulatory Visit (INDEPENDENT_AMBULATORY_CARE_PROVIDER_SITE_OTHER): Payer: BLUE CROSS/BLUE SHIELD | Admitting: Internal Medicine

## 2014-06-07 ENCOUNTER — Encounter (HOSPITAL_COMMUNITY): Payer: Self-pay | Admitting: Psychiatry

## 2014-06-07 ENCOUNTER — Ambulatory Visit (INDEPENDENT_AMBULATORY_CARE_PROVIDER_SITE_OTHER): Payer: Commercial Indemnity | Admitting: Psychiatry

## 2014-06-07 ENCOUNTER — Encounter: Payer: Self-pay | Admitting: Internal Medicine

## 2014-06-07 VITALS — BP 141/89 | HR 92 | Ht 66.0 in | Wt 200.4 lb

## 2014-06-07 VITALS — BP 124/88 | HR 103 | Temp 98.7°F | Wt 201.0 lb

## 2014-06-07 DIAGNOSIS — I10 Essential (primary) hypertension: Secondary | ICD-10-CM | POA: Diagnosis not present

## 2014-06-07 DIAGNOSIS — K219 Gastro-esophageal reflux disease without esophagitis: Secondary | ICD-10-CM | POA: Diagnosis not present

## 2014-06-07 DIAGNOSIS — G43011 Migraine without aura, intractable, with status migrainosus: Secondary | ICD-10-CM | POA: Diagnosis not present

## 2014-06-07 DIAGNOSIS — G43809 Other migraine, not intractable, without status migrainosus: Secondary | ICD-10-CM | POA: Diagnosis not present

## 2014-06-07 DIAGNOSIS — F319 Bipolar disorder, unspecified: Secondary | ICD-10-CM

## 2014-06-07 DIAGNOSIS — F419 Anxiety disorder, unspecified: Secondary | ICD-10-CM

## 2014-06-07 DIAGNOSIS — F909 Attention-deficit hyperactivity disorder, unspecified type: Secondary | ICD-10-CM

## 2014-06-07 DIAGNOSIS — F988 Other specified behavioral and emotional disorders with onset usually occurring in childhood and adolescence: Secondary | ICD-10-CM

## 2014-06-07 MED ORDER — KETOROLAC TROMETHAMINE 30 MG/ML IJ SOLN
30.0000 mg | Freq: Once | INTRAMUSCULAR | Status: AC
  Administered 2014-06-07: 30 mg via INTRAMUSCULAR

## 2014-06-07 MED ORDER — ARIPIPRAZOLE 30 MG PO TABS: ORAL_TABLET | ORAL | Status: DC

## 2014-06-07 MED ORDER — DIAZEPAM 5 MG PO TABS: ORAL_TABLET | ORAL | Status: DC

## 2014-06-07 MED ORDER — BENZTROPINE MESYLATE 1 MG PO TABS: 1.0000 mg | ORAL_TABLET | Freq: Every day | ORAL | Status: DC

## 2014-06-07 MED ORDER — RANITIDINE HCL 300 MG PO TABS: 300.0000 mg | ORAL_TABLET | Freq: Every day | ORAL | Status: DC

## 2014-06-07 MED ORDER — LISDEXAMFETAMINE DIMESYLATE 30 MG PO CAPS: 30.0000 mg | ORAL_CAPSULE | Freq: Every day | ORAL | Status: DC

## 2014-06-07 NOTE — Assessment & Plan Note (Signed)
stable overall by history and exam, recent data reviewed with pt, and pt to continue medical treatment as before,  to f/u any worsening symptoms or concerns BP Readings from Last 3 Encounters:  06/07/14 124/88  06/07/14 141/89  05/30/14 96/52

## 2014-06-07 NOTE — Progress Notes (Signed)
[facility] [phone] Progress Note  [name] [id number] 44 y.o.  06/07/2014 11:14 AM  Chief Complaint:  I like Vyvanse.  I'm not irritable.  I still feel very tired sometimes.      History of Present Illness:  [name] came for her followup appointment.  She liked Vyvanse and her current medication.  Her attention and focus is much better.  She still have some time feeling of tiredness and fatigue which she believed due to her chronic health issues.  She has chronic pain , headaches and fibromyalgia.  She admitted Cogentin and Abilify is helping her mood swing, anger, agitation and depression.  She denies any major panic attack in recent weeks.  She is taking Valium mostly 1 a day but some time she needed second dose.  She sleeping good.  Recently she's seen her oncologist for phlebotomy due to her high hemoglobin.  She admitted after phlebotomy she feels more relaxed .  She denies any recent episode of crying spells, feeling of hopelessness or worthlessness.  She still have headaches and sometime she takes over-the-counter pain medication.  She's also getting Pamelor and Cymbalta for chronic pain and headache.  She had tried to come off from [employer] last year but her migraine headaches started to get worse.  Her appetite is okay.  Her vitals are stable.  Patient lives with her mother and her husband.  Her husband usually travels out of the town most of the time.  Patient denies drinking or using any illegal substances.  Suicidal Ideation: No Plan Formed: No Patient has means to carry out plan: No  Homicidal Ideation: No Plan Formed: No Patient has means to carry out plan: No  Past Psychiatric History/Hospitalization(s) Patient endorsed history of mood swings anger, irritability and ADD since childhood.  In the past she had tried Focalin, Vyvanse which worked very well until she lost her insurance.  She has seen [name] for the management of bipolar disorder and ADD.  She did  prescribe lithium which makes her careless, doxepin which helped her depression and quetiapine but patient did not see any improvement.  She also tried temazepam, Ambien which worked for only a short time.  She has given Valium with good response .  Patient denies any history of suicidal attempt but endorse suicidal ideation and stayed overnight in the psychiatric emergency room.  Patient denies any inpatient psychiatric treatment, hallucination, psychosis or mania.  However she endorse history of mood swing, irritability, Impulsive buying, spending money and panic attack .   Anxiety: Yes Bipolar Disorder: Yes  Depression: Yes Mania: Yes Psychosis: No Schizophrenia: No Personality Disorder: No Hospitalization for psychiatric illness: No History of Electroconvulsive Shock Therapy: No Prior Suicide Attempts: No  Medical History; Patient has multiple medical problems.  Her primary care physician is Dr. Lovena Gonzalez. She has hypertension, asthma, GERD, eczema, cystitis, vitamin D deficiency, chronic pain, chronic fatigue, urinary incontinence , migraine headaches, hyperlipidemia and narcolepsy.  Recently she had surgery for appendicitis  Review of Systems  Constitutional: Positive for malaise/fatigue.  Eyes: Negative for blurred vision.  Cardiovascular: Negative for chest pain and palpitations.  Gastrointestinal: Negative for nausea and abdominal pain.  Musculoskeletal:       Chronic pain  Skin: Negative for itching and rash.  Neurological: Positive for headaches. Negative for tingling and tremors.    Psychiatric: Agitation: No Hallucination: No Depressed Mood: No Insomnia: No Hypersomnia: No Altered Concentration: No Feels Worthless: No Grandiose Ideas: No Belief In Special Powers: No  New/Increased Substance Abuse: No Compulsions: No  Neurologic: Headache: Yes Seizure: No Paresthesias: No  Outpatient Encounter Prescriptions as of 06/07/2014  Medication Sig  . ARIPiprazole  (ABILIFY) 30 MG tablet Take one tablet by mouth one time daily  . aspirin (BAYER ASPIRIN) 325 MG tablet Take 1 tablet (325 mg total) by mouth daily.  . benztropine (COGENTIN) 1 MG tablet Take 1 tablet (1 mg total) by mouth at bedtime.  . butalbital-acetaminophen-caffeine (FIORICET) 50-325-40 MG per tablet Take 1 tablet by mouth every 6 (six) hours as needed for headache.  . diazepam (VALIUM) 5 MG tablet Take one tab daily and 2 nd if needed  . diclofenac sodium (VOLTAREN) 1 % GEL Apply 2-4 g topically 4 (four) times daily.  . DULoxetine (CYMBALTA) 30 MG capsule Take 1 capsule (30 mg total) by mouth 2 (two) times daily.  . Linaclotide (LINZESS) 145 MCG CAPS capsule Take 1-2 capsules (145-290 mcg total) by mouth daily as needed.  [name] lisdexamfetamine (VYVANSE) 30 MG capsule Take 1 capsule (30 mg total) by mouth daily.  . meclizine (ANTIVERT) 12.5 MG tablet Take 1 tablet (12.5 mg total) by mouth 3 (three) times daily as needed for dizziness.  . meloxicam (MOBIC) 15 MG tablet Take 1 tablet (15 mg total) by mouth daily as needed for pain.  . metoprolol (LOPRESSOR) 50 MG tablet Take 50 mg by mouth daily.  . nortriptyline (PAMELOR) 10 MG capsule 3 tabs by mouth at bedtime  . pantoprazole (PROTONIX) 40 MG tablet Take 1 tablet (40 mg total) by mouth daily.  . pregabalin (LYRICA) 50 MG capsule Take 1 capsule (50 mg total) by mouth 3 (three) times daily.  . traMADol (ULTRAM) 50 MG tablet Take 50 mg by mouth 4 (four) times daily as needed for moderate pain.   . [DISCONTINUED] ARIPiprazole (ABILIFY) 30 MG tablet Take one tablet by mouth one time daily  . [DISCONTINUED] benztropine (COGENTIN) 1 MG tablet Take 1 tablet (1 mg total) by mouth at bedtime.  . [DISCONTINUED] diazepam (VALIUM) 5 MG tablet Take one tab daily and 2 nd if needed  . [DISCONTINUED] lisdexamfetamine (VYVANSE) 30 MG capsule Take 1 capsule (30 mg total) by mouth daily.  . [DISCONTINUED] lisdexamfetamine (VYVANSE) 30 MG capsule Take 1 capsule  (30 mg total) by mouth daily.   Facility-Administered Encounter Medications as of 06/07/2014  Medication  . fentaNYL (SUBLIMAZE) injection 100 mcg  . orphenadrine (NORFLEX) injection 30 mg  . [DISCONTINUED] midazolam (VERSED) injection 5 mg    Recent Results (from the past 2160 hour(s))  Basic metabolic panel     Status: Abnormal   Collection Time: 03/16/14  8:14 AM  Result Value Ref Range   Sodium 136 135 - 145 mEq/L   Potassium 4.4 3.5 - 5.1 mEq/L   Chloride 97 96 - 112 mEq/L   CO2 25 19 - 32 mEq/L   Glucose, Bld 133 (H) 70 - 99 mg/dL   BUN 16 6 - 23 mg/dL   Creatinine, Ser 0.81 0.40 - 1.20 mg/dL   Calcium 10.4 8.4 - 10.5 mg/dL   GFR 82.69 >60.00 mL/min  CBC with Differential/Platelet     Status: Abnormal   Collection Time: 03/16/14  8:14 AM  Result Value Ref Range   WBC 7.0 4.0 - 10.5 K/uL   RBC 5.85 (H) 3.87 - 5.11 Mil/uL   Hemoglobin 18.3 (HH) 12.0 - 15.0 g/dL   HCT 52.4 (H) 36.0 - 46.0 %   MCV 89.5 78.0 - 100.0 fl   MCHC  34.8 30.0 - 36.0 g/dL   RDW 12.3 11.5 - 15.5 %   Platelets 253.0 150.0 - 400.0 K/uL   Neutrophils Relative % 57.4 43.0 - 77.0 %   Lymphocytes Relative 29.2 12.0 - 46.0 %   Monocytes Relative 10.6 3.0 - 12.0 %   Eosinophils Relative 2.2 0.0 - 5.0 %   Basophils Relative 0.6 0.0 - 3.0 %   Neutro Abs 4.0 1.4 - 7.7 K/uL   Lymphs Abs 2.1 0.7 - 4.0 K/uL   Monocytes Absolute 0.7 0.1 - 1.0 K/uL   Eosinophils Absolute 0.2 0.0 - 0.7 K/uL   Basophils Absolute 0.0 0.0 - 0.1 K/uL  Hepatic function panel     Status: Abnormal   Collection Time: 03/16/14  8:14 AM  Result Value Ref Range   Total Bilirubin 0.3 0.2 - 1.2 mg/dL   Bilirubin, Direct 0.0 0.0 - 0.3 mg/dL   Alkaline Phosphatase 141 (H) 39 - 117 U/L   AST 14 0 - 37 U/L   ALT 19 0 - 35 U/L   Total Protein 8.5 (H) 6.0 - 8.3 g/dL   Albumin 4.9 3.5 - 5.2 g/dL  CBC     Status: Abnormal   Collection Time: 03/24/14 12:31 PM  Result Value Ref Range   WBC 8.7 4.0 - 10.5 K/uL   RBC 5.18 (H) 3.87 - 5.11  Mil/uL   Platelets 241.0 150.0 - 400.0 K/uL   Hemoglobin 16.2 (H) 12.0 - 15.0 g/dL   HCT 46.2 (H) 36.0 - 46.0 %   MCV 89.1 78.0 - 100.0 fl   MCHC 35.1 30.0 - 36.0 g/dL   RDW 12.6 11.5 - 15.5 %  CBC     Status: Abnormal   Collection Time: 06/02/14  1:23 PM  Result Value Ref Range   WBC 13.2 (H) 4.0 - 10.5 K/uL   RBC 5.48 (H) 3.87 - 5.11 Mil/uL   Platelets 280.0 150.0 - 400.0 K/uL   Hemoglobin 17.2 (H) 12.0 - 15.0 g/dL   HCT 48.5 (H) 36.0 - 46.0 %   MCV 88.7 78.0 - 100.0 fl   MCHC 35.5 30.0 - 36.0 g/dL   RDW 12.9 11.5 - 15.5 %    Physical Exam: Constitutional:  BP 141/89 mmHg  Pulse 92  Ht 5\' 6"  (1.676 m)  Wt 200 lb 6.4 oz (90.901 kg)  BMI 32.36 kg/m2  Musculoskeletal: Strength & Muscle Tone: within normal limits Gait & Station: normal Patient leans: N/A  Mental Status Examination;   Patient is casually dressed and fairly groomed.  She is cooperative and maintained good eye contact. Her speech is fast but clear and coherent  Her attention and concentration is fair.  Her thought processes is logical.  She described her mood tired.  Her affect is mood appropriate.  She denies any auditory or visual hallucination.  She denies any active or passive suicidal thoughts or homicidal thoughts.  Her fund of knowledge is adequate.  There were no tremors or shakes.  There were no delusions, paranoia or any obsession.  There were no flight of ideas or any loose association present at this time.  She is alert and oriented x3.  Her insight judgment and impulse control is okay.     Assessment: Axis I:  Bipolar disorder NOS, attention deficit disorder, anxiety disorder NOS    Axis II:  Deferred   Axis III:  Past Medical History  Diagnosis Date  . Attention deficit disorder without mention of hyperactivity   . Anxiety state, unspecified   .  Pain in joint, site unspecified   . Unspecified asthma(493.90)   . Migraine without aura, without mention of intractable migraine without mention of  status migrainosus   . Depressive disorder, not elsewhere classified   . Dysuria   . Family history of diabetes mellitus   . Family history of asthma   . Family history of ischemic heart disease     fam h/o premature cardiac disease   . Female stress incontinence   . Esophageal reflux   . Unspecified essential hypertension   . Persistent disorder of initiating or maintaining sleep   . Insomnia, unspecified   . Pain in limb   . Tobacco use disorder   . Unspecified vitamin D deficiency   . Abnormal weight gain   . Dyslipidemia     hypertriglyceridemia  . Hypertension   . Chronic constipation 04/01/2013   Plan:  I review her records from primary care physician including recent blood work.  She has phlebotomy .  She still have chronic fatigue and feeling tiredness which could be due to long-standing chronic illness.  Since we switched from Focalin to Vyvanse her attention and concentration is improved.  She is able to do multitasking.  She believe Abilify and Cogentin helping her mood swing and anger and Valium helping her anxiety.  She has no side effects.  She does not ask for early refills of benzodiazepine.  At this time she is unable to afford counseling which I offered but she promised if her financial resources improved she will consider counseling.  I will continue Vyvanse 20 mg daily , Cogentin 1 mg daily, Abilify 30 mg daily and Valium 5 mg 1-2 tablet as needed.  She is getting nortriptyline and Cymbalta from her primary care physician.  I reviewed medication effects side effects risk and benefits.  Time spent 25 minutes , greater than 50% of the time spent in counseling, coordination of care, prognosis, long-term effect of medication and safety concerns.  Discuss weight maintenance and sleep hygiene and encouraged to keep appointment with primary care provider in regards to her medical condition .  I will see her again in 3 months.  Cyanne Delmar T., MD 06/07/2014

## 2014-06-07 NOTE — Assessment & Plan Note (Signed)
Ok to add zantac 30 qhs, avoid excess fluids and large meals before bedtime

## 2014-06-07 NOTE — Assessment & Plan Note (Signed)
Acute, ok for toradol IIM 30 mg,  to f/u any worsening symptoms or concerns

## 2014-06-07 NOTE — Patient Instructions (Signed)
You had the pain shot todayt (toradol)  Please take all new medication as prescribed - the zantac at bedtime  Please continue all other medications as before, and refills have been done if requested.  Please have the pharmacy call with any other refills you may need.  Please keep your appointments with your specialists as you may have planned

## 2014-06-07 NOTE — Progress Notes (Signed)
Pre visit review using our clinic review tool, if applicable. No additional management support is needed unless otherwise documented below in the visit note. 

## 2014-06-07 NOTE — Progress Notes (Signed)
Subjective:    Patient ID: [name], female    DOB: [birth year], 44 y.o.   MRN: [id number]  HPI  Here with acute onset HA today, right sided, throbbing, mod to severe, with photophobia and nausea, similar to previous, had to go to court today over speeding ticket and stress of living with mother increased recently.  Denies worsening depressive symptoms, suicidal ideation, or panic.  Also with worsening reflux however at night, admits to increased po forced fluids, sometimes in evening, tends to make it worse, but sort of desparate to lose wt and stay hydrated.  Overall good compliance with treatment, and good medicine tolerability - including the protonix Past Medical History  Diagnosis Date  . Attention deficit disorder without mention of hyperactivity   . Anxiety state, unspecified   . Pain in joint, site unspecified   . Unspecified asthma(493.90)   . Migraine without aura, without mention of intractable migraine without mention of status migrainosus   . Depressive disorder, not elsewhere classified   . Unspecified essential hypertension   . Unspecified vitamin D deficiency   . Hypertension   . Esophageal reflux    Past Surgical History  Procedure Laterality Date  . Partial hysterectomy  2007  . Tonsillectomy  04/29/00  . Incise and drain abcess  2011    Right axilla- Dr. Barkley Bruns  . Abdominal ultrasound  10/12/97  . Plantar fascia surgery  2000  . Transthoracic echocardiogram  11/2009    EF=>55%; trace MR & TR  . Nm myocar perf wall motion  11/2009    bruce myoview; perfusion defect in anterior region consistent with breast attenuation; remaining myocardium with normal perfusion; post-stress EF 74%; low risk scan   . Appendectomy  05/27/2013    gangrenous  . Abdominal hysterectomy    . Tonsillectomy Bilateral     reports that she has been smoking Cigarettes.  She has a 27 pack-year smoking history. She has never used smokeless tobacco. She reports that she does not drink  alcohol or use illicit drugs. family history includes ADD / ADHD in her child; Alcohol abuse in her brother and sister; Asthma in her child and another family member; Bipolar disorder in her sister; Breast cancer in her mother; COPD in her paternal grandmother; Cancer (age of onset: 92) in her mother; Coronary artery disease in an other family member; Depression in her mother; Diabetes in her paternal grandfather and another family member; Heart disease in her father, maternal grandfather, and paternal grandfather; Lupus in her sister; Multiple sclerosis in her mother. Allergies  Allergen Reactions  . Amoxicillin Itching    REACTION: QUESTIONABLE  . Doxycycline Nausea And Vomiting    REACTION: vomiting  . Hydrochlorothiazide     REACTION: CRAMPS AT HIGHER DOSAGES  . Lisinopril     Other reaction(s): Cough (finding) REACTION: COUGH  . Penicillins   . Percocet [Oxycodone-Acetaminophen] Itching    itching  . Talwin [Pentazocine] Nausea And Vomiting    n/v   Current Outpatient Prescriptions on File Prior to Visit  Medication Sig Dispense Refill  . ARIPiprazole (ABILIFY) 30 MG tablet Take one tablet by mouth one time daily 30 tablet 2  . aspirin (BAYER ASPIRIN) 325 MG tablet Take 1 tablet (325 mg total) by mouth daily. 100 tablet 3  . benztropine (COGENTIN) 1 MG tablet Take 1 tablet (1 mg total) by mouth at bedtime. 30 tablet 2  . butalbital-acetaminophen-caffeine (FIORICET) 50-325-40 MG per tablet Take 1 tablet by mouth every 6 (six)  hours as needed for headache. 20 tablet 0  . diazepam (VALIUM) 5 MG tablet Take one tab daily and 2 nd if needed 45 tablet 2  . diclofenac sodium (VOLTAREN) 1 % GEL Apply 2-4 g topically 4 (four) times daily.    . DULoxetine (CYMBALTA) 30 MG capsule Take 1 capsule (30 mg total) by mouth 2 (two) times daily. 60 capsule 5  . Linaclotide (LINZESS) 145 MCG CAPS capsule Take 1-2 capsules (145-290 mcg total) by mouth daily as needed. 60 capsule 5  . lisdexamfetamine  (VYVANSE) 30 MG capsule Take 1 capsule (30 mg total) by mouth daily. 30 capsule 0  . meloxicam (MOBIC) 15 MG tablet Take 1 tablet (15 mg total) by mouth daily as needed for pain. 30 tablet 5  . metoprolol (LOPRESSOR) 50 MG tablet Take 50 mg by mouth daily.    . nortriptyline (PAMELOR) 10 MG capsule 3 tabs by mouth at bedtime 90 capsule 5  . pantoprazole (PROTONIX) 40 MG tablet Take 1 tablet (40 mg total) by mouth daily. 90 tablet 3  . pregabalin (LYRICA) 50 MG capsule Take 1 capsule (50 mg total) by mouth 3 (three) times daily. 21 capsule 0  . traMADol (ULTRAM) 50 MG tablet Take 50 mg by mouth 4 (four) times daily as needed for moderate pain.     . meclizine (ANTIVERT) 12.5 MG tablet Take 1 tablet (12.5 mg total) by mouth 3 (three) times daily as needed for dizziness. (Patient not taking: Reported on 06/07/2014) 30 tablet 0   Current Facility-Administered Medications on File Prior to Visit  Medication Dose Route Frequency Provider Last Rate Last Dose  . fentaNYL (SUBLIMAZE) injection 100 mcg  100 mcg Intravenous UD Mohammed Kindle, MD      . orphenadrine (NORFLEX) injection 30 mg  30 mg Intramuscular Once Mohammed Kindle, MD        Review of Systems  Constitutional: Negative for unusual diaphoresis or night sweats HENT: Negative for ringing in ear or discharge Eyes: Negative for double vision or worsening visual disturbance.  Respiratory: Negative for choking and stridor.   Gastrointestinal: Negative for vomiting or other signifcant bowel change Genitourinary: Negative for hematuria or change in urine volume.  Musculoskeletal: Negative for other MSK pain or swelling Skin: Negative for color change and worsening wound.  Neurological: Negative for tremors and numbness other than noted  Psychiatric/Behavioral: Negative for decreased concentration or agitation other than above       Objective:   Physical Exam BP 124/88 mmHg  Pulse 103  Temp(Src) 98.7 F (37.1 C) (Oral)  Wt 201 lb (91.173 kg)   SpO2 98% VS noted,  Constitutional: Pt appears in no significant distress HENT: Head: NCAT.  Right Ear: External ear normal.  Left Ear: External ear normal.  Eyes: . Pupils are equal, round, and reactive to light. Conjunctivae and EOM are normal Neck: Normal range of motion. Neck supple.  Cardiovascular: Normal rate and regular rhythm.   Pulmonary/Chest: Effort normal and breath sounds without rales or wheezing.  Abd:  Soft, NT, ND, + BS Neurological: Pt is alert. Not confused , motor grossly intact Skin: Skin is warm. No rash, no LE edema Psychiatric: Pt behavior is normal. No agitation.         Assessment & Plan:

## 2014-06-13 ENCOUNTER — Telehealth: Payer: Self-pay | Admitting: Internal Medicine

## 2014-06-14 MED ORDER — BUTALBITAL-APAP-CAFFEINE 50-325-40 MG PO TABS: 1.0000 | ORAL_TABLET | Freq: Four times a day (QID) | ORAL | Status: DC | PRN

## 2014-06-14 NOTE — Telephone Encounter (Signed)
Rx faxed to pharmacy  

## 2014-06-14 NOTE — Addendum Note (Signed)
Addended by: Biagio Borg on: 06/14/2014 01:02 PM   Modules accepted: Orders

## 2014-06-14 NOTE — Telephone Encounter (Signed)
Done hardcopy to Dahlia  

## 2014-06-21 ENCOUNTER — Ambulatory Visit: Payer: BLUE CROSS/BLUE SHIELD | Attending: Pain Medicine | Admitting: Pain Medicine

## 2014-06-21 VITALS — BP 120/75 | HR 81 | Temp 98.2°F | Resp 14 | Wt 198.0 lb

## 2014-06-21 DIAGNOSIS — G588 Other specified mononeuropathies: Secondary | ICD-10-CM | POA: Diagnosis not present

## 2014-06-21 DIAGNOSIS — M545 Low back pain: Secondary | ICD-10-CM | POA: Diagnosis present

## 2014-06-21 DIAGNOSIS — M47894 Other spondylosis, thoracic region: Secondary | ICD-10-CM

## 2014-06-21 DIAGNOSIS — M5134 Other intervertebral disc degeneration, thoracic region: Secondary | ICD-10-CM

## 2014-06-21 DIAGNOSIS — M5136 Other intervertebral disc degeneration, lumbar region: Secondary | ICD-10-CM | POA: Diagnosis not present

## 2014-06-21 DIAGNOSIS — M47816 Spondylosis without myelopathy or radiculopathy, lumbar region: Secondary | ICD-10-CM | POA: Diagnosis not present

## 2014-06-21 DIAGNOSIS — M47814 Spondylosis without myelopathy or radiculopathy, thoracic region: Secondary | ICD-10-CM

## 2014-06-21 DIAGNOSIS — M62830 Muscle spasm of back: Secondary | ICD-10-CM | POA: Insufficient documentation

## 2014-06-21 DIAGNOSIS — M546 Pain in thoracic spine: Secondary | ICD-10-CM | POA: Diagnosis present

## 2014-06-21 MED ORDER — TRAMADOL HCL 50 MG PO TABS: ORAL_TABLET | ORAL | Status: DC

## 2014-06-21 NOTE — Progress Notes (Signed)
   Subjective:    Patient ID: [name], female    DOB: [birth year], 44 y.o.   MRN: [id number]  HPI    Review of Systems     Objective:   Physical Exam        Assessment & Plan:

## 2014-06-21 NOTE — Progress Notes (Signed)
   Subjective:    Patient ID: [name], female    DOB: [birth year], 44 y.o.   MRN: [id number]  HPI   Patient is 44 year old female returns to pain management for further evaluation and treatment of pain involving the upper mid and lower back regions lower extremity regions. Improvement of lower extremity pain on the right following lumbosacral selective nerve root blocks. Patient is with pain of the mid back region occurring in the region between the shoulder blades with severe spasms aggravated by reaching lifting pushing pulling maneuvers. Hopes of being able to decrease symptoms at time return appointment by undergoing intercostal nerve blocks which have been very beneficial in terms of reducing patient's pain. We will continue present medications and proceed with intercostal nerve block at time return appointment is discussed with patient on today's visit.     Review of Systems     Objective:   Physical Exam  Physical examination revealed tenderness to palpation in the paraspinal musculature region cervical region cervical facet region of mild degree. There was mild tenderness of the acromioclavicular and glenohumeral joint region and patient appeared to be with bilaterally equal grip  strength with unremarkable Spurling's maneuver. Palpation over the thoracic facet thoracic paraspinal musculature region was a tends to palpation of moderate to moderately severe discomfort with severe tenderness in the subscapularis muscle extremity region especially there was no excessive tends to palpation over the spinous processes of the thoracic region. Tinel and Phalen maneuver without increased pain of significant degree. Palpation of the lumbar paraspinal musculature region lumbar facet region associated with mild to moderate discomfort. With mild tinnitus of the gluteal and piriformis musculature region of mild tenderness of the greater trochanteric region. Straight leg raising was tolerated to  approximately 30 without increased pain with dorsiflexion noted. Abdomen nontender no costovertebral angle tenderness noted.    Assessment & Plan:    Degenerative disc disease thoracic spine T8-9 with chronic endplate changes with height loss of the vertebral bodies of the mid and lower thoracic region, posterior disc bulging seen in the lower thoracic region.  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  Intercostal nerve blocks to be performed at time of return appointment  F/U PCP for evaliation of  BP and general medical  condition.  F/U surgical evaluation.  F/U neurological evaluation.  May consider radiofrequency rhizolysis or intraspinal procedures pending response to present treatment and F/U evaluation.  [facility] to call [facility] should patient have concerns prior to scheduled return appointment.

## 2014-06-21 NOTE — Progress Notes (Signed)
Discharged at 1220, ambulatory

## 2014-06-21 NOTE — Patient Instructions (Addendum)
Continue present medications.  Intercostal nerve blocks on 07/06/2014  F/U PCP for evaliation of  BP and general medical  condition.  F/U surgical evaluation.  F/U neurological evaluation.  May consider radiofrequency rhizolysis or intraspinal procedures pending response to present treatment and F/U evaluation.  Patient to call [facility] should patient have concerns prior to scheduled return appointment. Intercostal Nerve Block Patient Information  Description: The twelve intercostal nerves arise from the first thru twelfth thoracic nerve roots.  The nerve begins at the spine and wraps around the body, lying in a groove underneath each rib.  Each intercostal nerve innervates a specific strip of skin and body walk of the abdomen and chest.  Therefore, injuries of the chest wall or abdominal wall result in pain that is transmitted back to the brian via the intercostal nerves.  Examples of such injuries include rib fractures and incisions for lung and gall bladder surgery.  Occasionally, pain may persist long after an injury or surgical incision secondary to inflammation and irritation of the intercostal nerve.  The longstanding pain is known as intercostal neuralgia.  An intercostal nerve block is preformed to eliminate pain either temporarily or permanently.  A small needle is placed below the rib and local anesthetic (like Novocaine) and possibly steroid is injected.  Usually 2-4 intercostal nerves are blocked at a time depending on the problem.  The patient will experience a slight "pin-prick" sensation for each injection.  Shortly thereafter, the strip of skin that is innervated by the blocked intercostal nerve will feel numb.  Persistent pain that is only temporarily relieved with local anesthetic may require a more permanent block. This procedure is called Cryoneurolysis and entails placing a small probe beneath the rib to freeze the nerve.  Conditions that may be treated by  intercostal nerve blocks:   Rib fractures  Longstanding pain from surgery of the chest or abdomen (intercostal neuralgia)  Pain from chest tubes  Pain from trauma to the chest  Preparation for the injections:  1. Do not eat any solid food or dairy products within 6 hours of your appointment. 2. You may drink clear liquids up to 2 hours before appointment.  Clear liquids include water, black coffee, juice or soda.  No milk or cream please. 3. You may take your regular medication, including pain medications, with a sip of water before your appointment.  Diabetics should hold regular insulin (if take separately) and take 1/2 normal NPH dose the morning of the procedure.   Carry some sugar containing items with you to your appointment. 4. A driver must accompany you and be prepared to drive you home after your procedure. 5. Bring all your current medications with you. 6. An IV may be inserted and sedation may be given at the discretion of the physician. 7. A blood pressure cuff, EKG and other monitors will often be applied during the procedure.  Some patients may need to have extra oxygen administered for a short period. 8. You will be asked to provide medical information, including your allergies, prior to the procedure.  We must know immediately if you are taking blood thinners (like Coumadin/Warfarin) or if you are allergic to IV iodine contrast (dye). We must know if you could possible be pregnant.  Possible side-effects:   Bleeding from needle site  Infection (rare)  Nerve injury (rare)  Numbness & tingling of skin  Collapsed lung requiring chest tube (rare)  Local anesthetic toxicity (rare)  Light-headedness (temporary)  Pain at injection  site (several days)  Decreased blood pressure (temporary)  Shortness of breath  Jittery/shaking sensation (temporary)  Call if you experience:   Difficulty breathing or hives (go directly to the emergency room)  Redness,  inflammation or drainage at the injection site  Severe pain at the site of the injection  Any new symptoms which are concerning   Please note:  Your pain may subside immediately but may return several hours after the injection.  Often, more than one injection is required to reduce the pain. Also, if several temporary blocks with local anesthetic are ineffective, a more permanent block with cryolysis may be necessary.  This will be discussed with you should this be the case.  If you have any questions, please call [phone] [facility]

## 2014-06-23 ENCOUNTER — Telehealth: Payer: Self-pay | Admitting: Pain Medicine

## 2014-06-23 ENCOUNTER — Telehealth: Payer: Self-pay | Admitting: Internal Medicine

## 2014-06-23 MED ORDER — BUDESONIDE-FORMOTEROL FUMARATE 160-4.5 MCG/ACT IN AERO: 2.0000 | INHALATION_SPRAY | Freq: Two times a day (BID) | RESPIRATORY_TRACT | Status: DC

## 2014-06-23 MED ORDER — ALBUTEROL SULFATE HFA 108 (90 BASE) MCG/ACT IN AERS: 2.0000 | INHALATION_SPRAY | RESPIRATORY_TRACT | Status: DC | PRN

## 2014-06-23 MED ORDER — ALBUTEROL SULFATE (2.5 MG/3ML) 0.083% IN NEBU: 2.5000 mg | INHALATION_SOLUTION | Freq: Four times a day (QID) | RESPIRATORY_TRACT | Status: DC | PRN

## 2014-06-23 NOTE — Telephone Encounter (Signed)
Called pt back she states that she is needing refill on her albuterol inhaler & solution for nebulizer. She hasn't had filled in a while. Inform pt will send to [place]...[name]

## 2014-06-23 NOTE — Telephone Encounter (Signed)
Patient had to go to Dentist today, they could not work on her tooth so they gave her meds / Norco 5/325  quanity 16 to get her thru to appt to have to fixed. She called to let us know

## 2014-06-23 NOTE — Telephone Encounter (Signed)
Pt called in and as a few question about what meds are on her meds list and what asthma  meds she is suppose to be taking.  She is needs a refill but she is not sure if the asthma med are still even on her med list?    Best number [phone]

## 2014-06-28 ENCOUNTER — Other Ambulatory Visit: Payer: Self-pay | Admitting: *Deleted

## 2014-06-28 NOTE — Telephone Encounter (Signed)
Patient call and instructed that she may take vicodin as ordered per her dentist. Received #[id number]  Of 5/325 to take every 4 hours as needed for tooth pain. Will document on medications.

## 2014-06-28 NOTE — Telephone Encounter (Signed)
Nurses Juliann Pulse and [place] Please informed patient that she may take medication prescribed by her dentist and please record this in the patient's chart

## 2014-06-29 ENCOUNTER — Encounter: Payer: Self-pay | Admitting: Internal Medicine

## 2014-06-30 ENCOUNTER — Other Ambulatory Visit: Payer: Self-pay | Admitting: Internal Medicine

## 2014-06-30 ENCOUNTER — Other Ambulatory Visit (INDEPENDENT_AMBULATORY_CARE_PROVIDER_SITE_OTHER): Payer: BLUE CROSS/BLUE SHIELD

## 2014-06-30 DIAGNOSIS — D751 Secondary polycythemia: Secondary | ICD-10-CM

## 2014-06-30 DIAGNOSIS — R739 Hyperglycemia, unspecified: Secondary | ICD-10-CM

## 2014-06-30 LAB — CBC WITH DIFFERENTIAL/PLATELET
BASOS ABS: 0 10*3/uL (ref 0.0–0.1)
Basophils Relative: 0.3 % (ref 0.0–3.0)
Eosinophils Absolute: 0.1 10*3/uL (ref 0.0–0.7)
Eosinophils Relative: 1 % (ref 0.0–5.0)
HEMATOCRIT: 44 % (ref 36.0–46.0)
Hemoglobin: 14.6 g/dL (ref 12.0–15.0)
LYMPHS ABS: 3.9 10*3/uL (ref 0.7–4.0)
LYMPHS PCT: 26.2 % (ref 12.0–46.0)
MCHC: 33.3 g/dL (ref 30.0–36.0)
MCV: 93.8 fl (ref 78.0–100.0)
MONOS PCT: 8.5 % (ref 3.0–12.0)
Monocytes Absolute: 1.3 10*3/uL — ABNORMAL HIGH (ref 0.1–1.0)
NEUTROS ABS: 9.4 10*3/uL — AB (ref 1.4–7.7)
NEUTROS PCT: 64 % (ref 43.0–77.0)
PLATELETS: 244 10*3/uL (ref 150.0–400.0)
RBC: 4.69 Mil/uL (ref 3.87–5.11)
RDW: 13 % (ref 11.5–15.5)
WBC: 14.7 10*3/uL — AB (ref 4.0–10.5)

## 2014-06-30 LAB — BASIC METABOLIC PANEL
BUN: 10 mg/dL (ref 6–23)
CO2: 25 mEq/L (ref 19–32)
Calcium: 9.1 mg/dL (ref 8.4–10.5)
Chloride: 103 mEq/L (ref 96–112)
Creatinine, Ser: 0.7 mg/dL (ref 0.40–1.20)
GFR: 97.73 mL/min (ref >60.00)
GLUCOSE: 66 mg/dL — AB (ref 70–99)
POTASSIUM: 4 meq/L (ref 3.5–5.1)
Sodium: 137 mEq/L (ref 135–145)

## 2014-06-30 LAB — HEMOGLOBIN A1C: Hgb A1c MFr Bld: 5.2 % (ref 4.6–6.5)

## 2014-07-02 ENCOUNTER — Encounter: Payer: Self-pay | Admitting: Emergency Medicine

## 2014-07-02 ENCOUNTER — Emergency Department: Payer: BLUE CROSS/BLUE SHIELD

## 2014-07-02 ENCOUNTER — Emergency Department
Admission: EM | Admit: 2014-07-02 | Discharge: 2014-07-02 | Disposition: A | Discharge status: Home / Self Care | Payer: BLUE CROSS/BLUE SHIELD | Attending: Emergency Medicine | Admitting: Emergency Medicine

## 2014-07-02 DIAGNOSIS — Z7982 Long term (current) use of aspirin: Secondary | ICD-10-CM | POA: Diagnosis not present

## 2014-07-02 DIAGNOSIS — Y998 Other external cause status: Secondary | ICD-10-CM | POA: Diagnosis not present

## 2014-07-02 DIAGNOSIS — S86912A Strain of unspecified muscle(s) and tendon(s) at lower leg level, left leg, initial encounter: Secondary | ICD-10-CM | POA: Diagnosis not present

## 2014-07-02 DIAGNOSIS — X58XXXA Exposure to other specified factors, initial encounter: Secondary | ICD-10-CM | POA: Insufficient documentation

## 2014-07-02 DIAGNOSIS — S8992XA Unspecified injury of left lower leg, initial encounter: Secondary | ICD-10-CM | POA: Diagnosis present

## 2014-07-02 DIAGNOSIS — I10 Essential (primary) hypertension: Secondary | ICD-10-CM | POA: Diagnosis not present

## 2014-07-02 DIAGNOSIS — Z880 Allergy status to penicillin: Secondary | ICD-10-CM | POA: Insufficient documentation

## 2014-07-02 DIAGNOSIS — M7989 Other specified soft tissue disorders: Secondary | ICD-10-CM | POA: Diagnosis not present

## 2014-07-02 DIAGNOSIS — Y9389 Activity, other specified: Secondary | ICD-10-CM | POA: Diagnosis not present

## 2014-07-02 DIAGNOSIS — M25562 Pain in left knee: Secondary | ICD-10-CM | POA: Diagnosis not present

## 2014-07-02 DIAGNOSIS — Y9289 Other specified places as the place of occurrence of the external cause: Secondary | ICD-10-CM | POA: Insufficient documentation

## 2014-07-02 DIAGNOSIS — Z720 Tobacco use: Secondary | ICD-10-CM | POA: Insufficient documentation

## 2014-07-02 DIAGNOSIS — Z791 Long term (current) use of non-steroidal anti-inflammatories (NSAID): Secondary | ICD-10-CM | POA: Insufficient documentation

## 2014-07-02 DIAGNOSIS — Z79899 Other long term (current) drug therapy: Secondary | ICD-10-CM | POA: Diagnosis not present

## 2014-07-02 MED ORDER — KETOROLAC TROMETHAMINE 30 MG/ML IJ SOLN: 60.0000 mg | Freq: Once | INTRAMUSCULAR | Status: AC

## 2014-07-02 MED ORDER — KETOROLAC TROMETHAMINE 60 MG/2ML IM SOLN
INTRAMUSCULAR | Status: AC
  Administered 2014-07-02: 60 mg via INTRAMUSCULAR
  Filled 2014-07-02: qty 2

## 2014-07-02 NOTE — ED Notes (Signed)
Fitted for crutches and gait training done.

## 2014-07-02 NOTE — ED Notes (Signed)
Patient c/o left knee pain. States that it has been going on "a while" but her knee gave out yesterday and the pain has worsened. Ambulatory to triage.

## 2014-07-02 NOTE — ED Provider Notes (Signed)
[facility] Emergency Department Provider Note  ____________________________________________  Time seen: Approximately 10:50 AM  I have reviewed the triage vital signs and the nursing notes.   HISTORY  Chief Complaint Knee Pain    HPI [name] is a 44 y.o. female presents for evaluation of left knee pain. States he gave out on her yesterday at the gym. Unable to do water aerobics or walk down steps. She reports falling 2 with her knee.   Past Medical History  Diagnosis Date  . Attention deficit disorder without mention of hyperactivity   . Anxiety state, unspecified   . Pain in joint, site unspecified   . Unspecified asthma(493.90)   . Migraine without aura, without mention of intractable migraine without mention of status migrainosus   . Depressive disorder, not elsewhere classified   . Unspecified essential hypertension   . Unspecified vitamin D deficiency   . Hypertension   . Esophageal reflux     Patient Active Problem List   Diagnosis Date Noted  . Chronic female pelvic pain 10/07/2013  . Well adult exam 10/01/2013  . Ovarian cyst, right 08/04/2013  . Painful sexual intercourse 06/29/2013  . Chronic constipation 04/01/2013  . Narcolepsy 04/02/2012  . LBP (low back pain) 04/02/2012  . Depression 10/22/2010  . Eczema 05/31/2010  . Hyperglycemia 04/28/2010  . GERD 09/28/2009  . Migraine without aura 08/10/2009  . ARTHRALGIA 08/10/2009  . INSOMNIA-SLEEP DISORDER-UNSPEC 08/10/2009  . FATIGUE 05/02/2009  . SMOKER 11/14/2008  . ABSCESS, SUPRAPUBIC 09/08/2008  . ADD 10/08/2007  . PARESTHESIA 10/08/2007  . WEIGHT GAIN 02/06/2007  . VITAMIN D DEFICIENCY 12/16/2006  . Anxiety state 11/12/2006  . Essential hypertension 11/12/2006  . Asthma 11/12/2006    Past Surgical History  Procedure Laterality Date  . Partial hysterectomy  2007  . Tonsillectomy  04/29/00  . Incise and drain abcess  2011    Right axilla- Dr. Barkley Bruns  .  Abdominal ultrasound  10/12/97  . Plantar fascia surgery  2000  . Transthoracic echocardiogram  11/2009    EF=>55%; trace MR & TR  . Nm myocar perf wall motion  11/2009    bruce myoview; perfusion defect in anterior region consistent with breast attenuation; remaining myocardium with normal perfusion; post-stress EF 74%; low risk scan   . Appendectomy  05/27/2013    gangrenous  . Abdominal hysterectomy    . Tonsillectomy Bilateral     Current Outpatient Rx  Name  Route  Sig  Dispense  Refill  . albuterol (PROAIR HFA) 108 (90 BASE) MCG/ACT inhaler   Inhalation   Inhale 2 puffs into the lungs every 4 (four) hours as needed. For shortness of breath.   18 g   2   . albuterol (PROVENTIL) (2.5 MG/3ML) 0.083% nebulizer solution   Nebulization   Take 3 mLs (2.5 mg total) by nebulization every 6 (six) hours as needed. For shortness of breath.   75 mL   0   . ARIPiprazole (ABILIFY) 30 MG tablet      Take one tablet by mouth one time daily   30 tablet   2   . aspirin (BAYER ASPIRIN) 325 MG tablet   Oral   Take 1 tablet (325 mg total) by mouth daily.   100 tablet   3   . benztropine (COGENTIN) 1 MG tablet   Oral   Take 1 tablet (1 mg total) by mouth at bedtime.   30 tablet   2   . budesonide-formoterol (SYMBICORT)  160-4.5 MCG/ACT inhaler   Inhalation   Inhale 2 puffs into the lungs 2 (two) times daily.   1 Inhaler   2   . butalbital-acetaminophen-caffeine (FIORICET) 50-325-40 MG per tablet   Oral   Take 1 tablet by mouth every 6 (six) hours as needed for headache.   20 tablet   0   . diazepam (VALIUM) 5 MG tablet      Take one tab daily and 2 nd if needed   45 tablet   2   . diclofenac sodium (VOLTAREN) 1 % GEL   Topical   Apply 2-4 g topically 4 (four) times daily.         . DULoxetine (CYMBALTA) 30 MG capsule   Oral   Take 1 capsule (30 mg total) by mouth 2 (two) times daily.   60 capsule   5   . Linaclotide (LINZESS) 145 MCG CAPS capsule   Oral   Take  1-2 capsules (145-290 mcg total) by mouth daily as needed.   60 capsule   5   . lisdexamfetamine (VYVANSE) 30 MG capsule   Oral   Take 1 capsule (30 mg total) by mouth daily.   30 capsule   0     No refill until 07/08/14   . meclizine (ANTIVERT) 12.5 MG tablet   Oral   Take 1 tablet (12.5 mg total) by mouth 3 (three) times daily as needed for dizziness.   30 tablet   0   . meloxicam (MOBIC) 15 MG tablet   Oral   Take 1 tablet (15 mg total) by mouth daily as needed for pain.   30 tablet   5     For pain   . metoprolol (LOPRESSOR) 50 MG tablet   Oral   Take 50 mg by mouth daily.         . nortriptyline (PAMELOR) 10 MG capsule      3 tabs by mouth at bedtime   90 capsule   5   . pantoprazole (PROTONIX) 40 MG tablet   Oral   Take 1 tablet (40 mg total) by mouth daily.   90 tablet   3   . pregabalin (LYRICA) 50 MG capsule   Oral   Take 1 capsule (50 mg total) by mouth 3 (three) times daily.   21 capsule   0   . ranitidine (ZANTAC) 300 MG tablet   Oral   Take 1 tablet (300 mg total) by mouth at bedtime.   90 tablet   3   . traMADol (ULTRAM) 50 MG tablet      Limit 1 tablet by mouth 3-6 times per day if tolerated   180 tablet   0     Allergies Amoxicillin; Doxycycline; Hydrochlorothiazide; Lisinopril; Penicillins; Percocet; and Talwin  Family History  Problem Relation Age of Onset  . Asthma      Family history  . Coronary artery disease      1st degree female < 50  . Diabetes      1st degree relative  . Breast cancer Mother   . Multiple sclerosis Mother   . Cancer Mother 69    breast ca  . Depression Mother   . Lupus Sister     PTSD  . Alcohol abuse Sister   . Bipolar disorder Sister   . Alcohol abuse Brother     also HTN, lupus, enlarged heart  . Heart disease Father     cardiac arrest  . Heart  disease Maternal Grandfather     cardiac arrest  . COPD Paternal Grandmother   . Diabetes Paternal Grandfather   . Heart disease Paternal  Grandfather   . ADD / ADHD Child   . Asthma Child     Social History History  Substance Use Topics  . Smoking status: Current Every Day Smoker -- 1.00 packs/day for 27 years    Types: Cigarettes  . Smokeless tobacco: Never Used  . Alcohol Use: No    Review of Systems Constitutional: No fever/chills Eyes: No visual changes. ENT: No sore throat. Cardiovascular: Denies chest pain. Respiratory: Denies shortness of breath. Gastrointestinal: No abdominal pain.  No nausea, no vomiting.  No diarrhea.  No constipation. Genitourinary: Negative for dysuria. Musculoskeletal: Positive for left knee pain. Skin: Negative for rash. Neurological: Negative for headaches, focal weakness or numbness.  10-point ROS otherwise negative.  ____________________________________________   PHYSICAL EXAM:  VITAL SIGNS: ED Triage Vitals  Enc Vitals Group     BP 07/02/14 1025 132/92 mmHg     Pulse Rate 07/02/14 1025 103     Resp 07/02/14 1025 18     Temp 07/02/14 1025 98.7 F (37.1 C)     Temp Source 07/02/14 1025 Oral     SpO2 07/02/14 1025 96 %     Weight 07/02/14 1025 198 lb (89.812 kg)     Height 07/02/14 1025 5\' 6"  (1.676 m)     Head Cir --      Peak Flow --      Pain Score 07/02/14 1027 5     Pain Loc --      Pain Edu? --      Excl. in [place]? --     Constitutional: Alert and oriented. Well appearing and in no acute distress. Eyes: Conjunctivae are normal. PERRL. EOMI. Head: Atraumatic. Nose: No congestion/rhinnorhea. Mouth/Throat: Mucous membranes are moist.  Oropharynx non-erythematous. Neck: No stridor.   Cardiovascular: Normal rate, regular rhythm. Grossly normal heart sounds.  Good peripheral circulation. Respiratory: Normal respiratory effort.  No retractions. Lungs CTAB. Gastrointestinal: Soft and nontender. No distention. No abdominal bruits. No CVA tenderness. Musculoskeletal: No effusion noted. Left knee with limited range of motion with lateralization able to flex bend and  straighten. Neurologic:  Normal speech and language. No gross focal neurologic deficits are appreciated. Speech is normal. No gait instability. Skin:  Skin is warm, dry and intact. No rash noted. Psychiatric: Mood and affect are normal. Speech and behavior are normal.  ____________________________________________   LABS (all labs ordered are listed, but only abnormal results are displayed)  Labs Reviewed - No data to display ____________________________________________  EKG  Not applicable ____________________________________________  RADIOLOGY  Interpreted by radiologist, reviewed by myself. Negative for acute fracture or dislocation   ____________________________________________   PROCEDURES  Procedure(s) performed: None  Critical Care performed: No  ____________________________________________   INITIAL IMPRESSION / ASSESSMENT AND PLAN / ED COURSE  Pertinent labs & imaging results that were available during my care of the patient were reviewed by me and considered in my medical decision making (see chart for details).  Diagnosis acute left knee strain. Patient understands to follow up with her PCP for possible MRI evaluation. She denies any other emergency medical complaints at this time. Will return to the ER with worsening symptomology. ____________________________________________   FINAL CLINICAL IMPRESSION(S) / ED DIAGNOSES  Final diagnoses:  Knee strain, left, initial encounter      Arlyss Repress, PA-C 07/02/14 [place], MD 07/02/14 1540

## 2014-07-02 NOTE — Discharge Instructions (Signed)

## 2014-07-03 ENCOUNTER — Other Ambulatory Visit: Payer: Self-pay | Admitting: Pain Medicine

## 2014-07-03 DIAGNOSIS — G588 Other specified mononeuropathies: Secondary | ICD-10-CM

## 2014-07-03 DIAGNOSIS — M5459 Other low back pain: Secondary | ICD-10-CM

## 2014-07-03 DIAGNOSIS — M51379 Other intervertebral disc degeneration, lumbosacral region without mention of lumbar back pain or lower extremity pain: Secondary | ICD-10-CM

## 2014-07-03 DIAGNOSIS — M545 Low back pain: Secondary | ICD-10-CM

## 2014-07-03 DIAGNOSIS — M5137 Other intervertebral disc degeneration, lumbosacral region: Secondary | ICD-10-CM | POA: Insufficient documentation

## 2014-07-03 DIAGNOSIS — M5134 Other intervertebral disc degeneration, thoracic region: Secondary | ICD-10-CM

## 2014-07-04 ENCOUNTER — Telehealth: Payer: Self-pay | Admitting: Pain Medicine

## 2014-07-04 NOTE — Telephone Encounter (Signed)
Nurse and Angie Please call patient regarding her knee.. We can consider injection of knee patient prefers treatment of knee. Please schedule geniculate nerve blocks of knee to be performed if Angie and insurance state that this is okay

## 2014-07-04 NOTE — Telephone Encounter (Signed)
[name] has injured her left knee / went to the ER they said she might need MRI / wants to discuss getting procedure on knee, she is sched for procedure on back 07-03-16 / please call patient to discuss

## 2014-07-04 NOTE — Telephone Encounter (Signed)
Dr. Crisp please advise. 

## 2014-07-05 ENCOUNTER — Ambulatory Visit (INDEPENDENT_AMBULATORY_CARE_PROVIDER_SITE_OTHER): Payer: BLUE CROSS/BLUE SHIELD | Admitting: Internal Medicine

## 2014-07-05 ENCOUNTER — Encounter: Payer: Self-pay | Admitting: Internal Medicine

## 2014-07-05 VITALS — BP 110/70 | HR 112 | Wt 198.0 lb

## 2014-07-05 DIAGNOSIS — S8392XD Sprain of unspecified site of left knee, subsequent encounter: Secondary | ICD-10-CM | POA: Diagnosis not present

## 2014-07-05 DIAGNOSIS — E559 Vitamin D deficiency, unspecified: Secondary | ICD-10-CM | POA: Diagnosis not present

## 2014-07-05 DIAGNOSIS — IMO0002 Reserved for concepts with insufficient information to code with codable children: Secondary | ICD-10-CM | POA: Insufficient documentation

## 2014-07-05 DIAGNOSIS — S8390XA Sprain of unspecified site of unspecified knee, initial encounter: Secondary | ICD-10-CM | POA: Insufficient documentation

## 2014-07-05 DIAGNOSIS — R739 Hyperglycemia, unspecified: Secondary | ICD-10-CM | POA: Diagnosis not present

## 2014-07-05 NOTE — Assessment & Plan Note (Signed)
6/16 acute, s/p fall -- L Knee brace Sports med ref

## 2014-07-05 NOTE — Assessment & Plan Note (Signed)
On Vit D 

## 2014-07-05 NOTE — Progress Notes (Signed)
Subjective:   C/o fall down the steps at [facility] last Sat - went to ER  F/u dizzy spells at times since Dec 2015 a couple times a week. No N/V. No new meds. No ETOH  F/u recent appendectomy 05/27/13. C/o severe pain w/each intercourse.Marland KitchenMarland KitchenSeeing Dr Toney Rakes. C/o lower abd pains post-appendectomy... Lost 100 lbs in 2 years on diet  F/u on bipolar disorder: cont to see Dr Adele Schilder F/u HAs - better She had a [facility] - Dr Primus Bravo  Dizziness Associated symptoms include coughing and fatigue. Pertinent negatives include no chills, congestion, nausea or rash.  Anxiety Symptoms include dizziness and nervous/anxious behavior. Patient reports no confusion, decreased concentration, nausea, shortness of breath or suicidal ideas.      The patient is here to follow up on chronic bipolar depression, anxiety, headaches and chronic moderate fibromyalgia and LBP symptoms partially controlled with medicines, diet and exercise. F/u HTN, asthma.  She is on [employer] disability for her bipolar  Wt Readings from Last 3 Encounters:  07/05/14 198 lb (89.812 kg)  07/02/14 198 lb (89.812 kg)  06/21/14 198 lb (89.812 kg)   BP Readings from Last 3 Encounters:  07/05/14 110/70  07/02/14 132/92  06/21/14 120/75       Review of Systems  Constitutional: Positive for fatigue. Negative for chills, activity change, appetite change and unexpected weight change.  HENT: Negative for congestion, mouth sores and sinus pressure.   Eyes: Negative for visual disturbance.  Respiratory: Positive for cough. Negative for chest tightness and shortness of breath.   Gastrointestinal: Negative for nausea.  Genitourinary: Negative for frequency, difficulty urinating and vaginal pain.  Musculoskeletal: Negative for gait problem.  Skin: Negative for pallor and rash.  Neurological: Positive for dizziness. Negative for tremors.  Psychiatric/Behavioral: Positive for behavioral problems, sleep disturbance and  dysphoric mood. Negative for suicidal ideas, confusion and decreased concentration. The patient is nervous/anxious.        Objective:   Physical Exam  Constitutional: She appears well-developed. No distress.  obese  HENT:  Head: Normocephalic.  Right Ear: External ear normal.  Left Ear: External ear normal.  Nose: Nose normal.  Mouth/Throat: Oropharynx is clear and moist.  Eyes: Conjunctivae are normal. Pupils are equal, round, and reactive to light. Right eye exhibits no discharge. Left eye exhibits no discharge.  Neck: Normal range of motion. Neck supple. No JVD present. No tracheal deviation present. No thyromegaly present.  Cardiovascular: Normal rate, regular rhythm and normal heart sounds.   Pulmonary/Chest: No stridor. No respiratory distress. She has no wheezes.  Abdominal: Soft. Bowel sounds are normal. She exhibits no distension and no mass. There is no tenderness. There is no rebound and no guarding.  Musculoskeletal: She exhibits no edema or tenderness.  Lymphadenopathy:    She has no cervical adenopathy.  Neurological: She displays tremor. She displays normal reflexes. No cranial nerve deficit. She exhibits normal muscle tone. Coordination normal.  Skin: No rash noted. No erythema.  Psychiatric: Her behavior is normal. Judgment and thought content normal. Her mood appears not anxious. Thought content is not paranoid. She does not exhibit a depressed mood. She expresses no homicidal and no suicidal ideation.  L knee is tender lat on L  Lab Results  Component Value Date   WBC 14.7* 06/30/2014   HGB 14.6 06/30/2014   HCT 44.0 06/30/2014   PLT 244.0 06/30/2014   GLUCOSE 66* 06/30/2014   CHOL 225* 10/01/2013   TRIG 98.0 10/01/2013   HDL  41.10 10/01/2013   LDLDIRECT 161.1 02/18/2013   LDLCALC 164* 10/01/2013   ALT 19 03/16/2014   AST 14 03/16/2014   NA 137 06/30/2014   K 4.0 06/30/2014   CL 103 06/30/2014   CREATININE 0.70 06/30/2014   BUN 10 06/30/2014   CO2 25  06/30/2014   TSH 1.83 10/01/2013   HGBA1C 5.2 06/30/2014           Assessment & Plan:

## 2014-07-05 NOTE — Progress Notes (Signed)
Pre visit review using our clinic review tool, if applicable. No additional management support is needed unless otherwise documented below in the visit note. 

## 2014-07-05 NOTE — Assessment & Plan Note (Signed)
Doing well 

## 2014-07-06 ENCOUNTER — Ambulatory Visit: Payer: BLUE CROSS/BLUE SHIELD | Attending: Pain Medicine | Admitting: Pain Medicine

## 2014-07-06 ENCOUNTER — Encounter: Payer: Self-pay | Admitting: Pain Medicine

## 2014-07-06 VITALS — BP 112/47 | HR 69 | Temp 98.3°F | Resp 16 | Ht 66.0 in | Wt 198.0 lb

## 2014-07-06 DIAGNOSIS — M545 Low back pain: Secondary | ICD-10-CM | POA: Insufficient documentation

## 2014-07-06 DIAGNOSIS — M5137 Other intervertebral disc degeneration, lumbosacral region: Secondary | ICD-10-CM

## 2014-07-06 DIAGNOSIS — M5134 Other intervertebral disc degeneration, thoracic region: Secondary | ICD-10-CM

## 2014-07-06 DIAGNOSIS — G588 Other specified mononeuropathies: Secondary | ICD-10-CM

## 2014-07-06 DIAGNOSIS — M546 Pain in thoracic spine: Secondary | ICD-10-CM | POA: Diagnosis present

## 2014-07-06 DIAGNOSIS — M172 Bilateral post-traumatic osteoarthritis of knee: Secondary | ICD-10-CM

## 2014-07-06 MED ORDER — TRIAMCINOLONE ACETONIDE 40 MG/ML IJ SUSP
INTRAMUSCULAR | Status: AC
  Administered 2014-07-06: 40 mg
  Filled 2014-07-06: qty 1

## 2014-07-06 MED ORDER — BUPIVACAINE HCL (PF) 0.25 % IJ SOLN
INTRAMUSCULAR | Status: AC
Start: 2014-07-06 — End: 2014-07-06
  Administered 2014-07-06: 30 mL
  Filled 2014-07-06: qty 30

## 2014-07-06 MED ORDER — MIDAZOLAM HCL 5 MG/5ML IJ SOLN
INTRAMUSCULAR | Status: AC
  Administered 2014-07-06: 4 mg via INTRAVENOUS
  Filled 2014-07-06: qty 5

## 2014-07-06 MED ORDER — ORPHENADRINE CITRATE 30 MG/ML IJ SOLN
INTRAMUSCULAR | Status: AC
  Administered 2014-07-06: 30 mg
  Filled 2014-07-06: qty 2

## 2014-07-06 MED ORDER — FENTANYL CITRATE (PF) 100 MCG/2ML IJ SOLN
INTRAMUSCULAR | Status: AC
  Administered 2014-07-06: 100 ug via INTRAVENOUS
  Filled 2014-07-06: qty 2

## 2014-07-06 NOTE — Patient Instructions (Addendum)
Continue present medications.  F/U PCP for evaliation of  BP and general medical  condition.  F/U surgical evaluation. Orthopedic evaluation of knee scheduled. Please keep appointment  MRI of left knee to be performed as discussed . Please keep appointment   F/U neurological evaluation.  May consider radiofrequency rhizolysis or intraspinal procedures pending response to present treatment and F/U evaluation.  Patient to call [facility] should patient have concerns prior to scheduled return appointment.    Pain Management Discharge Instructions  General Discharge Instructions :  If you need to reach your doctor call: Monday-Friday 8:00 am - 4:00 pm at [phone] or toll free [phone].  After clinic hours [phone] to have operator reach doctor.  Bring all of your medication bottles to all your appointments in the pain clinic.  To cancel or reschedule your appointment with Pain Management please remember to call 24 hours in advance to avoid a fee.  Refer to the educational materials which you have been given on: General Risks, I had my Procedure. Discharge Instructions, Post Sedation.  Post Procedure Instructions:  The drugs you were given will stay in your system until tomorrow, so for the next 24 hours you should not drive, make any legal decisions or drink any alcoholic beverages.  You may eat anything you prefer, but it is better to start with liquids then soups and crackers, and gradually work up to solid foods.  Please notify your doctor immediately if you have any unusual bleeding, trouble breathing or pain that is not related to your normal pain.  Depending on the type of procedure that was done, some parts of your body may feel week and/or numb.  This usually clears up by tonight or the next day.  Walk with the use of an assistive device or accompanied by an adult for the 24 hours.  You may use ice on the affected area for the first 24 hours.  Put  ice in a Ziploc bag and cover with a towel and place against area 15 minutes on 15 minutes off.  You may switch to heat after 24 hours.

## 2014-07-06 NOTE — Progress Notes (Signed)
Safety precautions to be maintained throughout the outpatient stay will include: orient to surroundings, keep bed in low position, maintain call bell within reach at all times, provide assistance with transfer out of bed and ambulation.  

## 2014-07-06 NOTE — Progress Notes (Signed)
   Subjective:    Patient ID: [name], female    DOB: 1970-12-24, 44 y.o.   MRN: [id number]  HPI  PROCEDURE PERFORMED:  Intercostal nerve block.  HISTORY OF PRESENT ILLNESS:  The patient is a 44 y.o. female who returns to the [facility] for further evaluation and treatment of pain involving the midportion of the back. There is concern regarding the patient's pain being due to significant component of intercostal neuralgia. The risks, benefits, and expectations of the procedure have been discussed and explained to the patient who was understanding and in agreement with suggested treatment plan. We will proceed with interventional treatment as discussed.   DESCRIPTION OF PROCEDURE: Intercostal nerve block with IV Versed, IV fentanyl conscious sedation, EKG, blood pressure, pulse, and pulse oximetry monitoring. The procedure was performed with the patient in the prone position under fluoroscopic guidance.   Intercostal nerve block, right side: With the patient in the prone position, Betadine prep of proposed entry site was performed under fluoroscopic guidance with AP view of the thoracic spine. Under fluoroscopic guidance, a 22 -gauge needle was inserted to contact bone of the 11th rib on the right side after which the needle was repositioned at the inferior border of the 11th rib on the right side under fluoroscopic guidance. Following documentation of needle placement, at the inferior border of the 11th rib on the right  side and negative aspiration, a total of 3 mL of 0.25% bupivacaine with Kenalog was injected for right (side) for 11th rib intercostal nerve block.   INTERCOSTAL NERVE BLOCKS AT T10 T9 T8 T7 T6  LEVELS on the right: The procedure was performed at ribs at T10 T9 T8 T7 and T6 exactly as was performed at the rib at the 11 on the right side utilizing the same technique and under fluoroscopic guidance levels as was performed at the previous level.  A total of of 10 mg  Kenalog was utilized for the procedure.   PLAN:   1. Medications: We will continue presently prescribed medications. 2. The patient is to follow up with primary care physician for further evaluation of blood pressure and general medical condition as discussed. 3. Surgical evaluation.  4. Neurological evaluation. 5. May consider the patient for additional studies pending response to treatment and follow-up evaluation. 6. May consider radiofrequency procedures, implantation type procedures and other treatment pending response to treatment and follow-up evaluation. 7. The patient has been advised to adhere to proper body mechanics and to call the [facility] prior to scheduled return appointment should there be significant change in condition or have other concerns regarding condition prior to scheduled return appointment 8.   MRI of the left knee scheduled to evaluate for degenerative joint disease changes tear is fractures and other               abnormalities which may be contributing to patient's pain and paresthesias of the left knee  9.    Orthopedic evaluation of left knee as planned   The patient is understanding and in agreement with suggested treatment plan.       Review of Systems     Objective:   Physical Exam        Assessment & Plan:

## 2014-07-07 ENCOUNTER — Telehealth: Payer: Self-pay | Admitting: *Deleted

## 2014-07-07 NOTE — Telephone Encounter (Signed)
No problems with procedure.

## 2014-07-08 ENCOUNTER — Other Ambulatory Visit: Payer: Self-pay | Admitting: *Deleted

## 2014-07-08 MED ORDER — NORTRIPTYLINE HCL 10 MG PO CAPS: ORAL_CAPSULE | ORAL | Status: DC

## 2014-07-14 ENCOUNTER — Ambulatory Visit
Admission: RE | Admit: 2014-07-14 | Discharge: 2014-07-14 | Disposition: A | Discharge status: Home / Self Care | Payer: BLUE CROSS/BLUE SHIELD | Source: Ambulatory Visit | Attending: Pain Medicine | Admitting: Pain Medicine

## 2014-07-14 DIAGNOSIS — M5137 Other intervertebral disc degeneration, lumbosacral region: Secondary | ICD-10-CM

## 2014-07-14 DIAGNOSIS — M5134 Other intervertebral disc degeneration, thoracic region: Secondary | ICD-10-CM

## 2014-07-14 DIAGNOSIS — M172 Bilateral post-traumatic osteoarthritis of knee: Secondary | ICD-10-CM

## 2014-07-14 DIAGNOSIS — M51379 Other intervertebral disc degeneration, lumbosacral region without mention of lumbar back pain or lower extremity pain: Secondary | ICD-10-CM

## 2014-07-14 DIAGNOSIS — M25562 Pain in left knee: Secondary | ICD-10-CM | POA: Diagnosis not present

## 2014-07-19 ENCOUNTER — Ambulatory Visit: Payer: BLUE CROSS/BLUE SHIELD | Attending: Pain Medicine | Admitting: Pain Medicine

## 2014-07-19 ENCOUNTER — Ambulatory Visit: Payer: Commercial Indemnity | Admitting: Licensed Clinical Social Worker

## 2014-07-19 ENCOUNTER — Ambulatory Visit (INDEPENDENT_AMBULATORY_CARE_PROVIDER_SITE_OTHER): Payer: Commercial Indemnity | Admitting: Licensed Clinical Social Worker

## 2014-07-19 ENCOUNTER — Telehealth: Payer: Self-pay

## 2014-07-19 VITALS — BP 129/81 | HR 98 | Temp 98.0°F | Resp 16 | Ht 66.0 in | Wt 197.0 lb

## 2014-07-19 DIAGNOSIS — M2240 Chondromalacia patellae, unspecified knee: Secondary | ICD-10-CM | POA: Insufficient documentation

## 2014-07-19 DIAGNOSIS — M5134 Other intervertebral disc degeneration, thoracic region: Secondary | ICD-10-CM | POA: Diagnosis not present

## 2014-07-19 DIAGNOSIS — G588 Other specified mononeuropathies: Secondary | ICD-10-CM | POA: Insufficient documentation

## 2014-07-19 DIAGNOSIS — F909 Attention-deficit hyperactivity disorder, unspecified type: Secondary | ICD-10-CM | POA: Diagnosis not present

## 2014-07-19 DIAGNOSIS — M179 Osteoarthritis of knee, unspecified: Secondary | ICD-10-CM | POA: Insufficient documentation

## 2014-07-19 DIAGNOSIS — F411 Generalized anxiety disorder: Secondary | ICD-10-CM | POA: Diagnosis not present

## 2014-07-19 DIAGNOSIS — M170 Bilateral primary osteoarthritis of knee: Secondary | ICD-10-CM

## 2014-07-19 DIAGNOSIS — M5459 Other low back pain: Secondary | ICD-10-CM

## 2014-07-19 DIAGNOSIS — M5136 Other intervertebral disc degeneration, lumbar region: Secondary | ICD-10-CM | POA: Diagnosis not present

## 2014-07-19 DIAGNOSIS — F988 Other specified behavioral and emotional disorders with onset usually occurring in childhood and adolescence: Secondary | ICD-10-CM

## 2014-07-19 DIAGNOSIS — M545 Low back pain: Secondary | ICD-10-CM | POA: Diagnosis present

## 2014-07-19 DIAGNOSIS — F319 Bipolar disorder, unspecified: Secondary | ICD-10-CM | POA: Diagnosis not present

## 2014-07-19 DIAGNOSIS — M1710 Unilateral primary osteoarthritis, unspecified knee: Secondary | ICD-10-CM | POA: Insufficient documentation

## 2014-07-19 DIAGNOSIS — M47816 Spondylosis without myelopathy or radiculopathy, lumbar region: Secondary | ICD-10-CM | POA: Diagnosis not present

## 2014-07-19 DIAGNOSIS — M5137 Other intervertebral disc degeneration, lumbosacral region: Secondary | ICD-10-CM

## 2014-07-19 DIAGNOSIS — M546 Pain in thoracic spine: Secondary | ICD-10-CM | POA: Diagnosis present

## 2014-07-19 MED ORDER — DICLOFENAC SODIUM 1 % TD GEL: TRANSDERMAL | Status: DC

## 2014-07-19 MED ORDER — ORPHENADRINE CITRATE ER 100 MG PO TB12: ORAL_TABLET | ORAL | Status: DC

## 2014-07-19 MED ORDER — TRAMADOL HCL 50 MG PO TABS: ORAL_TABLET | ORAL | Status: DC

## 2014-07-19 NOTE — Progress Notes (Signed)
Safety precautions to be maintained throughout the outpatient stay will include: orient to surroundings, keep bed in low position, maintain call bell within reach at all times, provide assistance with transfer out of bed and ambulation.  Discharge patient home ambulatory; instructions given on procedure. Script given for ultram Teach back 3 done
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
## 2014-07-19 NOTE — Patient Instructions (Addendum)
Continue present medications. Tramadol and Voltaren gel . Caution the muscle relaxant, Norflex, can cause drowsiness confusion and other side effects  Intercostal nerve blocks Monday, 07/25/2014  F/U PCP for evaliation of  BP and general medical  condition.  F/U surgical evaluation.  F/U neurological evaluation.  May consider radiofrequency rhizolysis or intraspinal procedures pending response to present treatment and F/U evaluation.  Patient to call [facility] should patient have concerns prior to scheduled return appointment. GENERAL RISKS AND COMPLICATIONS  What are the risk, side effects and possible complications? Generally speaking, most procedures are safe.  However, with any procedure there are risks, side effects, and the possibility of complications.  The risks and complications are dependent upon the sites that are lesioned, or the type of nerve block to be performed.  The closer the procedure is to the spine, the more serious the risks are.  Great care is taken when placing the radio frequency needles, block needles or lesioning probes, but sometimes complications can occur. 1. Infection: Any time there is an injection through the skin, there is a risk of infection.  This is why sterile conditions are used for these blocks.  There are four possible types of infection. 1. Localized skin infection. 2. Central Nervous System Infection-This can be in the form of Meningitis, which can be deadly. 3. Epidural Infections-This can be in the form of an epidural abscess, which can cause pressure inside of the spine, causing compression of the spinal cord with subsequent paralysis. This would require an emergency surgery to decompress, and there are no guarantees that the patient would recover from the paralysis. 4. Discitis-This is an infection of the intervertebral discs.  It occurs in about 1% of discography procedures.  It is difficult to treat and it may lead to surgery.         2. Pain: the needles have to go through skin and soft tissues, will cause soreness.       3. Damage to internal structures:  The nerves to be lesioned may be near blood vessels or    other nerves which can be potentially damaged.       4. Bleeding: Bleeding is more common if the patient is taking blood thinners such as  aspirin, Coumadin, Ticiid, Plavix, etc., or if he/she have some genetic predisposition  such as hemophilia. Bleeding into the spinal canal can cause compression of the spinal  cord with subsequent paralysis.  This would require an emergency surgery to  decompress and there are no guarantees that the patient would recover from the  paralysis.       5. Pneumothorax:  Puncturing of a lung is a possibility, every time a needle is introduced in  the area of the chest or upper back.  Pneumothorax refers to free air around the  collapsed lung(s), inside of the thoracic cavity (chest cavity).  Another two possible  complications related to a similar event would include: Hemothorax and Chylothorax.   These are variations of the Pneumothorax, where instead of air around the collapsed  lung(s), you may have blood or chyle, respectively.       6. Spinal headaches: They may occur with any procedures in the area of the spine.       7. Persistent CSF (Cerebro-Spinal Fluid) leakage: This is a rare problem, but may occur  with prolonged intrathecal or epidural catheters either due to the formation of a fistulous  track or a dural tear.  8. Nerve damage: By working so close to the spinal cord, there is always a possibility of  nerve damage, which could be as serious as a permanent spinal cord injury with  paralysis.       9. Death:  Although rare, severe deadly allergic reactions known as "Anaphylactic  reaction" can occur to any of the medications used.      10. Worsening of the symptoms:  We can always make thing worse.  What are the chances of something like this happening? Chances of any of this  occuring are extremely low.  By statistics, you have more of a chance of getting killed in a motor vehicle accident: while driving to the hospital than any of the above occurring .  Nevertheless, you should be aware that they are possibilities.  In general, it is similar to taking a shower.  Everybody knows that you can slip, hit your head and get killed.  Does that mean that you should not shower again?  Nevertheless always keep in mind that statistics do not mean anything if you happen to be on the wrong side of them.  Even if a procedure has a 1 (one) in a 1,000,000 (million) chance of going wrong, it you happen to be that one..Also, keep in mind that by statistics, you have more of a chance of having something go wrong when taking medications.  Who should not have this procedure? If you are on a blood thinning medication (e.g. Coumadin, Plavix, see list of "Blood Thinners"), or if you have an active infection going on, you should not have the procedure.  If you are taking any blood thinners, please inform your physician.  How should I prepare for this procedure?  Do not eat or drink anything at least six hours prior to the procedure.  Bring a driver with you .  It cannot be a taxi.  Come accompanied by an adult that can drive you back, and that is strong enough to help you if your legs get weak or numb from the local anesthetic.  Take all of your medicines the morning of the procedure with just enough water to swallow them.  If you have diabetes, make sure that you are scheduled to have your procedure done first thing in the morning, whenever possible.  If you have diabetes, take only half of your insulin dose and notify our nurse that you have done so as soon as you arrive at the clinic.  If you are diabetic, but only take blood sugar pills (oral hypoglycemic), then do not take them on the morning of your procedure.  You may take them after you have had the procedure.  Do not take aspirin  or any aspirin-containing medications, at least eleven (11) days prior to the procedure.  They may prolong bleeding.  Wear loose fitting clothing that may be easy to take off and that you would not mind if it got stained with Betadine or blood.  Do not wear any jewelry or perfume  Remove any nail coloring.  It will interfere with some of our monitoring equipment.  NOTE: Remember that this is not meant to be interpreted as a complete list of all possible complications.  Unforeseen problems may occur.  BLOOD THINNERS The following drugs contain aspirin or other products, which can cause increased bleeding during surgery and should not be taken for 2 weeks prior to and 1 week after surgery.  If you should need take something for relief of minor  pain, you may take acetaminophen which is found in Tylenol,m Datril, Anacin-3 and Panadol. It is not blood thinner. The products listed below are.  Do not take any of the products listed below in addition to any listed on your instruction sheet.  A.P.C or A.P.C with Codeine Codeine Phosphate Capsules #3 Ibuprofen Ridaura  ABC compound Congesprin Imuran rimadil  Advil Cope Indocin Robaxisal  Alka-Seltzer Effervescent Pain Reliever and Antacid Coricidin or Coricidin-D  Indomethacin Rufen  Alka-Seltzer plus Cold Medicine Cosprin Ketoprofen S-A-C Tablets  Anacin Analgesic Tablets or Capsules Coumadin Korlgesic Salflex  Anacin Extra Strength Analgesic tablets or capsules CP-2 Tablets Lanoril Salicylate  Anaprox Cuprimine Capsules Levenox Salocol  Anexsia-D Dalteparin Magan Salsalate  Anodynos Darvon compound Magnesium Salicylate Sine-off  Ansaid Dasin Capsules Magsal Sodium Salicylate  Anturane Depen Capsules Marnal Soma  APF Arthritis pain formula Dewitt's Pills Measurin Stanback  Argesic Dia-Gesic Meclofenamic Sulfinpyrazone  Arthritis Bayer Timed Release Aspirin Diclofenac Meclomen Sulindac  Arthritis pain formula Anacin Dicumarol Medipren Supac   Analgesic (Safety coated) Arthralgen Diffunasal Mefanamic Suprofen  Arthritis Strength Bufferin Dihydrocodeine Mepro Compound Suprol  Arthropan liquid Dopirydamole Methcarbomol with Aspirin Synalgos  ASA tablets/Enseals Disalcid Micrainin Tagament  Ascriptin Doan's Midol Talwin  Ascriptin A/D Dolene Mobidin Tanderil  Ascriptin Extra Strength Dolobid Moblgesic Ticlid  Ascriptin with Codeine Doloprin or Doloprin with Codeine Momentum Tolectin  Asperbuf Duoprin Mono-gesic Trendar  Aspergum Duradyne Motrin or Motrin IB Triminicin  Aspirin plain, buffered or enteric coated Durasal Myochrisine Trigesic  Aspirin Suppositories Easprin Nalfon Trillsate  Aspirin with Codeine Ecotrin Regular or Extra Strength Naprosyn Uracel  Atromid-S Efficin Naproxen Ursinus  Auranofin Capsules Elmiron Neocylate Vanquish  Axotal Emagrin Norgesic Verin  Azathioprine Empirin or Empirin with Codeine Normiflo Vitamin E  Azolid Emprazil Nuprin Voltaren  Bayer Aspirin plain, buffered or children's or timed BC Tablets or powders Encaprin Orgaran Warfarin Sodium  Buff-a-Comp Enoxaparin Orudis Zorpin  Buff-a-Comp with Codeine Equegesic Os-Cal-Gesic   Buffaprin Excedrin plain, buffered or Extra Strength Oxalid   Bufferin Arthritis Strength Feldene Oxphenbutazone   Bufferin plain or Extra Strength Feldene Capsules Oxycodone with Aspirin   Bufferin with Codeine Fenoprofen Fenoprofen Pabalate or Pabalate-SF   Buffets II Flogesic Panagesic   Buffinol plain or Extra Strength Florinal or Florinal with Codeine Panwarfarin   Buf-Tabs Flurbiprofen Penicillamine   Butalbital Compound Four-way cold tablets Penicillin   Butazolidin Fragmin Pepto-Bismol   Carbenicillin Geminisyn Percodan   Carna Arthritis Reliever Geopen Persantine   Carprofen Gold's salt Persistin   Chloramphenicol Goody's Phenylbutazone   Chloromycetin Haltrain Piroxlcam   Clmetidine heparin Plaquenil   Cllnoril Hyco-pap Ponstel   Clofibrate Hydroxy  chloroquine Propoxyphen         Before stopping any of these medications, be sure to consult the physician who ordered them.  Some, such as Coumadin (Warfarin) are ordered to prevent or treat serious conditions such as "deep thrombosis", "pumonary embolisms", and other heart problems.  The amount of time that you may need off of the medication may also vary with the medication and the reason for which you were taking it.  If you are taking any of these medications, please make sure you notify your pain physician before you undergo any procedures.         Intercostal Nerve Block Patient Information  Description: The twelve intercostal nerves arise from the first thru twelfth thoracic nerve roots.  The nerve begins at the spine and wraps around the body, lying in a groove underneath each rib.  Each intercostal nerve innervates  a specific strip of skin and body walk of the abdomen and chest.  Therefore, injuries of the chest wall or abdominal wall result in pain that is transmitted back to the brian via the intercostal nerves.  Examples of such injuries include rib fractures and incisions for lung and gall bladder surgery.  Occasionally, pain may persist long after an injury or surgical incision secondary to inflammation and irritation of the intercostal nerve.  The longstanding pain is known as intercostal neuralgia.  An intercostal nerve block is preformed to eliminate pain either temporarily or permanently.  A small needle is placed below the rib and local anesthetic (like Novocaine) and possibly steroid is injected.  Usually 2-4 intercostal nerves are blocked at a time depending on the problem.  The patient will experience a slight "pin-prick" sensation for each injection.  Shortly thereafter, the strip of skin that is innervated by the blocked intercostal nerve will feel numb.  Persistent pain that is only temporarily relieved with local anesthetic may require a more permanent block. This procedure  is called Cryoneurolysis and entails placing a small probe beneath the rib to freeze the nerve.  Conditions that may be treated by intercostal nerve blocks:   Rib fractures  Longstanding pain from surgery of the chest or abdomen (intercostal neuralgia)  Pain from chest tubes  Pain from trauma to the chest  Preparation for the injections:  1. Do not eat any solid food or dairy products within 6 hours of your appointment. 2. You may drink clear liquids up to 2 hours before appointment.  Clear liquids include water, black coffee, juice or soda.  No milk or cream please. 3. You may take your regular medication, including pain medications, with a sip of water before your appointment.  Diabetics should hold regular insulin (if take separately) and take 1/2 normal NPH dose the morning of the procedure.   Carry some sugar containing items with you to your appointment. 4. A driver must accompany you and be prepared to drive you home after your procedure. 5. Bring all your current medications with you. 6. An IV may be inserted and sedation may be given at the discretion of the physician. 7. A blood pressure cuff, EKG and other monitors will often be applied during the procedure.  Some patients may need to have extra oxygen administered for a short period. 8. You will be asked to provide medical information, including your allergies, prior to the procedure.  We must know immediately if you are taking blood thinners (like Coumadin/Warfarin) or if you are allergic to IV iodine contrast (dye). We must know if you could possible be pregnant.  Possible side-effects:   Bleeding from needle site  Infection (rare)  Nerve injury (rare)  Numbness & tingling of skin  Collapsed lung requiring chest tube (rare)  Local anesthetic toxicity (rare)  Light-headedness (temporary)  Pain at injection site (several days)  Decreased blood pressure (temporary)  Shortness of breath  Jittery/shaking  sensation (temporary)  Call if you experience:   Difficulty breathing or hives (go directly to the emergency room)  Redness, inflammation or drainage at the injection site  Severe pain at the site of the injection  Any new symptoms which are concerning   Please note:  Your pain may subside immediately but may return several hours after the injection.  Often, more than one injection is required to reduce the pain. Also, if several temporary blocks with local anesthetic are ineffective, a more permanent block with cryolysis may  be necessary.  This will be discussed with you should this be the case.  If you have any questions, please call [phone] [facility]

## 2014-07-19 NOTE — Telephone Encounter (Signed)
[employer] needs a prior authorization for the norflex. She called her  insurance company and they gave her a PA number of # [id number]. [employer].

## 2014-07-19 NOTE — Progress Notes (Signed)
Subjective:    Patient ID: [name], female    DOB: 12-Jun-1970, 44 y.o.   MRN: [id number]  HPI Patient is 44 year old female returns to [place] for further evaluation and treatment of pain involving the region of the upper mid and lower back and lower extremity region with significant muscle spasms occurring in the upper and mid back region especially on the right. Patient states she has had significant improvement and decrease of spasms involving the upper and mid back regions with prior interventional treatment. We also discussed additional medication modifications and will add Norflex to patient's treatment regimen and attempt to decrease to severe spasms of the thoracic or lumbar region. We will proceed with intercostal nerve blocks at time return appointment which have been very effective in terms of decreasing the spasm and will continue other medication tramadol. Patient is with known degenerative changes of the thoracic spine in addition to what appears to be component of intercostal neuralgia with severe muscle spasms. We will proceed with intercostal nerve blocks as well as prescribed Norflex in attempt to decrease severity of patient's symptoms minimize progression of patient's symptoms and hopefully avoid the need for more involved treatment. The patient was understanding and agrees suggested treatment plan.      Review of Systems     Objective:   Physical Exam  There was tenderness to palpation of the splenius capitis and occipitalis musculature region of mild degree. Palpation of the acromioclavicular and glenohumeral joint regions reproduced mild discomfort. Patient appeared to be with slightly decreased grip strength. Tinel and Phalen's maneuver were without increase of pain of significant degree. Palpation of the thoracic facet thoracic paraspinal must region was a tends to palpation of moderate degree with evidence of moderate to moderately severe muscle  spasms on the right compared to the left. There was no crepitus of the thoracic region noted. Palpation over the lumbar paraspinal must region lumbar facet region was a tends to palpation with lateral bending and rotation and extension and palpation of the lumbar facets reproducing moderate discomfort area straight leg raising was tolerates approximately 30 without increased pain with dorsiflexion noted. Examination of the left knee was a tends to palpation of the knee with crepitus of the knee and negative anterior and posterior drawer signs. There was no definite ballottement of the patella. There was tends to palpation of the knee without definite increased laxity of the joint noted. There was tenderness along the greater trochanteric region iliotibial band region of mild degree. There was negative clonus negative Homans. Abdomen nontender and no costovertebral angle tenderness was noted.      Assessment & Plan:  Degenerative disc disease thoracic spine Multilevel degenerative changes of the thoracic spine with T8-9 with chronic endplate changes and height loss of the vertebral bodies of the mid and lower thoracic spine, posterior bulging seen in the lower thoracic region.  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 Tramadol and Voltaren gel and begin Norflex as discussed  Intercostal nerve blocks to be performed 07/25/2014  F/U PCP for evaliation of  BP and general medical  Condition.  Orthopedic evaluation of knee as scheduled  F/U surgical evaluation.  F/U neurological evaluation.  May consider radiofrequency rhizolysis or intraspinal procedures  pending response to present treatment and F/U  evaluation.  Patient to call [facility] should patient have concerns prior to scheduled return appointment.

## 2014-07-20 ENCOUNTER — Encounter: Payer: Self-pay | Admitting: Family Medicine

## 2014-07-20 ENCOUNTER — Ambulatory Visit (INDEPENDENT_AMBULATORY_CARE_PROVIDER_SITE_OTHER): Payer: Medicare Other | Admitting: Family Medicine

## 2014-07-20 VITALS — BP 136/88 | HR 119 | Ht 66.0 in | Wt 199.0 lb

## 2014-07-20 DIAGNOSIS — M9420 Chondromalacia, unspecified site: Secondary | ICD-10-CM | POA: Diagnosis not present

## 2014-07-20 DIAGNOSIS — F419 Anxiety disorder, unspecified: Secondary | ICD-10-CM | POA: Insufficient documentation

## 2014-07-20 DIAGNOSIS — F411 Generalized anxiety disorder: Secondary | ICD-10-CM | POA: Insufficient documentation

## 2014-07-20 DIAGNOSIS — M94262 Chondromalacia, left knee: Secondary | ICD-10-CM

## 2014-07-20 DIAGNOSIS — F319 Bipolar disorder, unspecified: Secondary | ICD-10-CM | POA: Insufficient documentation

## 2014-07-20 DIAGNOSIS — F988 Other specified behavioral and emotional disorders with onset usually occurring in childhood and adolescence: Secondary | ICD-10-CM | POA: Insufficient documentation

## 2014-07-20 MED ORDER — VITAMIN D (ERGOCALCIFEROL) 1.25 MG (50000 UNIT) PO CAPS: 50000.0000 [IU] | ORAL_CAPSULE | ORAL | Status: DC

## 2014-07-20 NOTE — Patient Instructions (Signed)
Good to see you.  Ice 20 minutes 2 times daily. Usually after activity and before bed. Exercises 3 times a week.  Weekly vitamin D Turmeric 500mg  daily Biking and swimming would be best for now Try new brace See me again in 3 weeks and if not a lot better we will consider injection.

## 2014-07-20 NOTE — Assessment & Plan Note (Signed)
Vision does have grade 4 chondral malacia to the knee. This is could this be giving her some of the discomfort that she is having. Patient will be put into a tracking brace which I think will be more beneficial than the stability brace. We discussed icing regimen and home exercises. We discussed which activities to potentially avoid. Patient work with Product/process development scientist today that I think a be beneficial. Patient is already taking oral anti-inflammatory's and will try a new topical anti-inflammatory. Patient does have chronic pain and is seen a pain specialist. This discomfort in the picture somewhat. Patient will come back in 3 weeks if continuing have pain we can consider a trial of a steroidal injection.

## 2014-07-20 NOTE — Telephone Encounter (Signed)
CALLED PATIENT TO GO OVER LIST OF MEDICATIONS FOR PA FOR NORFLEX. PRIOR AUTHORIZATION FOR [facility] FAXED 07/20/2014

## 2014-07-20 NOTE — Progress Notes (Signed)
Pre visit review using our clinic review tool, if applicable. No additional management support is needed unless otherwise documented below in the visit note. 

## 2014-07-20 NOTE — Progress Notes (Signed)
[name] [address] [place], [place] [address] Phone: [phone] Subjective:    I'm seeing this patient by the request  of:  Walker Kehr, MD   CC:  Left knee pain  GBM:[id number] [name] is a 44 y.o. female coming in with complaint of left knee pain. Patient states that the knee pain is mostly bilateral. Has noticed though more on the left knee. Patient has had the pain seemed to be worsening over the course last several weeks. Patient had so severe that he she did discuss this with her pain management physician. He diagnosed it as more of a post traumatic arthritis. Patient had a x-ray which was unremarkable and an MRI of the left knee was ordered on June 16. MRI was reviewed by me and showed 10 mm area of grade 4 chondromalacia of the lateral facet of the patella. Patient also has grade 2 chondral malacia of the apex of the patella. Patient states that sometimes the knee feels like it can lock on her where she is going up or down stairs. Patient is taking care of her mother who is sickly and this makes it very difficult to do certain things when she is squatting. Patient states sometimes it seems that the knee is swelling. Denies any radiation past the knee or any numbness or weakness. States that it is starting to affect her daily activities. Patient was given a brace that is somewhat helpful.  Past Medical History  Diagnosis Date  . Attention deficit disorder without mention of hyperactivity   . Anxiety state, unspecified   . Pain in joint, site unspecified   . Unspecified asthma(493.90)   . Migraine without aura, without mention of intractable migraine without mention of status migrainosus   . Depressive disorder, not elsewhere classified   . Unspecified essential hypertension   . Unspecified vitamin D deficiency   . Hypertension   . Esophageal reflux    Past Surgical History  Procedure Laterality Date  . Partial hysterectomy  2007  .  Tonsillectomy  04/29/00  . Incise and drain abcess  2011    Right axilla- Dr. Barkley Bruns  . Abdominal ultrasound  10/12/97  . Plantar fascia surgery  2000  . Transthoracic echocardiogram  11/2009    EF=>55%; trace MR & TR  . Nm myocar perf wall motion  11/2009    bruce myoview; perfusion defect in anterior region consistent with breast attenuation; remaining myocardium with normal perfusion; post-stress EF 74%; low risk scan   . Appendectomy  05/27/2013    gangrenous  . Abdominal hysterectomy    . Tonsillectomy Bilateral    History  Substance Use Topics  . Smoking status: Current Every Day Smoker -- 1.00 packs/day for 27 years    Types: Cigarettes  . Smokeless tobacco: Never Used  . Alcohol Use: No   Allergies  Allergen Reactions  . Amoxicillin Itching    REACTION: QUESTIONABLE  . Doxycycline Nausea And Vomiting    REACTION: vomiting  . Hydrochlorothiazide     REACTION: CRAMPS AT HIGHER DOSAGES  . Lisinopril     Other reaction(s): Cough (finding) REACTION: COUGH  . Penicillins   . Percocet [Oxycodone-Acetaminophen] Itching    itching  . Talwin [Pentazocine] Nausea And Vomiting    n/v   Family History  Problem Relation Age of Onset  . Asthma      Family history  . Coronary artery disease      1st degree female < 50  .  Diabetes      1st degree relative  . Breast cancer Mother   . Multiple sclerosis Mother   . Cancer Mother 18    breast ca  . Depression Mother   . Lupus Sister     PTSD  . Alcohol abuse Sister   . Bipolar disorder Sister   . Alcohol abuse Brother     also HTN, lupus, enlarged heart  . Heart disease Father     cardiac arrest  . Heart disease Maternal Grandfather     cardiac arrest  . COPD Paternal Grandmother   . Diabetes Paternal Grandfather   . Heart disease Paternal Grandfather   . ADD / ADHD Child   . Asthma Child        Past medical history, social, surgical and family history all reviewed in electronic medical record.   Review of  Systems: No headache, visual changes, nausea, vomiting, diarrhea, constipation, dizziness, abdominal pain, skin rash, fevers, chills, night sweats, weight loss, swollen lymph nodes, body aches, joint swelling, muscle aches, chest pain, shortness of breath, mood changes.   Objective Blood pressure 136/88, pulse 119, height 5\' 6"  (1.676 m), weight 199 lb (90.266 kg), SpO2 95 %.  General: No apparent distress alert and oriented x3 mood and affect normal, dressed appropriately.  HEENT: Pupils equal, extraocular movements intact  Respiratory: Patient's speak in full sentences and does not appear short of breath  Cardiovascular: No lower extremity edema, non tender, no erythema  Skin: Warm dry intact with no signs of infection or rash on extremities or on axial skeleton.  Abdomen: Soft nontender  Neuro: Cranial nerves II through XII are intact, neurovascularly intact in all extremities with 2+ DTRs and 2+ pulses.  Lymph: No lymphadenopathy of posterior or anterior cervical chain or axillae bilaterally.  Gait normal with good balance and coordination.  MSK:  Non tender with full range of motion and good stability and symmetric strength and tone of shoulders, elbows, wrist, hip, and ankles bilaterally.  Knee: Left Normal to inspection with no erythema or effusion or obvious bony abnormalities. Patient shows the patient is minimally tender to palpation over the superior lateral patella ROM full in flexion and extension and lower leg rotation. Ligaments with solid consistent endpoints including ACL, PCL, LCL, MCL. Negative Mcmurray's, Apley's, and Thessalonian tests. painful patellar compression. Patellar glide with moderate crepitus. J lateral tracking noted Patellar and quadriceps tendons unremarkable. Hamstring and quadriceps strength is normal.   Procedure note 32202; 15 minutes spent for Therapeutic exercises as stated in above notes.  This included exercises focusing on stretching,  strengthening, with significant focus on eccentric aspects.  Flexion and extension, VMO strengthening and hip abductors.  Proper technique shown and discussed handout in great detail with ATC.  All questions were discussed and answered.      Impression and Recommendations:     This case required medical decision making of moderate complexity.

## 2014-07-25 ENCOUNTER — Telehealth: Payer: Self-pay | Admitting: *Deleted

## 2014-07-25 ENCOUNTER — Ambulatory Visit: Payer: BLUE CROSS/BLUE SHIELD | Attending: Pain Medicine | Admitting: Pain Medicine

## 2014-07-25 ENCOUNTER — Encounter: Payer: Self-pay | Admitting: Pain Medicine

## 2014-07-25 VITALS — BP 104/58 | HR 72 | Temp 98.1°F | Resp 16 | Ht 66.0 in | Wt 197.0 lb

## 2014-07-25 DIAGNOSIS — M5137 Other intervertebral disc degeneration, lumbosacral region: Secondary | ICD-10-CM

## 2014-07-25 DIAGNOSIS — M5134 Other intervertebral disc degeneration, thoracic region: Secondary | ICD-10-CM

## 2014-07-25 DIAGNOSIS — M5459 Other low back pain: Secondary | ICD-10-CM

## 2014-07-25 DIAGNOSIS — M546 Pain in thoracic spine: Secondary | ICD-10-CM | POA: Diagnosis present

## 2014-07-25 DIAGNOSIS — G588 Other specified mononeuropathies: Secondary | ICD-10-CM

## 2014-07-25 DIAGNOSIS — M545 Low back pain: Secondary | ICD-10-CM

## 2014-07-25 MED ORDER — BUPIVACAINE HCL (PF) 0.25 % IJ SOLN
INTRAMUSCULAR | Status: AC
  Administered 2014-07-25: 30 mL
  Filled 2014-07-25: qty 30

## 2014-07-25 MED ORDER — FENTANYL CITRATE (PF) 100 MCG/2ML IJ SOLN
INTRAMUSCULAR | Status: AC
Start: 2014-07-25 — End: 2014-07-25
  Administered 2014-07-25: 100 ug via INTRAVENOUS
  Filled 2014-07-25: qty 2

## 2014-07-25 MED ORDER — ORPHENADRINE CITRATE 30 MG/ML IJ SOLN
INTRAMUSCULAR | Status: AC
  Administered 2014-07-25: 60 mg
  Filled 2014-07-25: qty 2

## 2014-07-25 MED ORDER — TRIAMCINOLONE ACETONIDE 40 MG/ML IJ SUSP
INTRAMUSCULAR | Status: AC
  Administered 2014-07-25: 10 mg
  Filled 2014-07-25: qty 1

## 2014-07-25 MED ORDER — MIDAZOLAM HCL 5 MG/5ML IJ SOLN
INTRAMUSCULAR | Status: AC
  Administered 2014-07-25: 4 mg via INTRAVENOUS
  Filled 2014-07-25: qty 5

## 2014-07-25 NOTE — Telephone Encounter (Signed)
Orphenadrine citrate 100mg  ER tablet approved for non-formulary exception thru 01/28/2015 Medicare Part D benefit. Patient called  And informed of the Prior Authorization GPI/NDC :[id number] Patient ID # [id number]

## 2014-07-25 NOTE — Patient Instructions (Addendum)
Continue present medications. Ultram, Norflex and Voltaren gel   F/U PCP for evaliation of  BP and general medical  condition.  F/U surgical evaluation.  F/U neurological evaluation.  May consider radiofrequency rhizolysis or intraspinal procedures pending response to present treatment and F/U evaluation.  Patient to call [facility] Management Center should patient have concerns prior to scheduled return appointment.   Pain Management Discharge Instructions  General Discharge Instructions :  If you need to reach your doctor call: Monday-Friday 8:00 am - 4:00 pm at [phone] or toll free [phone].  After clinic hours [phone] to have operator reach doctor.  Bring all of your medication bottles to all your appointments in the pain clinic.  To cancel or reschedule your appointment with Pain Management please remember to call 24 hours in advance to avoid a fee.  Refer to the educational materials which you have been given on: General Risks, I had my Procedure. Discharge Instructions, Post Sedation.  Post Procedure Instructions:  The drugs you were given will stay in your system until tomorrow, so for the next 24 hours you should not drive, make any legal decisions or drink any alcoholic beverages.  You may eat anything you prefer, but it is better to start with liquids then soups and crackers, and gradually work up to solid foods.  Please notify your doctor immediately if you have any unusual bleeding, trouble breathing or pain that is not related to your normal pain.  Depending on the type of procedure that was done, some parts of your body may feel week and/or numb.  This usually clears up by tonight or the next day.  Walk with the use of an assistive device or accompanied by an adult for the 24 hours.  You may use ice on the affected area for the first 24 hours.  Put ice in a Ziploc bag and cover with a towel and place against area 15 minutes on 15 minutes off.  You may  switch to heat after 24 hours.

## 2014-07-25 NOTE — Progress Notes (Signed)
   Subjective:    Patient ID: [name], female    DOB: [birth year], 44 y.o.   MRN: [id number]  HPI  PROCEDURE PERFORMED:  Intercostal nerve block.  HISTORY OF PRESENT ILLNESS:  The patient is a 44 y.o. female who returns to the [facility] for further evaluation and treatment of pain involving the midportion of the back. There is concern regarding the patient's pain being due to significant component of intercostal neuralgia. The risks, benefits, and expectations of the procedure have been discussed and explained to the patient who was understanding and in agreement with suggested treatment plan. We will proceed with interventional treatment as discussed.   DESCRIPTION OF PROCEDURE: Intercostal nerve block with IV Versed, IV fentanyl conscious sedation, EKG, blood pressure, pulse, and pulse oximetry monitoring. The procedure was performed with the patient in the prone position under fluoroscopic guidance.   Intercostal nerve block, right side: With the patient in the prone position, Betadine prep of proposed entry site was performed under fluoroscopic guidance with AP view of the thoracic spine. Under fluoroscopic guidance, a 22 -gauge needle was inserted to contact bone of the 11th rib on the right side after which the needle was repositioned at the inferior border of the 11th rib on the right side under fluoroscopic guidance. Following documentation of needle placement, at the inferior border of the 11th rib on the right side and negative aspiration, a total of 3 mL of 0.25% bupivacaine with Kenalog was injected for right side for 11th rib intercostal nerve block.   INTERCOSTAL NERVE BLOCKS AT T10, T9, T8, T7, AND T6 LEVELS: The procedure was performed at these levels as was performed at the previous level, T11, utilizing the same technique  and under fluoroscopic guidance  A total of 10 mg Kenalog was utilized for the procedure.   PLAN:   1. Medications: We will continue  presently prescribed medications. Norflex and Ultram and Voltaren gel 2. The patient is to follow up with primary care physician for further evaluation of blood pressure and general medical condition as discussed. 3. Surgical evaluation.  4. Neurological evaluation. 5. May consider the patient for additional studies pending response to treatment and follow-up evaluation. 6. May consider radiofrequency procedures, implantation type procedures and other treatment pending response to treatment and follow-up evaluation. 7. The patient has been advised to adhere to proper body mechanics and to call the Pain Management Center prior to scheduled return appointment should there be significant change in condition or have other concerns regarding condition prior to scheduled return appointment.  The patient is understanding and in agreement with suggested treatment plan.      Review of Systems     Objective:   Physical Exam        Assessment & Plan:

## 2014-07-25 NOTE — Progress Notes (Signed)
Safety precautions to be maintained throughout the outpatient stay will include: orient to surroundings, keep bed in low position, maintain call bell within reach at all times, provide assistance with transfer out of bed and ambulation.  

## 2014-07-26 ENCOUNTER — Telehealth: Payer: Self-pay | Admitting: *Deleted

## 2014-07-26 NOTE — Telephone Encounter (Signed)
Patient denies any complications post procedure 

## 2014-08-02 ENCOUNTER — Ambulatory Visit: Payer: BLUE CROSS/BLUE SHIELD | Admitting: Licensed Clinical Social Worker

## 2014-08-09 ENCOUNTER — Telehealth (HOSPITAL_COMMUNITY): Payer: Self-pay

## 2014-08-09 ENCOUNTER — Other Ambulatory Visit: Payer: Self-pay | Admitting: *Deleted

## 2014-08-09 DIAGNOSIS — F988 Other specified behavioral and emotional disorders with onset usually occurring in childhood and adolescence: Secondary | ICD-10-CM

## 2014-08-09 MED ORDER — DULOXETINE HCL 30 MG PO CPEP: 30.0000 mg | ORAL_CAPSULE | Freq: Two times a day (BID) | ORAL | Status: DC

## 2014-08-09 MED ORDER — LISDEXAMFETAMINE DIMESYLATE 30 MG PO CAPS: 30.0000 mg | ORAL_CAPSULE | Freq: Every day | ORAL | Status: DC

## 2014-08-09 NOTE — Telephone Encounter (Signed)
Telephone call with patient to inform her prescription for Vyvanse was prepared for pick up after Dr. Salem Senate approved refill and signed order.

## 2014-08-09 NOTE — Telephone Encounter (Signed)
Telephone message left by patient requesting a refill of her prescribed Vyvanse - last to be filled after 07/08/14 and does not return to see Dr. Adele Schilder until 09/07/14.

## 2014-08-10 ENCOUNTER — Ambulatory Visit: Payer: BLUE CROSS/BLUE SHIELD | Admitting: Family Medicine

## 2014-08-12 ENCOUNTER — Telehealth (HOSPITAL_COMMUNITY): Payer: Self-pay

## 2014-08-12 ENCOUNTER — Ambulatory Visit (INDEPENDENT_AMBULATORY_CARE_PROVIDER_SITE_OTHER): Payer: Commercial Indemnity | Admitting: Licensed Clinical Social Worker

## 2014-08-12 DIAGNOSIS — F988 Other specified behavioral and emotional disorders with onset usually occurring in childhood and adolescence: Secondary | ICD-10-CM

## 2014-08-12 DIAGNOSIS — F909 Attention-deficit hyperactivity disorder, unspecified type: Secondary | ICD-10-CM

## 2014-08-12 DIAGNOSIS — F319 Bipolar disorder, unspecified: Secondary | ICD-10-CM | POA: Diagnosis not present

## 2014-08-12 DIAGNOSIS — F411 Generalized anxiety disorder: Secondary | ICD-10-CM

## 2014-08-12 NOTE — Telephone Encounter (Signed)
[name], sister picked up prescription on [birth year]  [place] [phone]  dlo

## 2014-08-12 NOTE — Progress Notes (Signed)
   THERAPIST PROGRESS NOTE  Session Time: 11:15 a.m. - 12:15 p.m.  Participation Level: Active  Behavioral Response: CasualAlertAngry, Anxious and Depressed  Type of Therapy: Individual Therapy  Treatment Goals addressed: Coping  Interventions: Solution Focused, Strength-based, Supportive and Family Systems  Summary: [name] is a 44 y.o. female who presents with diagnosis of Bi-Polar Disorder, MRE, Depressed.  She was appropriately tearful on and off throughout session. Client distracted at times yet able to return to previous thought. No evidence of mania, psychosis or SI and thoughts were logical and coherent.  New stressor is that her mother fell and broke her femur, was hospitalized then sent to rehab where the mother refused to stay beyond 24 hours which resulted in her being discharged from the rehab facility back to client's home.  Client talked about various complications related to getting mother's DME in the home and setting up home health care, all of which client essentially set up after making numerous calls.  Rated her physical pain as typically as a 5-6.  Only goes down to about a three when she has nerve block but will have to skip this treatment since she is the sole care-giver for her mother now.  Appropriate frustration and anger was voiced regarding lack of help and lack of offers to help her care for her mother.  Client shared more family history/family dynamics with LCSW that includes a strained relationship between client and her siblings, primarily due to their substance abuse/addictive behaviors and a history of them taking pain pills from client.  Given client's mother's dependency on her now, she will have to postpone pursuing contact with [facility].  Also, with an increase in anxiety and depression, Eboney told LCSW "I don't think I could work right now. I can't stand to be around people."  She admits to not paying as much attention to personal  hygiene and concentration and focus along with energy and interest remain low.  She did not discuss relationship with spouse during session.  Lashell was receptive to support provided and recommendations to read hand-outs for discussion at next session.  Suicidal/Homicidal: Negativewithout intent/plan  Therapist Response:   LCSW offered client much reassurance along with active and reflective listening to continue establishing trust and rapport.  Supportive psychotherapy with insight was provided to client along with much emotional support and encouragement.  Normalized feelings voiced in relation to current psychosocial stressors and life experiences especially in relation to chronic pain. Normalized the stress and difficulties of client's current situation and commended client for opening up about to let go of these intense negative experiences. Recommended she look at hand-outs provided on stress management and thinking patterns and offered for she and LCSW to talk about a personalized plan of how she can cope with stress and build in self-care.  Encouraged calls between sessions PRN.  Plan: Return again in two weeks.  Joliyah will keep appointments and read therapy hand-outs and establish therapy goals for she and LCSW to monitor between sessions.  Diagnosis: BiPolar I, MRE, Depressed   ADHD   Generalized anxiety disorder   Miguel Dibble, LCSW 08/12/2014

## 2014-08-16 ENCOUNTER — Ambulatory Visit: Payer: Self-pay | Admitting: Oncology

## 2014-08-16 ENCOUNTER — Other Ambulatory Visit: Payer: Self-pay

## 2014-08-17 ENCOUNTER — Encounter: Payer: Self-pay | Admitting: Family Medicine

## 2014-08-17 ENCOUNTER — Ambulatory Visit (INDEPENDENT_AMBULATORY_CARE_PROVIDER_SITE_OTHER): Payer: BLUE CROSS/BLUE SHIELD | Admitting: Family Medicine

## 2014-08-17 ENCOUNTER — Encounter: Payer: Self-pay | Admitting: Internal Medicine

## 2014-08-17 ENCOUNTER — Other Ambulatory Visit (INDEPENDENT_AMBULATORY_CARE_PROVIDER_SITE_OTHER): Payer: BLUE CROSS/BLUE SHIELD

## 2014-08-17 VITALS — BP 132/82 | HR 124 | Wt 197.0 lb

## 2014-08-17 DIAGNOSIS — R739 Hyperglycemia, unspecified: Secondary | ICD-10-CM

## 2014-08-17 DIAGNOSIS — D751 Secondary polycythemia: Secondary | ICD-10-CM | POA: Diagnosis not present

## 2014-08-17 DIAGNOSIS — S8390XD Sprain of unspecified site of unspecified knee, subsequent encounter: Secondary | ICD-10-CM | POA: Diagnosis not present

## 2014-08-17 DIAGNOSIS — M9420 Chondromalacia, unspecified site: Secondary | ICD-10-CM | POA: Diagnosis not present

## 2014-08-17 LAB — BASIC METABOLIC PANEL
BUN: 10 mg/dL (ref 6–23)
CHLORIDE: 101 meq/L (ref 96–112)
CO2: 26 mEq/L (ref 19–32)
Calcium: 9.5 mg/dL (ref 8.4–10.5)
Creatinine, Ser: 0.69 mg/dL (ref 0.40–1.20)
GFR: 99.3 mL/min (ref >60.00)
GLUCOSE: 92 mg/dL (ref 70–99)
Potassium: 4.2 mEq/L (ref 3.5–5.1)
Sodium: 137 mEq/L (ref 135–145)

## 2014-08-17 LAB — CBC WITH DIFFERENTIAL/PLATELET
BASOS ABS: 0 10*3/uL (ref 0.0–0.1)
BASOS PCT: 0.5 % (ref 0.0–3.0)
EOS ABS: 0.3 10*3/uL (ref 0.0–0.7)
EOS PCT: 3 % (ref 0.0–5.0)
HEMATOCRIT: 49.1 % — AB (ref 36.0–46.0)
Hemoglobin: 16.9 g/dL — ABNORMAL HIGH (ref 12.0–15.0)
Lymphocytes Relative: 30.1 % (ref 12.0–46.0)
Lymphs Abs: 2.7 10*3/uL (ref 0.7–4.0)
MCHC: 34.3 g/dL (ref 30.0–36.0)
MCV: 91.2 fl (ref 78.0–100.0)
MONO ABS: 1.1 10*3/uL — AB (ref 0.1–1.0)
Monocytes Relative: 12.2 % — ABNORMAL HIGH (ref 3.0–12.0)
NEUTROS PCT: 54.2 % (ref 43.0–77.0)
Neutro Abs: 4.9 10*3/uL (ref 1.4–7.7)
Platelets: 251 10*3/uL (ref 150.0–400.0)
RBC: 5.39 Mil/uL — AB (ref 3.87–5.11)
RDW: 12.6 % (ref 11.5–15.5)
WBC: 9.1 10*3/uL (ref 4.0–10.5)

## 2014-08-17 LAB — HEMOGLOBIN A1C: Hgb A1c MFr Bld: 5.5 % (ref 4.6–6.5)

## 2014-08-17 NOTE — Patient Instructions (Addendum)
Good to see you Tried an injection today Continue all the other medicines, exercises and icing.  Conitnue the pennsaid See me again in 4 weeks.

## 2014-08-17 NOTE — Assessment & Plan Note (Signed)
Injections in the knees bilaterally today. Patient tolerated the procedure well. We discussed icing regimen and home exercises. Patient will continue to do the conservative therapy at this time. Patient come back in 4 weeks for further evaluation. At that time if continuing have pain she could be a candidate for viscous supplementation.

## 2014-08-17 NOTE — Progress Notes (Signed)
[name], [place] [address] Phone: [phone] Subjective:    I'm seeing this patient by the request  of:  Walker Kehr, MD   CC:  Left knee pain  [id number] [name] is a 44 y.o. female coming in with complaint of left knee pain. Patient states that the knee pain is mostly bilateral. Has noticed though more on the left knee. Patient has had the pain seemed to be worsening over the course last several weeks. Patient had so severe that he she did discuss this with her pain management physician. He diagnosed it as more of a post traumatic arthritis. Patient had a x-ray which was unremarkable and an MRI of the left knee was ordered on June 16. MRI was reviewed by me and showed 10 mm area of grade 4 chondromalacia of the lateral facet of the patella.  She was seen previously and was given home exercises, bracing, as well as we discussed an icing regimen. Patient states she is only made about a 5% improvement. Continues to have pain especially when she crouches down. Has been doing the exercises regularly as well as wearing the brace. Patient says that her contralateral side is hurting her almost as much. States that it is starting affect some of her daily activities.  Past Medical History  Diagnosis Date  . Attention deficit disorder without mention of hyperactivity   . Anxiety state, unspecified   . Pain in joint, site unspecified   . Unspecified asthma(493.90)   . Migraine without aura, without mention of intractable migraine without mention of status migrainosus   . Depressive disorder, not elsewhere classified   . Unspecified essential hypertension   . Unspecified vitamin D deficiency   . Hypertension   . Esophageal reflux    Past Surgical History  Procedure Laterality Date  . Partial hysterectomy  2007  . Tonsillectomy  04/29/00  . Incise and drain abcess  2011    Right axilla- Dr. Barkley Bruns  . Abdominal ultrasound   10/12/97  . Plantar fascia surgery  2000  . Transthoracic echocardiogram  11/2009    EF=>55%; trace MR & TR  . Nm myocar perf wall motion  11/2009    bruce myoview; perfusion defect in anterior region consistent with breast attenuation; remaining myocardium with normal perfusion; post-stress EF 74%; low risk scan   . Appendectomy  05/27/2013    gangrenous  . Abdominal hysterectomy    . Tonsillectomy Bilateral    History  Substance Use Topics  . Smoking status: Current Every Day Smoker -- 1.00 packs/day for 27 years    Types: Cigarettes  . Smokeless tobacco: Never Used  . Alcohol Use: No   Allergies  Allergen Reactions  . Amoxicillin Itching    REACTION: QUESTIONABLE  . Doxycycline Nausea And Vomiting    REACTION: vomiting  . Hydrochlorothiazide     REACTION: CRAMPS AT HIGHER DOSAGES  . Lisinopril     Other reaction(s): Cough (finding) REACTION: COUGH  . Penicillins   . Percocet [Oxycodone-Acetaminophen] Itching    itching  . Talwin [Pentazocine] Nausea And Vomiting    n/v   Family History  Problem Relation Age of Onset  . Asthma      Family history  . Coronary artery disease      1st degree female < 50  . Diabetes      1st degree relative  . Breast cancer Mother   . Multiple sclerosis Mother   .  Cancer Mother 53    breast ca  . Depression Mother   . Lupus Sister     PTSD  . Alcohol abuse Sister   . Bipolar disorder Sister   . Alcohol abuse Brother     also HTN, lupus, enlarged heart  . Heart disease Father     cardiac arrest  . Heart disease Maternal Grandfather     cardiac arrest  . COPD Paternal Grandmother   . Diabetes Paternal Grandfather   . Heart disease Paternal Grandfather   . ADD / ADHD Child   . Asthma Child        Past medical history, social, surgical and family history all reviewed in electronic medical record.   Review of Systems: No headache, visual changes, nausea, vomiting, diarrhea, constipation, dizziness, abdominal pain, skin rash,  fevers, chills, night sweats, weight loss, swollen lymph nodes, body aches, joint swelling, muscle aches, chest pain, shortness of breath, mood changes.   Objective Blood pressure 132/82, pulse 124, weight 197 lb (89.359 kg), SpO2 97 %.  General: No apparent distress alert and oriented x3 mood and affect normal, dressed appropriately.  HEENT: Pupils equal, extraocular movements intact  Respiratory: Patient's speak in full sentences and does not appear short of breath  Cardiovascular: No lower extremity edema, non tender, no erythema  Skin: Warm dry intact with no signs of infection or rash on extremities or on axial skeleton.  Abdomen: Soft nontender  Neuro: Cranial nerves II through XII are intact, neurovascularly intact in all extremities with 2+ DTRs and 2+ pulses.  Lymph: No lymphadenopathy of posterior or anterior cervical chain or axillae bilaterally.  Gait normal with good balance and coordination.  MSK:  Non tender with full range of motion and good stability and symmetric strength and tone of shoulders, elbows, wrist, hip, and ankles bilaterally.  Knee: Left Normal to inspection with no erythema or effusion or obvious bony abnormalities. Patient shows the patient is minimally tender to palpation over the superior lateral patella ROM full in flexion and extension and lower leg rotation. Ligaments with solid consistent endpoints including ACL, PCL, LCL, MCL. Negative Mcmurray's, Apley's, and Thessalonian tests. painful patellar compression. Patellar glide with moderate crepitus. J lateral tracking noted Patellar and quadriceps tendons unremarkable. Hamstring and quadriceps strength is normal.  Right knee also has pain with patellar compression  After informed written and verbal consent, patient was seated on exam table. Right knee was prepped with alcohol swab and utilizing anterolateral approach, patient's right knee space was injected with 4:1  marcaine 0.5%: Kenalog 40mg /dL.  Patient tolerated the procedure well without immediate complications.  After informed written and verbal consent, patient was seated on exam table. Left knee was prepped with alcohol swab and utilizing anterolateral approach, patient's left knee space was injected with 4:1  marcaine 0.5%: Kenalog 40mg /dL. Patient tolerated the procedure well without immediate complications.      Impression and Recommendations:     This case required medical decision making of moderate complexity.

## 2014-08-18 ENCOUNTER — Encounter: Payer: Self-pay | Admitting: Pain Medicine

## 2014-08-18 ENCOUNTER — Ambulatory Visit: Payer: BLUE CROSS/BLUE SHIELD | Attending: Pain Medicine | Admitting: Pain Medicine

## 2014-08-18 VITALS — BP 103/89 | HR 94 | Temp 98.6°F | Resp 18 | Ht 66.0 in | Wt 197.0 lb

## 2014-08-18 DIAGNOSIS — M5134 Other intervertebral disc degeneration, thoracic region: Secondary | ICD-10-CM | POA: Insufficient documentation

## 2014-08-18 DIAGNOSIS — M62838 Other muscle spasm: Secondary | ICD-10-CM | POA: Diagnosis not present

## 2014-08-18 DIAGNOSIS — M5136 Other intervertebral disc degeneration, lumbar region: Secondary | ICD-10-CM | POA: Insufficient documentation

## 2014-08-18 DIAGNOSIS — M546 Pain in thoracic spine: Secondary | ICD-10-CM | POA: Diagnosis present

## 2014-08-18 DIAGNOSIS — M170 Bilateral primary osteoarthritis of knee: Secondary | ICD-10-CM | POA: Insufficient documentation

## 2014-08-18 DIAGNOSIS — M545 Low back pain: Secondary | ICD-10-CM

## 2014-08-18 DIAGNOSIS — M5137 Other intervertebral disc degeneration, lumbosacral region: Secondary | ICD-10-CM

## 2014-08-18 DIAGNOSIS — M47816 Spondylosis without myelopathy or radiculopathy, lumbar region: Secondary | ICD-10-CM | POA: Insufficient documentation

## 2014-08-18 DIAGNOSIS — M5459 Other low back pain: Secondary | ICD-10-CM

## 2014-08-18 DIAGNOSIS — Z7982 Long term (current) use of aspirin: Secondary | ICD-10-CM

## 2014-08-18 DIAGNOSIS — G588 Other specified mononeuropathies: Secondary | ICD-10-CM | POA: Insufficient documentation

## 2014-08-18 MED ORDER — DICLOFENAC SODIUM 1 % TD GEL: TRANSDERMAL | Status: DC

## 2014-08-18 MED ORDER — TRAMADOL HCL 50 MG PO TABS: ORAL_TABLET | ORAL | Status: DC

## 2014-08-18 MED ORDER — ORPHENADRINE CITRATE ER 100 MG PO TB12: ORAL_TABLET | ORAL | Status: DC

## 2014-08-18 NOTE — Patient Instructions (Addendum)
Continue present medications Norflex, Voltaren gel, and Ultram  Thoracic epidural steroid injection to be performed at time of return appointment in approximately 2-3 weeks as we discussed  F/U PCP Dr. Alain Marion for evaliation of  BP and general medical  condition.  F/U surgical evaluation of knee with Dr. Tamala Julian as discussed   F/U neurological evaluation  May consider radiofrequency rhizolysis or intraspinal procedures pending response to present treatment and F/U evaluation.  Patient to call [facility] should patient have concerns prior to scheduled return appointment. Epidural Steroid Injection Patient Information  Description: The epidural space surrounds the nerves as they exit the spinal cord.  In some patients, the nerves can be compressed and inflamed by a bulging disc or a tight spinal canal (spinal stenosis).  By injecting steroids into the epidural space, we can bring irritated nerves into direct contact with a potentially helpful medication.  These steroids act directly on the irritated nerves and can reduce swelling and inflammation which often leads to decreased pain.  Epidural steroids may be injected anywhere along the spine and from the neck to the low back depending upon the location of your pain.   After numbing the skin with local anesthetic (like Novocaine), a small needle is passed into the epidural space slowly.  You may experience a sensation of pressure while this is being done.  The entire block usually last less than 10 minutes.  Conditions which may be treated by epidural steroids:   Low back and leg pain  Neck and arm pain  Spinal stenosis  Post-laminectomy syndrome  Herpes zoster (shingles) pain  Pain from compression fractures  Preparation for the injection:  1. Do not eat any solid food or dairy products within 6 hours of your appointment.  2. You may drink clear liquids up to 2 hours before appointment.  Clear liquids include water,  black coffee, juice or soda.  No milk or cream please. 3. You may take your regular medication, including pain medications, with a sip of water before your appointment  Diabetics should hold regular insulin (if taken separately) and take 1/2 normal NPH dos the morning of the procedure.  Carry some sugar containing items with you to your appointment. 4. A driver must accompany you and be prepared to drive you home after your procedure.  5. Bring all your current medications with your. 6. An IV may be inserted and sedation may be given at the discretion of the physician.   7. A blood pressure cuff, EKG and other monitors will often be applied during the procedure.  Some patients may need to have extra oxygen administered for a short period. 8. You will be asked to provide medical information, including your allergies, prior to the procedure.  We must know immediately if you are taking blood thinners (like Coumadin/Warfarin)  Or if you are allergic to IV iodine contrast (dye). We must know if you could possible be pregnant.  Possible side-effects:  Bleeding from needle site  Infection (rare, may require surgery)  Nerve injury (rare)  Numbness & tingling (temporary)  Difficulty urinating (rare, temporary)  Spinal headache ( a headache worse with upright posture)  Light -headedness (temporary)  Pain at injection site (several days)  Decreased blood pressure (temporary)  Weakness in arm/leg (temporary)  Pressure sensation in back/neck (temporary)  Call if you experience:  Fever/chills associated with headache or increased back/neck pain.  Headache worsened by an upright position.  New onset weakness or numbness of an extremity below  the injection site  Hives or difficulty breathing (go to the emergency room)  Inflammation or drainage at the infection site  Severe back/neck pain  Any new symptoms which are concerning to you  Please note:  Although the local anesthetic  injected can often make your back or neck feel good for several hours after the injection, the pain will likely return.  It takes 3-7 days for steroids to work in the epidural space.  You may not notice any pain relief for at least that one week.  If effective, we will often do a series of three injections spaced 3-6 weeks apart to maximally decrease your pain.  After the initial series, we generally will wait several months before considering a repeat injection of the same type.  If you have any questions, please call [phone] [place]  What are the risk, side effects and possible complications? Generally speaking, most procedures are safe.  However, with any procedure there are risks, side effects, and the possibility of complications.  The risks and complications are dependent upon the sites that are lesioned, or the type of nerve block to be performed.  The closer the procedure is to the spine, the more serious the risks are.  Great care is taken when placing the radio frequency needles, block needles or lesioning probes, but sometimes complications can occur. 1. Infection: Any time there is an injection through the skin, there is a risk of infection.  This is why sterile conditions are used for these blocks.  There are four possible types of infection. 1. Localized skin infection. 2. Central Nervous System Infection-This can be in the form of Meningitis, which can be deadly. 3. Epidural Infections-This can be in the form of an epidural abscess, which can cause pressure inside of the spine, causing compression of the spinal cord with subsequent paralysis. This would require an emergency surgery to decompress, and there are no guarantees that the patient would recover from the paralysis. 4. Discitis-This is an infection of the intervertebral discs.  It occurs in about 1% of discography procedures.  It is difficult to treat and  it may lead to surgery.        2. Pain: the needles have to go through skin and soft tissues, will cause soreness.       3. Damage to internal structures:  The nerves to be lesioned may be near blood vessels or    other nerves which can be potentially damaged.       4. Bleeding: Bleeding is more common if the patient is taking blood thinners such as  aspirin, Coumadin, Ticiid, Plavix, etc., or if he/she have some genetic predisposition  such as hemophilia. Bleeding into the spinal canal can cause compression of the spinal  cord with subsequent paralysis.  This would require an emergency surgery to  decompress and there are no guarantees that the patient would recover from the  paralysis.       5. Pneumothorax:  Puncturing of a lung is a possibility, every time a needle is introduced in  the area of the chest or upper back.  Pneumothorax refers to free air around the  collapsed lung(s), inside of the thoracic cavity (chest cavity).  Another two possible  complications related to a similar event would include: Hemothorax and Chylothorax.   These are variations of the Pneumothorax, where instead of air around the collapsed  lung(s), you may have blood or chyle, respectively.  6. Spinal headaches: They may occur with any procedures in the area of the spine.       7. Persistent CSF (Cerebro-Spinal Fluid) leakage: This is a rare problem, but may occur  with prolonged intrathecal or epidural catheters either due to the formation of a fistulous  track or a dural tear.       8. Nerve damage: By working so close to the spinal cord, there is always a possibility of  nerve damage, which could be as serious as a permanent spinal cord injury with  paralysis.       9. Death:  Although rare, severe deadly allergic reactions known as "Anaphylactic  reaction" can occur to any of the medications used.      10. Worsening of the symptoms:  We can always make thing worse.  What are the chances of something like this  happening? Chances of any of this occuring are extremely low.  By statistics, you have more of a chance of getting killed in a motor vehicle accident: while driving to the hospital than any of the above occurring .  Nevertheless, you should be aware that they are possibilities.  In general, it is similar to taking a shower.  Everybody knows that you can slip, hit your head and get killed.  Does that mean that you should not shower again?  Nevertheless always keep in mind that statistics do not mean anything if you happen to be on the wrong side of them.  Even if a procedure has a 1 (one) in a 1,000,000 (million) chance of going wrong, it you happen to be that one..Also, keep in mind that by statistics, you have more of a chance of having something go wrong when taking medications.  Who should not have this procedure? If you are on a blood thinning medication (e.g. Coumadin, Plavix, see list of "Blood Thinners"), or if you have an active infection going on, you should not have the procedure.  If you are taking any blood thinners, please inform your physician.  How should I prepare for this procedure?  Do not eat or drink anything at least six hours prior to the procedure.  Bring a driver with you .  It cannot be a taxi.  Come accompanied by an adult that can drive you back, and that is strong enough to help you if your legs get weak or numb from the local anesthetic.  Take all of your medicines the morning of the procedure with just enough water to swallow them.  If you have diabetes, make sure that you are scheduled to have your procedure done first thing in the morning, whenever possible.  If you have diabetes, take only half of your insulin dose and notify our nurse that you have done so as soon as you arrive at the clinic.  If you are diabetic, but only take blood sugar pills (oral hypoglycemic), then do not take them on the morning of your procedure.  You may take them after you have had the  procedure.  Do not take aspirin or any aspirin-containing medications, at least eleven (11) days prior to the procedure.  They may prolong bleeding.  Wear loose fitting clothing that may be easy to take off and that you would not mind if it got stained with Betadine or blood.  Do not wear any jewelry or perfume  Remove any nail coloring.  It will interfere with some of our monitoring equipment.  NOTE: Remember that this is  not meant to be interpreted as a complete list of all possible complications.  Unforeseen problems may occur.  BLOOD THINNERS The following drugs contain aspirin or other products, which can cause increased bleeding during surgery and should not be taken for 2 weeks prior to and 1 week after surgery.  If you should need take something for relief of minor pain, you may take acetaminophen which is found in Tylenol,m Datril, Anacin-3 and Panadol. It is not blood thinner. The products listed below are.  Do not take any of the products listed below in addition to any listed on your instruction sheet.  A.P.C or A.P.C with Codeine Codeine Phosphate Capsules #3 Ibuprofen Ridaura  ABC compound Congesprin Imuran rimadil  Advil Cope Indocin Robaxisal  Alka-Seltzer Effervescent Pain Reliever and Antacid Coricidin or Coricidin-D  Indomethacin Rufen  Alka-Seltzer plus Cold Medicine Cosprin Ketoprofen S-A-C Tablets  Anacin Analgesic Tablets or Capsules Coumadin Korlgesic Salflex  Anacin Extra Strength Analgesic tablets or capsules CP-2 Tablets Lanoril Salicylate  Anaprox Cuprimine Capsules Levenox Salocol  Anexsia-D Dalteparin Magan Salsalate  Anodynos Darvon compound Magnesium Salicylate Sine-off  Ansaid Dasin Capsules Magsal Sodium Salicylate  Anturane Depen Capsules Marnal Soma  APF Arthritis pain formula Dewitt's Pills Measurin Stanback  Argesic Dia-Gesic Meclofenamic Sulfinpyrazone  Arthritis Bayer Timed Release Aspirin Diclofenac Meclomen Sulindac  Arthritis pain formula  Anacin Dicumarol Medipren Supac  Analgesic (Safety coated) Arthralgen Diffunasal Mefanamic Suprofen  Arthritis Strength Bufferin Dihydrocodeine Mepro Compound Suprol  Arthropan liquid Dopirydamole Methcarbomol with Aspirin Synalgos  ASA tablets/Enseals Disalcid Micrainin Tagament  Ascriptin Doan's Midol Talwin  Ascriptin A/D Dolene Mobidin Tanderil  Ascriptin Extra Strength Dolobid Moblgesic Ticlid  Ascriptin with Codeine Doloprin or Doloprin with Codeine Momentum Tolectin  Asperbuf Duoprin Mono-gesic Trendar  Aspergum Duradyne Motrin or Motrin IB Triminicin  Aspirin plain, buffered or enteric coated Durasal Myochrisine Trigesic  Aspirin Suppositories Easprin Nalfon Trillsate  Aspirin with Codeine Ecotrin Regular or Extra Strength Naprosyn Uracel  Atromid-S Efficin Naproxen Ursinus  Auranofin Capsules Elmiron Neocylate Vanquish  Axotal Emagrin Norgesic Verin  Azathioprine Empirin or Empirin with Codeine Normiflo Vitamin E  Azolid Emprazil Nuprin Voltaren  Bayer Aspirin plain, buffered or children's or timed BC Tablets or powders Encaprin Orgaran Warfarin Sodium  Buff-a-Comp Enoxaparin Orudis Zorpin  Buff-a-Comp with Codeine Equegesic Os-Cal-Gesic   Buffaprin Excedrin plain, buffered or Extra Strength Oxalid   Bufferin Arthritis Strength Feldene Oxphenbutazone   Bufferin plain or Extra Strength Feldene Capsules Oxycodone with Aspirin   Bufferin with Codeine Fenoprofen Fenoprofen Pabalate or Pabalate-SF   Buffets II Flogesic Panagesic   Buffinol plain or Extra Strength Florinal or Florinal with Codeine Panwarfarin   Buf-Tabs Flurbiprofen Penicillamine   Butalbital Compound Four-way cold tablets Penicillin   Butazolidin Fragmin Pepto-Bismol   Carbenicillin Geminisyn Percodan   Carna Arthritis Reliever Geopen Persantine   Carprofen Gold's salt Persistin   Chloramphenicol Goody's Phenylbutazone   Chloromycetin Haltrain Piroxlcam   Clmetidine heparin Plaquenil   Cllnoril Hyco-pap  Ponstel   Clofibrate Hydroxy chloroquine Propoxyphen         Before stopping any of these medications, be sure to consult the physician who ordered them.  Some, such as Coumadin (Warfarin) are ordered to prevent or treat serious conditions such as "deep thrombosis", "pumonary embolisms", and other heart problems.  The amount of time that you may need off of the medication may also vary with the medication and the reason for which you were taking it.  If you are taking any of these medications, please make sure  you notify your pain physician before you undergo any procedures.

## 2014-08-18 NOTE — Progress Notes (Signed)
Subjective:    Patient ID: [name], female    DOB: 1970/08/01, 43 y.o.   MRN: [id number]  HPI  Patient is 44 year old female returns to [facility] for further evaluation and treatment of pain involving the upper mid and lower back and lower extremity regions. Patient states that her pain involves the region of the mid and lower back regions with the mid back region of pain being the most significant pain at this time. Patient admits to significant muscle spasms in the mid back region and states that prior interventional treatment as well as medications including Norflex has helped decrease some of the severe pain and spasms of the mid back region. We discussed patient's condition and will consider patient for interventional treatment consisting of thoracic epidural steroid injection at time of return appointment and will continue medications as prescribed. Patient was understanding and in agreement status treatment plan.    Review of Systems     Objective:   Physical Exam there was tenderness to palpation of the splenius capitis and occipitalis musculature region palpation which reproduces moderate discomfort. There was moderate tenderness of the cervical facet cervical paraspinal musculature region. There was severe tenderness to palpation of the thoracic facet thoracic region and the thoracic paraspinal musculature regions. No crepitus of the thoracic region was noted. Patient appeared to be with bilaterally equal grip strength. Tinel's and Phalen's maneuver were without increase of pain of significant degree. Palpation over the region of the acromioclavicular glenohumeral joint region was without increase of pain of significant degree. Palpation over the thoracic facet thoracic and thoracic paraspinal musculature regions reproduced the most significant component of patient's pain. Palpation over the lumbar paraspinal musculature region lumbar facet region was tends to  palpation of mild to moderate degree. Lateral bending and rotation and extension and palpation of the lumbar facets reproduce mild to moderate discomfort. There was mild to moderate tenderness over the PSIS PII S region gluteal and piriformis musculature regions.. Palpation of the greater trochanteric region iliotibial band region was with mild discomfort. Palpation of the knee was a tends to palpation in crepitus of the knee. There was increased pain with range of motion maneuvers of the knee especially the left knee. There appeared to be decreased EHL strength with negative anterior and posterior drawer signs of the knee. No definite sensory deficit of dermatomal distribution of the extremities were noted. There was negative clonus negative Homans. Abdomen nontender with no costovertebral angle tenderness noted.      Assessment & Plan:  Degenerative disc disease of the thoracic spine Degenerative changes thoracic spine is with most significant involvement T8-T9 with chronic endplate changes and height loss of the vertebral bodies of the mid and lower thoracic spine region, posterior disc bulging seen in the lower thoracic region.  Thoracic facet syndrome  Intercostal neuralgia with severe muscle spasms  Degenerative disc disease lumbar spine Degenerative changes L3-4, L4-5, and L5-S1, degenerative changes  Sacroiliac joint dysfunction  Degenerative joint disease of knees   Plan  Continue present medications Norflex, Ultram, and Voltaren gel  Thoracic epidural steroid injection to be performed at time of return appointment  F/U PCP Dr.Plotnikov  for evaliation of  BP and general medical  condition.  F/U surgical evaluation. Patient will undergo follow-up neurosurgical evaluation of pain of the thoracic and lumbar regions and lower extremity region as discussed  F/U surgical evaluation of knee with Dr. Tamala Julian as planned  F/U neurological evaluation  May consider radiofrequency  rhizolysis  or intraspinal procedures pending response to present treatment and F/U evaluation.  Patient to call [facility] should patient have concerns prior to scheduled return appointment.

## 2014-08-18 NOTE — Progress Notes (Signed)
Safety precautions to be maintained throughout the outpatient stay will include: orient to surroundings, keep bed in low position, maintain call bell within reach at all times, provide assistance with transfer out of bed and ambulation.  

## 2014-08-19 ENCOUNTER — Telehealth: Payer: Self-pay | Admitting: Internal Medicine

## 2014-08-19 ENCOUNTER — Other Ambulatory Visit: Payer: BLUE CROSS/BLUE SHIELD

## 2014-08-19 DIAGNOSIS — D751 Secondary polycythemia: Secondary | ICD-10-CM

## 2014-08-19 NOTE — Telephone Encounter (Signed)
Patient has been advised that phlebotomy order has been placed for the [facility]--patient given the choice to either come to [facility] or donate [facility]--patient stated she wanted to come to [facility]--dr plotnikov gave verbal order to place phlebotomy order for [facility]--not urgent for it to be done today per dr plotnikov--patient stated she will come to [facility] on Monday 08/22/14--

## 2014-08-19 NOTE — Telephone Encounter (Signed)
Patient states she was talking to Dr. Alain Marion and he told her that she needed to have a phlebotomy done.  She will be going to the lab on Monday.  She wants to make sure there are orders for her to be able to have this done at our lab.

## 2014-08-22 ENCOUNTER — Telehealth: Payer: Self-pay | Admitting: Oncology

## 2014-08-22 ENCOUNTER — Telehealth: Payer: Self-pay | Admitting: *Deleted

## 2014-08-22 ENCOUNTER — Encounter: Payer: Self-pay | Admitting: Neurology

## 2014-08-22 ENCOUNTER — Ambulatory Visit (INDEPENDENT_AMBULATORY_CARE_PROVIDER_SITE_OTHER): Payer: BLUE CROSS/BLUE SHIELD | Admitting: Neurology

## 2014-08-22 VITALS — BP 139/90 | HR 110 | Ht 66.0 in | Wt 193.0 lb

## 2014-08-22 DIAGNOSIS — G43709 Chronic migraine without aura, not intractable, without status migrainosus: Secondary | ICD-10-CM | POA: Diagnosis not present

## 2014-08-22 MED ORDER — ELETRIPTAN HYDROBROMIDE 40 MG PO TABS: 40.0000 mg | ORAL_TABLET | ORAL | Status: DC | PRN

## 2014-08-22 NOTE — Telephone Encounter (Signed)
Left message to confirm appointment 08/09. Mailed calendar.

## 2014-08-22 NOTE — Progress Notes (Signed)
Chief Complaint  Patient presents with  . Migraine    Reports getting two migraines each month lasting 2-3 days each.  She typically uses Excedrin Migraine which really does not help.  She is also taking nortriptyline 30mg  at bedtime.      PATIENT: [name] DOB: October 05, 1970  HISTORICAL  [name]  44  years old  right-handed Caucasian female, referred to her optometrist Dr. Truman Hayward for evaluation of frequent headaches  She had a past medical history of bipolar disorder, is on polypharmacy treatment, was treated with lithium in the past, she is now on polypharmacy treatment  She was evaluated by optometrist Dr. Truman Hayward in February 18 2013, for her yearly eye evaluation, normal ocular examination, intraocular pressure 16 mm mercury in the right eye, 17 in the left eye, healthy discs, normal visual field testing, she reported frequent headaches, was referred to our clinic for evaluation  She reported migraine all her life, getting worse since 2012, bilateral retro-orbital area severe pounding headaches was associated light noise sensitivity, nauseous, lasting for hours to 3 days,  She was given nortriptyline 20 mg every night as migraine prevention, which has helped her 50%, she reported less headaches, less severe, it also helps her sleep, no significant side effect,  She tried Imitrex in the past, does not help her headaches, she is taking diclofenac, and the phenergan as needed for her headaches  Trigger for her headache, stress, sleep deprivations she is having headaches one to twice a month  UPDATE August 22 2014: Last clinical visit was February 2015, she continue have migraines, couple times each month, triggers for migraine are bright light, stress, each headaches last about 2-3 days, severe pounding headaches with associated light noise sensitivity, nauseous, she has tried Maxalt, which does ease up her headaches, but does not take it away completely, previous Toradol shots,  Fioricet was helpful too  Imitrex was not helpful  REVIEW OF SYSTEMS: Full 14 system review of systems performed and notable only for chill, excessive sweating, excessive thirst, insomnia, incontinence of bladder, frequent urination, joint pain, low back pain, headaches, agitation, depression, anxiety.  ALLERGIES: Allergies  Allergen Reactions  . Amoxicillin Itching    REACTION: QUESTIONABLE  . Doxycycline Nausea And Vomiting    REACTION: vomiting  . Hydrochlorothiazide     REACTION: CRAMPS AT HIGHER DOSAGES  . Lisinopril     Other reaction(s): Cough (finding) REACTION: COUGH  . Penicillins   . Percocet [Oxycodone-Acetaminophen] Itching    itching  . Talwin [Pentazocine] Nausea And Vomiting    n/v    HOME MEDICATIONS: Current Outpatient Prescriptions on File Prior to Visit  Medication Sig Dispense Refill  . albuterol (PROAIR HFA) 108 (90 BASE) MCG/ACT inhaler Inhale 2 puffs into the lungs every 4 (four) hours as needed. For shortness of breath.  1 Inhaler  5  . ARIPiprazole (ABILIFY) 20 MG tablet Take one tablet by mouth one time daily  30 tablet  1  . benztropine (COGENTIN) 1 MG tablet Take 1 tablet (1 mg total) by mouth at bedtime.  30 tablet  1  . diazepam (VALIUM) 5 MG tablet Take 1 tablet (5 mg total) by mouth 3 (three) times daily as needed for anxiety.  90 tablet  1  . diclofenac (VOLTAREN) 75 MG EC tablet TAKE ONE TABLET BY MOUTH TWICE DAILY AS NEEDED FOR PAIN   20 tablet  1  . DULoxetine (CYMBALTA) 30 MG capsule Take two capsules (60mg ) by mouth daily      .  gabapentin (NEURONTIN) 300 MG capsule TAKE ONE CAPSULE BY MOUTH THREE TIMES DAILY  90 capsule  2  . hydrOXYzine (VISTARIL) 50 MG capsule Take 1 capsule (50 mg total) by mouth at bedtime.  30 capsule  1  . lisdexamfetamine (VYVANSE) 30 MG capsule Take 1 capsule (30 mg total) by mouth every morning.  30 capsule  0  . metoprolol tartrate (LOPRESSOR) 25 MG tablet Take 1 tablet (25 mg total) by mouth 2 (two) times daily.   180 tablet  3  . nortriptyline (PAMELOR) 25 MG capsule take 1 capsule by mouth at bedtime  30 capsule  5  . omeprazole (PRILOSEC) 20 MG capsule Take 1 capsule (20 mg total) by mouth daily.  30 capsule  11  . promethazine (PHENERGAN) 12.5 MG tablet TAKE ONE TABLET BY MOUTH EVERY SIX HOURS AS NEEDED FOR NAUSEA  30 tablet  0  . solifenacin (VESICARE) 10 MG tablet Take 0.5 tablets (5 mg total) by mouth daily.  30 tablet  5  . traMADol (ULTRAM) 50 MG tablet Take 1-2 tablets (50-100 mg total) by mouth 2 (two) times daily as needed for pain.  100 tablet  1  . triamcinolone ointment (KENALOG) 0.1 % Apply 1 application topically 2 (two) times daily.  30 g  3     PAST MEDICAL HISTORY: Past Medical History  Diagnosis Date  . Attention deficit disorder without mention of hyperactivity   . Anxiety state, unspecified   . Pain in joint, site unspecified   . Unspecified asthma(493.90)   . Migraine without aura, without mention of intractable migraine without mention of status migrainosus   . Depressive disorder, not elsewhere classified   . Unspecified essential hypertension   . Unspecified vitamin D deficiency   . Hypertension   . Esophageal reflux     PAST SURGICAL HISTORY: Past Surgical History  Procedure Laterality Date  . Partial hysterectomy  2007  . Tonsillectomy  04/29/00  . Incise and drain abcess  2011    Right axilla- Dr. Barkley Bruns  . Abdominal ultrasound  10/12/97  . Plantar fascia surgery  2000  . Transthoracic echocardiogram  11/2009    EF=>55%; trace MR & TR  . Nm myocar perf wall motion  11/2009    bruce myoview; perfusion defect in anterior region consistent with breast attenuation; remaining myocardium with normal perfusion; post-stress EF 74%; low risk scan   . Appendectomy  05/27/2013    gangrenous  . Abdominal hysterectomy    . Tonsillectomy Bilateral     FAMILY HISTORY: Family History  Problem Relation Age of Onset  . Asthma      Family history  . Coronary artery  disease      1st degree female < 50  . Diabetes      1st degree relative  . Breast cancer Mother   . Multiple sclerosis Mother   . Cancer Mother 55    breast ca  . Depression Mother   . Lupus Sister     PTSD  . Alcohol abuse Sister   . Bipolar disorder Sister   . Alcohol abuse Brother     also HTN, lupus, enlarged heart  . Heart disease Father     cardiac arrest  . Heart disease Maternal Grandfather     cardiac arrest  . COPD Paternal Grandmother   . Diabetes Paternal Grandfather   . Heart disease Paternal Grandfather   . ADD / ADHD Child   . Asthma Child     SOCIAL  HISTORY:  History   Social History  . Marital Status: Married    Spouse Name: N/A  . Number of Children: 2  . Years of Education: N/A   Occupational History  . 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 Smoking status: Current Every Day Smoker -- 1.00 packs/day for 27 years    Types: Cigarettes  . Smokeless tobacco: Never Used  . Alcohol Use: No  . Drug Use: No  . Sexual Activity: Yes   Other Topics Concern  . Not on file   Social History Narrative   Patient lives at home with her mother.   Disabled   Right handed   Caffeine three cups daily   Some college education     PHYSICAL EXAM   Filed Vitals:   08/22/14 1338  BP: 139/90  Pulse: 110  Height: 5\' 6"  (1.676 m)  Weight: 193 lb (87.544 kg)    Not recorded      Body mass index is 31.17 kg/(m^2).  PHYSICAL EXAMNIATION:  Gen: NAD, conversant, well nourised, obese, well groomed                     Cardiovascular: Regular rate rhythm, no peripheral edema, warm, nontender. Eyes: Conjunctivae clear without exudates or hemorrhage Neck: Supple, no carotid bruise. Pulmonary: Clear to auscultation bilaterally   NEUROLOGICAL EXAM:  MENTAL STATUS: Speech:    Speech is normal; fluent and spontaneous with normal comprehension.  Cognition:    The patient is  oriented to person, place, and time;     recent and remote memory intact;     language fluent;     normal attention, concentration,     fund of knowledge.  CRANIAL NERVES: CN II: Visual fields are full to confrontation. Fundoscopic exam is normal with sharp discs and no vascular changes. Pupils were equal round reactive to light CN III, IV, VI: extraocular movement are normal. No ptosis. CN V: Facial sensation is intact to pinprick in all 3 divisions bilaterally. Corneal responses are intact.  CN VII: Face is symmetric with normal eye closure and smile. CN VIII: Hearing is normal to rubbing fingers CN IX, X: Palate elevates symmetrically. Phonation is normal. CN XI: Head turning and shoulder shrug are intact CN XII: Tongue is midline with normal movements and no atrophy.  MOTOR: There is no pronator drift of out-stretched arms. Muscle bulk and tone are normal. Muscle strength is normal.  REFLEXES: Reflexes are 2+ and symmetric at the biceps, triceps, knees, and ankles. Plantar responses are flexor.  SENSORY: Light touch, pinprick, position sense, and vibration sense are intact in fingers and toes.  COORDINATION: Rapid alternating movements and fine finger movements are intact. There is no dysmetria on finger-to-nose and heel-knee-shin. There are no abnormal or extraneous movements.   GAIT/STANCE: Posture is normal. Gait is steady with normal steps, base, arm swing, and turning. Heel and toe walking are normal. Tandem gait is normal.  Romberg is absent.   DIAGNOSTIC DATA (LABS, IMAGING, TESTING) - I reviewed patient records, labs, notes, testing and imaging myself where available.  Lab Results  Component Value Date   WBC 9.1 08/17/2014   HGB 16.9* 08/17/2014   HCT 49.1* 08/17/2014   MCV 91.2 08/17/2014   PLT 251.0 08/17/2014      Component Value Date/Time   NA 137 08/17/2014 0818   NA 136 06/02/2013 1435  K 4.2 08/17/2014 0818   K 4.2 06/02/2013 1435   CL 101  08/17/2014 0818   CL 104 06/02/2013 1435   CO2 26 08/17/2014 0818   CO2 27 06/02/2013 1435   GLUCOSE 92 08/17/2014 0818   GLUCOSE 115* 06/02/2013 1435   BUN 10 08/17/2014 0818   BUN 8 06/02/2013 1435   CREATININE 0.69 08/17/2014 0818   CREATININE 0.81 06/02/2013 1435   CREATININE 0.65 04/28/2010 1346   CALCIUM 9.5 08/17/2014 0818   CALCIUM 9.1 06/02/2013 1435   PROT 8.5* 03/16/2014 0814   PROT 6.7 05/27/2013 1007   ALBUMIN 4.9 03/16/2014 0814   ALBUMIN 3.4 05/27/2013 1007   AST 14 03/16/2014 0814   AST 18 05/27/2013 1007   ALT 19 03/16/2014 0814   ALT 29 05/27/2013 1007   ALKPHOS 141* 03/16/2014 0814   ALKPHOS 100 05/27/2013 1007   BILITOT 0.3 03/16/2014 0814   BILITOT 0.5 05/27/2013 1007   GFRNONAA >60 06/02/2013 1435   GFRNONAA >90 09/20/2011 1424   GFRAA >60 06/02/2013 1435   GFRAA >90 09/20/2011 1424   Lab Results  Component Value Date   CHOL 225* 10/01/2013   HDL 41.10 10/01/2013   LDLCALC 164* 10/01/2013   LDLDIRECT 161.1 02/18/2013   TRIG 98.0 10/01/2013   CHOLHDL 5 10/01/2013   Lab Results  Component Value Date   HGBA1C 5.5 08/17/2014   Lab Results  Component Value Date   VITAMINB12 530 04/02/2012   Lab Results  Component Value Date   TSH 1.83 10/01/2013      ASSESSMENT AND PLAN  REEVA DAVERN is a 44 y.o. female with past medical history of migraines, now presenting with frequent headaches,  1 Keep Nortriptyline 30mg  daily 2. Relpax prn 3. RTC with NP in 3 months  Marcial Pacas, M.D. Ph.D.  [facility] [address], [place], [place] [address] [phone]

## 2014-08-22 NOTE — Telephone Encounter (Signed)
"  I called last week to cancel the appointment on 08-16-2014.  It's listed as a 'No Show'.  My mom had surgery is why I called to cancel.I need to be rescheduled.  Please call [phone]."

## 2014-08-23 ENCOUNTER — Encounter: Payer: Self-pay | Admitting: Internal Medicine

## 2014-08-23 NOTE — Progress Notes (Signed)
Nurses Please obtain platelet function assay results today

## 2014-08-24 ENCOUNTER — Encounter: Payer: Self-pay | Admitting: Neurology

## 2014-08-24 ENCOUNTER — Other Ambulatory Visit: Payer: BLUE CROSS/BLUE SHIELD

## 2014-08-24 ENCOUNTER — Telehealth: Payer: Self-pay | Admitting: *Deleted

## 2014-08-24 DIAGNOSIS — D751 Secondary polycythemia: Secondary | ICD-10-CM

## 2014-08-24 NOTE — Telephone Encounter (Signed)
I contacted ins and provided clinical info.  The request is currently under review Ref # [id number]

## 2014-08-24 NOTE — Telephone Encounter (Signed)
Pt called to let us know Relpax needs a prior authorization.

## 2014-08-25 ENCOUNTER — Ambulatory Visit (INDEPENDENT_AMBULATORY_CARE_PROVIDER_SITE_OTHER): Payer: Commercial Indemnity | Admitting: Licensed Clinical Social Worker

## 2014-08-25 ENCOUNTER — Other Ambulatory Visit: Payer: Self-pay | Admitting: Internal Medicine

## 2014-08-25 DIAGNOSIS — F319 Bipolar disorder, unspecified: Secondary | ICD-10-CM | POA: Diagnosis not present

## 2014-08-25 DIAGNOSIS — F909 Attention-deficit hyperactivity disorder, unspecified type: Secondary | ICD-10-CM

## 2014-08-25 DIAGNOSIS — F411 Generalized anxiety disorder: Secondary | ICD-10-CM

## 2014-08-25 DIAGNOSIS — F988 Other specified behavioral and emotional disorders with onset usually occurring in childhood and adolescence: Secondary | ICD-10-CM

## 2014-08-25 MED ORDER — NICOTINE 7 MG/24HR TD PT24: 7.0000 mg | MEDICATED_PATCH | Freq: Every day | TRANSDERMAL | Status: DC

## 2014-08-25 MED ORDER — NICOTINE 21 MG/24HR TD PT24: 21.0000 mg | MEDICATED_PATCH | Freq: Every day | TRANSDERMAL | Status: DC

## 2014-08-25 MED ORDER — NICOTINE 14 MG/24HR TD PT24: 14.0000 mg | MEDICATED_PATCH | Freq: Every day | TRANSDERMAL | Status: DC
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
## 2014-08-25 NOTE — Telephone Encounter (Signed)
[employer] has approved the request for coverage on Relpax effective until 01/28/2015 Ref # [id number]

## 2014-08-29 ENCOUNTER — Other Ambulatory Visit
Admission: RE | Admit: 2014-08-29 | Discharge: 2014-08-29 | Disposition: A | Discharge status: Home / Self Care | Payer: BLUE CROSS/BLUE SHIELD | Source: Ambulatory Visit | Attending: Pain Medicine | Admitting: Pain Medicine

## 2014-08-29 ENCOUNTER — Other Ambulatory Visit: Payer: Self-pay | Admitting: *Deleted

## 2014-08-29 DIAGNOSIS — M545 Low back pain: Secondary | ICD-10-CM | POA: Diagnosis present

## 2014-08-29 LAB — PROTIME-INR
INR: 1.05
Prothrombin Time: 13.9 seconds (ref 11.4–15.0)

## 2014-08-29 LAB — APTT: aPTT: 29 seconds (ref 24–36)

## 2014-08-29 MED ORDER — LINACLOTIDE 145 MCG PO CAPS: 145.0000 ug | ORAL_CAPSULE | Freq: Every day | ORAL | Status: DC | PRN

## 2014-08-29 NOTE — Telephone Encounter (Signed)
Called patient and informed her that it is okay to do the procedure this week; labs done and Dr Primus Bravo gave the okay for procedure to be done as scheduled.

## 2014-08-29 NOTE — Progress Notes (Signed)
Patient called and informed the procedure can be done this week according to Dr Ethel Rana note.

## 2014-08-31 ENCOUNTER — Encounter: Payer: Self-pay | Admitting: Pain Medicine

## 2014-08-31 ENCOUNTER — Ambulatory Visit: Payer: BLUE CROSS/BLUE SHIELD | Attending: Pain Medicine | Admitting: Pain Medicine

## 2014-08-31 VITALS — BP 108/61 | HR 82 | Temp 98.1°F | Resp 16 | Ht 66.0 in | Wt 196.0 lb

## 2014-08-31 DIAGNOSIS — M5134 Other intervertebral disc degeneration, thoracic region: Secondary | ICD-10-CM | POA: Diagnosis not present

## 2014-08-31 DIAGNOSIS — G588 Other specified mononeuropathies: Secondary | ICD-10-CM

## 2014-08-31 DIAGNOSIS — M546 Pain in thoracic spine: Secondary | ICD-10-CM | POA: Diagnosis present

## 2014-08-31 DIAGNOSIS — M5124 Other intervertebral disc displacement, thoracic region: Secondary | ICD-10-CM | POA: Diagnosis not present

## 2014-08-31 DIAGNOSIS — M170 Bilateral primary osteoarthritis of knee: Secondary | ICD-10-CM

## 2014-08-31 DIAGNOSIS — M47894 Other spondylosis, thoracic region: Secondary | ICD-10-CM | POA: Insufficient documentation

## 2014-08-31 DIAGNOSIS — M5459 Other low back pain: Secondary | ICD-10-CM

## 2014-08-31 DIAGNOSIS — M545 Low back pain: Secondary | ICD-10-CM | POA: Diagnosis present

## 2014-08-31 DIAGNOSIS — M5137 Other intervertebral disc degeneration, lumbosacral region: Secondary | ICD-10-CM

## 2014-08-31 DIAGNOSIS — M47814 Spondylosis without myelopathy or radiculopathy, thoracic region: Secondary | ICD-10-CM

## 2014-08-31 MED ORDER — BUPIVACAINE HCL (PF) 0.25 % IJ SOLN
INTRAMUSCULAR | Status: AC
  Administered 2014-08-31: 5 mL
  Filled 2014-08-31: qty 30

## 2014-08-31 MED ORDER — LIDOCAINE HCL (PF) 1 % IJ SOLN
INTRAMUSCULAR | Status: AC
Start: 2014-08-31 — End: 2014-08-31
  Administered 2014-08-31: 5 mL
  Filled 2014-08-31: qty 5

## 2014-08-31 MED ORDER — FENTANYL CITRATE (PF) 100 MCG/2ML IJ SOLN
INTRAMUSCULAR | Status: AC
  Administered 2014-08-31: 100 ug via INTRAVENOUS
  Filled 2014-08-31: qty 2

## 2014-08-31 MED ORDER — SODIUM CHLORIDE 0.9 % IJ SOLN
INTRAMUSCULAR | Status: AC
  Administered 2014-08-31: 15 mL
  Filled 2014-08-31: qty 20

## 2014-08-31 MED ORDER — TRIAMCINOLONE ACETONIDE 40 MG/ML IJ SUSP
INTRAMUSCULAR | Status: AC
  Administered 2014-08-31: 40 mg
  Filled 2014-08-31: qty 1

## 2014-08-31 MED ORDER — ORPHENADRINE CITRATE 30 MG/ML IJ SOLN
INTRAMUSCULAR | Status: AC
  Administered 2014-08-31: 60 mg
  Filled 2014-08-31: qty 2

## 2014-08-31 MED ORDER — MIDAZOLAM HCL 5 MG/5ML IJ SOLN
INTRAMUSCULAR | Status: AC
  Administered 2014-08-31: 3 mg via INTRAVENOUS
  Filled 2014-08-31: qty 5

## 2014-08-31 NOTE — Patient Instructions (Addendum)
Continue present medication  F/U PCP Dr Alain Marion for evaliation of  BP and general medical  condition  F/U surgical evaluation  F/U neurological evaluation  May consider radiofrequency rhizolysis or intraspinal procedures pending response to present treatment and F/U evaluation   Patient to call Pain Management Center should patient have concerns prior to scheduled return appointmen. Pain Management Discharge Instructions  General Discharge Instructions :  If you need to reach your doctor call: Monday-Friday 8:00 am - 4:00 pm at [phone] or toll free [phone].  After clinic hours [phone] to have operator reach doctor.  Bring all of your medication bottles to all your appointments in the pain clinic.  To cancel or reschedule your appointment with Pain Management please remember to call 24 hours in advance to avoid a fee.  Refer to the educational materials which you have been given on: General Risks, I had my Procedure. Discharge Instructions, Post Sedation.  Post Procedure Instructions:  The drugs you were given will stay in your system until tomorrow, so for the next 24 hours you should not drive, make any legal decisions or drink any alcoholic beverages.  You may eat anything you prefer, but it is better to start with liquids then soups and crackers, and gradually work up to solid foods.  Please notify your doctor immediately if you have any unusual bleeding, trouble breathing or pain that is not related to your normal pain.  Depending on the type of procedure that was done, some parts of your body may feel week and/or numb.  This usually clears up by tonight or the next day.  Walk with the use of an assistive device or accompanied by an adult for the 24 hours.  You may use ice on the affected area for the first 24 hours.  Put ice in a Ziploc bag and cover with a towel and place against area 15 minutes on 15 minutes off.  You may switch to heat after 24  hours.Epidural Steroid Injection Patient Information  Description: The epidural space surrounds the nerves as they exit the spinal cord.  In some patients, the nerves can be compressed and inflamed by a bulging disc or a tight spinal canal (spinal stenosis).  By injecting steroids into the epidural space, we can bring irritated nerves into direct contact with a potentially helpful medication.  These steroids act directly on the irritated nerves and can reduce swelling and inflammation which often leads to decreased pain.  Epidural steroids may be injected anywhere along the spine and from the neck to the low back depending upon the location of your pain.   After numbing the skin with local anesthetic (like Novocaine), a small needle is passed into the epidural space slowly.  You may experience a sensation of pressure while this is being done.  The entire block usually last less than 10 minutes.  Conditions which may be treated by epidural steroids:   Low back and leg pain  Neck and arm pain  Spinal stenosis  Post-laminectomy syndrome  Herpes zoster (shingles) pain  Pain from compression fractures  Preparation for the injection:  1. Do not eat any solid food or dairy products within 6 hours of your appointment.  2. You may drink clear liquids up to 2 hours before appointment.  Clear liquids include water, black coffee, juice or soda.  No milk or cream please. 3. You may take your regular medication, including pain medications, with a sip of water before your appointment  Diabetics should hold  regular insulin (if taken separately) and take 1/2 normal NPH dos the morning of the procedure.  Carry some sugar containing items with you to your appointment. 4. A driver must accompany you and be prepared to drive you home after your procedure.  5. Bring all your current medications with your. 6. An IV may be inserted and sedation may be given at the discretion of the physician.   7. A blood  pressure cuff, EKG and other monitors will often be applied during the procedure.  Some patients may need to have extra oxygen administered for a short period. 8. You will be asked to provide medical information, including your allergies, prior to the procedure.  We must know immediately if you are taking blood thinners (like Coumadin/Warfarin)  Or if you are allergic to IV iodine contrast (dye). We must know if you could possible be pregnant.  Possible side-effects:  Bleeding from needle site  Infection (rare, may require surgery)  Nerve injury (rare)  Numbness & tingling (temporary)  Difficulty urinating (rare, temporary)  Spinal headache ( a headache worse with upright posture)  Light -headedness (temporary)  Pain at injection site (several days)  Decreased blood pressure (temporary)  Weakness in arm/leg (temporary)  Pressure sensation in back/neck (temporary)  Call if you experience:  Fever/chills associated with headache or increased back/neck pain.  Headache worsened by an upright position.  New onset weakness or numbness of an extremity below the injection site  Hives or difficulty breathing (go to the emergency room)  Inflammation or drainage at the infection site  Severe back/neck pain  Any new symptoms which are concerning to you  Please note:  Although the local anesthetic injected can often make your back or neck feel good for several hours after the injection, the pain will likely return.  It takes 3-7 days for steroids to work in the epidural space.  You may not notice any pain relief for at least that one week.  If effective, we will often do a series of three injections spaced 3-6 weeks apart to maximally decrease your pain.  After the initial series, we generally will wait several months before considering a repeat injection of the same type.  If you have any questions, please call [phone] [facility]

## 2014-08-31 NOTE — Progress Notes (Signed)
   Subjective:    Patient ID: [name], female    DOB: 08-Feb-1970, 44 y.o.   MRN: [id number]  HPI  PROCEDURE PERFORMED:  Thoracic epidural steroid injection   NOTE: The patient is a 44 y.o. female who returns to [place] for further evaluation and treatment of pain involving the thoracic, lumbar, and lower extremity region. Prior thoracic MRI reveal patient to be with degenerative changes thoracic spine is with most significant involvement T8-T9 with chronic endplate changes and height loss of the vertebral bodies of the mid and lower thoracic spine region, posterior disc bulging seen in the lower thoracic region. There is concern regarding intraspinal abnormalities of the thoracic region contributing to patient's symptomatology of severe pain of the upper mediated and lower back regions The risks, benefits, and expectations of the procedure have been discussed and explained to the patient who was understanding and in agreement with suggested treatment plan. We will proceed with lumbar epidural steroid injection as discussed and as explained to the patient who is willing to proceed with procedure as planned.   DESCRIPTION OF PROCEDURE:  Thoracic Epidural Steroid Injection with IV Versed, IV fentanyl conscious sedation, EKG, blood pressure, pulse, and pulse oximetry monitoring. The procedure was performed with the patient in the prone position under fluoroscopic guidance. A local anesthetic skin wheal of 1.5% plain lidocaine was accomplished at proposed entry site. An 18-gauge Tuohy epidural needle was inserted at the  T-10 vertebral body level  right of the midline via loss-of-resistance technique with negative heme and negative CSF return. A total of 4 mL of Preservative-Free normal saline with 40 mg of Kenalog injected incrementally via epidurally placed needle. Needle was removed.    A total of 40 mg of Kenalog was utilized for the procedure.   The patient tolerated the  injection well.    PLAN:   1. Medications: We will continue presently prescribed medications. 2. Will consider modification of treatment regimen pending response to treatment rendered on today's visit and follow-up evaluation. 3. The patient is to follow-up with primary care physician   Dr.Plotnikov regarding blood pressure and general medical condition status post lumbar epidural steroid injection performed on today's visit. 4. Surgical evaluation. Neurosurgical reevaluation as planned 5. Neurological evaluation. 6. The patient may be a candidate for radiofrequency procedures, implantation device, and other treatment pending response to treatment and follow-up evaluation. 7. The patient has been advised to adhere to proper body mechanics and avoid activities which appear to aggravate condition. 8. The patient has been advised to [facility] prior to scheduled return appointment should there be significant change in condition or should there be sign  The patient is understanding and agrees with the suggested  treatment plan      Review of Systems     Objective:   Physical Exam        Assessment & Plan:

## 2014-09-01 ENCOUNTER — Other Ambulatory Visit: Payer: Self-pay | Admitting: Pain Medicine

## 2014-09-01 ENCOUNTER — Telehealth: Payer: Self-pay | Admitting: *Deleted

## 2014-09-01 NOTE — Telephone Encounter (Signed)
LVM

## 2014-09-06 ENCOUNTER — Ambulatory Visit (HOSPITAL_BASED_OUTPATIENT_CLINIC_OR_DEPARTMENT_OTHER): Payer: BLUE CROSS/BLUE SHIELD | Admitting: Oncology

## 2014-09-06 ENCOUNTER — Other Ambulatory Visit (HOSPITAL_BASED_OUTPATIENT_CLINIC_OR_DEPARTMENT_OTHER): Payer: BLUE CROSS/BLUE SHIELD

## 2014-09-06 VITALS — BP 118/95 | HR 107 | Temp 98.7°F | Resp 18 | Ht 66.0 in | Wt 198.9 lb

## 2014-09-06 DIAGNOSIS — D751 Secondary polycythemia: Secondary | ICD-10-CM

## 2014-09-06 LAB — CBC WITH DIFFERENTIAL/PLATELET
BASO%: 0.4 % (ref 0.0–2.0)
Basophils Absolute: 0.1 10*3/uL (ref 0.0–0.1)
EOS%: 1.2 % (ref 0.0–7.0)
Eosinophils Absolute: 0.1 10*3/uL (ref 0.0–0.5)
HCT: 40.2 % (ref 34.8–46.6)
HEMOGLOBIN: 14.1 g/dL (ref 11.6–15.9)
LYMPH#: 3 10*3/uL (ref 0.9–3.3)
LYMPH%: 25.4 % (ref 14.0–49.7)
MCH: 31.8 pg (ref 25.1–34.0)
MCHC: 35.1 g/dL (ref 31.5–36.0)
MCV: 90.5 fL (ref 79.5–101.0)
MONO#: 1.3 10*3/uL — AB (ref 0.1–0.9)
MONO%: 10.6 % (ref 0.0–14.0)
NEUT#: 7.4 10*3/uL — ABNORMAL HIGH (ref 1.5–6.5)
NEUT%: 62.4 % (ref 38.4–76.8)
PLATELETS: 252 10*3/uL (ref 145–400)
RBC: 4.44 10*6/uL (ref 3.70–5.45)
RDW: 13.2 % (ref 11.2–14.5)
WBC: 11.9 10*3/uL — ABNORMAL HIGH (ref 3.9–10.3)

## 2014-09-06 LAB — COMPREHENSIVE METABOLIC PANEL (CC13)
ALT: 18 U/L (ref 0–55)
AST: 15 U/L (ref 5–34)
Albumin: 3.9 g/dL (ref 3.5–5.0)
Alkaline Phosphatase: 118 U/L (ref 40–150)
Anion Gap: 9 mEq/L (ref 3–11)
BUN: 13.1 mg/dL (ref 7.0–26.0)
CHLORIDE: 107 meq/L (ref 98–109)
CO2: 25 meq/L (ref 22–29)
CREATININE: 0.8 mg/dL (ref 0.6–1.1)
Calcium: 9.1 mg/dL (ref 8.4–10.4)
EGFR: >90 mL/min/{1.73_m2} (ref >90)
GLUCOSE: 90 mg/dL (ref 70–140)
Potassium: 4.5 mEq/L (ref 3.5–5.1)
SODIUM: 141 meq/L (ref 136–145)
Total Bilirubin: <0.2 mg/dL (ref 0.20–1.20)
Total Protein: 7.1 g/dL (ref 6.4–8.3)

## 2014-09-06 NOTE — Progress Notes (Signed)
Hematology and Oncology Follow Up Visit  [name] [id number] 1970-09-14 44 y.o. 09/06/2014 3:27 PM Walker Kehr, MDPlotnikov, Evie Lacks, MD   Principle Diagnosis: 44 year old woman with polycythemia likely reactive in nature due to smoking and less likely related to her polycythemia vera. Her workup is currently ongoing.   Current therapy: She has been receiving intermittent phlebotomies with her primary care provider.  Interim History:  [name] presents today for a follow-up visit. She is a pleasant woman I saw in consultation in March 2016. Since the last visit, she'll been doing relatively well. She had periodic phlebotomies done at her primary care's office without any complications. She did not really feel any changes in her clinical status. She reports that she is still dizzy episodes which have subsided at this time. She does not report any headaches, blurry vision, syncope or seizures. She does not report any fevers or chills or sweats. He does not report any chest pain, palpitation orthopnea. He does not report any cough or hemoptysis. Does not report any bleeding or clotting tendencies. She does not report any skeletal complaints. Remaining review of systems unremarkable.  Medications: I have reviewed the patient's current medications.  Current Outpatient Prescriptions  Medication Sig Dispense Refill  . albuterol (PROAIR HFA) 108 (90 BASE) MCG/ACT inhaler Inhale 2 puffs into the lungs every 4 (four) hours as needed. For shortness of breath. 18 g 2  . albuterol (PROVENTIL) (2.5 MG/3ML) 0.083% nebulizer solution Take 3 mLs (2.5 mg total) by nebulization every 6 (six) hours as needed. For shortness of breath. 75 mL 0  . ARIPiprazole (ABILIFY) 30 MG tablet Take one tablet by mouth one time daily 30 tablet 2  . aspirin (BAYER ASPIRIN) 325 MG tablet Take 1 tablet (325 mg total) by mouth daily. 100 tablet 3  . benztropine (COGENTIN) 1 MG tablet Take 1 tablet (1 mg total) by mouth at  bedtime. 30 tablet 2  . budesonide-formoterol (SYMBICORT) 160-4.5 MCG/ACT inhaler Inhale 2 puffs into the lungs 2 (two) times daily. 1 Inhaler 2  . diazepam (VALIUM) 5 MG tablet Take one tab daily and 2 nd if needed 45 tablet 2  . diclofenac sodium (VOLTAREN) 1 % GEL Apply 2-4 mg to painful area of skin 4 times per day if tolerated 500 g 2  . DULoxetine (CYMBALTA) 30 MG capsule Take 1 capsule (30 mg total) by mouth 2 (two) times daily. 60 capsule 11  . eletriptan (RELPAX) 40 MG tablet Take 1 tablet (40 mg total) by mouth as needed for migraine or headache. May repeat in 2 hours if headache persists or recurs. 15 tablet 6  . Linaclotide (LINZESS) 145 MCG CAPS capsule Take 1-2 capsules (145-290 mcg total) by mouth daily as needed. 60 capsule 11  . lisdexamfetamine (VYVANSE) 30 MG capsule Take 1 capsule (30 mg total) by mouth daily. 30 capsule 0  . meloxicam (MOBIC) 15 MG tablet Take 1 tablet (15 mg total) by mouth daily as needed for pain. 30 tablet 5  . metoprolol (LOPRESSOR) 50 MG tablet Take 50 mg by mouth daily.    . nicotine (NICODERM CQ) 14 mg/24hr patch Place 1 patch (14 mg total) onto the skin daily. (Patient not taking: Reported on 08/31/2014) 28 patch 0  . nicotine (NICODERM CQ) 21 mg/24hr patch Place 1 patch (21 mg total) onto the skin daily. (Patient not taking: Reported on 08/31/2014) 28 patch 0  . nicotine (NICODERM CQ) 7 mg/24hr patch Place 1 patch (7 mg total) onto the  skin daily. (Patient not taking: Reported on 08/31/2014) 28 patch 0  . nortriptyline (PAMELOR) 10 MG capsule 3 tabs by mouth at bedtime 90 capsule 5  . orphenadrine (NORFLEX) 100 MG tablet Limit 1 tab by mouth per day or twice per day if tolerated 60 tablet 0  . pantoprazole (PROTONIX) 40 MG tablet Take 1 tablet (40 mg total) by mouth daily. 90 tablet 3  . pregabalin (LYRICA) 50 MG capsule Take 1 capsule (50 mg total) by mouth 3 (three) times daily. 21 capsule 0  . ranitidine (ZANTAC) 300 MG tablet Take 1 tablet (300 mg total)  by mouth at bedtime. 90 tablet 3  . traMADol (ULTRAM) 50 MG tablet Limit 1 tablet by mouth 3-6 times per day if tolerated 180 tablet 0  . Vitamin D, Ergocalciferol, (DRISDOL) 50000 UNITS CAPS capsule Take 1 capsule (50,000 Units total) by mouth every 7 (seven) days. 8 capsule 0   No current facility-administered medications for this visit.     Allergies:  Allergies  Allergen Reactions  . Amoxicillin Itching    REACTION: QUESTIONABLE  . Doxycycline Nausea And Vomiting    REACTION: vomiting  . Hydrochlorothiazide     REACTION: CRAMPS AT HIGHER DOSAGES  . Lisinopril     Other reaction(s): Cough (finding) REACTION: COUGH  . Penicillins   . Percocet [Oxycodone-Acetaminophen] Itching    itching  . Talwin [Pentazocine] Nausea And Vomiting    n/v    Past Medical History, Surgical history, Social history, and Family History were reviewed and updated.   Physical Exam: Blood pressure 118/95, pulse 107, temperature 98.7 F (37.1 C), temperature source Oral, resp. rate 18, height 5\' 6"  (1.676 m), weight 198 lb 14.4 oz (90.22 kg), SpO2 100 %. ECOG: 0 General appearance: alert and cooperative Head: Normocephalic, without obvious abnormality Neck: no adenopathy Lymph nodes: Cervical, supraclavicular, and axillary nodes normal. Heart:regular rate and rhythm, S1, S2 normal, no murmur, click, rub or gallop Lung:chest clear, no wheezing, rales, normal symmetric air entry Abdomin: soft, non-tender, without masses or organomegaly EXT:no erythema, induration, or nodules   Lab Results: Lab Results  Component Value Date   WBC 11.9* 09/06/2014   HGB 14.1 09/06/2014   HCT 40.2 09/06/2014   MCV 90.5 09/06/2014   PLT 252 09/06/2014     Chemistry      Component Value Date/Time   NA 137 08/17/2014 0818   NA 136 06/02/2013 1435   K 4.2 08/17/2014 0818   K 4.2 06/02/2013 1435   CL 101 08/17/2014 0818   CL 104 06/02/2013 1435   CO2 26 08/17/2014 0818   CO2 27 06/02/2013 1435   BUN 10  08/17/2014 0818   BUN 8 06/02/2013 1435   CREATININE 0.69 08/17/2014 0818   CREATININE 0.81 06/02/2013 1435   CREATININE 0.65 04/28/2010 1346      Component Value Date/Time   CALCIUM 9.5 08/17/2014 0818   CALCIUM 9.1 06/02/2013 1435   ALKPHOS 141* 03/16/2014 0814   ALKPHOS 100 05/27/2013 1007   AST 14 03/16/2014 0814   AST 18 05/27/2013 1007   ALT 19 03/16/2014 0814   ALT 29 05/27/2013 1007   BILITOT 0.3 03/16/2014 0814   BILITOT 0.5 05/27/2013 1007       Impression and Plan:   44 year old woman with the following issues:  1. Polycythemia: Her hemoglobin have normalized at this time after intermittent phlebotomies. The differential diagnosis was discussed again today which include a secondary causes such as smoking and sleep apnea. Primary causes  such as polycythemia vera is less likely but I am checking JAK2 mutation for completeness. I have recommended to hold off on any phlebotomy at this time unless her hemoglobin is above 18 in the future. I've advised her to lose weight and stopped smoking as the best measure at this time.  2. Thrombosis prophylaxis: Her risk of thrombosis is very low at this time and I agree with aspirin at this time.  3. Follow-up: As needed in the future.  Zola Button, MD 8/9/20163:27 PM

## 2014-09-07 ENCOUNTER — Encounter (HOSPITAL_COMMUNITY): Payer: Self-pay | Admitting: Psychiatry

## 2014-09-07 ENCOUNTER — Ambulatory Visit (INDEPENDENT_AMBULATORY_CARE_PROVIDER_SITE_OTHER): Payer: Commercial Indemnity | Admitting: Psychiatry

## 2014-09-07 ENCOUNTER — Ambulatory Visit (INDEPENDENT_AMBULATORY_CARE_PROVIDER_SITE_OTHER): Payer: Commercial Indemnity | Admitting: Licensed Clinical Social Worker

## 2014-09-07 ENCOUNTER — Other Ambulatory Visit (HOSPITAL_COMMUNITY): Payer: Self-pay | Admitting: Psychiatry

## 2014-09-07 VITALS — BP 134/90 | HR 93 | Ht 66.0 in | Wt 198.4 lb

## 2014-09-07 DIAGNOSIS — F988 Other specified behavioral and emotional disorders with onset usually occurring in childhood and adolescence: Secondary | ICD-10-CM

## 2014-09-07 DIAGNOSIS — F909 Attention-deficit hyperactivity disorder, unspecified type: Secondary | ICD-10-CM

## 2014-09-07 DIAGNOSIS — F411 Generalized anxiety disorder: Secondary | ICD-10-CM

## 2014-09-07 DIAGNOSIS — F419 Anxiety disorder, unspecified: Secondary | ICD-10-CM

## 2014-09-07 DIAGNOSIS — F319 Bipolar disorder, unspecified: Secondary | ICD-10-CM

## 2014-09-07 DIAGNOSIS — F3132 Bipolar disorder, current episode depressed, moderate: Secondary | ICD-10-CM

## 2014-09-07 MED ORDER — LISDEXAMFETAMINE DIMESYLATE 30 MG PO CAPS: 30.0000 mg | ORAL_CAPSULE | Freq: Every day | ORAL | Status: DC

## 2014-09-07 MED ORDER — DIAZEPAM 5 MG PO TABS: ORAL_TABLET | ORAL | Status: DC

## 2014-09-07 MED ORDER — BENZTROPINE MESYLATE 1 MG PO TABS: 1.0000 mg | ORAL_TABLET | Freq: Every day | ORAL | Status: DC

## 2014-09-07 MED ORDER — ARIPIPRAZOLE 30 MG PO TABS: ORAL_TABLET | ORAL | Status: DC

## 2014-09-07 NOTE — Progress Notes (Signed)
[facility] [phone] Progress Note  [name] [id number] 44 y.o.  09/07/2014 12:01 PM  Chief Complaint:  I have a lot of things going on in my life.  My sister is homeless.  My mother broke her leg.       History of Present Illness:  [name] came for her followup appointment.  She is taking her medication as prescribed.  Recently she has been complaining of increased psychosocial stressors.  Patient told her sister is homeless and she is taking care of her children.  .  She also endorsed that her mother broke her leg and she is involved in taking care of her .  Sometimes she gets overwhelmed .  She is seeing her physician for polycythemia vera and treatment on an ongoing basis.  She also started seeing therapist in [place] .  She is taking Cogentin, Abilify , Valium and Vyvanse.  She has noticed improvement in her symptoms but she still have a lot of psychosocial issues.  Sometime she gets hopeless and helpless but denies any active or passive suicidal parts or homicidal thought.  She is seeing a pain management on a regular basis.  Recently she has seen her pain physician.  She is taking a moderate amount of pain medication.  She is also on Pamelor and Cymbalta which is given by other provider.  Despite taking Pamelor, Cymbalta, Abilify, Valium and Lyrica she still have episodes of anxiety and depression.  Patient lives with her husband who is usually out of the town .  Patient denies drinking or using any illegal substances.    Suicidal Ideation: No Plan Formed: No Patient has means to carry out plan: No  Homicidal Ideation: No Plan Formed: No Patient has means to carry out plan: No  Past Psychiatric History/Hospitalization(s) Patient endorsed history of mood swings anger, irritability and ADD since childhood.  In the past she had tried Focalin, Vyvanse which worked very well until she lost her insurance.  She seen [name] for the management of bipolar disorder and ADD.   She did prescribe lithium which makes her careless, doxepin which helped her depression and quetiapine but patient did not see any improvement.  She also tried temazepam, Ambien which worked for only a short time.  She has given Valium with good response .  Patient denies any history of suicidal attempt but endorse suicidal ideation and stayed overnight in the psychiatric emergency room.  Patient denies any inpatient psychiatric treatment, hallucination, psychosis or mania.  However she endorse history of mood swing, irritability, Impulsive buying, spending money and panic attack .   Anxiety: Yes Bipolar Disorder: Yes  Depression: Yes Mania: Yes Psychosis: No Schizophrenia: No Personality Disorder: No Hospitalization for psychiatric illness: No History of Electroconvulsive Shock Therapy: No Prior Suicide Attempts: No  Medical History; Patient has multiple medical problems.  Her primary care physician is Dr. Lovena Gonzalez. She has hypertension, asthma, GERD, eczema, cystitis, vitamin D deficiency, chronic pain, chronic fatigue, urinary incontinence , migraine headaches, hyperlipidemia and narcolepsy.  Recently she had surgery for appendicitis  Review of Systems  Constitutional: Positive for malaise/fatigue.  Eyes: Negative for blurred vision.  Cardiovascular: Negative for chest pain and palpitations.  Gastrointestinal: Negative for nausea and abdominal pain.  Musculoskeletal: Positive for back pain.       Chronic pain  Skin: Negative for itching and rash.  Neurological: Positive for headaches. Negative for tingling and tremors.    Psychiatric: Agitation: No Hallucination: No Depressed Mood: No Insomnia: No  Hypersomnia: No Altered Concentration: No Feels Worthless: No Grandiose Ideas: No Belief In Special Powers: No New/Increased Substance Abuse: No Compulsions: No  Neurologic: Headache: Yes Seizure: No Paresthesias: No  Outpatient Encounter Prescriptions as of 09/07/2014   Medication Sig  . albuterol (PROAIR HFA) 108 (90 BASE) MCG/ACT inhaler Inhale 2 puffs into the lungs every 4 (four) hours as needed. For shortness of breath.  [name] albuterol (PROVENTIL) (2.5 MG/3ML) 0.083% nebulizer solution Take 3 mLs (2.5 mg total) by nebulization every 6 (six) hours as needed. For shortness of breath.  . ARIPiprazole (ABILIFY) 30 MG tablet Take one tablet by mouth one time daily  . aspirin (BAYER ASPIRIN) 325 MG tablet Take 1 tablet (325 mg total) by mouth daily.  . benztropine (COGENTIN) 1 MG tablet Take 1 tablet (1 mg total) by mouth at bedtime.  . budesonide-formoterol (SYMBICORT) 160-4.5 MCG/ACT inhaler Inhale 2 puffs into the lungs 2 (two) times daily.  . diazepam (VALIUM) 5 MG tablet Take one tab daily and 2 nd if needed  . diclofenac sodium (VOLTAREN) 1 % GEL Apply 2-4 mg to painful area of skin 4 times per day if tolerated  . DULoxetine (CYMBALTA) 30 MG capsule Take 1 capsule (30 mg total) by mouth 2 (two) times daily.  [name] eletriptan (RELPAX) 40 MG tablet Take 1 tablet (40 mg total) by mouth as needed for migraine or headache. May repeat in 2 hours if headache persists or recurs.  . Linaclotide (LINZESS) 145 MCG CAPS capsule Take 1-2 capsules (145-290 mcg total) by mouth daily as needed.  [name] lisdexamfetamine (VYVANSE) 30 MG capsule Take 1 capsule (30 mg total) by mouth daily.  . meloxicam (MOBIC) 15 MG tablet Take 1 tablet (15 mg total) by mouth daily as needed for pain.  . metoprolol (LOPRESSOR) 50 MG tablet Take 50 mg by mouth daily.  . nicotine (NICODERM CQ) 14 mg/24hr patch Place 1 patch (14 mg total) onto the skin daily. (Patient not taking: Reported on 08/31/2014)  . nicotine (NICODERM CQ) 21 mg/24hr patch Place 1 patch (21 mg total) onto the skin daily. (Patient not taking: Reported on 08/31/2014)  . nicotine (NICODERM CQ) 7 mg/24hr patch Place 1 patch (7 mg total) onto the skin daily. (Patient not taking: Reported on 08/31/2014)  . nortriptyline (PAMELOR) 10 MG capsule 3  tabs by mouth at bedtime  . orphenadrine (NORFLEX) 100 MG tablet Limit 1 tab by mouth per day or twice per day if tolerated  . pantoprazole (PROTONIX) 40 MG tablet Take 1 tablet (40 mg total) by mouth daily.  . pregabalin (LYRICA) 50 MG capsule Take 1 capsule (50 mg total) by mouth 3 (three) times daily.  . ranitidine (ZANTAC) 300 MG tablet Take 1 tablet (300 mg total) by mouth at bedtime.  . traMADol (ULTRAM) 50 MG tablet Limit 1 tablet by mouth 3-6 times per day if tolerated  . Vitamin D, Ergocalciferol, (DRISDOL) 50000 UNITS CAPS capsule Take 1 capsule (50,000 Units total) by mouth every 7 (seven) days.  . [DISCONTINUED] ARIPiprazole (ABILIFY) 30 MG tablet Take one tablet by mouth one time daily  . [DISCONTINUED] benztropine (COGENTIN) 1 MG tablet Take 1 tablet (1 mg total) by mouth at bedtime.  . [DISCONTINUED] diazepam (VALIUM) 5 MG tablet Take one tab daily and 2 nd if needed  . [DISCONTINUED] lisdexamfetamine (VYVANSE) 30 MG capsule Take 1 capsule (30 mg total) by mouth daily.  . [DISCONTINUED] lisdexamfetamine (VYVANSE) 30 MG capsule Take 1 capsule (30 mg total) by mouth daily.  . [  DISCONTINUED] lisdexamfetamine (VYVANSE) 30 MG capsule Take 1 capsule (30 mg total) by mouth daily.   No facility-administered encounter medications on file as of 09/07/2014.    Recent Results (from the past 2160 hour(s))  CBC with Differential/Platelet     Status: Abnormal   Collection Time: 06/30/14 11:05 AM  Result Value Ref Range   WBC 14.7 (H) 4.0 - 10.5 K/uL   RBC 4.69 3.87 - 5.11 Mil/uL   Hemoglobin 14.6 12.0 - 15.0 g/dL   HCT 44.0 36.0 - 46.0 %   MCV 93.8 78.0 - 100.0 fl   MCHC 33.3 30.0 - 36.0 g/dL   RDW 13.0 11.5 - 15.5 %   Platelets 244.0 150.0 - 400.0 K/uL   Neutrophils Relative % 64.0 43.0 - 77.0 %   Lymphocytes Relative 26.2 12.0 - 46.0 %   Monocytes Relative 8.5 3.0 - 12.0 %   Eosinophils Relative 1.0 0.0 - 5.0 %   Basophils Relative 0.3 0.0 - 3.0 %   Neutro Abs 9.4 (H) 1.4 - 7.7  K/uL   Lymphs Abs 3.9 0.7 - 4.0 K/uL   Monocytes Absolute 1.3 (H) 0.1 - 1.0 K/uL   Eosinophils Absolute 0.1 0.0 - 0.7 K/uL   Basophils Absolute 0.0 0.0 - 0.1 K/uL  Basic metabolic panel     Status: Abnormal   Collection Time: 06/30/14 11:05 AM  Result Value Ref Range   Sodium 137 135 - 145 mEq/L   Potassium 4.0 3.5 - 5.1 mEq/L   Chloride 103 96 - 112 mEq/L   CO2 25 19 - 32 mEq/L   Glucose, Bld 66 (L) 70 - 99 mg/dL   BUN 10 6 - 23 mg/dL   Creatinine, Ser 0.70 0.40 - 1.20 mg/dL   Calcium 9.1 8.4 - 10.5 mg/dL   GFR 97.73 >60.00 mL/min  Hemoglobin A1c     Status: None   Collection Time: 06/30/14 11:05 AM  Result Value Ref Range   Hgb A1c MFr Bld 5.2 4.6 - 6.5 %    Comment: Glycemic Control Guidelines for People with Diabetes:Non Diabetic:  <6%Goal of Therapy: <7%Additional Action Suggested:  >8%   CBC with Differential/Platelet     Status: Abnormal   Collection Time: 08/17/14  8:18 AM  Result Value Ref Range   WBC 9.1 4.0 - 10.5 K/uL   RBC 5.39 (H) 3.87 - 5.11 Mil/uL   Hemoglobin 16.9 (H) 12.0 - 15.0 g/dL   HCT 49.1 (H) 36.0 - 46.0 %   MCV 91.2 78.0 - 100.0 fl   MCHC 34.3 30.0 - 36.0 g/dL   RDW 12.6 11.5 - 15.5 %   Platelets 251.0 150.0 - 400.0 K/uL   Neutrophils Relative % 54.2 43.0 - 77.0 %   Lymphocytes Relative 30.1 12.0 - 46.0 %   Monocytes Relative 12.2 (H) 3.0 - 12.0 %   Eosinophils Relative 3.0 0.0 - 5.0 %   Basophils Relative 0.5 0.0 - 3.0 %   Neutro Abs 4.9 1.4 - 7.7 K/uL   Lymphs Abs 2.7 0.7 - 4.0 K/uL   Monocytes Absolute 1.1 (H) 0.1 - 1.0 K/uL   Eosinophils Absolute 0.3 0.0 - 0.7 K/uL   Basophils Absolute 0.0 0.0 - 0.1 K/uL  Basic metabolic panel     Status: None   Collection Time: 08/17/14  8:18 AM  Result Value Ref Range   Sodium 137 135 - 145 mEq/L   Potassium 4.2 3.5 - 5.1 mEq/L   Chloride 101 96 - 112 mEq/L   CO2 26  19 - 32 mEq/L   Glucose, Bld 92 70 - 99 mg/dL   BUN 10 6 - 23 mg/dL   Creatinine, Ser 0.69 0.40 - 1.20 mg/dL   Calcium 9.5 8.4 - 10.5  mg/dL   GFR 99.30 >60.00 mL/min  Hemoglobin A1c     Status: None   Collection Time: 08/17/14  8:18 AM  Result Value Ref Range   Hgb A1c MFr Bld 5.5 4.6 - 6.5 %    Comment: Glycemic Control Guidelines for People with Diabetes:Non Diabetic:  <6%Goal of Therapy: <7%Additional Action Suggested:  >8%   APTT     Status: None   Collection Time: 08/29/14 10:43 AM  Result Value Ref Range   aPTT 29 24 - 36 seconds  Protime-INR     Status: None   Collection Time: 08/29/14 10:43 AM  Result Value Ref Range   Prothrombin Time 13.9 11.4 - 15.0 seconds   INR 1.05   CBC with Differential/Platelet     Status: Abnormal   Collection Time: 09/06/14  3:06 PM  Result Value Ref Range   WBC 11.9 (H) 3.9 - 10.3 10e3/uL   NEUT# 7.4 (H) 1.5 - 6.5 10e3/uL   HGB 14.1 11.6 - 15.9 g/dL   HCT 40.2 34.8 - 46.6 %   Platelets 252 145 - 400 10e3/uL   MCV 90.5 79.5 - 101.0 fL   MCH 31.8 25.1 - 34.0 pg   MCHC 35.1 31.5 - 36.0 g/dL   RBC 4.44 3.70 - 5.45 10e6/uL   RDW 13.2 11.2 - 14.5 %   lymph# 3.0 0.9 - 3.3 10e3/uL   MONO# 1.3 (H) 0.1 - 0.9 10e3/uL   Eosinophils Absolute 0.1 0.0 - 0.5 10e3/uL   Basophils Absolute 0.1 0.0 - 0.1 10e3/uL   NEUT% 62.4 38.4 - 76.8 %   LYMPH% 25.4 14.0 - 49.7 %   MONO% 10.6 0.0 - 14.0 %   EOS% 1.2 0.0 - 7.0 %   BASO% 0.4 0.0 - 2.0 %  Comprehensive metabolic panel     Status: None   Collection Time: 09/06/14  3:06 PM  Result Value Ref Range   Sodium 141 136 - 145 mEq/L   Potassium 4.5 3.5 - 5.1 mEq/L   Chloride 107 98 - 109 mEq/L   CO2 25 22 - 29 mEq/L   Glucose 90 70 - 140 mg/dl   BUN 13.1 7.0 - 26.0 mg/dL   Creatinine 0.8 0.6 - 1.1 mg/dL   Total Bilirubin <0.20 0.20 - 1.20 mg/dL   Alkaline Phosphatase 118 40 - 150 U/L   AST 15 5 - 34 U/L   ALT 18 0 - 55 U/L   Total Protein 7.1 6.4 - 8.3 g/dL   Albumin 3.9 3.5 - 5.0 g/dL   Calcium 9.1 8.4 - 10.4 mg/dL   Anion Gap 9 3 - 11 mEq/L   EGFR >90 >90 ml/min/1.73 m2    Comment: eGFR is calculated using the CKD-EPI Creatinine  Equation (2009)    Physical Exam: Constitutional:  BP 134/90 mmHg  Pulse 93  Ht _0  (1.676 m)  Wt 198 lb 6.4 oz (89.994 kg)  BMI 32.04 kg/m2  Musculoskeletal: Strength & Muscle Tone: within normal limits Gait & Station: normal Patient leans: N/A  Mental Status Examination;   Patient is casually dressed and fairly groomed.  She is cooperative and maintained fair eye contact. Her speech is fast but clear and coherent  Her attention and concentration is fair.  Her thought processes is logical.  She described her mood tired and anxious  Her affect is mood appropriate.  She denies any auditory or visual hallucination.  She denies any active or passive suicidal thoughts or homicidal thoughts.  Her fund of knowledge is adequate.  There were no tremors or shakes.  There were no delusions, paranoia or any obsession.  There were no flight of ideas or any loose association present at this time.  She is alert and oriented x3.  Her insight judgment and impulse control is okay.     Assessment: Axis I:  Bipolar disorder NOS, attention deficit disorder, anxiety disorder NOS    Axis II:  Deferred   Axis III:  Past Medical History  Diagnosis Date  . Attention deficit disorder without mention of hyperactivity   . Anxiety state, unspecified   . Pain in joint, site unspecified   . Unspecified asthma(493.90)   . Migraine without aura, without mention of intractable migraine without mention of status migrainosus   . Depressive disorder, not elsewhere classified   . Unspecified essential hypertension   . Unspecified vitamin D deficiency   . Hypertension   . Esophageal reflux    Plan:  I review her records from primary care physician including recent blood work and her current medication.  Despite taking multiple psycho Petra Kuba medication she still have anxiety and complaining of nervousness.  I discussed polypharmacy in detail.  Patient does not believe that she is taking too much medication.  We  will defer any further adjustment to her medication.  Encouraged to see Miguel Dibble for counseling at [facility].  Patient has lot of psychosocial issues.  I will continue Abilify 30 mg daily, Cogentin 1 mg at bedtime, Valium 5 mg 1-2 tablet as needed.  Discussed medication side effects and benefits.  Continue Vyvanse 30 mg to help her attention and focus.  Today her blood pressure is slightly increase and we will defer increasing her stimulants.  Patient like to follow-up in [place] since she lived in that area.  She is seeing therapists in [place].  We will try to schedule an [place] office however if she is unable to find any appointment that she can see [place] in 3 months.  Discussed medication side effects and benefits.  Recommended to call us back if she has any question or any concern. She is getting nortriptyline and Cymbalta from her primary care physician.  Time spent 25 minutes , greater than 50% of the time spent in counseling, coordination of care, prognosis, long-term effect of medication and safety concerns.  Discuss weight maintenance and sleep hygiene and encouraged to keep appointment with primary care provider in regards to her medical condition .  I will see her again in 3 months.  Michelle Wnek T., MD 09/07/2014

## 2014-09-07 NOTE — Progress Notes (Signed)
THERAPIST PROGRESS NOTE  Session Time: 1:10 p.m. - 2:10 p.m.  Participation Level: Active  Behavioral Response: CasualAlertAnxious, Depressed, Irritable and Worthless  Type of Therapy: Individual Therapy  Treatment Goals addressed: Anger, Anxiety and Coping  Interventions: CBT, Strength-based, Supportive, Family Systems and Reframing  Summary: [name] is a 44 y.o. female who returns to OPT following a visit with her Psychiatrist, Dr. Adele Schilder this morning. Initially in session she was anxious, felt over-whelmed and with some irritability yet calmed down and voiced feeling better at end of session. She informed LCSW "I was fired."  Client has been "released" from his care and has been referred to a Psychiatrist in this office.  [name] expressed mix emotions yet is hopeful about starting care with a new physician.  She would like to see if her appointment could be moved up from November 2016.  Shared that she stayed in her room and was tearful and depressed last Friday and Saturday, denied SI at that time and voiced "I pushed through it and put on my happy face like everyone wants to see."  Client talked to LCSW about a conversation that she had with spouse in an effort to let him see her without her "happy face" on and to try to explain reasons for her moods yet did not feel that he understood her and remains convinced that in order to keep the marital relationship together that she must maintain the persona of being happier than she really is.  Other stressors voiced around sister's homelessness, increased responsibility for her 90 year old nephew and ongoing role as primary care-giver for her mother.  "I'm done making her my priority."  Additional history shared regarding strain in relationship with sister due to sister's addictive behaviors.  Mania has decreased and [name] admitted to LCSW "I was coming down some when I saw you last but I was manic. Now I have the credit card bills to  show."  In terms of symptoms, she did indicate that she was having a bit more focus with Vyvanse at 30 mg as evidenced by writing in her journal yet can only do this for short periods but still better than it was previously per client.  "I feel like I'm almost there." "I'm not going on Lithium or Depakote."  She reported that these made her feel too flat and with belief/preference "I've got to feel some things."  "I'm so worried about this review."  No date yet for her SSDI review.  She voiced fear about going back to work and how her symptoms were in April 30, 2011.  "I'm scared to death about becoming over-whelmed again."  History provided to LCSW about events around this time and concerns about becoming this compromised again.  Previous therapy experiences were not as effective per client as she feels sessions with LCSW have been.  At least one previous positive therapy experience was described with [name] stating "I don't usually come back after two or three sessions. I like you because I don't feel that you are judging me."  She agreed to read through hand-outs provided and voiced understanding of information shared by LCSW today in session.  No additional concerns voiced other than about starting care with a new Psychiatrist.  Suicidal/Homicidal: Negativewithout intent/plan  [name] admits to at least once a month episode of fleeting SI with thought to overdose on Insulin yet adamantly denied previous attempts or plans to go through with this.   Therapist Response: LCSW offered client much reassurance  along with active and reflective listening to continue establishing trust and rapport.  Supportive psychotherapy with insight was provided to client along with much emotional support through normalizing her feelings voiced in relation to current psychosocial stressors and life experiences. Discussed the difficulties of client's current situation and commended client for staying open to the possibilities of therapy.   CBT discussed as treatment option to assist her to recognize and learn ways to restructure unhealthy thinking patterns and benefits of this being increased self-management of intense emotions, including fear and sadness that are realistic and understandable in context of diagnosis, family dynamics and degree of functional losses. Examined cognitions and used CBT to defuse thoughts of helplessness, negative forecasting and catastrophizing about symptoms and outcomes of these symptoms.  Plan: Return again in two weeks.  [place] will keep appointments and read therapy hand-outs and establish therapy goals for she and LCSW to monitor between sessions.  LCSW will coordinate her care with current Psychiatrist, Dr. Adele Schilder, PRN and continue to assess client's motivation and those barriers that are present and interfering with client's motivation to participate in the therapeutic process.  Diagnosis: BiPolar I, MRE, Depressed   ADHD   Generalized anxiety disorder   Miguel Dibble, LCSW 09/07/2014

## 2014-09-13 ENCOUNTER — Other Ambulatory Visit: Payer: Self-pay | Admitting: *Deleted

## 2014-09-13 ENCOUNTER — Other Ambulatory Visit: Payer: Self-pay | Admitting: Oncology

## 2014-09-13 DIAGNOSIS — D751 Secondary polycythemia: Secondary | ICD-10-CM

## 2014-09-14 ENCOUNTER — Ambulatory Visit (INDEPENDENT_AMBULATORY_CARE_PROVIDER_SITE_OTHER): Payer: BLUE CROSS/BLUE SHIELD | Admitting: Family Medicine

## 2014-09-14 ENCOUNTER — Encounter: Payer: Self-pay | Admitting: Family Medicine

## 2014-09-14 VITALS — BP 136/84 | HR 74 | Ht 66.0 in | Wt 200.0 lb

## 2014-09-14 DIAGNOSIS — M9420 Chondromalacia, unspecified site: Secondary | ICD-10-CM

## 2014-09-14 NOTE — Progress Notes (Signed)
Pre visit review using our clinic review tool, if applicable. No additional management support is needed unless otherwise documented below in the visit note. 

## 2014-09-14 NOTE — Assessment & Plan Note (Signed)
Patient does have good response to the injections at this time. Encourage her to continue to stay active. Patient will continue to wear the brace with significant activity. Patient continue the oral anti-inflammatory in the topical if necessary. Patient will come back and see me again in 2-3 months and we can repeat injection if necessary.

## 2014-09-14 NOTE — Patient Instructions (Signed)
Good to see you You are doing great! Ice is your friend COntinue everything you are doing You know where I am

## 2014-09-14 NOTE — Progress Notes (Signed)
[name] [address], [place] [address] Phone: [phone] Subjective:    I'm seeing this patient by the request  of:  Walker Kehr, MD   CC:  Left knee pain  NID:[id number] [name] is a 44 y.o. female coming in with complaint of left knee pain. Patient states that the knee pain is mostly bilateral. Has noticed though more on the left knee. Patient has had the pain seemed to be worsening over the course last several weeks. Patient had so severe that he she did discuss this with her pain management physician. He diagnosed it as more of a post traumatic arthritis. Patient had a x-ray which was unremarkable and an MRI of the left knee was ordered on June 16. MRI was reviewed by me and showed 10 mm area of grade 4 chondromalacia of the lateral facet of the patella.  Patient was not responding to conservative therapy. Patient did have injections in the knees bilaterally one month ago. Patient was to continue the home exercises, icing, as well as topical anti-inflammatory's. Patient also had oral anti-blind torus for break through pain. Patient states she is feeling about 80% better after the injections. Patient is trying to remain active. Patient is actually going to the gym right after this appointment. States that she'll he has pain when she squats too far.  Past Medical History  Diagnosis Date  . Attention deficit disorder without mention of hyperactivity   . Anxiety state, unspecified   . Pain in joint, site unspecified   . Unspecified asthma(493.90)   . Migraine without aura, without mention of intractable migraine without mention of status migrainosus   . Depressive disorder, not elsewhere classified   . Unspecified essential hypertension   . Unspecified vitamin D deficiency   . Hypertension   . Esophageal reflux    Past Surgical History  Procedure Laterality Date  . Partial hysterectomy  2007  . Tonsillectomy  04/29/00  . Incise and  drain abcess  2011    Right axilla- Dr. Barkley Bruns  . Abdominal ultrasound  10/12/97  . Plantar fascia surgery  2000  . Transthoracic echocardiogram  11/2009    EF=>55%; trace MR & TR  . Nm myocar perf wall motion  11/2009    bruce myoview; perfusion defect in anterior region consistent with breast attenuation; remaining myocardium with normal perfusion; post-stress EF 74%; low risk scan   . Appendectomy  05/27/2013    gangrenous  . Abdominal hysterectomy    . Tonsillectomy Bilateral    Social History  Substance Use Topics  . Smoking status: Current Every Day Smoker -- 1.00 packs/day for 27 years    Types: Cigarettes  . Smokeless tobacco: Never Used  . Alcohol Use: No   Allergies  Allergen Reactions  . Amoxicillin Itching    REACTION: QUESTIONABLE  . Doxycycline Nausea And Vomiting    REACTION: vomiting  . Hydrochlorothiazide     REACTION: CRAMPS AT HIGHER DOSAGES  . Lisinopril     Other reaction(s): Cough (finding) REACTION: COUGH  . Penicillins   . Percocet [Oxycodone-Acetaminophen] Itching    itching  . Talwin [Pentazocine] Nausea And Vomiting    n/v   Family History  Problem Relation Age of Onset  . Asthma      Family history  . Coronary artery disease      1st degree female < 50  . Diabetes      1st degree relative  . Breast cancer Mother   .  Multiple sclerosis Mother   . Cancer Mother 19    breast ca  . Depression Mother   . Lupus Sister     PTSD  . Alcohol abuse Sister   . Bipolar disorder Sister   . Alcohol abuse Brother     also HTN, lupus, enlarged heart  . Heart disease Father     cardiac arrest  . Heart disease Maternal Grandfather     cardiac arrest  . COPD Paternal Grandmother   . Diabetes Paternal Grandfather   . Heart disease Paternal Grandfather   . ADD / ADHD Child   . Asthma Child        Past medical history, social, surgical and family history all reviewed in electronic medical record.   Review of Systems: No headache, visual  changes, nausea, vomiting, diarrhea, constipation, dizziness, abdominal pain, skin rash, fevers, chills, night sweats, weight loss, swollen lymph nodes, body aches, joint swelling, muscle aches, chest pain, shortness of breath, mood changes.   Objective Blood pressure 136/84, pulse 74, height 5\' 6"  (1.676 m), weight 200 lb (90.719 kg).  General: No apparent distress alert and oriented x3 mood and affect normal, dressed appropriately.  HEENT: Pupils equal, extraocular movements intact  Respiratory: Patient's speak in full sentences and does not appear short of breath  Cardiovascular: No lower extremity edema, non tender, no erythema  Skin: Warm dry intact with no signs of infection or rash on extremities or on axial skeleton.  Abdomen: Soft nontender  Neuro: Cranial nerves II through XII are intact, neurovascularly intact in all extremities with 2+ DTRs and 2+ pulses.  Lymph: No lymphadenopathy of posterior or anterior cervical chain or axillae bilaterally.  Gait normal with good balance and coordination.  MSK:  Non tender with full range of motion and good stability and symmetric strength and tone of shoulders, elbows, wrist, hip, and ankles bilaterally.  Knee: Left Normal to inspection with no erythema or effusion or obvious bony abnormalities. Still minimally tender over the patella ROM full in flexion and extension and lower leg rotation. Ligaments with solid consistent endpoints including ACL, PCL, LCL, MCL. Negative Mcmurray's, Apley's, and Thessalonian tests. painful patellar compression. Patellar glide with moderate crepitus. J lateral tracking noted Patellar and quadriceps tendons unremarkable. Hamstring and quadriceps strength is normal.  Right knee also has pain with patellar compression and mild lateral tracking    Impression and Recommendations:     This case required medical decision making of moderate complexity.

## 2014-09-20 ENCOUNTER — Encounter: Payer: Self-pay | Admitting: Pain Medicine

## 2014-09-20 ENCOUNTER — Ambulatory Visit: Payer: BLUE CROSS/BLUE SHIELD | Attending: Pain Medicine | Admitting: Pain Medicine

## 2014-09-20 VITALS — BP 121/88 | HR 80 | Temp 98.0°F | Resp 16 | Ht 66.0 in | Wt 195.0 lb

## 2014-09-20 DIAGNOSIS — M170 Bilateral primary osteoarthritis of knee: Secondary | ICD-10-CM | POA: Diagnosis not present

## 2014-09-20 DIAGNOSIS — M5136 Other intervertebral disc degeneration, lumbar region: Secondary | ICD-10-CM | POA: Diagnosis not present

## 2014-09-20 DIAGNOSIS — M5134 Other intervertebral disc degeneration, thoracic region: Secondary | ICD-10-CM

## 2014-09-20 DIAGNOSIS — M47816 Spondylosis without myelopathy or radiculopathy, lumbar region: Secondary | ICD-10-CM | POA: Insufficient documentation

## 2014-09-20 DIAGNOSIS — M5137 Other intervertebral disc degeneration, lumbosacral region: Secondary | ICD-10-CM

## 2014-09-20 DIAGNOSIS — M546 Pain in thoracic spine: Secondary | ICD-10-CM | POA: Diagnosis present

## 2014-09-20 DIAGNOSIS — M533 Sacrococcygeal disorders, not elsewhere classified: Secondary | ICD-10-CM | POA: Diagnosis not present

## 2014-09-20 DIAGNOSIS — M545 Low back pain: Secondary | ICD-10-CM | POA: Diagnosis present

## 2014-09-20 DIAGNOSIS — M5459 Other low back pain: Secondary | ICD-10-CM

## 2014-09-20 DIAGNOSIS — M5124 Other intervertebral disc displacement, thoracic region: Secondary | ICD-10-CM | POA: Insufficient documentation

## 2014-09-20 DIAGNOSIS — G588 Other specified mononeuropathies: Secondary | ICD-10-CM | POA: Diagnosis not present

## 2014-09-20 MED ORDER — DICLOFENAC SODIUM 1 % TD GEL: TRANSDERMAL | Status: DC

## 2014-09-20 MED ORDER — TRAMADOL HCL 50 MG PO TABS: ORAL_TABLET | ORAL | Status: DC

## 2014-09-20 MED ORDER — ORPHENADRINE CITRATE ER 100 MG PO TB12: ORAL_TABLET | ORAL | Status: DC

## 2014-09-20 NOTE — Progress Notes (Signed)
   Subjective:    Patient ID: [name], female    DOB: July 09, 1970, 44 y.o.   MRN: [id number]  HPI  Patient is 44 year old female returns to [place] for further evaluation and treatment of pain involving the region of the upper mid and lower back lower extremity region and knees. Patient states that she has had improvement of her pain. Patient states that the spasms of the mid and lower back have decreased. Patient also states that she has minimal pain involving the region of the knee and patient presently is wearing brace on the knee . We will continue Norflex tramadol and Voltaren gel at this time and we'll avoid interventional treatment. The patient is doing rather well at this time. The patient is in agreement with suggested treatment plan  Review of Systems     Objective:   Physical Exam  There was tenderness to palpation of the cervical facet cervical paraspinal muscular region of mild degree with mild muscle spasms in the cervical paraspinal musculature region. There was mild tenderness of the splenius capitis and occipitalis musculature regions. Patient appeared to be with unremarkable Spurling's maneuver. Patient was with bilaterally equal grip strength. Tinel and Phalen's maneuver were without increased pain of significant degree. Outpatient over the lower thoracic paraspinal musculature region thoracic facet region was with moderate muscle spasms. Extension and palpation over the lumbar facets reproduced pain of moderate degree. I will bending and rotation was associated with moderate increased pain of moderate muscle spasms of the lumbar region were noted. There was tenderness over the region of the PSIS and PII S regions. The gluteal and piriformis musculature regions reproduced pain of mild degree. The knee was with tenderness to palpation with crepitus of the knee with negative anterior and posterior drawer signs and no ballottement of the patella. There was no  increased warmth or erythema in the region of the knee. EHL strength was decreased in no sensory deficit of dermatomal distribution was detected. There was negative clonus and negative Homans. Abdomen was soft nontender and no costovertebral angle tenderness was noted.      Assessment & Plan:    Degenerative disc disease of the thoracic spine Degenerative changes thoracic spine is with most significant involvement T8-T9 with chronic endplate changes and height loss of the vertebral bodies of the mid and lower thoracic spine region, posterior disc bulging seen in the lower thoracic region.  Thoracic facet syndrome  Intercostal neuralgia with severe muscle spasms  Degenerative disc disease lumbar spine Degenerative changes L3-4, L4-5, and L5-S1, degenerative changes  Sacroiliac joint dysfunction  Degenerative joint disease of knees    Plan   Continue present medications Norflex and Ultram and  F/U PCP Dr Alain Marion for evaliation of  BP and general medical  condition  F/U surgical evaluation  F/U neurological evaluation  May consider radiofrequency rhizolysis or intraspinal procedures pending response to present treatment and F/U evaluation   Patient to call [facility] should patient have concerns prior to scheduled return appointmen.

## 2014-09-20 NOTE — Patient Instructions (Signed)
Continue present medications Norflex and Ultram and Voltaren gel   We will request insurance approval for radiofrequency rhizolysis lumbar facet, medial branch nerve and we will perform when your schedule permits  F/U PCP Dr Alain Marion for evaliation of  BP and general medical  condition  F/U surgical evaluation  F/U neurological evaluation  May consider radiofrequency rhizolysis or intraspinal procedures pending response to present treatment and F/U evaluation   Patient to call [facility] should patient have concerns prior to scheduled return appointmen.

## 2014-09-20 NOTE — Progress Notes (Signed)
Safety precautions to be maintained throughout the outpatient stay will include: orient to surroundings, keep bed in low position, maintain call bell within reach at all times, provide assistance with transfer out of bed and ambulation.  

## 2014-09-21 ENCOUNTER — Ambulatory Visit: Payer: Self-pay | Admitting: Licensed Clinical Social Worker

## 2014-09-22 ENCOUNTER — Telehealth: Payer: Self-pay | Admitting: *Deleted

## 2014-09-22 NOTE — Telephone Encounter (Signed)
Rf req for Lyrica 50 mg. Ok to rf 90 day supply?

## 2014-09-23 ENCOUNTER — Ambulatory Visit: Payer: Self-pay | Admitting: Licensed Clinical Social Worker

## 2014-09-23 MED ORDER — PREGABALIN 50 MG PO CAPS: 50.0000 mg | ORAL_CAPSULE | Freq: Three times a day (TID) | ORAL | Status: DC

## 2014-09-23 NOTE — Telephone Encounter (Signed)
Done. See meds.  

## 2014-09-23 NOTE — Telephone Encounter (Signed)
OK to fill this prescription with additional refills x1 Thank you!  

## 2014-09-29 ENCOUNTER — Ambulatory Visit (INDEPENDENT_AMBULATORY_CARE_PROVIDER_SITE_OTHER): Payer: Commercial Indemnity | Admitting: Licensed Clinical Social Worker

## 2014-09-29 DIAGNOSIS — F909 Attention-deficit hyperactivity disorder, unspecified type: Secondary | ICD-10-CM

## 2014-09-29 DIAGNOSIS — F411 Generalized anxiety disorder: Secondary | ICD-10-CM | POA: Diagnosis not present

## 2014-09-29 DIAGNOSIS — F3132 Bipolar disorder, current episode depressed, moderate: Secondary | ICD-10-CM

## 2014-09-29 DIAGNOSIS — F988 Other specified behavioral and emotional disorders with onset usually occurring in childhood and adolescence: Secondary | ICD-10-CM

## 2014-09-30 NOTE — Progress Notes (Signed)
THERAPIST PROGRESS NOTE  Session Time: 2:05 p.m. - 3:10 p.m.  Participation Level: Active  Behavioral Response: CasualAlertAnxious, Depressed and Hopeless  Type of Therapy: Individual Therapy  Treatment Goals addressed: Anxiety, Communication: assertive with boundary setting and Coping  Interventions: CBT, Solution Focused, Strength-based, Supportive and Reframing  Summary: [name] is a 44 y.o. female who presents with worsening depressive symptoms over course of two weeks.  She was tearful throughout the session. Client has gone to the gym three times this week and began to feel good until today and indicated that she almost cancelled therapy session. "I feel like I'm wasting your time. Do you ever feel like there are some people that you can't help?"  Symptoms of depression include feeling alone, that no one cares, crying spells, decrease interests, energy, apathy, irritability, fleeting SI, negative self talk, general sadness and feelings of helplessness and hopelessness.  She remains engaged in the therapy process and admitted to LCSW that she has begun reading the hand-outs and partially completed a work-sheet that she admitted was forgotten at home.  "I'm in pain. I don't think anyone understands how much pain depression causes." Various factors/causes including client with additional stress as result of sister's son living in the home with she and her family because the sister is using drugs again and unavailable to the teen along with client's mother's ongoing physical and emotional dependency on client for care needs.  Dondrea discussed source of her sense of obligation for family members given her own mother's history of having had multiple marriages, treatment for depression and both suicide threats and attempts which resulted in [place] becoming a parentified child.  Other childhood stressors were discussed that client agreed with LCSW's theory about anxiety and learned  helplessness/fear are environmental impacts of living in a family without healthy boundaries or ways of coping with negative situations and emotions.  Inadequate pattern of communication with partner and family members was discussed and client's ongoing acceptance that she does repress her feelings. The fear of spouse leaving the marriage remains a major trigger for client's depression and anxiety.  She talked about recent situation where she asked spouse to buy her something and he dismissed the importance of this for [facility].  Fair to good insight regarding spouse's own reactions/behaviors to requests around spending money and client voiced "I know that my illness has not been easy on him."  Arrayah maintains that she has to make self "look happy" while also recognizing that this is not working in terms of really coping with her negative emotions.  Self-criticism and irrational beliefs/thoughts were also discussed along with social isolation, not feeling appreciated and a few other factors that may be causing depressive episode/relapse.  She maintains that she is taking medications as prescribed and jokingly stated "I know my acid reflux medicine is working. I think it's the only one working."  [name] declined that her need was to be referred to another therapist and again informed LCSW "You're the first therapist that I've really opened up to. I don't feel like you're judging me."  She agreed to re-read and bring back to next session the previous psycho-educational materials provided to her at previous sessions for discussion.   Suicidal/Homicidal: Negativewithout intent/plan  Therapist Response:   Gently addressed with client that sometimes the progress made or not made in therapy is the result of several factors. Discussed these while empowering client that LCSW believes that she has the capacity to make necessary changes towards a  healthier and happier life. LCSW offered client much reassurance  along with active and reflective listening to continue establishing trust and rapport.  Supportive psychotherapy with insight was provided to client along with much emotional support through normalizing her feelings voiced in relation to current psychosocial stressors and life experiences. Re-framed client's symptoms not as a sign of weakness, rather a chronic illness with risks of relapse like any other chronic illness.  CBT used to restructure thoughts with emphasis on helping client to manage emotions, including fear and sadness that are realistic and understandable in context of diagnosis and degree of functional losses.  Examined cognitions and used CBT to defuse thoughts of helplessness, negative forecasting and catastrophizing about symptoms and outcomes of these symptoms.  Plan: Return again in two weeks.  Dinah will keep appointments and read therapy hand-outs and establish therapy goals for she and LCSW to monitor between sessions.  LCSW will coordinate her care with Psychiatrist and continue to assess client's motivation and those barriers that are present and interfering with client's coping.  Diagnosis: BiPolar I, MRE, Depressed   ADHD   Generalized anxiety disorder   Miguel Dibble, LCSW 09/30/2014

## 2014-10-04 ENCOUNTER — Encounter: Payer: Self-pay | Admitting: Internal Medicine

## 2014-10-04 ENCOUNTER — Ambulatory Visit (INDEPENDENT_AMBULATORY_CARE_PROVIDER_SITE_OTHER): Payer: BLUE CROSS/BLUE SHIELD | Admitting: Internal Medicine

## 2014-10-04 VITALS — BP 110/68 | HR 81 | Ht 66.0 in | Wt 201.0 lb

## 2014-10-04 DIAGNOSIS — I10 Essential (primary) hypertension: Secondary | ICD-10-CM

## 2014-10-04 DIAGNOSIS — J4520 Mild intermittent asthma, uncomplicated: Secondary | ICD-10-CM | POA: Diagnosis not present

## 2014-10-04 DIAGNOSIS — F319 Bipolar disorder, unspecified: Secondary | ICD-10-CM

## 2014-10-04 DIAGNOSIS — Z0000 Encounter for general adult medical examination without abnormal findings: Secondary | ICD-10-CM | POA: Diagnosis not present

## 2014-10-04 DIAGNOSIS — Z23 Encounter for immunization: Secondary | ICD-10-CM | POA: Diagnosis not present

## 2014-10-04 NOTE — Progress Notes (Signed)
Subjective:  Patient ID: [name], female    DOB: [birth year]  Age: 44 y.o. MRN: [id number]  CC: No chief complaint on file.   HPI [name] presents for well exam  Outpatient Prescriptions Prior to Visit  Medication Sig Dispense Refill  . albuterol (PROAIR HFA) 108 (90 BASE) MCG/ACT inhaler Inhale 2 puffs into the lungs every 4 (four) hours as needed. For shortness of breath. 18 g 2  . albuterol (PROVENTIL) (2.5 MG/3ML) 0.083% nebulizer solution Take 3 mLs (2.5 mg total) by nebulization every 6 (six) hours as needed. For shortness of breath. 75 mL 0  . ARIPiprazole (ABILIFY) 30 MG tablet Take one tablet by mouth one time daily 30 tablet 2  . aspirin (BAYER ASPIRIN) 325 MG tablet Take 1 tablet (325 mg total) by mouth daily. 100 tablet 3  . benztropine (COGENTIN) 1 MG tablet Take 1 tablet (1 mg total) by mouth at bedtime. 30 tablet 2  . budesonide-formoterol (SYMBICORT) 160-4.5 MCG/ACT inhaler Inhale 2 puffs into the lungs 2 (two) times daily. 1 Inhaler 2  . diazepam (VALIUM) 5 MG tablet Take one tab daily and 2 nd if needed 45 tablet 2  . diclofenac sodium (VOLTAREN) 1 % GEL Apply 2-4 mg to painful area of skin 4 times per day if tolerated . Limit 4 grams per application 371 g 2  . DULoxetine (CYMBALTA) 30 MG capsule Take 1 capsule (30 mg total) by mouth 2 (two) times daily. 60 capsule 11  . eletriptan (RELPAX) 40 MG tablet Take 1 tablet (40 mg total) by mouth as needed for migraine or headache. May repeat in 2 hours if headache persists or recurs. 15 tablet 6  . Linaclotide (LINZESS) 145 MCG CAPS capsule Take 1-2 capsules (145-290 mcg total) by mouth daily as needed. 60 capsule 11  . lisdexamfetamine (VYVANSE) 30 MG capsule Take 1 capsule (30 mg total) by mouth daily. 30 capsule 0  . meloxicam (MOBIC) 15 MG tablet Take 1 tablet (15 mg total) by mouth daily as needed for pain. 30 tablet 5  . metoprolol (LOPRESSOR) 50 MG tablet Take 50 mg by mouth daily.    . nicotine (NICODERM  CQ) 14 mg/24hr patch Place 1 patch (14 mg total) onto the skin daily. 28 patch 0  . nicotine (NICODERM CQ) 21 mg/24hr patch Place 1 patch (21 mg total) onto the skin daily. 28 patch 0  . nicotine (NICODERM CQ) 7 mg/24hr patch Place 1 patch (7 mg total) onto the skin daily. 28 patch 0  . nortriptyline (PAMELOR) 10 MG capsule 3 tabs by mouth at bedtime 90 capsule 5  . orphenadrine (NORFLEX) 100 MG tablet Limit 1 tab by mouth per day or twice per day if tolerated 60 tablet 2  . pantoprazole (PROTONIX) 40 MG tablet Take 1 tablet (40 mg total) by mouth daily. 90 tablet 3  . pregabalin (LYRICA) 50 MG capsule Take 1 capsule (50 mg total) by mouth 3 (three) times daily. 90 capsule 1  . ranitidine (ZANTAC) 300 MG tablet Take 1 tablet (300 mg total) by mouth at bedtime. 90 tablet 3  . traMADol (ULTRAM) 50 MG tablet Limit 1 tablet by mouth 3-6 times per day if tolerated 180 tablet 0  . Vitamin D, Ergocalciferol, (DRISDOL) 50000 UNITS CAPS capsule Take 1 capsule (50,000 Units total) by mouth every 7 (seven) days. 8 capsule 0   No facility-administered medications prior to visit.    ROS Review of Systems  Constitutional: Negative for chills,  activity change, appetite change, fatigue and unexpected weight change.  HENT: Negative for congestion, mouth sores and sinus pressure.   Eyes: Negative for visual disturbance.  Respiratory: Negative for cough and chest tightness.   Gastrointestinal: Negative for nausea and abdominal pain.  Genitourinary: Negative for frequency, difficulty urinating and vaginal pain.  Musculoskeletal: Negative for back pain and gait problem.  Skin: Negative for pallor and rash.  Neurological: Negative for dizziness, tremors, weakness, numbness and headaches.  Psychiatric/Behavioral: Positive for dysphoric mood. Negative for suicidal ideas, behavioral problems, confusion, sleep disturbance, decreased concentration and agitation. The patient is nervous/anxious.     Objective:  BP  110/68 mmHg  Pulse 81  Ht 5\' 6"  (1.676 m)  Wt 201 lb (91.173 kg)  BMI 32.46 kg/m2  SpO2 96%  BP Readings from Last 3 Encounters:  10/04/14 110/68  09/20/14 121/88  09/14/14 136/84    Wt Readings from Last 3 Encounters:  10/04/14 201 lb (91.173 kg)  09/20/14 195 lb (88.451 kg)  09/14/14 200 lb (90.719 kg)    Physical Exam  Constitutional: She appears well-developed. No distress.  HENT:  Head: Normocephalic.  Right Ear: External ear normal.  Left Ear: External ear normal.  Nose: Nose normal.  Mouth/Throat: Oropharynx is clear and moist.  Eyes: Conjunctivae are normal. Pupils are equal, round, and reactive to light. Right eye exhibits no discharge. Left eye exhibits no discharge.  Neck: Normal range of motion. Neck supple. No JVD present. No tracheal deviation present. No thyromegaly present.  Cardiovascular: Normal rate, regular rhythm and normal heart sounds.   Pulmonary/Chest: No stridor. No respiratory distress. She has no wheezes.  Abdominal: Soft. Bowel sounds are normal. She exhibits no distension and no mass. There is no tenderness. There is no rebound and no guarding.  Musculoskeletal: She exhibits no edema or tenderness.  Lymphadenopathy:    She has no cervical adenopathy.  Neurological: She displays normal reflexes. No cranial nerve deficit. She exhibits normal muscle tone. Coordination normal.  Skin: No rash noted. No erythema.  Psychiatric: Her behavior is normal. Judgment and thought content normal.    Lab Results  Component Value Date   WBC 11.9* 09/06/2014   HGB 14.1 09/06/2014   HCT 40.2 09/06/2014   PLT 252 09/06/2014   GLUCOSE 90 09/06/2014   CHOL 225* 10/01/2013   TRIG 98.0 10/01/2013   HDL 41.10 10/01/2013   LDLDIRECT 161.1 02/18/2013   LDLCALC 164* 10/01/2013   ALT 18 09/06/2014   AST 15 09/06/2014   NA 141 09/06/2014   K 4.5 09/06/2014   CL 101 08/17/2014   CREATININE 0.8 09/06/2014   BUN 13.1 09/06/2014   CO2 25 09/06/2014   TSH 1.83  10/01/2013   INR 1.05 08/29/2014   HGBA1C 5.5 08/17/2014    No results found.  Assessment & Plan:   There are no diagnoses linked to this encounter. I am having Ms. Mullett maintain her aspirin, meloxicam, pantoprazole, metoprolol, ranitidine, budesonide-formoterol, albuterol, albuterol, nortriptyline, Vitamin D (Ergocalciferol), DULoxetine, eletriptan, nicotine, nicotine, nicotine, Linaclotide, ARIPiprazole, benztropine, diazepam, lisdexamfetamine, traMADol, orphenadrine, diclofenac sodium, and pregabalin.  No orders of the defined types were placed in this encounter.     Follow-up: No Follow-up on file.  Walker Kehr, MD

## 2014-10-04 NOTE — Patient Instructions (Signed)
Preventive Care for Adults A healthy lifestyle and preventive care can promote health and wellness. Preventive health guidelines for women include the following key practices.  A routine yearly physical is a good way to check with your health care provider about your health and preventive screening. It is a chance to share any concerns and updates on your health and to receive a thorough exam.  Visit your dentist for a routine exam and preventive care every 6 months. Brush your teeth twice a day and floss once a day. Good oral hygiene prevents tooth decay and gum disease.  The frequency of eye exams is based on your age, health, family medical history, use of contact lenses, and other factors. Follow your health care provider's recommendations for frequency of eye exams.  Eat a healthy diet. Foods like vegetables, fruits, whole grains, low-fat dairy products, and lean protein foods contain the nutrients you need without too many calories. Decrease your intake of foods high in solid fats, added sugars, and salt. Eat the right amount of calories for you.Get information about a proper diet from your health care provider, if necessary.  Regular physical exercise is one of the most important things you can do for your health. Most adults should get at least 150 minutes of moderate-intensity exercise (any activity that increases your heart rate and causes you to sweat) each week. In addition, most adults need muscle-strengthening exercises on 2 or more days a week.  Maintain a healthy weight. The body mass index (BMI) is a screening tool to identify possible weight problems. It provides an estimate of body fat based on height and weight. Your health care provider can find your BMI and can help you achieve or maintain a healthy weight.For adults 20 years and older:  A BMI below 18.5 is considered underweight.  A BMI of 18.5 to 24.9 is normal.  A BMI of 25 to 29.9 is considered overweight.  A BMI of  30 and above is considered obese.  Maintain normal blood lipids and cholesterol levels by exercising and minimizing your intake of saturated fat. Eat a balanced diet with plenty of fruit and vegetables. Blood tests for lipids and cholesterol should begin at age 76 and be repeated every 5 years. If your lipid or cholesterol levels are high, you are over 50, or you are at high risk for heart disease, you may need your cholesterol levels checked more frequently.Ongoing high lipid and cholesterol levels should be treated with medicines if diet and exercise are not working.  If you smoke, find out from your health care provider how to quit. If you do not use tobacco, do not start.  Lung cancer screening is recommended for adults aged 22-80 years who are at high risk for developing lung cancer because of a history of smoking. A yearly low-dose CT scan of the lungs is recommended for people who have at least a 30-pack-year history of smoking and are a current smoker or have quit within the past 15 years. A pack year of smoking is smoking an average of 1 pack of cigarettes a day for 1 year (for example: 1 pack a day for 30 years or 2 packs a day for 15 years). Yearly screening should continue until the smoker has stopped smoking for at least 15 years. Yearly screening should be stopped for people who develop a health problem that would prevent them from having lung cancer treatment.  If you are pregnant, do not drink alcohol. If you are breastfeeding,  be very cautious about drinking alcohol. If you are not pregnant and choose to drink alcohol, do not have more than 1 drink per day. One drink is considered to be 12 ounces (355 mL) of beer, 5 ounces (148 mL) of wine, or 1.5 ounces (44 mL) of liquor.  Avoid use of street drugs. Do not share needles with anyone. Ask for help if you need support or instructions about stopping the use of drugs.  High blood pressure causes heart disease and increases the risk of  stroke. Your blood pressure should be checked at least every 1 to 2 years. Ongoing high blood pressure should be treated with medicines if weight loss and exercise do not work.  If you are 75-52 years old, ask your health care provider if you should take aspirin to prevent strokes.  Diabetes screening involves taking a blood sample to check your fasting blood sugar level. This should be done once every 3 years, after age 15, if you are within normal weight and without risk factors for diabetes. Testing should be considered at a younger age or be carried out more frequently if you are overweight and have at least 1 risk factor for diabetes.  Breast cancer screening is essential preventive care for women. You should practice "breast self-awareness." This means understanding the normal appearance and feel of your breasts and may include breast self-examination. Any changes detected, no matter how small, should be reported to a health care provider. Women in their 58s and 30s should have a clinical breast exam (CBE) by a health care provider as part of a regular health exam every 1 to 3 years. After age 16, women should have a CBE every year. Starting at age 53, women should consider having a mammogram (breast X-ray test) every year. Women who have a family history of breast cancer should talk to their health care provider about genetic screening. Women at a high risk of breast cancer should talk to their health care providers about having an MRI and a mammogram every year.  Breast cancer gene (BRCA)-related cancer risk assessment is recommended for women who have family members with BRCA-related cancers. BRCA-related cancers include breast, ovarian, tubal, and peritoneal cancers. Having family members with these cancers may be associated with an increased risk for harmful changes (mutations) in the breast cancer genes BRCA1 and BRCA2. Results of the assessment will determine the need for genetic counseling and  BRCA1 and BRCA2 testing.  Routine pelvic exams to screen for cancer are no longer recommended for nonpregnant women who are considered low risk for cancer of the pelvic organs (ovaries, uterus, and vagina) and who do not have symptoms. Ask your health care provider if a screening pelvic exam is right for you.  If you have had past treatment for cervical cancer or a condition that could lead to cancer, you need Pap tests and screening for cancer for at least 20 years after your treatment. If Pap tests have been discontinued, your risk factors (such as having a new sexual partner) need to be reassessed to determine if screening should be resumed. Some women have medical problems that increase the chance of getting cervical cancer. In these cases, your health care provider may recommend more frequent screening and Pap tests.  The HPV test is an additional test that may be used for cervical cancer screening. The HPV test looks for the virus that can cause the cell changes on the cervix. The cells collected during the Pap test can be  tested for HPV. The HPV test could be used to screen women aged 30 years and older, and should be used in women of any age who have unclear Pap test results. After the age of 30, women should have HPV testing at the same frequency as a Pap test.  Colorectal cancer can be detected and often prevented. Most routine colorectal cancer screening begins at the age of 50 years and continues through age 75 years. However, your health care provider may recommend screening at an earlier age if you have risk factors for colon cancer. On a yearly basis, your health care provider may provide home test kits to check for hidden blood in the stool. Use of a small camera at the end of a tube, to directly examine the colon (sigmoidoscopy or colonoscopy), can detect the earliest forms of colorectal cancer. Talk to your health care provider about this at age 50, when routine screening begins. Direct  exam of the colon should be repeated every 5-10 years through age 75 years, unless early forms of pre-cancerous polyps or small growths are found.  People who are at an increased risk for hepatitis B should be screened for this virus. You are considered at high risk for hepatitis B if:  You were born in a country where hepatitis B occurs often. Talk with your health care provider about which countries are considered high risk.  Your parents were born in a high-risk country and you have not received a shot to protect against hepatitis B (hepatitis B vaccine).  You have HIV or AIDS.  You use needles to inject street drugs.  You live with, or have sex with, someone who has hepatitis B.  You get hemodialysis treatment.  You take certain medicines for conditions like cancer, organ transplantation, and autoimmune conditions.  Hepatitis C blood testing is recommended for all people born from 1945 through 1965 and any individual with known risks for hepatitis C.  Practice safe sex. Use condoms and avoid high-risk sexual practices to reduce the spread of sexually transmitted infections (STIs). STIs include gonorrhea, chlamydia, syphilis, trichomonas, herpes, HPV, and human immunodeficiency virus (HIV). Herpes, HIV, and HPV are viral illnesses that have no cure. They can result in disability, cancer, and death.  You should be screened for sexually transmitted illnesses (STIs) including gonorrhea and chlamydia if:  You are sexually active and are younger than 24 years.  You are older than 24 years and your health care provider tells you that you are at risk for this type of infection.  Your sexual activity has changed since you were last screened and you are at an increased risk for chlamydia or gonorrhea. Ask your health care provider if you are at risk.  If you are at risk of being infected with HIV, it is recommended that you take a prescription medicine daily to prevent HIV infection. This is  called preexposure prophylaxis (PrEP). You are considered at risk if:  You are a heterosexual woman, are sexually active, and are at increased risk for HIV infection.  You take drugs by injection.  You are sexually active with a partner who has HIV.  Talk with your health care provider about whether you are at high risk of being infected with HIV. If you choose to begin PrEP, you should first be tested for HIV. You should then be tested every 3 months for as long as you are taking PrEP.  Osteoporosis is a disease in which the bones lose minerals and strength   with aging. This can result in serious bone fractures or breaks. The risk of osteoporosis can be identified using a bone density scan. Women ages 65 years and over and women at risk for fractures or osteoporosis should discuss screening with their health care providers. Ask your health care provider whether you should take a calcium supplement or vitamin D to reduce the rate of osteoporosis.  Menopause can be associated with physical symptoms and risks. Hormone replacement therapy is available to decrease symptoms and risks. You should talk to your health care provider about whether hormone replacement therapy is right for you.  Use sunscreen. Apply sunscreen liberally and repeatedly throughout the day. You should seek shade when your shadow is shorter than you. Protect yourself by wearing long sleeves, pants, a wide-brimmed hat, and sunglasses year round, whenever you are outdoors.  Once a month, do a whole body skin exam, using a mirror to look at the skin on your back. Tell your health care provider of new moles, moles that have irregular borders, moles that are larger than a pencil eraser, or moles that have changed in shape or color.  Stay current with required vaccines (immunizations).  Influenza vaccine. All adults should be immunized every year.  Tetanus, diphtheria, and acellular pertussis (Td, Tdap) vaccine. Pregnant women should  receive 1 dose of Tdap vaccine during each pregnancy. The dose should be obtained regardless of the length of time since the last dose. Immunization is preferred during the 27th-36th week of gestation. An adult who has not previously received Tdap or who does not know her vaccine status should receive 1 dose of Tdap. This initial dose should be followed by tetanus and diphtheria toxoids (Td) booster doses every 10 years. Adults with an unknown or incomplete history of completing a 3-dose immunization series with Td-containing vaccines should begin or complete a primary immunization series including a Tdap dose. Adults should receive a Td booster every 10 years.  Varicella vaccine. An adult without evidence of immunity to varicella should receive 2 doses or a second dose if she has previously received 1 dose. Pregnant females who do not have evidence of immunity should receive the first dose after pregnancy. This first dose should be obtained before leaving the health care facility. The second dose should be obtained 4-8 weeks after the first dose.  Human papillomavirus (HPV) vaccine. Females aged 13-26 years who have not received the vaccine previously should obtain the 3-dose series. The vaccine is not recommended for use in pregnant females. However, pregnancy testing is not needed before receiving a dose. If a female is found to be pregnant after receiving a dose, no treatment is needed. In that case, the remaining doses should be delayed until after the pregnancy. Immunization is recommended for any person with an immunocompromised condition through the age of 26 years if she did not get any or all doses earlier. During the 3-dose series, the second dose should be obtained 4-8 weeks after the first dose. The third dose should be obtained 24 weeks after the first dose and 16 weeks after the second dose.  Zoster vaccine. One dose is recommended for adults aged 60 years or older unless certain conditions are  present.  Measles, mumps, and rubella (MMR) vaccine. Adults born before 1957 generally are considered immune to measles and mumps. Adults born in 1957 or later should have 1 or more doses of MMR vaccine unless there is a contraindication to the vaccine or there is laboratory evidence of immunity to   each of the three diseases. A routine second dose of MMR vaccine should be obtained at least 28 days after the first dose for students attending postsecondary schools, health care workers, or international travelers. People who received inactivated measles vaccine or an unknown type of measles vaccine during 1963-1967 should receive 2 doses of MMR vaccine. People who received inactivated mumps vaccine or an unknown type of mumps vaccine before 1979 and are at high risk for mumps infection should consider immunization with 2 doses of MMR vaccine. For females of childbearing age, rubella immunity should be determined. If there is no evidence of immunity, females who are not pregnant should be vaccinated. If there is no evidence of immunity, females who are pregnant should delay immunization until after pregnancy. Unvaccinated health care workers born before 1957 who lack laboratory evidence of measles, mumps, or rubella immunity or laboratory confirmation of disease should consider measles and mumps immunization with 2 doses of MMR vaccine or rubella immunization with 1 dose of MMR vaccine.  Pneumococcal 13-valent conjugate (PCV13) vaccine. When indicated, a person who is uncertain of her immunization history and has no record of immunization should receive the PCV13 vaccine. An adult aged 19 years or older who has certain medical conditions and has not been previously immunized should receive 1 dose of PCV13 vaccine. This PCV13 should be followed with a dose of pneumococcal polysaccharide (PPSV23) vaccine. The PPSV23 vaccine dose should be obtained at least 8 weeks after the dose of PCV13 vaccine. An adult aged 19  years or older who has certain medical conditions and previously received 1 or more doses of PPSV23 vaccine should receive 1 dose of PCV13. The PCV13 vaccine dose should be obtained 1 or more years after the last PPSV23 vaccine dose.  Pneumococcal polysaccharide (PPSV23) vaccine. When PCV13 is also indicated, PCV13 should be obtained first. All adults aged 65 years and older should be immunized. An adult younger than age 65 years who has certain medical conditions should be immunized. Any person who resides in a nursing home or long-term care facility should be immunized. An adult smoker should be immunized. People with an immunocompromised condition and certain other conditions should receive both PCV13 and PPSV23 vaccines. People with human immunodeficiency virus (HIV) infection should be immunized as soon as possible after diagnosis. Immunization during chemotherapy or radiation therapy should be avoided. Routine use of PPSV23 vaccine is not recommended for American Indians, [place] Natives, or people younger than 65 years unless there are medical conditions that require PPSV23 vaccine. When indicated, people who have unknown immunization and have no record of immunization should receive PPSV23 vaccine. One-time revaccination 5 years after the first dose of PPSV23 is recommended for people aged 19-64 years who have chronic kidney failure, nephrotic syndrome, asplenia, or immunocompromised conditions. People who received 1-2 doses of PPSV23 before age 65 years should receive another dose of PPSV23 vaccine at age 65 years or later if at least 5 years have passed since the previous dose. Doses of PPSV23 are not needed for people immunized with PPSV23 at or after age 65 years.  Meningococcal vaccine. Adults with asplenia or persistent complement component deficiencies should receive 2 doses of quadrivalent meningococcal conjugate (MenACWY-D) vaccine. The doses should be obtained at least 2 months apart.  Microbiologists working with certain meningococcal bacteria, military recruits, people at risk during an outbreak, and people who travel to or live in countries with a high rate of meningitis should be immunized. A first-year college student up through age   21 years who is living in a residence hall should receive a dose if she did not receive a dose on or after her 16th birthday. Adults who have certain high-risk conditions should receive one or more doses of vaccine.  Hepatitis A vaccine. Adults who wish to be protected from this disease, have certain high-risk conditions, work with hepatitis A-infected animals, work in hepatitis A research labs, or travel to or work in countries with a high rate of hepatitis A should be immunized. Adults who were previously unvaccinated and who anticipate close contact with an international adoptee during the first 60 days after arrival in the [place] from a country with a high rate of hepatitis A should be immunized.  Hepatitis B vaccine. Adults who wish to be protected from this disease, have certain high-risk conditions, may be exposed to blood or other infectious body fluids, are household contacts or sex partners of hepatitis B positive people, are clients or workers in certain care facilities, or travel to or work in countries with a high rate of hepatitis B should be immunized.  Haemophilus influenzae type b (Hib) vaccine. A previously unvaccinated person with asplenia or sickle cell disease or having a scheduled splenectomy should receive 1 dose of Hib vaccine. Regardless of previous immunization, a recipient of a hematopoietic stem cell transplant should receive a 3-dose series 6-12 months after her successful transplant. Hib vaccine is not recommended for adults with HIV infection. Preventive Services / Frequency Ages 64 to 68 years  Blood pressure check.** / Every 1 to 2 years.  Lipid and cholesterol check.** / Every 5 years beginning at age  22.  Clinical breast exam.** / Every 3 years for women in their 88s and 53s.  BRCA-related cancer risk assessment.** / For women who have family members with a BRCA-related cancer (breast, ovarian, tubal, or peritoneal cancers).  Pap test.** / Every 2 years from ages 90 through 51. Every 3 years starting at age 21 through age 56 or 3 with a history of 3 consecutive normal Pap tests.  HPV screening.** / Every 3 years from ages 24 through ages 1 to 46 with a history of 3 consecutive normal Pap tests.  Hepatitis C blood test.** / For any individual with known risks for hepatitis C.  Skin self-exam. / Monthly.  Influenza vaccine. / Every year.  Tetanus, diphtheria, and acellular pertussis (Tdap, Td) vaccine.** / Consult your health care provider. Pregnant women should receive 1 dose of Tdap vaccine during each pregnancy. 1 dose of Td every 10 years.  Varicella vaccine.** / Consult your health care provider. Pregnant females who do not have evidence of immunity should receive the first dose after pregnancy.  HPV vaccine. / 3 doses over 6 months, if 72 and younger. The vaccine is not recommended for use in pregnant females. However, pregnancy testing is not needed before receiving a dose.  Measles, mumps, rubella (MMR) vaccine.** / You need at least 1 dose of MMR if you were born in 1957 or later. You may also need a 2nd dose. For females of childbearing age, rubella immunity should be determined. If there is no evidence of immunity, females who are not pregnant should be vaccinated. If there is no evidence of immunity, females who are pregnant should delay immunization until after pregnancy.  Pneumococcal 13-valent conjugate (PCV13) vaccine.** / Consult your health care provider.  Pneumococcal polysaccharide (PPSV23) vaccine.** / 1 to 2 doses if you smoke cigarettes or if you have certain conditions.  Meningococcal vaccine.** /  1 dose if you are age 19 to 21 years and a first-year college  student living in a residence hall, or have one of several medical conditions, you need to get vaccinated against meningococcal disease. You may also need additional booster doses.  Hepatitis A vaccine.** / Consult your health care provider.  Hepatitis B vaccine.** / Consult your health care provider.  Haemophilus influenzae type b (Hib) vaccine.** / Consult your health care provider. Ages 40 to 64 years  Blood pressure check.** / Every 1 to 2 years.  Lipid and cholesterol check.** / Every 5 years beginning at age 20 years.  Lung cancer screening. / Every year if you are aged 55-80 years and have a 30-pack-year history of smoking and currently smoke or have quit within the past 15 years. Yearly screening is stopped once you have quit smoking for at least 15 years or develop a health problem that would prevent you from having lung cancer treatment.  Clinical breast exam.** / Every year after age 40 years.  BRCA-related cancer risk assessment.** / For women who have family members with a BRCA-related cancer (breast, ovarian, tubal, or peritoneal cancers).  Mammogram.** / Every year beginning at age 40 years and continuing for as long as you are in good health. Consult with your health care provider.  Pap test.** / Every 3 years starting at age 30 years through age 65 or 70 years with a history of 3 consecutive normal Pap tests.  HPV screening.** / Every 3 years from ages 30 years through ages 65 to 70 years with a history of 3 consecutive normal Pap tests.  Fecal occult blood test (FOBT) of stool. / Every year beginning at age 50 years and continuing until age 75 years. You may not need to do this test if you get a colonoscopy every 10 years.  Flexible sigmoidoscopy or colonoscopy.** / Every 5 years for a flexible sigmoidoscopy or every 10 years for a colonoscopy beginning at age 50 years and continuing until age 75 years.  Hepatitis C blood test.** / For all people born from 1945 through  1965 and any individual with known risks for hepatitis C.  Skin self-exam. / Monthly.  Influenza vaccine. / Every year.  Tetanus, diphtheria, and acellular pertussis (Tdap/Td) vaccine.** / Consult your health care provider. Pregnant women should receive 1 dose of Tdap vaccine during each pregnancy. 1 dose of Td every 10 years.  Varicella vaccine.** / Consult your health care provider. Pregnant females who do not have evidence of immunity should receive the first dose after pregnancy.  Zoster vaccine.** / 1 dose for adults aged 60 years or older.  Measles, mumps, rubella (MMR) vaccine.** / You need at least 1 dose of MMR if you were born in 1957 or later. You may also need a 2nd dose. For females of childbearing age, rubella immunity should be determined. If there is no evidence of immunity, females who are not pregnant should be vaccinated. If there is no evidence of immunity, females who are pregnant should delay immunization until after pregnancy.  Pneumococcal 13-valent conjugate (PCV13) vaccine.** / Consult your health care provider.  Pneumococcal polysaccharide (PPSV23) vaccine.** / 1 to 2 doses if you smoke cigarettes or if you have certain conditions.  Meningococcal vaccine.** / Consult your health care provider.  Hepatitis A vaccine.** / Consult your health care provider.  Hepatitis B vaccine.** / Consult your health care provider.  Haemophilus influenzae type b (Hib) vaccine.** / Consult your health care provider. Ages 65   years and over  Blood pressure check.** / Every 1 to 2 years.  Lipid and cholesterol check.** / Every 5 years beginning at age 22 years.  Lung cancer screening. / Every year if you are aged 73-80 years and have a 30-pack-year history of smoking and currently smoke or have quit within the past 15 years. Yearly screening is stopped once you have quit smoking for at least 15 years or develop a health problem that would prevent you from having lung cancer  treatment.  Clinical breast exam.** / Every year after age 4 years.  BRCA-related cancer risk assessment.** / For women who have family members with a BRCA-related cancer (breast, ovarian, tubal, or peritoneal cancers).  Mammogram.** / Every year beginning at age 40 years and continuing for as long as you are in good health. Consult with your health care provider.  Pap test.** / Every 3 years starting at age 9 years through age 34 or 91 years with 3 consecutive normal Pap tests. Testing can be stopped between 65 and 70 years with 3 consecutive normal Pap tests and no abnormal Pap or HPV tests in the past 10 years.  HPV screening.** / Every 3 years from ages 57 years through ages 64 or 45 years with a history of 3 consecutive normal Pap tests. Testing can be stopped between 65 and 70 years with 3 consecutive normal Pap tests and no abnormal Pap or HPV tests in the past 10 years.  Fecal occult blood test (FOBT) of stool. / Every year beginning at age 15 years and continuing until age 17 years. You may not need to do this test if you get a colonoscopy every 10 years.  Flexible sigmoidoscopy or colonoscopy.** / Every 5 years for a flexible sigmoidoscopy or every 10 years for a colonoscopy beginning at age 86 years and continuing until age 71 years.  Hepatitis C blood test.** / For all people born from [birth year] through 1963 and any individual with known risks for hepatitis C.  Osteoporosis screening.** / A one-time screening for women ages 83 years and over and women at risk for fractures or osteoporosis.  Skin self-exam. / Monthly.  Influenza vaccine. / Every year.  Tetanus, diphtheria, and acellular pertussis (Tdap/Td) vaccine.** / 1 dose of Td every 10 years.  Varicella vaccine.** / Consult your health care provider.  Zoster vaccine.** / 1 dose for adults aged 61 years or older.  Pneumococcal 13-valent conjugate (PCV13) vaccine.** / Consult your health care provider.  Pneumococcal  polysaccharide (PPSV23) vaccine.** / 1 dose for all adults aged 28 years and older.  Meningococcal vaccine.** / Consult your health care provider.  Hepatitis A vaccine.** / Consult your health care provider.  Hepatitis B vaccine.** / Consult your health care provider.  Haemophilus influenzae type b (Hib) vaccine.** / Consult your health care provider. ** Family history and personal history of risk and conditions may change your health care provider's recommendations. Document Released: 03/12/2001 Document Revised: 05/31/2013 Document Reviewed: 06/11/2010 [facility] Patient Information 2015 [place], [place]. This information is not intended to replace advice given to you by your health care provider. Make sure you discuss any questions you have with your health care provider.

## 2014-10-04 NOTE — Addendum Note (Signed)
Addended by: Cresenciano Lick on: 10/04/2014 04:30 PM   Modules accepted: Orders

## 2014-10-04 NOTE — Assessment & Plan Note (Signed)
On Metoprolol 

## 2014-10-04 NOTE — Assessment & Plan Note (Signed)
Here for medicare wellness/physical  Diet: heart healthy  Physical activity: not sedentary  Depression/mood screen: negative  Hearing: intact to whispered voice  Visual acuity: grossly normal, performs annual eye exam  ADLs: capable  Fall risk: small to none  Home safety: good  Cognitive evaluation: intact to orientation, naming, recall and repetition  EOL planning: adv directives, full code/ I agree  I have personally reviewed and have noted  1. The patient's medical, surgical and social history  2. Their use of alcohol, tobacco or illicit drugs  3. Their current medications and supplements  4. The patient's functional ability including ADL's, fall risks, home safety risks and hearing or visual impairment.  5. Diet and physical activities  6. Evidence for depression or mood disorders - present, see hx 7. The roster of all physicians providing medical care to patient - is listed in the Snapshot section of the chart and reviewed today.    Today patient counseled on age appropriate routine health concerns for screening and prevention, each reviewed and up to date or declined. Immunizations reviewed and up to date or declined. Labs ordered and reviewed. Risk factors for depression reviewed and negative. Hearing function and visual acuity are intact. ADLs screened and addressed as needed. Functional ability and level of safety reviewed and appropriate. Education, counseling and referrals performed based on assessed risks today. Patient provided with a copy of personalized plan for preventive services.   Mammo, PAP up to date Flu shot

## 2014-10-04 NOTE — Assessment & Plan Note (Signed)
On Rx Flu shot

## 2014-10-04 NOTE — Assessment & Plan Note (Signed)
Pt will be seeing a new psychiatrist

## 2014-10-04 NOTE — Progress Notes (Signed)
Pre visit review using our clinic review tool, if applicable. No additional management support is needed unless otherwise documented below in the visit note. 

## 2014-10-05 ENCOUNTER — Other Ambulatory Visit: Payer: Self-pay | Admitting: *Deleted

## 2014-10-05 ENCOUNTER — Encounter: Payer: Self-pay | Admitting: Family Medicine

## 2014-10-05 ENCOUNTER — Ambulatory Visit (INDEPENDENT_AMBULATORY_CARE_PROVIDER_SITE_OTHER): Payer: BLUE CROSS/BLUE SHIELD | Admitting: Family Medicine

## 2014-10-05 VITALS — BP 126/88 | HR 97 | Ht 66.0 in | Wt 201.0 lb

## 2014-10-05 DIAGNOSIS — M94261 Chondromalacia, right knee: Secondary | ICD-10-CM | POA: Diagnosis not present

## 2014-10-05 DIAGNOSIS — M94262 Chondromalacia, left knee: Secondary | ICD-10-CM | POA: Diagnosis not present

## 2014-10-05 DIAGNOSIS — M9420 Chondromalacia, unspecified site: Secondary | ICD-10-CM

## 2014-10-05 MED ORDER — MELOXICAM 15 MG PO TABS: 15.0000 mg | ORAL_TABLET | Freq: Every day | ORAL | Status: DC | PRN

## 2014-10-05 NOTE — Assessment & Plan Note (Signed)
Patient does have chondromalacia seen on MRI. Patient has not responded to any other conservative therapies. Continues to take oral anti-inflammatory's as well as bracing. Patient is very motivated to become active and lose weight. We discussed icing regimen and home exercises. Patient was started on Synvisc supplementation today which I hope will be beneficial. Patient come back in 1 week for second in a series of 3 injections bilaterally.

## 2014-10-05 NOTE — Progress Notes (Signed)
[name] [name], [place] [address] Phone: [phone] Subjective:    I'm seeing this patient by the request  of:  Walker Kehr, MD   CC:  Left knee pain  [id number] [name] is a 44 y.o. female coming in with complaint of left knee pain. Patient states that the knee pain is mostly bilateral. Has noticed though more on the left knee. Patient did have the steroid injections but did not do very well. Continues to increase her activity but unfortunately is having worsening pain in both knees. States that they feel like they're giving out on her. States that she has some instability as well. Has failed all other conservative therapy including formal physical therapy at this point.  Past Medical History  Diagnosis Date  . Attention deficit disorder without mention of hyperactivity   . Anxiety state, unspecified   . Pain in joint, site unspecified   . Unspecified asthma(493.90)   . Migraine without aura, without mention of intractable migraine without mention of status migrainosus   . Depressive disorder, not elsewhere classified   . Unspecified essential hypertension   . Unspecified vitamin D deficiency   . Hypertension   . Esophageal reflux    Past Surgical History  Procedure Laterality Date  . Partial hysterectomy  2007  . Tonsillectomy  04/29/00  . Incise and drain abcess  2011    Right axilla- Dr. Barkley Bruns  . Abdominal ultrasound  10/12/97  . Plantar fascia surgery  2000  . Transthoracic echocardiogram  11/2009    EF=>55%; trace MR & TR  . Nm myocar perf wall motion  11/2009    bruce myoview; perfusion defect in anterior region consistent with breast attenuation; remaining myocardium with normal perfusion; post-stress EF 74%; low risk scan   . Appendectomy  05/27/2013    gangrenous  . Abdominal hysterectomy    . Tonsillectomy Bilateral    Social History  Substance Use Topics  . Smoking status: Current Every Day  Smoker -- 1.00 packs/day for 27 years    Types: Cigarettes  . Smokeless tobacco: Never Used  . Alcohol Use: No   Allergies  Allergen Reactions  . Amoxicillin Itching    REACTION: QUESTIONABLE  . Doxycycline Nausea And Vomiting    REACTION: vomiting  . Hydrochlorothiazide     REACTION: CRAMPS AT HIGHER DOSAGES  . Lisinopril     Other reaction(s): Cough (finding) REACTION: COUGH  . Penicillins   . Percocet [Oxycodone-Acetaminophen] Itching    itching  . Talwin [Pentazocine] Nausea And Vomiting    n/v   Family History  Problem Relation Age of Onset  . Asthma      Family history  . Coronary artery disease      1st degree female < 50  . Diabetes      1st degree relative  . Breast cancer Mother   . Multiple sclerosis Mother   . Cancer Mother 95    breast ca  . Depression Mother   . Lupus Sister     PTSD  . Alcohol abuse Sister   . Bipolar disorder Sister   . Alcohol abuse Brother     also HTN, lupus, enlarged heart  . Heart disease Father     cardiac arrest  . Heart disease Maternal Grandfather     cardiac arrest  . COPD Paternal Grandmother   . Diabetes Paternal Grandfather   . Heart disease Paternal Grandfather   . ADD / ADHD  Child   . Asthma Child        Past medical history, social, surgical and family history all reviewed in electronic medical record.   Review of Systems: No headache, visual changes, nausea, vomiting, diarrhea, constipation, dizziness, abdominal pain, skin rash, fevers, chills, night sweats, weight loss, swollen lymph nodes, body aches, joint swelling, muscle aches, chest pain, shortness of breath, mood changes.   Objective Blood pressure 126/88, pulse 97, height 5\' 6"  (1.676 m), weight 201 lb (91.173 kg), SpO2 97 %.  General: No apparent distress alert and oriented x3 mood and affect normal, dressed appropriately.  HEENT: Pupils equal, extraocular movements intact  Respiratory: Patient's speak in full sentences and does not appear short of  breath  Cardiovascular: No lower extremity edema, non tender, no erythema  Skin: Warm dry intact with no signs of infection or rash on extremities or on axial skeleton.  Abdomen: Soft nontender  Neuro: Cranial nerves II through XII are intact, neurovascularly intact in all extremities with 2+ DTRs and 2+ pulses.  Lymph: No lymphadenopathy of posterior or anterior cervical chain or axillae bilaterally.  Gait normal with good balance and coordination.  MSK:  Non tender with full range of motion and good stability and symmetric strength and tone of shoulders, elbows, wrist, hip, and ankles bilaterally.  Knee: Left Normal to inspection with no erythema or effusion or obvious bony abnormalities. Still minimally tender over the patella ROM full in flexion and extension and lower leg rotation. Ligaments with solid consistent endpoints including ACL, PCL, LCL, MCL. Mild instability with varus force bilaterally. Negative Mcmurray's, Apley's, and Thessalonian tests. painful patellar compression. Patellar glide with moderate crepitus. J lateral tracking noted Patellar and quadriceps tendons unremarkable. Hamstring and quadriceps strength is normal.  Right knee also has pain with patellar compression and mild lateral tracking patient also severely tender over the medial joint line of the right knee which is more than previous exam.  Foot exam shows the patient does have some mild overpronation of the hindfoot bilaterally left greater than right as well as splaying between the first and second toes.  After informed written and verbal consent, patient was seated on exam table. Left knee was prepped with alcohol swab and utilizing anterolateral approach, patient's left knee space was injected with16 mg/2.5 mL of Synvisc (sodium hyaluronate) in a prefilled syringe was injected easily into the knee through a 22-gauge needle. Patient tolerated the procedure well without immediate complications.  After informed  written and verbal consent, patient was seated on exam table. Right knee was prepped with alcohol swab and utilizing anterolateral approach, patient's right knee space was injected with16 mg/2.5 mL of Synvisc (sodium hyaluronate) in a prefilled syringe was injected easily into the knee through a 22-gauge needle. Patient tolerated the procedure well without immediate complications.    Impression and Recommendations:     This case required medical decision making of moderate complexity.

## 2014-10-05 NOTE — Patient Instructions (Signed)
Good to see you Ice after activity Stay active Start gym again in 1 week  At 50% duration and increase 10% a week.  See me again in 1 week  We will get the orthotics as well.

## 2014-10-05 NOTE — Progress Notes (Signed)
Pre visit review using our clinic review tool, if applicable. No additional management support is needed unless otherwise documented below in the visit note. 

## 2014-10-06 ENCOUNTER — Encounter: Payer: Self-pay | Admitting: Family Medicine

## 2014-10-12 ENCOUNTER — Ambulatory Visit (INDEPENDENT_AMBULATORY_CARE_PROVIDER_SITE_OTHER): Payer: BLUE CROSS/BLUE SHIELD | Admitting: Family Medicine

## 2014-10-12 ENCOUNTER — Encounter: Payer: Self-pay | Admitting: Family Medicine

## 2014-10-12 VITALS — BP 104/80 | HR 72 | Ht 66.0 in | Wt 201.0 lb

## 2014-10-12 DIAGNOSIS — M9420 Chondromalacia, unspecified site: Secondary | ICD-10-CM

## 2014-10-12 DIAGNOSIS — M170 Bilateral primary osteoarthritis of knee: Secondary | ICD-10-CM | POA: Diagnosis not present

## 2014-10-12 DIAGNOSIS — M94262 Chondromalacia, left knee: Secondary | ICD-10-CM | POA: Diagnosis not present

## 2014-10-12 DIAGNOSIS — M94261 Chondromalacia, right knee: Secondary | ICD-10-CM | POA: Diagnosis not present

## 2014-10-12 NOTE — Assessment & Plan Note (Signed)
Given second in a series of 3 injections in the knees bilaterally. Patient's will come back and see me again in 1 week for third and final injections. Continue conservative therapy in the interim

## 2014-10-12 NOTE — Progress Notes (Signed)
Pre visit review using our clinic review tool, if applicable. No additional management support is needed unless otherwise documented below in the visit note. 

## 2014-10-12 NOTE — Progress Notes (Signed)
[name] [address], [place] [address] Phone: [phone] Subjective:    I'm seeing this patient by the request  of:  Walker Kehr, MD   CC:  Bilateral knee pain follow-up  [name] is a 44 y.o. female coming in with complaint of bilateral knee pain. Patient was found to have severe chondromalacia of the left knee on MRI continued symptoms on the right knee that seems to be consistent with the same diagnosis. Patient had failed all other conservative therapy and has started viscous supplementation. Patient is here for second in a series of 3 injections. Patient states for several day she did have more discomfort in the right knee but the left knee is feeling much better. Patient is trying to stay active and continue to do mostly aquatic therapy.  Past Medical History  Diagnosis Date  . Attention deficit disorder without mention of hyperactivity   . Anxiety state, unspecified   . Pain in joint, site unspecified   . Unspecified asthma(493.90)   . Migraine without aura, without mention of intractable migraine without mention of status migrainosus   . Depressive disorder, not elsewhere classified   . Unspecified essential hypertension   . Unspecified vitamin D deficiency   . Hypertension   . Esophageal reflux    Past Surgical History  Procedure Laterality Date  . Partial hysterectomy  2007  . Tonsillectomy  04/29/00  . Incise and drain abcess  2011    Right axilla- Dr. Barkley Bruns  . Abdominal ultrasound  10/12/97  . Plantar fascia surgery  2000  . Transthoracic echocardiogram  11/2009    EF=>55%; trace MR & TR  . Nm myocar perf wall motion  11/2009    bruce myoview; perfusion defect in anterior region consistent with breast attenuation; remaining myocardium with normal perfusion; post-stress EF 74%; low risk scan   . Appendectomy  05/27/2013    gangrenous  . Abdominal hysterectomy    . Tonsillectomy Bilateral     Social History  Substance Use Topics  . Smoking status: Current Every Day Smoker -- 1.00 packs/day for 27 years    Types: Cigarettes  . Smokeless tobacco: Never Used  . Alcohol Use: No   Allergies  Allergen Reactions  . Amoxicillin Itching    REACTION: QUESTIONABLE  . Doxycycline Nausea And Vomiting    REACTION: vomiting  . Hydrochlorothiazide     REACTION: CRAMPS AT HIGHER DOSAGES  . Lisinopril     Other reaction(s): Cough (finding) REACTION: COUGH  . Penicillins   . Percocet [Oxycodone-Acetaminophen] Itching    itching  . Talwin [Pentazocine] Nausea And Vomiting    n/v   Family History  Problem Relation Age of Onset  . Asthma      Family history  . Coronary artery disease      1st degree female < 50  . Diabetes      1st degree relative  . Breast cancer Mother   . Multiple sclerosis Mother   . Cancer Mother 75    breast ca  . Depression Mother   . Lupus Sister     PTSD  . Alcohol abuse Sister   . Bipolar disorder Sister   . Alcohol abuse Brother     also HTN, lupus, enlarged heart  . Heart disease Father     cardiac arrest  . Heart disease Maternal Grandfather     cardiac arrest  . COPD Paternal Grandmother   . Diabetes Paternal Grandfather   .  Heart disease Paternal Grandfather   . ADD / ADHD Child   . Asthma Child        Past medical history, social, surgical and family history all reviewed in electronic medical record.   Review of Systems: No headache, visual changes, nausea, vomiting, diarrhea, constipation, dizziness, abdominal pain, skin rash, fevers, chills, night sweats, weight loss, swollen lymph nodes, body aches, joint swelling, muscle aches, chest pain, shortness of breath, mood changes.   Objective There were no vitals taken for this visit.  General: No apparent distress alert and oriented x3 mood and affect normal, dressed appropriately.  HEENT: Pupils equal, extraocular movements intact  Respiratory: Patient's speak in full sentences  and does not appear short of breath  Cardiovascular: No lower extremity edema, non tender, no erythema  Skin: Warm dry intact with no signs of infection or rash on extremities or on axial skeleton.  Abdomen: Soft nontender  Neuro: Cranial nerves II through XII are intact, neurovascularly intact in all extremities with 2+ DTRs and 2+ pulses.  Lymph: No lymphadenopathy of posterior or anterior cervical chain or axillae bilaterally.  Gait normal with good balance and coordination.  MSK:  Non tender with full range of motion and good stability and symmetric strength and tone of shoulders, elbows, wrist, hip, and ankles bilaterally.  Knee: Bilateral Normal to inspection with no erythema or effusion or obvious bony abnormalities. Still minimally tender over the patella ROM full in flexion and extension and lower leg rotation. Ligaments with solid consistent endpoints including ACL, PCL, LCL, MCL. Mild instability with varus force bilaterally. Negative Mcmurray's, Apley's, and Thessalonian tests. painful patellar compression. Patellar glide with moderate crepitus. J lateral tracking noted Patellar and quadriceps tendons unremarkable. Hamstring and quadriceps strength is normal.    Foot exam shows the patient does have some mild overpronation of the hindfoot bilaterally left greater than right as well as splaying between the first and second toes.  After informed written and verbal consent, patient was seated on exam table. Left knee was prepped with alcohol swab and utilizing anterolateral approach, patient's left knee space was injected with16 mg/2.5 mL of Synvisc (sodium hyaluronate) in a prefilled syringe was injected easily into the knee through a 22-gauge needle. Patient tolerated the procedure well without immediate complications.  After informed written and verbal consent, patient was seated on exam table. Right knee was prepped with alcohol swab and utilizing anterolateral approach,  patient's right knee space was injected with16 mg/2.5 mL of Synvisc (sodium hyaluronate) in a prefilled syringe was injected easily into the knee through a 22-gauge needle. Patient tolerated the procedure well without immediate complications.    Impression and Recommendations:     This case required medical decision making of moderate complexity.

## 2014-10-12 NOTE — Progress Notes (Signed)
Patient was fitted for a : standard, cushioned, semi-rigid orthotic. The orthotic was heated and afterward the patient was in a seated position and the orthotic molded. The patient was positioned in subtalar neutral position and 10 degrees of ankle dorsiflexion in a non-weight bearing stance. After completion of molding, patient did have orthotic management which included instructions on acclimating to the orthotics, signs of ill fit as well as care for the orthotic.  I spent approximately 22minutes with the patient properly fitting the orthotics, making adjustments, discussing fit and transferring between shoes.   The blank was ground to a stable position for weight bearing. Size: 10 (Igli Comfort)  Base: Carbon fiber Additional Posting and Padding: The following postings were fitted onto the molded orthotics to help maintain a talar neutral position - Wedge posting for transverse arch: None at this time      Silicone posting for longitudinal arch:  250/120 and 250/100  The patient ambulated these, and they were very comfortable and supportive.

## 2014-10-12 NOTE — Assessment & Plan Note (Signed)
Patient's splint custom orthotics today. I think that this can help her knee pain significantly. We discussed this will help with alignment. This should hopefully help decrease some any tension and has on the anterior aspect of the knee. Patient will start to slowly wear these over the course the next 2 weeks until she is wearing them for full day. Patient continue all other conservative therapy and see me again in 3-4 weeks for further evaluation and treatment.

## 2014-10-12 NOTE — Patient Instructions (Signed)
Good to see you #2 down and one to go  Continue everything else you are doing Ice the knees in 6 hours.  See you again  Next week for 3rd and final injections.

## 2014-10-12 NOTE — Patient Instructions (Signed)
Acclimating to your Igli orthotics -  ° °We recommend that you allow up to 2 weeks for you to fully acclimate to your new custom orthotics. Please use the following recommended plan to build into full day wear.  ° °Day 1 - 2hours/day °Every day afterwards add 1 hour of wear(3hrs/day, 4hrs/day, etc) until you are able to wear them for an entire day without issues.  ° °If you notice any irritation or increasing discomfort with your new orthotics, please do not hesitate in contacting the office(leave a message for Baani Bober or Lindsay) or sending Larita Deremer a message through MyChart to arrange a time to review your fit.  ° °Enjoy your new orthotics!!  ° °Antjuan Rothe ° ° ° °

## 2014-10-19 ENCOUNTER — Ambulatory Visit (INDEPENDENT_AMBULATORY_CARE_PROVIDER_SITE_OTHER): Payer: BLUE CROSS/BLUE SHIELD | Admitting: Family Medicine

## 2014-10-19 ENCOUNTER — Encounter: Payer: Self-pay | Admitting: Family Medicine

## 2014-10-19 VITALS — BP 112/82 | HR 91 | Ht 66.0 in | Wt 201.0 lb

## 2014-10-19 DIAGNOSIS — M9420 Chondromalacia, unspecified site: Secondary | ICD-10-CM | POA: Diagnosis not present

## 2014-10-19 NOTE — Assessment & Plan Note (Signed)
Bilateral injections given today and patient is finalized the therapy. Continues to have right knee pain. If continuing to have trouble she has felt other conservative therapy and would need to see surgical(for arthroscopic procedure. Patient will call in the next 72 hours and if so we will refer. Otherwise patient follow-up in one month. Continue conservative therapy

## 2014-10-19 NOTE — Progress Notes (Signed)
Pre visit review using our clinic review tool, if applicable. No additional management support is needed unless otherwise documented below in the visit note. 

## 2014-10-19 NOTE — Patient Instructions (Signed)
3rd and final injections!!! You get a break from me [name] is your friend If knee hurts on Monday then call and I will get you in with a surgeon.

## 2014-10-19 NOTE — Progress Notes (Signed)
[name] [address] [place], [place] [address] Phone: [phone] Subjective:    I'm seeing this patient by the request  of:  Walker Kehr, MD   CC:  Bilateral knee pain follow-up  TDD:[id number] [name] is a 44 y.o. female coming in with complaint of bilateral knee pain. Patient was found to have severe chondromalacia of the left knee on MRI continued symptoms on the right knee that seems to be consistent with the same diagnosis. Patient had failed all other conservative therapy and has started viscous supplementation. Patient is here for the third injection bilaterally. Patient states of left knee is feeling incredible but unfortunately continues to have pain with right knee. States that it is affecting her daily activities. States that even going up and down stairs is significantly difficult. Patient is considering surgery.  Past Medical History  Diagnosis Date  . Attention deficit disorder without mention of hyperactivity   . Anxiety state, unspecified   . Pain in joint, site unspecified   . Unspecified asthma(493.90)   . Migraine without aura, without mention of intractable migraine without mention of status migrainosus   . Depressive disorder, not elsewhere classified   . Unspecified essential hypertension   . Unspecified vitamin D deficiency   . Hypertension   . Esophageal reflux    Past Surgical History  Procedure Laterality Date  . Partial hysterectomy  2007  . Tonsillectomy  04/29/00  . Incise and drain abcess  2011    Right axilla- Dr. Barkley Bruns  . Abdominal ultrasound  10/12/97  . Plantar fascia surgery  2000  . Transthoracic echocardiogram  11/2009    EF=>55%; trace MR & TR  . Nm myocar perf wall motion  11/2009    bruce myoview; perfusion defect in anterior region consistent with breast attenuation; remaining myocardium with normal perfusion; post-stress EF 74%; low risk scan   . Appendectomy  05/27/2013    gangrenous  .  Abdominal hysterectomy    . Tonsillectomy Bilateral    Social History  Substance Use Topics  . Smoking status: Current Every Day Smoker -- 1.00 packs/day for 27 years    Types: Cigarettes  . Smokeless tobacco: Never Used  . Alcohol Use: No   Allergies  Allergen Reactions  . Amoxicillin Itching    REACTION: QUESTIONABLE  . Doxycycline Nausea And Vomiting    REACTION: vomiting  . Hydrochlorothiazide     REACTION: CRAMPS AT HIGHER DOSAGES  . Lisinopril     Other reaction(s): Cough (finding) REACTION: COUGH  . Penicillins   . Percocet [Oxycodone-Acetaminophen] Itching    itching  . Talwin [Pentazocine] Nausea And Vomiting    n/v   Family History  Problem Relation Age of Onset  . Asthma      Family history  . Coronary artery disease      1st degree female < 50  . Diabetes      1st degree relative  . Breast cancer Mother   . Multiple sclerosis Mother   . Cancer Mother 37    breast ca  . Depression Mother   . Lupus Sister     PTSD  . Alcohol abuse Sister   . Bipolar disorder Sister   . Alcohol abuse Brother     also HTN, lupus, enlarged heart  . Heart disease Father     cardiac arrest  . Heart disease Maternal Grandfather     cardiac arrest  . COPD Paternal Grandmother   . Diabetes  Paternal Grandfather   . Heart disease Paternal Grandfather   . ADD / ADHD Child   . Asthma Child        Past medical history, social, surgical and family history all reviewed in electronic medical record.   Review of Systems: No headache, visual changes, nausea, vomiting, diarrhea, constipation, dizziness, abdominal pain, skin rash, fevers, chills, night sweats, weight loss, swollen lymph nodes, body aches, joint swelling, muscle aches, chest pain, shortness of breath, mood changes.   Objective Blood pressure 112/82, pulse 91, height 5\' 6"  (1.676 m), weight 201 lb (91.173 kg), SpO2 97 %.  General: No apparent distress alert and oriented x3 mood and affect normal, dressed  appropriately.  HEENT: Pupils equal, extraocular movements intact  Respiratory: Patient's speak in full sentences and does not appear short of breath  Cardiovascular: No lower extremity edema, non tender, no erythema  Skin: Warm dry intact with no signs of infection or rash on extremities or on axial skeleton.  Abdomen: Soft nontender  Neuro: Cranial nerves II through XII are intact, neurovascularly intact in all extremities with 2+ DTRs and 2+ pulses.  Lymph: No lymphadenopathy of posterior or anterior cervical chain or axillae bilaterally.  Gait normal with good balance and coordination.  MSK:  Non tender with full range of motion and good stability and symmetric strength and tone of shoulders, elbows, wrist, hip, and ankles bilaterally.  Knee: Bilateral Normal to inspection with no erythema or effusion or obvious bony abnormalities. Only tender on the right knee patient is tender over the patellofemoral joint laterally and medially. ROM full in flexion and extension and lower leg rotation. Ligaments with solid consistent endpoints including ACL, PCL, LCL, MCL. Mild instability with varus force bilaterally. Negative Mcmurray's, Apley's, and Thessalonian tests. painful patellar compression. Patellar glide with moderate crepitus. J lateral tracking noted Patellar and quadriceps tendons unremarkable. Hamstring and quadriceps strength is normal.    Foot exam shows the patient does have some mild overpronation of the hindfoot bilaterally left greater than right as well as splaying between the first and second toes.  After informed written and verbal consent, patient was seated on exam table. Left knee was prepped with alcohol swab and utilizing anterolateral approach, patient's left knee space was injected with16 mg/2.5 mL of Synvisc (sodium hyaluronate) in a prefilled syringe was injected easily into the knee through a 22-gauge needle. Patient tolerated the procedure well without immediate  complications.  After informed written and verbal consent, patient was seated on exam table. Right knee was prepped with alcohol swab and utilizing anterolateral approach, patient's right knee space was injected with16 mg/2.5 mL of Synvisc (sodium hyaluronate) in a prefilled syringe was injected easily into the knee through a 22-gauge needle. Patient tolerated the procedure well without immediate complications.    Impression and Recommendations:     This case required medical decision making of moderate complexity.

## 2014-10-20 ENCOUNTER — Ambulatory Visit: Payer: BLUE CROSS/BLUE SHIELD | Attending: Pain Medicine | Admitting: Pain Medicine

## 2014-10-20 ENCOUNTER — Encounter: Payer: Self-pay | Admitting: Family Medicine

## 2014-10-20 ENCOUNTER — Encounter: Payer: Self-pay | Admitting: Pain Medicine

## 2014-10-20 VITALS — BP 113/64 | HR 101 | Temp 97.7°F | Resp 18 | Ht 66.0 in | Wt 199.0 lb

## 2014-10-20 DIAGNOSIS — M5136 Other intervertebral disc degeneration, lumbar region: Secondary | ICD-10-CM | POA: Diagnosis not present

## 2014-10-20 DIAGNOSIS — M172 Bilateral post-traumatic osteoarthritis of knee: Secondary | ICD-10-CM

## 2014-10-20 DIAGNOSIS — M5134 Other intervertebral disc degeneration, thoracic region: Secondary | ICD-10-CM | POA: Diagnosis not present

## 2014-10-20 DIAGNOSIS — M47816 Spondylosis without myelopathy or radiculopathy, lumbar region: Secondary | ICD-10-CM | POA: Diagnosis not present

## 2014-10-20 DIAGNOSIS — M79604 Pain in right leg: Secondary | ICD-10-CM

## 2014-10-20 DIAGNOSIS — M533 Sacrococcygeal disorders, not elsewhere classified: Secondary | ICD-10-CM | POA: Insufficient documentation

## 2014-10-20 DIAGNOSIS — G588 Other specified mononeuropathies: Secondary | ICD-10-CM | POA: Diagnosis not present

## 2014-10-20 DIAGNOSIS — M5137 Other intervertebral disc degeneration, lumbosacral region: Secondary | ICD-10-CM

## 2014-10-20 DIAGNOSIS — M545 Low back pain: Secondary | ICD-10-CM | POA: Diagnosis present

## 2014-10-20 DIAGNOSIS — M5459 Other low back pain: Secondary | ICD-10-CM

## 2014-10-20 DIAGNOSIS — M5124 Other intervertebral disc displacement, thoracic region: Secondary | ICD-10-CM | POA: Diagnosis not present

## 2014-10-20 DIAGNOSIS — M47814 Spondylosis without myelopathy or radiculopathy, thoracic region: Secondary | ICD-10-CM | POA: Diagnosis not present

## 2014-10-20 DIAGNOSIS — M79605 Pain in left leg: Secondary | ICD-10-CM | POA: Diagnosis present

## 2014-10-20 DIAGNOSIS — M47894 Other spondylosis, thoracic region: Secondary | ICD-10-CM

## 2014-10-20 DIAGNOSIS — M170 Bilateral primary osteoarthritis of knee: Secondary | ICD-10-CM

## 2014-10-20 MED ORDER — TRAMADOL HCL 50 MG PO TABS: ORAL_TABLET | ORAL | Status: DC

## 2014-10-20 MED ORDER — ORPHENADRINE CITRATE ER 100 MG PO TB12: ORAL_TABLET | ORAL | Status: DC

## 2014-10-20 NOTE — Progress Notes (Signed)
Safety precautions to be maintained throughout the outpatient stay will include: orient to surroundings, keep bed in low position, maintain call bell within reach at all times, provide assistance with transfer out of bed and ambulation.  

## 2014-10-20 NOTE — Addendum Note (Signed)
Addended by: Montez Hageman on: 10/20/2014 10:04 AM   Modules accepted: Orders

## 2014-10-20 NOTE — Patient Instructions (Signed)
PLAN   Continue present medication Norflex tramadol and Voltaren gel  F/U PCP Dr.Plotnikov   for evaliation of  BP and general medical  condition  F/U surgical evaluation. Follow-up with Dr. Tamala Julian as planned  F/U neurological evaluation. May consider pending follow-up evaluations  May consider radiofrequency rhizolysis or intraspinal procedures pending response to present treatment and F/U evaluation   [facility] to call [facility] should patient have concerns prior to scheduled return appointment.

## 2014-10-20 NOTE — Progress Notes (Signed)
   Subjective:    Patient ID: [name], female    DOB: 12-Dec-1970, 44 y.o.   MRN: [id number]  HPI   patient is 44 year old female who returns to [place] for further evaluation and treatment of pain involving the mid lower back lower extremity region with pain of the knees rather severe degree at this time. Patient is undergone  Evaluation of knee by orthopedics with injection of knee haven't been performed by orthopedics as well. At the present time we will avoid interventional treatment and will continue tramadol and Voltaren gel. We'll remain available to consider interventional treatment as well as additional modification of treatment regimen pending response to injections of knees and surgical evaluation. The patient was understanding and agree with suggested treatment plan     Review of Systems     Objective:   Physical Exam   there was tenderness over the splenius capitis and occipitalis region of mild degree. There was mild tinnitus of the cervical facet cervical paraspinal musculature region as well as the thoracic facet thoracic paraspinal musculature region with no crepitus of the thoracic region noted. There was tenderness of the acromioclavicular and glenohumeral joint region palpation which reproduced minimal discomfort. Patient appeared to be with bilaterally equal grip strength. Tinel and Phalen's maneuver were without increase of pain of significant degree. Palpation of the mid thoracic region and lumbar region reproduces moderate discomfort. There was moderate increased pain with palpation over the lumbar facet lumbar paraspinal musculature region with lateral bending and rotation extension and palpation of the lumbar facets reproducing moderate discomfort. No crepitus of the thoracic region was noted. Palpation over the PSIS and PII S region associated with mild to moderate discomfort. There was mild to moderate tenderness of the greater trochanteric region  iliotibial band region. There was tenderness to palpation of the knees. Range of motion maneuvers of the knees reproduced severe disabling pain. There appeared to be negative anterior and posterior drawer signs without ballottement of the patella. Appeared to be no increased warmth of the knees noted. Patient was with braces of both knees 1 which were removed for evaluation examination period there was decreased EHL strength. No sensory deficit of dermatomal description the lower extremities noted. There was negative clonus negative Homans. Abdomen was nontender with no costovertebral angle tenderness noted.      Assessment & Plan:     Degenerative disc disease of the thoracic spine Degenerative changes thoracic spine is with most significant involvement T8-T9 with chronic endplate changes and height loss of the vertebral bodies of the mid and lower thoracic spine region, posterior disc bulging seen in the lower thoracic region.  Thoracic facet syndrome  Intercostal neuralgia with severe muscle spasms  Degenerative disc disease lumbar spine Degenerative changes L3-4, L4-5, and L5-S1, degenerative changes  Sacroiliac joint dysfunction  Degenerative joint disease of knees   PLAN   Continue present medication Tramadol and Voltaren gel  F/U PCP  Dr.Plotnikov for evaliation of pain of knees to consider rheumatological evaluation and for evaluation of BP and general medical  condition  F/U surgical evaluation.Patient is to undergo further surgical evaluation of knees as planned. The patient is undergone injection of knees by orthopedics  F/U neurological evaluation. May consider pending follow-up evaluations  May consider radiofrequency rhizolysis or intraspinal procedures pending response to present treatment and F/U evaluation   Patient to call [facility] should patient have concerns prior to scheduled return appointment.

## 2014-10-28 ENCOUNTER — Encounter: Payer: Self-pay | Admitting: Internal Medicine

## 2014-10-31 ENCOUNTER — Encounter: Payer: BLUE CROSS/BLUE SHIELD | Admitting: Psychiatry

## 2014-10-31 ENCOUNTER — Encounter: Payer: Self-pay | Admitting: Psychiatry

## 2014-10-31 VITALS — BP 140/90 | HR 124 | Temp 97.7°F | Ht 66.0 in | Wt 197.0 lb

## 2014-11-01 ENCOUNTER — Encounter: Payer: Self-pay | Admitting: Psychiatry

## 2014-11-01 ENCOUNTER — Ambulatory Visit (INDEPENDENT_AMBULATORY_CARE_PROVIDER_SITE_OTHER): Payer: Commercial Indemnity | Admitting: Psychiatry

## 2014-11-01 VITALS — BP 142/96 | HR 92 | Temp 98.0°F | Ht 66.0 in | Wt 199.8 lb

## 2014-11-01 DIAGNOSIS — F3132 Bipolar disorder, current episode depressed, moderate: Secondary | ICD-10-CM

## 2014-11-01 DIAGNOSIS — F909 Attention-deficit hyperactivity disorder, unspecified type: Secondary | ICD-10-CM

## 2014-11-01 DIAGNOSIS — F988 Other specified behavioral and emotional disorders with onset usually occurring in childhood and adolescence: Secondary | ICD-10-CM

## 2014-11-01 DIAGNOSIS — F411 Generalized anxiety disorder: Secondary | ICD-10-CM

## 2014-11-01 MED ORDER — DULOXETINE HCL 30 MG PO CPEP: ORAL_CAPSULE | ORAL | Status: DC

## 2014-11-01 NOTE — Progress Notes (Signed)
Psychiatric Initial Adult Assessment   Patient Identification: [name] MRN:  [id number] Date of Evaluation:  11/01/2014 Referral Source: Self Chief Complaint:  "You're closer." Chief Complaint    Establish Care     Visit Diagnosis:    ICD-9-CM ICD-10-CM   1. ADD (attention deficit disorder) 314.00 F90.9   2. Moderate bipolar I disorder, current or most recent episode depressed, with mixed features ([place]) 296.52 F31.32   3. Generalized anxiety disorder 300.02 F41.1    Diagnosis:   Patient Active Problem List   Diagnosis Date Noted  . Moderate bipolar I disorder, current or most recent episode depressed, with mixed features ([place]) [F31.32] 09/07/2014  . Thoracic facet syndrome [M53.84] 08/31/2014  . Chondromalacia [M94.20] 07/20/2014  . Bipolar 1 disorder ([place]) [F31.9] 07/20/2014  . ADD (attention deficit disorder) [F90.9] 07/20/2014  . Generalized anxiety disorder [F41.1] 07/20/2014  . DJD (degenerative joint disease) of knee [M17.9] 07/19/2014  . Knee sprain and strain [S83.90XA] 07/05/2014  . DDD (degenerative disc disease), thoracic [M51.34] 07/03/2014  . Intercostal neuralgia [G54.8] 07/03/2014  . DDD (degenerative disc disease), lumbosacral [M51.37] 07/03/2014  . Lumbar facet joint pain [M54.5] 07/03/2014  . Chronic female pelvic pain [N94.9, G89.29] 10/07/2013  . Well adult exam [Z00.00] 10/01/2013  . Ovarian cyst, right [N83.201] 08/04/2013  . Painful sexual intercourse [IMO0002] 06/29/2013  . Chronic constipation [K59.00] 04/01/2013  . Narcolepsy [G47.419] 04/02/2012  . LBP (low back pain) [M54.5] 04/02/2012  . Depression [F32.9] 10/22/2010  . Eczema [L30.9] 05/31/2010  . Hyperglycemia [R73.9] 04/28/2010  . GERD [K21.9] 09/28/2009  . Migraine without aura [G43.009] 08/10/2009  . ARTHRALGIA [M25.50] 08/10/2009  . INSOMNIA-SLEEP DISORDER-UNSPEC [G47.00] 08/10/2009  . FATIGUE [R53.81, R53.83] 05/02/2009  . SMOKER [F17.200] 11/14/2008  . ABSCESS, SUPRAPUBIC  [L03.319, B93.903] 09/08/2008  . ADD [F98.8] 10/08/2007  . PARESTHESIA [R20.9] 10/08/2007  . WEIGHT GAIN [R63.5] 02/06/2007  . Vitamin D deficiency [E55.9] 12/16/2006  . Anxiety state [F41.1] 11/12/2006  . Essential hypertension [I10] 11/12/2006  . Asthma [J45.909] 11/12/2006   History of Present Illness:  Patient did see Dr. Adele Schilder at [facility] for an initial evaluation in January of this year. She saw him for several follow-up appointments. However geographically she is closer to this office and thus she is transitioning care for that reason.  Huston Foley that her mood issues began after her son was born in 50. She states also at that time she was working in a [employer] in 1996. States the schedule was 7 days a week. She states she eventually had a breakdown and she was off from work for 3 months. She states at that time anxiety and depression are causing issues and she saw a psychiatrist. She states that she eventually got on medications and was functioning well until 2011 in which she started that have stress from work by working in [place]. She states there was some apical issue she was struggling with because she states she reports she was instructed to deny some people FMLA who were deserving of it. She states she had that point had issues with depression and elevated mood. She describes her depressive states as difficulty sleeping, anhedonia, low energy, depressed mood. She states that at one point she developed suicidal ideation in 2013 but never made any attempts or plans at that time or any other time. If that she will her worse episode of depression was in 2013. She states typically her depressive episodes can last 2 months with one week of extreme depression. She states  also at times she would have elevated mood that might last 2 weeks. She states that she would spend without fear of the consequences, she would be grandiose, decreased need for sleep and she would talk fast. She eventually  started getting better after the 2013 episode. She states in 2013 she lost her insurance and then her Asian management was provided by a free clinic which should provide her with antidepressant medications and her primary care would provide her with her stimulants and mood stabilizers. She states is the case up until she saw Dr. Adele Schilder in January 2016.  He continued her on Focalin X are, Cogentin 1 mg daily, Abilify 30 mg daily and Valium 5 mg 1-2 tablets as needed. He noted she was been getting her nortriptyline and Cymbalta from her primary care physician. At a did make a change from the Focalin to Vyvanse at a follow-up appointment. Elements:  Duration:  As noted above. Associated Signs/Symptoms: Depression Symptoms:  depressed mood, anhedonia, insomnia, fatigue, anxiety, loss of energy/fatigue, (Hypo) Manic Symptoms:  Elevated Mood, Grandiosity, Impulsivity, Pressured speech Anxiety Symptoms:  Excessive Worry, Psychotic Symptoms:  He stated that she may have heard some whispers during her depressive episode in 2013. She denies any since then and denies ever having visual hallucinations. PTSD Symptoms: Negative  Past Medical History:  Past Medical History  Diagnosis Date  . Attention deficit disorder without mention of hyperactivity   . Anxiety state, unspecified   . Pain in joint, site unspecified   . Unspecified asthma(493.90)   . Migraine without aura, without mention of intractable migraine without mention of status migrainosus   . Depressive disorder, not elsewhere classified   . Unspecified essential hypertension   . Unspecified vitamin D deficiency   . Hypertension   . Esophageal reflux     Past Surgical History  Procedure Laterality Date  . Partial hysterectomy  2007  . Tonsillectomy  04/29/00  . Incise and drain abcess  2011    Right axilla- Dr. Barkley Bruns  . Abdominal ultrasound  10/12/97  . Plantar fascia surgery  2000  . Transthoracic echocardiogram  11/2009     EF=>55%; trace MR & TR  . Nm myocar perf wall motion  11/2009    bruce myoview; perfusion defect in anterior region consistent with breast attenuation; remaining myocardium with normal perfusion; post-stress EF 74%; low risk scan   . Appendectomy  05/27/2013    gangrenous  . Abdominal hysterectomy    . Tonsillectomy Bilateral    Family History:  Family History  Problem Relation Age of Onset  . Asthma      Family history  . Coronary artery disease      1st degree female < 50  . Diabetes      1st degree relative  . Breast cancer Mother   . Multiple sclerosis Mother   . Cancer Mother 38    breast ca  . Depression Mother   . Lupus Sister     PTSD  . Alcohol abuse Sister   . Bipolar disorder Sister   . Alcohol abuse Brother     also HTN, lupus, enlarged heart  . Heart disease Father     cardiac arrest  . Heart disease Maternal Grandfather     cardiac arrest  . COPD Paternal Grandmother   . Diabetes Paternal Grandfather   . Heart disease Paternal Grandfather   . ADD / ADHD Child   . Asthma Child    Social History:   Social History  Social History  . Marital Status: Married    Spouse Name: N/A  . Number of Children: 2  . Years of Education: N/A   Occupational History  . 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 Smoking status: Current Every Day Smoker -- 1.00 packs/day for 27 years    Types: Cigarettes    Start date: 10/31/1986  . Smokeless tobacco: Never Used  . Alcohol Use: No  . Drug Use: No  . Sexual Activity: Yes    Birth Control/ Protection: None   Other Topics Concern  . None   Social History Narrative   Patient lives at home with her mother.   Disabled   Right handed   Caffeine three cups daily   Some college education   Additional Social History: Patient states that her childhood was somewhat chaotic. States that her mom left the house when they were 38. States her mother  and father argued and so both became heavily invested in their work environments and would not come home until it was bedtime. She states she and her siblings just spent time with other relatives during the day and return home at bedtime. She has 2 older 40 brothers and 1 older sister.  She indicates that there is family history of mental health issues. She states that she has a paternal cousin that committed suicide. She states that her mother isn't, on and grandmother have been treated for depression and anxiety. She states that her brother and sister are also treated for depression and anxiety.  Patient stated that she went to several different high schools. She states the reason for this is because her mother moved around secondary to changes in boyfriends. She states that she eventually left school in the 10th grade but then got her GED at age 46. 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  Patient has been married for 24 years but does discuss that there been some separations on and off. She has 2 children ages 47 and 31. She states she is also raising a nephew.  Musculoskeletal: Strength & Muscle Tone: within normal limits Gait & Station: normal Patient leans: N/A  Psychiatric Specialty Exam: HPI  Review of Systems  Psychiatric/Behavioral: Negative for depression, suicidal ideas, hallucinations, memory loss and substance abuse. The patient is nervous/anxious and has insomnia.   All other systems reviewed and are negative.   Blood pressure 142/96, pulse 92, temperature 98 F (36.7 C), temperature source Tympanic, height 5\' 6"  (1.676 m), weight 199 lb 12.8 oz (90.629 kg), SpO2 96 %.Body mass index is 32.26 kg/(m^2).  General  Appearance: Neat  Eye Contact:  Good  Speech:  Normal Rate  Volume:  Normal  Mood:  Good  Affect:  Appropriate  Thought Process:  Linear and Logical  Orientation:  Full (Time, Place, and Person)  Thought Content:  Negative  Suicidal Thoughts:  No  Homicidal Thoughts:  No  Memory:  Immediate;   Good Recent;   Good Remote;   Good  Judgement:  Good  Insight:  Good  Psychomotor Activity:  Negative  Concentration:  Good  Recall:  Good  Fund of Knowledge:Good  Language: Good  Akathisia:  Negative  Handed:  Right unknown   AIMS (if indicated):  Not done today  Assets:  Communication Skills Desire for Improvement Social Support  ADL's:  Intact  Cognition: WNL  Sleep:  fair   Is the patient at risk to self?  No. Has the patient been a risk to self in the past 6 months?  No. Has the patient been a risk to self within the distant past?  No. Is the patient a risk to others?  No. Has the patient been a risk to others in the past 6 months?  No. Has the patient been a risk to others within the distant past?  No.  Allergies:   Allergies  Allergen Reactions  . Amoxicillin Itching    REACTION: QUESTIONABLE  . Doxycycline Nausea And Vomiting    REACTION: vomiting  . Hydrochlorothiazide     REACTION: CRAMPS AT HIGHER DOSAGES  . Lisinopril     Other reaction(s): Cough (finding) REACTION: COUGH  . Penicillins   . Percocet [Oxycodone-Acetaminophen] Itching    itching  . Talwin [Pentazocine] Nausea And Vomiting    n/v   Current Medications: Current Outpatient Prescriptions  Medication Sig Dispense Refill  . albuterol (PROAIR HFA) 108 (90 BASE) MCG/ACT inhaler Inhale 2 puffs into the lungs every 4 (four) hours as needed. For shortness of breath. 18 g 2  . albuterol (PROVENTIL) (2.5 MG/3ML) 0.083% nebulizer solution Take 3 mLs (2.5 mg total) by nebulization every 6 (six) hours as needed. For shortness of breath. 75 mL 0  . ARIPiprazole (ABILIFY) 30 MG tablet Take one tablet by  mouth one time daily 30 tablet 2  . aspirin (BAYER ASPIRIN) 325 MG tablet Take 1 tablet (325 mg total) by mouth daily. 100 tablet 3  . benztropine (COGENTIN) 1 MG tablet Take 1 tablet (1 mg total) by mouth at bedtime. 30 tablet 2  . budesonide-formoterol (SYMBICORT) 160-4.5 MCG/ACT inhaler Inhale 2 puffs into the lungs 2 (two) times daily. 1 Inhaler 2  . diazepam (VALIUM) 5 MG tablet Take one tab daily and 2 nd if needed 45 tablet 2  . diclofenac sodium (VOLTAREN) 1 % GEL Apply 2-4 mg to painful area of skin 4 times per day if tolerated . Limit 4 grams per application 412 g 2  . DULoxetine (CYMBALTA) 30 MG capsule Two capsules in the morning, and one capsule in the evening. 90 capsule 3  . eletriptan (RELPAX) 40 MG tablet Take 1 tablet (40 mg total) by mouth as needed for migraine or headache. May repeat in 2 hours if headache persists or recurs. 15 tablet 6  . Linaclotide (LINZESS) 145 MCG CAPS capsule Take 1-2 capsules (145-290 mcg total) by mouth daily as needed. 60 capsule 11  . lisdexamfetamine (VYVANSE) 30 MG capsule Take 1 capsule (30 mg total) by mouth daily. 30 capsule 0  . meloxicam (MOBIC) 15 MG tablet Take 1 tablet (15 mg total) by mouth daily as needed for pain. 30 tablet 5  . metoprolol (LOPRESSOR) 50 MG tablet Take 50 mg by mouth daily.    . nicotine (NICODERM CQ) 14 mg/24hr patch Place 1 patch (14 mg total) onto the skin daily. 28 patch 0  . nicotine (NICODERM CQ) 21 mg/24hr patch Place 1 patch (21 mg total) onto the skin daily. 28 patch 0  . nicotine (NICODERM CQ) 7 mg/24hr patch Place 1 patch (7 mg total) onto the skin daily. 28 patch 0  .  nortriptyline (PAMELOR) 10 MG capsule 3 tabs by mouth at bedtime 90 capsule 5  . orphenadrine (NORFLEX) 100 MG tablet Limit 1 tab by mouth per day or twice per day if tolerated 60 tablet 2  . pantoprazole (PROTONIX) 40 MG tablet Take 1 tablet (40 mg total) by mouth daily. 90 tablet 3  . pregabalin (LYRICA) 50 MG capsule Take 1 capsule (50 mg  total) by mouth 3 (three) times daily. 90 capsule 1  . ranitidine (ZANTAC) 300 MG tablet Take 1 tablet (300 mg total) by mouth at bedtime. 90 tablet 3  . traMADol (ULTRAM) 50 MG tablet Limit 1 tablet by mouth 4 - 8  times per day if tolerated 240 tablet 0  . Vitamin D, Ergocalciferol, (DRISDOL) 50000 UNITS CAPS capsule Take 1 capsule (50,000 Units total) by mouth every 7 (seven) days. 8 capsule 0   No current facility-administered medications for this visit.    Previous Psychotropic Medications: Yes  Patient reports that she was on lithium in the past but states she did not like the way that medication made her feel. She's been on Focalin and Vyvanse which worked well until she lost her insurance. She states she's also been on to [facility] and Ambien. She has been on Seroquel but reports there was no improvement with that medication. Substance Abuse History in the last 12 months:  No. Patient states that she might get drunk twice a year but denies any regular alcoholic use. She smokes one pack of cigarettes a day since age 67. She states she did quit for 5 years and also quit during her pregnancies. She denies any use of illicit drugs. Consequences of Substance Abuse: NA  Medical Decision Making:  Established Problem, Stable/Improving (1), Review of Medication Regimen & Side Effects (2) and Review of New Medication or Change in Dosage (2)  Treatment Plan Summary: Medication management and Plan Bipolar disorder type II-we will continue her Abilify at 30 mg daily. She reports some issues with anxiety and thus we will increase her Cymbalta from 30 mg twice a day to 60 mg in the morning and 30 mg in the evening.  Generalized anxieties order-she states that her anxiety she does not feels under control and thus we'll increase the Cymbalta as noted above. Patient is prescribed Valium 5 mg 1-2 tablets daily as needed   ADHD-patient reports some benefit from the Vyvanse but wishes that it was stronger.  I did discuss with her that her blood pressure was elevated at this visit and was somewhat elevated at her last visit with Dr. Adele Schilder. As such I did not want to change the dose of this medication at this time. Encouraged her to talk with her primary care physician about any management around her blood pressure.  I spent time discussing with patient the polypharmacy and concerns about adding additional medications. Patient case she has an adequate supply of all her medications with the exception of the Cymbalta because we are changing the dose.  Patient  will follow up in 1 month. She will continue in therapy. She's been encouraged colony questions concerns prior to her next appointment.  In regards to risk assessment the patient has risk factors of affective illness and race. She has protective factors of no past suicide attempts, forward thinking, some social support, some benefit from medications, aND engage in treatment.      Faith Rogue 10/4/201610:01 AM

## 2014-11-03 DIAGNOSIS — M2242 Chondromalacia patellae, left knee: Secondary | ICD-10-CM | POA: Diagnosis not present

## 2014-11-07 ENCOUNTER — Encounter: Payer: Self-pay | Admitting: Internal Medicine

## 2014-11-08 ENCOUNTER — Other Ambulatory Visit: Payer: Self-pay | Admitting: Internal Medicine

## 2014-11-08 MED ORDER — FLUCONAZOLE 150 MG PO TABS: 150.0000 mg | ORAL_TABLET | Freq: Once | ORAL | Status: DC

## 2014-11-15 ENCOUNTER — Ambulatory Visit (INDEPENDENT_AMBULATORY_CARE_PROVIDER_SITE_OTHER): Payer: Commercial Indemnity | Admitting: Licensed Clinical Social Worker

## 2014-11-15 DIAGNOSIS — F909 Attention-deficit hyperactivity disorder, unspecified type: Secondary | ICD-10-CM | POA: Diagnosis not present

## 2014-11-15 DIAGNOSIS — F988 Other specified behavioral and emotional disorders with onset usually occurring in childhood and adolescence: Secondary | ICD-10-CM

## 2014-11-15 DIAGNOSIS — F3132 Bipolar disorder, current episode depressed, moderate: Secondary | ICD-10-CM | POA: Diagnosis not present

## 2014-11-15 DIAGNOSIS — F411 Generalized anxiety disorder: Secondary | ICD-10-CM | POA: Diagnosis not present

## 2014-11-15 NOTE — Progress Notes (Signed)
THERAPIST PROGRESS NOTE  Session Time: 9:10 a.m. - 10:10 a.m.  Participation Level: Active  Behavioral Response: DisheveledAlertAnxious and Depressed  Type of Therapy: Individual Therapy  Treatment Goals addressed: Anger, Anxiety and Coping  Interventions: CBT, Solution Focused, Strength-based, Supportive and Reframing  Summary: [name] is a 44 y.o. female with ongoing depression, decline in how she tends to her personal care, voiced sense of despair and isolation within her social network, including her family.  [name] returns to [facility] following outpatient knee surgery which involved shaving some areas of cartiledge and a bone spur and there were tears in her cartiledge. Had her surgery done at [facility], Dr. Rhona Raider.  The injections in her knees provided some pain relief.  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 Like this morning I wouldn't have been good to go anywhere. I wouldn't have gone to it if I did have a job."   Symptoms that interfere with her functioning and are linked to these comments include: mood swings where she can easily lose patience and become verbally aggressive; difficulty concentrating on task completion, short term memory problems, loss of interests/apathy, low energy level and physically being compromised due to her chronic pain.  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  She informed [name], "2013 was the worse year of my life and 2016 is an average year. I was a zombie and I didn't have hope. Now I have hope."  She continues to neglect personal hygiene upwards of 3 days per week and does not feel motivated most days or  either experiences being totally over-whelmed with what she describes as things that she has to do for her family.  Client able to see the connection between the feelings of being over-whelmed and her beliefs that she is the only one who can do these things which places her in a vulnerable position.  Things at home continue to be stressful most days with the mother's care needs and teen nephew in the home and needing things to finish high school.  [name] talked about pride in both of her sons who are working and youngest son is also going to college part time.  She voiced understanding of [name]'s gently challenging her to delegate and be more open to re-framing those beliefs that are irrational and create a state of high anxiety and irritability.    [name] expressed disappointment about [name]'s announcement of [name]'s resignation.  She accepted the letter informing of this and [name]'s last day and other OPT providers including [name] in this clinic.  [name] voiced interest to return to see [name] at least once more and will consider her options and inform [name] at our last therapy session.  Suicidal/Homicidal: Negativewithout intent/plan  Therapist Response:   Assessed client's functional status and offered psycho-education/review of strategies to build resiliency and increase motivation when living with depression.  Re-framed client's symptoms not as a sign of weakness, rather a chronic illness with risks of relapse like any other chronic illness.  CBT used to restructure thoughts with emphasis on helping client to manage emotions, including anger, ear and sadness that are realistic and understandable in context of diagnosis and  degree of functional losses.  Examined cognitions and used CBT to defuse thoughts of helplessness, negative forecasting and catastrophizing about symptoms and outcomes of these symptoms.  Empowered client to consider ways to emotionally and physically fill herself up and offered  permission to delegate and resign from the belief that she is in charge of her family as they are, with the exception of her mother in certain aspects, totally capable of caring for their own needs.  Urged to re-connect socially with people outside of her family.  [name] provided client with both written and verbal notice of resignation from [facility] with list of additional outpatient therapists in this practice and within the community.  [name] for trusting [name] with her personal struggles and the emotions and thoughts connected to these struggles and highlighted her strengths and resources.  Empowered client via positive affirmations to continue her efforts towards recovery and goal setting to maintain hope.    Plan: Return again in two weeks.  [name] will keep appointments and read therapy hand-outs and notify [name] of her decision regarding OPT sessions and the provider she chooses.  [name] will coordinate her care with Psychiatrist and continue to assess client's motivation and those barriers that are present and interfering with client's coping.  Diagnosis: BiPolar I, MRE, Depressed   ADHD   Generalized anxiety disorder  Melissa Dibble, [name] 11/15/2014

## 2014-11-16 ENCOUNTER — Encounter: Payer: Self-pay | Admitting: Pain Medicine

## 2014-11-17 ENCOUNTER — Ambulatory Visit: Payer: BLUE CROSS/BLUE SHIELD | Attending: Pain Medicine | Admitting: Pain Medicine

## 2014-11-17 ENCOUNTER — Encounter: Payer: Self-pay | Admitting: Pain Medicine

## 2014-11-17 VITALS — BP 148/89 | HR 90 | Temp 99.1°F | Ht 66.0 in | Wt 198.0 lb

## 2014-11-17 DIAGNOSIS — M79605 Pain in left leg: Secondary | ICD-10-CM

## 2014-11-17 DIAGNOSIS — R51 Headache: Secondary | ICD-10-CM | POA: Diagnosis present

## 2014-11-17 DIAGNOSIS — G588 Other specified mononeuropathies: Secondary | ICD-10-CM

## 2014-11-17 DIAGNOSIS — M47814 Spondylosis without myelopathy or radiculopathy, thoracic region: Secondary | ICD-10-CM | POA: Insufficient documentation

## 2014-11-17 DIAGNOSIS — M47816 Spondylosis without myelopathy or radiculopathy, lumbar region: Secondary | ICD-10-CM | POA: Diagnosis not present

## 2014-11-17 DIAGNOSIS — M170 Bilateral primary osteoarthritis of knee: Secondary | ICD-10-CM

## 2014-11-17 DIAGNOSIS — M172 Bilateral post-traumatic osteoarthritis of knee: Secondary | ICD-10-CM

## 2014-11-17 DIAGNOSIS — M5136 Other intervertebral disc degeneration, lumbar region: Secondary | ICD-10-CM | POA: Diagnosis not present

## 2014-11-17 DIAGNOSIS — M545 Low back pain: Secondary | ICD-10-CM

## 2014-11-17 DIAGNOSIS — M47894 Other spondylosis, thoracic region: Secondary | ICD-10-CM

## 2014-11-17 DIAGNOSIS — M533 Sacrococcygeal disorders, not elsewhere classified: Secondary | ICD-10-CM | POA: Diagnosis not present

## 2014-11-17 DIAGNOSIS — M5134 Other intervertebral disc degeneration, thoracic region: Secondary | ICD-10-CM

## 2014-11-17 DIAGNOSIS — M5137 Other intervertebral disc degeneration, lumbosacral region: Secondary | ICD-10-CM

## 2014-11-17 DIAGNOSIS — M5481 Occipital neuralgia: Secondary | ICD-10-CM | POA: Diagnosis not present

## 2014-11-17 DIAGNOSIS — M79604 Pain in right leg: Secondary | ICD-10-CM

## 2014-11-17 DIAGNOSIS — M542 Cervicalgia: Secondary | ICD-10-CM | POA: Diagnosis present

## 2014-11-17 DIAGNOSIS — M5459 Other low back pain: Secondary | ICD-10-CM

## 2014-11-17 MED ORDER — HYDROCODONE-ACETAMINOPHEN 5-325MG PREPACK (~~LOC~~: ORAL_TABLET | ORAL | Status: DC

## 2014-11-17 MED ORDER — TRAMADOL HCL 50 MG PO TABS: ORAL_TABLET | ORAL | Status: DC

## 2014-11-17 NOTE — Patient Instructions (Addendum)
PLAN   Continue present medication Norflex diclofenac gel and tramadol.  We will call Dr.Daldorf to request permission to refill your hydrocodone acetaminophen  Greater occipital nerve block to be performed at time return appointment   F/U PCP  Plotnikov for evaliation of  BP and general medical  condition  F/U surgical evaluation. Follow-up Dr.Daldorf as planned   F/U neurological evaluation. May consider pending follow-up evaluations  May consider radiofrequency rhizolysis or intraspinal procedures pending response to present treatment and F/U evaluation   Patient to [facility] should patient have concerns prior to scheduled return appointment. Occipital Nerve Block Patient Information  Description: The occipital nerves originate in the cervical (neck) spinal cord and travel upward through muscle and tissue to supply sensation to the back of the head and top of the scalp.  In addition, the nerves control some of the muscles of the scalp.  Occipital neuralgia is an irritation of these nerves which can cause headaches, numbness of the scalp, and neck discomfort.     The occipital nerve block will interrupt nerve transmission through these nerves and can relieve pain and spasm.  The block consists of insertion of a small needle under the skin in the back of the head to deposit local anesthetic (numbing medicine) and/or steroids around the nerve.  The entire block usually lasts less than 5 minutes.  Conditions which may be treated by occipital blocks:   Muscular pain and spasm of the scalp  Nerve irritation, back of the head  Headaches  Upper neck pain  Preparation for the injection:  1. Do not eat any solid food or dairy products within 6 hours of your appointment. 2. You may drink clear liquids up to 2 hours before appointment.  Clear liquids include water, black coffee, juice or soda.  No milk or cream please. 3. You may take your regular medication, including  pain medications, with a sip of water before you appointment.  Diabetics should hold regular insulin (if taken separately) and take 1/2 normal NPH dose the morning of the procedure.  Carry some sugar containing items with you to your appointment. 4. A driver must accompany you and be prepared to drive you home after your procedure. 5. Bring all your current medications with you. 6. An IV may be inserted and sedation may be given at the discretion of the physician. 7. A blood pressure cuff, EKG, and other monitors will often be applied during the procedure.  Some patients may need to have extra oxygen administered for a short period. 8. You will be asked to provide medical information, including your allergies and medications, prior to the procedure.  We must know immediately if you are taking blood thinners (like Coumadin/Warfarin) or if you are allergic to IV iodine contrast (dye).  We must know if you could possible be pregnant.  9. Do not wear a high collared shirt or turtleneck.  Tie long hair up in the back if possible.  Possible side-effects:   Bleeding from needle site  Infection (rare, may require surgery)  Nerve injury (rare)  Hair on back of neck can be tinged with iodine scrub (this will wash out)  Light-headedness (temporary)  Pain at injection site (several days)  Decreased blood pressure (rare, temporary)  Seizure (very rare)  Call if you experience:   Hives or difficulty breathing ( go to the emergency room)  Inflammation or drainage at the injection site(s)  Please note:  Although the local anesthetic injected can often  make your painful muscles or headache feel good for several hours after the injection, the pain may return.  It takes 3-7 days for steroids to work.  You may not notice any pain relief for at least one week.  If effective, we will often do a series of injections spaced 3-6 weeks apart to maximally decrease your pain.  If you have any questions,  please call [phone] [facility]  What are the risk, side effects and possible complications? Generally speaking, most procedures are safe.  However, with any procedure there are risks, side effects, and the possibility of complications.  The risks and complications are dependent upon the sites that are lesioned, or the type of nerve block to be performed.  The closer the procedure is to the spine, the more serious the risks are.  Great care is taken when placing the radio frequency needles, block needles or lesioning probes, but sometimes complications can occur. 1. Infection: Any time there is an injection through the skin, there is a risk of infection.  This is why sterile conditions are used for these blocks.  There are four possible types of infection. 1. Localized skin infection. 2. Central Nervous System Infection-This can be in the form of Meningitis, which can be deadly. 3. Epidural Infections-This can be in the form of an epidural abscess, which can cause pressure inside of the spine, causing compression of the spinal cord with subsequent paralysis. This would require an emergency surgery to decompress, and there are no guarantees that the patient would recover from the paralysis. 4. Discitis-This is an infection of the intervertebral discs.  It occurs in about 1% of discography procedures.  It is difficult to treat and it may lead to surgery.        2. Pain: the needles have to go through skin and soft tissues, will cause soreness.       3. Damage to internal structures:  The nerves to be lesioned may be near blood vessels or    other nerves which can be potentially damaged.       4. Bleeding: Bleeding is more common if the patient is taking blood thinners such as  aspirin, Coumadin, Ticiid, Plavix, etc., or if he/she have some genetic predisposition  such as hemophilia. Bleeding into the spinal canal can cause  compression of the spinal  cord with subsequent paralysis.  This would require an emergency surgery to  decompress and there are no guarantees that the patient would recover from the  paralysis.       5. Pneumothorax:  Puncturing of a lung is a possibility, every time a needle is introduced in  the area of the chest or upper back.  Pneumothorax refers to free air around the  collapsed lung(s), inside of the thoracic cavity (chest cavity).  Another two possible  complications related to a similar event would include: Hemothorax and Chylothorax.   These are variations of the Pneumothorax, where instead of air around the collapsed  lung(s), you may have blood or chyle, respectively.       6. Spinal headaches: They may occur with any procedures in the area of the spine.       7. Persistent CSF (Cerebro-Spinal Fluid) leakage: This is a rare problem, but may occur  with prolonged intrathecal or epidural catheters either due to the formation of a fistulous  track or a dural tear.       8. Nerve  damage: By working so close to the spinal cord, there is always a possibility of  nerve damage, which could be as serious as a permanent spinal cord injury with  paralysis.       9. Death:  Although rare, severe deadly allergic reactions known as "Anaphylactic  reaction" can occur to any of the medications used.      10. Worsening of the symptoms:  We can always make thing worse.  What are the chances of something like this happening? Chances of any of this occuring are extremely low.  By statistics, you have more of a chance of getting killed in a motor vehicle accident: while driving to the hospital than any of the above occurring .  Nevertheless, you should be aware that they are possibilities.  In general, it is similar to taking a shower.  Everybody knows that you can slip, hit your head and get killed.  Does that mean that you should not shower again?  Nevertheless always keep in mind that statistics do not mean  anything if you happen to be on the wrong side of them.  Even if a procedure has a 1 (one) in a 1,000,000 (million) chance of going wrong, it you happen to be that one..Also, keep in mind that by statistics, you have more of a chance of having something go wrong when taking medications.  Who should not have this procedure? If you are on a blood thinning medication (e.g. Coumadin, Plavix, see list of "Blood Thinners"), or if you have an active infection going on, you should not have the procedure.  If you are taking any blood thinners, please inform your physician.  How should I prepare for this procedure?  Do not eat or drink anything at least six hours prior to the procedure.  Bring a driver with you .  It cannot be a taxi.  Come accompanied by an adult that can drive you back, and that is strong enough to help you if your legs get weak or numb from the local anesthetic.  Take all of your medicines the morning of the procedure with just enough water to swallow them.  If you have diabetes, make sure that you are scheduled to have your procedure done first thing in the morning, whenever possible.  If you have diabetes, take only half of your insulin dose and notify our nurse that you have done so as soon as you arrive at the clinic.  If you are diabetic, but only take blood sugar pills (oral hypoglycemic), then do not take them on the morning of your procedure.  You may take them after you have had the procedure.  Do not take aspirin or any aspirin-containing medications, at least eleven (11) days prior to the procedure.  They may prolong bleeding.  Wear loose fitting clothing that may be easy to take off and that you would not mind if it got stained with Betadine or blood.  Do not wear any jewelry or perfume  Remove any nail coloring.  It will interfere with some of our monitoring equipment.  NOTE: Remember that this is not meant to be interpreted as a complete list of all possible  complications.  Unforeseen problems may occur.  BLOOD THINNERS The following drugs contain aspirin or other products, which can cause increased bleeding during surgery and should not be taken for 2 weeks prior to and 1 week after surgery.  If you should need take something for relief of minor pain, you  may take acetaminophen which is found in Tylenol,m Datril, Anacin-3 and Panadol. It is not blood thinner. The products listed below are.  Do not take any of the products listed below in addition to any listed on your instruction sheet.  A.P.C or A.P.C with Codeine Codeine Phosphate Capsules #3 Ibuprofen Ridaura  ABC compound Congesprin Imuran rimadil  Advil Cope Indocin Robaxisal  Alka-Seltzer Effervescent Pain Reliever and Antacid Coricidin or Coricidin-D  Indomethacin Rufen  Alka-Seltzer plus Cold Medicine Cosprin Ketoprofen S-A-C Tablets  Anacin Analgesic Tablets or Capsules Coumadin Korlgesic Salflex  Anacin Extra Strength Analgesic tablets or capsules CP-2 Tablets Lanoril Salicylate  Anaprox Cuprimine Capsules Levenox Salocol  Anexsia-D Dalteparin Magan Salsalate  Anodynos Darvon compound Magnesium Salicylate Sine-off  Ansaid Dasin Capsules Magsal Sodium Salicylate  Anturane Depen Capsules Marnal Soma  APF Arthritis pain formula Dewitt's Pills Measurin Stanback  Argesic Dia-Gesic Meclofenamic Sulfinpyrazone  Arthritis Bayer Timed Release Aspirin Diclofenac Meclomen Sulindac  Arthritis pain formula Anacin Dicumarol Medipren Supac  Analgesic (Safety coated) Arthralgen Diffunasal Mefanamic Suprofen  Arthritis Strength Bufferin Dihydrocodeine Mepro Compound Suprol  Arthropan liquid Dopirydamole Methcarbomol with Aspirin Synalgos  ASA tablets/Enseals Disalcid Micrainin Tagament  Ascriptin Doan's Midol Talwin  Ascriptin A/D Dolene Mobidin Tanderil  Ascriptin Extra Strength Dolobid Moblgesic Ticlid  Ascriptin with Codeine Doloprin or Doloprin with Codeine Momentum Tolectin  Asperbuf Duoprin  Mono-gesic Trendar  Aspergum Duradyne Motrin or Motrin IB Triminicin  Aspirin plain, buffered or enteric coated Durasal Myochrisine Trigesic  Aspirin Suppositories Easprin Nalfon Trillsate  Aspirin with Codeine Ecotrin Regular or Extra Strength Naprosyn Uracel  Atromid-S Efficin Naproxen Ursinus  Auranofin Capsules Elmiron Neocylate Vanquish  Axotal Emagrin Norgesic Verin  Azathioprine Empirin or Empirin with Codeine Normiflo Vitamin E  Azolid Emprazil Nuprin Voltaren  Bayer Aspirin plain, buffered or children's or timed BC Tablets or powders Encaprin Orgaran Warfarin Sodium  Buff-a-Comp Enoxaparin Orudis Zorpin  Buff-a-Comp with Codeine Equegesic Os-Cal-Gesic   Buffaprin Excedrin plain, buffered or Extra Strength Oxalid   Bufferin Arthritis Strength Feldene Oxphenbutazone   Bufferin plain or Extra Strength Feldene Capsules Oxycodone with Aspirin   Bufferin with Codeine Fenoprofen Fenoprofen Pabalate or Pabalate-SF   Buffets II Flogesic Panagesic   Buffinol plain or Extra Strength Florinal or Florinal with Codeine Panwarfarin   Buf-Tabs Flurbiprofen Penicillamine   Butalbital Compound Four-way cold tablets Penicillin   Butazolidin Fragmin Pepto-Bismol   Carbenicillin Geminisyn Percodan   Carna Arthritis Reliever Geopen Persantine   Carprofen Gold's salt Persistin   Chloramphenicol Goody's Phenylbutazone   Chloromycetin Haltrain Piroxlcam   Clmetidine heparin Plaquenil   Cllnoril Hyco-pap Ponstel   Clofibrate Hydroxy chloroquine Propoxyphen         Before stopping any of these medications, be sure to consult the physician who ordered them.  Some, such as Coumadin (Warfarin) are ordered to prevent or treat serious conditions such as "deep thrombosis", "pumonary embolisms", and other heart problems.  The amount of time that you may need off of the medication may also vary with the medication and the reason for which you were taking it.  If you are taking any of these medications, please  make sure you notify your pain physician before you undergo any procedures.

## 2014-11-17 NOTE — Progress Notes (Signed)
Safety precautions to be maintained throughout the outpatient stay will include: orient to surroundings, keep bed in low position, maintain call bell within reach at all times, provide assistance with transfer out of bed and ambulation. Dr. Latanya Maudlin office called times two this  am to get permission for Dr, Primus Bravo to prescribe post surgery meds for pt. Pt on hydrocodne 5/325mg   No. 40 from Dr. Latanya Maudlin. Lorrie called again thsi pm and left another message for them to prescribe meds of give permission for Dr. Primus Bravo to prescribe hydrocodone.

## 2014-11-17 NOTE — Progress Notes (Signed)
Subjective:    Patient ID: [name], female    DOB: Jul 25, 1970, 44 y.o.   MRN: [id number]  HPI  Patient is 44 year old female who returns to [facility] for further evaluation and treatment of pain involving the neck pain of the neck radiating to the back of the head causing headaches. Patient recently underwent  Treatment of the left and right knees by Dr.Daldorf . The patient states that the pain occurring in the knees continues to be rather significant. We informed patient that we would refill her hydrocodone acetaminophen (5/325 mg) quantity of 40 if we received consent from her surgeon. At the present time patient's stated that she has significant pain occurring the back of the neck causing headaches. We will proceed with greater occipital nerve blocks at time return appointment in attempt to decrease severity of symptoms, minimize progression of symptoms, and avoid need for block treatment. The patient was with understanding and in agreement status treatment plan      Review of Systems     Objective:   Physical Exam  There was moderate to moderate severe tenderness of the splenius capitis and occipitalis musculature regions. Palpation of these regions reproduced moderately severe discomfort. There were no new masses of the head and neck noted. Palpation of the acromial clavicular and glenohumeral joint region reproduced mild discomfort. Patient appeared to be with bilaterally equal grip strength Tinel and Phalen's maneuver were without increase of pain significantly. He was tenderness over the thoracic facet thoracic paraspinal musculature region of mild to moderate degree. No crepitus of the thoracic region was noted. Palpation over the lumbar paraspinal muscles lumbar facet region associated with mild to moderate discomfort. Lateral bending and rotation and extension to palpation of the lumbar facets reproduce mild to moderate discomfort. Straight leg raising was  tolerated to approximately 20 without increased pain with dorsiflexion noted. There were well-healed scars of both the left and right knees noted no increased warmth or erythema of the knees were noted no definite ballottement of the patella was noted and there was negative anterior and posterior drawer signs there was increased pain with range of motion maneuvers of the knees. EHL strength appeared to be decreased. No sensory deficit of dermatomal distribution of the lower extremities noted. There was negative clonus negative Homans. Palpation of the PSIS and PII S regions reproduced mild discomfort. There was mild tinnitus of the greater trochanteric region iliotibial band region. There was negative clonus negative Homans abdomen nontender with no costovertebral tenderness noted. Predominant portion of patient's pain reproduce palpation of the splenius capitis and occipitalis regions with radiation of pain from the splenius capitis and occipitalis musculature region precipitating headaches     Assessment & Plan:   Bilateral occipital neuralgia  Degenerative disc disease of the thoracic spine Degenerative changes thoracic spine is with most significant involvement T8-T9 with chronic endplate changes and height loss of the vertebral bodies of the mid and lower thoracic spine region, posterior disc bulging seen in the lower thoracic region.  Thoracic facet syndrome  Intercostal neuralgia with severe muscle spasms  Degenerative joint disease of knees  Degenerative disc disease lumbar spine Degenerative changes L3-4, L4-5, and L5-S1, degenerative changes  Sacroiliac joint dysfunction     PLAN   Continue present medications tramadol Voltaren gel and Norflex. We also informed patient that we will prescribed the hydrocodone acetaminophen 5/325 quantity of 40 with Dr.Daldorf we give [place] permission to do such  Bilateral greater occipital nerve blocks to be  performed at time return  appointment  F/U PCP  Dr.Plotnikov for evaliation of  BP and general medical  condition  F/U surgical evaluation as planned  F/U neurological evaluation. May consider pending follow-up evaluations  May consider radiofrequency rhizolysis or intraspinal procedures pending response to present treatment and F/U evaluation   Patient to call [facility] should patient have concerns prior to scheduled return appointment.   Degenerative joint disease of knees

## 2014-11-18 ENCOUNTER — Telehealth: Payer: Self-pay | Admitting: *Deleted

## 2014-11-18 NOTE — Telephone Encounter (Signed)
Called patient, advised that we did speak with her orthopedic MD at [facility].  The PA has approved Dr Primus Bravo prescribing Vicodin 5-325 mg tabs qty 40  NR and this information was relayed to the orthopedic office.  [name] is coming to pick up Rx within the next 20 minutes.   11/18/2014 @ 1109    Dr Primus Bravo notified of this action via telephone on 11/18/2014 @1111 .  Hart Rochester RN

## 2014-11-21 NOTE — Progress Notes (Signed)
[facility] MD Progress Note    Note In Error.Marland KitchenMarland KitchenPlease Disregard.       ROS  Blood pressure 140/90, pulse 124, temperature 97.7 F (36.5 C), temperature source Tympanic, height 5\' 6"  (1.676 m), weight 197 lb (89.359 kg), SpO2 96 %.Body mass index is 31.81 kg/(m^2).                                                          Rainey Pines 11/21/2014, 12:07 PM  This encounter was created in error - please disregard.

## 2014-11-22 ENCOUNTER — Ambulatory Visit (INDEPENDENT_AMBULATORY_CARE_PROVIDER_SITE_OTHER): Payer: BLUE CROSS/BLUE SHIELD | Admitting: Internal Medicine

## 2014-11-22 ENCOUNTER — Ambulatory Visit: Payer: Medicare Other | Admitting: Nurse Practitioner

## 2014-11-22 ENCOUNTER — Encounter: Payer: Self-pay | Admitting: Internal Medicine

## 2014-11-22 VITALS — BP 128/84 | HR 107 | Wt 202.0 lb

## 2014-11-22 DIAGNOSIS — F411 Generalized anxiety disorder: Secondary | ICD-10-CM

## 2014-11-22 DIAGNOSIS — G43011 Migraine without aura, intractable, with status migrainosus: Secondary | ICD-10-CM

## 2014-11-22 DIAGNOSIS — F988 Other specified behavioral and emotional disorders with onset usually occurring in childhood and adolescence: Secondary | ICD-10-CM

## 2014-11-22 DIAGNOSIS — F909 Attention-deficit hyperactivity disorder, unspecified type: Secondary | ICD-10-CM

## 2014-11-22 DIAGNOSIS — F319 Bipolar disorder, unspecified: Secondary | ICD-10-CM

## 2014-11-22 DIAGNOSIS — D751 Secondary polycythemia: Secondary | ICD-10-CM

## 2014-11-22 DIAGNOSIS — I10 Essential (primary) hypertension: Secondary | ICD-10-CM

## 2014-11-22 MED ORDER — METOPROLOL TARTRATE 100 MG PO TABS: 100.0000 mg | ORAL_TABLET | Freq: Two times a day (BID) | ORAL | Status: DC

## 2014-11-22 MED ORDER — NORTRIPTYLINE HCL 25 MG PO CAPS: 25.0000 mg | ORAL_CAPSULE | Freq: Every day | ORAL | Status: DC

## 2014-11-22 NOTE — Assessment & Plan Note (Signed)
Dr Jimmye Norman On [facility]

## 2014-11-22 NOTE — Assessment & Plan Note (Signed)
Dr Jimmye Norman

## 2014-11-22 NOTE — Progress Notes (Signed)
Pre visit review using our clinic review tool, if applicable. No additional management support is needed unless otherwise documented below in the visit note. 

## 2014-11-22 NOTE — Progress Notes (Signed)
Subjective:  Patient ID: [name], female    DOB: 06/07/1970  Age: 44 y.o. MRN: [id number]  CC: No chief complaint on file.   HPI [name] presents for elevated BP (nl BP at home)  Outpatient Prescriptions Prior to Visit  Medication Sig Dispense Refill  . albuterol (PROAIR HFA) 108 (90 BASE) MCG/ACT inhaler Inhale 2 puffs into the lungs every 4 (four) hours as needed. For shortness of breath. 18 g 2  . albuterol (PROVENTIL) (2.5 MG/3ML) 0.083% nebulizer solution Take 3 mLs (2.5 mg total) by nebulization every 6 (six) hours as needed. For shortness of breath. 75 mL 0  . ARIPiprazole (ABILIFY) 30 MG tablet Take one tablet by mouth one time daily 30 tablet 2  . aspirin (BAYER ASPIRIN) 325 MG tablet Take 1 tablet (325 mg total) by mouth daily. 100 tablet 3  . benztropine (COGENTIN) 1 MG tablet Take 1 tablet (1 mg total) by mouth at bedtime. 30 tablet 2  . budesonide-formoterol (SYMBICORT) 160-4.5 MCG/ACT inhaler Inhale 2 puffs into the lungs 2 (two) times daily. 1 Inhaler 2  . diazepam (VALIUM) 5 MG tablet Take one tab daily and 2 nd if needed 45 tablet 2  . diclofenac sodium (VOLTAREN) 1 % GEL Apply 2-4 mg to painful area of skin 4 times per day if tolerated . Limit 4 grams per application 073 g 2  . DULoxetine (CYMBALTA) 30 MG capsule Two capsules in the morning, and one capsule in the evening. 90 capsule 3  . eletriptan (RELPAX) 40 MG tablet Take 1 tablet (40 mg total) by mouth as needed for migraine or headache. May repeat in 2 hours if headache persists or recurs. 15 tablet 6  . HYDROcodone-acetaminophen (VICODIN) 5-325 mg TABS tablet Limit one tablet by mouth every 4-6 hours as needed for pain 40 tablet 0  . Linaclotide (LINZESS) 145 MCG CAPS capsule Take 1-2 capsules (145-290 mcg total) by mouth daily as needed. 60 capsule 11  . lisdexamfetamine (VYVANSE) 30 MG capsule Take 1 capsule (30 mg total) by mouth daily. 30 capsule 0  . meloxicam (MOBIC) 15 MG tablet Take 1 tablet  (15 mg total) by mouth daily as needed for pain. 30 tablet 5  . nicotine (NICODERM CQ) 14 mg/24hr patch Place 1 patch (14 mg total) onto the skin daily. 28 patch 0  . orphenadrine (NORFLEX) 100 MG tablet Limit 1 tab by mouth per day or twice per day if tolerated 60 tablet 2  . pantoprazole (PROTONIX) 40 MG tablet Take 1 tablet (40 mg total) by mouth daily. 90 tablet 3  . pregabalin (LYRICA) 50 MG capsule Take 1 capsule (50 mg total) by mouth 3 (three) times daily. 90 capsule 1  . ranitidine (ZANTAC) 300 MG tablet Take 1 tablet (300 mg total) by mouth at bedtime. 90 tablet 3  . traMADol (ULTRAM) 50 MG tablet Limit 1 tablet by mouth 4 - 8  times per day if tolerated 240 tablet 0  . Vitamin D, Ergocalciferol, (DRISDOL) 50000 UNITS CAPS capsule Take 1 capsule (50,000 Units total) by mouth every 7 (seven) days. 8 capsule 0  . fluconazole (DIFLUCAN) 150 MG tablet Take 1 tablet (150 mg total) by mouth once. 1 tablet 1  . HYDROcodone-acetaminophen (NORCO/VICODIN) 5-325 MG tablet Take 1 tablet by mouth every 6 (six) hours as needed for moderate pain.    . metoprolol (LOPRESSOR) 50 MG tablet Take 50 mg by mouth daily.    . nicotine (NICODERM CQ) 21 mg/24hr  patch Place 1 patch (21 mg total) onto the skin daily. 28 patch 0  . nicotine (NICODERM CQ) 7 mg/24hr patch Place 1 patch (7 mg total) onto the skin daily. 28 patch 0  . nortriptyline (PAMELOR) 10 MG capsule 3 tabs by mouth at bedtime 90 capsule 5   No facility-administered medications prior to visit.    ROS Review of Systems  Constitutional: Positive for fatigue. Negative for chills, activity change, appetite change and unexpected weight change.  HENT: Negative for congestion, mouth sores and sinus pressure.   Eyes: Negative for visual disturbance.  Respiratory: Negative for cough and chest tightness.   Gastrointestinal: Negative for nausea and abdominal pain.  Genitourinary: Negative for frequency, difficulty urinating and vaginal pain.    Musculoskeletal: Positive for back pain and arthralgias. Negative for gait problem.  Skin: Negative for pallor and rash.  Neurological: Negative for dizziness, tremors, weakness, numbness and headaches.  Psychiatric/Behavioral: Positive for sleep disturbance and decreased concentration. Negative for confusion. The patient is nervous/anxious.     Objective:  BP 128/84 mmHg  Pulse 107  Wt 202 lb (91.627 kg)  SpO2 98%  BP Readings from Last 3 Encounters:  11/22/14 128/84  11/17/14 148/89  11/01/14 142/96    Wt Readings from Last 3 Encounters:  11/22/14 202 lb (91.627 kg)  11/17/14 198 lb (89.812 kg)  11/01/14 199 lb 12.8 oz (90.629 kg)    Physical Exam  Constitutional: She appears well-developed. No distress.  HENT:  Head: Normocephalic.  Right Ear: External ear normal.  Left Ear: External ear normal.  Nose: Nose normal.  Mouth/Throat: Oropharynx is clear and moist.  Eyes: Conjunctivae are normal. Pupils are equal, round, and reactive to light. Right eye exhibits no discharge. Left eye exhibits no discharge.  Neck: Normal range of motion. Neck supple. No JVD present. No tracheal deviation present. No thyromegaly present.  Cardiovascular: Normal rate, regular rhythm and normal heart sounds.  Exam reveals no gallop.   Pulmonary/Chest: No stridor. No respiratory distress. She has no wheezes.  Abdominal: Soft. Bowel sounds are normal. She exhibits no distension and no mass. There is no tenderness. There is no rebound and no guarding.  Musculoskeletal: She exhibits no edema or tenderness.  Lymphadenopathy:    She has no cervical adenopathy.  Neurological: She displays normal reflexes. No cranial nerve deficit. She exhibits normal muscle tone. Coordination normal.  Skin: No rash noted. No erythema.  Psychiatric: She has a normal mood and affect. Her behavior is normal. Judgment and thought content normal.  tachycardic Obese  Lab Results  Component Value Date   WBC 11.9*  09/06/2014   HGB 14.1 09/06/2014   HCT 40.2 09/06/2014   PLT 252 09/06/2014   GLUCOSE 90 09/06/2014   CHOL 225* 10/01/2013   TRIG 98.0 10/01/2013   HDL 41.10 10/01/2013   LDLDIRECT 161.1 02/18/2013   LDLCALC 164* 10/01/2013   ALT 18 09/06/2014   AST 15 09/06/2014   NA 141 09/06/2014   K 4.5 09/06/2014   CL 101 08/17/2014   CREATININE 0.8 09/06/2014   BUN 13.1 09/06/2014   CO2 25 09/06/2014   TSH 1.83 10/01/2013   INR 1.05 08/29/2014   HGBA1C 5.5 08/17/2014    No results found.  Assessment & Plan:   Diagnoses and all orders for this visit:  Intractable migraine without aura and with status migrainosus  Essential hypertension  Generalized anxiety disorder  ADD (attention deficit disorder)  Bipolar 1 disorder ([place])  Polycythemia  Other orders -  metoprolol (LOPRESSOR) 100 MG tablet; Take 1 tablet (100 mg total) by mouth 2 (two) times daily. -     nortriptyline (PAMELOR) 25 MG capsule; Take 1 capsule (25 mg total) by mouth at bedtime.  I have discontinued Ms. Fristoe's metoprolol, nortriptyline, and fluconazole. I am also having her start on metoprolol and nortriptyline. Additionally, I am having her maintain her aspirin, pantoprazole, ranitidine, budesonide-formoterol, albuterol, albuterol, Vitamin D (Ergocalciferol), eletriptan, nicotine, Linaclotide, ARIPiprazole, benztropine, diazepam, lisdexamfetamine, diclofenac sodium, pregabalin, meloxicam, orphenadrine, DULoxetine, traMADol, and HYDROcodone-acetaminophen.  Meds ordered this encounter  Medications  . metoprolol (LOPRESSOR) 100 MG tablet    Sig: Take 1 tablet (100 mg total) by mouth 2 (two) times daily.    Dispense:  180 tablet    Refill:  3  . nortriptyline (PAMELOR) 25 MG capsule    Sig: Take 1 capsule (25 mg total) by mouth at bedtime.    Dispense:  90 capsule    Refill:  3     Follow-up: Return in about 3 months (around 02/22/2015).  Walker Kehr, MD

## 2014-11-22 NOTE — Assessment & Plan Note (Signed)
Nortriptyline 25 mg qhs

## 2014-11-22 NOTE — Assessment & Plan Note (Signed)
Increase Metoprolol 100 mg bid

## 2014-11-22 NOTE — Assessment & Plan Note (Signed)
2015 phlebotomies

## 2014-11-22 NOTE — Assessment & Plan Note (Signed)
Rx per Psychiatry - Dr Jimmye Norman On Abilify, Cymbalta, Cogentin

## 2014-11-24 ENCOUNTER — Encounter: Payer: Self-pay | Admitting: Nurse Practitioner

## 2014-11-24 ENCOUNTER — Ambulatory Visit (INDEPENDENT_AMBULATORY_CARE_PROVIDER_SITE_OTHER): Payer: BLUE CROSS/BLUE SHIELD | Admitting: Nurse Practitioner

## 2014-11-24 VITALS — BP 102/84 | HR 72 | Ht 66.0 in | Wt 201.4 lb

## 2014-11-24 DIAGNOSIS — G43011 Migraine without aura, intractable, with status migrainosus: Secondary | ICD-10-CM | POA: Diagnosis not present

## 2014-11-24 NOTE — Patient Instructions (Addendum)
Continue Relpax acutely Continue Nortriptyline at 25 mg daily as preventive Given list of migraine triggers F/U in 6 months

## 2014-11-24 NOTE — Progress Notes (Signed)
[facility]  PATIENT: [name] DOB: October 16, 1970   REASON FOR VISIT: follow-up for migraine HISTORY FROM:patient    HISTORY OF PRESENT ILLNESS:[name] 44 years old right-handed Caucasian female, referred to her optometrist Dr. Truman Hayward for evaluation of frequent headaches She had a past medical history of bipolar disorder, is on polypharmacy treatment, was treated with lithium in the past, she is now on polypharmacy treatment She was evaluated by optometrist Dr. Truman Hayward in February 18 2013, for her yearly eye evaluation, normal ocular examination, intraocular pressure 16 mm mercury in the right eye, 17 in the left eye, healthy discs, normal visual field testing, she reported frequent headaches, was referred to our clinic for evaluation She reported migraine all her life, getting worse since 2012, bilateral retro-orbital area severe pounding headaches was associated light noise sensitivity, nauseous, lasting for hours to 3 days, She was given nortriptyline 20 mg every night as migraine prevention, which has helped her 50%, she reported less headaches, less severe, it also helps her sleep, no significant side effect, She tried Imitrex in the past, does not help her headaches, she is taking diclofenac, and the phenergan as needed for her headaches Trigger for her headache, stress, sleep deprivations she is having headaches one to twice a month  UPDATE August 22 2014:Last clinical visit was February 2015, she continue have migraines, couple times each month, triggers for migraine are bright light, stress, each headaches last about 2-3 days, severe pounding headaches with associated light noise sensitivity, nauseous, she has tried Maxalt, which does ease up her headaches, but does not take it away completely, previous Toradol shots, Fioricet was helpful too Imitrex was not helpful UPDATE 11/24/2014. [name], 44 year old female returns for follow-up. She has a history of  migraines and had 3 migraines last week however she will she has had her contacts out and had not replaced them. Her blurred vision and straining she feels now was the reason for her headaches. She is due to to get trigger point injections on Monday. Her Pamelor was decreased to 25 mg by her primary care because she did not want to take 3 10 mg tablets. She returns today for follow-up.  REVIEW OF SYSTEMS: Full 14 system review of systems performed and notable only for those listed, all others are neg:  Constitutional: fatigue Cardiovascular: neg Ear/Nose/Throat: neg  Skin: neg Eyes: neg Respiratory: neg Gastroitestinal: neg  Hematology/Lymphatic: neg  Endocrine: neg Musculoskeletal:joint pain Allergy/Immunology: neg Neurological: headache Psychiatric: neg Sleep :insomnia   ALLERGIES: Allergies  Allergen Reactions  . Amoxicillin Itching    REACTION: QUESTIONABLE  . Ancef [Cefazolin] Itching  . Doxycycline Nausea And Vomiting    REACTION: vomiting  . Hydrochlorothiazide     REACTION: CRAMPS AT HIGHER DOSAGES  . Lisinopril     Other reaction(s): Cough (finding) REACTION: COUGH  . Penicillins   . Percocet [Oxycodone-Acetaminophen] Itching    itching  . Talwin [Pentazocine] Nausea And Vomiting    n/v    HOME MEDICATIONS: Outpatient Prescriptions Prior to Visit  Medication Sig Dispense Refill  . albuterol (PROAIR HFA) 108 (90 BASE) MCG/ACT inhaler Inhale 2 puffs into the lungs every 4 (four) hours as needed. For shortness of breath. 18 g 2  . albuterol (PROVENTIL) (2.5 MG/3ML) 0.083% nebulizer solution Take 3 mLs (2.5 mg total) by nebulization every 6 (six) hours as needed. For shortness of breath. 75 mL 0  . ARIPiprazole (ABILIFY) 30 MG tablet Take one tablet by mouth one time daily  30 tablet 2  . aspirin (BAYER ASPIRIN) 325 MG tablet Take 1 tablet (325 mg total) by mouth daily. 100 tablet 3  . benztropine (COGENTIN) 1 MG tablet Take 1 tablet (1 mg total) by mouth at bedtime.  30 tablet 2  . budesonide-formoterol (SYMBICORT) 160-4.5 MCG/ACT inhaler Inhale 2 puffs into the lungs 2 (two) times daily. 1 Inhaler 2  . diazepam (VALIUM) 5 MG tablet Take one tab daily and 2 nd if needed 45 tablet 2  . diclofenac sodium (VOLTAREN) 1 % GEL Apply 2-4 mg to painful area of skin 4 times per day if tolerated . Limit 4 grams per application 664 g 2  . DULoxetine (CYMBALTA) 30 MG capsule Two capsules in the morning, and one capsule in the evening. 90 capsule 3  . eletriptan (RELPAX) 40 MG tablet Take 1 tablet (40 mg total) by mouth as needed for migraine or headache. May repeat in 2 hours if headache persists or recurs. 15 tablet 6  . HYDROcodone-acetaminophen (VICODIN) 5-325 mg TABS tablet Limit one tablet by mouth every 4-6 hours as needed for pain 40 tablet 0  . Linaclotide (LINZESS) 145 MCG CAPS capsule Take 1-2 capsules (145-290 mcg total) by mouth daily as needed. 60 capsule 11  . lisdexamfetamine (VYVANSE) 30 MG capsule Take 1 capsule (30 mg total) by mouth daily. 30 capsule 0  . meloxicam (MOBIC) 15 MG tablet Take 1 tablet (15 mg total) by mouth daily as needed for pain. 30 tablet 5  . metoprolol (LOPRESSOR) 100 MG tablet Take 1 tablet (100 mg total) by mouth 2 (two) times daily. 180 tablet 3  . nicotine (NICODERM CQ) 14 mg/24hr patch Place 1 patch (14 mg total) onto the skin daily. 28 patch 0  . orphenadrine (NORFLEX) 100 MG tablet Limit 1 tab by mouth per day or twice per day if tolerated 60 tablet 2  . pantoprazole (PROTONIX) 40 MG tablet Take 1 tablet (40 mg total) by mouth daily. 90 tablet 3  . pregabalin (LYRICA) 50 MG capsule Take 1 capsule (50 mg total) by mouth 3 (three) times daily. 90 capsule 1  . ranitidine (ZANTAC) 300 MG tablet Take 1 tablet (300 mg total) by mouth at bedtime. 90 tablet 3  . traMADol (ULTRAM) 50 MG tablet Limit 1 tablet by mouth 4 - 8  times per day if tolerated 240 tablet 0  . Vitamin D, Ergocalciferol, (DRISDOL) 50000 UNITS CAPS capsule Take 1  capsule (50,000 Units total) by mouth every 7 (seven) days. 8 capsule 0  . nortriptyline (PAMELOR) 25 MG capsule Take 1 capsule (25 mg total) by mouth at bedtime. (Patient not taking: Reported on 11/24/2014) 90 capsule 3   No facility-administered medications prior to visit.    PAST MEDICAL HISTORY: Past Medical History  Diagnosis Date  . Attention deficit disorder without mention of hyperactivity   . Anxiety state, unspecified   . Pain in joint, site unspecified   . Unspecified asthma(493.90)   . Migraine without aura, without mention of intractable migraine without mention of status migrainosus   . Depressive disorder, not elsewhere classified   . Unspecified essential hypertension   . Unspecified vitamin D deficiency   . Hypertension   . Esophageal reflux     PAST SURGICAL HISTORY: Past Surgical History  Procedure Laterality Date  . Partial hysterectomy  2007  . Tonsillectomy  04/29/00  . Incise and drain abcess  2011    Right axilla- Dr. Barkley Bruns  . Abdominal ultrasound  10/12/97  .  Plantar fascia surgery  2000  . Transthoracic echocardiogram  11/2009    EF=>55%; trace MR & TR  . Nm myocar perf wall motion  11/2009    bruce myoview; perfusion defect in anterior region consistent with breast attenuation; remaining myocardium with normal perfusion; post-stress EF 74%; low risk scan   . Appendectomy  05/27/2013    gangrenous  . Abdominal hysterectomy    . Tonsillectomy Bilateral   . Knee arthroscopy      both knees    FAMILY HISTORY: Family History  Problem Relation Age of Onset  . Asthma      Family history  . Coronary artery disease      1st degree female < 50  . Diabetes      1st degree relative  . Breast cancer Mother   . Multiple sclerosis Mother   . Cancer Mother 79    breast ca  . Depression Mother   . Lupus Sister     PTSD  . Alcohol abuse Sister   . Bipolar disorder Sister   . Alcohol abuse Brother     also HTN, lupus, enlarged heart  . Heart  disease Father     cardiac arrest  . Heart disease Maternal Grandfather     cardiac arrest  . COPD Paternal Grandmother   . Diabetes Paternal Grandfather   . Heart disease Paternal Grandfather   . ADD / ADHD Child   . Asthma Child     SOCIAL HISTORY: Social History   Social History  . Marital Status: Married    Spouse Name: N/A  . Number of Children: 2  . Years of Education: N/A   Occupational History  . 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 Smoking status: Former Smoker -- 1.00 packs/day for 27 years    Types: Cigarettes    Start date: 10/31/1986    Quit date: 11/22/2014  . Smokeless tobacco: Never Used  . Alcohol Use: No  . Drug Use: No  . Sexual Activity: Yes    Birth Control/ Protection: None   Other Topics Concern  . Not on file   Social History Narrative   Patient lives at home with her mother.   Disabled   Right handed   Caffeine three cups daily   Some college education     PHYSICAL EXAM  Filed Vitals:   11/24/14 1256  BP: 102/84  Pulse: 72  Height: 5\' 6"  (1.676 m)  Weight: 201 lb 6.4 oz (91.354 kg)   Body mass index is 32.52 kg/(m^2).  Generalized: Well developed, in no acute distress  Head: normocephalic and atraumatic,. Oropharynx benign  Neck: Supple, no carotid bruits  Cardiac: Regular rate rhythm, no murmur  Musculoskeletal: No deformity   Neurological examination   Mentation: Alert oriented to time, place, history taking. Attention span and concentration appropriate. Recent and remote memory intact.  Follows all commands speech and language fluent.   Cranial nerve II-XII: Pupils were equal round reactive to light extraocular movements were full, visual field were full on confrontational test. Facial sensation and strength were normal. hearing was intact to finger rubbing bilaterally. Uvula tongue midline. head turning and shoulder shrug were normal and  symmetric.Tongue protrusion into cheek strength was normal. Motor: normal bulk and tone, full strength in the BUE, BLE, fine finger movements normal, no pronator drift. No focal weakness Sensory: normal and symmetric to light  touch, pinprick, and  Vibration, proprioception  Coordination: finger-nose-finger, heel-to-shin bilaterally, no dysmetria Reflexes: Brachioradialis 2/2, biceps 2/2, triceps 2/2, patellar 2/2, Achilles 2/2, plantar responses were flexor bilaterally. Gait and Station: Rising up from seated position without assistance, normal stance,  moderate stride, good arm swing, smooth turning, able to perform tiptoe, and heel walking without difficulty. Tandem gait is steady  DIAGNOSTIC DATA (LABS, IMAGING, TESTING) - I reviewed patient records, labs, notes, testing and imaging myself where available.  Lab Results  Component Value Date   WBC 11.9* 09/06/2014   HGB 14.1 09/06/2014   HCT 40.2 09/06/2014   MCV 90.5 09/06/2014   PLT 252 09/06/2014      Component Value Date/Time   NA 141 09/06/2014 1506   NA 137 08/17/2014 0818   NA 136 06/02/2013 1435   K 4.5 09/06/2014 1506   K 4.2 08/17/2014 0818   K 4.2 06/02/2013 1435   CL 101 08/17/2014 0818   CL 104 06/02/2013 1435   CO2 25 09/06/2014 1506   CO2 26 08/17/2014 0818   CO2 27 06/02/2013 1435   GLUCOSE 90 09/06/2014 1506   GLUCOSE 92 08/17/2014 0818   GLUCOSE 115* 06/02/2013 1435   BUN 13.1 09/06/2014 1506   BUN 10 08/17/2014 0818   BUN 8 06/02/2013 1435   CREATININE 0.8 09/06/2014 1506   CREATININE 0.69 08/17/2014 0818   CREATININE 0.81 06/02/2013 1435   CREATININE 0.65 04/28/2010 1346   CALCIUM 9.1 09/06/2014 1506   CALCIUM 9.5 08/17/2014 0818   CALCIUM 9.1 06/02/2013 1435   PROT 7.1 09/06/2014 1506   PROT 8.5* 03/16/2014 0814   PROT 6.7 05/27/2013 1007   ALBUMIN 3.9 09/06/2014 1506   ALBUMIN 4.9 03/16/2014 0814   ALBUMIN 3.4 05/27/2013 1007   AST 15 09/06/2014 1506   AST 14 03/16/2014 0814   AST 18  05/27/2013 1007   ALT 18 09/06/2014 1506   ALT 19 03/16/2014 0814   ALT 29 05/27/2013 1007   ALKPHOS 118 09/06/2014 1506   ALKPHOS 141* 03/16/2014 0814   ALKPHOS 100 05/27/2013 1007   BILITOT <0.20 09/06/2014 1506   BILITOT 0.3 03/16/2014 0814   BILITOT 0.5 05/27/2013 1007   GFRNONAA >60 06/02/2013 1435   GFRNONAA >90 09/20/2011 1424   GFRAA >60 06/02/2013 1435   GFRAA >90 09/20/2011 1424    Lab Results  Component Value Date   HGBA1C 5.5 08/17/2014    Lab Results  Component Value Date   TSH 1.83 10/01/2013     ASSESSMENT AND PLAN  44 y.o. year old female  has a past medical history of Anxiety state,  Migraine without aura, without mention of intractable migraine without mention of status migrainosus; Depressive disorder, not elsewhere classified;  here to follow up.  Continue Relpax acutely Continue Nortriptyline at 25 mg daily as preventive Given list of migraine triggers and reviewed these with the patient eliminate one at a time if they are causing a headache F/U in 6 months Dennie Bible, [facility], [facility], [facility] [address], [place], [place] [address] [phone]

## 2014-11-28 ENCOUNTER — Ambulatory Visit: Payer: BLUE CROSS/BLUE SHIELD | Attending: Pain Medicine | Admitting: Pain Medicine

## 2014-11-28 ENCOUNTER — Encounter: Payer: Self-pay | Admitting: Pain Medicine

## 2014-11-28 VITALS — BP 123/74 | HR 66 | Temp 97.8°F | Resp 18 | Ht 66.0 in | Wt 200.0 lb

## 2014-11-28 DIAGNOSIS — G588 Other specified mononeuropathies: Secondary | ICD-10-CM

## 2014-11-28 DIAGNOSIS — M5137 Other intervertebral disc degeneration, lumbosacral region: Secondary | ICD-10-CM

## 2014-11-28 DIAGNOSIS — M5134 Other intervertebral disc degeneration, thoracic region: Secondary | ICD-10-CM

## 2014-11-28 DIAGNOSIS — M542 Cervicalgia: Secondary | ICD-10-CM | POA: Diagnosis not present

## 2014-11-28 DIAGNOSIS — M545 Low back pain: Secondary | ICD-10-CM

## 2014-11-28 DIAGNOSIS — M79604 Pain in right leg: Secondary | ICD-10-CM

## 2014-11-28 DIAGNOSIS — R51 Headache: Secondary | ICD-10-CM | POA: Insufficient documentation

## 2014-11-28 DIAGNOSIS — M62838 Other muscle spasm: Secondary | ICD-10-CM | POA: Diagnosis not present

## 2014-11-28 DIAGNOSIS — M5136 Other intervertebral disc degeneration, lumbar region: Secondary | ICD-10-CM

## 2014-11-28 DIAGNOSIS — M172 Bilateral post-traumatic osteoarthritis of knee: Secondary | ICD-10-CM

## 2014-11-28 DIAGNOSIS — M5481 Occipital neuralgia: Secondary | ICD-10-CM

## 2014-11-28 DIAGNOSIS — M47814 Spondylosis without myelopathy or radiculopathy, thoracic region: Secondary | ICD-10-CM

## 2014-11-28 DIAGNOSIS — M5459 Other low back pain: Secondary | ICD-10-CM

## 2014-11-28 DIAGNOSIS — M170 Bilateral primary osteoarthritis of knee: Secondary | ICD-10-CM

## 2014-11-28 DIAGNOSIS — M47894 Other spondylosis, thoracic region: Secondary | ICD-10-CM

## 2014-11-28 DIAGNOSIS — M79605 Pain in left leg: Secondary | ICD-10-CM

## 2014-11-28 DIAGNOSIS — Z7982 Long term (current) use of aspirin: Secondary | ICD-10-CM

## 2014-11-28 MED ORDER — ORPHENADRINE CITRATE 30 MG/ML IJ SOLN
60.0000 mg | Freq: Once | INTRAMUSCULAR | Status: DC
Start: 2014-11-28 — End: 2015-10-17

## 2014-11-28 MED ORDER — BUPIVACAINE HCL (PF) 0.25 % IJ SOLN: 30.0000 mL | Freq: Once | INTRAMUSCULAR | Status: DC

## 2014-11-28 MED ORDER — BUPIVACAINE HCL (PF) 0.25 % IJ SOLN
INTRAMUSCULAR | Status: AC
Start: 2014-11-28 — End: 2014-11-28
  Administered 2014-11-28: 10:00:00
  Filled 2014-11-28: qty 30

## 2014-11-28 MED ORDER — ORPHENADRINE CITRATE 30 MG/ML IJ SOLN
INTRAMUSCULAR | Status: AC
  Filled 2014-11-28: qty 2

## 2014-11-28 MED ORDER — TRIAMCINOLONE ACETONIDE 40 MG/ML IJ SUSP: 40.0000 mg | Freq: Once | INTRAMUSCULAR | Status: DC

## 2014-11-28 MED ORDER — TRIAMCINOLONE ACETONIDE 40 MG/ML IJ SUSP
INTRAMUSCULAR | Status: AC
  Administered 2014-11-28: 10:00:00
  Filled 2014-11-28: qty 1

## 2014-11-28 NOTE — Progress Notes (Signed)
Safety precautions to be maintained throughout the outpatient stay will include: orient to surroundings, keep bed in low position, maintain call bell within reach at all times, provide assistance with transfer out of bed and ambulation.  

## 2014-11-28 NOTE — Progress Notes (Signed)
   Subjective:    Patient ID: [name], female    DOB: December 09, 1970, 44 y.o.   MRN: [id number]  HPI  NOTE: The patient is a 44 y.o.-year-old female who [facility] for further evaluation and treatment of pain consisting of pain involving the region of the neck and headache.  Patient is with prior studies revealing patient to be with degenerative changes of the cervical spine. The patient is with headaches with pain radiating from the neck toward the back of the head. Patient states that the headaches are associated with severe muscle spasms of the neck and upper back region as well. There is concern regarding patient's headaches began due to greater occipital neuralgia. .  The risks, benefits, and expectations of the procedure have been discussed and explained to patient, who is understanding and wishes to proceed with interventional treatment as discussed and as explained to patient.  Will proceed with greater occipital nerve blocks with myoneural block injections at this time as discussed and as explained to patient.  All are understanding and in agreement with suggested treatment plan.    PROCEDURE:  Greater occipital nerve block on the left side with IV Versed, IV Fentanyl, conscious sedation, EKG, blood pressure, pulse, pulse oximetry monitoring.  Procedure performed with patient in prone position.  Greater occipital nerve block on the left side.   With patient in prone position, Betadine prep of proposed entry site accomplished.  Following identification of the nuchal ridge, 22 -gauge needle was inserted at the level of the nuchal ridge medial to the occipital artery.  Following negative aspiration, 4cc 0.25% bupivacaine with Kenalog injected for left greater occipital nerve block.  Needle was removed.  Patient tolerated injection well.   Greater occipital nerve block on the rightt side. The greater occipital nerve block on the right side was performed exactly as the  left greater occipital nerve block was performed and utilizing the same technique.   A total of 10 mg Kenalog was utilized for the entire procedure.  PLAN:    1. Medications: Will continue presently prescribed medications at this time. 2. Patient to follow up with primary care physician Dr Alain Marion for evaluation of blood pressure and general medical condition status post procedure performed on today's visit. 3. Neurological evaluation for further assessment of headaches for further studies as discussed. 4. Surgical evaluation as discussed.  5. Patient may be candidate for Botox injections, radiofrequency procedures, as well as implantation type procedures pending response to treatment rendered on today's visit and pending follow-up evaluation. 6. Patient has been advised to adhere to proper body mechanics and to avoid activities which appear to aggravate condition.cations:  Will continue presently prescribed medications at this time. 7. The patient is understanding and in agreement with the suggested treatment plan.      Review of Systems     Objective:   Physical Exam        Assessment & Plan:

## 2014-11-28 NOTE — Progress Notes (Signed)
I have reviewed and agreed above plan. 

## 2014-11-28 NOTE — Patient Instructions (Addendum)
PLAN   Continue present medication Norflex diclofenac gel and tramadol.  We will call Dr.Daldorf to request permission to refill your hydrocodone acetaminophen  F/U PCP  Plotnikov for evaliation of  BP and general medical  condition  F/U surgical evaluation. Follow-up Dr.Daldorf as planned   F/U neurological evaluation. May consider pending follow-up evaluations  May consider radiofrequency rhizolysis or intraspinal procedures pending response to present treatment and F/U evaluation Pain Management Discharge Instructions  General Discharge Instructions :  If you need to reach your doctor call: Monday-Friday 8:00 am - 4:00 pm at [phone] or toll free [phone].  After clinic hours [phone] to have operator reach doctor.  Bring all of your medication bottles to all your appointments in the pain clinic.  To cancel or reschedule your appointment with Pain Management please remember to call 24 hours in advance to avoid a fee.  Refer to the educational materials which you have been given on: General Risks, I had my Procedure. Discharge Instructions, Post Sedation.  Post Procedure Instructions:  The drugs you were given will stay in your system until tomorrow, so for the next 24 hours you should not drive, make any legal decisions or drink any alcoholic beverages.  You may eat anything you prefer, but it is better to start with liquids then soups and crackers, and gradually work up to solid foods.  Please notify your doctor immediately if you have any unusual bleeding, trouble breathing or pain that is not related to your normal pain.  Depending on the type of procedure that was done, some parts of your body may feel week and/or numb.  This usually clears up by tonight or the next day.  Walk with the use of an assistive device or accompanied by an adult for the 24 hours.  You may use ice on the affected area for the first 24 hours.  Put ice in a Ziploc bag and cover with a  towel and place against area 15 minutes on 15 minutes off.  You may switch to heat after 24 hours.

## 2014-11-29 ENCOUNTER — Telehealth: Payer: Self-pay | Admitting: *Deleted

## 2014-11-29 ENCOUNTER — Other Ambulatory Visit: Payer: Self-pay | Admitting: Pain Medicine

## 2014-11-29 NOTE — Telephone Encounter (Signed)
Left voicemail to call our office if she has any problems, questions or concerns following procedure on yesterday.

## 2014-12-01 ENCOUNTER — Other Ambulatory Visit: Payer: Self-pay | Admitting: Psychiatry

## 2014-12-01 NOTE — Telephone Encounter (Signed)
rx faxed - confirmed . rx for valium  id# [id number] order # [id number].

## 2014-12-05 ENCOUNTER — Ambulatory Visit (INDEPENDENT_AMBULATORY_CARE_PROVIDER_SITE_OTHER): Payer: Commercial Indemnity | Admitting: Psychiatry

## 2014-12-05 ENCOUNTER — Encounter: Payer: Self-pay | Admitting: Psychiatry

## 2014-12-05 VITALS — BP 124/82 | HR 82 | Temp 97.1°F | Ht 66.0 in | Wt 199.2 lb

## 2014-12-05 DIAGNOSIS — F909 Attention-deficit hyperactivity disorder, unspecified type: Secondary | ICD-10-CM | POA: Diagnosis not present

## 2014-12-05 DIAGNOSIS — F988 Other specified behavioral and emotional disorders with onset usually occurring in childhood and adolescence: Secondary | ICD-10-CM

## 2014-12-05 DIAGNOSIS — F319 Bipolar disorder, unspecified: Secondary | ICD-10-CM

## 2014-12-05 MED ORDER — LISDEXAMFETAMINE DIMESYLATE 30 MG PO CAPS: 30.0000 mg | ORAL_CAPSULE | Freq: Every day | ORAL | Status: DC

## 2014-12-05 MED ORDER — BENZTROPINE MESYLATE 1 MG PO TABS: 1.0000 mg | ORAL_TABLET | Freq: Every day | ORAL | Status: DC

## 2014-12-05 MED ORDER — ARIPIPRAZOLE 30 MG PO TABS: ORAL_TABLET | ORAL | Status: DC

## 2014-12-05 NOTE — Progress Notes (Signed)
[facility] MD/PA/NP OP Progress Note  12/05/2014 10:33 AM [name]  MRN:  [id number]  Subjective:  Patient returns a follow-up or bipolar type II, generalized anxiety sort and ADHD. She states that she's taken the steps try to enroll in vocational rehabilitation and actually had a job interview and believes she is going to get that job. She states she thinks she would probably be able to handle part-time work but definitely does not feel like she be ready to do full-time work. She has been out of work for 5 years and states that this process has given her some anxiety although she realizes it will be good for her. She indicates that she's probably going to try to continue to see the therapist that was in this clinic that is going into private practice.  She feels like her increase in Cymbalta that we made at the last visit is been helpful to her. She feels like perhaps she is less anxious with that increase. She feels like her medications are working. We talked about the last visit about increasing her Vyvanse but we were reluctant to do so because she had some elevated blood pressure readings. She has a primary care is address this and adjusted her blood pressure medication. She relates that even though her blood pressure is now under control that she might want to wait to increase her Vyvanse when she starts working because she is aware that higher dose might worsen anxiety. Chief Complaint: Stress Chief Complaint    Follow-up; Medication Refill     Visit Diagnosis:     ICD-9-CM ICD-10-CM   1. Bipolar 1 disorder (HCC) 296.7 F31.9 ARIPiprazole (ABILIFY) 30 MG tablet     benztropine (COGENTIN) 1 MG tablet  2. ADD (attention deficit disorder) 314.00 F90.9 lisdexamfetamine (VYVANSE) 30 MG capsule    Past Medical History:  Past Medical History  Diagnosis Date  . Attention deficit disorder without mention of hyperactivity   . Anxiety state, unspecified   . Pain in joint, site unspecified   .  Unspecified asthma(493.90)   . Migraine without aura, without mention of intractable migraine without mention of status migrainosus   . Depressive disorder, not elsewhere classified   . Unspecified essential hypertension   . Unspecified vitamin D deficiency   . Hypertension   . Esophageal reflux     Past Surgical History  Procedure Laterality Date  . Partial hysterectomy  2007  . Tonsillectomy  04/29/00  . Incise and drain abcess  2011    Right axilla- Dr. Barkley Bruns  . Abdominal ultrasound  10/12/97  . Plantar fascia surgery  2000  . Transthoracic echocardiogram  11/2009    EF=>55%; trace MR & TR  . Nm myocar perf wall motion  11/2009    bruce myoview; perfusion defect in anterior region consistent with breast attenuation; remaining myocardium with normal perfusion; post-stress EF 74%; low risk scan   . Appendectomy  05/27/2013    gangrenous  . Abdominal hysterectomy    . Tonsillectomy Bilateral   . Knee arthroscopy      both knees   Family History:  Family History  Problem Relation Age of Onset  . Asthma      Family history  . Coronary artery disease      1st degree female < 50  . Diabetes      1st degree relative  . Breast cancer Mother   . Multiple sclerosis Mother   . Cancer Mother 59    breast ca  .  Depression Mother   . Lupus Sister     PTSD  . Alcohol abuse Sister   . Bipolar disorder Sister   . Alcohol abuse Brother     also HTN, lupus, enlarged heart  . Heart disease Father     cardiac arrest  . Heart disease Maternal Grandfather     cardiac arrest  . COPD Paternal Grandmother   . Diabetes Paternal Grandfather   . Heart disease Paternal Grandfather   . ADD / ADHD Child   . Asthma Child    Social History:  Social History   Social History  . Marital Status: Married    Spouse Name: N/A  . Number of Children: 2  . Years of Education: N/A   Occupational History  . HR; waiting table 2 d/wk; back to school; filed for disability; lost her job 2011      Social History Main Topics  . Smoking status: Former Smoker -- 1.00 packs/day for 27 years    Types: Cigarettes    Start date: 10/31/1986    Quit date: 11/22/2014  . Smokeless tobacco: Never Used  . Alcohol Use: No  . Drug Use: No  . Sexual Activity: Yes    Birth Control/ Protection: None   Other Topics Concern  . None   Social History Narrative   Patient lives at home with her mother.   Disabled   Right handed   Caffeine three cups daily   Some college education   Additional History:   Assessment:   Musculoskeletal:  Strength & Muscle Tone: within normal limits Gait & Station: normal Patient leans: N/A  Psychiatric Specialty Exam: HPI  Review of Systems  Psychiatric/Behavioral: Negative for depression, suicidal ideas, hallucinations, memory loss and substance abuse. The patient is nervous/anxious. The patient does not have insomnia.   All other systems reviewed and are negative.   Blood pressure 124/82, pulse 82, temperature 97.1 F (36.2 C), temperature source Tympanic, height 5\' 6"  (1.676 m), weight 199 lb 3.2 oz (90.357 kg), SpO2 97 %.Body mass index is 32.17 kg/(m^2).  General Appearance: Well Groomed  Eye Contact:  Good  Speech:  Normal Rate  Volume:  Normal  Mood:  Stressed  Affect:  Anxious, but bright and able to smile  Thought Process:  Linear  Orientation:  Full (Time, Place, and Person)  Thought Content:  Negative  Suicidal Thoughts:  No  Homicidal Thoughts:  No  Memory:  Immediate;   Good Recent;   Good Remote;   Good  Judgement:  Good  Insight:  Good  Psychomotor Activity:  Negative  Concentration:  Good  Recall:  Good  Fund of Knowledge: Good  Language: Good  Akathisia:  Negative  Handed:    AIMS (if indicated):  Done 12/05/14 normal  Assets:  Communication Skills Desire for Improvement Social Support  ADL's:  Intact  Cognition: WNL  Sleep:  good   Is the patient at risk to self?  No. Has the patient been a risk to self in the  past 6 months?  No. Has the patient been a risk to self within the distant past?  No. Is the patient a risk to others?  No. Has the patient been a risk to others in the past 6 months?  No. Has the patient been a risk to others within the distant past?  No.  Current Medications: Current Outpatient Prescriptions  Medication Sig Dispense Refill  . albuterol (PROAIR HFA) 108 (90 BASE) MCG/ACT inhaler Inhale 2 puffs into  the lungs every 4 (four) hours as needed. For shortness of breath. 18 g 2  . albuterol (PROVENTIL) (2.5 MG/3ML) 0.083% nebulizer solution Take 3 mLs (2.5 mg total) by nebulization every 6 (six) hours as needed. For shortness of breath. 75 mL 0  . ARIPiprazole (ABILIFY) 30 MG tablet Take one tablet by mouth one time daily 30 tablet 2  . aspirin (BAYER ASPIRIN) 325 MG tablet Take 1 tablet (325 mg total) by mouth daily. 100 tablet 3  . benztropine (COGENTIN) 1 MG tablet Take 1 tablet (1 mg total) by mouth at bedtime. 30 tablet 2  . budesonide-formoterol (SYMBICORT) 160-4.5 MCG/ACT inhaler Inhale 2 puffs into the lungs 2 (two) times daily. 1 Inhaler 2  . diazepam (VALIUM) 5 MG tablet TAKE 1 TABLET DAILY AND TAKE SECOND TABLET IF NEEDED 45 tablet 2  . diclofenac sodium (VOLTAREN) 1 % GEL Apply 2-4 mg to painful area of skin 4 times per day if tolerated . Limit 4 grams per application 025 g 2  . DULoxetine (CYMBALTA) 30 MG capsule Two capsules in the morning, and one capsule in the evening. 90 capsule 3  . eletriptan (RELPAX) 40 MG tablet Take 1 tablet (40 mg total) by mouth as needed for migraine or headache. May repeat in 2 hours if headache persists or recurs. 15 tablet 6  . HYDROcodone-acetaminophen (VICODIN) 5-325 mg TABS tablet Limit one tablet by mouth every 4-6 hours as needed for pain 40 tablet 0  . Linaclotide (LINZESS) 145 MCG CAPS capsule Take 1-2 capsules (145-290 mcg total) by mouth daily as needed. 60 capsule 11  . lisdexamfetamine (VYVANSE) 30 MG capsule Take 1 capsule (30  mg total) by mouth daily. 30 capsule 0  . meloxicam (MOBIC) 15 MG tablet Take 1 tablet (15 mg total) by mouth daily as needed for pain. 30 tablet 5  . metoprolol (LOPRESSOR) 100 MG tablet Take 1 tablet (100 mg total) by mouth 2 (two) times daily. 180 tablet 3  . nortriptyline (PAMELOR) 10 MG capsule Will start when finishes the 30mg  po daily dosing.    . nortriptyline (PAMELOR) 25 MG capsule Take 1 capsule (25 mg total) by mouth at bedtime. 90 capsule 3  . orphenadrine (NORFLEX) 100 MG tablet Limit 1 tab by mouth per day or twice per day if tolerated 60 tablet 2  . pantoprazole (PROTONIX) 40 MG tablet Take 1 tablet (40 mg total) by mouth daily. 90 tablet 3  . pregabalin (LYRICA) 50 MG capsule Take 1 capsule (50 mg total) by mouth 3 (three) times daily. 90 capsule 1  . ranitidine (ZANTAC) 300 MG tablet Take 1 tablet (300 mg total) by mouth at bedtime. 90 tablet 3  . traMADol (ULTRAM) 50 MG tablet Limit 1 tablet by mouth 4 - 8  times per day if tolerated 240 tablet 0  . Vitamin D, Ergocalciferol, (DRISDOL) 50000 UNITS CAPS capsule Take 1 capsule (50,000 Units total) by mouth every 7 (seven) days. 8 capsule 0  . nicotine (NICODERM CQ) 14 mg/24hr patch Place 1 patch (14 mg total) onto the skin daily. 28 patch 0   Current Facility-Administered Medications  Medication Dose Route Frequency Provider Last Rate Last Dose  . bupivacaine (PF) (MARCAINE) 0.25 % injection 30 mL  30 mL Other Once Mohammed Kindle, MD      . orphenadrine (NORFLEX) injection 60 mg  60 mg Intramuscular Once Mohammed Kindle, MD      . triamcinolone acetonide (KENALOG-40) injection 40 mg  40 mg Other Once  Mohammed Kindle, MD        Medical Decision Making:  Established Problem, Stable/Improving (1) and Review of Medication Regimen & Side Effects (2)  Bipolar disorder type II-we will continue her Abilify at 30 mg daily. She reports some issues with anxiety and Cymbalta 60 mg in the morning and 30 mg in the evening.  Generalized anxiety  Disorder-she states that her anxiety she does not feels under control and thus we'll increase the Cymbalta as noted above. Patient is prescribed Valium 5 mg 1-2 tablets daily as needed   ADHD-patient reports some benefit from the Vyvanse 30 mg daily. In the future we may increase it but at this time patient is comfortable leaving fair because increased dose might worsen her anxiety. In addition she states that if she goes to work that might be an appropriate time increase the dose due to work responsibilities.   I spent time discussing with patient the polypharmacy and concerns about adding additional medications. Patient case she has an adequate supply of all her medications with the exception of the Cymbalta because we are changing the dose.  Patient will follow up in 2 month. She will continue in therapy. She's been encouraged colony questions concerns prior to her next appointment.  Treatment Plan Summary:Medication management and Plan Plan   Faith Rogue 12/05/2014, 10:33 AM

## 2014-12-08 ENCOUNTER — Ambulatory Visit: Payer: Self-pay | Admitting: Psychiatry

## 2014-12-13 ENCOUNTER — Encounter: Payer: Self-pay | Admitting: Pain Medicine

## 2014-12-13 ENCOUNTER — Ambulatory Visit: Payer: BLUE CROSS/BLUE SHIELD | Attending: Pain Medicine | Admitting: Pain Medicine

## 2014-12-13 VITALS — BP 130/97 | HR 81 | Temp 98.6°F | Resp 14 | Ht 66.0 in | Wt 198.0 lb

## 2014-12-13 DIAGNOSIS — M5136 Other intervertebral disc degeneration, lumbar region: Secondary | ICD-10-CM | POA: Insufficient documentation

## 2014-12-13 DIAGNOSIS — M546 Pain in thoracic spine: Secondary | ICD-10-CM | POA: Diagnosis present

## 2014-12-13 DIAGNOSIS — M545 Low back pain: Secondary | ICD-10-CM | POA: Diagnosis present

## 2014-12-13 DIAGNOSIS — M62830 Muscle spasm of back: Secondary | ICD-10-CM | POA: Diagnosis not present

## 2014-12-13 DIAGNOSIS — M47814 Spondylosis without myelopathy or radiculopathy, thoracic region: Secondary | ICD-10-CM | POA: Diagnosis not present

## 2014-12-13 DIAGNOSIS — M47894 Other spondylosis, thoracic region: Secondary | ICD-10-CM

## 2014-12-13 DIAGNOSIS — M172 Bilateral post-traumatic osteoarthritis of knee: Secondary | ICD-10-CM

## 2014-12-13 DIAGNOSIS — M5481 Occipital neuralgia: Secondary | ICD-10-CM

## 2014-12-13 DIAGNOSIS — M533 Sacrococcygeal disorders, not elsewhere classified: Secondary | ICD-10-CM | POA: Insufficient documentation

## 2014-12-13 DIAGNOSIS — M5134 Other intervertebral disc degeneration, thoracic region: Secondary | ICD-10-CM

## 2014-12-13 DIAGNOSIS — M47816 Spondylosis without myelopathy or radiculopathy, lumbar region: Secondary | ICD-10-CM | POA: Diagnosis not present

## 2014-12-13 DIAGNOSIS — M79604 Pain in right leg: Secondary | ICD-10-CM

## 2014-12-13 DIAGNOSIS — G588 Other specified mononeuropathies: Secondary | ICD-10-CM | POA: Diagnosis not present

## 2014-12-13 DIAGNOSIS — M170 Bilateral primary osteoarthritis of knee: Secondary | ICD-10-CM | POA: Diagnosis not present

## 2014-12-13 DIAGNOSIS — M5459 Other low back pain: Secondary | ICD-10-CM

## 2014-12-13 DIAGNOSIS — M5137 Other intervertebral disc degeneration, lumbosacral region: Secondary | ICD-10-CM

## 2014-12-13 DIAGNOSIS — M79605 Pain in left leg: Secondary | ICD-10-CM

## 2014-12-13 MED ORDER — HYDROCODONE-ACETAMINOPHEN 5-325MG PREPACK (~~LOC~~: ORAL_TABLET | ORAL | Status: DC

## 2014-12-13 MED ORDER — TRAMADOL HCL 50 MG PO TABS: ORAL_TABLET | ORAL | Status: DC

## 2014-12-13 MED ORDER — ORPHENADRINE CITRATE ER 100 MG PO TB12: ORAL_TABLET | ORAL | Status: DC

## 2014-12-13 NOTE — Progress Notes (Signed)
Subjective:    Patient ID: [name], female    DOB: 1970/02/28, 44 y.o.   MRN: [id number]  HPI  Patient is a 44 year old female who returns to pain management Center for further evaluation and treatment of pain involving the region of the mid and lower back lower extremity region. Patient states that she has severe muscle spasms occurring the mid back region and wishes to undergo interventional treatment in attempt to decrease severity of symptoms. Patient stated the pain is aggravated by standing walking reaching and lifting pushing pulling maneuvers and also interferes with ability to obtain restful sleep. We will proceed with intercostal nerve blocks to be performed at time return appointment in attempt to decrease severity of patient's symptoms, minimize progression of patient's symptoms, and avoid the need for more involved treatment. The patient was in agreement with suggested treatment plan. The patient will continue Ultram and Norflex which patient tolerates well at this time. The patient agreed to suggested treatment plan     Review of Systems     Objective:   Physical Exam  There was tenderness to palpation of the splenius capitis and occipitalis musculature regions of mild degree. Patient appeared to be unremarkable Spurling's maneuver. Palpation over the acromioclavicular and glenohumeral joint regions reproduces minimal discomfort. There was moderate to moderately severe tenderness to palpation of the paraspinal musculature region of the mid and lower thoracic paraspinal musculature region with the right being with more significant spasms in the left. No crepitus of the thoracic region was noted. Patient appeared to be with no increased pain of significant degree with Tinel and Phalen's maneuver. Palpation over the lumbar paraspinal muscles and lumbar facet region was with moderate tenderness to palpation with lateral bending and rotation extension and palpation over the  lumbar facets reproduced moderate discomfort. Straight leg raise was tolerates approximately 30 without increased pain with dorsiflexion noted. There were well-healed surgical scars of the knees with tenderness to palpation of the knees with negative anterior and posterior drawer signs without ballottement of the patella. There was no increased warmth and erythema in the region of the knees the knees were tenderness to palpation with negative anterior and posterior drawer signs. There was mild tenderness of the greater trochanteric region and iliotibial band region and moderate tenderness to palpation of the PSIS and PI I S regions. There was negative clonus negative Homans. Abdomen nontender with no costovertebral maintenance noted.           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  Thoracic facet syndro  Degenerative joint disease of knees  Degenerative disc disease lumbar spine Degenerative changes L3-4, L4-5, and L5-S1, degenerative changes  Sacroiliac joint dysfunction     PLAN  Continue present medication Norflex diclofenac gel and tramadol.   Intercostal nerve blocks to be performed at time return appointment  F/U PCP  Plotnikov for evaliation of  BP and general medical  condition  F/U surgical evaluation. Follow-up Dr.Daldorf  and neurosurgical eval as planned   F/U neurological evaluation. May consider pending follow-up evaluations  May consider radiofrequency rhizolysis or intraspinal procedures pending response to present treatment and F/U evaluation   Patient to call Pain Management Center should patient have concerns prior to scheduled  return appointment

## 2014-12-13 NOTE — Progress Notes (Signed)
Safety precautions to be maintained throughout the outpatient stay will include: orient to surroundings, keep bed in low position, maintain call bell within reach at all times, provide assistance with transfer out of bed and ambulation.  

## 2014-12-13 NOTE — Patient Instructions (Addendum)
PLAN   Continue present medication Norflex diclofenac gel and tramadol.   Intercostal nerve blocks to be performed at time return appointment  F/U PCP  Plotnikov for evaliation of  BP and general medical  condition  F/U surgical evaluation. Follow-up Dr.Daldorf  and neurosurgical eval as planned   F/U neurological evaluation. May consider pending follow-up evaluations  May consider radiofrequency rhizolysis or intraspinal procedures pending response to present treatment and F/U evaluation   Patient to call Pain Management Center should patient have concerns prior to scheduled return appointment.Selective Nerve Root Block Patient Information  Description: Specific nerve roots exit the spinal canal and these nerves can be compressed and inflamed by a bulging disc and bone spurs.  By injecting steroids on the nerve root, we can potentially decrease the inflammation surrounding these nerves, which often leads to decreased pain.  Also, by injecting local anesthesia on the nerve root, this can provide [place] helpful information to give to your referring doctor if it decreases your pain.  Selective nerve root blocks can be done along the spine from the neck to the low back depending on the location of your pain.   After numbing the skin with local anesthesia, a small needle is passed to the nerve root and the position of the needle is verified using x-ray pictures.  After the needle is in correct position, we then deposit the medication.  You may experience a pressure sensation while this is being done.  The entire block usually lasts less than 15 minutes.  Conditions that may be treated with selective nerve root blocks:  Low back and leg pain  Spinal stenosis  Diagnostic block prior to potential surgery  Neck and arm pain  Post laminectomy syndrome  Preparation for the injection:  1. Do not eat any solid food or dairy products within 6 hours of your appointment. 2. You may drink clear  liquids up to 2 hours before an appointment.  Clear liquids include water, black coffee, juice or soda.  No milk or cream please. 3. You may take your regular medications, including pain medications, with a sip of water before your appointment.  Diabetics should hold regular insulin (if taken separately) and take 1/2 normal NPH dose the morning of the procedure.  Carry some sugar containing items with you to your appointment. 4. A driver must accompany you and be prepared to drive you home after your procedure. 5. Bring all your current medications with you. 6. An IV may be inserted and sedation may be given at the discretion of the physician. 7. A blood pressure cuff, EKG, and other monitors will often be applied during the procedure.  Some patients may need to have extra oxygen administered for a short period. 8. You will be asked to provide medical information, including allergies, prior to the procedure.  We must know immediately if you are taking blood  Thinners (like Coumadin) or if you are allergic to IV iodine contrast (dye).  Possible side-effects: All are usually temporary  Bleeding from needle site  Light headedness  Numbness and tingling  Decreased blood pressure  Weakness in arms/legs  Pressure sensation in back/neck  Pain at injection site (several days)  Possible complications: All are extremely rare  Infection  Nerve injury  Spinal headache (a headache wore with upright position)  Call if you experience:  Fever/chills associated with headache or increased back/neck pain  Headache worsened by an upright position  New onset weakness or numbness of an extremity below the  injection site  Hives or difficulty breathing (go to the emergency room)  Inflammation or drainage at the injection site(s)  Severe back/neck pain greater than usual  New symptoms which are concerning to you  Please note:  Although the local anesthetic injected can often make your  back or neck feel good for several hours after the injection the pain will likely return.  It takes 3-5 days for steroids to work on the nerve root. You may not notice any pain relief for at least one week.  If effective, we will often do a series of 3 injections spaced 3-6 weeks apart to maximally decrease your pain.    If you have any questions, please call [phone] [facility] Clinic[facility]AL RISKS AND COMPLICATIONS  What are the risk, side effects and possible complications? Generally speaking, most procedures are safe.  However, with any procedure there are risks, side effects, and the possibility of complications.  The risks and complications are dependent upon the sites that are lesioned, or the type of nerve block to be performed.  The closer the procedure is to the spine, the more serious the risks are.  Great care is taken when placing the radio frequency needles, block needles or lesioning probes, but sometimes complications can occur. 1. Infection: Any time there is an injection through the skin, there is a risk of infection.  This is why sterile conditions are used for these blocks.  There are four possible types of infection. 1. Localized skin infection. 2. Central Nervous System Infection-This can be in the form of Meningitis, which can be deadly. 3. Epidural Infections-This can be in the form of an epidural abscess, which can cause pressure inside of the spine, causing compression of the spinal cord with subsequent paralysis. This would require an emergency surgery to decompress, and there are no guarantees that the patient would recover from the paralysis. 4. Discitis-This is an infection of the intervertebral discs.  It occurs in about 1% of discography procedures.  It is difficult to treat and it may lead to surgery.        2. Pain: the needles have to go through skin and soft tissues, will cause soreness.       3. Damage to internal structures:  The  nerves to be lesioned may be near blood vessels or    other nerves which can be potentially damaged.       4. Bleeding: Bleeding is more common if the patient is taking blood thinners such as  aspirin, Coumadin, Ticiid, Plavix, etc., or if he/she have some genetic predisposition  such as hemophilia. Bleeding into the spinal canal can cause compression of the spinal  cord with subsequent paralysis.  This would require an emergency surgery to  decompress and there are no guarantees that the patient would recover from the  paralysis.       5. Pneumothorax:  Puncturing of a lung is a possibility, every time a needle is introduced in  the area of the chest or upper back.  Pneumothorax refers to free air around the  collapsed lung(s), inside of the thoracic cavity (chest cavity).  Another two possible  complications related to a similar event would include: Hemothorax and Chylothorax.   These are variations of the Pneumothorax, where instead of air around the collapsed  lung(s), you may have blood or chyle, respectively.       6. Spinal headaches: They may occur with any procedures in the area of the  spine.       7. Persistent CSF (Cerebro-Spinal Fluid) leakage: This is a rare problem, but may occur  with prolonged intrathecal or epidural catheters either due to the formation of a fistulous  track or a dural tear.       8. Nerve damage: By working so close to the spinal cord, there is always a possibility of  nerve damage, which could be as serious as a permanent spinal cord injury with  paralysis.       9. Death:  Although rare, severe deadly allergic reactions known as "Anaphylactic  reaction" can occur to any of the medications used.      10. Worsening of the symptoms:  We can always make thing worse.  What are the chances of something like this happening? Chances of any of this occuring are extremely low.  By statistics, you have more of a chance of getting killed in a motor vehicle accident: while driving  to the hospital than any of the above occurring .  Nevertheless, you should be aware that they are possibilities.  In general, it is similar to taking a shower.  Everybody knows that you can slip, hit your head and get killed.  Does that mean that you should not shower again?  Nevertheless always keep in mind that statistics do not mean anything if you happen to be on the wrong side of them.  Even if a procedure has a 1 (one) in a 1,000,000 (million) chance of going wrong, it you happen to be that one..Also, keep in mind that by statistics, you have more of a chance of having something go wrong when taking medications.  Who should not have this procedure? If you are on a blood thinning medication (e.g. Coumadin, Plavix, see list of "Blood Thinners"), or if you have an active infection going on, you should not have the procedure.  If you are taking any blood thinners, please inform your physician.  How should I prepare for this procedure?  Do not eat or drink anything at least six hours prior to the procedure.  Bring a driver with you .  It cannot be a taxi.  Come accompanied by an adult that can drive you back, and that is strong enough to help you if your legs get weak or numb from the local anesthetic.  Take all of your medicines the morning of the procedure with just enough water to swallow them.  If you have diabetes, make sure that you are scheduled to have your procedure done first thing in the morning, whenever possible.  If you have diabetes, take only half of your insulin dose and notify our nurse that you have done so as soon as you arrive at the clinic.  If you are diabetic, but only take blood sugar pills (oral hypoglycemic), then do not take them on the morning of your procedure.  You may take them after you have had the procedure.  Do not take aspirin or any aspirin-containing medications, at least eleven (11) days prior to the procedure.  They may prolong bleeding.  Wear loose  fitting clothing that may be easy to take off and that you would not mind if it got stained with Betadine or blood.  Do not wear any jewelry or perfume  Remove any nail coloring.  It will interfere with some of our monitoring equipment.  NOTE: Remember that this is not meant to be interpreted as a complete list of all possible complications.  Unforeseen problems may occur.  BLOOD THINNERS The following drugs contain aspirin or other products, which can cause increased bleeding during surgery and should not be taken for 2 weeks prior to and 1 week after surgery.  If you should need take something for relief of minor pain, you may take acetaminophen which is found in Tylenol,m Datril, Anacin-3 and Panadol. It is not blood thinner. The products listed below are.  Do not take any of the products listed below in addition to any listed on your instruction sheet.  A.P.C or A.P.C with Codeine Codeine Phosphate Capsules #3 Ibuprofen Ridaura  ABC compound Congesprin Imuran rimadil  Advil Cope Indocin Robaxisal  Alka-Seltzer Effervescent Pain Reliever and Antacid Coricidin or Coricidin-D  Indomethacin Rufen  Alka-Seltzer plus Cold Medicine Cosprin Ketoprofen S-A-C Tablets  Anacin Analgesic Tablets or Capsules Coumadin Korlgesic Salflex  Anacin Extra Strength Analgesic tablets or capsules CP-2 Tablets Lanoril Salicylate  Anaprox Cuprimine Capsules Levenox Salocol  Anexsia-D Dalteparin Magan Salsalate  Anodynos Darvon compound Magnesium Salicylate Sine-off  Ansaid Dasin Capsules Magsal Sodium Salicylate  Anturane Depen Capsules Marnal Soma  APF Arthritis pain formula Dewitt's Pills Measurin Stanback  Argesic Dia-Gesic Meclofenamic Sulfinpyrazone  Arthritis Bayer Timed Release Aspirin Diclofenac Meclomen Sulindac  Arthritis pain formula Anacin Dicumarol Medipren Supac  Analgesic (Safety coated) Arthralgen Diffunasal Mefanamic Suprofen  Arthritis Strength Bufferin Dihydrocodeine Mepro Compound Suprol   Arthropan liquid Dopirydamole Methcarbomol with Aspirin Synalgos  ASA tablets/Enseals Disalcid Micrainin Tagament  Ascriptin Doan's Midol Talwin  Ascriptin A/D Dolene Mobidin Tanderil  Ascriptin Extra Strength Dolobid Moblgesic Ticlid  Ascriptin with Codeine Doloprin or Doloprin with Codeine Momentum Tolectin  Asperbuf Duoprin Mono-gesic Trendar  Aspergum Duradyne Motrin or Motrin IB Triminicin  Aspirin plain, buffered or enteric coated Durasal Myochrisine Trigesic  Aspirin Suppositories Easprin Nalfon Trillsate  Aspirin with Codeine Ecotrin Regular or Extra Strength Naprosyn Uracel  Atromid-S Efficin Naproxen Ursinus  Auranofin Capsules Elmiron Neocylate Vanquish  Axotal Emagrin Norgesic Verin  Azathioprine Empirin or Empirin with Codeine Normiflo Vitamin E  Azolid Emprazil Nuprin Voltaren  Bayer Aspirin plain, buffered or children's or timed BC Tablets or powders Encaprin Orgaran Warfarin Sodium  Buff-a-Comp Enoxaparin Orudis Zorpin  Buff-a-Comp with Codeine Equegesic Os-Cal-Gesic   Buffaprin Excedrin plain, buffered or Extra Strength Oxalid   Bufferin Arthritis Strength Feldene Oxphenbutazone   Bufferin plain or Extra Strength Feldene Capsules Oxycodone with Aspirin   Bufferin with Codeine Fenoprofen Fenoprofen Pabalate or Pabalate-SF   Buffets II Flogesic Panagesic   Buffinol plain or Extra Strength Florinal or Florinal with Codeine Panwarfarin   Buf-Tabs Flurbiprofen Penicillamine   Butalbital Compound Four-way cold tablets Penicillin   Butazolidin Fragmin Pepto-Bismol   Carbenicillin Geminisyn Percodan   Carna Arthritis Reliever Geopen Persantine   Carprofen Gold's salt Persistin   Chloramphenicol Goody's Phenylbutazone   Chloromycetin Haltrain Piroxlcam   Clmetidine heparin Plaquenil   Cllnoril Hyco-pap Ponstel   Clofibrate Hydroxy chloroquine Propoxyphen         Before stopping any of these medications, be sure to consult the physician who ordered them.  Some, such as  Coumadin (Warfarin) are ordered to prevent or treat serious conditions such as "deep thrombosis", "pumonary embolisms", and other heart problems.  The amount of time that you may need off of the medication may also vary with the medication and the reason for which you were taking it.  If you are taking any of these medications, please make sure you notify your pain physician before you undergo any procedures.

## 2014-12-14 ENCOUNTER — Ambulatory Visit: Payer: BLUE CROSS/BLUE SHIELD | Attending: Pain Medicine | Admitting: Pain Medicine

## 2014-12-14 ENCOUNTER — Encounter: Payer: Self-pay | Admitting: Pain Medicine

## 2014-12-14 VITALS — BP 118/68 | HR 80 | Temp 98.3°F | Resp 18 | Ht 66.0 in | Wt 198.0 lb

## 2014-12-14 DIAGNOSIS — M79604 Pain in right leg: Secondary | ICD-10-CM

## 2014-12-14 DIAGNOSIS — M546 Pain in thoracic spine: Secondary | ICD-10-CM | POA: Insufficient documentation

## 2014-12-14 DIAGNOSIS — M5136 Other intervertebral disc degeneration, lumbar region: Secondary | ICD-10-CM

## 2014-12-14 DIAGNOSIS — M545 Low back pain: Secondary | ICD-10-CM

## 2014-12-14 DIAGNOSIS — M170 Bilateral primary osteoarthritis of knee: Secondary | ICD-10-CM

## 2014-12-14 DIAGNOSIS — M172 Bilateral post-traumatic osteoarthritis of knee: Secondary | ICD-10-CM

## 2014-12-14 DIAGNOSIS — M47814 Spondylosis without myelopathy or radiculopathy, thoracic region: Secondary | ICD-10-CM

## 2014-12-14 DIAGNOSIS — M79605 Pain in left leg: Secondary | ICD-10-CM

## 2014-12-14 DIAGNOSIS — M5134 Other intervertebral disc degeneration, thoracic region: Secondary | ICD-10-CM

## 2014-12-14 DIAGNOSIS — M5137 Other intervertebral disc degeneration, lumbosacral region: Secondary | ICD-10-CM

## 2014-12-14 DIAGNOSIS — Z7982 Long term (current) use of aspirin: Secondary | ICD-10-CM

## 2014-12-14 DIAGNOSIS — M47894 Other spondylosis, thoracic region: Secondary | ICD-10-CM

## 2014-12-14 DIAGNOSIS — M5481 Occipital neuralgia: Secondary | ICD-10-CM

## 2014-12-14 DIAGNOSIS — M5459 Other low back pain: Secondary | ICD-10-CM

## 2014-12-14 DIAGNOSIS — G588 Other specified mononeuropathies: Secondary | ICD-10-CM

## 2014-12-14 DIAGNOSIS — M47816 Spondylosis without myelopathy or radiculopathy, lumbar region: Secondary | ICD-10-CM

## 2014-12-14 MED ORDER — ORPHENADRINE CITRATE 30 MG/ML IJ SOLN
INTRAMUSCULAR | Status: AC
  Administered 2014-12-14: 30 mg
  Filled 2014-12-14: qty 2

## 2014-12-14 MED ORDER — ORPHENADRINE CITRATE 30 MG/ML IJ SOLN: 60.0000 mg | Freq: Once | INTRAMUSCULAR | Status: DC

## 2014-12-14 MED ORDER — BUPIVACAINE-EPINEPHRINE (PF) 0.5% -1:200000 IJ SOLN
INTRAMUSCULAR | Status: AC
  Administered 2014-12-14: 30 mL
  Filled 2014-12-14: qty 30

## 2014-12-14 MED ORDER — FENTANYL CITRATE (PF) 100 MCG/2ML IJ SOLN
INTRAMUSCULAR | Status: AC
  Administered 2014-12-14: 100 ug via INTRAVENOUS
  Filled 2014-12-14: qty 2

## 2014-12-14 MED ORDER — LACTATED RINGERS IV SOLN: 1000.0000 mL | INTRAVENOUS | Status: DC

## 2014-12-14 MED ORDER — FENTANYL CITRATE (PF) 100 MCG/2ML IJ SOLN: 100.0000 ug | Freq: Once | INTRAMUSCULAR | Status: DC

## 2014-12-14 MED ORDER — TRIAMCINOLONE ACETONIDE 40 MG/ML IJ SUSP
INTRAMUSCULAR | Status: AC
  Administered 2014-12-14: 40 mg
  Filled 2014-12-14: qty 1

## 2014-12-14 MED ORDER — MIDAZOLAM HCL 5 MG/5ML IJ SOLN: 5.0000 mg | Freq: Once | INTRAMUSCULAR | Status: DC

## 2014-12-14 MED ORDER — MIDAZOLAM HCL 5 MG/5ML IJ SOLN
INTRAMUSCULAR | Status: AC
  Administered 2014-12-14: 4 mg via INTRAVENOUS
  Filled 2014-12-14: qty 5

## 2014-12-14 MED ORDER — BUPIVACAINE HCL (PF) 0.5 % IJ SOLN: 30.0000 mL | Freq: Once | INTRAMUSCULAR | Status: DC

## 2014-12-14 MED ORDER — TRIAMCINOLONE ACETONIDE 40 MG/ML IJ SUSP: 40.0000 mg | Freq: Once | INTRAMUSCULAR | Status: DC

## 2014-12-14 NOTE — Progress Notes (Signed)
   Subjective:    Patient ID: [name], female    DOB: 09-Sep-1970, 44 y.o.   MRN: [id number]  HPI PROCEDURE PERFORMED:  Intercostal nerve block.  HISTORY OF PRESENT ILLNESS:  The patient is a 44 y.o. female who returns to the [facility] for further evaluation and treatment of pain involving the midportion of the back. There is concern regarding the patient's pain being due to significant component of intercostal neuralgia. The risks, benefits, and expectations of the procedure have been discussed and explained to the patient who was understanding and in agreement with suggested treatment plan. We will proceed with interventional treatment as discussed.   DESCRIPTION OF PROCEDURE: Intercostal nerve block with IV Versed, IV fentanyl conscious sedation, EKG, blood pressure, pulse, and pulse oximetry monitoring. The procedure was performed with the patient in the prone position under fluoroscopic guidance.   Intercostal nerve block,  right side: With the patient in the prone position, Betadine prep of proposed entry site was performed under fluoroscopic guidance with AP view of the thoracic spine. Under fluoroscopic guidance, a 22 -gauge needle was inserted to contact bone of the  11th rib on the  right side after which the needle was repositioned at the inferior border of the  11th rib on the right side under fluoroscopic guidance. Following documentation of needle placement, at the inferior border of the  11th rib on the  right side and negative aspiration, a total of 3 mL of 0.25% bupivacaine with Kenalog was injected for  right side for  11th rib intercostal nerve block.   INTERCOSTAL NERVE BLOCKS AT  T0, T9, T8, T7, and T6 LEVELS: The procedure was performed at these levels as was performed at the previous level, T 11, utilizing the same technique and under fluoroscopic guidance  A total of 10 mg Kenalog was utilized for the procedure.   PLAN:   1. Medications: We will continue  presently prescribed medications.  Ultram and Norflex 2. The patient is to follow up with primary care physician for further evaluation of blood pressure and general medical condition as discussed. 3. Surgical evaluation.  4. Neurological evaluation. 5. May consider the patient for additional studies pending response to treatment and follow-up evaluation. 6. May consider radiofrequency procedures, implantation type procedures and other treatment pending response to treatment and follow-up evaluation. 7. The patient has been advised to adhere to proper body mechanics and to call the Pain Management Center prior to scheduled return appointment should there be significant change in condition or have other concerns regarding condition prior to scheduled return appointment.  The patient is understanding and in agreement with suggested treatment plan.     Review of Systems     Objective:   Physical Exam        Assessment & Plan:

## 2014-12-14 NOTE — Progress Notes (Signed)
Safety precautions to be maintained throughout the outpatient stay will include: orient to surroundings, keep bed in low position, maintain call bell within reach at all times, provide assistance with transfer out of bed and ambulation.  

## 2014-12-14 NOTE — Patient Instructions (Addendum)
PLAN   Continue present medication Norflex diclofenac gel and tramadol.   F/U PCP  Plotnikov for evaliation of  BP and general medical  condition  F/U surgical evaluation. Follow-up Dr.Daldorf  and neurosurgical eval as planned   F/U neurological evaluation. May consider pending follow-up evaluations  May consider radiofrequency rhizolysis or intraspinal procedures pending response to present treatment and F/U evaluation   Patient to call Pain Management Center should patient have concerns prior to scheduled return appointment.Selective Nerve Root Block Patient Information  Description: Specific nerve roots exit the spinal canal and these nerves can be compressed and inflamed by a bulging disc and bone spurs.  By injecting steroids on the nerve root, we can potentially decrease the inflammation surrounding these nerves, which often leads to decreased pain.  Also, by injecting local anesthesia on the nerve root, this can provide [place] helpful information to give to your referring doctor if it decreases your pain.  Selective nerve root blocks can be done along the spine from the neck to the low back depending on the location of your pain.   After numbing the skin with local anesthesia, a small needle is passed to the nerve root and the position of the needle is verified using x-ray pictures.  After the needle is in correct position, we then deposit the medication.  You may experience a pressure sensation while this is being done.  The entire block usually lasts less than 15 minutes.  Conditions that may be treated with selective nerve root blocks:  Low back and leg pain  Spinal stenosis  Diagnostic block prior to potential surgery  Neck and arm pain  Post laminectomy syndrome  Preparation for the injection:  1. Do not eat any solid food or dairy products within 6 hours of your appointment. 2. You may drink clear liquids up to 2 hours before an appointment.  Clear liquids include water,  black coffee, juice or soda.  No milk or cream please. 3. You may take your regular medications, including pain medications, with a sip of water before your appointment.  Diabetics should hold regular insulin (if taken separately) and take 1/2 normal NPH dose the morning of the procedure.  Carry some sugar containing items with you to your appointment. 4. A driver must accompany you and be prepared to drive you home after your procedure. 5. Bring all your current medications with you. 6. An IV may be inserted and sedation may be given at the discretion of the physician. 7. A blood pressure cuff, EKG, and other monitors will often be applied during the procedure.  Some patients may need to have extra oxygen administered for a short period. 8. You will be asked to provide medical information, including allergies, prior to the procedure.  We must know immediately if you are taking blood  Thinners (like Coumadin) or if you are allergic to IV iodine contrast (dye).  Possible side-effects: All are usually temporary  Bleeding from needle site  Light headedness  Numbness and tingling  Decreased blood pressure  Weakness in arms/legs  Pressure sensation in back/neck  Pain at injection site (several days)  Possible complications: All are extremely rare  Infection  Nerve injury  Spinal headache (a headache wore with upright position)  Call if you experience:  Fever/chills associated with headache or increased back/neck pain  Headache worsened by an upright position  New onset weakness or numbness of an extremity below the injection site  Hives or difficulty breathing (go to the emergency  room)  Inflammation or drainage at the injection site(s)  Severe back/neck pain greater than usual  New symptoms which are concerning to you  Please note:  Although the local anesthetic injected can often make your back or neck feel good for several hours after the injection the pain will  likely return.  It takes 3-5 days for steroids to work on the nerve root. You may not notice any pain relief for at least one week.  If effective, we will often do a series of 3 injections spaced 3-6 weeks apart to maximally decrease your pain.    If you have any questions, please call [phone] [facility] Clinic[facility] Discharge Instructions  General Discharge Instructions :  If you need to reach your doctor call: Monday-Friday 8:00 am - 4:00 pm at [phone] or toll free [phone].  After clinic hours [phone] to have operator reach doctor.  Bring all of your medication bottles to all your appointments in the pain clinic.  To cancel or reschedule your appointment with Pain Management please remember to call 24 hours in advance to avoid a fee.  Refer to the educational materials which you have been given on: General Risks, I had my Procedure. Discharge Instructions, Post Sedation.  Post Procedure Instructions:  The drugs you were given will stay in your system until tomorrow, so for the next 24 hours you should not drive, make any legal decisions or drink any alcoholic beverages.  You may eat anything you prefer, but it is better to start with liquids then soups and crackers, and gradually work up to solid foods.  Please notify your doctor immediately if you have any unusual bleeding, trouble breathing or pain that is not related to your normal pain.  Depending on the type of procedure that was done, some parts of your body may feel week and/or numb.  This usually clears up by tonight or the next day.  Walk with the use of an assistive device or accompanied by an adult for the 24 hours.  You may use ice on the affected area for the first 24 hours.  Put ice in a Ziploc bag and cover with a towel and place against area 15 minutes on 15 minutes off.  You may switch to heat after 24 hours.

## 2014-12-15 ENCOUNTER — Telehealth: Payer: Self-pay | Admitting: *Deleted

## 2014-12-15 NOTE — Telephone Encounter (Signed)
Left voicemail re; procedure on 12/14/2014, to call our office with any problems or concerns.

## 2014-12-21 ENCOUNTER — Other Ambulatory Visit: Payer: Self-pay | Admitting: Orthopedic Surgery

## 2014-12-21 ENCOUNTER — Telehealth: Payer: Self-pay | Admitting: Psychiatry

## 2014-12-21 DIAGNOSIS — M545 Low back pain: Secondary | ICD-10-CM

## 2014-12-21 DIAGNOSIS — F988 Other specified behavioral and emotional disorders with onset usually occurring in childhood and adolescence: Secondary | ICD-10-CM

## 2014-12-21 MED ORDER — LISDEXAMFETAMINE DIMESYLATE 30 MG PO CAPS: 30.0000 mg | ORAL_CAPSULE | Freq: Every day | ORAL | Status: DC

## 2014-12-21 NOTE — Telephone Encounter (Signed)
left message that rx ready for pick up rx for vyvanse 30 mg id # [id number] order # [id number]

## 2014-12-21 NOTE — Telephone Encounter (Signed)
faxed - confirmed rx for vyvanse 30 mg id # [id number] order # [id number]

## 2014-12-30 ENCOUNTER — Other Ambulatory Visit (INDEPENDENT_AMBULATORY_CARE_PROVIDER_SITE_OTHER): Payer: BLUE CROSS/BLUE SHIELD

## 2014-12-30 DIAGNOSIS — R739 Hyperglycemia, unspecified: Secondary | ICD-10-CM | POA: Diagnosis not present

## 2014-12-30 DIAGNOSIS — D751 Secondary polycythemia: Secondary | ICD-10-CM | POA: Diagnosis not present

## 2014-12-30 DIAGNOSIS — J4520 Mild intermittent asthma, uncomplicated: Secondary | ICD-10-CM | POA: Diagnosis not present

## 2014-12-30 DIAGNOSIS — F319 Bipolar disorder, unspecified: Secondary | ICD-10-CM

## 2014-12-30 DIAGNOSIS — Z0000 Encounter for general adult medical examination without abnormal findings: Secondary | ICD-10-CM

## 2014-12-30 DIAGNOSIS — I10 Essential (primary) hypertension: Secondary | ICD-10-CM

## 2014-12-30 LAB — LIPID PANEL
CHOLESTEROL: 216 mg/dL — AB (ref 0–200)
HDL: 37.5 mg/dL — ABNORMAL LOW (ref >39.00)
LDL Cholesterol: 145 mg/dL — ABNORMAL HIGH (ref 0–99)
NonHDL: 178.08
Total CHOL/HDL Ratio: 6
Triglycerides: 164 mg/dL — ABNORMAL HIGH (ref 0.0–149.0)
VLDL: 32.8 mg/dL (ref 0.0–40.0)

## 2014-12-30 LAB — CBC WITH DIFFERENTIAL/PLATELET
Basophils Absolute: 0.1 10*3/uL (ref 0.0–0.1)
Basophils Relative: 0.7 % (ref 0.0–3.0)
EOS PCT: 2.7 % (ref 0.0–5.0)
Eosinophils Absolute: 0.2 10*3/uL (ref 0.0–0.7)
HCT: 48.6 % — ABNORMAL HIGH (ref 36.0–46.0)
Hemoglobin: 16.2 g/dL — ABNORMAL HIGH (ref 12.0–15.0)
LYMPHS ABS: 2.7 10*3/uL (ref 0.7–4.0)
Lymphocytes Relative: 31.3 % (ref 12.0–46.0)
MCHC: 33.4 g/dL (ref 30.0–36.0)
MCV: 89 fl (ref 78.0–100.0)
MONO ABS: 1.1 10*3/uL — AB (ref 0.1–1.0)
MONOS PCT: 13.3 % — AB (ref 3.0–12.0)
NEUTROS ABS: 4.5 10*3/uL (ref 1.4–7.7)
NEUTROS PCT: 52 % (ref 43.0–77.0)
Platelets: 269 10*3/uL (ref 150.0–400.0)
RBC: 5.46 Mil/uL — ABNORMAL HIGH (ref 3.87–5.11)
RDW: 14.8 % (ref 11.5–15.5)
WBC: 8.6 10*3/uL (ref 4.0–10.5)

## 2014-12-30 LAB — BASIC METABOLIC PANEL
BUN: 13 mg/dL (ref 6–23)
CHLORIDE: 101 meq/L (ref 96–112)
CO2: 29 meq/L (ref 19–32)
CREATININE: 0.71 mg/dL (ref 0.40–1.20)
Calcium: 9.5 mg/dL (ref 8.4–10.5)
GFR: 95.91 mL/min (ref >60.00)
Glucose, Bld: 99 mg/dL (ref 70–99)
Potassium: 4.6 mEq/L (ref 3.5–5.1)
Sodium: 137 mEq/L (ref 135–145)

## 2014-12-30 LAB — URINALYSIS
Bilirubin Urine: NEGATIVE
HGB URINE DIPSTICK: NEGATIVE
Ketones, ur: NEGATIVE
Leukocytes, UA: NEGATIVE
NITRITE: NEGATIVE
Specific Gravity, Urine: <=1.005 — AB (ref 1.000–1.030)
TOTAL PROTEIN, URINE-UPE24: NEGATIVE
Urine Glucose: NEGATIVE
Urobilinogen, UA: 0.2 (ref 0.0–1.0)
pH: 6 (ref 5.0–8.0)

## 2014-12-30 LAB — HEMOGLOBIN A1C: HEMOGLOBIN A1C: 6 % (ref 4.6–6.5)

## 2014-12-30 LAB — TSH: TSH: 2.45 u[IU]/mL (ref 0.35–4.50)

## 2015-01-04 ENCOUNTER — Ambulatory Visit (INDEPENDENT_AMBULATORY_CARE_PROVIDER_SITE_OTHER): Payer: BLUE CROSS/BLUE SHIELD | Admitting: Internal Medicine

## 2015-01-04 ENCOUNTER — Encounter: Payer: Self-pay | Admitting: Internal Medicine

## 2015-01-04 VITALS — BP 120/84 | HR 88 | Wt 202.0 lb

## 2015-01-04 DIAGNOSIS — F5102 Adjustment insomnia: Secondary | ICD-10-CM

## 2015-01-04 DIAGNOSIS — J4520 Mild intermittent asthma, uncomplicated: Secondary | ICD-10-CM | POA: Diagnosis not present

## 2015-01-04 DIAGNOSIS — F319 Bipolar disorder, unspecified: Secondary | ICD-10-CM

## 2015-01-04 DIAGNOSIS — E559 Vitamin D deficiency, unspecified: Secondary | ICD-10-CM

## 2015-01-04 DIAGNOSIS — M170 Bilateral primary osteoarthritis of knee: Secondary | ICD-10-CM | POA: Diagnosis not present

## 2015-01-04 MED ORDER — ZOLPIDEM TARTRATE 10 MG PO TABS: 10.0000 mg | ORAL_TABLET | Freq: Every evening | ORAL | Status: DC | PRN

## 2015-01-04 NOTE — Assessment & Plan Note (Signed)
No issues lately

## 2015-01-04 NOTE — Assessment & Plan Note (Signed)
On Vit D 

## 2015-01-04 NOTE — Assessment & Plan Note (Signed)
Chronic B Handicapped form Tramadol prn., Norco prn -- Dr Gerald Stabs (Pain Management)  Potential benefits of a long term opioids use as well as potential risks (i.e. addiction risk, apnea etc) and complications (i.e. Somnolence, constipation and others) were explained to the patient and were aknowledged.

## 2015-01-04 NOTE — Assessment & Plan Note (Addendum)
Working part time per vocation rehab Rx per Psychiatry - Dr Jimmye Norman On Abilify, Cymbalta, Cogentin

## 2015-01-04 NOTE — Progress Notes (Signed)
Pre visit review using our clinic review tool, if applicable. No additional management support is needed unless otherwise documented below in the visit note. 

## 2015-01-04 NOTE — Progress Notes (Signed)
Subjective:  Patient ID: [name], female    DOB: 02-10-1970  Age: 44 y.o. MRN: [id number]  CC: No chief complaint on file.   HPI [name] presents for OA knees, bipolar depression, asthma f/u. C/o insomnia - using an old [place]. Working part time now...  Outpatient Prescriptions Prior to Visit  Medication Sig Dispense Refill  . albuterol (PROAIR HFA) 108 (90 BASE) MCG/ACT inhaler Inhale 2 puffs into the lungs every 4 (four) hours as needed. For shortness of breath. 18 g 2  . albuterol (PROVENTIL) (2.5 MG/3ML) 0.083% nebulizer solution Take 3 mLs (2.5 mg total) by nebulization every 6 (six) hours as needed. For shortness of breath. 75 mL 0  . ARIPiprazole (ABILIFY) 30 MG tablet Take one tablet by mouth one time daily 30 tablet 2  . aspirin (BAYER ASPIRIN) 325 MG tablet Take 1 tablet (325 mg total) by mouth daily. 100 tablet 3  . benztropine (COGENTIN) 1 MG tablet Take 1 tablet (1 mg total) by mouth at bedtime. 30 tablet 2  . budesonide-formoterol (SYMBICORT) 160-4.5 MCG/ACT inhaler Inhale 2 puffs into the lungs 2 (two) times daily. 1 Inhaler 2  . diazepam (VALIUM) 5 MG tablet TAKE 1 TABLET DAILY AND TAKE SECOND TABLET IF NEEDED 45 tablet 2  . diclofenac sodium (VOLTAREN) 1 % GEL Apply 2-4 mg to painful area of skin 4 times per day if tolerated . Limit 4 grams per application [id number] g 2  . DULoxetine (CYMBALTA) 30 MG capsule Two capsules in the morning, and one capsule in the evening. 90 capsule 3  . eletriptan (RELPAX) 40 MG tablet Take 1 tablet (40 mg total) by mouth as needed for migraine or headache. May repeat in 2 hours if headache persists or recurs. 15 tablet 6  . HYDROcodone-acetaminophen (VICODIN) 5-325 mg TABS tablet Limit 1/2-1 tablet by mouth per day or twice a day  Limit 30 per month 30 tablet 0  . Linaclotide (LINZESS) 145 MCG CAPS capsule Take 1-2 capsules (145-290 mcg total) by mouth daily as needed. 60 capsule 11  . lisdexamfetamine (VYVANSE) 30 MG capsule Take 1  capsule (30 mg total) by mouth daily. 30 capsule 0  . meloxicam (MOBIC) 15 MG tablet Take 1 tablet (15 mg total) by mouth daily as needed for pain. 30 tablet 5  . metoprolol (LOPRESSOR) 100 MG tablet Take 1 tablet (100 mg total) by mouth 2 (two) times daily. 180 tablet 3  . nicotine (NICODERM CQ) 14 mg/24hr patch Place 1 patch (14 mg total) onto the skin daily. 28 patch 0  . nortriptyline (PAMELOR) 10 MG capsule Will start when finishes the 30mg  po daily dosing.    . orphenadrine (NORFLEX) 100 MG tablet Limit 1 tab by mouth per day or twice per day if tolerated 60 tablet 2  . pantoprazole (PROTONIX) 40 MG tablet Take 1 tablet (40 mg total) by mouth daily. 90 tablet 3  . pregabalin (LYRICA) 50 MG capsule Take 1 capsule (50 mg total) by mouth 3 (three) times daily. 90 capsule 1  . ranitidine (ZANTAC) 300 MG tablet Take 1 tablet (300 mg total) by mouth at bedtime. 90 tablet 3  . traMADol (ULTRAM) 50 MG tablet Limit 1 tablet by mouth 4 - 8  times per day if tolerated 240 tablet 0  . Vitamin D, Ergocalciferol, (DRISDOL) 50000 UNITS CAPS capsule Take 1 capsule (50,000 Units total) by mouth every 7 (seven) days. 8 capsule 0   Facility-Administered Medications Prior to Visit  Medication Dose Route Frequency Provider Last Rate Last Dose  . bupivacaine (MARCAINE) 0.5 % injection 30 mL  30 mL Other Once Mohammed Kindle, MD      . bupivacaine (PF) (MARCAINE) 0.25 % injection 30 mL  30 mL Other Once Mohammed Kindle, MD      . fentaNYL (SUBLIMAZE) injection 100 mcg  100 mcg Intravenous Once Mohammed Kindle, MD      . lactated ringers infusion 1,000 mL  1,000 mL Intravenous Continuous Mohammed Kindle, MD      . midazolam (VERSED) 5 MG/5ML injection 5 mg  5 mg Intravenous Once Mohammed Kindle, MD      . orphenadrine (NORFLEX) injection 60 mg  60 mg Intramuscular Once Mohammed Kindle, MD      . orphenadrine (NORFLEX) injection 60 mg  60 mg Intramuscular Once Mohammed Kindle, MD      . triamcinolone acetonide (KENALOG-40)  injection 40 mg  40 mg Other Once Mohammed Kindle, MD      . triamcinolone acetonide (KENALOG-40) injection 40 mg  40 mg Other Once Mohammed Kindle, MD        ROS Review of Systems  Constitutional: Negative for chills, activity change, appetite change, fatigue and unexpected weight change.  HENT: Negative for congestion, mouth sores and sinus pressure.   Eyes: Negative for visual disturbance.  Respiratory: Negative for cough and chest tightness.   Cardiovascular: Negative for leg swelling.  Gastrointestinal: Negative for nausea and abdominal pain.  Genitourinary: Negative for frequency, difficulty urinating and vaginal pain.  Musculoskeletal: Positive for back pain and arthralgias. Negative for gait problem.  Skin: Negative for pallor and rash.  Neurological: Negative for dizziness, tremors, weakness, numbness and headaches.  Psychiatric/Behavioral: Positive for sleep disturbance. Negative for suicidal ideas and confusion. The patient is nervous/anxious.     Objective:  BP 120/84 mmHg  Pulse 88  Wt 202 lb (91.627 kg)  SpO2 97%  BP Readings from Last 3 Encounters:  01/04/15 120/84  12/14/14 118/68  12/13/14 130/97    Wt Readings from Last 3 Encounters:  01/04/15 202 lb (91.627 kg)  12/14/14 198 lb (89.812 kg)  12/13/14 198 lb (89.812 kg)    Physical Exam  Constitutional: She appears well-developed. No distress.  HENT:  Head: Normocephalic.  Right Ear: External ear normal.  Left Ear: External ear normal.  Nose: Nose normal.  Mouth/Throat: Oropharynx is clear and moist.  Eyes: Conjunctivae are normal. Pupils are equal, round, and reactive to light. Right eye exhibits no discharge. Left eye exhibits no discharge.  Neck: Normal range of motion. Neck supple. No JVD present. No tracheal deviation present. No thyromegaly present.  Cardiovascular: Normal rate, regular rhythm and normal heart sounds.   Pulmonary/Chest: No stridor. No respiratory distress. She has no wheezes.    Abdominal: Soft. Bowel sounds are normal. She exhibits no distension and no mass. There is no tenderness. There is no rebound and no guarding.  Musculoskeletal: She exhibits tenderness. She exhibits no edema.  Lymphadenopathy:    She has no cervical adenopathy.  Neurological: She displays normal reflexes. No cranial nerve deficit. She exhibits normal muscle tone. Coordination normal.  Skin: No rash noted. No erythema.  Psychiatric: She has a normal mood and affect. Her behavior is normal. Judgment and thought content normal.    Lab Results  Component Value Date   WBC 8.6 12/30/2014   HGB 16.2* 12/30/2014   HCT 48.6* 12/30/2014   PLT 269.0 12/30/2014   GLUCOSE 99 12/30/2014   CHOL 216* 12/30/2014  TRIG 164.0* 12/30/2014   HDL 37.50* 12/30/2014   LDLDIRECT 161.1 02/18/2013   LDLCALC 145* 12/30/2014   ALT 18 09/06/2014   AST 15 09/06/2014   NA 137 12/30/2014   K 4.6 12/30/2014   CL 101 12/30/2014   CREATININE 0.71 12/30/2014   BUN 13 12/30/2014   CO2 29 12/30/2014   TSH 2.45 12/30/2014   INR 1.05 08/29/2014   HGBA1C 6.0 12/30/2014    No results found.  Assessment & Plan:   Diagnoses and all orders for this visit:  Bipolar 1 disorder ([place])  Asthma, mild intermittent, uncomplicated  Primary osteoarthritis of both knees  Vitamin D deficiency  Insomnia due to stress  Other orders -     zolpidem (AMBIEN) 10 MG tablet; Take 1 tablet (10 mg total) by mouth at bedtime as needed for sleep.   I am having Ms. Noller start on zolpidem. I am also having her maintain her aspirin, pantoprazole, ranitidine, budesonide-formoterol, albuterol, albuterol, Vitamin D (Ergocalciferol), eletriptan, nicotine, Linaclotide, diclofenac sodium, pregabalin, meloxicam, DULoxetine, metoprolol, nortriptyline, diazepam, ARIPiprazole, benztropine, traMADol, orphenadrine, HYDROcodone-acetaminophen, lisdexamfetamine, and nortriptyline. We will continue to administer bupivacaine (PF), orphenadrine,  triamcinolone acetonide, bupivacaine, fentaNYL, lactated ringers, midazolam, orphenadrine, and triamcinolone acetonide.  Meds ordered this encounter  Medications  . nortriptyline (PAMELOR) 25 MG capsule    Sig: Take 1 capsule by mouth at bedtime.  [name] zolpidem (AMBIEN) 10 MG tablet    Sig: Take 1 tablet (10 mg total) by mouth at bedtime as needed for sleep.    Dispense:  30 tablet    Refill:  0     Follow-up: Return in about 4 months (around 05/05/2015) for a follow-up visit.  Walker Kehr, MD

## 2015-01-04 NOTE — Assessment & Plan Note (Signed)
12/16 discussed Zolpidem x 1 mo, further Rx per Dr Jimmye Norman

## 2015-01-10 ENCOUNTER — Encounter: Payer: Self-pay | Admitting: Pain Medicine

## 2015-01-10 ENCOUNTER — Ambulatory Visit: Payer: BLUE CROSS/BLUE SHIELD | Attending: Pain Medicine | Admitting: Pain Medicine

## 2015-01-10 VITALS — BP 116/85 | HR 72 | Temp 98.1°F | Resp 16 | Ht 66.0 in | Wt 199.0 lb

## 2015-01-10 DIAGNOSIS — M5459 Other low back pain: Secondary | ICD-10-CM

## 2015-01-10 DIAGNOSIS — M47816 Spondylosis without myelopathy or radiculopathy, lumbar region: Secondary | ICD-10-CM

## 2015-01-10 DIAGNOSIS — M170 Bilateral primary osteoarthritis of knee: Secondary | ICD-10-CM

## 2015-01-10 DIAGNOSIS — M47894 Other spondylosis, thoracic region: Secondary | ICD-10-CM

## 2015-01-10 DIAGNOSIS — G588 Other specified mononeuropathies: Secondary | ICD-10-CM

## 2015-01-10 DIAGNOSIS — M172 Bilateral post-traumatic osteoarthritis of knee: Secondary | ICD-10-CM

## 2015-01-10 DIAGNOSIS — M5136 Other intervertebral disc degeneration, lumbar region: Secondary | ICD-10-CM | POA: Diagnosis not present

## 2015-01-10 DIAGNOSIS — M47814 Spondylosis without myelopathy or radiculopathy, thoracic region: Secondary | ICD-10-CM | POA: Diagnosis not present

## 2015-01-10 DIAGNOSIS — M533 Sacrococcygeal disorders, not elsewhere classified: Secondary | ICD-10-CM | POA: Diagnosis not present

## 2015-01-10 DIAGNOSIS — M5124 Other intervertebral disc displacement, thoracic region: Secondary | ICD-10-CM | POA: Diagnosis not present

## 2015-01-10 DIAGNOSIS — M79604 Pain in right leg: Secondary | ICD-10-CM

## 2015-01-10 DIAGNOSIS — M62830 Muscle spasm of back: Secondary | ICD-10-CM | POA: Diagnosis present

## 2015-01-10 DIAGNOSIS — M5134 Other intervertebral disc degeneration, thoracic region: Secondary | ICD-10-CM

## 2015-01-10 DIAGNOSIS — M51369 Other intervertebral disc degeneration, lumbar region without mention of lumbar back pain or lower extremity pain: Secondary | ICD-10-CM

## 2015-01-10 DIAGNOSIS — M5481 Occipital neuralgia: Secondary | ICD-10-CM

## 2015-01-10 DIAGNOSIS — M545 Low back pain: Secondary | ICD-10-CM

## 2015-01-10 DIAGNOSIS — M5137 Other intervertebral disc degeneration, lumbosacral region: Secondary | ICD-10-CM

## 2015-01-10 DIAGNOSIS — M51379 Other intervertebral disc degeneration, lumbosacral region without mention of lumbar back pain or lower extremity pain: Secondary | ICD-10-CM

## 2015-01-10 DIAGNOSIS — M79605 Pain in left leg: Secondary | ICD-10-CM

## 2015-01-10 DIAGNOSIS — Z7982 Long term (current) use of aspirin: Secondary | ICD-10-CM

## 2015-01-10 DIAGNOSIS — M546 Pain in thoracic spine: Secondary | ICD-10-CM | POA: Diagnosis present

## 2015-01-10 MED ORDER — ORPHENADRINE CITRATE ER 100 MG PO TB12: ORAL_TABLET | ORAL | Status: DC

## 2015-01-10 MED ORDER — HYDROCODONE-ACETAMINOPHEN 5-325MG PREPACK (~~LOC~~: ORAL_TABLET | ORAL | Status: DC

## 2015-01-10 MED ORDER — TRAMADOL HCL 50 MG PO TABS: ORAL_TABLET | ORAL | Status: DC

## 2015-01-10 NOTE — Progress Notes (Signed)
Subjective:    Patient ID: [name], female    DOB: [birth year], 44 y.o.   MRN: [id number]  HPI  Patient is a 44 year old female who returns to pain management Center for further evaluation and treatment of pain involving the mid back with severe muscle spasms and thoracic pain with pain of the lumbar lower extremity region in knees as well. The patient is scheduled to undergo follow-up surgical evaluation and is scheduled to undergo updated MRIs at this time. We discussed patient's overall condition patient states that she has had significant improvement of pain of the thoracic region with the decrease of the spasms of the thoracic region following intercostal nerve blocks. We will continue presently prescribed medications and will consider modification of treatment regimen pending results of MRI as well as follow-up surgical evaluation surgical disposition. The patient was understanding and agreement suggested treatment plan. The patient denies any trauma change in events of daily living the call significant changes of the pathology. 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 We have noted the physical demands of caring for patient's mother and have caution patient regarding strenuous physical activity which could exacerbate patient's symptoms. The patient was understanding and will attempt to avoid strenuous activity exacerbate her condition. Patient will follow-up surgical evaluation and we will consider patient treatment regimen pending follow-up evaluation as discussed. Patient was understanding and agreement suggested  Review of Systems     Objective:   Physical Exam   There was tends to palpation of the splenius capitis and occipitalis musculature region of mild degree with palpation of the acromioclavicular and glenohumeral joint regions reproducing minimal discomfort. Patient appeared to be with bilaterally equal grip strength. Tinel and Phalen's  maneuver without increased pain of significant degree. There was tenderness of the thoracic facet thoracic paraspinal musculature region with crepitus of the thoracic region noted. There was minimal muscle spasms of the thoracic region noted. Palpation over the lumbar paraspinal must reason lumbar facet region was with moderate discomfort. Lateral bending rotation extension and palpation of the lumbar facets reproduced mild to moderate discomfort. There was mild tendons over the PSIS PII S region as well as the gluteal and piriformis musculature region with mild tenderness of the greater trochanteric region iliotibial band region. Straight leg raising was tolerates approximately 30 without increased pain with dorsiflexion noted. The knees were without increased warmth erythema of the knees there was negative anterior and posterior drawer signs without ballottement of the patella. EHL strength presently decreased without sensory deficit or dermatomal dystrophy detected. Negative clonus negative Homans. Mild tenderness of the greater trochanteric region iliotibial band region. Abdomen nontender with no costovertebral tenderness noted.      Assessment & Plan:    Intercostal neuralgia with severe muscle spasms thoracic region  Degenerative changes thoracic spine is with most significant involvement T8-T9 with chronic endplate changes and height loss of the vertebral bodies of the mid and lower thoracic spine region, posterior disc bulging seen in the lower thoracic region.  Thoracic facet syndro  Degenerative joint disease of knees  Degenerative disc disease lumbar spine Degenerative changes L3-4, L4-5, and L5-S1, degenerative changes  Lumbar facet syndrome  Sacroiliac joint dysfunction    PLAN    Continue present medication Norflex hydrocodone acetaminophen diclofenac gel and tramadol.   F/U PCP  [facility] for evaliation of  BP and general medical  condition  F/U surgical evaluation.  Follow-up Dr.Daldorf  and neurosurgical eval as planned Follow-up Dr.Dumonski as  planned  Thoracic and lumbar MRI's as scheduled   F/U neurological evaluation. May consider pending follow-up evaluations  May consider radiofrequency rhizolysis or intraspinal procedures pending response to present treatment and F/U evaluation   Patient to call Pain Management Center should patient have concerns prior to scheduled return appointment.

## 2015-01-10 NOTE — Progress Notes (Signed)
Safety precautions to be maintained throughout the outpatient stay will include: orient to surroundings, keep bed in low position, maintain call bell within reach at all times, provide assistance with transfer out of bed and ambulation.  

## 2015-01-10 NOTE — Patient Instructions (Addendum)
PLAN   Continue present medication Norflex hydrocodone acetaminophen diclofenac gel and tramadol.   F/U PCP  [facility] for evaliation of  BP and general medical  condition  F/U surgical evaluation. Follow-up Dr.Daldorf  and neurosurgical eval as planned Follow-up Dr.Dumonski as planned  Thoracic and lumbar MRI's as scheduled   F/U neurological evaluation. May consider pending follow-up evaluations  May consider radiofrequency rhizolysis or intraspinal procedures pending response to present treatment and F/U evaluation   Patient to call Pain Management Center should patient have concerns prior to scheduled return appointment.

## 2015-01-12 ENCOUNTER — Ambulatory Visit
Admission: RE | Admit: 2015-01-12 | Discharge: 2015-01-12 | Disposition: A | Discharge status: Home / Self Care | Payer: BLUE CROSS/BLUE SHIELD | Source: Ambulatory Visit | Attending: Orthopedic Surgery | Admitting: Orthopedic Surgery

## 2015-01-12 ENCOUNTER — Other Ambulatory Visit: Payer: Self-pay | Admitting: Family Medicine

## 2015-01-12 ENCOUNTER — Ambulatory Visit: Payer: BLUE CROSS/BLUE SHIELD

## 2015-01-12 DIAGNOSIS — M5134 Other intervertebral disc degeneration, thoracic region: Secondary | ICD-10-CM | POA: Insufficient documentation

## 2015-01-12 DIAGNOSIS — M1288 Other specific arthropathies, not elsewhere classified, other specified site: Secondary | ICD-10-CM | POA: Insufficient documentation

## 2015-01-12 DIAGNOSIS — M5127 Other intervertebral disc displacement, lumbosacral region: Secondary | ICD-10-CM | POA: Insufficient documentation

## 2015-01-12 DIAGNOSIS — M47816 Spondylosis without myelopathy or radiculopathy, lumbar region: Secondary | ICD-10-CM | POA: Insufficient documentation

## 2015-01-12 DIAGNOSIS — M545 Low back pain: Secondary | ICD-10-CM

## 2015-01-12 DIAGNOSIS — M546 Pain in thoracic spine: Secondary | ICD-10-CM | POA: Diagnosis present

## 2015-01-12 DIAGNOSIS — M4804 Spinal stenosis, thoracic region: Secondary | ICD-10-CM | POA: Diagnosis not present

## 2015-01-15 ENCOUNTER — Telehealth: Payer: Self-pay | Admitting: Adult Health

## 2015-01-15 DIAGNOSIS — N3000 Acute cystitis without hematuria: Secondary | ICD-10-CM

## 2015-01-15 MED ORDER — CIPROFLOXACIN HCL 500 MG PO TABS: 500.0000 mg | ORAL_TABLET | Freq: Two times a day (BID) | ORAL | Status: DC

## 2015-01-15 NOTE — Progress Notes (Signed)

## 2015-02-01 ENCOUNTER — Ambulatory Visit: Payer: BLUE CROSS/BLUE SHIELD | Admitting: Psychiatry

## 2015-02-02 ENCOUNTER — Encounter: Payer: Self-pay | Admitting: Psychiatry

## 2015-02-02 ENCOUNTER — Ambulatory Visit (INDEPENDENT_AMBULATORY_CARE_PROVIDER_SITE_OTHER): Payer: Commercial Indemnity | Admitting: Psychiatry

## 2015-02-02 VITALS — BP 118/78 | HR 70 | Temp 97.0°F | Ht 66.0 in | Wt 201.4 lb

## 2015-02-02 DIAGNOSIS — F988 Other specified behavioral and emotional disorders with onset usually occurring in childhood and adolescence: Secondary | ICD-10-CM

## 2015-02-02 DIAGNOSIS — F319 Bipolar disorder, unspecified: Secondary | ICD-10-CM | POA: Diagnosis not present

## 2015-02-02 DIAGNOSIS — F909 Attention-deficit hyperactivity disorder, unspecified type: Secondary | ICD-10-CM

## 2015-02-02 MED ORDER — DULOXETINE HCL 30 MG PO CPEP: ORAL_CAPSULE | ORAL | Status: DC

## 2015-02-02 MED ORDER — LISDEXAMFETAMINE DIMESYLATE 30 MG PO CAPS: 30.0000 mg | ORAL_CAPSULE | Freq: Every day | ORAL | Status: DC

## 2015-02-02 MED ORDER — BENZTROPINE MESYLATE 1 MG PO TABS: 1.0000 mg | ORAL_TABLET | Freq: Every day | ORAL | Status: DC

## 2015-02-02 MED ORDER — ZOLPIDEM TARTRATE 10 MG PO TABS: 10.0000 mg | ORAL_TABLET | Freq: Every evening | ORAL | Status: DC | PRN

## 2015-02-02 MED ORDER — DIAZEPAM 5 MG PO TABS: 5.0000 mg | ORAL_TABLET | Freq: Two times a day (BID) | ORAL | Status: DC | PRN

## 2015-02-02 NOTE — Progress Notes (Signed)
[place] MD/PA/NP OP Progress Note  02/02/2015 3:37 PM [name]  MRN:  [id number]  Subjective:  Patient returns a follow-up or bipolar type II, generalized anxiety sort and ADHD. Asian indicated that she just made up her mind she wants to go back to work. She states she's had 3 job interviews however's is been some frustrations with the processes. She states that her job that she was interested in the most she went for the interview and then they requested a drug test. Patient indicates she disclosed that she is on stimulants and thus there is going to be a positive result. She states that the person in reviewing her said the process will stop right here. However patient was allowed to submit her drug test and had been informed that once it comes back positive the lab contact her for information clarifying she is prescribed a medication.  Patient  indicated she would just want a happy pill. I explained her that typically that's not the way the medication works but that it can decrease feelings of depression and anxiety. She is able to state that although she does not feel great she is not in a major depression. She states she does have drive spit periodically and for example she states that she got dressed today to go the gym but she did not make it there. I discussed that at this point in time I did not want to make changes because I'm going to be leaving the practice and did not want to have her on new medications or requiring adjustments prior to her follow-up. In addition I've discussed with her before that she is on numerous medications and I want to refrain from adding anything additional. Appears her primary care wrote her for prescription of Ambien however deferred any future prescriptions to writer. Chief Complaint: S Chief Complaint    Follow-up; Medication Refill; Insomnia     Visit Diagnosis:     ICD-9-CM ICD-10-CM   1. Bipolar 1 disorder (HCC) 296.7 F31.9 benztropine (COGENTIN) 1 MG tablet   2. ADD (attention deficit disorder) 314.00 F90.9 lisdexamfetamine (VYVANSE) 30 MG capsule     DISCONTINUED: lisdexamfetamine (VYVANSE) 30 MG capsule     DISCONTINUED: lisdexamfetamine (VYVANSE) 30 MG capsule    Past Medical History:  Past Medical History  Diagnosis Date  . Attention deficit disorder without mention of hyperactivity   . Anxiety state, unspecified   . Pain in joint, site unspecified   . Unspecified asthma(493.90)   . Migraine without aura, without mention of intractable migraine without mention of status migrainosus   . Depressive disorder, not elsewhere classified   . Unspecified essential hypertension   . Unspecified vitamin D deficiency   . Hypertension   . Esophageal reflux     Past Surgical History  Procedure Laterality Date  . Partial hysterectomy  2007  . Tonsillectomy  04/29/00  . Incise and drain abcess  2011    Right axilla- Dr. Barkley Bruns  . Abdominal ultrasound  10/12/97  . Plantar fascia surgery  2000  . Transthoracic echocardiogram  11/2009    EF=>55%; trace MR & TR  . Nm myocar perf wall motion  11/2009    bruce myoview; perfusion defect in anterior region consistent with breast attenuation; remaining myocardium with normal perfusion; post-stress EF 74%; low risk scan   . Appendectomy  05/27/2013    gangrenous  . Abdominal hysterectomy    . Tonsillectomy Bilateral   . Knee arthroscopy      both  knees   Family History:  Family History  Problem Relation Age of Onset  . Asthma      Family history  . Coronary artery disease      1st degree female < 50  . Diabetes      1st degree relative  . Breast cancer Mother   . Multiple sclerosis Mother   . Cancer Mother 75    breast ca  . Depression Mother   . Lupus Sister     PTSD  . Alcohol abuse Sister   . Bipolar disorder Sister   . Alcohol abuse Brother     also HTN, lupus, enlarged heart  . Heart disease Father     cardiac arrest  . Heart disease Maternal Grandfather     cardiac arrest  .  COPD Paternal Grandmother   . Diabetes Paternal Grandfather   . Heart disease Paternal Grandfather   . ADD / ADHD Child   . Asthma Child    Social History:  Social History   Social History  . Marital Status: Married    Spouse Name: N/A  . Number of Children: 2  . Years of Education: N/A   Occupational History  . 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 Smoking status: Former Smoker -- 1.00 packs/day for 27 years    Types: Cigarettes    Start date: 10/31/1986    Quit date: 11/22/2014  . Smokeless tobacco: Never Used  . Alcohol Use: No  . Drug Use: No  . Sexual Activity: Yes    Birth Control/ Protection: None   Other Topics Concern  . None   Social History Narrative   Patient lives at home with her mother.   Disabled   Right handed   Caffeine three cups daily   Some college education   Additional History:   Assessment:   Musculoskeletal:  Strength & Muscle Tone: within normal limits Gait & Station: normal Patient leans: N/A  Psychiatric Specialty Exam: Insomnia PMH includes: no depression.    Review of Systems  Psychiatric/Behavioral: Negative for depression, suicidal ideas, hallucinations, memory loss and substance abuse. The patient is nervous/anxious and has insomnia.   All other systems reviewed and are negative.   Blood pressure 118/78, pulse 70, temperature 97 F (36.1 C), temperature source Tympanic, height 5\' 6"  (1.676 m), weight 201 lb 6.4 oz (91.354 kg), SpO2 96 %.Body mass index is 32.52 kg/(m^2).  General Appearance: Well Groomed  Eye Contact:  Good  Speech:  Normal Rate  Volume:  Normal  Mood:  okay  Affect:  Anxious, but bright and able to smile  Thought Process:  Linear  Orientation:  Full (Time, Place, and Person)  Thought Content:  Negative  Suicidal Thoughts:  No  Homicidal Thoughts:  No  Memory:  Immediate;   Good Recent;   Good Remote;   Good  Judgement:   Good  Insight:  Good  Psychomotor Activity:  Negative  Concentration:  Good  Recall:  Good  Fund of Knowledge: Good  Language: Good  Akathisia:  Negative  Handed:    AIMS (if indicated):  Done 12/05/14 normal  Assets:  Communication Skills Desire for Improvement Social Support  ADL's:  Intact  Cognition: WNL  Sleep:  good   Is the patient at risk to self?  No. Has the patient been a risk to self in the past 6 months?  No.  Has the patient been a risk to self within the distant past?  No. Is the patient a risk to others?  No. Has the patient been a risk to others in the past 6 months?  No. Has the patient been a risk to others within the distant past?  No.  Current Medications: Current Outpatient Prescriptions  Medication Sig Dispense Refill  . albuterol (PROAIR HFA) 108 (90 BASE) MCG/ACT inhaler Inhale 2 puffs into the lungs every 4 (four) hours as needed. For shortness of breath. 18 g 2  . albuterol (PROVENTIL) (2.5 MG/3ML) 0.083% nebulizer solution Take 3 mLs (2.5 mg total) by nebulization every 6 (six) hours as needed. For shortness of breath. 75 mL 0  . ARIPiprazole (ABILIFY) 30 MG tablet Take one tablet by mouth one time daily 30 tablet 2  . aspirin (BAYER ASPIRIN) 325 MG tablet Take 1 tablet (325 mg total) by mouth daily. 100 tablet 3  . benztropine (COGENTIN) 1 MG tablet Take 1 tablet (1 mg total) by mouth at bedtime. 30 tablet 4  . budesonide-formoterol (SYMBICORT) 160-4.5 MCG/ACT inhaler Inhale 2 puffs into the lungs 2 (two) times daily. 1 Inhaler 2  . ciprofloxacin (CIPRO) 500 MG tablet Take 1 tablet (500 mg total) by mouth 2 (two) times daily. 10 tablet 0  . diazepam (VALIUM) 5 MG tablet Take 1 tablet (5 mg total) by mouth 2 (two) times daily as needed for anxiety. 60 tablet 4  . diclofenac sodium (VOLTAREN) 1 % GEL Apply 2-4 mg to painful area of skin 4 times per day if tolerated . Limit 4 grams per application [id number] g 2  . DULoxetine (CYMBALTA) 30 MG capsule Two capsules  in the morning, and one capsule in the evening. 90 capsule 4  . eletriptan (RELPAX) 40 MG tablet Take 1 tablet (40 mg total) by mouth as needed for migraine or headache. May repeat in 2 hours if headache persists or recurs. 15 tablet 6  . HYDROcodone-acetaminophen (VICODIN) 5-325 mg TABS tablet Limit 1/2-1 tablet by mouth per day or twice a day  Limit 30 per month 30 tablet 0  . Linaclotide (LINZESS) 145 MCG CAPS capsule Take 1-2 capsules (145-290 mcg total) by mouth daily as needed. 60 capsule 11  . lisdexamfetamine (VYVANSE) 30 MG capsule Take 1 capsule (30 mg total) by mouth daily. 30 capsule 0  . meloxicam (MOBIC) 15 MG tablet Take 1 tablet (15 mg total) by mouth daily as needed for pain. 30 tablet 5  . metoprolol (LOPRESSOR) 100 MG tablet Take 1 tablet (100 mg total) by mouth 2 (two) times daily. 180 tablet 3  . nicotine (NICODERM CQ) 14 mg/24hr patch Place 1 patch (14 mg total) onto the skin daily. 28 patch 0  . nortriptyline (PAMELOR) 10 MG capsule Will start when finishes the 30mg  po daily dosing.    . nortriptyline (PAMELOR) 25 MG capsule Take 1 capsule by mouth at bedtime.    . orphenadrine (NORFLEX) 100 MG tablet Limit 1 tab by mouth per day or twice per day if tolerated 60 tablet 2  . pantoprazole (PROTONIX) 40 MG tablet Take 1 tablet (40 mg total) by mouth daily. 90 tablet 3  . pregabalin (LYRICA) 50 MG capsule Take 1 capsule (50 mg total) by mouth 3 (three) times daily. 90 capsule 1  . ranitidine (ZANTAC) 300 MG tablet Take 1 tablet (300 mg total) by mouth at bedtime. 90 tablet 3  . traMADol (ULTRAM) 50 MG tablet Limit 1 tablet by mouth 4 -  8  times per day if tolerated 240 tablet 0  . Vitamin D, Ergocalciferol, (DRISDOL) 50000 UNITS CAPS capsule Take 1 capsule (50,000 Units total) by mouth every 7 (seven) days. 8 capsule 0  . zolpidem (AMBIEN) 10 MG tablet Take 1 tablet (10 mg total) by mouth at bedtime as needed for sleep. 30 tablet 4   Current Facility-Administered Medications   Medication Dose Route Frequency Provider Last Rate Last Dose  . bupivacaine (MARCAINE) 0.5 % injection 30 mL  30 mL Other Once Mohammed Kindle, MD      . bupivacaine (PF) (MARCAINE) 0.25 % injection 30 mL  30 mL Other Once Mohammed Kindle, MD      . fentaNYL (SUBLIMAZE) injection 100 mcg  100 mcg Intravenous Once Mohammed Kindle, MD      . lactated ringers infusion 1,000 mL  1,000 mL Intravenous Continuous Mohammed Kindle, MD      . midazolam (VERSED) 5 MG/5ML injection 5 mg  5 mg Intravenous Once Mohammed Kindle, MD      . orphenadrine (NORFLEX) injection 60 mg  60 mg Intramuscular Once Mohammed Kindle, MD      . orphenadrine (NORFLEX) injection 60 mg  60 mg Intramuscular Once Mohammed Kindle, MD      . triamcinolone acetonide (KENALOG-40) injection 40 mg  40 mg Other Once Mohammed Kindle, MD      . triamcinolone acetonide (KENALOG-40) injection 40 mg  40 mg Other Once Mohammed Kindle, MD        Medical Decision Making:  Established Problem, Stable/Improving (1) and Review of Medication Regimen & Side Effects (2)  Bipolar disorder type II-we will continue her Abilify at 30 mg daily. Continue Cymbalta 60 mg in the morning and 30 mg in the evening.  Generalized anxiety Disorder-she states that her anxiety she does not feels under control and thus we'll increase the Cymbalta as noted above. Patient is prescribed Valium 5 mg 1-2 tablets daily as needed   ADHD-patient reports some benefit from the Vyvanse 30 mg daily. I provided her with prescription for this month, February and March.  Insomnia-Ambien 10 mg at bedtime as needed for insomnia. Risk and benefits discussed and patient able to consent.  Patient will follow up in 3 month. He has been going to therapy with her therapist that formally worked in this clinic. She's been encouraged colony questions concerns prior to her next appointment. She is aware of my departure in February but she is aware she be able to continue treatment with another provider  within this clinic.  Treatment Plan Summary:Medication management and Plan Plan   Faith Rogue 02/02/2015, 3:37 PM

## 2015-02-09 ENCOUNTER — Ambulatory Visit: Payer: BLUE CROSS/BLUE SHIELD | Attending: Pain Medicine | Admitting: Pain Medicine

## 2015-02-09 ENCOUNTER — Encounter: Payer: Self-pay | Admitting: Pain Medicine

## 2015-02-09 VITALS — BP 114/62 | HR 85 | Temp 98.1°F | Resp 15 | Ht 66.0 in | Wt 200.0 lb

## 2015-02-09 DIAGNOSIS — M5136 Other intervertebral disc degeneration, lumbar region: Secondary | ICD-10-CM | POA: Diagnosis not present

## 2015-02-09 DIAGNOSIS — Z9889 Other specified postprocedural states: Secondary | ICD-10-CM | POA: Diagnosis not present

## 2015-02-09 DIAGNOSIS — M5137 Other intervertebral disc degeneration, lumbosacral region: Secondary | ICD-10-CM

## 2015-02-09 DIAGNOSIS — M170 Bilateral primary osteoarthritis of knee: Secondary | ICD-10-CM | POA: Diagnosis not present

## 2015-02-09 DIAGNOSIS — M79606 Pain in leg, unspecified: Secondary | ICD-10-CM | POA: Diagnosis present

## 2015-02-09 DIAGNOSIS — M79605 Pain in left leg: Secondary | ICD-10-CM

## 2015-02-09 DIAGNOSIS — M47894 Other spondylosis, thoracic region: Secondary | ICD-10-CM

## 2015-02-09 DIAGNOSIS — M172 Bilateral post-traumatic osteoarthritis of knee: Secondary | ICD-10-CM

## 2015-02-09 DIAGNOSIS — M5481 Occipital neuralgia: Secondary | ICD-10-CM

## 2015-02-09 DIAGNOSIS — M533 Sacrococcygeal disorders, not elsewhere classified: Secondary | ICD-10-CM | POA: Diagnosis not present

## 2015-02-09 DIAGNOSIS — M545 Low back pain: Secondary | ICD-10-CM | POA: Diagnosis present

## 2015-02-09 DIAGNOSIS — M5134 Other intervertebral disc degeneration, thoracic region: Secondary | ICD-10-CM

## 2015-02-09 DIAGNOSIS — M47814 Spondylosis without myelopathy or radiculopathy, thoracic region: Secondary | ICD-10-CM

## 2015-02-09 DIAGNOSIS — M546 Pain in thoracic spine: Secondary | ICD-10-CM | POA: Diagnosis present

## 2015-02-09 DIAGNOSIS — M79604 Pain in right leg: Secondary | ICD-10-CM

## 2015-02-09 DIAGNOSIS — M5459 Other low back pain: Secondary | ICD-10-CM

## 2015-02-09 DIAGNOSIS — G588 Other specified mononeuropathies: Secondary | ICD-10-CM

## 2015-02-09 DIAGNOSIS — M47816 Spondylosis without myelopathy or radiculopathy, lumbar region: Secondary | ICD-10-CM | POA: Diagnosis not present

## 2015-02-09 MED ORDER — TRAMADOL HCL 50 MG PO TABS: ORAL_TABLET | ORAL | Status: DC

## 2015-02-09 MED ORDER — HYDROCODONE-ACETAMINOPHEN 5-325MG PREPACK (~~LOC~~: ORAL_TABLET | ORAL | Status: DC

## 2015-02-09 NOTE — Patient Instructions (Addendum)
PLAN   Continue present medication Norflex hydrocodone acetaminophen diclofenac gel and tramadol.  Geniculate nerve blocks of knees to be performed at time of return appointment   F/U PCP  Plotnikov for evaliation of  BP and general medical  condition  F/U surgical evaluation. Follow-up Dr.Daldorf  and neurosurgical eval as planned Follow-up Dr.Dumonski as needed  F/U neurological evaluation. May consider pending follow-up evaluations  May consider radiofrequency rhizolysis or intraspinal procedures pending response to present treatment and F/U evaluation   Patient to call Pain Management Center should patient have concerns prior to scheduled return appointment.Pain Management Discharge Instructions  General Discharge Instructions :  If you need to reach your doctor call: Monday-Friday 8:00 am - 4:00 pm at [phone] or toll free [phone].  After clinic hours [phone] to have operator reach doctor.  Bring all of your medication bottles to all your appointments in the pain clinic.  To cancel or reschedule your appointment with Pain Management please remember to call 24 hours in advance to avoid a fee.  Refer to the educational materials which you have been given on: General Risks, I had my Procedure. Discharge Instructions, Post Sedation.  Post Procedure Instructions:  The drugs you were given will stay in your system until tomorrow, so for the next 24 hours you should not drive, make any legal decisions or drink any alcoholic beverages.  You may eat anything you prefer, but it is better to start with liquids then soups and crackers, and gradually work up to solid foods.  Please notify your doctor immediately if you have any unusual bleeding, trouble breathing or pain that is not related to your normal pain.  Depending on the type of procedure that was done, some parts of your body may feel week and/or numb.  This usually clears up by tonight or the next day.  Walk  with the use of an assistive device or accompanied by an adult for the 24 hours.  You may use ice on the affected area for the first 24 hours.  Put ice in a Ziploc bag and cover with a towel and place against area 15 minutes on 15 minutes off.  You may switch to heat after 24 hours.Knee Injection A knee injection is a procedure to get medicine into your knee joint. Your health care provider puts a needle into the joint and injects medicine with an attached syringe. The injected medicine may relieve the pain, swelling, and stiffness of arthritis. The injected medicine may also help to lubricate and cushion your knee joint. You may need more than one injection. LET [facility] CARE PROVIDER KNOW ABOUT:  Any allergies you have.  All medicines you are taking, including vitamins, herbs, eye drops, creams, and over-the-counter medicines.  Previous problems you or members of your family have had with the use of anesthetics.  Any blood disorders you have.  Previous surgeries you have had.  Any medical conditions you may have. RISKS AND COMPLICATIONS Generally, this is a safe procedure. However, problems may occur, including:  Infection.  Bleeding.  Worsening symptoms.  Damage to the area around your knee.  Allergic reaction to any of the medicines.  Skin reactions from repeated injections. BEFORE THE PROCEDURE  Ask your health care provider about changing or stopping your regular medicines. This is especially important if you are taking diabetes medicines or blood thinners.  Plan to have someone take you home after the procedure. PROCEDURE  You will sit or lie down in a position for  your knee to be treated.  The skin over your kneecap will be cleaned with a germ-killing solution (antiseptic).  You will be given a medicine that numbs the area (local anesthetic). You may feel some stinging.  After your knee becomes numb, you will have a second injection. This is the medicine. This  needle is carefully placed between your kneecap and your knee. The medicine is injected into the joint space.  At the end of the procedure, the needle will be removed.  A bandage (dressing) may be placed over the injection site. The procedure may vary among health care providers and hospitals. AFTER THE PROCEDURE  You may have to move your knee through its full range of motion. This helps to get all of the medicine into your joint space.  Your blood pressure, heart rate, breathing rate, and blood oxygen level will be monitored often until the medicines you were given have worn off.  You will be watched to make sure that you do not have a reaction to the injected medicine.   This information is not intended to replace advice given to you by your health care provider. Make sure you discuss any questions you have with your health care provider.   Document Released: 04/07/2006 Document Revised: 02/04/2014 Document Reviewed: 11/24/2013 Elsevier Interactive Patient Education 2016 [place]  What are the risk, side effects and possible complications? Generally speaking, most procedures are safe.  However, with any procedure there are risks, side effects, and the possibility of complications.  The risks and complications are dependent upon the sites that are lesioned, or the type of nerve block to be performed.  The closer the procedure is to the spine, the more serious the risks are.  Great care is taken when placing the radio frequency needles, block needles or lesioning probes, but sometimes complications can occur.  Infection: Any time there is an injection through the skin, there is a risk of infection.  This is why sterile conditions are used for these blocks.  There are four possible types of infection.  Localized skin infection.  Central Nervous System Infection-This can be in the form of Meningitis, which can be deadly.  Epidural Infections-This can  be in the form of an epidural abscess, which can cause pressure inside of the spine, causing compression of the spinal cord with subsequent paralysis. This would require an emergency surgery to decompress, and there are no guarantees that the patient would recover from the paralysis.  Discitis-This is an infection of the intervertebral discs.  It occurs in about 1% of discography procedures.  It is difficult to treat and it may lead to surgery.        2. Pain: the needles have to go through skin and soft tissues, will cause soreness.       3. Damage to internal structures:  The nerves to be lesioned may be near blood vessels or    other nerves which can be potentially damaged.       4. Bleeding: Bleeding is more common if the patient is taking blood thinners such as  aspirin, Coumadin, Ticiid, Plavix, etc., or if he/she have some genetic predisposition  such as hemophilia. Bleeding into the spinal canal can cause compression of the spinal  cord with subsequent paralysis.  This would require an emergency surgery to  decompress and there are no guarantees that the patient would recover from the  paralysis.       5. Pneumothorax:  Puncturing of a lung is a possibility, every time a needle is introduced in  the area of the chest or upper back.  Pneumothorax refers to free air around the  collapsed lung(s), inside of the thoracic cavity (chest cavity).  Another two possible  complications related to a similar event would include: Hemothorax and Chylothorax.   These are variations of the Pneumothorax, where instead of air around the collapsed  lung(s), you may have blood or chyle, respectively.       6. Spinal headaches: They may occur with any procedures in the area of the spine.       7. Persistent CSF (Cerebro-Spinal Fluid) leakage: This is a rare problem, but may occur  with prolonged intrathecal or epidural catheters either due to the formation of a fistulous  track or a dural tear.       8. Nerve damage:  By working so close to the spinal cord, there is always a possibility of  nerve damage, which could be as serious as a permanent spinal cord injury with  paralysis.       9. Death:  Although rare, severe deadly allergic reactions known as "Anaphylactic  reaction" can occur to any of the medications used.      10. Worsening of the symptoms:  We can always make thing worse.  What are the chances of something like this happening? Chances of any of this occuring are extremely low.  By statistics, you have more of a chance of getting killed in a motor vehicle accident: while driving to the hospital than any of the above occurring .  Nevertheless, you should be aware that they are possibilities.  In general, it is similar to taking a shower.  Everybody knows that you can slip, hit your head and get killed.  Does that mean that you should not shower again?  Nevertheless always keep in mind that statistics do not mean anything if you happen to be on the wrong side of them.  Even if a procedure has a 1 (one) in a 1,000,000 (million) chance of going wrong, it you happen to be that one..Also, keep in mind that by statistics, you have more of a chance of having something go wrong when taking medications.  Who should not have this procedure? If you are on a blood thinning medication (e.g. Coumadin, Plavix, see list of "Blood Thinners"), or if you have an active infection going on, you should not have the procedure.  If you are taking any blood thinners, please inform your physician.  How should I prepare for this procedure?  Do not eat or drink anything at least six hours prior to the procedure.  Bring a driver with you .  It cannot be a taxi.  Come accompanied by an adult that can drive you back, and that is strong enough to help you if your legs get weak or numb from the local anesthetic.  Take all of your medicines the morning of the procedure with just enough water to swallow them.  If you have diabetes,  make sure that you are scheduled to have your procedure done first thing in the morning, whenever possible.  If you have diabetes, take only half of your insulin dose and notify our nurse that you have done so as soon as you arrive at the clinic.  If you are diabetic, but only take blood sugar pills (oral hypoglycemic), then do not take them on the morning of your procedure.  You  may take them after you have had the procedure.  Do not take aspirin or any aspirin-containing medications, at least eleven (11) days prior to the procedure.  They may prolong bleeding.  Wear loose fitting clothing that may be easy to take off and that you would not mind if it got stained with Betadine or blood.  Do not wear any jewelry or perfume  Remove any nail coloring.  It will interfere with some of our monitoring equipment.  NOTE: Remember that this is not meant to be interpreted as a complete list of all possible complications.  Unforeseen problems may occur.  BLOOD THINNERS The following drugs contain aspirin or other products, which can cause increased bleeding during surgery and should not be taken for 2 weeks prior to and 1 week after surgery.  If you should need take something for relief of minor pain, you may take acetaminophen which is found in Tylenol,m Datril, Anacin-3 and Panadol. It is not blood thinner. The products listed below are.  Do not take any of the products listed below in addition to any listed on your instruction sheet.  A.P.C or A.P.C with Codeine Codeine Phosphate Capsules #3 Ibuprofen Ridaura  ABC compound Congesprin Imuran rimadil  Advil Cope Indocin Robaxisal  Alka-Seltzer Effervescent Pain Reliever and Antacid Coricidin or Coricidin-D  Indomethacin Rufen  Alka-Seltzer plus Cold Medicine Cosprin Ketoprofen S-A-C Tablets  Anacin Analgesic Tablets or Capsules Coumadin Korlgesic Salflex  Anacin Extra Strength Analgesic tablets or capsules CP-2 Tablets Lanoril Salicylate  Anaprox  Cuprimine Capsules Levenox Salocol  Anexsia-D Dalteparin Magan Salsalate  Anodynos Darvon compound Magnesium Salicylate Sine-off  Ansaid Dasin Capsules Magsal Sodium Salicylate  Anturane Depen Capsules Marnal Soma  APF Arthritis pain formula Dewitt's Pills Measurin Stanback  Argesic Dia-Gesic Meclofenamic Sulfinpyrazone  Arthritis Bayer Timed Release Aspirin Diclofenac Meclomen Sulindac  Arthritis pain formula Anacin Dicumarol Medipren Supac  Analgesic (Safety coated) Arthralgen Diffunasal Mefanamic Suprofen  Arthritis Strength Bufferin Dihydrocodeine Mepro Compound Suprol  Arthropan liquid Dopirydamole Methcarbomol with Aspirin Synalgos  ASA tablets/Enseals Disalcid Micrainin Tagament  Ascriptin Doan's Midol Talwin  Ascriptin A/D Dolene Mobidin Tanderil  Ascriptin Extra Strength Dolobid Moblgesic Ticlid  Ascriptin with Codeine Doloprin or Doloprin with Codeine Momentum Tolectin  Asperbuf Duoprin Mono-gesic Trendar  Aspergum Duradyne Motrin or Motrin IB Triminicin  Aspirin plain, buffered or enteric coated Durasal Myochrisine Trigesic  Aspirin Suppositories Easprin Nalfon Trillsate  Aspirin with Codeine Ecotrin Regular or Extra Strength Naprosyn Uracel  Atromid-S Efficin Naproxen Ursinus  Auranofin Capsules Elmiron Neocylate Vanquish  Axotal Emagrin Norgesic Verin  Azathioprine Empirin or Empirin with Codeine Normiflo Vitamin E  Azolid Emprazil Nuprin Voltaren  Bayer Aspirin plain, buffered or children's or timed BC Tablets or powders Encaprin Orgaran Warfarin Sodium  Buff-a-Comp Enoxaparin Orudis Zorpin  Buff-a-Comp with Codeine Equegesic Os-Cal-Gesic   Buffaprin Excedrin plain, buffered or Extra Strength Oxalid   Bufferin Arthritis Strength Feldene Oxphenbutazone   Bufferin plain or Extra Strength Feldene Capsules Oxycodone with Aspirin   Bufferin with Codeine Fenoprofen Fenoprofen Pabalate or Pabalate-SF   Buffets II Flogesic Panagesic   Buffinol plain or Extra Strength Florinal  or Florinal with Codeine Panwarfarin   Buf-Tabs Flurbiprofen Penicillamine   Butalbital Compound Four-way cold tablets Penicillin   Butazolidin Fragmin Pepto-Bismol   Carbenicillin Geminisyn Percodan   Carna Arthritis Reliever Geopen Persantine   Carprofen Gold's salt Persistin   Chloramphenicol Goody's Phenylbutazone   Chloromycetin Haltrain Piroxlcam   Clmetidine heparin Plaquenil   Cllnoril Hyco-pap Ponstel   Clofibrate Hydroxy chloroquine Propoxyphen  Before stopping any of these medications, be sure to consult the physician who ordered them.  Some, such as Coumadin (Warfarin) are ordered to prevent or treat serious conditions such as "deep thrombosis", "pumonary embolisms", and other heart problems.  The amount of time that you may need off of the medication may also vary with the medication and the reason for which you were taking it.  If you are taking any of these medications, please make sure you notify your pain physician before you undergo any procedures.

## 2015-02-09 NOTE — Progress Notes (Signed)
   Subjective:    Patient ID: [name], female    DOB: October 22, 1970, 45 y.o.   MRN: [id number]  HPI  The patient is a 45 year old female who returns to pain management for evaluation and treatment of pain involving the upper mid lower back and lower extremity regions. The patient is undergone surgical evaluation by Dr.Durmonski is without plans for surgical intervention. We discussed patient's condition on today's visit and patient admits to severe pain involving the knees. We will proceed with geniculate nerve blocks of the knee at time return appointment. The patient will continue present medications consisting of tramadol hydrocodone acetaminophen and Norflex and Voltaren gel. The patient was with understanding and agreement with suggested treatment plan. The patient states that she has to lift her legs with her hands at times due to the pain involving the knees and that any movement in the region of the knees call to severely disabling pain. At the present time patient without plans for additional surgery of the knees. The patient is status post prior surgery of knees with return of severe pain at this time. We've also advised patient to follow up with surgeon for further assessment of her pain of the knees as well. The patient agreed to suggested treatment plan     Review of Systems     Objective:   Physical Exam  There was tenderness to palpation of paraspinal musculature and cervical region cervical facet region palpation which reproduces pain of mild-to-moderate degree. There was moderate tenderness to palpation over the thoracic paraspinal musculature region with significant muscle spasms noted in the mid and lower thoracic paraspinal musculature region. No crepitus of the thoracic region was noted. There was tenderness of the acromioclavicular and glenohumeral joint regions and patient appeared to be with unremarkable Spurling's maneuver. Tinel and Phalen's maneuver were without  increased pain of significant degree. Palpation over the lumbar paraspinal muscles lumbar facet region was with moderate to moderately severe tenderness to palpation. There was tenderness over the PSIS and PII S region a moderate degree as well palpation over the gluteal and piriformis musculature region reproduced moderate discomfort. There was mild tenderness of the greater trochanteric region and iliotibial band region. Straight leg raise was tolerates appointment 30 without increased pain with dorsiflexion noted. The knees were with well-healed scars of the knees without increased warmth and erythema in the region of the knees there was negative anterior and posterior drawer signs noted significant crepitus of the knees noted no increased warmth and erythema in the region of the knees noted. EHL strength appeared to be decreased. No definite sensory deficit or dermatomal distribution detected. Negative clonus negative Homans. Abdomen nontender and no costovertebral tenderness noted.      Assessment & Plan:     Thoracic facet syndrome  Degenerative joint disease of knees  Degenerative disc disease lumbar spine Degenerative changes L3-4, L4-5, and L5-S1, degenerative changes  Lumbar facet syndrome  Sacroiliac joint dysfunction

## 2015-02-09 NOTE — Progress Notes (Signed)
Safety precautions to be maintained throughout the outpatient stay will include: orient to surroundings, keep bed in low position, maintain call bell within reach at all times, provide assistance with transfer out of bed and ambulation.  

## 2015-02-09 NOTE — Progress Notes (Signed)
   Subjective:    Patient ID: [name], female    DOB: September 12, 1970, 45 y.o.   MRN: [id number]  HPI    Review of Systems     Objective:   Physical Exam        Assessment & Plan:

## 2015-02-09 NOTE — Progress Notes (Signed)
   Subjective:    Patient ID: [name], female    DOB: October 14, 1970, 45 y.o.   MRN: [id number]  HPI    Review of Systems     Objective:   Physical Exam        Assessment & Plan:  PLAN   Continue present medication Norflex hydrocodone acetaminophen diclofenac gel and tramadol.  Geniculate nerve blocks of knees to be performed at time of return appointment   F/U PCP  Plotnikov for evaliation of  BP and general medical  condition  F/U surgical evaluation. Follow-up Dr.Daldorf  and neurosurgical eval as planned Follow-up Dr.Dumonski as needed  F/U neurological evaluation. May consider pending follow-up evaluations  May consider radiofrequency rhizolysis or intraspinal procedures pending response to present treatment and F/U evaluation   Patient to call Pain Management Center should patient have concerns .

## 2015-02-15 ENCOUNTER — Ambulatory Visit: Payer: BLUE CROSS/BLUE SHIELD | Attending: Pain Medicine | Admitting: Pain Medicine

## 2015-02-15 ENCOUNTER — Encounter: Payer: Self-pay | Admitting: Pain Medicine

## 2015-02-15 VITALS — BP 119/73 | HR 74 | Temp 98.4°F | Resp 16 | Ht 66.0 in | Wt 200.0 lb

## 2015-02-15 DIAGNOSIS — M5459 Other low back pain: Secondary | ICD-10-CM

## 2015-02-15 DIAGNOSIS — M170 Bilateral primary osteoarthritis of knee: Secondary | ICD-10-CM | POA: Diagnosis not present

## 2015-02-15 DIAGNOSIS — M5136 Other intervertebral disc degeneration, lumbar region: Secondary | ICD-10-CM

## 2015-02-15 DIAGNOSIS — M47894 Other spondylosis, thoracic region: Secondary | ICD-10-CM

## 2015-02-15 DIAGNOSIS — M79604 Pain in right leg: Secondary | ICD-10-CM

## 2015-02-15 DIAGNOSIS — M5134 Other intervertebral disc degeneration, thoracic region: Secondary | ICD-10-CM

## 2015-02-15 DIAGNOSIS — M79605 Pain in left leg: Secondary | ICD-10-CM

## 2015-02-15 DIAGNOSIS — M545 Low back pain: Secondary | ICD-10-CM | POA: Diagnosis not present

## 2015-02-15 DIAGNOSIS — M5137 Other intervertebral disc degeneration, lumbosacral region: Secondary | ICD-10-CM

## 2015-02-15 DIAGNOSIS — M47814 Spondylosis without myelopathy or radiculopathy, thoracic region: Secondary | ICD-10-CM

## 2015-02-15 DIAGNOSIS — M79606 Pain in leg, unspecified: Secondary | ICD-10-CM | POA: Diagnosis present

## 2015-02-15 DIAGNOSIS — Z7982 Long term (current) use of aspirin: Secondary | ICD-10-CM

## 2015-02-15 DIAGNOSIS — G588 Other specified mononeuropathies: Secondary | ICD-10-CM

## 2015-02-15 DIAGNOSIS — M5481 Occipital neuralgia: Secondary | ICD-10-CM

## 2015-02-15 DIAGNOSIS — M172 Bilateral post-traumatic osteoarthritis of knee: Secondary | ICD-10-CM

## 2015-02-15 DIAGNOSIS — M47816 Spondylosis without myelopathy or radiculopathy, lumbar region: Secondary | ICD-10-CM

## 2015-02-15 MED ORDER — FENTANYL CITRATE (PF) 100 MCG/2ML IJ SOLN
INTRAMUSCULAR | Status: AC
  Administered 2015-02-15: 100 ug via INTRAVENOUS
  Filled 2015-02-15: qty 2

## 2015-02-15 MED ORDER — FENTANYL CITRATE (PF) 100 MCG/2ML IJ SOLN: 100.0000 ug | Freq: Once | INTRAMUSCULAR | Status: DC

## 2015-02-15 MED ORDER — MIDAZOLAM HCL 5 MG/5ML IJ SOLN
INTRAMUSCULAR | Status: AC
  Administered 2015-02-15: 5 mg
  Filled 2015-02-15: qty 5

## 2015-02-15 MED ORDER — BUPIVACAINE HCL (PF) 0.25 % IJ SOLN
INTRAMUSCULAR | Status: AC
  Administered 2015-02-15: 13:00:00
  Filled 2015-02-15: qty 30

## 2015-02-15 MED ORDER — AZITHROMYCIN 250 MG PO TABS: ORAL_TABLET | ORAL | Status: DC

## 2015-02-15 MED ORDER — BUPIVACAINE HCL (PF) 0.25 % IJ SOLN: 30.0000 mL | Freq: Once | INTRAMUSCULAR | Status: DC

## 2015-02-15 MED ORDER — LIDOCAINE HCL (PF) 1 % IJ SOLN: 10.0000 mL | Freq: Once | INTRAMUSCULAR | Status: DC

## 2015-02-15 MED ORDER — TRIAMCINOLONE ACETONIDE 40 MG/ML IJ SUSP
INTRAMUSCULAR | Status: AC
  Administered 2015-02-15: 13:00:00
  Filled 2015-02-15: qty 2

## 2015-02-15 MED ORDER — LIDOCAINE HCL (PF) 1 % IJ SOLN
INTRAMUSCULAR | Status: DC
Start: 2015-02-15 — End: 2015-02-15
  Filled 2015-02-15: qty 10

## 2015-02-15 MED ORDER — TRIAMCINOLONE ACETONIDE 40 MG/ML IJ SUSP: 40.0000 mg | Freq: Once | INTRAMUSCULAR | Status: DC

## 2015-02-15 MED ORDER — ORPHENADRINE CITRATE 30 MG/ML IJ SOLN
INTRAMUSCULAR | Status: DC
Start: 2015-02-15 — End: 2015-02-15
  Filled 2015-02-15: qty 4

## 2015-02-15 MED ORDER — LACTATED RINGERS IV SOLN: 1000.0000 mL | INTRAVENOUS | Status: DC

## 2015-02-15 NOTE — Progress Notes (Signed)
Subjective:    Patient ID: [name], female    DOB: 21-Mar-1970, 45 y.o.   MRN: [id number]  HPI    Geniculate nerve blocks of the left and right knees   The patient is a 45 y.o. female who returns to the Pain Management Center for further evaluation and treatment of pain involving the lumbar lower extremity region with severe pain of the left and right knees. Prior studies reveal patient to be with significant degenerative joint disease of the knees.  We will proceed with geniculate nerve blocks of the left and right knees in an attempt to decrease severity of symptoms, minimize the risk of medication escalation, hopefully retard progression of symptoms and avoid the need for more involved treatment.  The risks benefits and expectations of the procedure were discussed with the patient. The patient was with understanding and in agreement with suggested treatment plan.  DESCRIPTION OF PROCEDURE: Geniculate nerve blocks of the left knee. The  procedure was performed with IV Versed and IV fentanyl, conscious sedation and under fluoroscopic guidance.  NEEDLE PLACEMENT FOR BLOCK OF THE LATERAL SUPERIOR GENICULATE NERVE: The patient was taking to the fluoroscopy suite. With the patient supine, with knee in flexed position, Betadine prep of proposed entry site accomplished.  IV Versed, IV fentanyl conscious sedation, EKG, blood pressure, pulse and pulse oximetry monitoring were all in place. Under fluoroscopic guidance, a 22-gauge needle was inserted in the region of the left knee with needle placed at the lateral border of the femur at the junction of the shaft of the femur and the condyle of the femur.  Following needle placement at the lateral aspect of the knee, needle placement was then accomplished in the region of the medial aspect of the knee.  NEEDLE PLACEMENT FOR BLOCK OF THE MEDIAL SUPERIOR GENICULATE NERVE:  Under fluoroscopic guidance, a 22 - gauge needle was inserted in the region of  the left knee with needle placed at the medial border of the femur at the junction of the shaft of the femur and the condyle of the femur.   NEEDLE PLACEMENT FOR BLOCK OF THE MEDIAL INFERIOR GENICULATE NERVE:  Under fluoroscopic guidance, a 22 - gauge needle was inserted in the region of the left knee with needle placed at the junction of the shaft and plateau of the tibia.   Following needle placement on AP view of needles placed in all three locations, placement was then verified on lateral view with the tips of the superior lateral and superior medial needles documented to be one half the distance of the shaft of the femur and the tip of the inferior medial geniculate needle documented to be one half the distance of the shaft of the tibia.  Following documentation of needle placements on lateral view, each needle was injected with one mL of 0.25% bupivacaine with Kenalog.. The patient tolerated the procedure well.   GENICULATE NERVE BLOCKS OF THE RIGHT KNEE The procedure was performed on the right knee exactly was performed on the left knee utilizing the same techniques under fluoroscopic guidance and same medications  The patient tolerated the procedure well   A total of 20 mg of Kenalog was utilized for the entire procedure  PLAN 1. Medications: Continue present medications tramadol and hydrocodone acetaminophen and Norflex for pterin gel 2. Follow-up appointment with PCP Dr.Plotnikov  for evaluation of blood pressure and general medical condition. 3. Follow-up surgical evaluationThe patient will undergo surgical follow-up evaluation as discussed  4. Follow-up  neurological evaluation. Has been addressed  5. He patient may be a candidate for radiofrequency rhizolysis and other treatment pending response to treatment on today's visit and follow-up evaluation. 6. The patient is advised to adhere to proper body mechanics and avoid activities which appear to aggravate condition   Review of  Systems     Objective:   Physical Exam        Assessment & Plan:

## 2015-02-15 NOTE — Progress Notes (Signed)
Safety precautions to be maintained throughout the outpatient stay will include: orient to surroundings, keep bed in low position, maintain call bell within reach at all times, provide assistance with transfer out of bed and ambulation.  

## 2015-02-15 NOTE — Patient Instructions (Addendum)
PLAN   Continue present medication Norflex diclofenac gel, tramadol and hydrocodone acetaminophen. Begin taking Z-Pak(azithromycin) antibiotic today as as discussed  F/U PCP  Plotnikov for evaliation of  BP and general medical  condition  F/U surgical evaluation. Follow-up Dr.Daldorf  and neurosurgical eval as planned   F/U neurological evaluation. May consider pending follow-up evaluations  May consider radiofrequency rhizolysis or intraspinal procedures pending response to present treatment and F/U evaluation   Patient to call [facility] should patient have concerns prior to scheduled return appointment.Selective Nerve Root Block Patient Information  Description: Specific nerve roots exit the spinal canal and these nerves can be compressed and inflamed by a bulging disc and bone spurs.  By injecting steroids on the nerve root, we can potentially decrease the inflammation surrounding these nerves, which often leads to decreased pain.  Also, by injecting local anesthesia on the nerve root, this can provide [place] helpful information to give to your referring doctor if it decreases your pain.  Selective nerve root blocks can be done along the spine from the neck to the low back depending on the location of your pain.   After numbing the skin with local anesthesia, a small needle is passed to the nerve root and the position of the needle is verified using x-ray pictures.  After the needle is in correct position, we then deposit the medication.  You may experience a pressure sensation while this is being done.  The entire block usually lasts less than 15 minutes.  Conditions that may be treated with selective nerve root blocks:  Low back and leg pain  Spinal stenosis  Diagnostic block prior to potential surgery  Neck and arm pain  Post laminectomy syndrome  Preparation for the injection:  1. Do not eat any solid food or dairy products within 6 hours of your appointment. 2. You  may drink clear liquids up to 2 hours before an appointment.  Clear liquids include water, black coffee, juice or soda.  No milk or cream please. 3. You may take your regular medications, including pain medications, with a sip of water before your appointment.  Diabetics should hold regular insulin (if taken separately) and take 1/2 normal NPH dose the morning of the procedure.  Carry some sugar containing items with you to your appointment. 4. A driver must accompany you and be prepared to drive you home after your procedure. 5. Bring all your current medications with you. 6. An IV may be inserted and sedation may be given at the discretion of the physician. 7. A blood pressure cuff, EKG, and other monitors will often be applied during the procedure.  Some patients may need to have extra oxygen administered for a short period. 8. You will be asked to provide medical information, including allergies, prior to the procedure.  We must know immediately if you are taking blood  Thinners (like Coumadin) or if you are allergic to IV iodine contrast (dye).  Possible side-effects: All are usually temporary  Bleeding from needle site  Light headedness  Numbness and tingling  Decreased blood pressure  Weakness in arms/legs  Pressure sensation in back/neck  Pain at injection site (several days)  Possible complications: All are extremely rare  Infection  Nerve injury  Spinal headache (a headache wore with upright position)  Call if you experience:  Fever/chills associated with headache or increased back/neck pain  Headache worsened by an upright position  New onset weakness or numbness of an extremity below the injection site  Hives or difficulty breathing (go to the emergency room)  Inflammation or drainage at the injection site(s)  Severe back/neck pain greater than usual  New symptoms which are concerning to you  Please note:  Although the local anesthetic injected can  often make your back or neck feel good for several hours after the injection the pain will likely return.  It takes 3-5 days for steroids to work on the nerve root. You may not notice any pain relief for at least one week.  If effective, we will often do a series of 3 injections spaced 3-6 weeks apart to maximally decrease your pain.    If you have any questions, please call [phone] [facility] Dis[facility] Inst[facility]  General Discharge Instructions :  If you need to reach your doctor call: Monday-Friday 8:00 am - 4:00 pm at [phone] or toll free [phone].  After clinic hours [phone] to have operator reach doctor.  Bring all of your medication bottles to all your appointments in the pain clinic.  To cancel or reschedule your appointment with Pain Management please remember to call 24 hours in advance to avoid a fee.  Refer to the educational materials which you have been given on: General Risks, I had my Procedure. Discharge Instructions, Post Sedation.  Post Procedure Instructions:  The drugs you were given will stay in your system until tomorrow, so for the next 24 hours you should not drive, make any legal decisions or drink any alcoholic beverages.  You may eat anything you prefer, but it is better to start with liquids then soups and crackers, and gradually work up to solid foods.  Please notify your doctor immediately if you have any unusual bleeding, trouble breathing or pain that is not related to your normal pain.  Depending on the type of procedure that was done, some parts of your body may feel week and/or numb.  This usually clears up by tonight or the next day.  Walk with the use of an assistive device or accompanied by an adult for the 24 hours.  You may use ice on the affected area for the first 24 hours.  Put ice in a Ziploc bag and cover with a towel and place against area 15 minutes on 15 minutes off.   You may switch to heat after 24 hours.

## 2015-02-16 ENCOUNTER — Telehealth: Payer: Self-pay | Admitting: *Deleted

## 2015-02-16 NOTE — Telephone Encounter (Signed)
Spoke with patient re; procedure on yesterday.  Verbalizes no questions or concerns.  

## 2015-02-21 ENCOUNTER — Other Ambulatory Visit: Payer: Self-pay | Admitting: Pain Medicine

## 2015-03-06 ENCOUNTER — Other Ambulatory Visit: Payer: Self-pay | Admitting: Psychiatry

## 2015-03-07 ENCOUNTER — Ambulatory Visit: Payer: BLUE CROSS/BLUE SHIELD | Attending: Pain Medicine | Admitting: Pain Medicine

## 2015-03-07 DIAGNOSIS — M172 Bilateral post-traumatic osteoarthritis of knee: Secondary | ICD-10-CM

## 2015-03-07 DIAGNOSIS — M545 Low back pain: Secondary | ICD-10-CM | POA: Diagnosis present

## 2015-03-07 DIAGNOSIS — G588 Other specified mononeuropathies: Secondary | ICD-10-CM

## 2015-03-07 DIAGNOSIS — M47816 Spondylosis without myelopathy or radiculopathy, lumbar region: Secondary | ICD-10-CM

## 2015-03-07 DIAGNOSIS — M533 Sacrococcygeal disorders, not elsewhere classified: Secondary | ICD-10-CM | POA: Diagnosis not present

## 2015-03-07 DIAGNOSIS — M5136 Other intervertebral disc degeneration, lumbar region: Secondary | ICD-10-CM | POA: Diagnosis not present

## 2015-03-07 DIAGNOSIS — M170 Bilateral primary osteoarthritis of knee: Secondary | ICD-10-CM | POA: Diagnosis not present

## 2015-03-07 DIAGNOSIS — M79604 Pain in right leg: Secondary | ICD-10-CM

## 2015-03-07 DIAGNOSIS — M5134 Other intervertebral disc degeneration, thoracic region: Secondary | ICD-10-CM

## 2015-03-07 DIAGNOSIS — M47894 Other spondylosis, thoracic region: Secondary | ICD-10-CM

## 2015-03-07 DIAGNOSIS — M79605 Pain in left leg: Secondary | ICD-10-CM

## 2015-03-07 DIAGNOSIS — M62830 Muscle spasm of back: Secondary | ICD-10-CM | POA: Diagnosis not present

## 2015-03-07 DIAGNOSIS — M5481 Occipital neuralgia: Secondary | ICD-10-CM

## 2015-03-07 DIAGNOSIS — M546 Pain in thoracic spine: Secondary | ICD-10-CM | POA: Diagnosis present

## 2015-03-07 DIAGNOSIS — M5459 Other low back pain: Secondary | ICD-10-CM

## 2015-03-07 DIAGNOSIS — M47814 Spondylosis without myelopathy or radiculopathy, thoracic region: Secondary | ICD-10-CM

## 2015-03-07 DIAGNOSIS — M5137 Other intervertebral disc degeneration, lumbosacral region: Secondary | ICD-10-CM

## 2015-03-07 MED ORDER — DICLOFENAC SODIUM 1 % TD GEL: TRANSDERMAL | Status: DC

## 2015-03-07 MED ORDER — ORPHENADRINE CITRATE ER 100 MG PO TB12: ORAL_TABLET | ORAL | Status: DC

## 2015-03-07 MED ORDER — HYDROCODONE-ACETAMINOPHEN 5-325MG PREPACK (~~LOC~~: ORAL_TABLET | ORAL | Status: DC

## 2015-03-07 MED ORDER — TRAMADOL HCL 50 MG PO TABS: ORAL_TABLET | ORAL | Status: DC

## 2015-03-07 NOTE — Patient Instructions (Addendum)
PLAN   Continue present medication Norflex hydrocodone acetaminophen diclofenac gel and tramadol.  Lumbar facet, medial branch nerve, blocks to be performed at time return appointment  F/U PCP  Plotnikov for evaliation of  BP and general medical  condition  F/U surgical evaluation. Follow-up Dr.Daldorf  and neurosurgical eval as planned Follow-up Dr.Dumonski as needed  F/U neurological evaluation. May consider pending follow-up evaluations  May consider radiofrequency rhizolysis or intraspinal procedures pending response to present treatment and F/U evaluation   Patient to call [facility] should patient have concerns prior to scheduled return appointment.Pain Management Discharge Instructions  General Discharge Instructions :  If you need to reach your doctor call: Monday-Friday 8:00 am - 4:00 pm at [phone] or toll free [phone].  After clinic hours [phone] to have operator reach doctor.  Bring all of your medication bottles to all your appointments in the pain clinic.  To cancel or reschedule your appointment with Pain Management please remember to call 24 hours in advance to avoid a fee.  Refer to the educational materials which you have been given on: General Risks, I had my Procedure. Discharge Instructions, Post Sedation.  Post Procedure Instructions:  The drugs you were given will stay in your system until tomorrow, so for the next 24 hours you should not drive, make any legal decisions or drink any alcoholic beverages.  You may eat anything you prefer, but it is better to start with liquids then soups and crackers, and gradually work up to solid foods.  Please notify your doctor immediately if you have any unusual bleeding, trouble breathing or pain that is not related to your normal pain.  Depending on the type of procedure that was done, some parts of your body may feel week and/or numb.  This usually clears up by tonight or the next  day.  Walk with the use of an assistive device or accompanied by an adult for the 24 hours.  You may use ice on the affected area for the first 24 hours.  Put ice in a Ziploc bag and cover with a towel and place against area 15 minutes on 15 minutes off.  You may switch to heat after 24 hours.GENERAL RISKS AND COMPLICATIONS  What are the risk, side effects and possible complications? Generally speaking, most procedures are safe.  However, with any procedure there are risks, side effects, and the possibility of complications.  The risks and complications are dependent upon the sites that are lesioned, or the type of nerve block to be performed.  The closer the procedure is to the spine, the more serious the risks are.  Great care is taken when placing the radio frequency needles, block needles or lesioning probes, but sometimes complications can occur. 1. Infection: Any time there is an injection through the skin, there is a risk of infection.  This is why sterile conditions are used for these blocks.  There are four possible types of infection. 1. Localized skin infection. 2. Central Nervous System Infection-This can be in the form of Meningitis, which can be deadly. 3. Epidural Infections-This can be in the form of an epidural abscess, which can cause pressure inside of the spine, causing compression of the spinal cord with subsequent paralysis. This would require an emergency surgery to decompress, and there are no guarantees that the patient would recover from the paralysis. 4. Discitis-This is an infection of the intervertebral discs.  It occurs in about 1% of discography procedures.  It is difficult to  treat and it may lead to surgery.        2. Pain: the needles have to go through skin and soft tissues, will cause soreness.       3. Damage to internal structures:  The nerves to be lesioned may be near blood vessels or    other nerves which can be potentially damaged.       4. Bleeding:  Bleeding is more common if the patient is taking blood thinners such as  aspirin, Coumadin, Ticiid, Plavix, etc., or if he/she have some genetic predisposition  such as hemophilia. Bleeding into the spinal canal can cause compression of the spinal  cord with subsequent paralysis.  This would require an emergency surgery to  decompress and there are no guarantees that the patient would recover from the  paralysis.       5. Pneumothorax:  Puncturing of a lung is a possibility, every time a needle is introduced in  the area of the chest or upper back.  Pneumothorax refers to free air around the  collapsed lung(s), inside of the thoracic cavity (chest cavity).  Another two possible  complications related to a similar event would include: Hemothorax and Chylothorax.   These are variations of the Pneumothorax, where instead of air around the collapsed  lung(s), you may have blood or chyle, respectively.       6. Spinal headaches: They may occur with any procedures in the area of the spine.       7. Persistent CSF (Cerebro-Spinal Fluid) leakage: This is a rare problem, but may occur  with prolonged intrathecal or epidural catheters either due to the formation of a fistulous  track or a dural tear.       8. Nerve damage: By working so close to the spinal cord, there is always a possibility of  nerve damage, which could be as serious as a permanent spinal cord injury with  paralysis.       9. Death:  Although rare, severe deadly allergic reactions known as "Anaphylactic  reaction" can occur to any of the medications used.      10. Worsening of the symptoms:  We can always make thing worse.  What are the chances of something like this happening? Chances of any of this occuring are extremely low.  By statistics, you have more of a chance of getting killed in a motor vehicle accident: while driving to the hospital than any of the above occurring .  Nevertheless, you should be aware that they are possibilities.  In  general, it is similar to taking a shower.  Everybody knows that you can slip, hit your head and get killed.  Does that mean that you should not shower again?  Nevertheless always keep in mind that statistics do not mean anything if you happen to be on the wrong side of them.  Even if a procedure has a 1 (one) in a 1,000,000 (million) chance of going wrong, it you happen to be that one..Also, keep in mind that by statistics, you have more of a chance of having something go wrong when taking medications.  Who should not have this procedure? If you are on a blood thinning medication (e.g. Coumadin, Plavix, see list of "Blood Thinners"), or if you have an active infection going on, you should not have the procedure.  If you are taking any blood thinners, please inform your physician.  How should I prepare for this procedure?  Do not eat or  drink anything at least six hours prior to the procedure.  Bring a driver with you .  It cannot be a taxi.  Come accompanied by an adult that can drive you back, and that is strong enough to help you if your legs get weak or numb from the local anesthetic.  Take all of your medicines the morning of the procedure with just enough water to swallow them.  If you have diabetes, make sure that you are scheduled to have your procedure done first thing in the morning, whenever possible.  If you have diabetes, take only half of your insulin dose and notify our nurse that you have done so as soon as you arrive at the clinic.  If you are diabetic, but only take blood sugar pills (oral hypoglycemic), then do not take them on the morning of your procedure.  You may take them after you have had the procedure.  Do not take aspirin or any aspirin-containing medications, at least eleven (11) days prior to the procedure.  They may prolong bleeding.  Wear loose fitting clothing that may be easy to take off and that you would not mind if it got stained with Betadine or  blood.  Do not wear any jewelry or perfume  Remove any nail coloring.  It will interfere with some of our monitoring equipment.  NOTE: Remember that this is not meant to be interpreted as a complete list of all possible complications.  Unforeseen problems may occur.  BLOOD THINNERS The following drugs contain aspirin or other products, which can cause increased bleeding during surgery and should not be taken for 2 weeks prior to and 1 week after surgery.  If you should need take something for relief of minor pain, you may take acetaminophen which is found in Tylenol,m Datril, Anacin-3 and Panadol. It is not blood thinner. The products listed below are.  Do not take any of the products listed below in addition to any listed on your instruction sheet.  A.P.C or A.P.C with Codeine Codeine Phosphate Capsules #3 Ibuprofen Ridaura  ABC compound Congesprin Imuran rimadil  Advil Cope Indocin Robaxisal  Alka-Seltzer Effervescent Pain Reliever and Antacid Coricidin or Coricidin-D  Indomethacin Rufen  Alka-Seltzer plus Cold Medicine Cosprin Ketoprofen S-A-C Tablets  Anacin Analgesic Tablets or Capsules Coumadin Korlgesic Salflex  Anacin Extra Strength Analgesic tablets or capsules CP-2 Tablets Lanoril Salicylate  Anaprox Cuprimine Capsules Levenox Salocol  Anexsia-D Dalteparin Magan Salsalate  Anodynos Darvon compound Magnesium Salicylate Sine-off  Ansaid Dasin Capsules Magsal Sodium Salicylate  Anturane Depen Capsules Marnal Soma  APF Arthritis pain formula Dewitt's Pills Measurin Stanback  Argesic Dia-Gesic Meclofenamic Sulfinpyrazone  Arthritis Bayer Timed Release Aspirin Diclofenac Meclomen Sulindac  Arthritis pain formula Anacin Dicumarol Medipren Supac  Analgesic (Safety coated) Arthralgen Diffunasal Mefanamic Suprofen  Arthritis Strength Bufferin Dihydrocodeine Mepro Compound Suprol  Arthropan liquid Dopirydamole Methcarbomol with Aspirin Synalgos  ASA tablets/Enseals Disalcid Micrainin  Tagament  Ascriptin Doan's Midol Talwin  Ascriptin A/D Dolene Mobidin Tanderil  Ascriptin Extra Strength Dolobid Moblgesic Ticlid  Ascriptin with Codeine Doloprin or Doloprin with Codeine Momentum Tolectin  Asperbuf Duoprin Mono-gesic Trendar  Aspergum Duradyne Motrin or Motrin IB Triminicin  Aspirin plain, buffered or enteric coated Durasal Myochrisine Trigesic  Aspirin Suppositories Easprin Nalfon Trillsate  Aspirin with Codeine Ecotrin Regular or Extra Strength Naprosyn Uracel  Atromid-S Efficin Naproxen Ursinus  Auranofin Capsules Elmiron Neocylate Vanquish  Axotal Emagrin Norgesic Verin  Azathioprine Empirin or Empirin with Codeine Normiflo Vitamin E  Azolid Emprazil Nuprin  Voltaren  Bayer Aspirin plain, buffered or children's or timed BC Tablets or powders Encaprin Orgaran Warfarin Sodium  Buff-a-Comp Enoxaparin Orudis Zorpin  Buff-a-Comp with Codeine Equegesic Os-Cal-Gesic   Buffaprin Excedrin plain, buffered or Extra Strength Oxalid   Bufferin Arthritis Strength Feldene Oxphenbutazone   Bufferin plain or Extra Strength Feldene Capsules Oxycodone with Aspirin   Bufferin with Codeine Fenoprofen Fenoprofen Pabalate or Pabalate-SF   Buffets II Flogesic Panagesic   Buffinol plain or Extra Strength Florinal or Florinal with Codeine Panwarfarin   Buf-Tabs Flurbiprofen Penicillamine   Butalbital Compound Four-way cold tablets Penicillin   Butazolidin Fragmin Pepto-Bismol   Carbenicillin Geminisyn Percodan   Carna Arthritis Reliever Geopen Persantine   Carprofen Gold's salt Persistin   Chloramphenicol Goody's Phenylbutazone   Chloromycetin Haltrain Piroxlcam   Clmetidine heparin Plaquenil   Cllnoril Hyco-pap Ponstel   Clofibrate Hydroxy chloroquine Propoxyphen         Before stopping any of these medications, be sure to consult the physician who ordered them.  Some, such as Coumadin (Warfarin) are ordered to prevent or treat serious conditions such as "deep thrombosis", "pumonary  embolisms", and other heart problems.  The amount of time that you may need off of the medication may also vary with the medication and the reason for which you were taking it.  If you are taking any of these medications, please make sure you notify your pain physician before you undergo any procedures.         Facet Joint Block, Care After Refer to this sheet in the next few weeks. These instructions provide you with information on caring for yourself after your procedure. Your health care provider may also give you more specific instructions. Your treatment has been planned according to current medical practices, but problems sometimes occur. Call your health care provider if you have any problems or questions after your procedure. HOME CARE INSTRUCTIONS  2. Keep track of the amount of pain relief you feel and how long it lasts. 3. Limit pain medicine within the first 4-6 hours after the procedure as directed by your health care provider. 4. Resume taking dietary supplements and medicines as directed by your health care provider. 5. You may resume your regular diet. 6. Do not apply heat near or over the injection site(s) for 24 hours.  7. Do not take a bath or soak in water (such as a pool or lake) for 24 hours. 8. Do not drive for 24 hours unless approved by your health care provider. 9. Avoid strenuous activity for 24 hours. 10. Remove your bandages the morning after the procedure.  11. If the injection site is tender, applying an ice pack may relieve some tenderness. To do this: 1. Put ice in a bag. 2. Place a towel between your skin and the bag. 3. Leave the ice on for 15-20 minutes, 3-4 times a day. 12. Keep follow-up appointments as directed by your health care provider. SEEK MEDICAL CARE IF:   Your pain is not controlled by your medicines.   There is drainage from the injection site.   There is significant bleeding or swelling at the injection site.  You have diabetes  and your blood sugar is above 180 mg/dL. SEEK IMMEDIATE MEDICAL CARE IF:   You develop a fever of 101F (38.3C) or greater.   You have worsening pain or swelling around the injection site.   You have red streaking around the injection site.   You develop severe pain that is not controlled  by your medicines.   You develop a headache, stiff neck, nausea, or vomiting.   Your eyes become very sensitive to light.   You have weakness, paralysis, or tingling in your arms or legs that was not present before the procedure.   You develop difficulty urinating or breathing.    This information is not intended to replace advice given to you by your health care provider. Make sure you discuss any questions you have with your health care provider.   Document Released: 01/01/2012 Document Revised: 02/04/2014 Document Reviewed: 01/01/2012 Elsevier Interactive Patient Education 2016 [employer] Facet Joint Block The facet joints connect the bones of the spine (vertebrae). They make it possible for you to bend, twist, and make other movements with your spine. They also prevent you from overbending, overtwisting, and making other excessive movements.  A facet joint block is a procedure where a numbing medicine (anesthetic) is injected into a facet joint. Often, a type of anti-inflammatory medicine called a steroid is also injected. A facet joint block may be done for two reasons:  13. Diagnosis. A facet joint block may be done as a test to see whether neck or back pain is caused by a worn-down or infected facet joint. If the pain gets better after a facet joint block, it means the pain is probably coming from the facet joint. If the pain does not get better, it means the pain is probably not coming from the facet joint.  14. Therapy. A facet joint block may be done to relieve neck or back pain caused by a facet joint. A facet joint block is only done as a therapy if the pain does not improve with  medicine, exercise programs, physical therapy, and other forms of pain management. LET [facility] CARE PROVIDER KNOW ABOUT:   Any allergies you have.   All medicines you are taking, including vitamins, herbs, eyedrops, and over-the-counter medicines and creams.   Previous problems you or members of your family have had with the use of anesthetics.   Any blood disorders you have had.   Other health problems you have. RISKS AND COMPLICATIONS Generally, having a facet joint block is safe. However, as with any procedure, complications can occur. Possible complications associated with having a facet joint block include:   Bleeding.   Injury to a nerve near the injection site.   Pain at the injection site.   Weakness or numbness in areas controlled by nerves near the injection site.   Infection.   Temporary fluid retention.   Allergic reaction to anesthetics or medicines used during the procedure. BEFORE THE PROCEDURE   Follow your health care provider's instructions if you are taking dietary supplements or medicines. You may need to stop taking them or reduce your dosage.   Do not take any new dietary supplements or medicines without asking your health care provider first.   Follow your health care provider's instructions about eating and drinking before the procedure. You may need to stop eating and drinking several hours before the procedure.   Arrange to have an adult drive you home after the procedure. PROCEDURE 12. You may need to remove your clothing and dress in an open-back gown so that your health care provider can access your spine.  13. The procedure will be done while you are lying on an X-ray table. Most of the time you will be asked to lie on your stomach, but you may be asked to lie in a different position if  an injection will be made in your neck.  14. Special machines will be used to monitor your oxygen levels, heart rate, and blood pressure.   15. If an injection will be made in your neck, an intravenous (IV) tube will be inserted into one of your veins. Fluids and medicine will flow directly into your body through the IV tube.  16. The area over the facet joint where the injection will be made will be cleaned with an antiseptic soap. The surrounding skin will be covered with sterile drapes.  17. An anesthetic will be applied to your skin to make the injection area numb. You may feel a temporary stinging or burning sensation.  18. A video X-ray machine will be used to locate the joint. A contrast dye may be injected into the facet joint area to help with locating the joint.  19. When the joint is located, an anesthetic medicine will be injected into the joint through the needle.  72. Your health care provider will ask you whether you feel pain relief. If you do feel relief, a steroid may be injected to provide pain relief for a longer period of time. If you do not feel relief or feel only partial relief, additional injections of an anesthetic may be made in other facet joints.  21. The needle will be removed, the skin will be cleansed, and bandages will be applied.  AFTER THE PROCEDURE   You will be observed for 15-30 minutes before being allowed to go home. Do not drive. Have an adult drive you or take a taxi or public transportation instead.   If you feel pain relief, the pain will return in several hours or days when the anesthetic wears off.   You may feel pain relief 2-14 days after the procedure. The amount of time this relief lasts varies from person to person.   It is normal to feel some tenderness over the injected area(s) for 2 days following the procedure.   If you have diabetes, you may have a temporary increase in blood sugar.   This information is not intended to replace advice given to you by your health care provider. Make sure you discuss any questions you have with your health care provider.   Document  Released: 06/05/2006 Document Revised: 02/04/2014 Document Reviewed: 11/04/2011 Elsevier Interactive Patient Education [employer].

## 2015-03-07 NOTE — Progress Notes (Signed)
Subjective:    Patient ID: [name], female    DOB: 1970-07-05, 45 y.o.   MRN: [id number]  HPI  The patient is a 45 year old female who returns to pain management for further evaluation and treatment of pain involving the mid lower back lower extremity region and pain involving the region of the knees. The patient states that she is to undergo further orthopedic evaluation of the knees. The patient states that her lower back lower extremity pain is return to significant degree. Patient states that she is exercising, and avoid aggravation of symptomatology. We discussed patient's condition and will proceed with interventional treatment in pain involving the thoracic oh lumbar region at time return appointment. Patient will undergo further evaluation of knees with orthopedic surgeon and will be considered for further treatment of knees as discussed. The patient continues medications including the use of tramadol Norflex hydrocodone acetaminophen and Voltaren gel. We discussed patient's condition and will continue presently prescribed medications . We will consider patient for lumbar facet, medial branch nerve, blocks performed at time return appointment in attempt to decrease severity of symptoms, minimize progression of symptoms, and avoid the need for more involved treatment. The patient agreed to suggested treatment plan the patient denied any trauma change in events of daily living because no change in symptomatology. The patient has attempted to exercise which could've exacerbated some of her pain of the thoracic oh lumbar lower extremity region. We will proceed with lumbar facet, medial branch nerve, proximal attempt return appointment in attempt to decrease severity of patient's symptoms, minimize progression of symptoms, and avoid the need for more involved treatment. The patient agreed to suggested treatment plan      Review of Systems     Objective:   Physical Exam     There was  tends to palpation of paraspinal muscles and cervical region cervical facet region palpation which reproduces pain of mild degree with mild tenderness of the splenius capitis  and occipitalis musculature regions. There was mild tenderness of the acromioclavicular and glenohumeral joint region. The patient appeared to be unremarkable drop test was unremarkable Spurling's maneuver. The patient was with bilaterally equal grip strength and Tinel and Phalen's maneuver were without increase of pain of significant degree. Palpation over the thoracic paraspinal must reason was attends to palpation of moderate degree especially the lower thoracic paraspinal musculature region was with moderate to moderately severe tenderness to palpation with no crepitus of the thoracic region noted. There was significant muscle spasms of the lower thoracic paraspinal musculature region noted. Palpation over the lumbar paraspinal must reason lumbar facet region was attends to palpation of moderate to moderately severe discomfort with lateral bending rotation extension and palpation of the lumbar facets reproducing moderately severe discomfort. Palpation over the PSIS and PII S region reproduced moderate discomfort. There was mild tenderness of the greater trochanteric region iliotibial band region. Straight leg raise was tolerates approximately 30 without a definite increased pain with dorsiflexion noted. EHL strength appeared to be slightly decreased without a definite sensory deficit of dermatomal dystrophy detected. The knees were with well-healed surgical scars of the knees without increased warmth or erythema in the region scars. There appeared to be negative anterior and posterior drawer signs without ballottement of the patella. There was negative clonus negative Homans. Abdomen was nontender with no costovertebral tenderness noted.      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  Lumbar facet, medial branch nerve, blocks to be performed at time return appointment  F/U PCP  Plotnikov for evaliation of  BP and general medical  condition  F/U surgical evaluation. Follow-up Dr.Daldorf  and neurosurgical eval as planned Follow-up Dr.Dumonski as needed  F/U neurological evaluation. May consider pending follow-up evaluations  May consider radiofrequency rhizolysis or intraspinal procedures pending response to present treatment and F/U evaluation   Patient to call [facility] should patient have concerns prior to scheduled return appointment.

## 2015-03-13 ENCOUNTER — Encounter: Payer: Self-pay | Admitting: Pain Medicine

## 2015-03-13 ENCOUNTER — Ambulatory Visit: Payer: BLUE CROSS/BLUE SHIELD | Attending: Pain Medicine | Admitting: Pain Medicine

## 2015-03-13 VITALS — BP 99/61 | HR 72 | Temp 98.6°F | Resp 16 | Ht 66.0 in | Wt 200.0 lb

## 2015-03-13 DIAGNOSIS — M79605 Pain in left leg: Secondary | ICD-10-CM

## 2015-03-13 DIAGNOSIS — M47816 Spondylosis without myelopathy or radiculopathy, lumbar region: Secondary | ICD-10-CM | POA: Diagnosis not present

## 2015-03-13 DIAGNOSIS — M545 Low back pain: Secondary | ICD-10-CM | POA: Insufficient documentation

## 2015-03-13 DIAGNOSIS — M5136 Other intervertebral disc degeneration, lumbar region: Secondary | ICD-10-CM | POA: Diagnosis not present

## 2015-03-13 DIAGNOSIS — M79604 Pain in right leg: Secondary | ICD-10-CM

## 2015-03-13 DIAGNOSIS — M47814 Spondylosis without myelopathy or radiculopathy, thoracic region: Secondary | ICD-10-CM

## 2015-03-13 DIAGNOSIS — G588 Other specified mononeuropathies: Secondary | ICD-10-CM

## 2015-03-13 DIAGNOSIS — M5481 Occipital neuralgia: Secondary | ICD-10-CM

## 2015-03-13 DIAGNOSIS — M5137 Other intervertebral disc degeneration, lumbosacral region: Secondary | ICD-10-CM

## 2015-03-13 DIAGNOSIS — M172 Bilateral post-traumatic osteoarthritis of knee: Secondary | ICD-10-CM

## 2015-03-13 DIAGNOSIS — M79606 Pain in leg, unspecified: Secondary | ICD-10-CM | POA: Diagnosis not present

## 2015-03-13 DIAGNOSIS — M5134 Other intervertebral disc degeneration, thoracic region: Secondary | ICD-10-CM

## 2015-03-13 DIAGNOSIS — M170 Bilateral primary osteoarthritis of knee: Secondary | ICD-10-CM

## 2015-03-13 DIAGNOSIS — M47894 Other spondylosis, thoracic region: Secondary | ICD-10-CM

## 2015-03-13 DIAGNOSIS — M5459 Other low back pain: Secondary | ICD-10-CM

## 2015-03-13 MED ORDER — MIDAZOLAM HCL 5 MG/5ML IJ SOLN
5.0000 mg | Freq: Once | INTRAMUSCULAR | Status: AC
  Administered 2015-03-13: 4 mg via INTRAVENOUS

## 2015-03-13 MED ORDER — FENTANYL CITRATE (PF) 100 MCG/2ML IJ SOLN
100.0000 ug | Freq: Once | INTRAMUSCULAR | Status: AC
  Administered 2015-03-13: 100 ug via INTRAVENOUS

## 2015-03-13 MED ORDER — FENTANYL CITRATE (PF) 100 MCG/2ML IJ SOLN
INTRAMUSCULAR | Status: AC
  Administered 2015-03-13: 100 ug via INTRAVENOUS
  Filled 2015-03-13: qty 2

## 2015-03-13 MED ORDER — BUPIVACAINE HCL (PF) 0.25 % IJ SOLN
INTRAMUSCULAR | Status: AC
  Administered 2015-03-13: 30 mL
  Filled 2015-03-13: qty 30

## 2015-03-13 MED ORDER — MIDAZOLAM HCL 5 MG/5ML IJ SOLN
INTRAMUSCULAR | Status: AC
  Administered 2015-03-13: 4 mg via INTRAVENOUS
  Filled 2015-03-13: qty 5

## 2015-03-13 MED ORDER — TRIAMCINOLONE ACETONIDE 40 MG/ML IJ SUSP
INTRAMUSCULAR | Status: AC
  Administered 2015-03-13: 40 mg
  Filled 2015-03-13: qty 1

## 2015-03-13 MED ORDER — LACTATED RINGERS IV SOLN: 1000.0000 mL | INTRAVENOUS | Status: DC

## 2015-03-13 MED ORDER — ORPHENADRINE CITRATE 30 MG/ML IJ SOLN
60.0000 mg | Freq: Once | INTRAMUSCULAR | Status: AC
  Administered 2015-03-13: 60 mg via INTRAMUSCULAR

## 2015-03-13 MED ORDER — TRIAMCINOLONE ACETONIDE 40 MG/ML IJ SUSP
40.0000 mg | Freq: Once | INTRAMUSCULAR | Status: AC
  Administered 2015-03-13: 40 mg

## 2015-03-13 MED ORDER — ORPHENADRINE CITRATE 30 MG/ML IJ SOLN
INTRAMUSCULAR | Status: AC
Start: 2015-03-13 — End: 2015-03-13
  Administered 2015-03-13: 60 mg via INTRAMUSCULAR
  Filled 2015-03-13: qty 2

## 2015-03-13 MED ORDER — BUPIVACAINE HCL (PF) 0.25 % IJ SOLN
30.0000 mL | Freq: Once | INTRAMUSCULAR | Status: AC
  Administered 2015-03-13: 30 mL

## 2015-03-13 NOTE — Progress Notes (Signed)
Safety precautions to be maintained throughout the outpatient stay will include: orient to surroundings, keep bed in low position, maintain call bell within reach at all times, provide assistance with transfer out of bed and ambulation.  

## 2015-03-13 NOTE — Progress Notes (Signed)
Subjective:    Patient ID: [name], female    DOB: Apr 13, 1970, 45 y.o.   MRN: [id number]  HPI  PROCEDURE PERFORMED: Lumbar facet (medial branch block)   NOTE: The patient is a 45 y.o. female who returns to [place] for further evaluation and treatment of pain involving the lumbar and lower extremity region. MRI  revealed the patient to be with evidence of degenerative disc disease lumbar spine Degenerative changes L3-4, L4-5, and L5-S1, degenerative changes. The patient is felt to be with significant component of pain due to facet syndrome The risks, benefits, and expectations of the procedure have been discussed and explained to the patient who was understanding and in agreement with suggested treatment plan. We will proceed with interventional treatment as discussed and as explained to the patient who was understanding and wished to proceed with procedure as planned.   DESCRIPTION OF PROCEDURE: Lumbar facet (medial branch block) with IV Versed, IV fentanyl conscious sedation, EKG, blood pressure, pulse, and pulse oximetry monitoring. The procedure was performed with the patient in the prone position. Betadine prep of proposed entry site performed.   NEEDLE PLACEMENT AT: Left L 2 lumbar facet (medial branch block). Under fluoroscopic guidance with oblique orientation of 15 degrees, a 22-gauge needle was inserted at the L 2 vertebral body level with needle placed at the targeted area of Burton's Eye or Eye of the Scotty Dog with documentation of needle placement in the superior and lateral border of targeted area of Burton's Eye or Eye of the Scotty Dog with oblique orientation of 15 degrees. Following documentation of needle placement at the L 2 vertebral body level, needle placement was then accomplished at the L 3 vertebral body level.   NEEDLE PLACEMENT AT L3, L4, and L5 VERTEBRAL BODY LEVELS ON THE LEFT SIDE The procedure was performed at the L3, L4, and L5 vertebral  body levels exactly as was performed at the L 2 vertebral body level utilizing the same technique and under fluoroscopic guidance.  NEEDLE PLACEMENT AT THE SACRAL ALA with AP view of the lumbosacral spine. With the patient in the prone position, Betadine prep of proposed entry site accomplished, a 22 gauge needle was inserted in the region of the sacral ala (groove formed by the superior articulating process of S1 and the sacral wing). Following documentation of needle placement at the sacral ala, .   Needle placement was then verified at all levels on lateral view. Following documentation of needle placement at all levels on lateral view and following negative aspiration for heme and CSF, each level was injected with 1 mL of 0.25% bupivacaine with Kenalog.     LUMBAR FACET, MEDIAL BRANCH NERVE, BLOCKS PERFORMED ON THE RIGHT SIDE  The procedure was performed on the right side exactly as was performed on the left side at the same levels and utilizing the same technique under fluoroscopic guidance.     The patient tolerated the procedure well. A total of 40 mg of Kenalog was utilized for the procedure.   PLAN:  1. Medications: The patient will continue presently prescribed medications tramadol Norflex hydrocodone acetaminophen and Voltaren gel 2. May consider modification of treatment regimen at time of return appointment pending response to treatment rendered on today's visit. 3. The patient is to follow-up with primary care physician  Dr.  Alain Marion for further evaluation of blood pressure and general medical condition status post steroid injection performed on today's visit. 4. Surgical follow-up evaluation. Has been addressed 5.  Neurological follow-up evaluation. Has been addressed 6. The patient may be candidate for radiofrequency procedures, implantation type procedures, and other treatment pending response to treatment and follow-up evaluation. 7. The patient has been advised to call  the [facility] Management Center prior to scheduled return appointment should there be significant change in condition or should patient have other concerns regarding condition prior to scheduled return appointment.  The patient is understanding and in agreement with suggested treatment plan.   Review of Systems     Objective:   Physical Exam        Assessment & Plan:

## 2015-03-13 NOTE — Patient Instructions (Addendum)
PLAN   Continue present medication Norflex diclofenac gel, tramadol and hydrocodone acetaminophen.   F/U PCP  Plotnikov for evaliation of  BP and general medical  condition  F/U surgical evaluation. Follow-up Dr.Daldorf  and neurosurgical eval as planned . Neurosurgical reevaluation as discussed  F/U neurological evaluation. May consider pending follow-up evaluations  May consider radiofrequency rhizolysis or intraspinal procedures pending response to present treatment and F/U evaluation   Patient to call Pain Management Center should patient have concerns prior to scheduled return appointment.Facet Blocks Patient Information  Description: The facets are joints in the spine between the vertebrae.  Like any joints in the body, facets can become irritated and painful.  Arthritis can also effect the facets.  By injecting steroids and local anesthetic in and around these joints, we can temporarily block the nerve supply to them.  Steroids act directly on irritated nerves and tissues to reduce selling and inflammation which often leads to decreased pain.  Facet blocks may be done anywhere along the spine from the neck to the low back depending upon the location of your pain.   After numbing the skin with local anesthetic (like Novocaine), a small needle is passed onto the facet joints under x-ray guidance.  You may experience a sensation of pressure while this is being done.  The entire block usually lasts about 15-25 minutes.   Conditions which may be treated by facet blocks:   Low back/buttock pain  Neck/shoulder pain  Certain types of headaches  Preparation for the injection:  1. Do not eat any solid food or dairy products within 6 hours of your appointment. 2. You may drink clear liquid up to 2 hours before appointment.  Clear liquids include water, black coffee, juice or soda.  No milk or cream please. 3. You may take your regular medication, including pain medications, with a sip of  water before your appointment.  Diabetics should hold regular insulin (if taken separately) and take 1/2 normal NPH dose the morning of the procedure.  Carry some sugar containing items with you to your appointment. 4. A driver must accompany you and be prepared to drive you home after your procedure. 5. Bring all your current medications with you. 6. An IV may be inserted and sedation may be given at the discretion of the physician. 7. A blood pressure cuff, EKG and other monitors will often be applied during the procedure.  Some patients may need to have extra oxygen administered for a short period. 8. You will be asked to provide medical information, including your allergies and medications, prior to the procedure.  We must know immediately if you are taking blood thinners (like Coumadin/Warfarin) or if you are allergic to IV iodine contrast (dye).  We must know if you could possible be pregnant.  Possible side-effects:   Bleeding from needle site  Infection (rare, may require surgery)  Nerve injury (rare)  Numbness & tingling (temporary)  Difficulty urinating (rare, temporary)  Spinal headache (a headache worse with upright posture)  Light-headedness (temporary)  Pain at injection site (serveral days)  Decreased blood pressure (rare, temporary)  Weakness in arm/leg (temporary)  Pressure sensation in back/neck (temporary)   Call if you experience:   Fever/chills associated with headache or increased back/neck pain  Headache worsened by an upright position  New onset, weakness or numbness of an extremity below the injection site  Hives or difficulty breathing (go to the emergency room)  Inflammation or drainage at the injection site(s)  Severe back/neck  pain greater than usual  New symptoms which are concerning to you  Please note:  Although the local anesthetic injected can often make your back or neck feel good for several hours after the injection, the pain  will likely return. It takes 3-7 days for steroids to work.  You may not notice any pain relief for at least one week.  If effective, we will often do a series of 2-3 injections spaced 3-6 weeks apart to maximally decrease your pain.  After the initial series, you may be a candidate for a more permanent nerve block of the facets.  If you have any questions, please call #[phone]) [facility]  General Discharge Instructions :  If you need to reach your doctor call: Monday-Friday 8:00 am - 4:00 pm at [phone] or toll free [phone].  After clinic hours [phone] to have operator reach doctor.  Bring all of your medication bottles to all your appointments in the pain clinic.  To cancel or reschedule your appointment with Pain Management please remember to call 24 hours in advance to avoid a fee.  Refer to the educational materials which you have been given on: General Risks, I had my Procedure. Discharge Instructions, Post Sedation.  Post Procedure Instructions:  The drugs you were given will stay in your system until tomorrow, so for the next 24 hours you should not drive, make any legal decisions or drink any alcoholic beverages.  You may eat anything you prefer, but it is better to start with liquids then soups and crackers, and gradually work up to solid foods.  Please notify your doctor immediately if you have any unusual bleeding, trouble breathing or pain that is not related to your normal pain.  Depending on the type of procedure that was done, some parts of your body may feel week and/or numb.  This usually clears up by tonight or the next day.  Walk with the use of an assistive device or accompanied by an adult for the 24 hours.  You may use ice on the affected area for the first 24 hours.  Put ice in a Ziploc bag and cover with a towel and place against area 15 minutes on 15 minutes off.  You  may switch to heat after 24 hours.

## 2015-03-14 ENCOUNTER — Telehealth: Payer: Self-pay | Admitting: *Deleted

## 2015-03-14 NOTE — Telephone Encounter (Signed)
Attempted to call patient post procedure. Mailbox is full.

## 2015-03-22 ENCOUNTER — Encounter: Payer: Self-pay | Admitting: Family Medicine

## 2015-03-22 DIAGNOSIS — M25562 Pain in left knee: Secondary | ICD-10-CM | POA: Diagnosis not present

## 2015-03-22 DIAGNOSIS — M25561 Pain in right knee: Secondary | ICD-10-CM | POA: Diagnosis not present

## 2015-04-03 ENCOUNTER — Encounter: Payer: Self-pay | Admitting: Pain Medicine

## 2015-04-03 ENCOUNTER — Ambulatory Visit: Payer: BLUE CROSS/BLUE SHIELD | Attending: Pain Medicine | Admitting: Pain Medicine

## 2015-04-03 VITALS — BP 139/92 | HR 72 | Temp 98.0°F | Resp 16 | Ht 66.0 in | Wt 200.0 lb

## 2015-04-03 DIAGNOSIS — G588 Other specified mononeuropathies: Secondary | ICD-10-CM | POA: Diagnosis not present

## 2015-04-03 DIAGNOSIS — M5136 Other intervertebral disc degeneration, lumbar region: Secondary | ICD-10-CM

## 2015-04-03 DIAGNOSIS — M170 Bilateral primary osteoarthritis of knee: Secondary | ICD-10-CM | POA: Diagnosis not present

## 2015-04-03 DIAGNOSIS — M533 Sacrococcygeal disorders, not elsewhere classified: Secondary | ICD-10-CM | POA: Insufficient documentation

## 2015-04-03 DIAGNOSIS — M5137 Other intervertebral disc degeneration, lumbosacral region: Secondary | ICD-10-CM

## 2015-04-03 DIAGNOSIS — M5134 Other intervertebral disc degeneration, thoracic region: Secondary | ICD-10-CM

## 2015-04-03 DIAGNOSIS — M542 Cervicalgia: Secondary | ICD-10-CM | POA: Diagnosis present

## 2015-04-03 DIAGNOSIS — M5459 Other low back pain: Secondary | ICD-10-CM

## 2015-04-03 DIAGNOSIS — M47814 Spondylosis without myelopathy or radiculopathy, thoracic region: Secondary | ICD-10-CM

## 2015-04-03 DIAGNOSIS — M79604 Pain in right leg: Secondary | ICD-10-CM

## 2015-04-03 DIAGNOSIS — M4804 Spinal stenosis, thoracic region: Secondary | ICD-10-CM | POA: Diagnosis not present

## 2015-04-03 DIAGNOSIS — M546 Pain in thoracic spine: Secondary | ICD-10-CM | POA: Diagnosis present

## 2015-04-03 DIAGNOSIS — M172 Bilateral post-traumatic osteoarthritis of knee: Secondary | ICD-10-CM

## 2015-04-03 DIAGNOSIS — M79605 Pain in left leg: Secondary | ICD-10-CM

## 2015-04-03 DIAGNOSIS — M47816 Spondylosis without myelopathy or radiculopathy, lumbar region: Secondary | ICD-10-CM | POA: Diagnosis not present

## 2015-04-03 DIAGNOSIS — M5481 Occipital neuralgia: Secondary | ICD-10-CM

## 2015-04-03 DIAGNOSIS — M47894 Other spondylosis, thoracic region: Secondary | ICD-10-CM

## 2015-04-03 DIAGNOSIS — M545 Low back pain: Secondary | ICD-10-CM

## 2015-04-03 MED ORDER — TRAMADOL HCL 50 MG PO TABS: ORAL_TABLET | ORAL | Status: DC

## 2015-04-03 MED ORDER — HYDROCODONE-ACETAMINOPHEN 5-325MG PREPACK (~~LOC~~: ORAL_TABLET | ORAL | Status: DC

## 2015-04-03 NOTE — Progress Notes (Signed)
Safety precautions to be maintained throughout the outpatient stay will include: orient to surroundings, keep bed in low position, maintain call bell within reach at all times, provide assistance with transfer out of bed and ambulation.  

## 2015-04-03 NOTE — Progress Notes (Signed)
Subjective:    Patient ID: [name], female    DOB: 1970-12-31, 45 y.o.   MRN: [id number]  HPI  The patient is a 45 year old female who returns to pain management for further evaluation and treatment of pain involving the region of the neck upper back mid back lower back and lower extremity regions. The patient states that she has return of severe pain involving the mid back region. The patient is with known degenerative changes of the thoracic spine and is with significant muscle spasms. We discussed patient's condition and patient continues medications consisting of Norflex, tramadol ,Voltaren gel and hydrocodone acetaminophen as prescribed. The patient is without plans for surgical intervention following surgical evaluation. We will proceed with intercostal nerve blocks in attempt to decrease severity of patient's symptoms, minimize progression of symptoms, and avoid the need for more involved treatment. The patient is in agreement with suggested treatment plan.       Review of Systems     Objective:   Physical Exam  There was tenderness to palpation of the paraspinal muscular region cervical region cervical facet region of moderate degree. There was moderate tenderness over the splenius capitis and a separate talus muscles regions. Palpation of the acromioclavicular and glenohumeral joint regions reproduce moderate discomfort with moderate to moderately severe tenderness to palpation of the trapezius levator scapula and rhomboid musculature regions. There was tenderness over the thoracic paraspinal muscular treat region a moderate to moderately severe degree with no crepitus of the thoracic region noted. The patient appeared to be with bilaterally equal grip strength and Tinel and Phalen's maneuver were without increase of pain of significant degree. Palpation over the lumbar paraspinal must reason lumbar facet region was with mild to moderate discomfort with lateral bending rotation  extension and palpation over the lumbar facets reproducing mild to moderate discomfort. Palpation of the PSIS and PII S regions reproduce mild to moderate discomfort with mild tenderness along the greater trochanteric region and iliotibial band region. Straight leg raise was tolerates approximately 20 without increased pain with dorsiflexion noted. There were well-healed surgical scars of the knees without increased warmth and erythema in the region of the knees with tenderness to palpation of the knees and crepitus of the knees. There was no ballottement of the patella. There was negative anterior and posterior drawer signs without erythema of the knees noted. No sensory deficit of dermatomal speech detected. There was negative clonus negative Homans. Abdomen was nontender with no costovertebral tenderness noted. The predominant portion of patient's pain was reproduced with palpation over the thoracic paraspinal musculature region.      Assessment & Plan:    Degenerative disc disease of the thoracic spine 1. Stable thoracic spine since 2014. No acute osseous abnormality. 2. Mild disc degeneration most pronounced at T10-T11 contributing to mild spinal stenosis at that level. Stable minimal cord mass effect with no cord signal abnormality. 3. 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 Follow-up Dr.Daldorf  and  neurosurgical eval as planned Follow-up Dr.Dumonski as needed  F/U neurological evaluation. May consider pending follow-up evaluations  May consider radiofrequency rhizolysis or intraspinal procedures pending response to present treatment and F/U evaluation   Patient to call Pain Management Center should patient have concerns prior to scheduled return appointment

## 2015-04-03 NOTE — Patient Instructions (Addendum)
PLAN   Continue present medication Norflex hydrocodone acetaminophen diclofenac gel and tramadol  Intercostal nerve block to be performed at time of return appointment  F/U PCP  Plotnikov for evaliation of  BP and general medical  condition  F/U surgical evaluation. Follow-up Dr.Daldorf  and neurosurgical eval as planned Follow-up Dr.Dumonski as needed  F/U neurological evaluation. May consider pending follow-up evaluations  May consider radiofrequency rhizolysis or intraspinal procedures pending response to present treatment and F/U evaluation   Patient to call [facility] Management Center should patient have concerns prior to scheduled return appointmentPain Management Discharge Instructions  General Discharge Instructions :  If you need to reach your doctor call: Monday-Friday 8:00 am - 4:00 pm at [phone] or toll free [phone].  After clinic hours [phone] to have operator reach doctor.  Bring all of your medication bottles to all your appointments in the pain clinic.  To cancel or reschedule your appointment with Pain Management please remember to call 24 hours in advance to avoid a fee.  Refer to the educational materials which you have been given on: General Risks, I had my Procedure. Discharge Instructions, Post Sedation.  Post Procedure Instructions:  The drugs you were given will stay in your system until tomorrow, so for the next 24 hours you should not drive, make any legal decisions or drink any alcoholic beverages.  You may eat anything you prefer, but it is better to start with liquids then soups and crackers, and gradually work up to solid foods.  Please notify your doctor immediately if you have any unusual bleeding, trouble breathing or pain that is not related to your normal pain.  Depending on the type of procedure that was done, some parts of your body may feel week and/or numb.  This usually clears up by tonight or the next day.  Walk with the use of  an assistive device or accompanied by an adult for the 24 hours.  You may use ice on the affected area for the first 24 hours.  Put ice in a Ziploc bag and cover with a towel and place against area 15 minutes on 15 minutes off.  You may switch to heat after 24 hours.GENERAL RISKS AND COMPLICATIONS  What are the risk, side effects and possible complications? Generally speaking, most procedures are safe.  However, with any procedure there are risks, side effects, and the possibility of complications.  The risks and complications are dependent upon the sites that are lesioned, or the type of nerve block to be performed.  The closer the procedure is to the spine, the more serious the risks are.  Great care is taken when placing the radio frequency needles, block needles or lesioning probes, but sometimes complications can occur. 1. Infection: Any time there is an injection through the skin, there is a risk of infection.  This is why sterile conditions are used for these blocks.  There are four possible types of infection. 1. Localized skin infection. 2. Central Nervous System Infection-This can be in the form of Meningitis, which can be deadly. 3. Epidural Infections-This can be in the form of an epidural abscess, which can cause pressure inside of the spine, causing compression of the spinal cord with subsequent paralysis. This would require an emergency surgery to decompress, and there are no guarantees that the patient would recover from the paralysis. 4. Discitis-This is an infection of the intervertebral discs.  It occurs in about 1% of discography procedures.  It is difficult to treat and  it may lead to surgery.        2. Pain: the needles have to go through skin and soft tissues, will cause soreness.       3. Damage to internal structures:  The nerves to be lesioned may be near blood vessels or    other nerves which can be potentially damaged.       4. Bleeding: Bleeding is more common if the  patient is taking blood thinners such as  aspirin, Coumadin, Ticiid, Plavix, etc., or if he/she have some genetic predisposition  such as hemophilia. Bleeding into the spinal canal can cause compression of the spinal  cord with subsequent paralysis.  This would require an emergency surgery to  decompress and there are no guarantees that the patient would recover from the  paralysis.       5. Pneumothorax:  Puncturing of a lung is a possibility, every time a needle is introduced in  the area of the chest or upper back.  Pneumothorax refers to free air around the  collapsed lung(s), inside of the thoracic cavity (chest cavity).  Another two possible  complications related to a similar event would include: Hemothorax and Chylothorax.   These are variations of the Pneumothorax, where instead of air around the collapsed  lung(s), you may have blood or chyle, respectively.       6. Spinal headaches: They may occur with any procedures in the area of the spine.       7. Persistent CSF (Cerebro-Spinal Fluid) leakage: This is a rare problem, but may occur  with prolonged intrathecal or epidural catheters either due to the formation of a fistulous  track or a dural tear.       8. Nerve damage: By working so close to the spinal cord, there is always a possibility of  nerve damage, which could be as serious as a permanent spinal cord injury with  paralysis.       9. Death:  Although rare, severe deadly allergic reactions known as "Anaphylactic  reaction" can occur to any of the medications used.      10. Worsening of the symptoms:  We can always make thing worse.  What are the chances of something like this happening? Chances of any of this occuring are extremely low.  By statistics, you have more of a chance of getting killed in a motor vehicle accident: while driving to the hospital than any of the above occurring .  Nevertheless, you should be aware that they are possibilities.  In general, it is similar to taking a  shower.  Everybody knows that you can slip, hit your head and get killed.  Does that mean that you should not shower again?  Nevertheless always keep in mind that statistics do not mean anything if you happen to be on the wrong side of them.  Even if a procedure has a 1 (one) in a 1,000,000 (million) chance of going wrong, it you happen to be that one..Also, keep in mind that by statistics, you have more of a chance of having something go wrong when taking medications.  Who should not have this procedure? If you are on a blood thinning medication (e.g. Coumadin, Plavix, see list of "Blood Thinners"), or if you have an active infection going on, you should not have the procedure.  If you are taking any blood thinners, please inform your physician.  How should I prepare for this procedure?  Do not eat or drink anything  at least six hours prior to the procedure.  Bring a driver with you .  It cannot be a taxi.  Come accompanied by an adult that can drive you back, and that is strong enough to help you if your legs get weak or numb from the local anesthetic.  Take all of your medicines the morning of the procedure with just enough water to swallow them.  If you have diabetes, make sure that you are scheduled to have your procedure done first thing in the morning, whenever possible.  If you have diabetes, take only half of your insulin dose and notify our nurse that you have done so as soon as you arrive at the clinic.  If you are diabetic, but only take blood sugar pills (oral hypoglycemic), then do not take them on the morning of your procedure.  You may take them after you have had the procedure.  Do not take aspirin or any aspirin-containing medications, at least eleven (11) days prior to the procedure.  They may prolong bleeding.  Wear loose fitting clothing that may be easy to take off and that you would not mind if it got stained with Betadine or blood.  Do not wear any jewelry or  perfume  Remove any nail coloring.  It will interfere with some of our monitoring equipment.  NOTE: Remember that this is not meant to be interpreted as a complete list of all possible complications.  Unforeseen problems may occur.  BLOOD THINNERS The following drugs contain aspirin or other products, which can cause increased bleeding during surgery and should not be taken for 2 weeks prior to and 1 week after surgery.  If you should need take something for relief of minor pain, you may take acetaminophen which is found in Tylenol,m Datril, Anacin-3 and Panadol. It is not blood thinner. The products listed below are.  Do not take any of the products listed below in addition to any listed on your instruction sheet.  A.P.C or A.P.C with Codeine Codeine Phosphate Capsules #3 Ibuprofen Ridaura  ABC compound Congesprin Imuran rimadil  Advil Cope Indocin Robaxisal  Alka-Seltzer Effervescent Pain Reliever and Antacid Coricidin or Coricidin-D  Indomethacin Rufen  Alka-Seltzer plus Cold Medicine Cosprin Ketoprofen S-A-C Tablets  Anacin Analgesic Tablets or Capsules Coumadin Korlgesic Salflex  Anacin Extra Strength Analgesic tablets or capsules CP-2 Tablets Lanoril Salicylate  Anaprox Cuprimine Capsules Levenox Salocol  Anexsia-D Dalteparin Magan Salsalate  Anodynos Darvon compound Magnesium Salicylate Sine-off  Ansaid Dasin Capsules Magsal Sodium Salicylate  Anturane Depen Capsules Marnal Soma  APF Arthritis pain formula Dewitt's Pills Measurin Stanback  Argesic Dia-Gesic Meclofenamic Sulfinpyrazone  Arthritis Bayer Timed Release Aspirin Diclofenac Meclomen Sulindac  Arthritis pain formula Anacin Dicumarol Medipren Supac  Analgesic (Safety coated) Arthralgen Diffunasal Mefanamic Suprofen  Arthritis Strength Bufferin Dihydrocodeine Mepro Compound Suprol  Arthropan liquid Dopirydamole Methcarbomol with Aspirin Synalgos  ASA tablets/Enseals Disalcid Micrainin Tagament  Ascriptin Doan's Midol  Talwin  Ascriptin A/D Dolene Mobidin Tanderil  Ascriptin Extra Strength Dolobid Moblgesic Ticlid  Ascriptin with Codeine Doloprin or Doloprin with Codeine Momentum Tolectin  Asperbuf Duoprin Mono-gesic Trendar  Aspergum Duradyne Motrin or Motrin IB Triminicin  Aspirin plain, buffered or enteric coated Durasal Myochrisine Trigesic  Aspirin Suppositories Easprin Nalfon Trillsate  Aspirin with Codeine Ecotrin Regular or Extra Strength Naprosyn Uracel  Atromid-S Efficin Naproxen Ursinus  Auranofin Capsules Elmiron Neocylate Vanquish  Axotal Emagrin Norgesic Verin  Azathioprine Empirin or Empirin with Codeine Normiflo Vitamin E  Azolid Emprazil Nuprin Voltaren  Bayer Aspirin plain, buffered or children's or timed BC Tablets or powders Encaprin Orgaran Warfarin Sodium  Buff-a-Comp Enoxaparin Orudis Zorpin  Buff-a-Comp with Codeine Equegesic Os-Cal-Gesic   Buffaprin Excedrin plain, buffered or Extra Strength Oxalid   Bufferin Arthritis Strength Feldene Oxphenbutazone   Bufferin plain or Extra Strength Feldene Capsules Oxycodone with Aspirin   Bufferin with Codeine Fenoprofen Fenoprofen Pabalate or Pabalate-SF   Buffets II Flogesic Panagesic   Buffinol plain or Extra Strength Florinal or Florinal with Codeine Panwarfarin   Buf-Tabs Flurbiprofen Penicillamine   Butalbital Compound Four-way cold tablets Penicillin   Butazolidin Fragmin Pepto-Bismol   Carbenicillin Geminisyn Percodan   Carna Arthritis Reliever Geopen Persantine   Carprofen Gold's salt Persistin   Chloramphenicol Goody's Phenylbutazone   Chloromycetin Haltrain Piroxlcam   Clmetidine heparin Plaquenil   Cllnoril Hyco-pap Ponstel   Clofibrate Hydroxy chloroquine Propoxyphen         Before stopping any of these medications, be sure to consult the physician who ordered them.  Some, such as Coumadin (Warfarin) are ordered to prevent or treat serious conditions such as "deep thrombosis", "pumonary embolisms", and other heart  problems.  The amount of time that you may need off of the medication may also vary with the medication and the reason for which you were taking it.  If you are taking any of these medications, please make sure you notify your pain physician before you undergo any procedures.         Intercostal Nerve Block Patient Information  Description: The twelve intercostal nerves arise from the first thru twelfth thoracic nerve roots.  The nerve begins at the spine and wraps around the body, lying in a groove underneath each rib.  Each intercostal nerve innervates a specific strip of skin and body walk of the abdomen and chest.  Therefore, injuries of the chest wall or abdominal wall result in pain that is transmitted back to the brian via the intercostal nerves.  Examples of such injuries include rib fractures and incisions for lung and gall bladder surgery.  Occasionally, pain may persist long after an injury or surgical incision secondary to inflammation and irritation of the intercostal nerve.  The longstanding pain is known as intercostal neuralgia.  An intercostal nerve block is preformed to eliminate pain either temporarily or permanently.  A small needle is placed below the rib and local anesthetic (like Novocaine) and possibly steroid is injected.  Usually 2-4 intercostal nerves are blocked at a time depending on the problem.  The patient will experience a slight "pin-prick" sensation for each injection.  Shortly thereafter, the strip of skin that is innervated by the blocked intercostal nerve will feel numb.  Persistent pain that is only temporarily relieved with local anesthetic may require a more permanent block. This procedure is called Cryoneurolysis and entails placing a small probe beneath the rib to freeze the nerve.  Conditions that may be treated by intercostal nerve blocks:   Rib fractures  Longstanding pain from surgery of the chest or abdomen (intercostal neuralgia)  Pain from chest  tubes  Pain from trauma to the chest  Preparation for the injections:  1. Do not eat any solid food or dairy products within 8 hours of your appointment. 2. You may drink clear liquids up to 3 hours before appointment.  Clear liquids include water, black coffee, juice or soda.  No milk or cream please. 3. You may take your regular medication, including pain medications, with a sip of water before your appointment.  Diabetics should hold regular insulin (if take separately) and take 1/2 normal NPH dose the morning of the procedure.   Carry some sugar containing items with you to your appointment. 4. A driver must accompany you and be prepared to drive you home after your procedure. 5. Bring all your current medications with you. 6. An IV may be inserted and sedation may be given at the discretion of the physician. 7. A blood pressure cuff, EKG and other monitors will often be applied during the procedure.  Some patients may need to have extra oxygen administered for a short period. 8. You will be asked to provide medical information, including your allergies, prior to the procedure.  We must know immediately if you are taking blood thinners (like Coumadin/Warfarin) or if you are allergic to IV iodine contrast (dye). We must know if you could possible be pregnant.  Possible side-effects:   Bleeding from needle site  Infection (rare)  Nerve injury (rare)  Numbness & tingling of skin  Collapsed lung requiring chest tube (rare)  Local anesthetic toxicity (rare)  Light-headedness (temporary)  Pain at injection site (several days)  Decreased blood pressure (temporary)  Shortness of breath  Jittery/shaking sensation (temporary)  Call if you experience:   Difficulty breathing or hives (go directly to the emergency room)  Redness, inflammation or drainage at the injection site  Severe pain at the site of the injection  Any new symptoms which are concerning   Please  note:  Your pain may subside immediately but may return several hours after the injection.  Often, more than one injection is required to reduce the pain. Also, if several temporary blocks with local anesthetic are ineffective, a more permanent block with cryolysis may be necessary.  This will be discussed with you should this be the case.  If you have any questions, please call [phone] [facility]

## 2015-04-04 ENCOUNTER — Ambulatory Visit: Payer: Self-pay | Admitting: Pain Medicine

## 2015-04-10 ENCOUNTER — Other Ambulatory Visit: Payer: Self-pay

## 2015-04-10 ENCOUNTER — Telehealth: Payer: Self-pay

## 2015-04-10 MED ORDER — PANTOPRAZOLE SODIUM 40 MG PO TBEC: 40.0000 mg | DELAYED_RELEASE_TABLET | Freq: Every day | ORAL | Status: DC

## 2015-04-10 NOTE — Telephone Encounter (Signed)
Called in to [employer]

## 2015-04-10 NOTE — Telephone Encounter (Signed)
Recd faxed rx request from [facility], fax [phone], tele [phone] for meloxicam 15mg  tab----please advise, thanks

## 2015-04-10 NOTE — Telephone Encounter (Signed)
OK to fill this prescription with additional refills x3 Thank you!  

## 2015-04-12 ENCOUNTER — Encounter: Payer: Self-pay | Admitting: Pain Medicine

## 2015-04-12 ENCOUNTER — Ambulatory Visit: Payer: BLUE CROSS/BLUE SHIELD | Attending: Pain Medicine | Admitting: Pain Medicine

## 2015-04-12 VITALS — BP 119/81 | HR 82 | Temp 98.6°F | Resp 20 | Ht 66.0 in | Wt 200.0 lb

## 2015-04-12 DIAGNOSIS — M62830 Muscle spasm of back: Secondary | ICD-10-CM | POA: Diagnosis not present

## 2015-04-12 DIAGNOSIS — M546 Pain in thoracic spine: Secondary | ICD-10-CM | POA: Diagnosis present

## 2015-04-12 DIAGNOSIS — M5136 Other intervertebral disc degeneration, lumbar region: Secondary | ICD-10-CM

## 2015-04-12 DIAGNOSIS — M47894 Other spondylosis, thoracic region: Secondary | ICD-10-CM

## 2015-04-12 DIAGNOSIS — M47814 Spondylosis without myelopathy or radiculopathy, thoracic region: Secondary | ICD-10-CM

## 2015-04-12 DIAGNOSIS — M47816 Spondylosis without myelopathy or radiculopathy, lumbar region: Secondary | ICD-10-CM

## 2015-04-12 DIAGNOSIS — G588 Other specified mononeuropathies: Secondary | ICD-10-CM

## 2015-04-12 DIAGNOSIS — M172 Bilateral post-traumatic osteoarthritis of knee: Secondary | ICD-10-CM

## 2015-04-12 DIAGNOSIS — M79605 Pain in left leg: Secondary | ICD-10-CM

## 2015-04-12 DIAGNOSIS — M170 Bilateral primary osteoarthritis of knee: Secondary | ICD-10-CM

## 2015-04-12 DIAGNOSIS — M545 Low back pain: Secondary | ICD-10-CM

## 2015-04-12 DIAGNOSIS — M5481 Occipital neuralgia: Secondary | ICD-10-CM

## 2015-04-12 DIAGNOSIS — M5137 Other intervertebral disc degeneration, lumbosacral region: Secondary | ICD-10-CM

## 2015-04-12 DIAGNOSIS — M5134 Other intervertebral disc degeneration, thoracic region: Secondary | ICD-10-CM | POA: Diagnosis not present

## 2015-04-12 DIAGNOSIS — M5459 Other low back pain: Secondary | ICD-10-CM

## 2015-04-12 DIAGNOSIS — M4804 Spinal stenosis, thoracic region: Secondary | ICD-10-CM | POA: Diagnosis not present

## 2015-04-12 DIAGNOSIS — M79604 Pain in right leg: Secondary | ICD-10-CM

## 2015-04-12 MED ORDER — BUPIVACAINE HCL (PF) 0.25 % IJ SOLN
INTRAMUSCULAR | Status: AC
  Administered 2015-04-12: 30 mL
  Filled 2015-04-12: qty 30

## 2015-04-12 MED ORDER — TRIAMCINOLONE ACETONIDE 40 MG/ML IJ SUSP
40.0000 mg | Freq: Once | INTRAMUSCULAR | Status: AC
  Administered 2015-04-12: 40 mg

## 2015-04-12 MED ORDER — FENTANYL CITRATE (PF) 100 MCG/2ML IJ SOLN
100.0000 ug | Freq: Once | INTRAMUSCULAR | Status: AC
  Administered 2015-04-12: 100 ug via INTRAVENOUS

## 2015-04-12 MED ORDER — MIDAZOLAM HCL 5 MG/5ML IJ SOLN: 5.0000 mg | Freq: Once | INTRAMUSCULAR | Status: DC

## 2015-04-12 MED ORDER — TRIAMCINOLONE ACETONIDE 40 MG/ML IJ SUSP
INTRAMUSCULAR | Status: AC
  Administered 2015-04-12: 40 mg
  Filled 2015-04-12: qty 1

## 2015-04-12 MED ORDER — ORPHENADRINE CITRATE 30 MG/ML IJ SOLN
INTRAMUSCULAR | Status: AC
  Administered 2015-04-12: 60 mg via INTRAMUSCULAR
  Filled 2015-04-12: qty 2

## 2015-04-12 MED ORDER — LACTATED RINGERS IV SOLN: 1000.0000 mL | INTRAVENOUS | Status: DC

## 2015-04-12 MED ORDER — ORPHENADRINE CITRATE 30 MG/ML IJ SOLN
60.0000 mg | Freq: Once | INTRAMUSCULAR | Status: AC
  Administered 2015-04-12: 60 mg via INTRAMUSCULAR

## 2015-04-12 MED ORDER — FENTANYL CITRATE (PF) 100 MCG/2ML IJ SOLN
INTRAMUSCULAR | Status: AC
  Administered 2015-04-12: 100 ug via INTRAVENOUS
  Filled 2015-04-12: qty 2

## 2015-04-12 MED ORDER — MIDAZOLAM HCL 5 MG/5ML IJ SOLN
INTRAMUSCULAR | Status: AC
  Filled 2015-04-12: qty 5

## 2015-04-12 MED ORDER — BUPIVACAINE HCL (PF) 0.25 % IJ SOLN
30.0000 mL | Freq: Once | INTRAMUSCULAR | Status: AC
  Administered 2015-04-12: 30 mL

## 2015-04-12 NOTE — Progress Notes (Signed)
Subjective:    Patient ID: [name], female    DOB: 03/05/1970, 45 y.o.   MRN: [id number]  HPI  PROCEDURE PERFORMED:  Intercostal nerve block.  HISTORY OF PRESENT ILLNESS:  The patient is a 45 y.o. female who returns to the [facility] for further evaluation and treatment of pain involving the midportion of the back revealed the patient is with known degenerative changes of the thoracic region with prior studies of the thoracic region revealing patient to be with evidence of .1. Stable thoracic spine since 2014. No acute osseous abnormality. 2. Mild disc degeneration most pronounced at T10-T11 contributing to mild spinal stenosis at that level. Stable minimal cord mass effect with no cord signal abnormality. 3. More widespread facet and ligament flavum hypertrophy also appears stable. Associated mild left T2 foraminal stenosis has not significantly changed.  There is concern regarding the patient's pain being due to significant component of intercostal neuralgia in addition to abnormalities noted on thoracic MRI with severe muscle spasms of the thoracic region is well. The risks, benefits, and expectations of the procedure have been discussed and explained to the patient who was understanding and in agreement with suggested treatment plan. We will proceed with interventional treatment as discussed.   DESCRIPTION OF PROCEDURE: Intercostal nerve block with IV Versed, IV fentanyl conscious sedation, EKG, blood pressure, pulse, and pulse oximetry monitoring. The procedure was performed with the patient in the prone position under fluoroscopic guidance.   Intercostal nerve block, right side: With the patient in the prone position, Betadine prep of proposed entry site was performed under fluoroscopic guidance with AP view of the thoracic spine. Under fluoroscopic guidance, a 22 -gauge needle was inserted to contact bone of the 10th rib on the right side after which the needle  was repositioned at the inferior border of the 10th rib on the right side under fluoroscopic guidance. Following documentation of needle placement, at the inferior border of the 10th rib on the right side and negative aspiration, a total of 3 mL of 0.25% bupivacaine with Kenalog was injected for right side for 10th rib intercostal nerve block.   INTERCOSTAL NERVE BLOCKS AT T9, T8, T7, T6, and T5 LEVELS: The procedure was performed at these levels as was performed at the previous level, T 10, utilizing the same technique and under fluoroscopic guidance  Myoneural block injection of the thoracic region Following Betadine prep of proposed entry site a 22-gauge needle was inserted in the thoracic musculature region and following negative aspiration 1 cc of 0.25% bupivacaine with Norflex was injected for myoneural block injection of the thoracic region 2  The patient tolerated the procedure well  A total of 10 mg Kenalog was utilized for the procedure.   PLAN:   1. Medications: We will continue presently prescribed medications Norflex tramadol hydrocodone acetaminophen and Voltaren gel. 2. The patient is to follow up with primary care physician Dr.Plotnikov  for further evaluation of blood pressure and general medical condition as discussed. 3. Surgical evaluation. . The patient will undergo further surgical evaluation as discussed  4. Neurological evaluation.. May consider additional studies  5. May consider the patient for additional studies pending response to treatment and follow-up evaluation. 6. May consider radiofrequency procedures, implantation type procedures and other treatment pending response to treatment and follow-up evaluation. 7. The patient has been advised to adhere to proper body mechanics and to call the Pain Management Center prior to scheduled return appointment should there be significant change in  condition or have other concerns regarding condition prior to scheduled return  appointment.  The patient is understanding and in agreement with suggested treatment plan.    Review of Systems     Objective:   Physical Exam        Assessment & Plan:

## 2015-04-12 NOTE — Progress Notes (Signed)
Patient here for procedure for mid back pain.  Safety precautions to be maintained throughout the outpatient stay will include: orient to surroundings, keep bed in low position, maintain call bell within reach at all times, provide assistance with transfer out of bed and ambulation.

## 2015-04-12 NOTE — Patient Instructions (Signed)
PLAN   Continue present medication Norflex hydrocodone acetaminophen diclofenac gel and tramadol  F/U PCP  Plotnikov for evaliation of  BP and general medical  condition  F/U surgical evaluation. Follow-up Dr.Daldorf  and neurosurgical eval as planned Follow-up Dr.Dumonski as needed  F/U neurological evaluation. May consider pending follow-up evaluations  May consider radiofrequency rhizolysis or intraspinal procedures pending response to present treatment and F/U evaluation   Patient to call Pain Management Center should patient have concerns prior to scheduled return appointment

## 2015-04-13 ENCOUNTER — Telehealth: Payer: Self-pay | Admitting: *Deleted

## 2015-04-13 NOTE — Telephone Encounter (Signed)
Patient states she is doing well after procedure, verbalizes no complaints or concerns.

## 2015-04-18 ENCOUNTER — Encounter: Payer: Self-pay | Admitting: Internal Medicine

## 2015-04-19 ENCOUNTER — Other Ambulatory Visit: Payer: Self-pay | Admitting: Internal Medicine

## 2015-04-21 DIAGNOSIS — M222X2 Patellofemoral disorders, left knee: Secondary | ICD-10-CM | POA: Diagnosis not present

## 2015-04-21 DIAGNOSIS — M222X1 Patellofemoral disorders, right knee: Secondary | ICD-10-CM | POA: Diagnosis not present

## 2015-04-28 ENCOUNTER — Other Ambulatory Visit: Payer: Self-pay

## 2015-04-28 DIAGNOSIS — M222X2 Patellofemoral disorders, left knee: Secondary | ICD-10-CM | POA: Diagnosis not present

## 2015-04-28 DIAGNOSIS — Z1231 Encounter for screening mammogram for malignant neoplasm of breast: Secondary | ICD-10-CM

## 2015-04-28 DIAGNOSIS — M222X1 Patellofemoral disorders, right knee: Secondary | ICD-10-CM | POA: Diagnosis not present

## 2015-05-02 ENCOUNTER — Ambulatory Visit (INDEPENDENT_AMBULATORY_CARE_PROVIDER_SITE_OTHER): Payer: BLUE CROSS/BLUE SHIELD | Admitting: Psychiatry

## 2015-05-02 ENCOUNTER — Encounter: Payer: Self-pay | Admitting: Psychiatry

## 2015-05-02 VITALS — BP 122/84 | HR 103 | Temp 97.8°F | Ht 66.0 in | Wt 206.0 lb

## 2015-05-02 DIAGNOSIS — F319 Bipolar disorder, unspecified: Secondary | ICD-10-CM

## 2015-05-02 DIAGNOSIS — F411 Generalized anxiety disorder: Secondary | ICD-10-CM | POA: Diagnosis not present

## 2015-05-02 DIAGNOSIS — F988 Other specified behavioral and emotional disorders with onset usually occurring in childhood and adolescence: Secondary | ICD-10-CM

## 2015-05-02 DIAGNOSIS — F909 Attention-deficit hyperactivity disorder, unspecified type: Secondary | ICD-10-CM

## 2015-05-02 NOTE — Progress Notes (Signed)
Patient ID: [name], female   DOB: [birth year], 45 y.o.   MRN: [id number] [facility] MD/PA/NP OP Progress Note  05/02/2015 2:32 PM [name]  MRN:  [id number]  Subjective:  Patient returns a follow-up or bipolar type II, generalized anxiety disorder and ADHD. Patient was previously seen by Dr. Jimmye Norman and this is the first visit for this patient with this clinician. Patient reports that she's been feeling very anxious and having difficulty sleeping. States that her mother's breast cancer has come back and her mother has only Days to live. States that her mother lives with her and it has been very stressful for her. States that it has been difficult for her to take care of herself and she is unable to take care of her mother. Patient reports being compliant with all her medications. As with patient that she is at the maximum dosage of most of her medications and that the stimulant medication Vyvanse was probably causing her problems with sleep. However patient did not want to get off the Vyvanse. Also discussed with her that benzodiazepines are short-term therapy for anxiety and for the long-term need to look at other medications. Patient is insistent on changing her sleep medications at this time. She is not interested in looking at other options.   Chief Complaint:  Chief Complaint    Follow-up; Medication Refill; Insomnia; Stress; Depression; Anxiety     Visit Diagnosis:   No diagnosis found.  Past Medical History:  Past Medical History  Diagnosis Date  . Attention deficit disorder without mention of hyperactivity   . Anxiety state, unspecified   . Pain in joint, site unspecified   . Unspecified asthma(493.90)   . Migraine without aura, without mention of intractable migraine without mention of status migrainosus   . Depressive disorder, not elsewhere classified   . Unspecified essential hypertension   . Unspecified vitamin D deficiency   . Hypertension   . Esophageal reflux      Past Surgical History  Procedure Laterality Date  . Partial hysterectomy  2007  . Tonsillectomy  04/29/00  . Incise and drain abcess  2011    Right axilla- Dr. Barkley Bruns  . Abdominal ultrasound  10/12/97  . Plantar fascia surgery  2000  . Transthoracic echocardiogram  11/2009    EF=>55%; trace MR & TR  . Nm myocar perf wall motion  11/2009    bruce myoview; perfusion defect in anterior region consistent with breast attenuation; remaining myocardium with normal perfusion; post-stress EF 74%; low risk scan   . Appendectomy  05/27/2013    gangrenous  . Abdominal hysterectomy    . Tonsillectomy Bilateral   . Knee arthroscopy      both knees   Family History:  Family History  Problem Relation Age of Onset  . Asthma      Family history  . Coronary artery disease      1st degree female < 50  . Diabetes      1st degree relative  . Breast cancer Mother   . Multiple sclerosis Mother   . Cancer Mother 9    breast ca  . Depression Mother   . Lupus Sister     PTSD  . Alcohol abuse Sister   . Bipolar disorder Sister   . Alcohol abuse Brother     also HTN, lupus, enlarged heart  . Heart disease Father     cardiac arrest  . Heart disease Maternal Grandfather     cardiac arrest  .  COPD Paternal Grandmother   . Diabetes Paternal Grandfather   . Heart disease Paternal Grandfather   . ADD / ADHD Child   . Asthma Child    Social History:  Social History   Social History  . Marital Status: Married    Spouse Name: N/A  . Number of Children: 2  . Years of Education: N/A   Occupational History  . 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 Smoking status: Current Every Day Smoker -- 1.00 packs/day for 27 years    Types: Cigarettes    Start date: 10/31/1986    Last Attempt to Quit: 11/22/2014  . Smokeless tobacco: Never Used  . Alcohol Use: No  . Drug Use: No  . Sexual Activity: Yes    Birth Control/  Protection: None   Other Topics Concern  . None   Social History Narrative   Patient lives at home with her mother.   Disabled   Right handed   Caffeine three cups daily   Some college education   Additional History:   Assessment:   Musculoskeletal:  Strength & Muscle Tone: within normal limits Gait & Station: normal Patient leans: N/A  Psychiatric Specialty Exam: Insomnia PMH includes: depression.  Depression        Associated symptoms include insomnia.  Associated symptoms include no suicidal ideas.  Past medical history includes anxiety.   Anxiety Symptoms include insomnia and nervous/anxious behavior. Patient reports no suicidal ideas.      Review of Systems  Psychiatric/Behavioral: Positive for depression. Negative for suicidal ideas, hallucinations, memory loss and substance abuse. The patient is nervous/anxious and has insomnia.   All other systems reviewed and are negative.   Blood pressure 122/84, pulse 103, temperature 97.8 F (36.6 C), temperature source Tympanic, height 5\' 6"  (1.676 m), weight 206 lb (93.441 kg), SpO2 95 %.Body mass index is 33.27 kg/(m^2).  General Appearance: Well Groomed  Eye Contact:  Good  Speech:  Normal Rate  Volume:  Normal  Mood:  okay  Affect:  Anxious, but bright and able to smile  Thought Process:  Linear  Orientation:  Full (Time, Place, and Person)  Thought Content:  Negative  Suicidal Thoughts:  No  Homicidal Thoughts:  No  Memory:  Immediate;   Good Recent;   Good Remote;   Good  Judgement:  Good  Insight:  Good  Psychomotor Activity:  Negative  Concentration:  Good  Recall:  Good  Fund of Knowledge: Good  Language: Good  Akathisia:  Negative  Handed:    AIMS (if indicated):  Done 12/05/14 normal  Assets:  Communication Skills Desire for Improvement Social Support  ADL's:  Intact  Cognition: WNL  Sleep:  good   Is the patient at risk to self?  No. Has the patient been a risk to self in the past 6 months?   No. Has the patient been a risk to self within the distant past?  No. Is the patient a risk to others?  No. Has the patient been a risk to others in the past 6 months?  No. Has the patient been a risk to others within the distant past?  No.  Current Medications: Current Outpatient Prescriptions  Medication Sig Dispense Refill  . albuterol (PROAIR HFA) 108 (90 BASE) MCG/ACT inhaler Inhale 2 puffs into the lungs every 4 (four) hours as needed. For shortness of breath. 18 g 2  .  albuterol (PROVENTIL) (2.5 MG/3ML) 0.083% nebulizer solution Take 3 mLs (2.5 mg total) by nebulization every 6 (six) hours as needed. For shortness of breath. 75 mL 0  . ARIPiprazole (ABILIFY) 30 MG tablet TAKE ONE (1) TABLET BY MOUTH EVERY DAY 30 tablet 3  . aspirin (BAYER ASPIRIN) 325 MG tablet Take 1 tablet (325 mg total) by mouth daily. 100 tablet 3  . budesonide-formoterol (SYMBICORT) 160-4.5 MCG/ACT inhaler Inhale 2 puffs into the lungs 2 (two) times daily. 1 Inhaler 2  . diazepam (VALIUM) 5 MG tablet Take 1 tablet (5 mg total) by mouth 2 (two) times daily as needed for anxiety. 60 tablet 4  . diclofenac sodium (VOLTAREN) 1 % GEL Apply 2-4 mg to painful area of skin 4 times per day if tolerated . Limit 4 grams per application [id number] g 2  . DULoxetine (CYMBALTA) 30 MG capsule Two capsules in the morning, and one capsule in the evening. 90 capsule 4  . eletriptan (RELPAX) 40 MG tablet Take 1 tablet (40 mg total) by mouth as needed for migraine or headache. May repeat in 2 hours if headache persists or recurs. 15 tablet 6  . HYDROcodone-acetaminophen (NORCO/VICODIN) 5-325 MG tablet     . Linaclotide (LINZESS) 145 MCG CAPS capsule Take 1-2 capsules (145-290 mcg total) by mouth daily as needed. 60 capsule 11  . lisdexamfetamine (VYVANSE) 30 MG capsule Take 1 capsule (30 mg total) by mouth daily. 30 capsule 0  . meloxicam (MOBIC) 15 MG tablet Take 1 tablet (15 mg total) by mouth daily as needed for pain. 30 tablet 5  .  metoprolol (LOPRESSOR) 100 MG tablet Take 1 tablet (100 mg total) by mouth 2 (two) times daily. 180 tablet 3  . nortriptyline (PAMELOR) 25 MG capsule Take 1 capsule by mouth at bedtime.    . orphenadrine (NORFLEX) 100 MG tablet Limit 1 tab by mouth per day or twice per day if tolerated 60 tablet 2  . pantoprazole (PROTONIX) 40 MG tablet Take 1 tablet (40 mg total) by mouth daily. 90 tablet 2  . pregabalin (LYRICA) 50 MG capsule Take 1 capsule (50 mg total) by mouth 3 (three) times daily. 90 capsule 1  . ranitidine (ZANTAC) 300 MG tablet Take 1 tablet (300 mg total) by mouth at bedtime. 90 tablet 3  . traMADol (ULTRAM) 50 MG tablet Limit 1 tablet by mouth 4 - 8  times per day if tolerated 240 tablet 0  . Vitamin D, Ergocalciferol, (DRISDOL) 50000 UNITS CAPS capsule Take 1 capsule (50,000 Units total) by mouth every 7 (seven) days. 8 capsule 0  . zolpidem (AMBIEN) 10 MG tablet Take 1 tablet (10 mg total) by mouth at bedtime as needed for sleep. 30 tablet 4   Current Facility-Administered Medications  Medication Dose Route Frequency Provider Last Rate Last Dose  . bupivacaine (MARCAINE) 0.5 % injection 30 mL  30 mL Other Once Mohammed Kindle, MD      . bupivacaine (PF) (MARCAINE) 0.25 % injection 30 mL  30 mL Other Once Mohammed Kindle, MD      . bupivacaine (PF) (MARCAINE) 0.25 % injection 30 mL  30 mL Other Once Mohammed Kindle, MD      . fentaNYL (SUBLIMAZE) injection 100 mcg  100 mcg Intravenous Once Mohammed Kindle, MD      . fentaNYL (SUBLIMAZE) injection 100 mcg  100 mcg Intravenous Once Mohammed Kindle, MD      . lactated ringers infusion 1,000 mL  1,000 mL Intravenous Continuous Mohammed Kindle,  MD      . lactated ringers infusion 1,000 mL  1,000 mL Intravenous Continuous Mohammed Kindle, MD      . lactated ringers infusion 1,000 mL  1,000 mL Intravenous Continuous Mohammed Kindle, MD      . lactated ringers infusion 1,000 mL  1,000 mL Intravenous Continuous Mohammed Kindle, MD      . lidocaine (PF)  (XYLOCAINE) 1 % injection 10 mL  10 mL Subcutaneous Once Mohammed Kindle, MD      . midazolam (VERSED) 5 MG/5ML injection 5 mg  5 mg Intravenous Once Mohammed Kindle, MD      . midazolam (VERSED) 5 MG/5ML injection 5 mg  5 mg Intravenous Once Mohammed Kindle, MD      . orphenadrine (NORFLEX) injection 60 mg  60 mg Intramuscular Once Mohammed Kindle, MD      . orphenadrine (NORFLEX) injection 60 mg  60 mg Intramuscular Once Mohammed Kindle, MD      . triamcinolone acetonide (KENALOG-40) injection 40 mg  40 mg Other Once Mohammed Kindle, MD      . triamcinolone acetonide (KENALOG-40) injection 40 mg  40 mg Other Once Mohammed Kindle, MD      . triamcinolone acetonide (KENALOG-40) injection 40 mg  40 mg Other Once Mohammed Kindle, MD        Medical Decision Making:  Established Problem, Stable/Improving (1) and Review of Medication Regimen & Side Effects (2)  Bipolar disorder type II-we will continue her Abilify at 30 mg daily. Continue Cymbalta 60 mg in the morning and 30 mg in the evening.  Generalized anxiety Disorder- Same as above. Discussed with patient to continue the Valium at 5 mg once daily for 2 weeks and then reduce to 2.5 mg daily and then stop.  ADHD-recommend that patient stop the Vyvanse to help with her sleep. Patient is not interested in doing this.  Insomnia-Ambien 10 mg at bedtime as needed for insomnia. Risk and benefits discussed and patient able to consent.  Discussed with patient that she is on very high dosages of her current medications and we will need to take a look at the combination of her medications. Educated her about how Vyvanse could be interfering with her sleep and to stop it to help her sleep and that we will reevaluate this in a month's time. However patient is not interested in doing this. In addition to this above combination of psychiatric medications patient also takes hydrocodone and tramadol throughout the day.  Patient did not make a follow-up appointment and she  signed a release to transfer her medical records elsewhere to a different clinician. We will close her chart if she does not follow-up   [name] 05/02/2015, 2:32 PM

## 2015-05-04 ENCOUNTER — Encounter: Payer: Self-pay | Admitting: Pain Medicine

## 2015-05-04 ENCOUNTER — Ambulatory Visit: Payer: BLUE CROSS/BLUE SHIELD | Attending: Pain Medicine | Admitting: Pain Medicine

## 2015-05-04 VITALS — BP 137/89 | HR 115 | Temp 98.2°F | Resp 16 | Ht 66.0 in | Wt 206.0 lb

## 2015-05-04 DIAGNOSIS — M545 Low back pain: Secondary | ICD-10-CM | POA: Diagnosis not present

## 2015-05-04 DIAGNOSIS — M62830 Muscle spasm of back: Secondary | ICD-10-CM | POA: Diagnosis not present

## 2015-05-04 DIAGNOSIS — M172 Bilateral post-traumatic osteoarthritis of knee: Secondary | ICD-10-CM

## 2015-05-04 DIAGNOSIS — M47816 Spondylosis without myelopathy or radiculopathy, lumbar region: Secondary | ICD-10-CM | POA: Diagnosis not present

## 2015-05-04 DIAGNOSIS — M47894 Other spondylosis, thoracic region: Secondary | ICD-10-CM

## 2015-05-04 DIAGNOSIS — M546 Pain in thoracic spine: Secondary | ICD-10-CM | POA: Diagnosis present

## 2015-05-04 DIAGNOSIS — M5137 Other intervertebral disc degeneration, lumbosacral region: Secondary | ICD-10-CM

## 2015-05-04 DIAGNOSIS — M4804 Spinal stenosis, thoracic region: Secondary | ICD-10-CM | POA: Diagnosis not present

## 2015-05-04 DIAGNOSIS — M79604 Pain in right leg: Secondary | ICD-10-CM

## 2015-05-04 DIAGNOSIS — M1611 Unilateral primary osteoarthritis, right hip: Secondary | ICD-10-CM | POA: Diagnosis not present

## 2015-05-04 DIAGNOSIS — M47814 Spondylosis without myelopathy or radiculopathy, thoracic region: Secondary | ICD-10-CM | POA: Insufficient documentation

## 2015-05-04 DIAGNOSIS — M5136 Other intervertebral disc degeneration, lumbar region: Secondary | ICD-10-CM

## 2015-05-04 DIAGNOSIS — G588 Other specified mononeuropathies: Secondary | ICD-10-CM | POA: Diagnosis not present

## 2015-05-04 DIAGNOSIS — M5134 Other intervertebral disc degeneration, thoracic region: Secondary | ICD-10-CM | POA: Insufficient documentation

## 2015-05-04 DIAGNOSIS — M5459 Other low back pain: Secondary | ICD-10-CM

## 2015-05-04 DIAGNOSIS — M5481 Occipital neuralgia: Secondary | ICD-10-CM

## 2015-05-04 DIAGNOSIS — M170 Bilateral primary osteoarthritis of knee: Secondary | ICD-10-CM | POA: Insufficient documentation

## 2015-05-04 DIAGNOSIS — M79606 Pain in leg, unspecified: Secondary | ICD-10-CM | POA: Diagnosis present

## 2015-05-04 DIAGNOSIS — M79605 Pain in left leg: Secondary | ICD-10-CM

## 2015-05-04 MED ORDER — ORPHENADRINE CITRATE ER 100 MG PO TB12: ORAL_TABLET | ORAL | Status: DC

## 2015-05-04 MED ORDER — HYDROCODONE-ACETAMINOPHEN 5-325 MG PO TABS: ORAL_TABLET | ORAL | Status: DC

## 2015-05-04 MED ORDER — TRAMADOL HCL 50 MG PO TABS: ORAL_TABLET | ORAL | Status: DC

## 2015-05-04 NOTE — Patient Instructions (Addendum)
PLAN   Continue present medication Norflex hydrocodone acetaminophen diclofenac gel and tramadol  F/U PCP  Plotnikov for evaliation of  BP and general medical  condition  F/U surgical evaluation. Follow-up Dr.Daldorf  and neurosurgical eval as planned Follow-up Dr.Dumonski as needed  F/U neurological evaluation. May consider pending follow-up evaluations  May consider radiofrequency rhizolysis or intraspinal procedures pending response to present treatment and F/U evaluation   Patient to call [facility] Management Center should patient have concerns prior to scheduled return appointment  Tramadol and norco prescription given.

## 2015-05-04 NOTE — Progress Notes (Signed)
Safety precautions to be maintained throughout the outpatient stay will include: orient to surroundings, keep bed in low position, maintain call bell within reach at all times, provide assistance with transfer out of bed and ambulation.  

## 2015-05-04 NOTE — Progress Notes (Signed)
Subjective:    Patient ID: [name], female    DOB: 1970/08/21, 45 y.o.   MRN: [id number]  HPI  Patient is a 45 year old female who returns to pain management for further evaluation and treatment of pain involving the upper mid lower back lower extremity region. The patient also states she has pain involving the knees and that the pain of the lower back mid back region associated with significant muscle spasms. The patient admits to improvement of pain with interventional treatment in pain management Center consisting of intercostal nerve blocks and facet injection, medial branch nerve blocks. Patient also admits to pain involving the knees with degenerative changes of the knee felt to be contributing to patient's pain of significant degree. We will continue present medications at this time and will avoid interventional treatment. The patient is caring for her mother who is terminally ill    Review of Systems     Objective:   Physical Exam   There was tenderness to palpation of paraspinal muscular region cervical region cervical facet region palpation which be produced pain of mild degree with mild tenderness over the acromioclavicular and glenohumeral joint region. Patient was at unremarkable Spurling's maneuver palpation of the upper and mid thoracic region was with evidence of muscle spasm moderate degree with palpation over the lower thoracic paraspinal must reason reproducing moderate to moderately severe discomfort. No crepitus of the thoracic region was noted. Patient appeared to be with bilaterally equal grip strength and Tinel and Phalen's maneuver were without increase of pain of significant degree. Palpation over the thoracic region thoracic facet region was attends to palpation of moderate degree in the upper and mid thoracic regions and moderately severe degree in the lower thoracic paraspinal musculature region. Palpation over the lumbar paraspinal musculatures and lumbar  facet region was associated with increased pain with lateral bending rotation extension and palpation of the lumbar facets reproducing pain of moderate degree. Straight leg raising was tolerates approximately 30 without increased pain with dorsiflexion noted. The knees were attends to palpation with well-healed surgical scars of the knees without increased warmth or erythema of the knees with negative anterior and posterior drawer signs without ballottement of the patella. There was crepitus of the knees with increased pain with range of motion maneuvers of the knee. There was negative clonus negative Homans abdomen nontender with no costovertebral tenderness noted     Assessment & Plan:     Degenerative disc disease of the thoracic spine 1. Stable thoracic spine since 2014. No acute osseous abnormality. 2. Mild disc degeneration most pronounced at T10-T11 contributing to mild spinal stenosis at that level. Stable minimal cord mass effect with no cord signal abnormality. 3. 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 May consider pending follow-up evaluations  F/U neurological evaluation. May consider PNCV/EMG studies and other studies pending follow-up evaluations  Continue exercising and massage as we discussed  May consider radiofrequency rhizolysis or intraspinal procedures pending response to present treatment and F/U evaluation . We will avoid such procedures  at this time  Patient  to call Pain Management Center should patient have concerns prior to scheduled return appointment

## 2015-05-05 ENCOUNTER — Encounter: Payer: Self-pay | Admitting: Internal Medicine

## 2015-05-05 ENCOUNTER — Other Ambulatory Visit (INDEPENDENT_AMBULATORY_CARE_PROVIDER_SITE_OTHER): Payer: BLUE CROSS/BLUE SHIELD

## 2015-05-05 ENCOUNTER — Ambulatory Visit (INDEPENDENT_AMBULATORY_CARE_PROVIDER_SITE_OTHER): Payer: BLUE CROSS/BLUE SHIELD | Admitting: Internal Medicine

## 2015-05-05 VITALS — BP 126/88 | HR 77 | Temp 97.8°F | Wt 204.0 lb

## 2015-05-05 DIAGNOSIS — F411 Generalized anxiety disorder: Secondary | ICD-10-CM | POA: Diagnosis not present

## 2015-05-05 DIAGNOSIS — I10 Essential (primary) hypertension: Secondary | ICD-10-CM

## 2015-05-05 DIAGNOSIS — J4520 Mild intermittent asthma, uncomplicated: Secondary | ICD-10-CM

## 2015-05-05 DIAGNOSIS — D751 Secondary polycythemia: Secondary | ICD-10-CM

## 2015-05-05 DIAGNOSIS — R739 Hyperglycemia, unspecified: Secondary | ICD-10-CM

## 2015-05-05 DIAGNOSIS — F5102 Adjustment insomnia: Secondary | ICD-10-CM | POA: Diagnosis not present

## 2015-05-05 DIAGNOSIS — E559 Vitamin D deficiency, unspecified: Secondary | ICD-10-CM

## 2015-05-05 LAB — CBC WITH DIFFERENTIAL/PLATELET
BASOS ABS: 0.1 10*3/uL (ref 0.0–0.1)
Basophils Relative: 0.5 % (ref 0.0–3.0)
Eosinophils Absolute: 0.4 10*3/uL (ref 0.0–0.7)
Eosinophils Relative: 3.8 % (ref 0.0–5.0)
HEMATOCRIT: 46.9 % — AB (ref 36.0–46.0)
HEMOGLOBIN: 16.3 g/dL — AB (ref 12.0–15.0)
LYMPHS ABS: 3.4 10*3/uL (ref 0.7–4.0)
LYMPHS PCT: 31.7 % (ref 12.0–46.0)
MCHC: 34.7 g/dL (ref 30.0–36.0)
MCV: 91.5 fl (ref 78.0–100.0)
MONOS PCT: 12.7 % — AB (ref 3.0–12.0)
Monocytes Absolute: 1.4 10*3/uL — ABNORMAL HIGH (ref 0.1–1.0)
NEUTROS PCT: 51.3 % (ref 43.0–77.0)
Neutro Abs: 5.5 10*3/uL (ref 1.4–7.7)
Platelets: 271 10*3/uL (ref 150.0–400.0)
RBC: 5.12 Mil/uL — AB (ref 3.87–5.11)
RDW: 12.8 % (ref 11.5–15.5)
WBC: 10.7 10*3/uL — AB (ref 4.0–10.5)

## 2015-05-05 LAB — BASIC METABOLIC PANEL
BUN: 12 mg/dL (ref 6–23)
CHLORIDE: 100 meq/L (ref 96–112)
CO2: 30 meq/L (ref 19–32)
CREATININE: 0.71 mg/dL (ref 0.40–1.20)
Calcium: 10.3 mg/dL (ref 8.4–10.5)
GFR: 95.75 mL/min (ref >60.00)
GLUCOSE: 82 mg/dL (ref 70–99)
Potassium: 4.2 mEq/L (ref 3.5–5.1)
Sodium: 138 mEq/L (ref 135–145)

## 2015-05-05 LAB — HEMOGLOBIN A1C: Hgb A1c MFr Bld: 5.7 % (ref 4.6–6.5)

## 2015-05-05 MED ORDER — SUVOREXANT 15 MG PO TABS: 15.0000 mg | ORAL_TABLET | Freq: Every evening | ORAL | Status: DC | PRN

## 2015-05-05 MED ORDER — DIAZEPAM 5 MG PO TABS: 5.0000 mg | ORAL_TABLET | Freq: Two times a day (BID) | ORAL | Status: DC | PRN

## 2015-05-05 NOTE — Progress Notes (Signed)
Pre visit review using our clinic review tool, if applicable. No additional management support is needed unless otherwise documented below in the visit note. 

## 2015-05-05 NOTE — Assessment & Plan Note (Signed)
On vit D 

## 2015-05-05 NOTE — Progress Notes (Signed)
Subjective:  Patient ID: [name], female    DOB: 08-12-1970  Age: 45 y.o. MRN: [id number]  CC: No chief complaint on file.   HPI [name] presents for stress w/ill mother (breast cancer). C/o insomnia - Zolpidem is not working. F/u asthma, bipolar 1 disorder           Outpatient Prescriptions Prior to Visit  Medication Sig Dispense Refill  . albuterol (PROAIR HFA) 108 (90 BASE) MCG/ACT inhaler Inhale 2 puffs into the lungs every 4 (four) hours as needed. For shortness of breath. 18 g 2  . albuterol (PROVENTIL) (2.5 MG/3ML) 0.083% nebulizer solution Take 3 mLs (2.5 mg total) by nebulization every 6 (six) hours as needed. For shortness of breath. (Patient not taking: Reported on 05/04/2015) 75 mL 0  . ARIPiprazole (ABILIFY) 30 MG tablet TAKE ONE (1) TABLET BY MOUTH EVERY DAY 30 tablet 3  . aspirin (BAYER ASPIRIN) 325 MG tablet Take 1 tablet (325 mg total) by mouth daily. 100 tablet 3  . budesonide-formoterol (SYMBICORT) 160-4.5 MCG/ACT inhaler Inhale 2 puffs into the lungs 2 (two) times daily. 1 Inhaler 2  . diazepam (VALIUM) 5 MG tablet Take 1 tablet (5 mg total) by mouth 2 (two) times daily as needed for anxiety. 60 tablet 4  . diclofenac sodium (VOLTAREN) 1 % GEL Apply 2-4 mg to painful area of skin 4 times per day if tolerated . Limit 4 grams per application (Patient not taking: Reported on 05/05/2015) 500 g 2  . DULoxetine (CYMBALTA) 30 MG capsule Two capsules in the morning, and one capsule in the evening. 90 capsule 4  . eletriptan (RELPAX) 40 MG tablet Take 1 tablet (40 mg total) by mouth as needed for migraine or headache. May repeat in 2 hours if headache persists or recurs. 15 tablet 6  . HYDROcodone-acetaminophen (NORCO/VICODIN) 5-325 MG tablet Limit one half to one tablet by mouth per day if tolerated for breakthrough pain while taking tramadol 30 tablet 0  . Linaclotide (LINZESS) 145 MCG CAPS capsule Take 1-2 capsules (145-290 mcg total) by mouth daily as needed.  60 capsule 11  . lisdexamfetamine (VYVANSE) 30 MG capsule Take 1 capsule (30 mg total) by mouth daily. 30 capsule 0  . meloxicam (MOBIC) 15 MG tablet Take 1 tablet (15 mg total) by mouth daily as needed for pain. 30 tablet 5  . metoprolol (LOPRESSOR) 100 MG tablet Take 1 tablet (100 mg total) by mouth 2 (two) times daily. 180 tablet 3  . nortriptyline (PAMELOR) 25 MG capsule Take 1 capsule by mouth at bedtime.    . orphenadrine (NORFLEX) 100 MG tablet Limit 1 tab by mouth per day or twice per day if tolerated 60 tablet 2  . pantoprazole (PROTONIX) 40 MG tablet Take 1 tablet (40 mg total) by mouth daily. 90 tablet 2  . pregabalin (LYRICA) 50 MG capsule Take 1 capsule (50 mg total) by mouth 3 (three) times daily. 90 capsule 1  . ranitidine (ZANTAC) 300 MG tablet Take 1 tablet (300 mg total) by mouth at bedtime. (Patient not taking: Reported on 05/05/2015) 90 tablet 3  . traMADol (ULTRAM) 50 MG tablet Limit 1 tablet by mouth 4 - 8  times per day if tolerated 240 tablet 0  . Vitamin D, Ergocalciferol, (DRISDOL) 50000 UNITS CAPS capsule Take 1 capsule (50,000 Units total) by mouth every 7 (seven) days. (Patient not taking: Reported on 05/05/2015) 8 capsule 0  . zolpidem (AMBIEN) 10 MG tablet Take 1 tablet (10  mg total) by mouth at bedtime as needed for sleep. 30 tablet 4   Facility-Administered Medications Prior to Visit  Medication Dose Route Frequency Provider Last Rate Last Dose  . bupivacaine (MARCAINE) 0.5 % injection 30 mL  30 mL Other Once Mohammed Kindle, MD      . bupivacaine (PF) (MARCAINE) 0.25 % injection 30 mL  30 mL Other Once Mohammed Kindle, MD      . bupivacaine (PF) (MARCAINE) 0.25 % injection 30 mL  30 mL Other Once Mohammed Kindle, MD      . fentaNYL (SUBLIMAZE) injection 100 mcg  100 mcg Intravenous Once Mohammed Kindle, MD      . fentaNYL (SUBLIMAZE) injection 100 mcg  100 mcg Intravenous Once Mohammed Kindle, MD      . lactated ringers infusion 1,000 mL  1,000 mL Intravenous Continuous  Mohammed Kindle, MD      . lactated ringers infusion 1,000 mL  1,000 mL Intravenous Continuous Mohammed Kindle, MD      . lactated ringers infusion 1,000 mL  1,000 mL Intravenous Continuous Mohammed Kindle, MD      . lactated ringers infusion 1,000 mL  1,000 mL Intravenous Continuous Mohammed Kindle, MD      . lidocaine (PF) (XYLOCAINE) 1 % injection 10 mL  10 mL Subcutaneous Once Mohammed Kindle, MD      . midazolam (VERSED) 5 MG/5ML injection 5 mg  5 mg Intravenous Once Mohammed Kindle, MD      . midazolam (VERSED) 5 MG/5ML injection 5 mg  5 mg Intravenous Once Mohammed Kindle, MD      . orphenadrine (NORFLEX) injection 60 mg  60 mg Intramuscular Once Mohammed Kindle, MD      . orphenadrine (NORFLEX) injection 60 mg  60 mg Intramuscular Once Mohammed Kindle, MD      . triamcinolone acetonide (KENALOG-40) injection 40 mg  40 mg Other Once Mohammed Kindle, MD      . triamcinolone acetonide (KENALOG-40) injection 40 mg  40 mg Other Once Mohammed Kindle, MD      . triamcinolone acetonide (KENALOG-40) injection 40 mg  40 mg Other Once Mohammed Kindle, MD        ROS Review of Systems  Constitutional: Negative for chills, activity change, appetite change, fatigue and unexpected weight change.  HENT: Negative for congestion, mouth sores and sinus pressure.   Eyes: Negative for visual disturbance.  Respiratory: Negative for cough and chest tightness.   Gastrointestinal: Negative for nausea and abdominal pain.  Genitourinary: Negative for frequency, difficulty urinating and vaginal pain.  Musculoskeletal: Negative for back pain and gait problem.  Skin: Negative for pallor and rash.  Neurological: Negative for dizziness, tremors, weakness, numbness and headaches.  Psychiatric/Behavioral: Positive for sleep disturbance. Negative for confusion. The patient is nervous/anxious.     Objective:  BP 126/88 mmHg  Pulse 77  Temp(Src) 97.8 F (36.6 C)  Wt 204 lb (92.534 kg)  SpO2 98%  BP Readings from Last 3 Encounters:    05/05/15 126/88  05/04/15 137/89  05/02/15 122/84    Wt Readings from Last 3 Encounters:  05/05/15 204 lb (92.534 kg)  05/04/15 206 lb (93.441 kg)  05/02/15 206 lb (93.441 kg)    Physical Exam  Lab Results  Component Value Date   WBC 8.6 12/30/2014   HGB 16.2* 12/30/2014   HCT 48.6* 12/30/2014   PLT 269.0 12/30/2014   GLUCOSE 99 12/30/2014   CHOL 216* 12/30/2014   TRIG 164.0* 12/30/2014   HDL 37.50* 12/30/2014  LDLDIRECT 161.1 02/18/2013   LDLCALC 145* 12/30/2014   ALT 18 09/06/2014   AST 15 09/06/2014   NA 137 12/30/2014   K 4.6 12/30/2014   CL 101 12/30/2014   CREATININE 0.71 12/30/2014   BUN 13 12/30/2014   CO2 29 12/30/2014   TSH 2.45 12/30/2014   INR 1.05 08/29/2014   HGBA1C 6.0 12/30/2014    Mr Thoracic Spine Wo Contrast  01/12/2015  CLINICAL DATA:  45 year old female with thoracic back pain since 2013, shooting pain radiating to the right side. Subsequent encounter. EXAM: MRI THORACIC SPINE WITHOUT CONTRAST TECHNIQUE: Multiplanar, multisequence MR imaging of the thoracic spine was performed. No intravenous contrast was administered. COMPARISON:  Thoracic spine radiographs 02/09/2013. Thoracic spine MRI 04/09/2012. Cervical spine CT 02/09/2013. FINDINGS: Limited sagittal imaging of the cervical spine appears stable since 2015. Thoracic vertebral height and alignment appear stable since 2014. There are small chronic endplate Schmorl nodes in the lower thoracic spine. No marrow edema or evidence of acute osseous abnormality. Negative visualized posterior paraspinal soft tissues. Negative visualized thoracic and upper abdominal viscera. T1-T2: Stable mild facet hypertrophy.  No stenosis. T2-T3: Stable mild facet hypertrophy greater on the left. Mild left T2 foraminal stenosis is stable. T3-T4: Stable mild facet and ligament flavum hypertrophy, no stenosis. T4-T5: Stable mild ligament flavum hypertrophy, no stenosis. T5-T6: Stable mild facet and ligament flavum  hypertrophy. No stenosis. T6-T7: Chronic disc space loss, but minimal disc bulging. Mild facet and ligament flavum hypertrophy is stable. No stenosis. T7-T8: Chronic disc space loss but minimal disc bulge. Mild facet hypertrophy is stable. No stenosis. T8-T9: Chronic disc space loss with minimal to mild disc bulge. Mild facet hypertrophy is stable to regressed. No stenosis. T9-T10: Stable mild facet hypertrophy.  No stenosis. T10-T11: Chronic broad-based posterior disc bulge appears stable. Mild to moderate facet hypertrophy is chronic and stable. Mild spinal stenosis with slight flattening of the ventral cord has not significantly changed since 2014. No foraminal stenosis. T11-T12: Stable mild disc bulge with broad-based posterior component. Stable mild facet hypertrophy. No stenosis. T12-L1:  Mild disc bulge.  No stenosis. No thoracic spinal cord signal abnormality. No cord mass effect other than that at T10-T11. Conus medullaris mostly visible at L1 and appears normal. IMPRESSION: 1. Stable thoracic spine since 2014.   No acute osseous abnormality. 2. Mild disc degeneration most pronounced at T10-T11 contributing to mild spinal stenosis at that level. Stable minimal cord mass effect with no cord signal abnormality. 3. More widespread facet and ligament flavum hypertrophy also appears stable. Associated mild left T2 foraminal stenosis has not significantly changed. Electronically Signed   By: Genevie Ann M.D.   On: 01/12/2015 11:13   Mr Lumbar Spine Wo Contrast  01/12/2015  CLINICAL DATA:  Low back pain since 2013.  No known injury. EXAM: MRI LUMBAR SPINE WITHOUT CONTRAST TECHNIQUE: Multiplanar, multisequence MR imaging of the lumbar spine was performed. No intravenous contrast was administered. COMPARISON:  None. FINDINGS: The vertebral bodies of the lumbar spine are normal in size. The vertebral bodies of the lumbar spine are normal in alignment. There is normal bone marrow signal demonstrated throughout the  vertebra. The intervertebral disc spaces are well-maintained. There is disc desiccation at L5-S1. The spinal cord is normal in signal and contour. The cord terminates normally at L1 . The nerve roots of the cauda equina and the filum terminale are normal. The visualized portions of the SI joints are unremarkable. The imaged intra-abdominal contents are unremarkable. T12-L1: Mild broad-based disc bulge.  No evidence of neural foraminal stenosis. No central canal stenosis. L1-L2: Mild broad-based disc bulge. Mild bilateral facet arthropathy. No evidence of neural foraminal stenosis. No central canal stenosis. L2-L3: No significant disc bulge. No evidence of neural foraminal stenosis. No central canal stenosis. Mild bilateral facet arthropathy. L3-L4: No significant disc bulge. No evidence of neural foraminal stenosis. No central canal stenosis. Mild bilateral facet arthropathy. L4-L5: No significant disc bulge. No evidence of neural foraminal stenosis. No central canal stenosis. Moderate bilateral facet arthropathy. L5-S1: Small central disc protrusion. Mild bilateral facet arthropathy. No evidence of neural foraminal stenosis. No central canal stenosis. IMPRESSION: 1. Mild lumbar spine spondylosis as described above. 2. Moderate bilateral facet arthropathy at L4-5. 3. Small central disc protrusion at L5-S1 with mild bilateral facet arthropathy. Electronically Signed   By: Kathreen Devoid   On: 01/12/2015 11:08    Assessment & Plan:   There are no diagnoses linked to this encounter. I am having Ms. Sanagustin maintain her aspirin, ranitidine, budesonide-formoterol, albuterol, albuterol, Vitamin D (Ergocalciferol), eletriptan, Linaclotide, pregabalin, meloxicam, metoprolol, nortriptyline, zolpidem, diazepam, DULoxetine, lisdexamfetamine, ARIPiprazole, diclofenac sodium, pantoprazole, traMADol, orphenadrine, and HYDROcodone-acetaminophen. We will continue to administer bupivacaine (PF), orphenadrine, triamcinolone  acetonide, bupivacaine, fentaNYL, lactated ringers, midazolam, orphenadrine, triamcinolone acetonide, bupivacaine (PF), fentaNYL, lactated ringers, lidocaine (PF), triamcinolone acetonide, lactated ringers, lactated ringers, and midazolam.  No orders of the defined types were placed in this encounter.     Follow-up: No Follow-up on file.  Walker Kehr, MD

## 2015-05-05 NOTE — Assessment & Plan Note (Signed)
Phlebotomy prn for Hct>45% 

## 2015-05-05 NOTE — Assessment & Plan Note (Signed)
4/17 pt will start seeingDr A Kapur (Psychiatry) 4/17 start Belsomra  Potential benefits of a long term Belsomra  use as well as potential risks  and complications were explained to the patient and were aknowledged. Other drugs interactions aknowledged

## 2015-05-05 NOTE — Assessment & Plan Note (Signed)
On Metoprolol 

## 2015-05-05 NOTE — Assessment & Plan Note (Signed)
Diazepam prn 4/17 pt will start seeingDr A Kapur (Psychiatry) 4/17 start Belsomra  Potential benefits of a long term Belsomra  use as well as potential risks  and complications were explained to the patient and were aknowledged. Other drugs interactions aknowledged

## 2015-05-05 NOTE — Assessment & Plan Note (Signed)
Proair prn

## 2015-05-09 ENCOUNTER — Other Ambulatory Visit: Payer: Self-pay | Admitting: Internal Medicine

## 2015-05-11 LAB — TOXASSURE SELECT 13 (MW), URINE: PDF: 0

## 2015-05-15 DIAGNOSIS — M25561 Pain in right knee: Secondary | ICD-10-CM | POA: Diagnosis not present

## 2015-05-15 DIAGNOSIS — M25562 Pain in left knee: Secondary | ICD-10-CM | POA: Diagnosis not present

## 2015-05-15 NOTE — Telephone Encounter (Signed)
Done

## 2015-05-16 ENCOUNTER — Ambulatory Visit
Admission: RE | Admit: 2015-05-16 | Discharge: 2015-05-16 | Disposition: A | Discharge status: Home / Self Care | Payer: BLUE CROSS/BLUE SHIELD | Source: Ambulatory Visit

## 2015-05-16 DIAGNOSIS — Z1231 Encounter for screening mammogram for malignant neoplasm of breast: Secondary | ICD-10-CM

## 2015-05-17 ENCOUNTER — Ambulatory Visit (INDEPENDENT_AMBULATORY_CARE_PROVIDER_SITE_OTHER): Payer: BLUE CROSS/BLUE SHIELD | Admitting: Internal Medicine

## 2015-05-17 ENCOUNTER — Encounter: Payer: Self-pay | Admitting: Internal Medicine

## 2015-05-17 VITALS — BP 140/80 | HR 88 | Temp 98.5°F | Resp 20 | Wt 205.0 lb

## 2015-05-17 DIAGNOSIS — R739 Hyperglycemia, unspecified: Secondary | ICD-10-CM

## 2015-05-17 DIAGNOSIS — J069 Acute upper respiratory infection, unspecified: Secondary | ICD-10-CM | POA: Diagnosis not present

## 2015-05-17 DIAGNOSIS — I10 Essential (primary) hypertension: Secondary | ICD-10-CM | POA: Diagnosis not present

## 2015-05-17 DIAGNOSIS — J4520 Mild intermittent asthma, uncomplicated: Secondary | ICD-10-CM | POA: Diagnosis not present

## 2015-05-17 MED ORDER — SULFAMETHOXAZOLE-TRIMETHOPRIM 800-160 MG PO TABS: 1.0000 | ORAL_TABLET | Freq: Two times a day (BID) | ORAL | Status: DC

## 2015-05-17 MED ORDER — FLUCONAZOLE 150 MG PO TABS: ORAL_TABLET | ORAL | Status: DC

## 2015-05-17 NOTE — Progress Notes (Signed)
Subjective:    Patient ID: [name], female    DOB: 1970/02/19, 45 y.o.   MRN: [id number]  HPI   Here with 2-3 days acute onset fever, facial pain, pressure, headache, general weakness and malaise, and greenish d/c, with mild ST and cough, but pt denies chest pain, wheezing, increased sob or doe, orthopnea, PND, increased LE swelling, palpitations, dizziness or syncope. Also has right neck pain and swelling after onset ST with pain to right post neck.  Pt denies new neurological symptoms such as new headache, or facial or extremity weakness or numbness   Pt denies polydipsia, polyuria.   Past Medical History  Diagnosis Date  . Attention deficit disorder without mention of hyperactivity   . Anxiety state, unspecified   . Pain in joint, site unspecified   . Unspecified asthma(493.90)   . Migraine without aura, without mention of intractable migraine without mention of status migrainosus   . Depressive disorder, not elsewhere classified   . Unspecified essential hypertension   . Unspecified vitamin D deficiency   . Hypertension   . Esophageal reflux    Past Surgical History  Procedure Laterality Date  . Partial hysterectomy  2007  . Tonsillectomy  04/29/00  . Incise and drain abcess  2011    Right axilla- Dr. Barkley Bruns  . Abdominal ultrasound  10/12/97  . Plantar fascia surgery  2000  . Transthoracic echocardiogram  11/2009    EF=>55%; trace MR & TR  . Nm myocar perf wall motion  11/2009    bruce myoview; perfusion defect in anterior region consistent with breast attenuation; remaining myocardium with normal perfusion; post-stress EF 74%; low risk scan   . Appendectomy  05/27/2013    gangrenous  . Abdominal hysterectomy    . Tonsillectomy Bilateral   . Knee arthroscopy      both knees    reports that she has been smoking Cigarettes.  She started smoking about 28 years ago. She has a 27 pack-year smoking history. She has never used smokeless tobacco. She reports that she does  not drink alcohol or use illicit drugs. family history includes ADD / ADHD in her child; Alcohol abuse in her brother and sister; Asthma in her child; Bipolar disorder in her sister; Breast cancer in her mother; COPD in her paternal grandmother; Cancer (age of onset: 44) in her mother; Depression in her mother; Diabetes in her paternal grandfather; Heart disease in her father, maternal grandfather, and paternal grandfather; Lupus in her sister; Multiple sclerosis in her mother. Allergies  Allergen Reactions  . Amoxicillin Itching    REACTION: QUESTIONABLE  . Ancef [Cefazolin] Itching  . Doxycycline Nausea And Vomiting    REACTION: vomiting  . Hydrochlorothiazide     REACTION: CRAMPS AT HIGHER DOSAGES  . Lisinopril     Other reaction(s): Cough (finding) REACTION: COUGH  . Penicillins   . Percocet [Oxycodone-Acetaminophen] Itching    itching  . Talwin [Pentazocine] Nausea And Vomiting    n/v   Current Outpatient Prescriptions on File Prior to Visit  Medication Sig Dispense Refill  . albuterol (PROAIR HFA) 108 (90 BASE) MCG/ACT inhaler Inhale 2 puffs into the lungs every 4 (four) hours as needed. For shortness of breath. 18 g 2  . albuterol (PROVENTIL) (2.5 MG/3ML) 0.083% nebulizer solution Take 3 mLs (2.5 mg total) by nebulization every 6 (six) hours as needed. For shortness of breath. 75 mL 0  . ARIPiprazole (ABILIFY) 30 MG tablet TAKE ONE (1) TABLET BY MOUTH EVERY DAY  30 tablet 3  . aspirin (BAYER ASPIRIN) 325 MG tablet Take 1 tablet (325 mg total) by mouth daily. 100 tablet 3  . budesonide-formoterol (SYMBICORT) 160-4.5 MCG/ACT inhaler Inhale 2 puffs into the lungs 2 (two) times daily. 1 Inhaler 2  . diazepam (VALIUM) 5 MG tablet Take 1 tablet (5 mg total) by mouth 2 (two) times daily as needed for anxiety. 60 tablet 2  . diclofenac sodium (VOLTAREN) 1 % GEL Apply 2-4 mg to painful area of skin 4 times per day if tolerated . Limit 4 grams per application [id number] g 2  . DULoxetine (CYMBALTA)  30 MG capsule Two capsules in the morning, and one capsule in the evening. 90 capsule 4  . eletriptan (RELPAX) 40 MG tablet Take 1 tablet (40 mg total) by mouth as needed for migraine or headache. May repeat in 2 hours if headache persists or recurs. 15 tablet 6  . HYDROcodone-acetaminophen (NORCO/VICODIN) 5-325 MG tablet Limit one half to one tablet by mouth per day if tolerated for breakthrough pain while taking tramadol 30 tablet 0  . Linaclotide (LINZESS) 145 MCG CAPS capsule Take 1-2 capsules (145-290 mcg total) by mouth daily as needed. 60 capsule 11  . lisdexamfetamine (VYVANSE) 30 MG capsule Take 1 capsule (30 mg total) by mouth daily. 30 capsule 0  . LYRICA 50 MG capsule TAKE 1 CAPSULE 3 TIMES DAILY 90 capsule 1  . meloxicam (MOBIC) 15 MG tablet Take 1 tablet (15 mg total) by mouth daily as needed for pain. 30 tablet 5  . metoprolol (LOPRESSOR) 100 MG tablet Take 1 tablet (100 mg total) by mouth 2 (two) times daily. 180 tablet 3  . nortriptyline (PAMELOR) 25 MG capsule Take 1 capsule by mouth at bedtime.    . orphenadrine (NORFLEX) 100 MG tablet Limit 1 tab by mouth per day or twice per day if tolerated 60 tablet 2  . pantoprazole (PROTONIX) 40 MG tablet Take 1 tablet (40 mg total) by mouth daily. 90 tablet 2  . ranitidine (ZANTAC) 300 MG tablet Take 1 tablet (300 mg total) by mouth at bedtime. 90 tablet 3  . Suvorexant (BELSOMRA) 15 MG TABS Take 15 mg by mouth at bedtime as needed. 30 tablet 5  . traMADol (ULTRAM) 50 MG tablet Limit 1 tablet by mouth 4 - 8  times per day if tolerated 240 tablet 0  . Vitamin D, Ergocalciferol, (DRISDOL) 50000 UNITS CAPS capsule Take 1 capsule (50,000 Units total) by mouth every 7 (seven) days. 8 capsule 0   Current Facility-Administered Medications on File Prior to Visit  Medication Dose Route Frequency Provider Last Rate Last Dose  . bupivacaine (MARCAINE) 0.5 % injection 30 mL  30 mL Other Once Mohammed Kindle, MD      . bupivacaine (PF) (MARCAINE) 0.25 %  injection 30 mL  30 mL Other Once Mohammed Kindle, MD      . bupivacaine (PF) (MARCAINE) 0.25 % injection 30 mL  30 mL Other Once Mohammed Kindle, MD      . fentaNYL (SUBLIMAZE) injection 100 mcg  100 mcg Intravenous Once Mohammed Kindle, MD      . fentaNYL (SUBLIMAZE) injection 100 mcg  100 mcg Intravenous Once Mohammed Kindle, MD      . lactated ringers infusion 1,000 mL  1,000 mL Intravenous Continuous Mohammed Kindle, MD      . lactated ringers infusion 1,000 mL  1,000 mL Intravenous Continuous Mohammed Kindle, MD      . lactated ringers infusion 1,000 mL  1,000 mL Intravenous Continuous Mohammed Kindle, MD      . lactated ringers infusion 1,000 mL  1,000 mL Intravenous Continuous Mohammed Kindle, MD      . lidocaine (PF) (XYLOCAINE) 1 % injection 10 mL  10 mL Subcutaneous Once Mohammed Kindle, MD      . midazolam (VERSED) 5 MG/5ML injection 5 mg  5 mg Intravenous Once Mohammed Kindle, MD      . midazolam (VERSED) 5 MG/5ML injection 5 mg  5 mg Intravenous Once Mohammed Kindle, MD      . orphenadrine (NORFLEX) injection 60 mg  60 mg Intramuscular Once Mohammed Kindle, MD      . orphenadrine (NORFLEX) injection 60 mg  60 mg Intramuscular Once Mohammed Kindle, MD      . triamcinolone acetonide (KENALOG-40) injection 40 mg  40 mg Other Once Mohammed Kindle, MD      . triamcinolone acetonide (KENALOG-40) injection 40 mg  40 mg Other Once Mohammed Kindle, MD      . triamcinolone acetonide (KENALOG-40) injection 40 mg  40 mg Other Once Mohammed Kindle, MD        Review of Systems  Constitutional: Negative for unusual diaphoresis or night sweats HENT: Negative for ear swelling or discharge Eyes: Negative for worsening visual haziness  Respiratory: Negative for choking and stridor.   Gastrointestinal: Negative for distension or worsening eructation Genitourinary: Negative for retention or change in urine volume.  Musculoskeletal: Negative for other MSK pain or swelling Skin: Negative for color change and worsening  wound Neurological: Negative for tremors and numbness other than noted  Psychiatric/Behavioral: Negative for decreased concentration or agitation other than above       Objective:   Physical Exam BP 140/80 mmHg  Pulse 88  Temp(Src) 98.5 F (36.9 C) (Oral)  Resp 20  Wt 205 lb (92.987 kg)  SpO2 95% VS noted, mild ill Constitutional: Pt appears in no apparent distress HENT: Head: NCAT.  Right Ear: External ear normal.  Left Ear: External ear normal.  Bilat tm's with mild erythema.  Max sinus areas mild tender.  Pharynx with mild erythema, no exudate Eyes: . Pupils are equal, round, and reactive to light. Conjunctivae and EOM are normal Neck: Normal range of motion. Neck supple.  Cardiovascular: Normal rate and regular rhythm.   Pulmonary/Chest: Effort normal and breath sounds without rales or wheezing.  Neurological: Pt is alert. Not confused , motor grossly intact Skin: Skin is warm. No rash, no LE edema Psychiatric: Pt behavior is normal. No agitation.     Assessment & Plan:

## 2015-05-17 NOTE — Patient Instructions (Signed)
Please take all new medication as prescribed - the antibiotic, and the diflucan if needed ° °Please continue all other medications as before, and refills have been done if requested. ° °Please have the pharmacy call with any other refills you may need. ° °Please keep your appointments with your specialists as you may have planned ° ° ° °

## 2015-05-17 NOTE — Progress Notes (Signed)
Pre visit review using our clinic review tool, if applicable. No additional management support is needed unless otherwise documented below in the visit note. 

## 2015-05-18 DIAGNOSIS — M25561 Pain in right knee: Secondary | ICD-10-CM | POA: Diagnosis not present

## 2015-05-18 DIAGNOSIS — M25562 Pain in left knee: Secondary | ICD-10-CM | POA: Diagnosis not present

## 2015-05-19 NOTE — Assessment & Plan Note (Signed)
stable overall by history and exam, recent data reviewed with pt, and pt to continue medical treatment as before,  to f/u any worsening symptoms or concerns SpO2 Readings from Last 3 Encounters:  05/17/15 95%  05/05/15 98%  05/04/15 98%

## 2015-05-19 NOTE — Assessment & Plan Note (Signed)
stable overall by history and exam, recent data reviewed with pt, and pt to continue medical treatment as before,  to f/u any worsening symptoms or concerns Lab Results  Component Value Date   HGBA1C 5.7 05/05/2015

## 2015-05-19 NOTE — Assessment & Plan Note (Signed)
Mild to mod, for antibx course,  to f/u any worsening symptoms or concerns 

## 2015-05-19 NOTE — Assessment & Plan Note (Signed)
stable overall by history and exam, recent data reviewed with pt, and pt to continue medical treatment as before,  to f/u any worsening symptoms or concerns BP Readings from Last 3 Encounters:  05/17/15 140/80  05/05/15 126/88  05/04/15 137/89

## 2015-05-23 DIAGNOSIS — M25562 Pain in left knee: Secondary | ICD-10-CM | POA: Diagnosis not present

## 2015-05-23 DIAGNOSIS — M25561 Pain in right knee: Secondary | ICD-10-CM | POA: Diagnosis not present

## 2015-05-25 ENCOUNTER — Ambulatory Visit (INDEPENDENT_AMBULATORY_CARE_PROVIDER_SITE_OTHER): Payer: BLUE CROSS/BLUE SHIELD | Admitting: Nurse Practitioner

## 2015-05-25 ENCOUNTER — Encounter: Payer: Self-pay | Admitting: Nurse Practitioner

## 2015-05-25 VITALS — BP 123/89 | HR 106 | Ht 66.0 in | Wt 210.0 lb

## 2015-05-25 DIAGNOSIS — M25562 Pain in left knee: Secondary | ICD-10-CM | POA: Diagnosis not present

## 2015-05-25 DIAGNOSIS — G43011 Migraine without aura, intractable, with status migrainosus: Secondary | ICD-10-CM

## 2015-05-25 DIAGNOSIS — M25561 Pain in right knee: Secondary | ICD-10-CM | POA: Diagnosis not present

## 2015-05-25 MED ORDER — ELETRIPTAN HYDROBROMIDE 40 MG PO TABS: 40.0000 mg | ORAL_TABLET | ORAL | Status: DC | PRN

## 2015-05-25 MED ORDER — NORTRIPTYLINE HCL 25 MG PO CAPS: 25.0000 mg | ORAL_CAPSULE | Freq: Every day | ORAL | Status: DC

## 2015-05-25 NOTE — Progress Notes (Signed)
[facility]  PATIENT: [name] DOB: Jun 21, 1970   REASON FOR VISIT: Follow-up for migraine HISTORY FROM: Patient    HISTORY OF PRESENT ILLNESS: [name] 45 years old right-handed Caucasian female, referred to her optometrist Dr. Truman Hayward for evaluation of frequent headaches She had a past medical history of bipolar disorder, is on polypharmacy treatment, was treated with lithium in the past, she is now on polypharmacy treatment She was evaluated by optometrist Dr. Truman Hayward in February 18 2013, for her yearly eye evaluation, normal ocular examination, intraocular pressure 16 mm mercury in the right eye, 17 in the left eye, healthy discs, normal visual field testing, she reported frequent headaches, was referred to our clinic for evaluation She reported migraine all her life, getting worse since 2012, bilateral retro-orbital area severe pounding headaches was associated light noise sensitivity, nauseous, lasting for hours to 3 days, She was given nortriptyline 20 mg every night as migraine prevention, which has helped her 50%, she reported less headaches, less severe, it also helps her sleep, no significant side effect, She tried Imitrex in the past, does not help her headaches, she is taking diclofenac, and the phenergan as needed for her headaches Trigger for her headache, stress, sleep deprivations she is having headaches one to twice a month  UPDATE July 25 2016YY:Last clinical visit was February 2015, she continue have migraines, couple times each month, triggers for migraine are bright light, stress, each headaches last about 2-3 days, severe pounding headaches with associated light noise sensitivity, nauseous, she has tried Maxalt, which does ease up her headaches, but does not take it away completely, previous Toradol shots, Fioricet was helpful too Imitrex was not helpful UPDATE 11/24/2014.CM Ms. [name], 45 year old female returns for follow-up. She has a  history of migraines and had 3 migraines last week however she will she has had her contacts out and had not replaced them. Her blurred vision and straining she feels now was the reason for her headaches. She is due to to get trigger point injections on Monday. Her Pamelor was decreased to 25 mg by her primary care because she did not want to take 3 10 mg tablets. She returns today for follow-up. UPDATE 04/27/2017CM Ms. [name], 45 year old female returns for follow-up. She is currently on nortriptyline for headaches No new sx. Nortriptyline working well. She cannot get relpax anymore. She got letter from [facility] in [place] stating they will not cover this medication but she has had it filled since then.  She has tried/failed sumitriptan. This was not effective. She has not found any specific foods or other environmental factors that cause problems. She returns for reevaluation. She continues to go to pain management. She states her mother's breast cancer has returned and is stage IV. She denies any weakness, sensory changes and says she feels her headaches are in good control    REVIEW OF SYSTEMS: Full 14 system review of systems performed and notable only for those listed, all others are neg:  Constitutional: neg  Cardiovascular: neg Ear/Nose/Throat: neg  Skin: neg Eyes: neg Respiratory: neg Gastroitestinal: neg  Hematology/Lymphatic: neg  Endocrine:Excessive thirst  Musculoskeletal Joint pain back pain  Allergy/Immunology: neg Neurological:Long history of migraines  Psychiatric:Anxiety  Sleep :Insomnia better   ALLERGIES: Allergies  Allergen Reactions  . Amoxicillin Itching    REACTION: QUESTIONABLE  . Ancef [Cefazolin] Itching  . Doxycycline Nausea And Vomiting    REACTION: vomiting  . Hydrochlorothiazide     REACTION: CRAMPS AT HIGHER DOSAGES  .  Lisinopril     Other reaction(s): Cough (finding) REACTION: COUGH  . Penicillins   . Percocet [Oxycodone-Acetaminophen]  Itching    itching  . Talwin [Pentazocine] Nausea And Vomiting    n/v    HOME MEDICATIONS: Outpatient Prescriptions Prior to Visit  Medication Sig Dispense Refill  . albuterol (PROAIR HFA) 108 (90 BASE) MCG/ACT inhaler Inhale 2 puffs into the lungs every 4 (four) hours as needed. For shortness of breath. 18 g 2  . albuterol (PROVENTIL) (2.5 MG/3ML) 0.083% nebulizer solution Take 3 mLs (2.5 mg total) by nebulization every 6 (six) hours as needed. For shortness of breath. 75 mL 0  . aspirin (BAYER ASPIRIN) 325 MG tablet Take 1 tablet (325 mg total) by mouth daily. 100 tablet 3  . budesonide-formoterol (SYMBICORT) 160-4.5 MCG/ACT inhaler Inhale 2 puffs into the lungs 2 (two) times daily. 1 Inhaler 2  . diazepam (VALIUM) 5 MG tablet Take 1 tablet (5 mg total) by mouth 2 (two) times daily as needed for anxiety. 60 tablet 2  . diclofenac sodium (VOLTAREN) 1 % GEL Apply 2-4 mg to painful area of skin 4 times per day if tolerated . Limit 4 grams per application [id number] g 2  . DULoxetine (CYMBALTA) 30 MG capsule Two capsules in the morning, and one capsule in the evening. 90 capsule 4  . eletriptan (RELPAX) 40 MG tablet Take 1 tablet (40 mg total) by mouth as needed for migraine or headache. May repeat in 2 hours if headache persists or recurs. 15 tablet 6  . fluconazole (DIFLUCAN) 150 MG tablet 1 tab by mouth every 3 days as needed 2 tablet 1  . HYDROcodone-acetaminophen (NORCO/VICODIN) 5-325 MG tablet Limit one half to one tablet by mouth per day if tolerated for breakthrough pain while taking tramadol 30 tablet 0  . Linaclotide (LINZESS) 145 MCG CAPS capsule Take 1-2 capsules (145-290 mcg total) by mouth daily as needed. 60 capsule 11  . lisdexamfetamine (VYVANSE) 30 MG capsule Take 1 capsule (30 mg total) by mouth daily. 30 capsule 0  . LYRICA 50 MG capsule TAKE 1 CAPSULE 3 TIMES DAILY 90 capsule 1  . meloxicam (MOBIC) 15 MG tablet Take 1 tablet (15 mg total) by mouth daily as needed for pain. 30 tablet  5  . metoprolol (LOPRESSOR) 100 MG tablet Take 1 tablet (100 mg total) by mouth 2 (two) times daily. 180 tablet 3  . nortriptyline (PAMELOR) 25 MG capsule Take 1 capsule by mouth at bedtime.    . orphenadrine (NORFLEX) 100 MG tablet Limit 1 tab by mouth per day or twice per day if tolerated 60 tablet 2  . pantoprazole (PROTONIX) 40 MG tablet Take 1 tablet (40 mg total) by mouth daily. 90 tablet 2  . ranitidine (ZANTAC) 300 MG tablet Take 1 tablet (300 mg total) by mouth at bedtime. 90 tablet 3  . sulfamethoxazole-trimethoprim (BACTRIM DS) 800-160 MG tablet Take 1 tablet by mouth 2 (two) times daily. 20 tablet 0  . Suvorexant (BELSOMRA) 15 MG TABS Take 15 mg by mouth at bedtime as needed. 30 tablet 5  . traMADol (ULTRAM) 50 MG tablet Limit 1 tablet by mouth 4 - 8  times per day if tolerated 240 tablet 0  . ARIPiprazole (ABILIFY) 30 MG tablet TAKE ONE (1) TABLET BY MOUTH EVERY DAY 30 tablet 3  . Vitamin D, Ergocalciferol, (DRISDOL) 50000 UNITS CAPS capsule Take 1 capsule (50,000 Units total) by mouth every 7 (seven) days. 8 capsule 0   Facility-Administered Medications Prior  to Visit  Medication Dose Route Frequency Provider Last Rate Last Dose  . bupivacaine (MARCAINE) 0.5 % injection 30 mL  30 mL Other Once Mohammed Kindle, MD      . bupivacaine (PF) (MARCAINE) 0.25 % injection 30 mL  30 mL Other Once Mohammed Kindle, MD      . bupivacaine (PF) (MARCAINE) 0.25 % injection 30 mL  30 mL Other Once Mohammed Kindle, MD      . fentaNYL (SUBLIMAZE) injection 100 mcg  100 mcg Intravenous Once Mohammed Kindle, MD      . fentaNYL (SUBLIMAZE) injection 100 mcg  100 mcg Intravenous Once Mohammed Kindle, MD      . lactated ringers infusion 1,000 mL  1,000 mL Intravenous Continuous Mohammed Kindle, MD      . lactated ringers infusion 1,000 mL  1,000 mL Intravenous Continuous Mohammed Kindle, MD      . lactated ringers infusion 1,000 mL  1,000 mL Intravenous Continuous Mohammed Kindle, MD      . lactated ringers infusion  1,000 mL  1,000 mL Intravenous Continuous Mohammed Kindle, MD      . lidocaine (PF) (XYLOCAINE) 1 % injection 10 mL  10 mL Subcutaneous Once Mohammed Kindle, MD      . midazolam (VERSED) 5 MG/5ML injection 5 mg  5 mg Intravenous Once Mohammed Kindle, MD      . midazolam (VERSED) 5 MG/5ML injection 5 mg  5 mg Intravenous Once Mohammed Kindle, MD      . orphenadrine (NORFLEX) injection 60 mg  60 mg Intramuscular Once Mohammed Kindle, MD      . orphenadrine (NORFLEX) injection 60 mg  60 mg Intramuscular Once Mohammed Kindle, MD      . triamcinolone acetonide (KENALOG-40) injection 40 mg  40 mg Other Once Mohammed Kindle, MD      . triamcinolone acetonide [facility]) injection 40 mg  40 mg Other Once Mohammed Kindle, MD      . triamcinolone acetonide (KENALOG-40) injection 40 mg  40 mg Other Once Mohammed Kindle, MD        PAST MEDICAL HISTORY: Past Medical History  Diagnosis Date  . Attention deficit disorder without mention of hyperactivity   . Anxiety state, unspecified   . Pain in joint, site unspecified   . Unspecified asthma(493.90)   . Migraine without aura, without mention of intractable migraine without mention of status migrainosus   . Depressive disorder, not elsewhere classified   . Unspecified essential hypertension   . Unspecified vitamin D deficiency   . Hypertension   . Esophageal reflux     PAST SURGICAL HISTORY: Past Surgical History  Procedure Laterality Date  . Partial hysterectomy  2007  . Tonsillectomy  04/29/00  . Incise and drain abcess  2011    Right axilla- Dr. Barkley Bruns  . Abdominal ultrasound  10/12/97  . Plantar fascia surgery  2000  . Transthoracic echocardiogram  11/2009    EF=>55%; trace MR & TR  . Nm myocar perf wall motion  11/2009    bruce myoview; perfusion defect in anterior region consistent with breast attenuation; remaining myocardium with normal perfusion; post-stress EF 74%; low risk scan   . Appendectomy  05/27/2013    gangrenous  . Abdominal hysterectomy     . Tonsillectomy Bilateral   . Knee arthroscopy      both knees    FAMILY HISTORY: Family History  Problem Relation Age of Onset  . Asthma      Family history  . Coronary  artery disease      1st degree female < 50  . Diabetes      1st degree relative  . Breast cancer Mother   . Multiple sclerosis Mother   . Cancer Mother 29    breast ca  . Depression Mother   . Lupus Sister     PTSD  . Alcohol abuse Sister   . Bipolar disorder Sister   . Alcohol abuse Brother     also HTN, lupus, enlarged heart  . Heart disease Father     cardiac arrest  . Heart disease Maternal Grandfather     cardiac arrest  . COPD Paternal Grandmother   . Diabetes Paternal Grandfather   . Heart disease Paternal Grandfather   . ADD / ADHD Child   . Asthma Child     SOCIAL HISTORY: Social History   Social History  . Marital Status: Married    Spouse Name: N/A  . Number of Children: 2  . Years of Education: N/A   Occupational History  . 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 Smoking status: Current Every Day Smoker -- 1.00 packs/day for 27 years    Types: Cigarettes    Start date: 10/31/1986    Last Attempt to Quit: 11/22/2014  . Smokeless tobacco: Never Used  . Alcohol Use: No  . Drug Use: No  . Sexual Activity: Yes    Birth Control/ Protection: None   Other Topics Concern  . Not on file   Social History Narrative   Patient lives at home with her mother.   Disabled   Right handed   Caffeine three cups daily   Some college education     PHYSICAL EXAM  Filed Vitals:   05/25/15 1455  BP: 123/89  Pulse: 106  Height: 5\' 6"  (1.676 m)  Weight: 210 lb (95.255 kg)   Body mass index is 33.91 kg/(m^2). Generalized: Well developed, Obese femalein no acute distress  Head: normocephalic and atraumatic,. Oropharynx benign  Neck: Supple, no carotid bruits  Cardiac: Regular rate rhythm, no murmur   Musculoskeletal: No deformity   Neurological examination   Mentation: Alert oriented to time, place, history taking. Attention span and concentration appropriate. Recent and remote memory intact. Follows all commands speech and language fluent.   Cranial nerve II-XII: Pupils were equal round reactive to light extraocular movements were full, visual field were full on confrontational test. Facial sensation and strength were normal. hearing was intact to finger rubbing bilaterally. Uvula tongue midline. head turning and shoulder shrug were normal and symmetric.Tongue protrusion into cheek strength was normal. Motor: normal bulk and tone, full strength in the BUE, BLE, fine finger movements normal, no pronator drift. No focal weakness Sensory: normal and symmetric to light touch, pinprick, and Vibration, proprioception in upper and lower extremities Coordination: finger-nose-finger, heel-to-shin bilaterally, no dysmetria Reflexes: Brachioradialis 2/2, biceps 2/2, triceps 2/2, patellar 2/2, Achilles 2/2, plantar responses were flexor bilaterally. Gait and Station: Rising up from seated position without assistance, normal stance, moderate stride, good arm swing, smooth turning, able to perform tiptoe, and heel walking without difficulty. Tandem gait is steady  DIAGNOSTIC DATA (LABS, IMAGING, TESTING) - I reviewed patient records, labs, notes, testing and imaging myself where available.  Lab Results  Component Value Date   WBC 10.7* 05/05/2015   HGB 16.3* 05/05/2015   HCT 46.9* 05/05/2015   MCV 91.5 05/05/2015  PLT 271.0 05/05/2015      Component Value Date/Time   NA 138 05/05/2015 0729   NA 141 09/06/2014 1506   NA 136 06/02/2013 1435   K 4.2 05/05/2015 0729   K 4.5 09/06/2014 1506   K 4.2 06/02/2013 1435   CL 100 05/05/2015 0729   CL 104 06/02/2013 1435   CO2 30 05/05/2015 0729   CO2 25 09/06/2014 1506   CO2 27 06/02/2013 1435   GLUCOSE 82 05/05/2015 0729   GLUCOSE 90  09/06/2014 1506   GLUCOSE 115* 06/02/2013 1435   BUN 12 05/05/2015 0729   BUN 13.1 09/06/2014 1506   BUN 8 06/02/2013 1435   CREATININE 0.71 05/05/2015 0729   CREATININE 0.8 09/06/2014 1506   CREATININE 0.81 06/02/2013 1435   CREATININE 0.65 04/28/2010 1346   CALCIUM 10.3 05/05/2015 0729   CALCIUM 9.1 09/06/2014 1506   CALCIUM 9.1 06/02/2013 1435   PROT 7.1 09/06/2014 1506   PROT 8.5* 03/16/2014 0814   PROT 6.7 05/27/2013 1007   ALBUMIN 3.9 09/06/2014 1506   ALBUMIN 4.9 03/16/2014 0814   ALBUMIN 3.4 05/27/2013 1007   AST 15 09/06/2014 1506   AST 14 03/16/2014 0814   AST 18 05/27/2013 1007   ALT 18 09/06/2014 1506   ALT 19 03/16/2014 0814   ALT 29 05/27/2013 1007   ALKPHOS 118 09/06/2014 1506   ALKPHOS 141* 03/16/2014 0814   ALKPHOS 100 05/27/2013 1007   BILITOT <0.20 09/06/2014 1506   BILITOT 0.3 03/16/2014 0814   BILITOT 0.5 05/27/2013 1007   GFRNONAA >60 06/02/2013 1435   GFRNONAA >90 09/20/2011 1424   GFRAA >60 06/02/2013 1435   GFRAA >90 09/20/2011 1424   Lab Results  Component Value Date   CHOL 216* 12/30/2014   HDL 37.50* 12/30/2014   LDLCALC 145* 12/30/2014   LDLDIRECT 161.1 02/18/2013   TRIG 164.0* 12/30/2014   CHOLHDL 6 12/30/2014   Lab Results  Component Value Date   HGBA1C 5.7 05/05/2015    Lab Results  Component Value Date   TSH 2.45 12/30/2014      ASSESSMENT AND PLAN 45 y.o. year old female has a past medical history of Anxiety state, Migraine without aura, without mention of intractable migraine without mention of status migrainosus; Depressive disorder, not elsewhere classified; here to follow up.  Continue Relpax acutely will change if necessary Continue Nortriptyline at 25 mg daily as preventive F/U in 6 months Dennie Bible, [facility], [facility], APRN  [facility] [address], [place], [place] [address] [phone]

## 2015-05-25 NOTE — Patient Instructions (Signed)
Continue Relpax acutely Continue Nortriptyline at 25 mg daily as preventive Avoid migraine  triggers   F/U in 6 months

## 2015-05-29 DIAGNOSIS — M25561 Pain in right knee: Secondary | ICD-10-CM | POA: Diagnosis not present

## 2015-05-29 DIAGNOSIS — M25562 Pain in left knee: Secondary | ICD-10-CM | POA: Diagnosis not present

## 2015-05-30 ENCOUNTER — Encounter: Payer: Self-pay | Admitting: Pain Medicine

## 2015-05-30 ENCOUNTER — Ambulatory Visit: Payer: BLUE CROSS/BLUE SHIELD | Attending: Pain Medicine | Admitting: Pain Medicine

## 2015-05-30 VITALS — BP 119/58 | HR 75 | Temp 98.3°F | Resp 16 | Ht 66.0 in | Wt 205.0 lb

## 2015-05-30 DIAGNOSIS — M47816 Spondylosis without myelopathy or radiculopathy, lumbar region: Secondary | ICD-10-CM

## 2015-05-30 DIAGNOSIS — M62830 Muscle spasm of back: Secondary | ICD-10-CM | POA: Insufficient documentation

## 2015-05-30 DIAGNOSIS — M4804 Spinal stenosis, thoracic region: Secondary | ICD-10-CM | POA: Insufficient documentation

## 2015-05-30 DIAGNOSIS — M170 Bilateral primary osteoarthritis of knee: Secondary | ICD-10-CM

## 2015-05-30 DIAGNOSIS — M47814 Spondylosis without myelopathy or radiculopathy, thoracic region: Secondary | ICD-10-CM

## 2015-05-30 DIAGNOSIS — G588 Other specified mononeuropathies: Secondary | ICD-10-CM | POA: Insufficient documentation

## 2015-05-30 DIAGNOSIS — M546 Pain in thoracic spine: Secondary | ICD-10-CM | POA: Diagnosis present

## 2015-05-30 DIAGNOSIS — M5134 Other intervertebral disc degeneration, thoracic region: Secondary | ICD-10-CM | POA: Diagnosis not present

## 2015-05-30 DIAGNOSIS — M5136 Other intervertebral disc degeneration, lumbar region: Secondary | ICD-10-CM | POA: Insufficient documentation

## 2015-05-30 DIAGNOSIS — M1611 Unilateral primary osteoarthritis, right hip: Secondary | ICD-10-CM | POA: Insufficient documentation

## 2015-05-30 DIAGNOSIS — M79606 Pain in leg, unspecified: Secondary | ICD-10-CM | POA: Diagnosis present

## 2015-05-30 DIAGNOSIS — M172 Bilateral post-traumatic osteoarthritis of knee: Secondary | ICD-10-CM

## 2015-05-30 DIAGNOSIS — M545 Low back pain: Secondary | ICD-10-CM | POA: Diagnosis present

## 2015-05-30 DIAGNOSIS — M5459 Other low back pain: Secondary | ICD-10-CM

## 2015-05-30 DIAGNOSIS — M79605 Pain in left leg: Secondary | ICD-10-CM

## 2015-05-30 DIAGNOSIS — M5481 Occipital neuralgia: Secondary | ICD-10-CM

## 2015-05-30 DIAGNOSIS — M47894 Other spondylosis, thoracic region: Secondary | ICD-10-CM

## 2015-05-30 DIAGNOSIS — M5137 Other intervertebral disc degeneration, lumbosacral region: Secondary | ICD-10-CM

## 2015-05-30 DIAGNOSIS — M79604 Pain in right leg: Secondary | ICD-10-CM

## 2015-05-30 MED ORDER — HYDROCODONE-ACETAMINOPHEN 5-325 MG PO TABS: ORAL_TABLET | ORAL | Status: DC

## 2015-05-30 MED ORDER — TRAMADOL HCL 50 MG PO TABS: ORAL_TABLET | ORAL | Status: DC

## 2015-05-30 MED ORDER — ORPHENADRINE CITRATE ER 100 MG PO TB12: ORAL_TABLET | ORAL | Status: DC

## 2015-05-30 NOTE — Progress Notes (Signed)
Safety precautions to be maintained throughout the outpatient stay will include: orient to surroundings, keep bed in low position, maintain call bell within reach at all times, provide assistance with transfer out of bed and ambulation.  Had knee surgery bilaterally 11/03/14   Going to PT now

## 2015-05-30 NOTE — Progress Notes (Signed)
Subjective:    Patient ID: [name], female    DOB: 07-18-1970, 45 y.o.   MRN: [id number]  HPI  The patient is a 45 year old female who returns to pain management for further evaluation and treatment of pain involving the region of the upper mid lower back and lower extremity regions. The patient denies any definite trauma change in events of daily living the call significant change in symptomatology. The patient continues to care for her mother who is ill and at home. We discussed patient's condition and patient is with significant pain occurring in the mid back as well as lower back lower extremity region. The patient states that she continues to have significant pain of the knees although she underwent Synvisc injections of the knees performed by orthopedic surgeon. The patient states that she has significant spasms of the mid back lower back region which is aggravated with standing walking twisting turning maneuvers as well as severe pain involving the region of the knees. Revise patient to continue to observe her response to the Synvisc injections and follow-up with orthopedic surgeon as discussed. We'll continue medications as prescribed this time and consider modification of medications and other aspects of treatment pending follow-up evaluations. All agreed to suggested treatment plan.  Review of Systems     Objective:   Physical Exam  There was tenderness to palpation of paraspinal musculature region cervical region cervical facet region with tenderness of the region of the thoracic facet thoracic paraspinal musculature region a moderate degree. No crepitus of the thoracic region was noted. There was tenderness of the acromioclavicular and glenohumeral joint regions. Palpation of these regions reproduced pain of mild-to-moderate degree. The patient appeared to be with unremarkable Spurling's maneuver. Palpation of the thoracic region was with no crepitus of the thoracic region Tinel  and Phalen's maneuver were without increase of pain of significant degree and patient appeared to be with bilaterally equal grip strength. There was tenderness to palpation of the lumbar paraspinal musculature region lumbar facet region of moderate degree with lateral bending rotation extension and palpation of the lumbar facets reproducing moderate discomfort. Straight leg raising was tolerates approximately 30 without increased pain with dorsiflexion. The knees were with tenderness to palpation with no increased warmth and erythema in the region of the knees. EHL strength appeared to be decreased. There was negative clonus negative Homans. Abdomen was nontender with no costovertebral angle tenderness noted      Assessment & Plan:    Degenerative disc disease of the thoracic spine 1. Stable thoracic spine since 2014. No acute osseous abnormality. 2. Mild disc degeneration most pronounced at T10-T11 contributing to mild spinal stenosis at that level. Stable minimal cord mass effect with no cord signal abnormality. 3. 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 Follow-up Dr.Daldorf  and neurosurgical eval as planned Follow-up Dr.Dumonski as needed  Continue physical therapy. Recommend massage and range of motion maneuvers as well as stretching maneuvers for treatment of pain of the thoracic region especially. Recommend treatment 3  times per week for 6 weeks  F/U  neurological evaluation. May consider pending follow-up evaluations  May consider radiofrequency rhizolysis or intraspinal procedures pending response to present treatment and F/U evaluation   Patient to call Pain Management Center should patient have concerns prior to scheduled return appointment

## 2015-05-30 NOTE — Patient Instructions (Signed)
PLAN   Continue present medication Norflex hydrocodone acetaminophen diclofenac gel and tramadol  F/U PCP  Plotnikov for evaliation of  BP and general medical  condition  F/U surgical evaluation. Follow-up Dr.Daldorf  and neurosurgical eval as planned Follow-up Dr.Dumonski as needed  Continue physical therapy. Recommend massage and range of motion maneuvers as well as stretching maneuvers for treatment of pain of the thoracic region especially. Recommend treatment 3 times per week for 6 weeks  F/U neurological evaluation. May consider pending follow-up evaluations  May consider radiofrequency rhizolysis or intraspinal procedures pending response to present treatment and F/U evaluation   Patient to call Pain Management Center should patient have concerns prior to scheduled return appointment

## 2015-06-01 DIAGNOSIS — M25561 Pain in right knee: Secondary | ICD-10-CM | POA: Diagnosis not present

## 2015-06-01 DIAGNOSIS — M25562 Pain in left knee: Secondary | ICD-10-CM | POA: Diagnosis not present

## 2015-06-02 ENCOUNTER — Telehealth: Payer: Self-pay | Admitting: *Deleted

## 2015-06-02 NOTE — Telephone Encounter (Signed)
LMVM for pt to return call about relpax.

## 2015-06-03 IMAGING — CT CT ABD-PELV W/ CM
2 of 5 series · 17 of 46 positions shown, 19 images · IV contrast (isovue)
Comparison: 02/09/2013

CLINICAL DATA: Right lower quadrant pain. Low-grade fever.
Leukocytosis.

EXAM:
CT ABDOMEN AND PELVIS WITH CONTRAST
TECHNIQUE: Multidetector CT imaging of the abdomen and pelvis was performed
using the standard protocol following bolus administration of
intravenous contrast.
CONTRAST:  125 mL Isovue 370

[Series 2: routine abd pel with · axial · 0.78mm/px · z∈[-518,-42]mm · 14 of 105 slices shown, 16 images]
[im 5/105  soft-tissue]
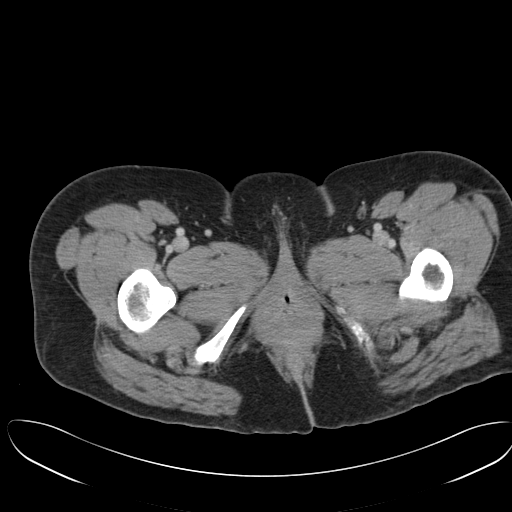
[im 5/105  bone]
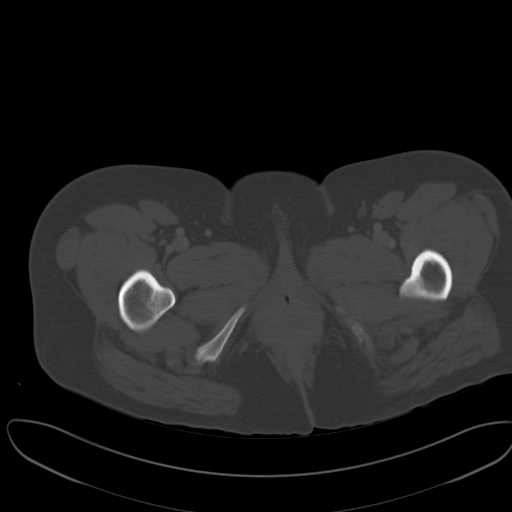
[im 15/105  soft-tissue]
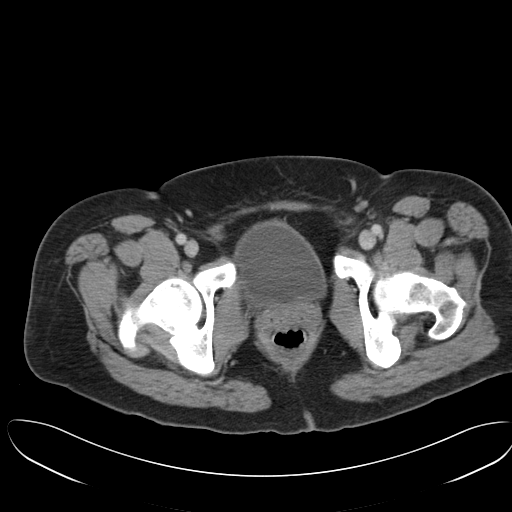
[im 20/105  soft-tissue]
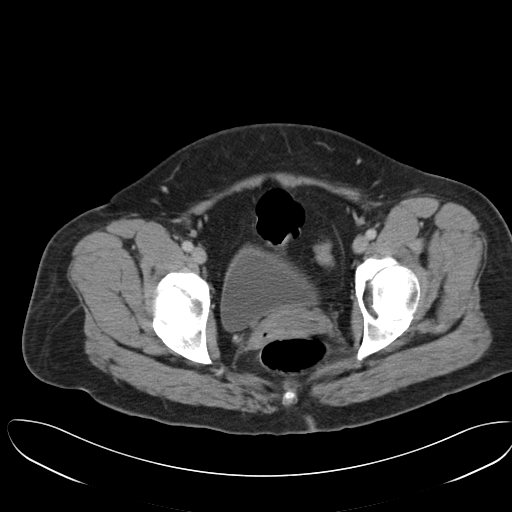
[im 30/105  soft-tissue]
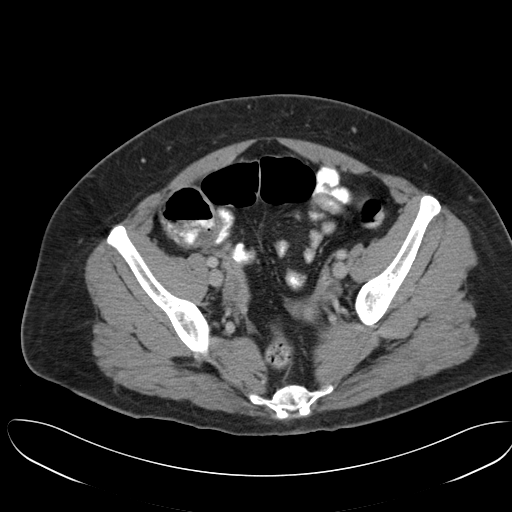
[im 35/105  soft-tissue]
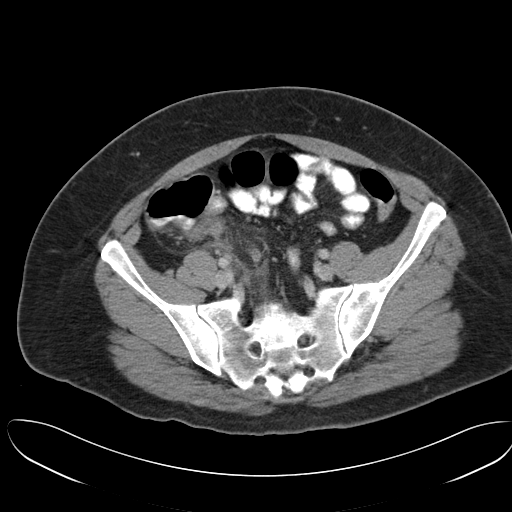
[im 40/105  soft-tissue]
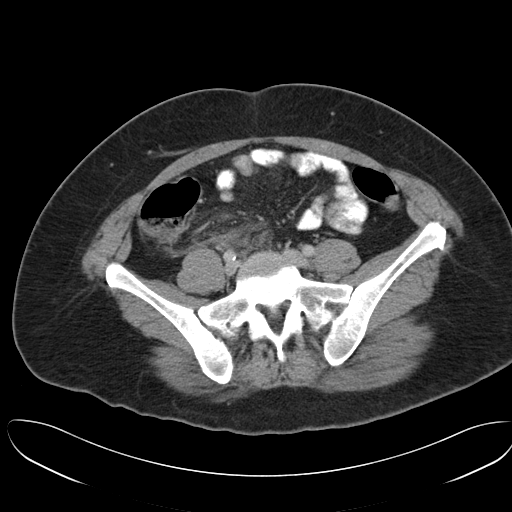
[im 50/105  soft-tissue]
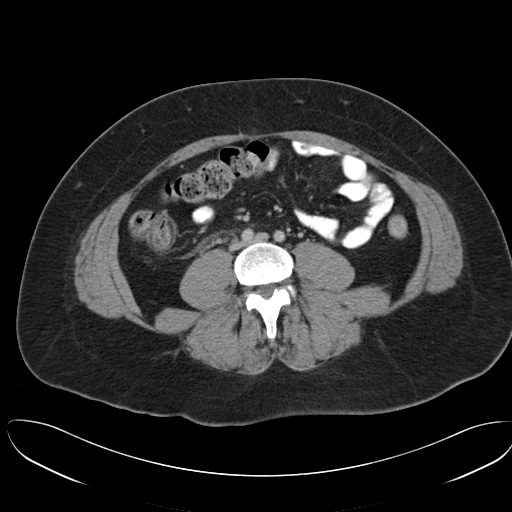
[im 55/105  soft-tissue]
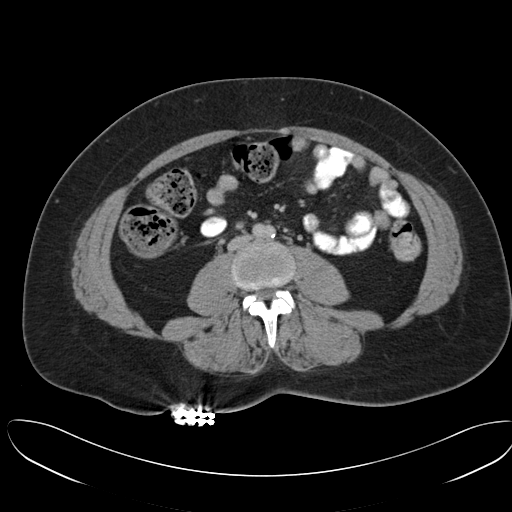
[im 65/105  soft-tissue]
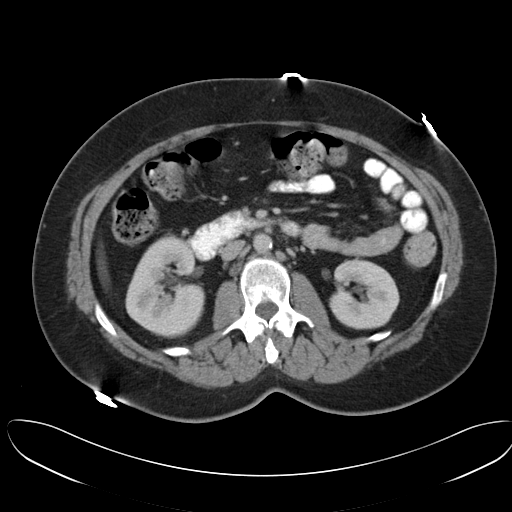
[im 65/105  bone]
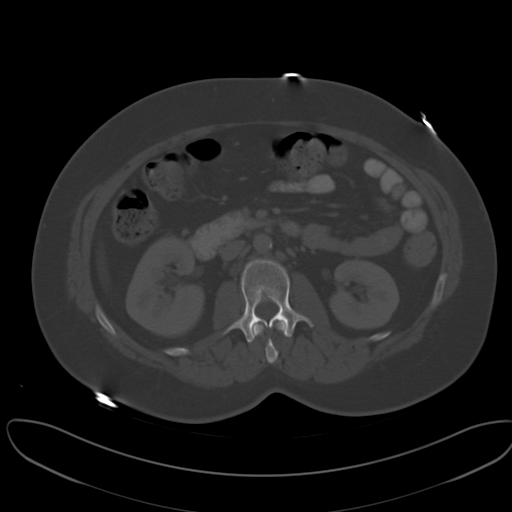
[im 70/105  soft-tissue]
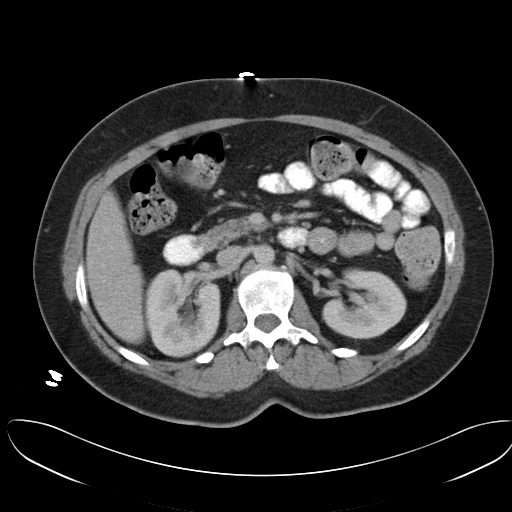
[im 80/105  soft-tissue]
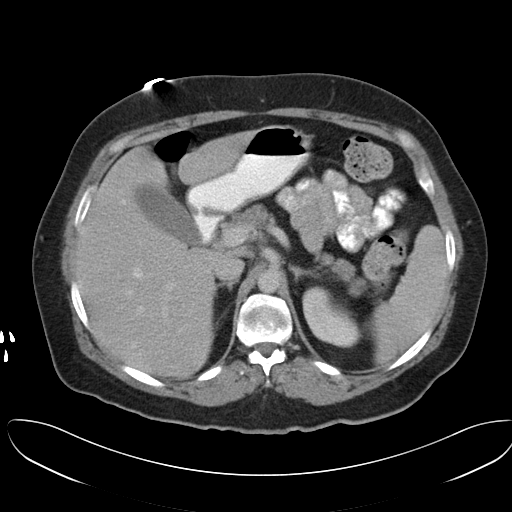
[im 85/105  soft-tissue]
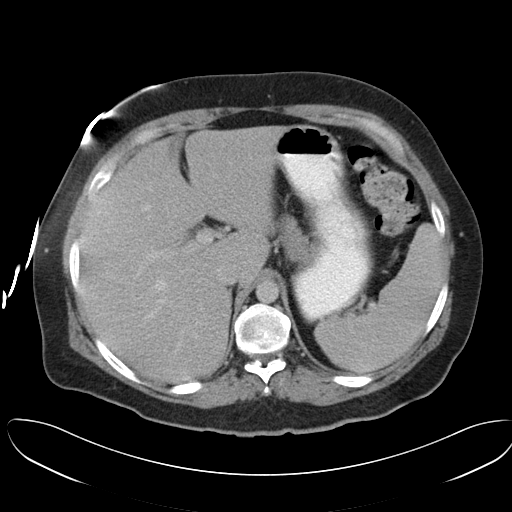
[im 90/105  soft-tissue]
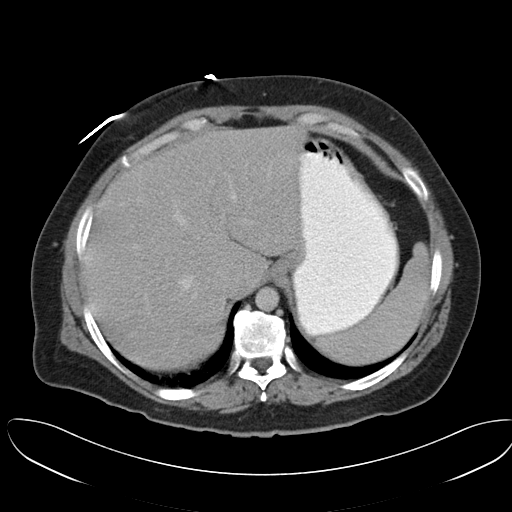
[im 100/105  soft-tissue]
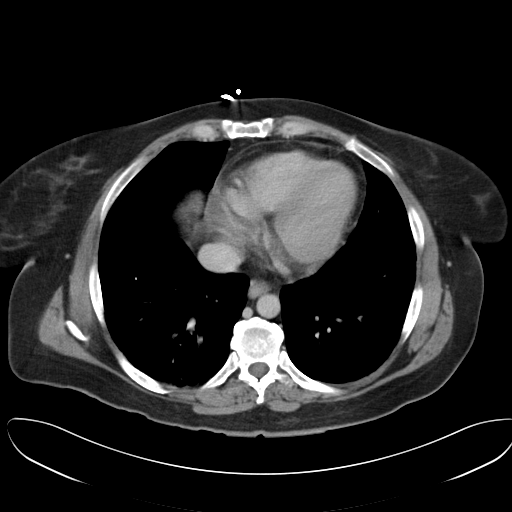

[Series 6: cor routine abd pel with · coronal · 0.93mm/px · 3 of 145 slices shown]
[im 49/145  soft-tissue]
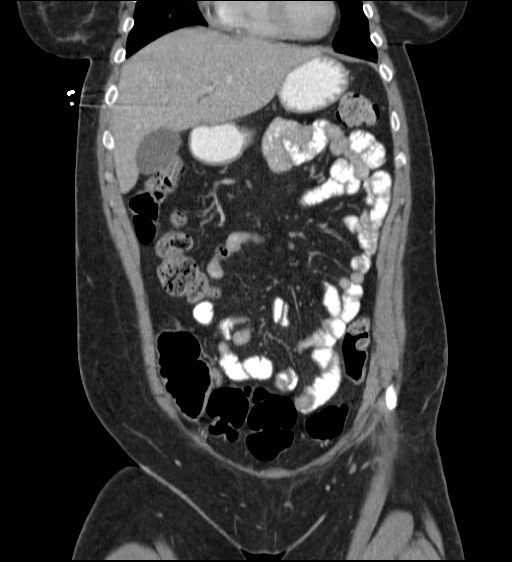
[im 65/145  soft-tissue]
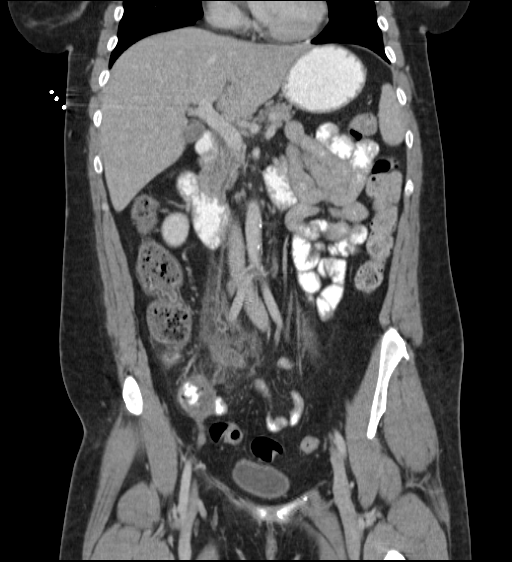
[im 81/145  soft-tissue]
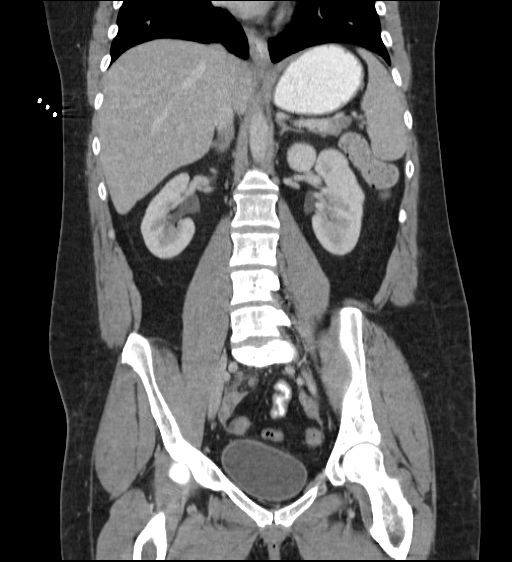

[17 of 46 positions shown; findings below may reference images not displayed]

FINDINGS: The appendix shows diffuse wall thickening with mild to moderate
periappendiceal inflammatory changes, consistent with acute
appendicitis. No evidence of abscess. Tiny amount of free fluid
noted in pelvic cul-de-sac. No evidence of bowel obstruction.

Uterus and adnexal regions are otherwise unremarkable in appearance.
The liver, gallbladder, pancreas, spleen, adrenal glands, and
kidneys are normal in appearance. No evidence of hydronephrosis. No
soft tissue masses or lymphadenopathy identified.
IMPRESSION: Positive for acute appendicitis. No evidence of abscess, bowel
obstruction, or other complication.

## 2015-06-05 ENCOUNTER — Other Ambulatory Visit: Payer: Self-pay | Admitting: Internal Medicine

## 2015-06-07 DIAGNOSIS — M25562 Pain in left knee: Secondary | ICD-10-CM | POA: Diagnosis not present

## 2015-06-07 DIAGNOSIS — M25561 Pain in right knee: Secondary | ICD-10-CM | POA: Diagnosis not present

## 2015-06-08 ENCOUNTER — Ambulatory Visit: Payer: Self-pay | Admitting: Internal Medicine

## 2015-06-09 NOTE — Telephone Encounter (Signed)
I called and spoke to pharmacist at [facility].  Pt picked up prescription for relpax for 12 tabs/ 30 days.  (this is covered).

## 2015-06-14 ENCOUNTER — Encounter: Payer: Self-pay | Admitting: Internal Medicine

## 2015-06-14 MED ORDER — LEVOFLOXACIN 500 MG PO TABS: 500.0000 mg | ORAL_TABLET | Freq: Every day | ORAL | Status: DC

## 2015-06-14 NOTE — Telephone Encounter (Signed)
Ok for levaquin asd,   Also You can also take Delsym OTC for cough, and/or Mucinex (or it's generic off brand) for congestion, and tylenol as needed for pain.

## 2015-06-16 ENCOUNTER — Other Ambulatory Visit: Payer: Self-pay | Admitting: Internal Medicine

## 2015-06-16 ENCOUNTER — Encounter: Payer: Self-pay | Admitting: Internal Medicine

## 2015-06-16 MED ORDER — METHYLPREDNISOLONE 4 MG PO TBPK: ORAL_TABLET | ORAL | Status: DC

## 2015-06-20 ENCOUNTER — Encounter: Payer: Self-pay | Admitting: Pain Medicine

## 2015-06-23 DIAGNOSIS — F3131 Bipolar disorder, current episode depressed, mild: Secondary | ICD-10-CM | POA: Diagnosis not present

## 2015-06-23 DIAGNOSIS — F411 Generalized anxiety disorder: Secondary | ICD-10-CM | POA: Diagnosis not present

## 2015-06-23 DIAGNOSIS — F410 Panic disorder [episodic paroxysmal anxiety] without agoraphobia: Secondary | ICD-10-CM | POA: Diagnosis not present

## 2015-06-23 DIAGNOSIS — F4312 Post-traumatic stress disorder, chronic: Secondary | ICD-10-CM | POA: Diagnosis not present

## 2015-06-29 ENCOUNTER — Encounter: Payer: Self-pay | Admitting: Pain Medicine

## 2015-06-29 ENCOUNTER — Ambulatory Visit: Payer: BLUE CROSS/BLUE SHIELD | Attending: Pain Medicine | Admitting: Pain Medicine

## 2015-06-29 VITALS — BP 120/68 | HR 71 | Temp 98.7°F | Resp 16 | Ht 65.0 in | Wt 205.0 lb

## 2015-06-29 DIAGNOSIS — M5136 Other intervertebral disc degeneration, lumbar region: Secondary | ICD-10-CM | POA: Diagnosis not present

## 2015-06-29 DIAGNOSIS — G588 Other specified mononeuropathies: Secondary | ICD-10-CM | POA: Diagnosis not present

## 2015-06-29 DIAGNOSIS — M1611 Unilateral primary osteoarthritis, right hip: Secondary | ICD-10-CM | POA: Diagnosis not present

## 2015-06-29 DIAGNOSIS — M4804 Spinal stenosis, thoracic region: Secondary | ICD-10-CM | POA: Diagnosis not present

## 2015-06-29 DIAGNOSIS — M47814 Spondylosis without myelopathy or radiculopathy, thoracic region: Secondary | ICD-10-CM

## 2015-06-29 DIAGNOSIS — M4806 Spinal stenosis, lumbar region: Secondary | ICD-10-CM | POA: Diagnosis not present

## 2015-06-29 DIAGNOSIS — M62830 Muscle spasm of back: Secondary | ICD-10-CM | POA: Insufficient documentation

## 2015-06-29 DIAGNOSIS — M47894 Other spondylosis, thoracic region: Secondary | ICD-10-CM

## 2015-06-29 DIAGNOSIS — M79604 Pain in right leg: Secondary | ICD-10-CM

## 2015-06-29 DIAGNOSIS — M5137 Other intervertebral disc degeneration, lumbosacral region: Secondary | ICD-10-CM

## 2015-06-29 DIAGNOSIS — M79605 Pain in left leg: Secondary | ICD-10-CM

## 2015-06-29 DIAGNOSIS — M47816 Spondylosis without myelopathy or radiculopathy, lumbar region: Secondary | ICD-10-CM | POA: Diagnosis not present

## 2015-06-29 DIAGNOSIS — M170 Bilateral primary osteoarthritis of knee: Secondary | ICD-10-CM | POA: Insufficient documentation

## 2015-06-29 DIAGNOSIS — M172 Bilateral post-traumatic osteoarthritis of knee: Secondary | ICD-10-CM

## 2015-06-29 DIAGNOSIS — M5459 Other low back pain: Secondary | ICD-10-CM

## 2015-06-29 DIAGNOSIS — M545 Low back pain: Secondary | ICD-10-CM | POA: Diagnosis present

## 2015-06-29 DIAGNOSIS — M5134 Other intervertebral disc degeneration, thoracic region: Secondary | ICD-10-CM

## 2015-06-29 DIAGNOSIS — M5481 Occipital neuralgia: Secondary | ICD-10-CM

## 2015-06-29 DIAGNOSIS — M79606 Pain in leg, unspecified: Secondary | ICD-10-CM | POA: Diagnosis present

## 2015-06-29 MED ORDER — HYDROCODONE-ACETAMINOPHEN 5-325 MG PO TABS: ORAL_TABLET | ORAL | Status: DC

## 2015-06-29 MED ORDER — TRAMADOL HCL 50 MG PO TABS: ORAL_TABLET | ORAL | Status: DC

## 2015-06-29 NOTE — Patient Instructions (Addendum)
PLAN   Continue present medication Norflex hydrocodone acetaminophen diclofenac gel and tramadol  Geniculate nerve block of knees to be performed at time of return appointment  F/U PCP  Plotnikov for evaliation of  BP and general medical  condition  F/U surgical evaluation. Follow-up Dr.Daldorf  and neurosurgical eval as planned Follow-up Dr.Dumonski as needed  Continue physical therapy. Recommend massage and range of motion maneuvers as well as stretching maneuvers for treatment of pain of the thoracic region especially. Recommend treatment 3 times per week for 6 weeks  F/U neurological evaluation. May consider pending follow-up evaluations  May consider radiofrequency rhizolysis or intraspinal procedures pending response to present treatment and F/U evaluation   Patient to call [facility] should patient have concerns prior to scheduled return appointment

## 2015-06-29 NOTE — Progress Notes (Signed)
Subjective:    Patient ID: [name], female    DOB: [birth year], 45 y.o.   MRN: [id number]  HPI  The patient is a 45 year old female who returns to pain management for further evaluation and treatment of pain involving the upper mid lower back lower extremity region and region of the knees. The patient had been caring for her mother and was engaged in rather strenuous activity block caring for her mother. The patient states that the pain of the knees is quite severe. The patient is undergone surgery of knees and states that the knees interfere with all activities of daily living including patient's ability to exercise. We discussed patient's condition and patient also is with pain in the mid back region states the pain knees is more significant. The patient wished to undergo interventional treatment for pain of the knees as discussed and will be considered for additional modifications of treatment regimen pending response to treatment and follow-up evaluation. The patient will continue present medications including Norflex tramadol and hydrocodone acetaminophen. We will proceed with geniculate nerve blocks of the knee to be performed at time return appointment. All agreed to suggested treatment plan     Review of Systems     Objective:   Physical Exam  There was tenderness of the splenius capitis and occipitalis region of moderate degree with moderate tenderness of the cervical facet and thoracic facet region with no crepitus of the thoracic region noted. Palpation of the acromioclavicular and glenohumeral joint regions reproduce moderate discomfort with moderate muscle spasms noted in the trapezius levator scapula and rhomboid musculature regions. No crepitus of the thoracic region was noted. There appeared to be bilaterally equal grip strength and Tinel and Phalen's maneuver were without increased pain of significant degree. Palpation over the region lumbar region lumbar facet region was  with moderate tenderness to palpation with lateral bending rotation and extension and palpation of the lumbar facets reproducing moderate discomfort. There was mild tenderness along the greater trochanteric region iliotibial band region. The knees were with crepitus of the knees with well-healed surgical scars of the knees noted. There was severe tenderness to palpation of the knees and crepitus of the knees aggravated with brain by range of motion maneuvers. There was no ballottement of the patella and there was negative anterior and posterior drawer signs. Straight leg raise was tolerates approximately 20 without increased pain with dorsiflexion noted. There was negative clonus negative Homans. Abdomen nontender and no costovertebral tenderness noted      Assessment & Plan:     Degenerative joint disease of knees  Degenerative disc disease of the thoracic spine 1. Stable thoracic spine since 2014. No acute osseous abnormality. 2. Mild disc degeneration most pronounced at T10-T11 contributing to mild spinal stenosis at that level. Stable minimal cord mass effect with no cord signal abnormality. 3. 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 Follow-up Dr.Daldorf  and neurosurgical eval as planned Follow-up Dr.Dumonski as needed  Continue physical therapy. Recommend massage and range of motion maneuvers as well as  stretching maneuvers for treatment of pain of the thoracic region especially. Recommend treatment 3 times per week for 6 weeks  F/U neurological evaluation. May consider pending follow-up evaluations

## 2015-06-29 NOTE — Progress Notes (Signed)
Safety precautions to be maintained throughout the outpatient stay will include: orient to surroundings, keep bed in low position, maintain call bell within reach at all times, provide assistance with transfer out of bed and ambulation.  

## 2015-07-03 ENCOUNTER — Other Ambulatory Visit: Payer: Self-pay | Admitting: *Deleted

## 2015-07-03 MED ORDER — ALBUTEROL SULFATE HFA 108 (90 BASE) MCG/ACT IN AERS: 2.0000 | INHALATION_SPRAY | RESPIRATORY_TRACT | Status: DC | PRN

## 2015-07-03 MED ORDER — BUDESONIDE-FORMOTEROL FUMARATE 160-4.5 MCG/ACT IN AERO: 2.0000 | INHALATION_SPRAY | Freq: Two times a day (BID) | RESPIRATORY_TRACT | Status: DC

## 2015-07-05 ENCOUNTER — Encounter: Payer: Self-pay | Admitting: Pain Medicine

## 2015-07-05 ENCOUNTER — Ambulatory Visit: Payer: BLUE CROSS/BLUE SHIELD | Attending: Pain Medicine | Admitting: Pain Medicine

## 2015-07-05 VITALS — BP 106/58 | HR 58 | Temp 97.8°F | Resp 18 | Ht 65.0 in | Wt 205.0 lb

## 2015-07-05 DIAGNOSIS — M170 Bilateral primary osteoarthritis of knee: Secondary | ICD-10-CM | POA: Diagnosis not present

## 2015-07-05 DIAGNOSIS — Z9889 Other specified postprocedural states: Secondary | ICD-10-CM | POA: Diagnosis not present

## 2015-07-05 DIAGNOSIS — M545 Low back pain: Secondary | ICD-10-CM | POA: Insufficient documentation

## 2015-07-05 DIAGNOSIS — G588 Other specified mononeuropathies: Secondary | ICD-10-CM

## 2015-07-05 DIAGNOSIS — M51379 Other intervertebral disc degeneration, lumbosacral region without mention of lumbar back pain or lower extremity pain: Secondary | ICD-10-CM

## 2015-07-05 DIAGNOSIS — M51369 Other intervertebral disc degeneration, lumbar region without mention of lumbar back pain or lower extremity pain: Secondary | ICD-10-CM

## 2015-07-05 DIAGNOSIS — M5459 Other low back pain: Secondary | ICD-10-CM

## 2015-07-05 DIAGNOSIS — M5136 Other intervertebral disc degeneration, lumbar region: Secondary | ICD-10-CM

## 2015-07-05 DIAGNOSIS — M5134 Other intervertebral disc degeneration, thoracic region: Secondary | ICD-10-CM

## 2015-07-05 DIAGNOSIS — M79604 Pain in right leg: Secondary | ICD-10-CM

## 2015-07-05 DIAGNOSIS — M47814 Spondylosis without myelopathy or radiculopathy, thoracic region: Secondary | ICD-10-CM

## 2015-07-05 DIAGNOSIS — M47894 Other spondylosis, thoracic region: Secondary | ICD-10-CM

## 2015-07-05 DIAGNOSIS — M5137 Other intervertebral disc degeneration, lumbosacral region: Secondary | ICD-10-CM

## 2015-07-05 DIAGNOSIS — M47816 Spondylosis without myelopathy or radiculopathy, lumbar region: Secondary | ICD-10-CM

## 2015-07-05 DIAGNOSIS — M172 Bilateral post-traumatic osteoarthritis of knee: Secondary | ICD-10-CM

## 2015-07-05 DIAGNOSIS — M5481 Occipital neuralgia: Secondary | ICD-10-CM

## 2015-07-05 DIAGNOSIS — M79606 Pain in leg, unspecified: Secondary | ICD-10-CM | POA: Diagnosis present

## 2015-07-05 DIAGNOSIS — M79605 Pain in left leg: Secondary | ICD-10-CM

## 2015-07-05 MED ORDER — LIDOCAINE HCL (PF) 1 % IJ SOLN
10.0000 mL | Freq: Once | INTRAMUSCULAR | Status: AC
  Administered 2015-07-05: 10 mL via SUBCUTANEOUS
  Filled 2015-07-05: qty 10

## 2015-07-05 MED ORDER — LACTATED RINGERS IV SOLN: 1000.0000 mL | INTRAVENOUS | Status: DC

## 2015-07-05 MED ORDER — SODIUM CHLORIDE 0.9% FLUSH
20.0000 mL | Freq: Once | INTRAVENOUS | Status: AC
  Administered 2015-07-05: 20 mL

## 2015-07-05 MED ORDER — ORPHENADRINE CITRATE 30 MG/ML IJ SOLN
60.0000 mg | Freq: Once | INTRAMUSCULAR | Status: AC
  Administered 2015-07-05: 60 mg via INTRAMUSCULAR
  Filled 2015-07-05: qty 2

## 2015-07-05 MED ORDER — CIPROFLOXACIN HCL 250 MG PO TABS: 250.0000 mg | ORAL_TABLET | Freq: Two times a day (BID) | ORAL | Status: DC

## 2015-07-05 MED ORDER — MIDAZOLAM HCL 5 MG/5ML IJ SOLN
5.0000 mg | Freq: Once | INTRAMUSCULAR | Status: AC
  Administered 2015-07-05: 5 mg via INTRAVENOUS
  Filled 2015-07-05: qty 5

## 2015-07-05 MED ORDER — BUPIVACAINE HCL (PF) 0.25 % IJ SOLN
30.0000 mL | Freq: Once | INTRAMUSCULAR | Status: AC
  Administered 2015-07-05: 30 mL
  Filled 2015-07-05: qty 30

## 2015-07-05 MED ORDER — TRIAMCINOLONE ACETONIDE 40 MG/ML IJ SUSP
40.0000 mg | Freq: Once | INTRAMUSCULAR | Status: AC
  Administered 2015-07-05: 40 mg
  Filled 2015-07-05: qty 1

## 2015-07-05 MED ORDER — FENTANYL CITRATE (PF) 100 MCG/2ML IJ SOLN
100.0000 ug | Freq: Once | INTRAMUSCULAR | Status: AC
  Administered 2015-07-05: 100 ug via INTRAVENOUS
  Filled 2015-07-05: qty 2

## 2015-07-05 MED ORDER — CIPROFLOXACIN IN D5W 400 MG/200ML IV SOLN
400.0000 mg | Freq: Once | INTRAVENOUS | Status: AC
  Administered 2015-07-05: 400 mg via INTRAVENOUS
  Filled 2015-07-05: qty 200

## 2015-07-05 NOTE — Progress Notes (Signed)
Subjective:    Patient ID: [name], female    DOB: [birth year], 45 y.o.   MRN: [id number]  HPI  Geniculate nerve blocks of the Left and right knees   The patient is a 45 y.o. female who returns to the [facility] for further evaluation and treatment of pain involving the lumbar lower extremity region with severe pain of the left and right knee. Prior studies reveal patient to be with significant degenerative joint disease of the knee.. The patient is status post surgical intervention of the knees as well with persistent pain despite prior treatment  We will proceed with geniculate nerve blocks of the left and right knees in an attempt to decrease severity of symptoms, minimize the risk of medication escalation, hopefully retard progression of symptoms and avoid the need for more involved treatment.  The risks benefits and expectations of the procedure were discussed with the patient. The patient was with understanding and in agreement with suggested treatment plan.  DESCRIPTION OF PROCEDURE: Geniculate nerve blocks of the left knee. The  procedure was performed with IV Versed and IV fentanyl, conscious sedation and under fluoroscopic guidance.  NEEDLE PLACEMENT FOR BLOCK OF THE LATERAL SUPERIOR GENICULATE NERVE: The patient was taking to the fluoroscopy suite. With the patient supine, with knee in flexed position, Betadine prep of proposed entry site accomplished.  IV Versed, IV fentanyl conscious sedation, EKG, blood pressure, pulse, capnography, and pulse oximetry monitoring were all in place. Under fluoroscopic guidance, a 22-gauge needle was inserted in the region of the left knee with needle placed at the lateral border of the femur at the junction of the shaft of the femur and the condyle of the femur.  Following needle placement at the lateral aspect of the knee, needle placement was then accomplished in the region of the medial aspect of the knee.  NEEDLE PLACEMENT FOR  BLOCK OF THE MEDIAL SUPERIOR GENICULATE NERVE:  Under fluoroscopic guidance, a 22 - gauge needle was inserted in the region of the left knee with needle placed at the medial border of the femur at the junction of the shaft of the femur and the condyle of the femur.   NEEDLE PLACEMENT FOR BLOCK OF THE MEDIAL INFERIOR GENICULATE NERVE:  Under fluoroscopic guidance, a 22 - gauge needle was inserted in the region of the left knee with needle placed at the junction of the shaft and plateau of the tibia.   Following needle placement on AP view of needles placed in all three locations, placement was then verified on lateral view with the tips of the superior lateral and superior medial needles documented to be one half the distance of the shaft of the femur and the tip of the inferior medial geniculate needle documented to be one half the distance of the shaft of the tibia.  Following documentation of needle placements on lateral view, each needle was injected with one mL of 0.25% bupivacaine with Kenalog.  A total of 10 mg of Kenalog was utilized for the procedure.    GENICULATE NERVE BLOCKS OF THE RIGHT KNEE  The procedure was performed on the right knee exactly as was performed on the left knee using the same technique and under fluoroscopic guidance as was utilized for geniculate nerve blocks of the left knee  A total of 10 mg of Kenalog was utilized for the procedure   The patient tolerated the procedure well.    PLAN 1. Medications: Continue present medications Norflex hydrocodone acetaminophen  diclofenac gel and tramadol 2. Follow-up appointment with PCP  Dr. Alain Marion for evaluation of blood pressure and general medical condition. 3. Follow-up surgical evaluation as discussed 4. Follow-up neurological evaluation area has been addressed 5. The patient may be a candidate for radiofrequency rhizolysis and other treatment pending response to treatment on today's visit and follow-up  evaluation. 6. The patient is advised to adhere to proper body mechanics and avoid activities which appear to aggravate condition   Review of Systems     Objective:   Physical Exam        Assessment & Plan:

## 2015-07-05 NOTE — Progress Notes (Signed)
Safety precautions to be maintained throughout the outpatient stay will include: orient to surroundings, keep bed in low position, maintain call bell within reach at all times, provide assistance with transfer out of bed and ambulation.  

## 2015-07-05 NOTE — Patient Instructions (Addendum)
PLAN   Continue present medication Norflex hydrocodone acetaminophen diclofenac gel and tramadol . Please obtain Cipro antibiotic today and begin taking Cipro antibiotic today as prescribed  F/U PCP  Plotnikov for evaliation of  BP and general medical  condition  F/U surgical evaluation. Follow-up Dr.Daldorf  and neurosurgical eval as planned Follow-up Dr.Dumonski as discussed  Continue physical therapy. Recommend massage and range of motion maneuvers as well as stretching maneuvers for treatment of pain of the thoracic region especially. Recommend treatment 3 times per week for 6 weeks as previously scheduled  F/U neurological evaluation. May consider PNCV/EMG studies and other studies pending follow-up evaluations  May consider radiofrequency rhizolysis or intraspinal procedures pending response to present treatment and F/U evaluation   Patient to call Pain Management Center should patient have concerns prior to scheduled return appointmentPain Management Discharge Instructions  General Discharge Instructions :  If you need to reach your doctor call: Monday-Friday 8:00 am - 4:00 pm at [phone] or toll free [phone].  After clinic hours [phone] to have operator reach doctor.  Bring all of your medication bottles to all your appointments in the pain clinic.  To cancel or reschedule your appointment with Pain Management please remember to call 24 hours in advance to avoid a fee.  Refer to the educational materials which you have been given on: General Risks, I had my Procedure. Discharge Instructions, Post Sedation.  Post Procedure Instructions:  The drugs you were given will stay in your system until tomorrow, so for the next 24 hours you should not drive, make any legal decisions or drink any alcoholic beverages.  You may eat anything you prefer, but it is better to start with liquids then soups and crackers, and gradually work up to solid foods.  Please notify your  doctor immediately if you have any unusual bleeding, trouble breathing or pain that is not related to your normal pain.  Depending on the type of procedure that was done, some parts of your body may feel week and/or numb.  This usually clears up by tonight or the next day.  Walk with the use of an assistive device or accompanied by an adult for the 24 hours.  You may use ice on the affected area for the first 24 hours.  Put ice in a Ziploc bag and cover with a towel and place against area 15 minutes on 15 minutes off.  You may switch to heat after 24 hours.GENERAL RISKS AND COMPLICATIONS  What are the risk, side effects and possible complications? Generally speaking, most procedures are safe.  However, with any procedure there are risks, side effects, and the possibility of complications.  The risks and complications are dependent upon the sites that are lesioned, or the type of nerve block to be performed.  The closer the procedure is to the spine, the more serious the risks are.  Great care is taken when placing the radio frequency needles, block needles or lesioning probes, but sometimes complications can occur. 1. Infection: Any time there is an injection through the skin, there is a risk of infection.  This is why sterile conditions are used for these blocks.  There are four possible types of infection. 1. Localized skin infection. 2. Central Nervous System Infection-This can be in the form of Meningitis, which can be deadly. 3. Epidural Infections-This can be in the form of an epidural abscess, which can cause pressure inside of the spine, causing compression of the spinal cord with subsequent paralysis. This would  require an emergency surgery to decompress, and there are no guarantees that the patient would recover from the paralysis. 4. Discitis-This is an infection of the intervertebral discs.  It occurs in about 1% of discography procedures.  It is difficult to treat and it may lead to  surgery.        2. Pain: the needles have to go through skin and soft tissues, will cause soreness.       3. Damage to internal structures:  The nerves to be lesioned may be near blood vessels or    other nerves which can be potentially damaged.       4. Bleeding: Bleeding is more common if the patient is taking blood thinners such as  aspirin, Coumadin, Ticiid, Plavix, etc., or if he/she have some genetic predisposition  such as hemophilia. Bleeding into the spinal canal can cause compression of the spinal  cord with subsequent paralysis.  This would require an emergency surgery to  decompress and there are no guarantees that the patient would recover from the  paralysis.       5. Pneumothorax:  Puncturing of a lung is a possibility, every time a needle is introduced in  the area of the chest or upper back.  Pneumothorax refers to free air around the  collapsed lung(s), inside of the thoracic cavity (chest cavity).  Another two possible  complications related to a similar event would include: Hemothorax and Chylothorax.   These are variations of the Pneumothorax, where instead of air around the collapsed  lung(s), you may have blood or chyle, respectively.       6. Spinal headaches: They may occur with any procedures in the area of the spine.       7. Persistent CSF (Cerebro-Spinal Fluid) leakage: This is a rare problem, but may occur  with prolonged intrathecal or epidural catheters either due to the formation of a fistulous  track or a dural tear.       8. Nerve damage: By working so close to the spinal cord, there is always a possibility of  nerve damage, which could be as serious as a permanent spinal cord injury with  paralysis.       9. Death:  Although rare, severe deadly allergic reactions known as "Anaphylactic  reaction" can occur to any of the medications used.      10. Worsening of the symptoms:  We can always make thing worse.  What are the chances of something like this  happening? Chances of any of this occuring are extremely low.  By statistics, you have more of a chance of getting killed in a motor vehicle accident: while driving to the hospital than any of the above occurring .  Nevertheless, you should be aware that they are possibilities.  In general, it is similar to taking a shower.  Everybody knows that you can slip, hit your head and get killed.  Does that mean that you should not shower again?  Nevertheless always keep in mind that statistics do not mean anything if you happen to be on the wrong side of them.  Even if a procedure has a 1 (one) in a 1,000,000 (million) chance of going wrong, it you happen to be that one..Also, keep in mind that by statistics, you have more of a chance of having something go wrong when taking medications.  Who should not have this procedure? If you are on a blood thinning medication (e.g. Coumadin, Plavix, see list of "  Blood Thinners"), or if you have an active infection going on, you should not have the procedure.  If you are taking any blood thinners, please inform your physician.  How should I prepare for this procedure?  Do not eat or drink anything at least six hours prior to the procedure.  Bring a driver with you .  It cannot be a taxi.  Come accompanied by an adult that can drive you back, and that is strong enough to help you if your legs get weak or numb from the local anesthetic.  Take all of your medicines the morning of the procedure with just enough water to swallow them.  If you have diabetes, make sure that you are scheduled to have your procedure done first thing in the morning, whenever possible.  If you have diabetes, take only half of your insulin dose and notify our nurse that you have done so as soon as you arrive at the clinic.  If you are diabetic, but only take blood sugar pills (oral hypoglycemic), then do not take them on the morning of your procedure.  You may take them after you have had the  procedure.  Do not take aspirin or any aspirin-containing medications, at least eleven (11) days prior to the procedure.  They may prolong bleeding.  Wear loose fitting clothing that may be easy to take off and that you would not mind if it got stained with Betadine or blood.  Do not wear any jewelry or perfume  Remove any nail coloring.  It will interfere with some of our monitoring equipment.  NOTE: Remember that this is not meant to be interpreted as a complete list of all possible complications.  Unforeseen problems may occur.  BLOOD THINNERS The following drugs contain aspirin or other products, which can cause increased bleeding during surgery and should not be taken for 2 weeks prior to and 1 week after surgery.  If you should need take something for relief of minor pain, you may take acetaminophen which is found in Tylenol,m Datril, Anacin-3 and Panadol. It is not blood thinner. The products listed below are.  Do not take any of the products listed below in addition to any listed on your instruction sheet.  A.P.C or A.P.C with Codeine Codeine Phosphate Capsules #3 Ibuprofen Ridaura  ABC compound Congesprin Imuran rimadil  Advil Cope Indocin Robaxisal  Alka-Seltzer Effervescent Pain Reliever and Antacid Coricidin or Coricidin-D  Indomethacin Rufen  Alka-Seltzer plus Cold Medicine Cosprin Ketoprofen S-A-C Tablets  Anacin Analgesic Tablets or Capsules Coumadin Korlgesic Salflex  Anacin Extra Strength Analgesic tablets or capsules CP-2 Tablets Lanoril Salicylate  Anaprox Cuprimine Capsules Levenox Salocol  Anexsia-D Dalteparin Magan Salsalate  Anodynos Darvon compound Magnesium Salicylate Sine-off  Ansaid Dasin Capsules Magsal Sodium Salicylate  Anturane Depen Capsules Marnal Soma  APF Arthritis pain formula Dewitt's Pills Measurin Stanback  Argesic Dia-Gesic Meclofenamic Sulfinpyrazone  Arthritis Bayer Timed Release Aspirin Diclofenac Meclomen Sulindac  Arthritis pain formula  Anacin Dicumarol Medipren Supac  Analgesic (Safety coated) Arthralgen Diffunasal Mefanamic Suprofen  Arthritis Strength Bufferin Dihydrocodeine Mepro Compound Suprol  Arthropan liquid Dopirydamole Methcarbomol with Aspirin Synalgos  ASA tablets/Enseals Disalcid Micrainin Tagament  Ascriptin Doan's Midol Talwin  Ascriptin A/D Dolene Mobidin Tanderil  Ascriptin Extra Strength Dolobid Moblgesic Ticlid  Ascriptin with Codeine Doloprin or Doloprin with Codeine Momentum Tolectin  Asperbuf Duoprin Mono-gesic Trendar  Aspergum Duradyne Motrin or Motrin IB Triminicin  Aspirin plain, buffered or enteric coated Durasal Myochrisine Trigesic  Aspirin Suppositories Easprin Nalfon  Trillsate  Aspirin with Codeine Ecotrin Regular or Extra Strength Naprosyn Uracel  Atromid-S Efficin Naproxen Ursinus  Auranofin Capsules Elmiron Neocylate Vanquish  Axotal Emagrin Norgesic Verin  Azathioprine Empirin or Empirin with Codeine Normiflo Vitamin E  Azolid Emprazil Nuprin Voltaren  Bayer Aspirin plain, buffered or children's or timed BC Tablets or powders Encaprin Orgaran Warfarin Sodium  Buff-a-Comp Enoxaparin Orudis Zorpin  Buff-a-Comp with Codeine Equegesic Os-Cal-Gesic   Buffaprin Excedrin plain, buffered or Extra Strength Oxalid   Bufferin Arthritis Strength Feldene Oxphenbutazone   Bufferin plain or Extra Strength Feldene Capsules Oxycodone with Aspirin   Bufferin with Codeine Fenoprofen Fenoprofen Pabalate or Pabalate-SF   Buffets II Flogesic Panagesic   Buffinol plain or Extra Strength Florinal or Florinal with Codeine Panwarfarin   Buf-Tabs Flurbiprofen Penicillamine   Butalbital Compound Four-way cold tablets Penicillin   Butazolidin Fragmin Pepto-Bismol   Carbenicillin Geminisyn Percodan   Carna Arthritis Reliever Geopen Persantine   Carprofen Gold's salt Persistin   Chloramphenicol Goody's Phenylbutazone   Chloromycetin Haltrain Piroxlcam   Clmetidine heparin Plaquenil   Cllnoril Hyco-pap  Ponstel   Clofibrate Hydroxy chloroquine Propoxyphen         Before stopping any of these medications, be sure to consult the physician who ordered them.  Some, such as Coumadin (Warfarin) are ordered to prevent or treat serious conditions such as "deep thrombosis", "pumonary embolisms", and other heart problems.  The amount of time that you may need off of the medication may also vary with the medication and the reason for which you were taking it.  If you are taking any of these medications, please make sure you notify your pain physician before you undergo any procedures.

## 2015-07-06 ENCOUNTER — Telehealth: Payer: Self-pay | Admitting: *Deleted

## 2015-07-06 NOTE — Telephone Encounter (Signed)
Voicemail left for patient to call our office if there are questions or concerns re; procedure on yesterday.  

## 2015-07-07 DIAGNOSIS — F410 Panic disorder [episodic paroxysmal anxiety] without agoraphobia: Secondary | ICD-10-CM | POA: Diagnosis not present

## 2015-07-07 DIAGNOSIS — F4312 Post-traumatic stress disorder, chronic: Secondary | ICD-10-CM | POA: Diagnosis not present

## 2015-07-07 DIAGNOSIS — F411 Generalized anxiety disorder: Secondary | ICD-10-CM | POA: Diagnosis not present

## 2015-07-07 DIAGNOSIS — F3131 Bipolar disorder, current episode depressed, mild: Secondary | ICD-10-CM | POA: Diagnosis not present

## 2015-07-10 ENCOUNTER — Encounter: Payer: Self-pay | Admitting: Pain Medicine

## 2015-08-07 ENCOUNTER — Other Ambulatory Visit (INDEPENDENT_AMBULATORY_CARE_PROVIDER_SITE_OTHER): Payer: BLUE CROSS/BLUE SHIELD

## 2015-08-07 ENCOUNTER — Ambulatory Visit (INDEPENDENT_AMBULATORY_CARE_PROVIDER_SITE_OTHER): Payer: Medicare Other | Admitting: Internal Medicine

## 2015-08-07 ENCOUNTER — Encounter: Payer: Self-pay | Admitting: Internal Medicine

## 2015-08-07 VITALS — BP 130/80 | HR 78 | Wt 202.0 lb

## 2015-08-07 DIAGNOSIS — F411 Generalized anxiety disorder: Secondary | ICD-10-CM

## 2015-08-07 DIAGNOSIS — J4520 Mild intermittent asthma, uncomplicated: Secondary | ICD-10-CM

## 2015-08-07 DIAGNOSIS — I10 Essential (primary) hypertension: Secondary | ICD-10-CM | POA: Diagnosis not present

## 2015-08-07 DIAGNOSIS — F4321 Adjustment disorder with depressed mood: Secondary | ICD-10-CM

## 2015-08-07 LAB — HEPATIC FUNCTION PANEL
ALBUMIN: 4.4 g/dL (ref 3.5–5.2)
ALT: 16 U/L (ref 0–35)
AST: 15 U/L (ref 0–37)
Alkaline Phosphatase: 126 U/L — ABNORMAL HIGH (ref 39–117)
BILIRUBIN TOTAL: 0.3 mg/dL (ref 0.2–1.2)
Bilirubin, Direct: 0 mg/dL (ref 0.0–0.3)
Total Protein: 7.7 g/dL (ref 6.0–8.3)

## 2015-08-07 LAB — CBC WITH DIFFERENTIAL/PLATELET
BASOS ABS: 0.1 10*3/uL (ref 0.0–0.1)
BASOS PCT: 0.8 % (ref 0.0–3.0)
EOS PCT: 2.5 % (ref 0.0–5.0)
Eosinophils Absolute: 0.2 10*3/uL (ref 0.0–0.7)
HEMATOCRIT: 47.9 % — AB (ref 36.0–46.0)
Hemoglobin: 16.7 g/dL — ABNORMAL HIGH (ref 12.0–15.0)
LYMPHS ABS: 2.3 10*3/uL (ref 0.7–4.0)
LYMPHS PCT: 25.2 % (ref 12.0–46.0)
MCHC: 34.8 g/dL (ref 30.0–36.0)
MCV: 91.3 fl (ref 78.0–100.0)
MONOS PCT: 9.5 % (ref 3.0–12.0)
Monocytes Absolute: 0.9 10*3/uL (ref 0.1–1.0)
NEUTROS ABS: 5.8 10*3/uL (ref 1.4–7.7)
Neutrophils Relative %: 62 % (ref 43.0–77.0)
PLATELETS: 274 10*3/uL (ref 150.0–400.0)
RBC: 5.25 Mil/uL — ABNORMAL HIGH (ref 3.87–5.11)
RDW: 12.9 % (ref 11.5–15.5)
WBC: 9.3 10*3/uL (ref 4.0–10.5)

## 2015-08-07 LAB — BASIC METABOLIC PANEL
BUN: 8 mg/dL (ref 6–23)
CHLORIDE: 102 meq/L (ref 96–112)
CO2: 28 meq/L (ref 19–32)
Calcium: 10.2 mg/dL (ref 8.4–10.5)
Creatinine, Ser: 0.57 mg/dL (ref 0.40–1.20)
GFR: 123.22 mL/min (ref >60.00)
GLUCOSE: 106 mg/dL — AB (ref 70–99)
POTASSIUM: 4.6 meq/L (ref 3.5–5.1)
SODIUM: 138 meq/L (ref 135–145)

## 2015-08-07 LAB — LIPID PANEL
CHOLESTEROL: 207 mg/dL — AB (ref 0–200)
HDL: 38.4 mg/dL — ABNORMAL LOW (ref >39.00)
LDL CALC: 144 mg/dL — AB (ref 0–99)
NonHDL: 168.95
TRIGLYCERIDES: 127 mg/dL (ref 0.0–149.0)
Total CHOL/HDL Ratio: 5
VLDL: 25.4 mg/dL (ref 0.0–40.0)

## 2015-08-07 LAB — TSH: TSH: 1.24 u[IU]/mL (ref 0.35–4.50)

## 2015-08-07 NOTE — Assessment & Plan Note (Signed)
Mother died in May 2017. Grieving Discussed - in counseling

## 2015-08-07 NOTE — Assessment & Plan Note (Signed)
Proair prn

## 2015-08-07 NOTE — Progress Notes (Signed)
Subjective:  Patient ID: [name], female    DOB: 1970/03/06  Age: 45 y.o. MRN: [id number]  CC: No chief complaint on file.   HPI [name] presents for asthma, insomnia, asthma. Mother died in 05/31/2015. Sleeping better. Grieving  Outpatient Prescriptions Prior to Visit  Medication Sig Dispense Refill  . albuterol (PROAIR HFA) 108 (90 Base) MCG/ACT inhaler Inhale 2 puffs into the lungs every 4 (four) hours as needed. For shortness of breath. 18 g 2  . albuterol (PROVENTIL) (2.5 MG/3ML) 0.083% nebulizer solution Take 3 mLs (2.5 mg total) by nebulization every 6 (six) hours as needed. For shortness of breath. 75 mL 0  . Asenapine Maleate (SAPHRIS) 10 MG SUBL Place under the tongue at bedtime.    [name] aspirin (BAYER ASPIRIN) 325 MG tablet Take 1 tablet (325 mg total) by mouth daily. 100 tablet 3  . budesonide-formoterol (SYMBICORT) 160-4.5 MCG/ACT inhaler Inhale 2 puffs into the lungs 2 (two) times daily. 1 Inhaler 2  . diclofenac sodium (VOLTAREN) 1 % GEL Apply 2-4 mg to painful area of skin 4 times per day if tolerated . Limit 4 grams per application [id number] g 2  . eletriptan (RELPAX) 40 MG tablet Take 1 tablet (40 mg total) by mouth as needed for migraine or headache. May repeat in 2 hours if headache persists or recurs. 15 tablet 6  . fluconazole (DIFLUCAN) 150 MG tablet 1 tab by mouth every 3 days as needed 2 tablet 1  . HYDROcodone-acetaminophen (NORCO/VICODIN) 5-325 MG tablet Limit one half to one tablet by mouth per day if tolerated for breakthrough pain while taking tramadol 30 tablet 0  . levofloxacin (LEVAQUIN) 500 MG tablet Take 1 tablet (500 mg total) by mouth daily. 10 tablet 0  . Linaclotide (LINZESS) 145 MCG CAPS capsule Take 1-2 capsules (145-290 mcg total) by mouth daily as needed. 60 capsule 11  . lisdexamfetamine (VYVANSE) 30 MG capsule Take 1 capsule (30 mg total) by mouth daily. 30 capsule 0  . LYRICA 50 MG capsule TAKE 1 CAPSULE 3 TIMES DAILY 90 capsule 1  .  meloxicam (MOBIC) 15 MG tablet Take 1 tablet (15 mg total) by mouth daily as needed for pain. 30 tablet 5  . methylPREDNISolone (MEDROL DOSEPAK) 4 MG TBPK tablet As directed pc 21 tablet 0  . metoprolol (LOPRESSOR) 100 MG tablet Take 1 tablet (100 mg total) by mouth 2 (two) times daily. 180 tablet 3  . nortriptyline (PAMELOR) 25 MG capsule Take 1 capsule (25 mg total) by mouth at bedtime. 30 capsule 6  . orphenadrine (NORFLEX) 100 MG tablet Limit 1 tab by mouth per day or twice per day if tolerated 60 tablet 2  . pantoprazole (PROTONIX) 40 MG tablet Take 1 tablet (40 mg total) by mouth daily. 90 tablet 2  . ranitidine (ZANTAC) 300 MG tablet TAKE ONE TABLET BY MOUTH EVERY NIGHT AT BEDTIME 90 tablet 3  . Suvorexant (BELSOMRA) 15 MG TABS Take 15 mg by mouth at bedtime as needed. 30 tablet 5  . traMADol (ULTRAM) 50 MG tablet Limit 1 tablet by mouth 4 - 8  times per day if tolerated 240 tablet 0  . ciprofloxacin (CIPRO) 250 MG tablet Take 1 tablet (250 mg total) by mouth 2 (two) times daily. 14 tablet 0  . diazepam (VALIUM) 5 MG tablet Take 1 tablet (5 mg total) by mouth 2 (two) times daily as needed for anxiety. (Patient not taking: Reported on 08/07/2015) 60 tablet 2  .  albuterol (PROAIR HFA) 108 (90 BASE) MCG/ACT inhaler Inhale 2 puffs into the lungs every 4 (four) hours as needed. For shortness of breath. 18 g 2  . budesonide-formoterol (SYMBICORT) 160-4.5 MCG/ACT inhaler Inhale 2 puffs into the lungs 2 (two) times daily. 1 Inhaler 2  . DULoxetine (CYMBALTA) 30 MG capsule Two capsules in the morning, and one capsule in the evening. (Patient not taking: Reported on 08/07/2015) 90 capsule 4   Facility-Administered Medications Prior to Visit  Medication Dose Route Frequency Provider Last Rate Last Dose  . bupivacaine (MARCAINE) 0.5 % injection 30 mL  30 mL Other Once Mohammed Kindle, MD      . bupivacaine (PF) (MARCAINE) 0.25 % injection 30 mL  30 mL Other Once Mohammed Kindle, MD      . bupivacaine (PF)  (MARCAINE) 0.25 % injection 30 mL  30 mL Other Once Mohammed Kindle, MD      . fentaNYL (SUBLIMAZE) injection 100 mcg  100 mcg Intravenous Once Mohammed Kindle, MD      . fentaNYL (SUBLIMAZE) injection 100 mcg  100 mcg Intravenous Once Mohammed Kindle, MD      . lactated ringers infusion 1,000 mL  1,000 mL Intravenous Continuous Mohammed Kindle, MD      . lactated ringers infusion 1,000 mL  1,000 mL Intravenous Continuous Mohammed Kindle, MD      . lactated ringers infusion 1,000 mL  1,000 mL Intravenous Continuous Mohammed Kindle, MD      . lactated ringers infusion 1,000 mL  1,000 mL Intravenous Continuous Mohammed Kindle, MD      . lactated ringers infusion 1,000 mL  1,000 mL Intravenous Continuous Mohammed Kindle, MD      . lidocaine (PF) (XYLOCAINE) 1 % injection 10 mL  10 mL Subcutaneous Once Mohammed Kindle, MD      . midazolam (VERSED) 5 MG/5ML injection 5 mg  5 mg Intravenous Once Mohammed Kindle, MD      . midazolam (VERSED) 5 MG/5ML injection 5 mg  5 mg Intravenous Once Mohammed Kindle, MD      . orphenadrine (NORFLEX) injection 60 mg  60 mg Intramuscular Once Mohammed Kindle, MD      . orphenadrine (NORFLEX) injection 60 mg  60 mg Intramuscular Once Mohammed Kindle, MD      . triamcinolone acetonide (KENALOG-40) injection 40 mg  40 mg Other Once Mohammed Kindle, MD      . triamcinolone acetonide (KENALOG-40) injection 40 mg  40 mg Other Once Mohammed Kindle, MD      . triamcinolone acetonide (KENALOG-40) injection 40 mg  40 mg Other Once Mohammed Kindle, MD        ROS Review of Systems  Constitutional: Negative for chills, activity change, appetite change, fatigue and unexpected weight change.  HENT: Negative for congestion, mouth sores and sinus pressure.   Eyes: Negative for visual disturbance.  Respiratory: Negative for cough and chest tightness.   Gastrointestinal: Negative for nausea and abdominal pain.  Genitourinary: Negative for frequency, difficulty urinating and vaginal pain.  Musculoskeletal:  Negative for back pain and gait problem.  Skin: Negative for pallor and rash.  Neurological: Negative for dizziness, tremors, weakness, numbness and headaches.  Psychiatric/Behavioral: Positive for decreased concentration. Negative for suicidal ideas, confusion, sleep disturbance and dysphoric mood. The patient is nervous/anxious.     Objective:  BP 130/80 mmHg  Pulse 78  Wt 202 lb (91.627 kg)  SpO2 97%  BP Readings from Last 3 Encounters:  08/07/15 130/80  07/05/15 106/58  06/29/15  120/68    Wt Readings from Last 3 Encounters:  08/07/15 202 lb (91.627 kg)  07/05/15 205 lb (92.987 kg)  06/29/15 205 lb (92.987 kg)    Physical Exam  Constitutional: She appears well-developed. No distress.  HENT:  Head: Normocephalic.  Right Ear: External ear normal.  Left Ear: External ear normal.  Nose: Nose normal.  Mouth/Throat: Oropharynx is clear and moist.  Eyes: Conjunctivae are normal. Pupils are equal, round, and reactive to light. Right eye exhibits no discharge. Left eye exhibits no discharge.  Neck: Normal range of motion. Neck supple. No JVD present. No tracheal deviation present. No thyromegaly present.  Cardiovascular: Normal rate, regular rhythm and normal heart sounds.   Pulmonary/Chest: No stridor. No respiratory distress. She has no wheezes.  Abdominal: Soft. Bowel sounds are normal. She exhibits no distension and no mass. There is no tenderness. There is no rebound and no guarding.  Musculoskeletal: She exhibits no edema or tenderness.  Lymphadenopathy:    She has no cervical adenopathy.  Neurological: She displays normal reflexes. No cranial nerve deficit. She exhibits normal muscle tone. Coordination normal.  Skin: No rash noted. No erythema.  Psychiatric: Her behavior is normal. Judgment and thought content normal.  tearful tanned  Lab Results  Component Value Date   WBC 10.7* 05/05/2015   HGB 16.3* 05/05/2015   HCT 46.9* 05/05/2015   PLT 271.0 05/05/2015    GLUCOSE 82 05/05/2015   CHOL 216* 12/30/2014   TRIG 164.0* 12/30/2014   HDL 37.50* 12/30/2014   LDLDIRECT 161.1 02/18/2013   LDLCALC 145* 12/30/2014   ALT 18 09/06/2014   AST 15 09/06/2014   NA 138 05/05/2015   K 4.2 05/05/2015   CL 100 05/05/2015   CREATININE 0.71 05/05/2015   BUN 12 05/05/2015   CO2 30 05/05/2015   TSH 2.45 12/30/2014   INR 1.05 08/29/2014   HGBA1C 5.7 05/05/2015    Mm Screening Breast Tomo Bilateral  05/16/2015  CLINICAL DATA:  Screening. EXAM: 2D DIGITAL SCREENING BILATERAL MAMMOGRAM WITH CAD AND ADJUNCT TOMO COMPARISON:  Previous exam(s). ACR Breast Density Category b: There are scattered areas of fibroglandular density. FINDINGS: There are no findings suspicious for malignancy. Images were processed with CAD. IMPRESSION: No mammographic evidence of malignancy. A result letter of this screening mammogram will be mailed directly to the patient. RECOMMENDATION: Screening mammogram in one year. (Code:SM-B-01Y) BI-RADS CATEGORY  1: Negative. Electronically Signed   By: Lajean Manes M.D.   On: 05/16/2015 14:43    Assessment & Plan:   There are no diagnoses linked to this encounter. I have discontinued Ms. Manera's ciprofloxacin. I am also having her maintain her aspirin, albuterol, linaclotide, meloxicam, metoprolol, lisdexamfetamine, diclofenac sodium, pantoprazole, Suvorexant, diazepam, LYRICA, fluconazole, nortriptyline, eletriptan, orphenadrine, ranitidine, levofloxacin, methylPREDNISolone, traMADol, HYDROcodone-acetaminophen, Asenapine Maleate, albuterol, budesonide-formoterol, and DULoxetine. We will continue to administer bupivacaine (PF), orphenadrine, triamcinolone acetonide, bupivacaine, fentaNYL, lactated ringers, midazolam, orphenadrine, triamcinolone acetonide, bupivacaine (PF), fentaNYL, lactated ringers, lidocaine (PF), triamcinolone acetonide, lactated ringers, lactated ringers, midazolam, and lactated ringers.  Meds ordered this encounter  Medications    . DULoxetine (CYMBALTA) 60 MG capsule    Sig: Take 1 capsule by mouth daily.     Follow-up: No Follow-up on file.  Walker Kehr, MD

## 2015-08-07 NOTE — Assessment & Plan Note (Signed)
Mother died in May 2017. Sleeping better. Grieving Off Diazepam

## 2015-08-07 NOTE — Assessment & Plan Note (Addendum)
On Metoprolol Labs

## 2015-08-07 NOTE — Progress Notes (Signed)
Pre visit review using our clinic review tool, if applicable. No additional management support is needed unless otherwise documented below in the visit note. 

## 2015-08-08 ENCOUNTER — Encounter: Payer: Self-pay | Admitting: Pain Medicine

## 2015-08-08 ENCOUNTER — Ambulatory Visit: Payer: BLUE CROSS/BLUE SHIELD | Attending: Pain Medicine | Admitting: Pain Medicine

## 2015-08-08 VITALS — BP 135/74 | HR 67 | Temp 98.0°F | Resp 16 | Ht 65.0 in | Wt 204.0 lb

## 2015-08-08 DIAGNOSIS — M5134 Other intervertebral disc degeneration, thoracic region: Secondary | ICD-10-CM | POA: Diagnosis not present

## 2015-08-08 DIAGNOSIS — M79606 Pain in leg, unspecified: Secondary | ICD-10-CM | POA: Diagnosis present

## 2015-08-08 DIAGNOSIS — M62830 Muscle spasm of back: Secondary | ICD-10-CM | POA: Insufficient documentation

## 2015-08-08 DIAGNOSIS — M545 Low back pain: Secondary | ICD-10-CM | POA: Diagnosis present

## 2015-08-08 DIAGNOSIS — M170 Bilateral primary osteoarthritis of knee: Secondary | ICD-10-CM

## 2015-08-08 DIAGNOSIS — M47894 Other spondylosis, thoracic region: Secondary | ICD-10-CM

## 2015-08-08 DIAGNOSIS — M47816 Spondylosis without myelopathy or radiculopathy, lumbar region: Secondary | ICD-10-CM | POA: Insufficient documentation

## 2015-08-08 DIAGNOSIS — M172 Bilateral post-traumatic osteoarthritis of knee: Secondary | ICD-10-CM

## 2015-08-08 DIAGNOSIS — G588 Other specified mononeuropathies: Secondary | ICD-10-CM | POA: Diagnosis not present

## 2015-08-08 DIAGNOSIS — M5481 Occipital neuralgia: Secondary | ICD-10-CM

## 2015-08-08 DIAGNOSIS — M5136 Other intervertebral disc degeneration, lumbar region: Secondary | ICD-10-CM | POA: Insufficient documentation

## 2015-08-08 DIAGNOSIS — M546 Pain in thoracic spine: Secondary | ICD-10-CM | POA: Diagnosis present

## 2015-08-08 DIAGNOSIS — M47814 Spondylosis without myelopathy or radiculopathy, thoracic region: Secondary | ICD-10-CM

## 2015-08-08 DIAGNOSIS — M5459 Other low back pain: Secondary | ICD-10-CM

## 2015-08-08 DIAGNOSIS — M1611 Unilateral primary osteoarthritis, right hip: Secondary | ICD-10-CM | POA: Insufficient documentation

## 2015-08-08 DIAGNOSIS — M79604 Pain in right leg: Secondary | ICD-10-CM

## 2015-08-08 DIAGNOSIS — M4804 Spinal stenosis, thoracic region: Secondary | ICD-10-CM | POA: Insufficient documentation

## 2015-08-08 DIAGNOSIS — M79605 Pain in left leg: Secondary | ICD-10-CM

## 2015-08-08 DIAGNOSIS — M5137 Other intervertebral disc degeneration, lumbosacral region: Secondary | ICD-10-CM

## 2015-08-08 MED ORDER — TRAMADOL HCL 50 MG PO TABS: ORAL_TABLET | ORAL | Status: DC

## 2015-08-08 MED ORDER — HYDROCODONE-ACETAMINOPHEN 5-325 MG PO TABS: ORAL_TABLET | ORAL | Status: DC

## 2015-08-08 NOTE — Patient Instructions (Addendum)
PLAN   Continue present medication Norflex diclofenac gel and tramadol  F/U PCP  Plotnikov for evaliation of  BP and general medical  condition  F/U surgical evaluation. Follow-up Dr.Daldorf  and neurosurgical evaluation.. Follow-up Dr.Dumonski as needed  Continue physical therapy. Recommend massage and range of motion maneuvers as well as stretching maneuvers for treatment of pain of the thoracic region especially. Recommend treatment 3 times per week for 6 weeks  F/U neurological evaluation. May consider pending follow-up evaluations  May consider radiofrequency rhizolysis or intraspinal procedures pending response to present treatment and F/U evaluation   Patient to call [facility] should patient have concerns prior to scheduled return appointment.

## 2015-08-08 NOTE — Progress Notes (Signed)
Subjective:    Patient ID: [name], female    DOB: 1970/03/17, 45 y.o.   MRN: [id number]  HPI  The patient is a 45 year old female who returns to pain management for further evaluation and treatment of pain involving the region of the upper mid lower back and lower extremity regions. The patient states that her knees have improved with genicular nerve blocks. We have discussed pain involving the mid back region as well as lower back region and patient has spasms occurring in the mid and lower back region of moderate degree. At the present time we will avoid interventional treatment and we will continue medications Norflex tramadol and diclofenac gel. The patient denies any recent trauma or change in events of daily living to cause significant change in symptomatology. The patient is to call pain management should she have significant change in condition prior to scheduled return appointment     Review of Systems     Objective:   Physical Exam    The patient was with tenderness to palpation over the paraspinal musculature region of the cervical region and thoracic region of moderate degree with moderate tenderness of the splenius capitis and occipitalis regions. Palpation over the region of the acromioclavicular and glenohumeral joint regions reproduced minimal discomfort and patient was able to perform drop test without significant difficulty. Palpation over the region of the lower thoracic region was with moderate tenderness to palpation as well as the mid thoracic region. There was tenderness over the lumbar paraspinal musculatures and lumbar facet region a moderate degree with lateral bending rotation extension and palpation of the lumbar facets reproducing moderate discomfort. There was tenderness over the PSIS and PII S region of moderate degree and moderate tenderness of the greater trochanteric region iliotibial band region. There were well-healed surgical scars of the knees without  increased warmth erythema in the region of the knees. There was crepitus of the knees with tenderness to palpation of the knees and increased pain with range of motion maneuvers of the knees noted there appeared to be negative anterior and posterior drawer signs without ballottement of the patella. Straight leg raising was tolerates approximately 30 without an increase of pain with dorsiflexion noted. No sensory deficit or dermatomal distribution was detected. There was negative clonus negative Homans.        ASSESSMENT AND PLAN   Degenerative joint disease of knees  Degenerative disc disease of the thoracic spine 1. Stable thoracic spine since 2014. No acute osseous abnormality. 2. Mild disc degeneration most pronounced at T10-T11 contributing to mild spinal stenosis at that level. Stable minimal cord mass effect with no cord signal abnormality. 3. 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 Follow-up Dr.Daldorf  and neurosurgical evaluation.. Follow-up Dr.Dumonski as needed  Continue physical therapy. Recommend massage and range of motion maneuvers as well as stretching maneuvers for treatment of pain of the thoracic region especially. Recommend treatment 3 times per week for 6 weeks  F/U neurological evaluation. May consider pending follow-up evaluations  May consider radiofrequency rhizolysis or intraspinal procedures pending  response to present treatment and F/U evaluation   Patient to call [facility] Management Center  should patient have concerns prior to scheduled return appointment.

## 2015-08-08 NOTE — Progress Notes (Signed)
   Subjective:    Patient ID: [name], female    DOB: September 12, 1970, 45 y.o.   MRN: [id number]  HPI    Review of Systems     Objective:   Physical Exam        Assessment & Plan:

## 2015-08-08 NOTE — Progress Notes (Signed)
Safety precautions to be maintained throughout the outpatient stay will include: orient to surroundings, keep bed in low position, maintain call bell within reach at all times, provide assistance with transfer out of bed and ambulation.  

## 2015-08-09 ENCOUNTER — Telehealth: Payer: Self-pay | Admitting: *Deleted

## 2015-08-09 ENCOUNTER — Other Ambulatory Visit: Payer: Self-pay | Admitting: Pain Medicine

## 2015-08-09 DIAGNOSIS — M5134 Other intervertebral disc degeneration, thoracic region: Secondary | ICD-10-CM

## 2015-08-09 DIAGNOSIS — M172 Bilateral post-traumatic osteoarthritis of knee: Secondary | ICD-10-CM

## 2015-08-09 DIAGNOSIS — M47816 Spondylosis without myelopathy or radiculopathy, lumbar region: Secondary | ICD-10-CM

## 2015-08-09 DIAGNOSIS — M5136 Other intervertebral disc degeneration, lumbar region: Secondary | ICD-10-CM

## 2015-08-09 DIAGNOSIS — G588 Other specified mononeuropathies: Secondary | ICD-10-CM

## 2015-08-09 DIAGNOSIS — M47894 Other spondylosis, thoracic region: Secondary | ICD-10-CM

## 2015-08-09 DIAGNOSIS — M79604 Pain in right leg: Secondary | ICD-10-CM

## 2015-08-09 DIAGNOSIS — M79605 Pain in left leg: Secondary | ICD-10-CM

## 2015-08-09 DIAGNOSIS — M545 Low back pain: Secondary | ICD-10-CM

## 2015-08-09 DIAGNOSIS — M5459 Other low back pain: Secondary | ICD-10-CM

## 2015-08-09 DIAGNOSIS — M47814 Spondylosis without myelopathy or radiculopathy, thoracic region: Secondary | ICD-10-CM

## 2015-08-09 DIAGNOSIS — M5481 Occipital neuralgia: Secondary | ICD-10-CM

## 2015-08-09 DIAGNOSIS — M5137 Other intervertebral disc degeneration, lumbosacral region: Secondary | ICD-10-CM

## 2015-08-09 DIAGNOSIS — M170 Bilateral primary osteoarthritis of knee: Secondary | ICD-10-CM

## 2015-08-09 NOTE — Telephone Encounter (Signed)
Nurses and Secretaries Please call patient and described patient's pain to me in detail so that we can determine which procedure to scheduled for the patient. Thank you

## 2015-08-09 NOTE — Telephone Encounter (Signed)
Right mid back-  Shatoya please call patient and schedule for Monday July 24th.  ([facility]   Right side)

## 2015-08-09 NOTE — Telephone Encounter (Signed)
Pt called wanting a procedure completed , I may have overlooked it but I didn't see the order for right nerve block. Please advise me and call the pt....thanks

## 2015-08-14 ENCOUNTER — Telehealth: Payer: Self-pay | Admitting: *Deleted

## 2015-08-14 NOTE — Telephone Encounter (Signed)
Approval for relpax 40mg , non formulary exception thru 01-28-16 under [facility] part D.   ID # [id number]  PA# [id number].  [phone]

## 2015-08-15 ENCOUNTER — Other Ambulatory Visit: Payer: Self-pay | Admitting: Internal Medicine

## 2015-08-15 NOTE — Telephone Encounter (Signed)
Patient is requesting refill of lyrica to go to [facility].

## 2015-08-16 DIAGNOSIS — F410 Panic disorder [episodic paroxysmal anxiety] without agoraphobia: Secondary | ICD-10-CM | POA: Diagnosis not present

## 2015-08-16 DIAGNOSIS — F411 Generalized anxiety disorder: Secondary | ICD-10-CM | POA: Diagnosis not present

## 2015-08-16 DIAGNOSIS — F4312 Post-traumatic stress disorder, chronic: Secondary | ICD-10-CM | POA: Diagnosis not present

## 2015-08-16 DIAGNOSIS — F3131 Bipolar disorder, current episode depressed, mild: Secondary | ICD-10-CM | POA: Diagnosis not present

## 2015-08-16 MED ORDER — PREGABALIN 50 MG PO CAPS: 50.0000 mg | ORAL_CAPSULE | Freq: Three times a day (TID) | ORAL | Status: DC

## 2015-08-17 ENCOUNTER — Other Ambulatory Visit: Payer: Self-pay | Admitting: Pain Medicine

## 2015-08-17 NOTE — Telephone Encounter (Signed)
Called refill into [facility] had to leave on pharmacy vm....[name]

## 2015-08-17 NOTE — Telephone Encounter (Signed)
Per appointments Juliann Pulse scheduled the procedure on 08/30/2015

## 2015-08-17 NOTE — Telephone Encounter (Signed)
[name] and [name] I am okay with either date 08/21/2015 for 09/06/2015 The patient and you all may decide which date is referred I am okay with treating patient on either date  Thank you

## 2015-08-18 DIAGNOSIS — Z79899 Other long term (current) drug therapy: Secondary | ICD-10-CM | POA: Diagnosis not present

## 2015-08-30 ENCOUNTER — Ambulatory Visit: Payer: BLUE CROSS/BLUE SHIELD | Attending: Pain Medicine | Admitting: Pain Medicine

## 2015-08-30 ENCOUNTER — Encounter: Payer: Self-pay | Admitting: Pain Medicine

## 2015-08-30 VITALS — BP 110/64 | HR 62 | Temp 97.8°F | Resp 20 | Ht 65.0 in | Wt 204.0 lb

## 2015-08-30 DIAGNOSIS — M4804 Spinal stenosis, thoracic region: Secondary | ICD-10-CM | POA: Diagnosis not present

## 2015-08-30 DIAGNOSIS — M5137 Other intervertebral disc degeneration, lumbosacral region: Secondary | ICD-10-CM

## 2015-08-30 DIAGNOSIS — M546 Pain in thoracic spine: Secondary | ICD-10-CM | POA: Insufficient documentation

## 2015-08-30 DIAGNOSIS — M5134 Other intervertebral disc degeneration, thoracic region: Secondary | ICD-10-CM | POA: Insufficient documentation

## 2015-08-30 DIAGNOSIS — G588 Other specified mononeuropathies: Secondary | ICD-10-CM | POA: Insufficient documentation

## 2015-08-30 DIAGNOSIS — M47814 Spondylosis without myelopathy or radiculopathy, thoracic region: Secondary | ICD-10-CM

## 2015-08-30 DIAGNOSIS — M47894 Other spondylosis, thoracic region: Secondary | ICD-10-CM

## 2015-08-30 MED ORDER — BUPIVACAINE HCL (PF) 0.25 % IJ SOLN
30.0000 mL | Freq: Once | INTRAMUSCULAR | Status: AC
  Administered 2015-08-30: 30 mL
  Filled 2015-08-30: qty 30

## 2015-08-30 MED ORDER — TRIAMCINOLONE ACETONIDE 40 MG/ML IJ SUSP
40.0000 mg | Freq: Once | INTRAMUSCULAR | Status: AC
  Administered 2015-08-30: 40 mg
  Filled 2015-08-30: qty 1

## 2015-08-30 MED ORDER — ORPHENADRINE CITRATE 30 MG/ML IJ SOLN
60.0000 mg | Freq: Once | INTRAMUSCULAR | Status: AC
  Administered 2015-08-30: 60 mg via INTRAMUSCULAR
  Filled 2015-08-30: qty 2

## 2015-08-30 MED ORDER — FENTANYL CITRATE (PF) 100 MCG/2ML IJ SOLN
100.0000 ug | Freq: Once | INTRAMUSCULAR | Status: AC
  Administered 2015-08-30: 100 ug via INTRAVENOUS
  Filled 2015-08-30: qty 2

## 2015-08-30 MED ORDER — MIDAZOLAM HCL 5 MG/5ML IJ SOLN
5.0000 mg | Freq: Once | INTRAMUSCULAR | Status: AC
  Administered 2015-08-30: 5 mg via INTRAVENOUS
  Filled 2015-08-30: qty 5

## 2015-08-30 MED ORDER — LACTATED RINGERS IV SOLN
1000.0000 mL | INTRAVENOUS | Status: DC
  Administered 2015-08-30: 1000 mL via INTRAVENOUS

## 2015-08-30 MED ORDER — CIPROFLOXACIN HCL 250 MG PO TABS: 250.0000 mg | ORAL_TABLET | Freq: Two times a day (BID) | ORAL | 0 refills | Status: DC

## 2015-08-30 MED ORDER — CIPROFLOXACIN IN D5W 400 MG/200ML IV SOLN
400.0000 mg | Freq: Once | INTRAVENOUS | Status: AC
  Administered 2015-08-30: 400 mg via INTRAVENOUS
  Filled 2015-08-30: qty 200

## 2015-08-30 NOTE — Progress Notes (Signed)
      PROCEDURE PERFORMED:  Intercostal nerve block.  HISTORY OF PRESENT ILLNESS:  The patient is a 45 y.o. female who returns to the [facility] for further evaluation and treatment of pain involving the midportion of the back. The patient is with known degenerative changes of the thoracic spine with prior MRI revealing degenerative disc disease of the thoracic spine 1. Stable thoracic spine since 2014. No acute osseous abnormality. 2. Mild disc degeneration most pronounced at T10-T11 contributing to mild spinal stenosis at that level. Stable minimal cord mass effect with no cord signal abnormality. 3. More widespread facet and ligament flavum hypertrophy There is concern regarding the patient's pain being due to significant component of intercostal neuralgia with associated severe muscle spasms The risks, benefits, and expectations of the procedure have been discussed and explained to the patient who was understanding and in agreement with suggested treatment plan. We will proceed with interventional treatment as discussed.   DESCRIPTION OF PROCEDURE: Intercostal nerve block with IV Versed, IV fentanyl conscious sedation, EKG, blood pressure, pulse, capnography, and pulse oximetry monitoring. The procedure was performed with the patient in the prone position under fluoroscopic guidance.   Intercostal nerve block, right side: With the patient in the prone position, Betadine prep of proposed entry site was performed under fluoroscopic guidance with AP view of the thoracic spine. Under fluoroscopic guidance, a 22 -gauge needle was inserted to contact bone of the 10th rib on the right side after which the needle was repositioned at the inferior border of the 10th rib on the right side under fluoroscopic guidance. Following documentation of needle placement, at the inferior border of the 10th rib on the right side and negative aspiration, a total of 3 mL of 0.25% bupivacaine with Kenalog was  injected for right side for 10 rib intercostal nerve block.   INTERCOSTAL NERVE BLOCKS AT T9, T8, T7, T6, and T5 LEVELS: The procedure was performed at these levels as was performed at the previous level, T 10, utilizing the same technique and under fluoroscopic guidance  Myoneural block injections of the thoracic region Following Betadine prep of proposed entry site a 22-gauge needle was inserted into the thoracic musculature region and following negative aspiration 1 cc of 0.25% bupivacaine with Norflex was injected for myoneural block injection of the thoracic region 4  A total of 10 mg Kenalog was utilized for the procedure.  The patient tolerated procedure well   PLAN:   1. Medications: We will continue presently prescribed medications Norflex hydrocodone acetaminophen and tramadol and Voltaren Gel . 2. The patient is to follow up with primary care physician Dr.  Alain Marion for further evaluation of blood pressure and general medical condition as discussed. 3. Surgical evaluation. Has been addressed  4. Neurological evaluation.Has been addressed  5. May consider the patient for additional studies pending response to treatment and follow-up evaluation. 6. May consider radiofrequency procedures, implantation type procedures and other treatment pending response to treatment and follow-up evaluation. 7. The patient has been advised to adhere to proper body mechanics and to call the Pain Management Center prior to scheduled return appointment should there be significant change in condition or have other concerns regarding condition prior to scheduled return appointment.  The patient is understanding and in agreement with suggested treatment plan.

## 2015-08-30 NOTE — Patient Instructions (Addendum)
PLAN   Continue present medication Norflex hydrocodone acetaminophen diclofenac gel and tramadol . Please obtain Cipro antibiotic today and begin taking Cipro antibiotic today as prescribed  F/U PCP  Plotnikov for evaliation of  BP and general medical  condition  F/U surgical evaluation. Follow-up Dr.Daldorf  and neurosurgical eval as planned Follow-up Dr.Dumonski as discussed  Continue physical therapy. Recommend massage and range of motion maneuvers as well as stretching maneuvers for treatment of pain of the thoracic region especially. Recommend treatment 3 times per week for 6 weeks as previously scheduled  F/U neurological evaluation. May consider PNCV/EMG studies and other studies pending follow-up evaluations  May consider radiofrequency rhizolysis or intraspinal procedures pending response to present treatment and F/U evaluation   Patient to call Pain Management Center should patient have concerns prior to scheduled return appointment  Intercostal Nerve Block Patient Information  Description: The twelve intercostal nerves arise from the first thru twelfth thoracic nerve roots.  The nerve begins at the spine and wraps around the body, lying in a groove underneath each rib.  Each intercostal nerve innervates a specific strip of skin and body walk of the abdomen and chest.  Therefore, injuries of the chest wall or abdominal wall result in pain that is transmitted back to the brian via the intercostal nerves.  Examples of such injuries include rib fractures and incisions for lung and gall bladder surgery.  Occasionally, pain may persist long after an injury or surgical incision secondary to inflammation and irritation of the intercostal nerve.  The longstanding pain is known as intercostal neuralgia.  An intercostal nerve block is preformed to eliminate pain either temporarily or permanently.  A small needle is placed below the rib and local anesthetic (like Novocaine) and possibly steroid  is injected.  Usually 2-4 intercostal nerves are blocked at a time depending on the problem.  The patient will experience a slight "pin-prick" sensation for each injection.  Shortly thereafter, the strip of skin that is innervated by the blocked intercostal nerve will feel numb.  Persistent pain that is only temporarily relieved with local anesthetic may require a more permanent block. This procedure is called Cryoneurolysis and entails placing a small probe beneath the rib to freeze the nerve.  Conditions that may be treated by intercostal nerve blocks:   Rib fractures  Longstanding pain from surgery of the chest or abdomen (intercostal neuralgia)  Pain from chest tubes  Pain from trauma to the chest  Preparation for the injections:  1. Do not eat any solid food or dairy products within 8 hours of your appointment. 2. You may drink clear liquids up to 3 hours before appointment.  Clear liquids include water, black coffee, juice or soda.  No milk or cream please. 3. You may take your regular medication, including pain medications, with a sip of water before your appointment.  Diabetics should hold regular insulin (if take separately) and take 1/2 normal NPH dose the morning of the procedure.   Carry some sugar containing items with you to your appointment. 4. A driver must accompany you and be prepared to drive you home after your procedure. 5. Bring all your current medications with you. 6. An IV may be inserted and sedation may be given at the discretion of the physician. 7. A blood pressure cuff, EKG and other monitors will often be applied during the procedure.  Some patients may need to have extra oxygen administered for a short period. 8. You will be asked to provide medical  information, including your allergies, prior to the procedure.  We must know immediately if you are taking blood thinners (like Coumadin/Warfarin) or if you are allergic to IV iodine contrast (dye). We must know if  you could possible be pregnant.  Possible side-effects:   Bleeding from needle site  Infection (rare)  Nerve injury (rare)  Numbness & tingling of skin  Collapsed lung requiring chest tube (rare)  Local anesthetic toxicity (rare)  Light-headedness (temporary)  Pain at injection site (several days)  Decreased blood pressure (temporary)  Shortness of breath  Jittery/shaking sensation (temporary)  Call if you experience:   Difficulty breathing or hives (go directly to the emergency room)  Redness, inflammation or drainage at the injection site  Severe pain at the site of the injection  Any new symptoms which are concerning   Please note:  Your pain may subside immediately but may return several hours after the injection.  Often, more than one injection is required to reduce the pain. Also, if several temporary blocks with local anesthetic are ineffective, a more permanent block with cryolysis may be necessary.  This will be discussed with you should this be the case.  If you have any questions, please call [phone] [facility]    Pain Management Discharge Instructions  General Discharge Instructions :  If you need to reach your doctor call: Monday-Friday 8:00 am - 4:00 pm at [phone] or toll free [phone].  After clinic hours [phone] to have operator reach doctor.  Bring all of your medication bottles to all your appointments in the pain clinic.  To cancel or reschedule your appointment with Pain Management please remember to call 24 hours in advance to avoid a fee.  Refer to the educational materials which you have been given on: General Risks, I had my Procedure. Discharge Instructions, Post Sedation.  Post Procedure Instructions:  The drugs you were given will stay in your system until tomorrow, so for the next 24 hours you should not drive, make any legal decisions or drink any alcoholic  beverages.  You may eat anything you prefer, but it is better to start with liquids then soups and crackers, and gradually work up to solid foods.  Please notify your doctor immediately if you have any unusual bleeding, trouble breathing or pain that is not related to your normal pain.  Depending on the type of procedure that was done, some parts of your body may feel week and/or numb.  This usually clears up by tonight or the next day.  Walk with the use of an assistive device or accompanied by an adult for the 24 hours.  You may use ice on the affected area for the first 24 hours.  Put ice in a Ziploc bag and cover with a towel and place against area 15 minutes on 15 minutes off.  You may switch to heat after 24 hours.  A prescription for CIPRO was sent to your pharmacy and should be available for pickup today.

## 2015-08-31 ENCOUNTER — Telehealth: Payer: Self-pay

## 2015-08-31 NOTE — Telephone Encounter (Signed)
Patient denies any needs or concerns with procedure at this time

## 2015-09-01 ENCOUNTER — Telehealth: Payer: Self-pay | Admitting: Pain Medicine

## 2015-09-01 NOTE — Telephone Encounter (Signed)
Patient called stating she called her pharmacy and the Cipro was not called in to there. Does she need this med and if so call to [facility]

## 2015-09-01 NOTE — Telephone Encounter (Signed)
Informed patient that Cipro was e scribed to [facility] with receipt confirmed.  Patient states she will call them.

## 2015-09-04 DIAGNOSIS — M222X1 Patellofemoral disorders, right knee: Secondary | ICD-10-CM | POA: Diagnosis not present

## 2015-09-06 ENCOUNTER — Ambulatory Visit: Payer: BLUE CROSS/BLUE SHIELD | Attending: Pain Medicine | Admitting: Pain Medicine

## 2015-09-06 ENCOUNTER — Encounter: Payer: Self-pay | Admitting: Pain Medicine

## 2015-09-06 VITALS — BP 118/85 | HR 95 | Temp 98.4°F | Ht 65.0 in | Wt 204.0 lb

## 2015-09-06 DIAGNOSIS — M9420 Chondromalacia, unspecified site: Secondary | ICD-10-CM

## 2015-09-06 DIAGNOSIS — M4804 Spinal stenosis, thoracic region: Secondary | ICD-10-CM | POA: Diagnosis not present

## 2015-09-06 DIAGNOSIS — M545 Low back pain: Secondary | ICD-10-CM | POA: Diagnosis present

## 2015-09-06 DIAGNOSIS — M5134 Other intervertebral disc degeneration, thoracic region: Secondary | ICD-10-CM

## 2015-09-06 DIAGNOSIS — M47816 Spondylosis without myelopathy or radiculopathy, lumbar region: Secondary | ICD-10-CM | POA: Insufficient documentation

## 2015-09-06 DIAGNOSIS — M170 Bilateral primary osteoarthritis of knee: Secondary | ICD-10-CM | POA: Diagnosis not present

## 2015-09-06 DIAGNOSIS — M62830 Muscle spasm of back: Secondary | ICD-10-CM | POA: Insufficient documentation

## 2015-09-06 DIAGNOSIS — M5136 Other intervertebral disc degeneration, lumbar region: Secondary | ICD-10-CM | POA: Diagnosis not present

## 2015-09-06 DIAGNOSIS — M79605 Pain in left leg: Secondary | ICD-10-CM | POA: Diagnosis present

## 2015-09-06 DIAGNOSIS — G588 Other specified mononeuropathies: Secondary | ICD-10-CM | POA: Insufficient documentation

## 2015-09-06 DIAGNOSIS — M1611 Unilateral primary osteoarthritis, right hip: Secondary | ICD-10-CM | POA: Insufficient documentation

## 2015-09-06 DIAGNOSIS — M79604 Pain in right leg: Secondary | ICD-10-CM | POA: Diagnosis present

## 2015-09-06 DIAGNOSIS — M5137 Other intervertebral disc degeneration, lumbosacral region: Secondary | ICD-10-CM

## 2015-09-06 MED ORDER — DICLOFENAC SODIUM 1 % TD GEL: TRANSDERMAL | 2 refills | Status: DC

## 2015-09-06 MED ORDER — HYDROCODONE-ACETAMINOPHEN 5-325 MG PO TABS: ORAL_TABLET | ORAL | 0 refills | Status: DC

## 2015-09-06 MED ORDER — TRAMADOL HCL 50 MG PO TABS: ORAL_TABLET | ORAL | 0 refills | Status: DC

## 2015-09-06 MED ORDER — ORPHENADRINE CITRATE ER 100 MG PO TB12: ORAL_TABLET | ORAL | 2 refills | Status: DC

## 2015-09-06 NOTE — Patient Instructions (Addendum)
PLAN   Continue present medication Norflex diclofenac gel tramadol and hydrocodone acetaminophen  F/U PCP  Plotnikov for evaliation of  BP and general medical  condition  F/U surgical evaluation. Follow-up Dr.Daldorf  and neurosurgical evaluation.. Follow-up Dr.Dumonski as needed  Continue physical therapy. Recommend massage and range of motion maneuvers as well as stretching maneuvers for treatment of pain of the thoracic region especially. as well as lumbar region and lower extremity region Recommend treatment 3 times per week for 6 weeks  F/U neurological evaluation. May consider pending follow-up evaluations  May consider radiofrequency rhizolysis or intraspinal procedures pending response to present treatment and F/U evaluation   Patient to call Pain Management Center should patient have concerns prior to scheduled return appointment.

## 2015-09-06 NOTE — Progress Notes (Signed)
   The patient is a 45 year old female who returns to pain management for further evaluation and treatment of pain involving the upper mid lower back lower extremity regions. The patient has had significant improvement of mid back pain following intercostal nerve blocks. The patient also has pain involving the knees. We have discussed radiofrequency procedures for the knees and will consider such treatment. At the present time patient continues medications consisting of Norflex tramadol hydrocodone acetaminophen and Voltaren gel area will continue presently prescribed medications. The patient is call pain management should there be significant change in condition prior to scheduled return appointment. All agreed to suggested treatment plan.   Physical examination  There was mild tenderness of the splenius capitis and occipitalis region as well as the cervical and thoracic facet region. There was mild tenderness of the trapezius levator scapula rhomboid musculature regions with mild muscle spasms noted. Palpation of the acromioclavicular and glenohumeral joint regions were without increased pain of significant degree and patient appeared to be with bilaterally equal grip strength without significant increase of pain with Tinel and Phalen's maneuver. Palpation of the lumbar paraspinal musculature region was with moderate tenderness to palpation with lateral bending and rotation. Palpation over the PSIS and PII S region reproduces moderate discomfort. The knees were attends to palpation without increased warmth erythema of the knees with well-healed surgical scars of the knees noted with crepitus of the knees. There was negative anterior and posterior drawer signs without ballottement of the patella. Range of motion maneuvers of the knees did produce moderate discomfort of the knees. Straight leg raising tolerate 30 without increased pain with dorsiflexion noted negative clonus negative Homans abdomen nontender  with no costovertebral tenderness noted     Assessment   Degenerative joint disease of knees  Degenerative disc disease of the thoracic spine 1. Stable thoracic spine since 2014. No acute osseous abnormality. 2. Mild disc degeneration most pronounced at T10-T11 contributing to mild spinal stenosis at that level. Stable minimal cord mass effect with no cord signal abnormality. 3. More widespread facet and ligament flavum hypertrophy  Intercostal neuralgia with severe muscle spasms of the thoracic region  Thoracic facet syndrome  Intercostal neuralgia  Degenerative disc disease lumbar spine Degenerative changes L3-4, L4-5, and L5-S1, degenerative changes  Lumbar facet syndrome  Degenerative joint disease of the right hip    PLAN   Continue present medication Norflex diclofenac gel tramadol and hydrocodone acetaminophen  F/U PCP  Plotnikov for evaliation of  BP and general medical  condition  F/U surgical evaluation. Follow-up Dr.Daldorf  and neurosurgical evaluation.. Follow-up Dr.Dumonski as needed  Continue physical therapy. Recommend massage and range of motion maneuvers as well as stretching maneuvers for treatment of pain of the thoracic region especially. as well as lumbar region and lower extremity region Recommend treatment 3 times per week for 6 weeks  F/U neurological evaluation. May consider pending follow-up evaluations  May consider radiofrequency rhizolysis or intraspinal procedures pending response to present treatment and F/U evaluation   Patient to call Pain Management Center should patient have concerns prior to scheduled return appointment.

## 2015-09-08 DIAGNOSIS — M25569 Pain in unspecified knee: Secondary | ICD-10-CM | POA: Diagnosis not present

## 2015-09-25 ENCOUNTER — Encounter: Payer: Self-pay | Admitting: Internal Medicine

## 2015-09-25 DIAGNOSIS — M25561 Pain in right knee: Secondary | ICD-10-CM | POA: Diagnosis not present

## 2015-09-25 DIAGNOSIS — M25562 Pain in left knee: Secondary | ICD-10-CM | POA: Diagnosis not present

## 2015-09-27 ENCOUNTER — Encounter: Payer: Self-pay | Admitting: Pain Medicine

## 2015-09-27 ENCOUNTER — Ambulatory Visit: Payer: Medicare Other | Attending: Pain Medicine | Admitting: Pain Medicine

## 2015-09-27 ENCOUNTER — Other Ambulatory Visit: Payer: Self-pay | Admitting: Internal Medicine

## 2015-09-27 VITALS — BP 122/73 | HR 59 | Temp 98.1°F | Resp 14 | Ht 65.0 in | Wt 204.0 lb

## 2015-09-27 DIAGNOSIS — M9420 Chondromalacia, unspecified site: Secondary | ICD-10-CM

## 2015-09-27 DIAGNOSIS — M1611 Unilateral primary osteoarthritis, right hip: Secondary | ICD-10-CM | POA: Insufficient documentation

## 2015-09-27 DIAGNOSIS — M47816 Spondylosis without myelopathy or radiculopathy, lumbar region: Secondary | ICD-10-CM | POA: Insufficient documentation

## 2015-09-27 DIAGNOSIS — M5134 Other intervertebral disc degeneration, thoracic region: Secondary | ICD-10-CM | POA: Insufficient documentation

## 2015-09-27 DIAGNOSIS — M4804 Spinal stenosis, thoracic region: Secondary | ICD-10-CM | POA: Insufficient documentation

## 2015-09-27 DIAGNOSIS — M62830 Muscle spasm of back: Secondary | ICD-10-CM | POA: Diagnosis not present

## 2015-09-27 DIAGNOSIS — M545 Low back pain: Secondary | ICD-10-CM | POA: Diagnosis present

## 2015-09-27 DIAGNOSIS — M5137 Other intervertebral disc degeneration, lumbosacral region: Secondary | ICD-10-CM

## 2015-09-27 DIAGNOSIS — M170 Bilateral primary osteoarthritis of knee: Secondary | ICD-10-CM | POA: Insufficient documentation

## 2015-09-27 DIAGNOSIS — G588 Other specified mononeuropathies: Secondary | ICD-10-CM

## 2015-09-27 DIAGNOSIS — M5136 Other intervertebral disc degeneration, lumbar region: Secondary | ICD-10-CM | POA: Diagnosis not present

## 2015-09-27 DIAGNOSIS — M546 Pain in thoracic spine: Secondary | ICD-10-CM | POA: Diagnosis present

## 2015-09-27 DIAGNOSIS — M79606 Pain in leg, unspecified: Secondary | ICD-10-CM | POA: Diagnosis present

## 2015-09-27 MED ORDER — TRAMADOL HCL 50 MG PO TABS: ORAL_TABLET | ORAL | 0 refills | Status: DC

## 2015-09-27 MED ORDER — HYDROCODONE-ACETAMINOPHEN 5-325 MG PO TABS
ORAL_TABLET | ORAL | 0 refills | Status: DC
Start: 2015-09-27 — End: 2015-10-17

## 2015-09-27 MED ORDER — LINACLOTIDE 145 MCG PO CAPS: 145.0000 ug | ORAL_CAPSULE | Freq: Every day | ORAL | 11 refills | Status: DC | PRN

## 2015-09-27 MED ORDER — HYDROCODONE-ACETAMINOPHEN 5-325 MG PO TABS: ORAL_TABLET | ORAL | 0 refills | Status: DC

## 2015-09-27 NOTE — Patient Instructions (Addendum)
PLAN   Continue present medication Norflex diclofenac gel tramadol and hydrocodone acetaminophen  Intercostal nerve blocks to be performed at time of return appointment  F/U PCP  Plotnikov for evaliation of  BP and general medical  condition  F/U surgical evaluation. Follow-up Dr.Daldorf  and neurosurgical evaluation.. Follow-up Dr.Dumonski as needed  Continue physical therapy. Recommend massage and range of motion maneuvers as well as stretching maneuvers for treatment of pain of the thoracic region especially. as well as lumbar region and lower extremity region Recommend treatment 3 times per week for 6 weeks  F/U neurological evaluation. May consider pending follow-up evaluations  May consider radiofrequency rhizolysis or intraspinal procedures pending response to present treatment and F/U evaluation   Patient to call Pain Management Center should patient have concerns prior to scheduled return appointment.  Intercostal Nerve Block Patient Information  Description: The twelve intercostal nerves arise from the first thru twelfth thoracic nerve roots.  The nerve begins at the spine and wraps around the body, lying in a groove underneath each rib.  Each intercostal nerve innervates a specific strip of skin and body walk of the abdomen and chest.  Therefore, injuries of the chest wall or abdominal wall result in pain that is transmitted back to the brian via the intercostal nerves.  Examples of such injuries include rib fractures and incisions for lung and gall bladder surgery.  Occasionally, pain may persist long after an injury or surgical incision secondary to inflammation and irritation of the intercostal nerve.  The longstanding pain is known as intercostal neuralgia.  An intercostal nerve block is preformed to eliminate pain either temporarily or permanently.  A small needle is placed below the rib and local anesthetic (like Novocaine) and possibly steroid is injected.  Usually 2-4  intercostal nerves are blocked at a time depending on the problem.  The patient will experience a slight "pin-prick" sensation for each injection.  Shortly thereafter, the strip of skin that is innervated by the blocked intercostal nerve will feel numb.  Persistent pain that is only temporarily relieved with local anesthetic may require a more permanent block. This procedure is called Cryoneurolysis and entails placing a small probe beneath the rib to freeze the nerve.  Conditions that may be treated by intercostal nerve blocks:   Rib fractures  Longstanding pain from surgery of the chest or abdomen (intercostal neuralgia)  Pain from chest tubes  Pain from trauma to the chest  Preparation for the injections:  1. Do not eat any solid food or dairy products within 8 hours of your appointment. 2. You may drink clear liquids up to 3 hours before appointment.  Clear liquids include water, black coffee, juice or soda.  No milk or cream please. 3. You may take your regular medication, including pain medications, with a sip of water before your appointment.  Diabetics should hold regular insulin (if take separately) and take 1/2 normal NPH dose the morning of the procedure.   Carry some sugar containing items with you to your appointment. 4. A driver must accompany you and be prepared to drive you home after your procedure. 5. Bring all your current medications with you. 6. An IV may be inserted and sedation may be given at the discretion of the physician. 7. A blood pressure cuff, EKG and other monitors will often be applied during the procedure.  Some patients may need to have extra oxygen administered for a short period. 8. You will be asked to provide medical information, including your  allergies, prior to the procedure.  We must know immediately if you are taking blood thinners (like Coumadin/Warfarin) or if you are allergic to IV iodine contrast (dye). We must know if you could possible be  pregnant.  Possible side-effects:   Bleeding from needle site  Infection (rare)  Nerve injury (rare)  Numbness & tingling of skin  Collapsed lung requiring chest tube (rare)  Local anesthetic toxicity (rare)  Light-headedness (temporary)  Pain at injection site (several days)  Decreased blood pressure (temporary)  Shortness of breath  Jittery/shaking sensation (temporary)  Call if you experience:   Difficulty breathing or hives (go directly to the emergency room)  Redness, inflammation or drainage at the injection site  Severe pain at the site of the injection  Any new symptoms which are concerning   Please note:  Your pain may subside immediately but may return several hours after the injection.  Often, more than one injection is required to reduce the pain. Also, if several temporary blocks with local anesthetic are ineffective, a more permanent block with cryolysis may be necessary.  This will be discussed with you should this be the case.  If you have any questions, please call [phone] [facility]

## 2015-09-27 NOTE — Progress Notes (Signed)
The patient is a 45 year old female who returns to pain management for further evaluation and treatment of pain involving the upper mid lower back and lower extremity regions.. The patient states she has had some return of pain involving the mid back region with significant muscle spasms occurring in the mid back region. We discussed patient's condition and will continue present medications consisting of Norflex tramadol hydrocodone acetaminophen and will proceed with intercostal nerve blocks to be performed at time of return appointment. The patient denied any trauma change in events of daily living the call significant change in symptomatology. The patient was with understanding and agreement suggested treatment plan. The patient stated the pain increased with region lifting pushing pulling maneuvers as well as twisting and turning maneuvers. The patient stated the pain also increased with standing for periods of time and was associated with significant muscle spasms.    Physical examination  There was tenderness to palpation of the paraspinal muscles region cervical region cervical facet region palpation which be produced pain of moderate degree with moderate tenderness of the splenius capitis and occipitalis regions. Palpation over the region of the cervical facet cervical paraspinal musculature region reproduced pain of mild degree with moderate tenderness to palpation over the thoracic facet thoracic paraspinal musculature region. There was moderately severe muscle spasms involving the trapezius levator scapula and rhomboid musculature regions. No crepitus of the thoracic region was noted. There was tends to palpation over the lumbar paraspinal musculatures and lumbar facet region with lateral bending rotation extension and palpation of the lumbar facets reproducing moderate discomfort. Palpation of the PSIS PII S region as well as the gluteal and piriformis musculature regions reproduced pain of  moderate degree. Straight leg raising was tolerates approximately 30 without increased pain with dorsiflexion noted. The knees were with tenderness to palpation with crepitus of the knees with well-healed surgical scars of the knees without increased warmth erythema of the knees. No ballottement of the patella the knees were noted. There was significant pain with range of motion maneuvers of the knees. There was negative anterior and posterior drawer signs. No sensory deficit of dermatomal distribution of the lower extremities was noted. There was negative clonus negative Homans. Abdomen was nontender with no costovertebral angle tenderness noted.     Assessment    Degenerative joint disease of knees  Degenerative disc disease of the thoracic spine 1. Stable thoracic spine since 2014. No acute osseous abnormality. 2. Mild disc degeneration most pronounced at T10-T11 contributing to mild spinal stenosis at that level. Stable minimal cord mass effect with no cord signal abnormality. 3. 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 Follow-up Dr.Daldorf  and neurosurgical evaluation.. Follow-up Dr.Dumonski as needed  Continue physical therapy. Recommend massage and range of motion maneuvers as well as stretching maneuvers for treatment of pain of the thoracic region especially. as well as lumbar region and  lower extremity region Recommend treatment 3 times per week for 6 weeks  F/U neurological evaluation. May consider pending follow-up evaluations  May consider radiofrequency rhizolysis or intraspinal procedures pending response to present treatment and F/U evaluation   Patient to call Pain Management Center should patient have concerns prior to scheduled return appointment.

## 2015-10-05 ENCOUNTER — Encounter: Payer: Self-pay | Admitting: Pain Medicine

## 2015-10-10 ENCOUNTER — Telehealth: Payer: Self-pay | Admitting: *Deleted

## 2015-10-11 DIAGNOSIS — M222X1 Patellofemoral disorders, right knee: Secondary | ICD-10-CM | POA: Diagnosis not present

## 2015-10-12 ENCOUNTER — Other Ambulatory Visit: Payer: Self-pay | Admitting: Orthopaedic Surgery

## 2015-10-13 NOTE — H&P (Signed)
[name] is an 45 y.o. female.    Chief Complaint: right knee pain  HPI: [name] continues with knee pain worst on the right. She has trouble kneeling and climbing stairs. This can wake her from sleep. We gave her some injections at the last visit which at most helped temporarily. She continues to exercise on an elliptical machine but has trouble with pain even doing that exercise. She remains out of work on disability but would like to be able to do something and she says her knees keep her from any meaningful work. Her pain is constant and severe. We performed arthroscopies on both knees a year ago and they revealed patellofemoral issues only.   Past Medical History:  Diagnosis Date  . Anxiety state, unspecified   . Attention deficit disorder without mention of hyperactivity   . Depressive disorder, not elsewhere classified   . Esophageal reflux   . Hypertension   . Migraine without aura, without mention of intractable migraine without mention of status migrainosus   . Pain in joint, site unspecified   . Unspecified asthma(493.90)   . Unspecified essential hypertension   . Unspecified vitamin D deficiency     Past Surgical History:  Procedure Laterality Date  . ABDOMINAL HYSTERECTOMY    . abdominal ultrasound  10/12/97  . APPENDECTOMY  05/27/2013   gangrenous  . INCISE AND DRAIN ABCESS  2011   Right axilla- Dr. Barkley Bruns  . KNEE ARTHROSCOPY     both knees  . NM MYOCAR PERF WALL MOTION  11/2009   bruce myoview; perfusion defect in anterior region consistent with breast attenuation; remaining myocardium with normal perfusion; post-stress EF 74%; low risk scan   . PARTIAL HYSTERECTOMY  2007  . PLANTAR FASCIA SURGERY  2000  . TONSILLECTOMY  04/29/00  . TONSILLECTOMY Bilateral   . TRANSTHORACIC ECHOCARDIOGRAM  11/2009   EF=>55%; trace MR & TR    Family History  Problem Relation Age of Onset  . Breast cancer Mother   . Multiple sclerosis Mother   . Cancer Mother 75   breast ca  . Depression Mother   . Lupus Sister     PTSD  . Alcohol abuse Sister   . Bipolar disorder Sister   . Heart disease Father     cardiac arrest  . Alcohol abuse Brother     also HTN, lupus, enlarged heart  . Heart disease Maternal Grandfather     cardiac arrest  . Diabetes Paternal Grandfather   . Heart disease Paternal Grandfather   . Asthma      Family history  . Coronary artery disease      1st degree female < 50  . Diabetes      1st degree relative  . COPD Paternal Grandmother   . ADD / ADHD Child   . Asthma Child    Social History:  reports that she has been smoking Cigarettes.  She started smoking about 28 years ago. She has a 27.00 pack-year smoking history. She has never used smokeless tobacco. She reports that she does not drink alcohol or use drugs.  Allergies:  Allergies  Allergen Reactions  . Amoxicillin Itching    REACTION: QUESTIONABLE  . Ancef [Cefazolin] Itching  . Doxycycline Nausea And Vomiting    REACTION: vomiting  . Hydrochlorothiazide     REACTION: CRAMPS AT HIGHER DOSAGES  . Lisinopril     Other reaction(s): Cough (finding) REACTION: COUGH  . Penicillins   . Percocet [Oxycodone-Acetaminophen] Itching  itching  . Talwin [Pentazocine] Nausea And Vomiting    n/v    No prescriptions prior to admission.    No results found for this or any previous visit (from the past 48 hour(s)). No results found.  Review of Systems  Musculoskeletal: Positive for joint pain.       Right knee  All other systems reviewed and are negative.   There were no vitals taken for this visit. Physical Exam  Constitutional: She is oriented to person, place, and time. She appears well-developed and well-nourished.  HENT:  Head: Normocephalic and atraumatic.  Eyes: Pupils are equal, round, and reactive to light.  Neck: Normal range of motion.  Cardiovascular: Normal rate.   Respiratory: Effort normal.  GI: Soft.  Musculoskeletal:  Both knees move well.  She has medial patellar facet pain on both sides. Don't feel an effusion on either side and there is no crepitation. Ligaments feel stable.  Hip motion is full and pain free and SLR is negative on both sides.  There is no palpable LAD behind either knee.  Sensation and motor function are intact on both sides and there are palpable pulses on both sides.   Neurological: She is alert and oriented to person, place, and time.  Skin: Skin is warm and dry.  Psychiatric: She has a normal mood and affect. Her behavior is normal. Judgment and thought content normal.     Assessment/Plan Assessment: Right greater than left chondromalacia patella with arthroscopy 2016 and Synvisc 2017  Plan: [name] continues with some disabling pain especially on the right. She's been through arthroscopy and physical therapy and multiple injections. She's tried braces and different pills. She has difficulty related to patellofemoral pain. I have gone over at this point a patellofemoral realignment designed to elevate her patella somewhat. We will perform a Fulkerson slide with a bit of an anterior tilt. I reviewed risk of anesthesia and infection as well as DVT. I stressed the importance of postoperative rehabilitation to optimize her result.   [name], Larwance Sachs, PA-C 10/13/2015, 11:43 AM

## 2015-10-16 ENCOUNTER — Encounter (HOSPITAL_COMMUNITY): Payer: Self-pay | Admitting: *Deleted

## 2015-10-16 NOTE — Progress Notes (Signed)
Spoke with pt for pre-op call. Pt denies cardiac history, chest pain or sob. 

## 2015-10-17 ENCOUNTER — Encounter (HOSPITAL_COMMUNITY)
Admission: RE | Disposition: A | Discharge status: Home / Self Care | Payer: Self-pay | Source: Ambulatory Visit | Attending: Orthopaedic Surgery

## 2015-10-17 ENCOUNTER — Ambulatory Visit (HOSPITAL_COMMUNITY): Payer: Medicare Other | Admitting: Anesthesiology

## 2015-10-17 ENCOUNTER — Ambulatory Visit (HOSPITAL_COMMUNITY)
Admission: RE | Admit: 2015-10-17 | Discharge: 2015-10-17 | Disposition: A | Discharge status: Home / Self Care | Payer: Medicare Other | Source: Ambulatory Visit | Attending: Orthopaedic Surgery | Admitting: Orthopaedic Surgery

## 2015-10-17 ENCOUNTER — Encounter (HOSPITAL_COMMUNITY): Payer: Self-pay | Admitting: Urology

## 2015-10-17 DIAGNOSIS — Z7951 Long term (current) use of inhaled steroids: Secondary | ICD-10-CM | POA: Insufficient documentation

## 2015-10-17 DIAGNOSIS — Z79899 Other long term (current) drug therapy: Secondary | ICD-10-CM | POA: Diagnosis not present

## 2015-10-17 DIAGNOSIS — F17210 Nicotine dependence, cigarettes, uncomplicated: Secondary | ICD-10-CM | POA: Diagnosis not present

## 2015-10-17 DIAGNOSIS — J45909 Unspecified asthma, uncomplicated: Secondary | ICD-10-CM | POA: Insufficient documentation

## 2015-10-17 DIAGNOSIS — I10 Essential (primary) hypertension: Secondary | ICD-10-CM | POA: Insufficient documentation

## 2015-10-17 DIAGNOSIS — F319 Bipolar disorder, unspecified: Secondary | ICD-10-CM | POA: Diagnosis not present

## 2015-10-17 DIAGNOSIS — F988 Other specified behavioral and emotional disorders with onset usually occurring in childhood and adolescence: Secondary | ICD-10-CM | POA: Diagnosis not present

## 2015-10-17 DIAGNOSIS — K219 Gastro-esophageal reflux disease without esophagitis: Secondary | ICD-10-CM | POA: Insufficient documentation

## 2015-10-17 DIAGNOSIS — M2241 Chondromalacia patellae, right knee: Secondary | ICD-10-CM | POA: Insufficient documentation

## 2015-10-17 DIAGNOSIS — M94261 Chondromalacia, right knee: Secondary | ICD-10-CM | POA: Diagnosis not present

## 2015-10-17 DIAGNOSIS — Z7982 Long term (current) use of aspirin: Secondary | ICD-10-CM | POA: Insufficient documentation

## 2015-10-17 HISTORY — DX: Secondary polycythemia: D75.1

## 2015-10-17 HISTORY — DX: Pneumonia, unspecified organism: J18.9

## 2015-10-17 HISTORY — DX: Bipolar disorder, unspecified: F31.9

## 2015-10-17 HISTORY — PX: KNEE ARTHROSCOPY WITH FULKERSON SLIDE: SHX5648

## 2015-10-17 HISTORY — DX: Unspecified osteoarthritis, unspecified site: M19.90

## 2015-10-17 HISTORY — DX: Constipation, unspecified: K59.00

## 2015-10-17 LAB — BASIC METABOLIC PANEL
Anion gap: 8 (ref 5–15)
BUN: 13 mg/dL (ref 6–20)
CHLORIDE: 105 mmol/L (ref 101–111)
CO2: 23 mmol/L (ref 22–32)
CREATININE: 0.53 mg/dL (ref 0.44–1.00)
Calcium: 9 mg/dL (ref 8.9–10.3)
Glucose, Bld: 94 mg/dL (ref 65–99)
POTASSIUM: 4.3 mmol/L (ref 3.5–5.1)
SODIUM: 136 mmol/L (ref 135–145)

## 2015-10-17 LAB — CBC
HEMATOCRIT: 42.3 % (ref 36.0–46.0)
Hemoglobin: 14.4 g/dL (ref 12.0–15.0)
MCH: 31.2 pg (ref 26.0–34.0)
MCHC: 34 g/dL (ref 30.0–36.0)
MCV: 91.6 fL (ref 78.0–100.0)
PLATELETS: 211 10*3/uL (ref 150–400)
RBC: 4.62 MIL/uL (ref 3.87–5.11)
RDW: 12.6 % (ref 11.5–15.5)
WBC: 9.6 10*3/uL (ref 4.0–10.5)

## 2015-10-17 SURGERY — ARTHROSCOPY, KNEE, WITH FULKERSON OSTEOTOMY
Anesthesia: General | Laterality: Right

## 2015-10-17 MED ORDER — DEXAMETHASONE SODIUM PHOSPHATE 10 MG/ML IJ SOLN
INTRAMUSCULAR | Status: DC | PRN
  Administered 2015-10-17: 10 mg via INTRAVENOUS

## 2015-10-17 MED ORDER — PROPOFOL 10 MG/ML IV BOLUS
INTRAVENOUS | Status: DC | PRN
  Administered 2015-10-17: 200 mg via INTRAVENOUS

## 2015-10-17 MED ORDER — ONDANSETRON HCL 4 MG/2ML IJ SOLN
INTRAMUSCULAR | Status: AC
  Filled 2015-10-17: qty 2

## 2015-10-17 MED ORDER — HYDROMORPHONE HCL 1 MG/ML IJ SOLN
INTRAMUSCULAR | Status: AC
  Filled 2015-10-17: qty 1

## 2015-10-17 MED ORDER — HYDROCODONE-ACETAMINOPHEN 7.5-325 MG PO TABS
ORAL_TABLET | ORAL | Status: AC
  Filled 2015-10-17: qty 2

## 2015-10-17 MED ORDER — BUPIVACAINE-EPINEPHRINE (PF) 0.5% -1:200000 IJ SOLN
INTRAMUSCULAR | Status: AC
  Filled 2015-10-17: qty 30

## 2015-10-17 MED ORDER — FENTANYL CITRATE (PF) 100 MCG/2ML IJ SOLN
INTRAMUSCULAR | Status: DC | PRN
  Administered 2015-10-17 (×2): 50 ug via INTRAVENOUS

## 2015-10-17 MED ORDER — SODIUM CHLORIDE 0.9 % IR SOLN
Status: DC | PRN
  Administered 2015-10-17: 3000 mL

## 2015-10-17 MED ORDER — HYDROMORPHONE HCL 1 MG/ML IJ SOLN
0.2500 mg | INTRAMUSCULAR | Status: DC | PRN
  Administered 2015-10-17 (×3): 0.5 mg via INTRAVENOUS

## 2015-10-17 MED ORDER — MIDAZOLAM HCL 2 MG/2ML IJ SOLN
INTRAMUSCULAR | Status: AC
  Filled 2015-10-17: qty 2

## 2015-10-17 MED ORDER — PROPOFOL 10 MG/ML IV BOLUS
INTRAVENOUS | Status: AC
  Filled 2015-10-17: qty 20

## 2015-10-17 MED ORDER — CHLORHEXIDINE GLUCONATE 4 % EX LIQD: 60.0000 mL | Freq: Once | CUTANEOUS | Status: DC

## 2015-10-17 MED ORDER — LIDOCAINE 2% (20 MG/ML) 5 ML SYRINGE
INTRAMUSCULAR | Status: AC
  Filled 2015-10-17: qty 5

## 2015-10-17 MED ORDER — ONDANSETRON HCL 4 MG/2ML IJ SOLN
INTRAMUSCULAR | Status: DC | PRN
  Administered 2015-10-17: 4 mg via INTRAVENOUS

## 2015-10-17 MED ORDER — FENTANYL CITRATE (PF) 100 MCG/2ML IJ SOLN
INTRAMUSCULAR | Status: AC
  Filled 2015-10-17: qty 2

## 2015-10-17 MED ORDER — MIDAZOLAM HCL 5 MG/5ML IJ SOLN
INTRAMUSCULAR | Status: DC | PRN
  Administered 2015-10-17: 2 mg via INTRAVENOUS

## 2015-10-17 MED ORDER — LACTATED RINGERS IV SOLN: INTRAVENOUS | Status: DC

## 2015-10-17 MED ORDER — BUPIVACAINE-EPINEPHRINE 0.5% -1:200000 IJ SOLN
INTRAMUSCULAR | Status: DC | PRN
  Administered 2015-10-17: 30 mL

## 2015-10-17 MED ORDER — LIDOCAINE 2% (20 MG/ML) 5 ML SYRINGE
INTRAMUSCULAR | Status: DC | PRN
  Administered 2015-10-17: 60 mg via INTRAVENOUS

## 2015-10-17 MED ORDER — PHENYLEPHRINE 40 MCG/ML (10ML) SYRINGE FOR IV PUSH (FOR BLOOD PRESSURE SUPPORT)
PREFILLED_SYRINGE | INTRAVENOUS | Status: AC
  Filled 2015-10-17: qty 10

## 2015-10-17 MED ORDER — CLINDAMYCIN PHOSPHATE 900 MG/50ML IV SOLN
900.0000 mg | INTRAVENOUS | Status: AC
  Administered 2015-10-17: 900 mg via INTRAVENOUS
  Filled 2015-10-17: qty 50

## 2015-10-17 MED ORDER — HYDROCODONE-ACETAMINOPHEN 7.5-325 MG PO TABS: 1.0000 | ORAL_TABLET | ORAL | 0 refills | Status: DC | PRN

## 2015-10-17 MED ORDER — LACTATED RINGERS IV SOLN
INTRAVENOUS | Status: DC
  Administered 2015-10-17 (×2): via INTRAVENOUS

## 2015-10-17 MED ORDER — HYDROCODONE-ACETAMINOPHEN 7.5-325 MG PO TABS
2.0000 | ORAL_TABLET | Freq: Once | ORAL | Status: AC
  Administered 2015-10-17: 2 via ORAL

## 2015-10-17 SURGICAL SUPPLY — 43 items
BANDAGE ACE 6X5 VEL STRL LF (GAUZE/BANDAGES/DRESSINGS) ×2 IMPLANT
BIT DRILL QC 2.7MM (BIT) IMPLANT
BLADE GREAT WHITE 4.2 (BLADE) ×2 IMPLANT
BLADE SURG 11 STRL SS (BLADE) IMPLANT
BNDG GAUZE ELAST 4 BULKY (GAUZE/BANDAGES/DRESSINGS) ×2 IMPLANT
COVER SURGICAL LIGHT HANDLE (MISCELLANEOUS) ×2 IMPLANT
CUFF TOURNIQUET SINGLE 34IN LL (TOURNIQUET CUFF) ×2 IMPLANT
CUFF TOURNIQUET SINGLE 44IN (TOURNIQUET CUFF) IMPLANT
DRAPE ARTHROSCOPY W/POUCH 114 (DRAPES) ×2 IMPLANT
DRAPE U-SHAPE 47X51 STRL (DRAPES) ×2 IMPLANT
DRSG EMULSION OIL 3X3 NADH (GAUZE/BANDAGES/DRESSINGS) ×2 IMPLANT
DRSG PAD ABDOMINAL 8X10 ST (GAUZE/BANDAGES/DRESSINGS) ×2 IMPLANT
DURAPREP 26ML APPLICATOR (WOUND CARE) ×2 IMPLANT
GAUZE SPONGE 4X4 12PLY STRL (GAUZE/BANDAGES/DRESSINGS) ×2 IMPLANT
GLOVE BIO SURGEON STRL SZ8 (GLOVE) ×8 IMPLANT
GLOVE BIOGEL PI IND STRL 8 (GLOVE) ×1 IMPLANT
GLOVE BIOGEL PI INDICATOR 8 (GLOVE) ×1
GOWN STRL REUS W/ TWL LRG LVL3 (GOWN DISPOSABLE) ×3 IMPLANT
GOWN STRL REUS W/ TWL XL LVL3 (GOWN DISPOSABLE) ×3 IMPLANT
GOWN STRL REUS W/TWL 2XL LVL3 (GOWN DISPOSABLE) ×2 IMPLANT
GOWN STRL REUS W/TWL LRG LVL3 (GOWN DISPOSABLE) ×6
GOWN STRL REUS W/TWL XL LVL3 (GOWN DISPOSABLE) ×6
KIT ROOM TURNOVER OR (KITS) ×2 IMPLANT
MANIFOLD NEPTUNE II (INSTRUMENTS) ×2 IMPLANT
NDL 18GX1X1/2 (RX/OR ONLY) (NEEDLE) ×1 IMPLANT
NDL SPNL 18GX3.5 QUINCKE PK (NEEDLE) IMPLANT
NEEDLE 18GX1X1/2 (RX/OR ONLY) (NEEDLE) ×2 IMPLANT
NEEDLE 22X1 1/2 (OR ONLY) (NEEDLE) IMPLANT
NEEDLE SPNL 18GX3.5 QUINCKE PK (NEEDLE) IMPLANT
PACK ARTHROSCOPY DSU (CUSTOM PROCEDURE TRAY) ×2 IMPLANT
PAD ARMBOARD 7.5X6 YLW CONV (MISCELLANEOUS) ×4 IMPLANT
PROS DRILL BIT QC 2.7MM (BIT) ×2
SCREW CANCEL 4.0 10MM (Screw) ×1 IMPLANT
SCREW CANCEL 4.0 12MM (Screw) ×1 IMPLANT
SET ARTHROSCOPY TUBING (MISCELLANEOUS) ×2
SET ARTHROSCOPY TUBING LN (MISCELLANEOUS) ×1 IMPLANT
SUT MNCRL AB 3-0 PS2 18 (SUTURE) ×1 IMPLANT
SUT VIC AB 0 CT1 27 (SUTURE) ×2
SUT VIC AB 0 CT1 27XBRD ANBCTR (SUTURE) ×1 IMPLANT
SUT VIC AB 2-0 CTB1 (SUTURE) ×1 IMPLANT
SYR CONTROL 10ML LL (SYRINGE) ×2 IMPLANT
TOWEL OR 17X24 6PK STRL BLUE (TOWEL DISPOSABLE) ×4 IMPLANT
WATER STERILE IRR 1000ML POUR (IV SOLUTION) ×1 IMPLANT

## 2015-10-17 NOTE — Transfer of Care (Signed)
Immediate Anesthesia Transfer of Care Note  Patient: [name]  Procedure(s) Performed: Procedure(s): KNEE ARTHROSCOPY WITH FULKERSON SLIDE (Right)  Patient Location: PACU  Anesthesia Type:General  Level of Consciousness: responds to stimulation  Airway & Oxygen Therapy: Patient Spontanous Breathing and Patient connected to face mask oxygen  Post-op Assessment: Report given to RN and Post -op Vital signs reviewed and stable  Post vital signs: Reviewed and stable  Last Vitals:  Vitals:   10/17/15 0808  BP: 127/69  Pulse: 71  Resp: 16  Temp: 36.7 C    Last Pain:  Vitals:   10/17/15 0808  TempSrc: Oral         Complications: No apparent anesthesia complications

## 2015-10-17 NOTE — Op Note (Signed)
#  [id number] 

## 2015-10-17 NOTE — Anesthesia Postprocedure Evaluation (Signed)
Anesthesia Post Note  Patient: [name]  Procedure(s) Performed: Procedure(s) (LRB): KNEE ARTHROSCOPY WITH FULKERSON SLIDE (Right)  Patient location during evaluation: PACU Anesthesia Type: General Level of consciousness: awake and alert Pain management: pain level controlled Vital Signs Assessment: post-procedure vital signs reviewed and stable Respiratory status: spontaneous breathing, nonlabored ventilation and respiratory function stable Cardiovascular status: blood pressure returned to baseline and stable Postop Assessment: no signs of nausea or vomiting Anesthetic complications: no    Last Vitals:  Vitals:   10/17/15 1200 10/17/15 1230  BP: 118/68 108/74  Pulse:    Resp:    Temp:      Last Pain:  Vitals:   10/17/15 1215  TempSrc:   PainSc: 4                  [name],W. EDMOND

## 2015-10-17 NOTE — Interval H&P Note (Signed)
History and Physical Interval Note:  10/17/2015 9:16 AM  [name]  has presented today for surgery, with the diagnosis of PATELLOFEMORAL DISORDER OF RIGHT KNEE  The various methods of treatment have been discussed with the patient and family. After consideration of risks, benefits and other options for treatment, the patient has consented to  Procedure(s): KNEE ARTHROSCOPY WITH FULKERSON SLIDE (Right) as a surgical intervention .  The patient's history has been reviewed, patient examined, no change in status, stable for surgery.  I have reviewed the patient's chart and labs.  Questions were answered to the patient's satisfaction.     [name]

## 2015-10-17 NOTE — OR Nursing (Signed)
Wasted 0.5mg  of Dilaudid with Engineer, materials. Waste in sharps

## 2015-10-17 NOTE — Anesthesia Preprocedure Evaluation (Addendum)
Anesthesia Evaluation  Patient identified by MRN, date of birth, ID band Patient awake    Reviewed: Allergy & Precautions, H&P , NPO status , Patient's Chart, lab work & pertinent test results, reviewed documented beta blocker date and time   Airway Mallampati: II  TM Distance: >3 FB Neck ROM: Full    Dental no notable dental hx. (+) Teeth Intact, Dental Advisory Given   Pulmonary asthma , Current Smoker,    Pulmonary exam normal breath sounds clear to auscultation       Cardiovascular hypertension, Pt. on medications and Pt. on home beta blockers  Rhythm:Regular Rate:Normal     Neuro/Psych  Headaches, Anxiety Depression Bipolar Disorder    GI/Hepatic Neg liver ROS, GERD  Medicated and Controlled,  Endo/Other  negative endocrine ROS  Renal/GU negative Renal ROS  negative genitourinary   Musculoskeletal  (+) Arthritis , Osteoarthritis,    Abdominal   Peds  Hematology negative hematology ROS (+)   Anesthesia Other Findings   Reproductive/Obstetrics negative OB ROS                            Anesthesia Physical Anesthesia Plan  ASA: III  Anesthesia Plan: General   Post-op Pain Management:    Induction: Intravenous  Airway Management Planned: LMA  Additional Equipment:   Intra-op Plan:   Post-operative Plan: Extubation in OR  Informed Consent: I have reviewed the patients History and Physical, chart, labs and discussed the procedure including the risks, benefits and alternatives for the proposed anesthesia with the patient or authorized representative who has indicated his/her understanding and acceptance.   Dental advisory given  Plan Discussed with: CRNA  Anesthesia Plan Comments:         Anesthesia Quick Evaluation

## 2015-10-17 NOTE — Progress Notes (Signed)
Orthopedic Tech Progress Note Patient Details:  [name] April 23, 1970 [id number]  Ortho Devices Type of Ortho Device: Crutches Ortho Device/Splint Interventions: Ordered, Adjustment   Braulio Bosch 10/17/2015, 12:20 PM

## 2015-10-17 NOTE — Anesthesia Procedure Notes (Signed)
Procedure Name: LMA Insertion Date/Time: 10/17/2015 10:09 AM Performed by: Manuela Schwartz B Pre-anesthesia Checklist: Patient identified, Emergency Drugs available, Suction available, Patient being monitored and Timeout performed Patient Re-evaluated:Patient Re-evaluated prior to inductionOxygen Delivery Method: Circle system utilized Preoxygenation: Pre-oxygenation with 100% oxygen Intubation Type: IV induction LMA: LMA inserted LMA Size: 4.0 Number of attempts: 1 Placement Confirmation: positive ETCO2 and breath sounds checked- equal and bilateral Tube secured with: Tape Dental Injury: Teeth and Oropharynx as per pre-operative assessment

## 2015-10-18 ENCOUNTER — Encounter (HOSPITAL_COMMUNITY): Payer: Self-pay | Admitting: Orthopaedic Surgery

## 2015-10-18 NOTE — Op Note (Signed)
NAMECRYSTINA, [name] NO.:  [id number]  MEDICAL RECORD NO.:  [id number]  LOCATION:  [facility]                         FACILITY:  [facility]  PHYSICIAN:  Monico Blitz. Carolee Channell, M.D.DATE OF BIRTH:  28-May-1970  DATE OF PROCEDURE:  10/17/2015 DATE OF DISCHARGE:  10/17/2015                              OPERATIVE REPORT   PREOPERATIVE DIAGNOSIS:  Right knee chondromalacia.  POSTOPERATIVE DIAGNOSIS:  Right knee chondromalacia.  PROCEDURES: 1. Right knee arthroscopic chondroplasty. 2. Right knee arthroscopic lateral release. 3. Right knee Fulkerson slide realignment.  ANESTHESIA:  General.  ATTENDING SURGEON:  Monico Blitz. Rhona Raider, M.D.  ASSISTANT:  Loni Dolly, PA.  INDICATION FOR PROCEDURE:  The patient is a 45 year old woman with a long history of anterior knee pain.  This has persisted despite physical therapy, taping, medications, and an arthroscopy about a year ago which was notable for patellofemoral wear.  She has pain which limits her ability to climb stairs and squat and kneel.  She is offered a realignment procedure in hopes of elevating her patella somewhat and decompressing this joint.  Informed operative consent was obtained after discussion of possible complications including reaction to anesthesia, infection, and DVT.  A success rate of about 2 and 3 was quoted to the patient but together we have decided this was probably her best option at this point.  SUMMARY OF FINDINGS AND PROCEDURE:  Under general anesthesia, a right knee arthroscopy was performed.  Findings were similar to the scope about a year ago with some breakdown patellofemoral and some lateral tilt and subluxation in the patellofemoral joint.  Medial and lateral compartments were benign and ACL was normal.  Arthroscopically, I performed a debridement of the patellofemoral joint which involves only the undersurface of the patella.  The intertrochlear groove was completely benign.  I then performed  an arthroscopic lateral release. We then performed the open portion of the procedure which consisted of a Fulkerson slide with cut L angled about 45 degrees to elevate the tibial tubercle.  This was then stabilized with 2 Synthes small fragment set screws which were 4.0 mm partially threaded cancellous screws. Fluoroscopy was used to confirm adequate placement of hardware and I read these views myself.  Loni Dolly, assisted throughout and was invaluable to the completion of the case mostly in that he helped maintain reduction while I placed hardware.  He also closed simultaneously to help minimize OR time.  DESCRIPTION OF PROCEDURE:  The patient was taken to the operating suite where general anesthetic was applied without difficulty.  She was positioned supine and prepped and draped in normal sterile fashion. After the administration of IV clindamycin and appropriate time out, the right leg was elevated, exsanguinated, and tourniquet inflated about the thigh.  I performed an arthroscopy with findings as noted above. Procedure consisted of chondroplasty with abrasion to bleeding bone in one small area on the undersurface of the patella.  I then performed an arthroscopic lateral release through the additional superolateral portal.  We then made a small incision anteromedial from the tibial tubercle distal.  Dissection was carried down to the patellar tendon attachment site.  I then used an oscillating saw to make a cut which  tapered distally, but I did my best to maintain periosteal sleeve at that location.  We then elevated this.  The cut was angled about 45 degrees so as the tibial tubercle was taken in a medial direction and was also taken in the anterior direction.  Displaced this about 8 or 9 mm.  We then placed 2 of the aforementioned screws through the tibial tubercle to secure our osteotomy in appropriate position.  I used fluoroscopy to confirm adequate placement of hardware.   One of these screws did engage the posterior cortex slightly and the other one was left slightly shy of that.  I felt the fixation was good.  The wound was irrigated.  We did inflate the tourniquet at the beginning of the case and this was deflated at this point.  A small amount of bleeding was easily controlled with some pressure.  We then irrigated again, and I placed the scope back in the knee.  The patella tracked in a normal position.  I came down into the intertrochlear groove completely at 60 degrees of flexion.  Arthroscopic equipment was removed.  Deep tissues were reapproximated at our incision with 0 Vicryl followed by 2-0 undyed Vicryl in a subcuticular stitch with some loose Steri-Strips.  We injected some Marcaine about the incision site and into the knee. Adaptic was applied followed by dry gauze and loose Ace wrap with a knee immobilizer.  Estimated blood loss and fluids obtained can be obtained from anesthesia records as can accurate tourniquet time.  DISPOSITION:  The patient was extubated in the operating room and taken to recovery room in stable condition.  She was to go home same day and follow up in the office closely.  I will contact her by phone tonight.     Monico Blitz Rhona Raider, M.D.     PGD/MEDQ  D:  10/17/2015  T:  10/18/2015  Job:  [id number]

## 2015-10-27 DIAGNOSIS — M94261 Chondromalacia, right knee: Secondary | ICD-10-CM | POA: Diagnosis not present

## 2015-10-30 ENCOUNTER — Other Ambulatory Visit: Payer: Self-pay | Admitting: *Deleted

## 2015-10-30 NOTE — Telephone Encounter (Signed)
Rec'd fax pt requesting 90 day on her Meloxicam. Faxed form back stating DENIED per chart med was D/C @ hospital discharge...[name]

## 2015-11-02 ENCOUNTER — Ambulatory Visit: Payer: Self-pay | Admitting: Pain Medicine

## 2015-11-03 DIAGNOSIS — M25561 Pain in right knee: Secondary | ICD-10-CM | POA: Diagnosis not present

## 2015-11-03 DIAGNOSIS — M25661 Stiffness of right knee, not elsewhere classified: Secondary | ICD-10-CM | POA: Diagnosis not present

## 2015-11-06 ENCOUNTER — Ambulatory Visit (INDEPENDENT_AMBULATORY_CARE_PROVIDER_SITE_OTHER): Payer: Medicare Other | Admitting: Internal Medicine

## 2015-11-06 ENCOUNTER — Encounter: Payer: Self-pay | Admitting: Internal Medicine

## 2015-11-06 ENCOUNTER — Ambulatory Visit: Payer: Medicare Other | Admitting: Internal Medicine

## 2015-11-06 DIAGNOSIS — F319 Bipolar disorder, unspecified: Secondary | ICD-10-CM

## 2015-11-06 DIAGNOSIS — F988 Other specified behavioral and emotional disorders with onset usually occurring in childhood and adolescence: Secondary | ICD-10-CM | POA: Diagnosis not present

## 2015-11-06 DIAGNOSIS — J4520 Mild intermittent asthma, uncomplicated: Secondary | ICD-10-CM | POA: Diagnosis not present

## 2015-11-06 DIAGNOSIS — I10 Essential (primary) hypertension: Secondary | ICD-10-CM | POA: Diagnosis not present

## 2015-11-06 DIAGNOSIS — K5909 Other constipation: Secondary | ICD-10-CM

## 2015-11-06 DIAGNOSIS — D751 Secondary polycythemia: Secondary | ICD-10-CM

## 2015-11-06 DIAGNOSIS — E559 Vitamin D deficiency, unspecified: Secondary | ICD-10-CM

## 2015-11-06 NOTE — Progress Notes (Signed)
Pre visit review using our clinic review tool, if applicable. No additional management support is needed unless otherwise documented below in the visit note. 

## 2015-11-06 NOTE — Assessment & Plan Note (Signed)
Linzess 

## 2015-11-06 NOTE — Assessment & Plan Note (Signed)
Chronic  Vyvance

## 2015-11-06 NOTE — Assessment & Plan Note (Signed)
On Saphris, Cymbalta, Cogentin

## 2015-11-06 NOTE — Assessment & Plan Note (Signed)
Proair prn

## 2015-11-06 NOTE — Progress Notes (Signed)
Subjective:  Patient ID: [name], female    DOB: 1971-01-09  Age: 45 y.o. MRN: [id number]  CC: Follow-up (3 month follow up)   HPI LANAI LEFFERS presents for asthma, HTN, bipolar disorder. She is s/p R knee surgery in 9/17. C/o URI's since Sat  Outpatient Medications Prior to Visit  Medication Sig Dispense Refill  . albuterol (PROAIR HFA) 108 (90 Base) MCG/ACT inhaler Inhale 2 puffs into the lungs every 4 (four) hours as needed. For shortness of breath. 18 g 2  . albuterol (PROVENTIL) (2.5 MG/3ML) 0.083% nebulizer solution Take 3 mLs (2.5 mg total) by nebulization every 6 (six) hours as needed. For shortness of breath. 75 mL 0  . Asenapine Maleate (SAPHRIS) 10 MG SUBL Place under the tongue at bedtime.    [name] aspirin (BAYER ASPIRIN) 325 MG tablet Take 1 tablet (325 mg total) by mouth daily. 100 tablet 3  . budesonide-formoterol (SYMBICORT) 160-4.5 MCG/ACT inhaler Inhale 2 puffs into the lungs 2 (two) times daily. 1 Inhaler 2  . DULoxetine (CYMBALTA) 60 MG capsule Take 1 capsule by mouth daily.    [name] eletriptan (RELPAX) 40 MG tablet Take 1 tablet (40 mg total) by mouth as needed for migraine or headache. May repeat in 2 hours if headache persists or recurs. 15 tablet 6  . linaclotide (LINZESS) 145 MCG CAPS capsule Take 1-2 capsules (145-290 mcg total) by mouth daily as needed. 60 capsule 11  . lisdexamfetamine (VYVANSE) 30 MG capsule Take 1 capsule (30 mg total) by mouth daily. 30 capsule 0  . metoprolol (LOPRESSOR) 100 MG tablet Take 1 tablet (100 mg total) by mouth 2 (two) times daily. 180 tablet 3  . nortriptyline (PAMELOR) 25 MG capsule Take 1 capsule (25 mg total) by mouth at bedtime. 30 capsule 6  . orphenadrine (NORFLEX) 100 MG tablet Limit 1 tab by mouth per day or twice per day if tolerated 60 tablet 2  . pantoprazole (PROTONIX) 40 MG tablet Take 1 tablet (40 mg total) by mouth daily. 90 tablet 2  . pregabalin (LYRICA) 50 MG capsule Take 1 capsule (50 mg total) by mouth 3  (three) times daily. 90 capsule 5  . ranitidine (ZANTAC) 300 MG tablet TAKE ONE TABLET BY MOUTH EVERY NIGHT AT BEDTIME 90 tablet 3  . Suvorexant (BELSOMRA) 15 MG TABS Take 15 mg by mouth at bedtime as needed. 30 tablet 5  . traMADol (ULTRAM) 50 MG tablet Limit 1 tablet by mouth 4 - 8  times per day if tolerated 240 tablet 0  . HYDROcodone-acetaminophen (NORCO) 7.5-325 MG tablet Take 1-2 tablets by mouth every 4 (four) hours as needed for moderate pain. 40 tablet 0   No facility-administered medications prior to visit.     ROS Review of Systems  Constitutional: Negative for activity change, appetite change, chills, fatigue and unexpected weight change.  HENT: Negative for congestion, mouth sores and sinus pressure.   Eyes: Negative for visual disturbance.  Respiratory: Negative for cough and chest tightness.   Gastrointestinal: Negative for abdominal pain and nausea.  Genitourinary: Negative for difficulty urinating, frequency and vaginal pain.  Musculoskeletal: Positive for arthralgias and gait problem. Negative for back pain.  Skin: Negative for pallor and rash.  Neurological: Negative for dizziness, tremors, weakness, numbness and headaches.  Psychiatric/Behavioral: Positive for dysphoric mood and sleep disturbance. Negative for confusion and suicidal ideas. The patient is nervous/anxious.     Objective:  BP 118/68 (BP Location: Left Arm, Patient Position: Sitting, Cuff Size: Normal)  Pulse (!) 101   Temp 97.8 F (36.6 C)   Ht 5\' 5"  (1.651 m)   Wt 211 lb (95.7 kg)   SpO2 96%   BMI 35.11 kg/m   BP Readings from Last 3 Encounters:  11/06/15 118/68  10/17/15 125/74  09/27/15 122/73    Wt Readings from Last 3 Encounters:  11/06/15 211 lb (95.7 kg)  10/17/15 204 lb (92.5 kg)  09/27/15 204 lb (92.5 kg)    Physical Exam  Constitutional: She appears well-developed. No distress.  HENT:  Head: Normocephalic.  Right Ear: External ear normal.  Left Ear: External ear normal.    Nose: Nose normal.  Mouth/Throat: Oropharynx is clear and moist.  Eyes: Conjunctivae are normal. Pupils are equal, round, and reactive to light. Right eye exhibits no discharge. Left eye exhibits no discharge.  Neck: Normal range of motion. Neck supple. No JVD present. No tracheal deviation present. No thyromegaly present.  Cardiovascular: Normal rate, regular rhythm and normal heart sounds.   Pulmonary/Chest: No stridor. No respiratory distress. She has no wheezes.  Abdominal: Soft. Bowel sounds are normal. She exhibits no distension and no mass. There is no tenderness. There is no rebound and no guarding.  Musculoskeletal: She exhibits tenderness. She exhibits no edema.  Lymphadenopathy:    She has no cervical adenopathy.  Neurological: She displays normal reflexes. No cranial nerve deficit. She exhibits normal muscle tone. Coordination abnormal.  Skin: No rash noted. No erythema.  Psychiatric: She has a normal mood and affect. Her behavior is normal. Judgment and thought content normal.  R knee is immobalize  Lab Results  Component Value Date   WBC 9.6 10/17/2015   HGB 14.4 10/17/2015   HCT 42.3 10/17/2015   PLT 211 10/17/2015   GLUCOSE 94 10/17/2015   CHOL 207 (H) 08/07/2015   TRIG 127.0 08/07/2015   HDL 38.40 (L) 08/07/2015   LDLDIRECT 161.1 02/18/2013   LDLCALC 144 (H) 08/07/2015   ALT 16 08/07/2015   AST 15 08/07/2015   NA 136 10/17/2015   K 4.3 10/17/2015   CL 105 10/17/2015   CREATININE 0.53 10/17/2015   BUN 13 10/17/2015   CO2 23 10/17/2015   TSH 1.24 08/07/2015   INR 1.05 08/29/2014   HGBA1C 5.7 05/05/2015    No results found.  Assessment & Plan:   There are no diagnoses linked to this encounter. I am having Ms. Woulfe maintain her aspirin, albuterol, metoprolol, lisdexamfetamine, pantoprazole, Suvorexant, nortriptyline, eletriptan, ranitidine, SAPHRIS, albuterol, budesonide-formoterol, DULoxetine, pregabalin, orphenadrine, linaclotide, traMADol, and  HYDROcodone-acetaminophen.  Meds ordered this encounter  Medications  . HYDROcodone-acetaminophen (NORCO/VICODIN) 5-325 MG tablet     Follow-up: No Follow-up on file.  Walker Kehr, MD

## 2015-11-06 NOTE — Assessment & Plan Note (Signed)
On Metoprolol 

## 2015-11-06 NOTE — Assessment & Plan Note (Signed)
The pt donated blood in 8/17

## 2015-11-06 NOTE — Assessment & Plan Note (Signed)
On Vit D 

## 2015-11-08 ENCOUNTER — Ambulatory Visit (HOSPITAL_COMMUNITY)
Admission: RE | Admit: 2015-11-08 | Discharge: 2015-11-08 | Disposition: A | Discharge status: Home / Self Care | Payer: Medicare Other | Source: Ambulatory Visit | Attending: Internal Medicine | Admitting: Internal Medicine

## 2015-11-08 ENCOUNTER — Other Ambulatory Visit (HOSPITAL_COMMUNITY): Payer: Self-pay | Admitting: Orthopaedic Surgery

## 2015-11-08 DIAGNOSIS — M79661 Pain in right lower leg: Secondary | ICD-10-CM | POA: Insufficient documentation

## 2015-11-08 DIAGNOSIS — M7989 Other specified soft tissue disorders: Secondary | ICD-10-CM

## 2015-11-08 DIAGNOSIS — M25561 Pain in right knee: Secondary | ICD-10-CM | POA: Diagnosis not present

## 2015-11-08 NOTE — Progress Notes (Signed)
VASCULAR LAB PRELIMINARY  PRELIMINARY  PRELIMINARY  PRELIMINARY  Right lower extremity venous duplex completed.    Preliminary report:  Right:  No evidence of DVT, superficial thrombosis, or Baker's cyst.  Daris Aristizabal, RVS 11/08/2015, 12:47 PM

## 2015-11-13 DIAGNOSIS — M25661 Stiffness of right knee, not elsewhere classified: Secondary | ICD-10-CM | POA: Diagnosis not present

## 2015-11-13 DIAGNOSIS — M25561 Pain in right knee: Secondary | ICD-10-CM | POA: Diagnosis not present

## 2015-11-17 ENCOUNTER — Other Ambulatory Visit: Payer: Self-pay | Admitting: Orthopaedic Surgery

## 2015-11-17 DIAGNOSIS — M25561 Pain in right knee: Secondary | ICD-10-CM | POA: Diagnosis not present

## 2015-11-20 NOTE — H&P (Signed)
[name] is an 45 y.o. female.   Chief Complaint: right knee pain HPI: [name] is now a month from her Fulkerson slide procedure. She is about 2 weeks from her fall in the hole and is here for follow-up. At her last visit last week we noticed some loss of fixation at her tibial tubercle osteotomy site. She is trying some therapy next door. She was having some terrible pain yesterday. She is ambulatory in a knee immobilizer without crutches.   Radiographs:  X-rays that were ordered, performed, and interpreted by me today included 2 views of the right knee.  It does look to me that she's lost some of the fixation about her tibial tubercle.  The osteotomized bone is a little more anterior than I would like and the screws are prominent.  Past Medical History:  Diagnosis Date  . Anxiety state, unspecified   . Arthritis   . Attention deficit disorder without mention of hyperactivity   . Bipolar disorder ([place])   . Constipation    takes Linzess  . Depressive disorder, not elsewhere classified   . Esophageal reflux   . Hypertension   . Migraine without aura, without mention of intractable migraine without mention of status migrainosus   . Pain in joint, site unspecified   . Pneumonia   . Polycythemia    per pt  . Unspecified asthma(493.90)   . Unspecified essential hypertension   . Unspecified vitamin D deficiency     Past Surgical History:  Procedure Laterality Date  . ABDOMINAL HYSTERECTOMY    . abdominal ultrasound  10/12/97  . APPENDECTOMY  05/27/2013   gangrenous  . INCISE AND DRAIN ABCESS  2011   Right axilla- Dr. Barkley Bruns  . KNEE ARTHROSCOPY     both knees  . KNEE ARTHROSCOPY WITH FULKERSON SLIDE Right 10/17/2015   Procedure: KNEE ARTHROSCOPY WITH Elmarie Mainland;  Surgeon: Melrose Nakayama, MD;  Location: [facility];  Service: Orthopedics;  Laterality: Right;  . NM MYOCAR PERF WALL MOTION  11/2009   bruce myoview; perfusion defect in anterior region consistent with breast  attenuation; remaining myocardium with normal perfusion; post-stress EF 74%; low risk scan   . PARTIAL HYSTERECTOMY  2007  . PLANTAR FASCIA SURGERY  2000  . TONSILLECTOMY  04/29/00  . TONSILLECTOMY Bilateral   . TRANSTHORACIC ECHOCARDIOGRAM  11/2009   EF=>55%; trace MR & TR    Family History  Problem Relation Age of Onset  . Breast cancer Mother   . Multiple sclerosis Mother   . Cancer Mother 38    breast ca  . Depression Mother   . Lupus Sister     PTSD  . Alcohol abuse Sister   . Bipolar disorder Sister   . Heart disease Father     cardiac arrest  . Alcohol abuse Brother     also HTN, lupus, enlarged heart  . Heart disease Maternal Grandfather     cardiac arrest  . Diabetes Paternal Grandfather   . Heart disease Paternal Grandfather   . Asthma      Family history  . Coronary artery disease      1st degree female < 50  . Diabetes      1st degree relative  . COPD Paternal Grandmother   . ADD / ADHD Child   . Asthma Child    Social History:  reports that she has been smoking Cigarettes.  She started smoking about 29 years ago. She has a 27.00 pack-year smoking history. She has  never used smokeless tobacco. She reports that she does not drink alcohol or use drugs.  Allergies:  Allergies  Allergen Reactions  . Lisinopril Cough  . Amoxicillin Itching    REACTION: QUESTIONABLE  . Ancef [Cefazolin] Itching  . Doxycycline Nausea And Vomiting  . Hydrochlorothiazide Other (See Comments)    CRAMPS AT HIGHER DOSAGES  . Penicillins Itching    Has patient had a PCN reaction causing immediate rash, facial/tongue/throat swelling, SOB or lightheadedness with hypotension: No Has patient had a PCN reaction causing severe rash involving mucus membranes or skin necrosis: No Has patient had a PCN reaction that required hospitalization No Has patient had a PCN reaction occurring within the last 10 years: No If all of the above answers are "NO", then may proceed with Cephalosporin use.    [name] Percocet [Oxycodone-Acetaminophen] Itching  . Talwin [Pentazocine] Nausea And Vomiting    No prescriptions prior to admission.    No results found for this or any previous visit (from the past 48 hour(s)). No results found.  Review of Systems  Musculoskeletal:       Right knee pain  All other systems reviewed and are negative.   There were no vitals taken for this visit. Physical Exam  Constitutional: She is oriented to person, place, and time. She appears well-developed and well-nourished.  HENT:  Head: Normocephalic and atraumatic.  Eyes: Pupils are equal, round, and reactive to light.  Neck: Normal range of motion.  Cardiovascular: Normal rate and regular rhythm.   Respiratory: Effort normal.  GI: Soft.  Musculoskeletal:  Right knee motion is 0-90. She really doesn't have much pain at the tibial tubercle site. On active straight leg raise I can see and feel the tip of the tibial tubercle come forward. She has trace effusion at most. Her wound is completely healed. Calf is soft and nontender. Sensation and motor function are intact in her feet with palpable pulses on both sides.   Neurological: She is alert and oriented to person, place, and time.  Skin: Skin is warm and dry.  Psychiatric: She has a normal mood and affect. Her behavior is normal. Judgment and thought content normal.     Assessment/Plan Assessment: Status post right knee Fulkerson slide 10/17/15 with loss of fixation  Plan: Unfortunately [name] has lost the fixation about her tibial tubercle. She fell into a hole a couple of weeks ago and that's probably what led to this problem. Though she doesn't express much pain I can see the tibial tubercle move on straight leg raise and I think that behooves [place] to go in and screw it back down so it can heal in proper position. I reviewed risk of anesthesia, infection, DVT related to ORIF of this piece of bone.   Kassidee Narciso, Larwance Sachs, PA-C 11/20/2015, 2:23 PM

## 2015-11-21 ENCOUNTER — Inpatient Hospital Stay (HOSPITAL_COMMUNITY): Admission: RE | Admit: 2015-11-21 | Payer: Self-pay | Source: Ambulatory Visit

## 2015-11-23 ENCOUNTER — Encounter (HOSPITAL_COMMUNITY): Payer: Self-pay | Admitting: *Deleted

## 2015-11-24 ENCOUNTER — Ambulatory Visit (HOSPITAL_COMMUNITY): Payer: Medicare Other

## 2015-11-24 ENCOUNTER — Inpatient Hospital Stay (HOSPITAL_COMMUNITY): Payer: Medicare Other | Admitting: Certified Registered Nurse Anesthetist

## 2015-11-24 ENCOUNTER — Encounter (HOSPITAL_COMMUNITY)
Admission: RE | Disposition: A | Discharge status: Home / Self Care | Payer: Self-pay | Source: Ambulatory Visit | Attending: Orthopaedic Surgery

## 2015-11-24 ENCOUNTER — Ambulatory Visit (HOSPITAL_COMMUNITY)
Admission: RE | Admit: 2015-11-24 | Discharge: 2015-11-24 | Disposition: A | Discharge status: Home / Self Care | Payer: Medicare Other | Source: Ambulatory Visit | Attending: Orthopaedic Surgery | Admitting: Orthopaedic Surgery

## 2015-11-24 ENCOUNTER — Ambulatory Visit: Payer: BLUE CROSS/BLUE SHIELD | Admitting: Nurse Practitioner

## 2015-11-24 ENCOUNTER — Encounter (HOSPITAL_COMMUNITY): Payer: Self-pay | Admitting: *Deleted

## 2015-11-24 DIAGNOSIS — Z881 Allergy status to other antibiotic agents status: Secondary | ICD-10-CM | POA: Diagnosis not present

## 2015-11-24 DIAGNOSIS — Y9289 Other specified places as the place of occurrence of the external cause: Secondary | ICD-10-CM | POA: Diagnosis not present

## 2015-11-24 DIAGNOSIS — S82209A Unspecified fracture of shaft of unspecified tibia, initial encounter for closed fracture: Secondary | ICD-10-CM

## 2015-11-24 DIAGNOSIS — Z8249 Family history of ischemic heart disease and other diseases of the circulatory system: Secondary | ICD-10-CM | POA: Diagnosis not present

## 2015-11-24 DIAGNOSIS — E559 Vitamin D deficiency, unspecified: Secondary | ICD-10-CM | POA: Insufficient documentation

## 2015-11-24 DIAGNOSIS — Y998 Other external cause status: Secondary | ICD-10-CM | POA: Insufficient documentation

## 2015-11-24 DIAGNOSIS — J45909 Unspecified asthma, uncomplicated: Secondary | ICD-10-CM | POA: Diagnosis not present

## 2015-11-24 DIAGNOSIS — S82151A Displaced fracture of right tibial tuberosity, initial encounter for closed fracture: Secondary | ICD-10-CM | POA: Insufficient documentation

## 2015-11-24 DIAGNOSIS — Z880 Allergy status to penicillin: Secondary | ICD-10-CM | POA: Insufficient documentation

## 2015-11-24 DIAGNOSIS — F17210 Nicotine dependence, cigarettes, uncomplicated: Secondary | ICD-10-CM | POA: Insufficient documentation

## 2015-11-24 DIAGNOSIS — Z803 Family history of malignant neoplasm of breast: Secondary | ICD-10-CM | POA: Diagnosis not present

## 2015-11-24 DIAGNOSIS — F419 Anxiety disorder, unspecified: Secondary | ICD-10-CM | POA: Insufficient documentation

## 2015-11-24 DIAGNOSIS — Z818 Family history of other mental and behavioral disorders: Secondary | ICD-10-CM | POA: Insufficient documentation

## 2015-11-24 DIAGNOSIS — M1990 Unspecified osteoarthritis, unspecified site: Secondary | ICD-10-CM | POA: Diagnosis not present

## 2015-11-24 DIAGNOSIS — W172XXA Fall into hole, initial encounter: Secondary | ICD-10-CM | POA: Diagnosis not present

## 2015-11-24 DIAGNOSIS — I10 Essential (primary) hypertension: Secondary | ICD-10-CM | POA: Diagnosis not present

## 2015-11-24 DIAGNOSIS — Z885 Allergy status to narcotic agent status: Secondary | ICD-10-CM | POA: Insufficient documentation

## 2015-11-24 DIAGNOSIS — Y9389 Activity, other specified: Secondary | ICD-10-CM | POA: Diagnosis not present

## 2015-11-24 DIAGNOSIS — Z833 Family history of diabetes mellitus: Secondary | ICD-10-CM | POA: Insufficient documentation

## 2015-11-24 DIAGNOSIS — F319 Bipolar disorder, unspecified: Secondary | ICD-10-CM | POA: Diagnosis not present

## 2015-11-24 DIAGNOSIS — K219 Gastro-esophageal reflux disease without esophagitis: Secondary | ICD-10-CM | POA: Diagnosis not present

## 2015-11-24 DIAGNOSIS — Z90710 Acquired absence of both cervix and uterus: Secondary | ICD-10-CM | POA: Diagnosis not present

## 2015-11-24 DIAGNOSIS — Z8489 Family history of other specified conditions: Secondary | ICD-10-CM | POA: Insufficient documentation

## 2015-11-24 DIAGNOSIS — Z811 Family history of alcohol abuse and dependence: Secondary | ICD-10-CM | POA: Diagnosis not present

## 2015-11-24 DIAGNOSIS — S82101A Unspecified fracture of upper end of right tibia, initial encounter for closed fracture: Secondary | ICD-10-CM | POA: Diagnosis not present

## 2015-11-24 DIAGNOSIS — G43909 Migraine, unspecified, not intractable, without status migrainosus: Secondary | ICD-10-CM | POA: Insufficient documentation

## 2015-11-24 DIAGNOSIS — Z888 Allergy status to other drugs, medicaments and biological substances status: Secondary | ICD-10-CM | POA: Insufficient documentation

## 2015-11-24 HISTORY — DX: Unspecified asthma, uncomplicated: J45.909

## 2015-11-24 HISTORY — PX: ORIF TIBIA FRACTURE: SHX5416

## 2015-11-24 LAB — BASIC METABOLIC PANEL
ANION GAP: 10 (ref 5–15)
BUN: 13 mg/dL (ref 6–20)
CALCIUM: 9.1 mg/dL (ref 8.9–10.3)
CO2: 24 mmol/L (ref 22–32)
CREATININE: 0.53 mg/dL (ref 0.44–1.00)
Chloride: 104 mmol/L (ref 101–111)
GLUCOSE: 98 mg/dL (ref 65–99)
Potassium: 4 mmol/L (ref 3.5–5.1)
Sodium: 138 mmol/L (ref 135–145)

## 2015-11-24 LAB — CBC
HCT: 42 % (ref 36.0–46.0)
HEMOGLOBIN: 14.7 g/dL (ref 12.0–15.0)
MCH: 30.4 pg (ref 26.0–34.0)
MCHC: 35 g/dL (ref 30.0–36.0)
MCV: 87 fL (ref 78.0–100.0)
PLATELETS: 233 10*3/uL (ref 150–400)
RBC: 4.83 MIL/uL (ref 3.87–5.11)
RDW: 12.4 % (ref 11.5–15.5)
WBC: 11.2 10*3/uL — ABNORMAL HIGH (ref 4.0–10.5)

## 2015-11-24 SURGERY — OPEN REDUCTION INTERNAL FIXATION (ORIF) TIBIA FRACTURE
Anesthesia: General | Laterality: Right

## 2015-11-24 MED ORDER — ONDANSETRON HCL 4 MG/2ML IJ SOLN: 4.0000 mg | Freq: Once | INTRAMUSCULAR | Status: DC | PRN

## 2015-11-24 MED ORDER — HYDROCODONE-ACETAMINOPHEN 5-325 MG PO TABS: 1.0000 | ORAL_TABLET | ORAL | 0 refills | Status: DC | PRN

## 2015-11-24 MED ORDER — CHLORHEXIDINE GLUCONATE 4 % EX LIQD: 60.0000 mL | Freq: Once | CUTANEOUS | Status: DC

## 2015-11-24 MED ORDER — BUPIVACAINE-EPINEPHRINE (PF) 0.5% -1:200000 IJ SOLN
INTRAMUSCULAR | Status: AC
  Filled 2015-11-24: qty 30

## 2015-11-24 MED ORDER — KETOROLAC TROMETHAMINE 30 MG/ML IJ SOLN
INTRAMUSCULAR | Status: AC
  Filled 2015-11-24: qty 1

## 2015-11-24 MED ORDER — FENTANYL CITRATE (PF) 100 MCG/2ML IJ SOLN
INTRAMUSCULAR | Status: AC
  Filled 2015-11-24: qty 2

## 2015-11-24 MED ORDER — HYDROCODONE-ACETAMINOPHEN 5-325 MG PO TABS
ORAL_TABLET | ORAL | Status: AC
  Filled 2015-11-24: qty 2

## 2015-11-24 MED ORDER — FENTANYL CITRATE (PF) 100 MCG/2ML IJ SOLN
INTRAMUSCULAR | Status: AC
  Administered 2015-11-24: 25 ug via INTRAVENOUS
  Filled 2015-11-24: qty 2

## 2015-11-24 MED ORDER — ONDANSETRON HCL 4 MG/2ML IJ SOLN
INTRAMUSCULAR | Status: AC
  Filled 2015-11-24: qty 2

## 2015-11-24 MED ORDER — FENTANYL CITRATE (PF) 100 MCG/2ML IJ SOLN
INTRAMUSCULAR | Status: DC
Start: 2015-11-24 — End: 2015-11-24
  Filled 2015-11-24: qty 2

## 2015-11-24 MED ORDER — LIDOCAINE 2% (20 MG/ML) 5 ML SYRINGE
INTRAMUSCULAR | Status: AC
  Filled 2015-11-24: qty 5

## 2015-11-24 MED ORDER — HYDROCODONE-ACETAMINOPHEN 5-325 MG PO TABS
1.0000 | ORAL_TABLET | Freq: Once | ORAL | Status: AC
  Administered 2015-11-24: 2 via ORAL

## 2015-11-24 MED ORDER — MIDAZOLAM HCL 5 MG/5ML IJ SOLN
INTRAMUSCULAR | Status: DC | PRN
  Administered 2015-11-24: 2 mg via INTRAVENOUS

## 2015-11-24 MED ORDER — FENTANYL CITRATE (PF) 100 MCG/2ML IJ SOLN
25.0000 ug | INTRAMUSCULAR | Status: DC | PRN
  Administered 2015-11-24 (×3): 25 ug via INTRAVENOUS
  Administered 2015-11-24: 50 ug via INTRAVENOUS

## 2015-11-24 MED ORDER — FENTANYL CITRATE (PF) 100 MCG/2ML IJ SOLN
INTRAMUSCULAR | Status: DC | PRN
  Administered 2015-11-24 (×2): 50 ug via INTRAVENOUS

## 2015-11-24 MED ORDER — BUPIVACAINE-EPINEPHRINE (PF) 0.5% -1:200000 IJ SOLN
INTRAMUSCULAR | Status: DC | PRN
  Administered 2015-11-24: 10 mL

## 2015-11-24 MED ORDER — VANCOMYCIN HCL IN DEXTROSE 1-5 GM/200ML-% IV SOLN
1000.0000 mg | INTRAVENOUS | Status: AC
  Administered 2015-11-24: 1000 mg via INTRAVENOUS
  Filled 2015-11-24: qty 200

## 2015-11-24 MED ORDER — LIDOCAINE 2% (20 MG/ML) 5 ML SYRINGE
INTRAMUSCULAR | Status: DC | PRN
  Administered 2015-11-24: 40 mg via INTRAVENOUS

## 2015-11-24 MED ORDER — ONDANSETRON HCL 4 MG/2ML IJ SOLN
INTRAMUSCULAR | Status: DC | PRN
  Administered 2015-11-24: 4 mg via INTRAVENOUS

## 2015-11-24 MED ORDER — FENTANYL CITRATE (PF) 100 MCG/2ML IJ SOLN: 25.0000 ug | INTRAMUSCULAR | Status: DC | PRN

## 2015-11-24 MED ORDER — LACTATED RINGERS IV SOLN
INTRAVENOUS | Status: DC
  Administered 2015-11-24: 11:00:00 via INTRAVENOUS

## 2015-11-24 MED ORDER — MIDAZOLAM HCL 2 MG/2ML IJ SOLN
INTRAMUSCULAR | Status: AC
  Filled 2015-11-24: qty 2

## 2015-11-24 MED ORDER — PROPOFOL 10 MG/ML IV BOLUS
INTRAVENOUS | Status: DC | PRN
  Administered 2015-11-24: 170 mg via INTRAVENOUS

## 2015-11-24 MED ORDER — LACTATED RINGERS IV SOLN
INTRAVENOUS | Status: DC | PRN
  Administered 2015-11-24 (×2): via INTRAVENOUS

## 2015-11-24 MED ORDER — 0.9 % SODIUM CHLORIDE (POUR BTL) OPTIME
TOPICAL | Status: DC | PRN
  Administered 2015-11-24: 1000 mL

## 2015-11-24 MED ORDER — KETOROLAC TROMETHAMINE 30 MG/ML IJ SOLN
30.0000 mg | Freq: Once | INTRAMUSCULAR | Status: AC
  Administered 2015-11-24: 30 mg via INTRAVENOUS

## 2015-11-24 SURGICAL SUPPLY — 54 items
BANDAGE ELASTIC 6 VELCRO ST LF (GAUZE/BANDAGES/DRESSINGS) ×1 IMPLANT
BLADE SURG 10 STRL SS (BLADE) ×2 IMPLANT
BLADE SURG 15 STRL LF DISP TIS (BLADE) ×1 IMPLANT
BLADE SURG 15 STRL SS (BLADE) ×2
BNDG CMPR 9X4 STRL LF SNTH (GAUZE/BANDAGES/DRESSINGS) ×1
BNDG COHESIVE 6X5 TAN STRL LF (GAUZE/BANDAGES/DRESSINGS) ×1 IMPLANT
BNDG ESMARK 4X9 LF (GAUZE/BANDAGES/DRESSINGS) ×2 IMPLANT
BNDG GAUZE ELAST 4 BULKY (GAUZE/BANDAGES/DRESSINGS) ×1 IMPLANT
CLEANER TIP ELECTROSURG 2X2 (MISCELLANEOUS) ×2 IMPLANT
CLSR STERI-STRIP ANTIMIC 1/2X4 (GAUZE/BANDAGES/DRESSINGS) ×1 IMPLANT
COVER MAYO STAND STRL (DRAPES) ×2 IMPLANT
CUFF TOURNIQUET SINGLE 34IN LL (TOURNIQUET CUFF) ×1 IMPLANT
DRAPE C-ARM 42X72 X-RAY (DRAPES) ×2 IMPLANT
DRAPE U-SHAPE 47X51 STRL (DRAPES) ×2 IMPLANT
DRSG ADAPTIC 3X8 NADH LF (GAUZE/BANDAGES/DRESSINGS) ×1 IMPLANT
DRSG PAD ABDOMINAL 8X10 ST (GAUZE/BANDAGES/DRESSINGS) ×1 IMPLANT
ELECT REM PT RETURN 9FT ADLT (ELECTROSURGICAL) ×2
ELECTRODE REM PT RTRN 9FT ADLT (ELECTROSURGICAL) ×1 IMPLANT
EVACUATOR 1/8 PVC DRAIN (DRAIN) IMPLANT
EVACUATOR 3/16  PVC DRAIN (DRAIN)
EVACUATOR 3/16 PVC DRAIN (DRAIN) IMPLANT
GAUZE SPONGE 4X4 12PLY STRL (GAUZE/BANDAGES/DRESSINGS) ×1 IMPLANT
GLOVE BIO SURGEON STRL SZ8 (GLOVE) ×8 IMPLANT
GLOVE BIOGEL PI IND STRL 8 (GLOVE) ×1 IMPLANT
GLOVE BIOGEL PI INDICATOR 8 (GLOVE) ×1
GOWN STRL REUS W/ TWL LRG LVL3 (GOWN DISPOSABLE) ×4 IMPLANT
GOWN STRL REUS W/TWL 2XL LVL3 (GOWN DISPOSABLE) ×2 IMPLANT
GOWN STRL REUS W/TWL LRG LVL3 (GOWN DISPOSABLE) ×4
IMMOBILIZER KNEE 22 UNIV (SOFTGOODS) ×1 IMPLANT
KIT BASIN OR (CUSTOM PROCEDURE TRAY) ×2 IMPLANT
KIT ROOM TURNOVER OR (KITS) ×2 IMPLANT
MANIFOLD NEPTUNE II (INSTRUMENTS) ×2 IMPLANT
NDL HYPO 25GX1X1/2 BEV (NEEDLE) IMPLANT
NEEDLE HYPO 25GX1X1/2 BEV (NEEDLE) ×2 IMPLANT
NS IRRIG 1000ML POUR BTL (IV SOLUTION) ×2 IMPLANT
PACK ORTHO EXTREMITY (CUSTOM PROCEDURE TRAY) ×2 IMPLANT
PAD ARMBOARD 7.5X6 YLW CONV (MISCELLANEOUS) ×3 IMPLANT
SCREW CANC FT 4.0X55 (Screw) ×1 IMPLANT
SPONGE LAP 18X18 X RAY DECT (DISPOSABLE) ×2 IMPLANT
STOCKINETTE IMPERVIOUS LG (DRAPES) ×2 IMPLANT
SUCTION FRAZIER HANDLE 10FR (MISCELLANEOUS) ×1
SUCTION TUBE FRAZIER 10FR DISP (MISCELLANEOUS) ×1 IMPLANT
SUT ETHIBOND 2 0 V5 (SUTURE) ×2 IMPLANT
SUT MNCRL AB 4-0 PS2 18 (SUTURE) ×1 IMPLANT
SUT VIC AB 0 CT1 27 (SUTURE) ×2
SUT VIC AB 0 CT1 27XBRD ANBCTR (SUTURE) ×1 IMPLANT
SUT VIC AB 1 CT1 27 (SUTURE) ×2
SUT VIC AB 1 CT1 27XBRD ANBCTR (SUTURE) ×2 IMPLANT
SUT VIC AB 2-0 CT1 27 (SUTURE) ×2
SUT VIC AB 2-0 CT1 TAPERPNT 27 (SUTURE) ×1 IMPLANT
SYR CONTROL 10ML LL (SYRINGE) ×1 IMPLANT
TOWEL OR 17X24 6PK STRL BLUE (TOWEL DISPOSABLE) ×2 IMPLANT
TUBE CONNECTING 12X1/4 (SUCTIONS) ×2 IMPLANT
YANKAUER SUCT BULB TIP NO VENT (SUCTIONS) ×2 IMPLANT

## 2015-11-24 NOTE — Transfer of Care (Signed)
Immediate Anesthesia Transfer of Care Note  Patient: [name]  Procedure(s) Performed: Procedure(s): OPEN REDUCTION INTERNAL FIXATION (ORIF) TIBIA FRACTURE (Right)  Patient Location: PACU  Anesthesia Type:General  Level of Consciousness: awake, alert , oriented and patient cooperative  Airway & Oxygen Therapy: Patient Spontanous Breathing and Patient connected to nasal cannula oxygen  Post-op Assessment: Report given to RN and Post -op Vital signs reviewed and stable  Post vital signs: Reviewed and stable  Last Vitals:  Vitals:   11/24/15 0951 11/24/15 1440  BP: 126/71   Pulse: 84   Resp: 18   Temp: 36.4 C (P) 36.6 C    Last Pain:  Vitals:   11/24/15 0951  TempSrc: Oral         Complications: No apparent anesthesia complications

## 2015-11-24 NOTE — Op Note (Signed)
#  [id number] 

## 2015-11-24 NOTE — Anesthesia Preprocedure Evaluation (Signed)
Anesthesia Evaluation  Patient identified by MRN, date of birth, ID band Patient awake    Reviewed: Allergy & Precautions, NPO status , Patient's Chart, lab work & pertinent test results  Airway Mallampati: II  TM Distance: >3 FB Neck ROM: Full    Dental  (+) Teeth Intact, Dental Advisory Given   Pulmonary Current Smoker,    breath sounds clear to auscultation       Cardiovascular hypertension,  Rhythm:Regular Rate:Normal     Neuro/Psych    GI/Hepatic   Endo/Other    Renal/GU      Musculoskeletal   Abdominal   Peds  Hematology   Anesthesia Other Findings   Reproductive/Obstetrics                             Anesthesia Physical Anesthesia Plan  ASA: II  Anesthesia Plan: General   Post-op Pain Management:    Induction: Intravenous  Airway Management Planned: LMA  Additional Equipment:   Intra-op Plan:   Post-operative Plan:   Informed Consent: I have reviewed the patients History and Physical, chart, labs and discussed the procedure including the risks, benefits and alternatives for the proposed anesthesia with the patient or authorized representative who has indicated his/her understanding and acceptance.   Dental advisory given  Plan Discussed with: CRNA and Anesthesiologist  Anesthesia Plan Comments:         Anesthesia Quick Evaluation

## 2015-11-24 NOTE — Anesthesia Procedure Notes (Signed)
Procedure Name: LMA Insertion Date/Time: 11/24/2015 1:27 PM Performed by: Everlean Cherry A Pre-anesthesia Checklist: Patient identified, Emergency Drugs available, Suction available and Patient being monitored Patient Re-evaluated:Patient Re-evaluated prior to inductionOxygen Delivery Method: Circle system utilized Preoxygenation: Pre-oxygenation with 100% oxygen Intubation Type: IV induction Ventilation: Mask ventilation without difficulty LMA: LMA inserted LMA Size: 4.0 Number of attempts: 1 Placement Confirmation: positive ETCO2 and breath sounds checked- equal and bilateral Tube secured with: Tape Dental Injury: Teeth and Oropharynx as per pre-operative assessment

## 2015-11-24 NOTE — Anesthesia Postprocedure Evaluation (Signed)
Anesthesia Post Note  Patient: [name]  Procedure(s) Performed: Procedure(s) (LRB): OPEN REDUCTION INTERNAL FIXATION (ORIF) TIBIA FRACTURE (Right)  Patient location during evaluation: PACU Anesthesia Type: General Level of consciousness: awake, awake and alert and oriented Pain management: pain level controlled Vital Signs Assessment: post-procedure vital signs reviewed and stable Respiratory status: spontaneous breathing, nonlabored ventilation and respiratory function stable Cardiovascular status: blood pressure returned to baseline Anesthetic complications: no    Last Vitals:  Vitals:   11/24/15 1501 11/24/15 1542  BP: 134/82   Pulse: 85 78  Resp: 11   Temp:      Last Pain:  Vitals:   11/24/15 1542  TempSrc:   PainSc: 6                  Jan Olano COKER

## 2015-11-24 NOTE — Interval H&P Note (Signed)
History and Physical Interval Note:  11/24/2015 1:04 PM  [name]  has presented today for surgery, with the diagnosis of RIGHT TIBIAL TUBERCLE FRACTURE  The various methods of treatment have been discussed with the patient and family. After consideration of risks, benefits and other options for treatment, the patient has consented to  Procedure(s): OPEN REDUCTION INTERNAL FIXATION (ORIF) TIBIA FRACTURE (Right) as a surgical intervention .  The patient's history has been reviewed, patient examined, no change in status, stable for surgery.  I have reviewed the patient's chart and labs.  Questions were answered to the patient's satisfaction.     Semira Stoltzfus G

## 2015-11-25 NOTE — Op Note (Signed)
[name], [name] NO.:  [id number]  MEDICAL RECORD NO.:  [id number]  LOCATION:  [facility]                         FACILITY:  [facility]  PHYSICIAN:  Monico Blitz. Kely Dohn, M.D.DATE OF BIRTH:  1970/08/13  DATE OF PROCEDURE:  11/24/2015 DATE OF DISCHARGE:  11/24/2015                              OPERATIVE REPORT   PREOPERATIVE DIAGNOSIS:  Right knee tibial tubercle fracture.  POSTOPERATIVE DIAGNOSIS:  Right knee tibial tubercle fracture.  PROCEDURE:  Right knee tibial tubercle ORIF.  ANESTHESIA:  General.  ATTENDING SURGEON:  Monico Blitz. Graylee Arutyunyan, M.D.  ASSISTANT:  Loni Dolly, PA.  INDICATION FOR PROCEDURE:  The patient is a 45 year old woman, who is about a month out from her right knee operation, where we performed a Fulkerson slide procedure moving her tibial tubercle in an anteromedial direction and stabilizing it with 2 screws.  She was doing well postoperatively, but unfortunately fell in a hole and suffered loss of fixation in this area.  We observed it for a couple of weeks, but things got worse.  At this point, she is offered operative intervention to consist of ORIF of the tibial tubercle.  Informed operative consent was obtained after discussion of possible complications including reaction to anesthesia, infection, and malunion.  SUMMARY OF FINDINGS AND PROCEDURE:  Under general anesthesia through her old incision, the tibial tubercle was exposed.  It was reduced into appropriate translated position and then stabilized with 2 screws.  This was done under fluoroscopic guidance, and I read these views myself. She was closed primarily and scheduled to go home same day.  DESCRIPTION OF PROCEDURE:  The patient was taken to the operating suite where general anesthetic was applied without difficulty.  She was positioned supine and prepped and draped in normal sterile fashion. After the administration of preop IV vancomycin and appropriate time- out, the right  leg was elevated, exsanguinated, and a tourniquet inflated about the thigh.  We use her old anterior incision, was dissected down to the tibial tubercle which was loose.  I removed the 2 screws without difficulty.  We then reduced the tibial tubercle in an appropriate position after removal of some interposed devitalized tissue.  We kept the tibial tubercle in an anteromedial position as was our attention with the initial surgery.  I then placed 2 screws in her old tibial tubercle drill holes through new drill holes in her tibia. One of these screws was one of her old 50 mm partially threaded cancellous screws while another one was a 55 mm fully threaded cortical screw.  This seemed to give [place] a good reduction of the fracture with excellent bone bite with each screw.  I used fluoroscopy to confirm adequate placement of hardware which did engage the posterior cortex purposefully.  We then irrigated.  The tourniquet was deflated and a small amount of bleeding was easily controlled with some cautery.  We reapproximated deep tissues with Vicryl and subcutaneous tissues with a running subcuticular stitch.  We applied Steri-Strips, we injected the wound with Marcaine with epinephrine.  Adaptic was applied followed by dry gauze and loose Ace wrap with a knee immobilizer.  Estimated blood loss and fluids, obtain from anesthesia records  as can accurate tourniquet time.  DISPOSITION:  The patient was extubated in operating room and taken to recovery in stable condition.  She was to go home same-day and follow up in the office in less than a week.  I will contact her by phone tonight.     Monico Blitz [name], M.D.     PGD/MEDQ  D:  11/24/2015  T:  11/25/2015  Job:  [id number]

## 2015-11-27 ENCOUNTER — Encounter (HOSPITAL_COMMUNITY): Payer: Self-pay | Admitting: Orthopaedic Surgery

## 2015-11-27 ENCOUNTER — Ambulatory Visit: Payer: Self-pay | Admitting: Pain Medicine

## 2015-11-29 ENCOUNTER — Other Ambulatory Visit: Payer: Self-pay | Admitting: Pain Medicine

## 2015-12-01 DIAGNOSIS — M25561 Pain in right knee: Secondary | ICD-10-CM | POA: Diagnosis not present

## 2015-12-05 ENCOUNTER — Other Ambulatory Visit: Payer: Self-pay | Admitting: Internal Medicine

## 2015-12-06 ENCOUNTER — Other Ambulatory Visit: Payer: Self-pay | Admitting: Internal Medicine

## 2015-12-12 ENCOUNTER — Other Ambulatory Visit: Payer: Self-pay | Admitting: Internal Medicine

## 2015-12-12 NOTE — Telephone Encounter (Signed)
Ok to R meloxicam? Not on her active med list.

## 2015-12-20 DIAGNOSIS — M25562 Pain in left knee: Secondary | ICD-10-CM | POA: Diagnosis not present

## 2015-12-20 DIAGNOSIS — M25561 Pain in right knee: Secondary | ICD-10-CM | POA: Diagnosis not present

## 2015-12-25 DIAGNOSIS — M47817 Spondylosis without myelopathy or radiculopathy, lumbosacral region: Secondary | ICD-10-CM | POA: Diagnosis not present

## 2015-12-25 DIAGNOSIS — M791 Myalgia: Secondary | ICD-10-CM | POA: Diagnosis not present

## 2015-12-25 DIAGNOSIS — M5416 Radiculopathy, lumbar region: Secondary | ICD-10-CM | POA: Diagnosis not present

## 2015-12-25 DIAGNOSIS — R0782 Intercostal pain: Secondary | ICD-10-CM | POA: Diagnosis not present

## 2015-12-27 ENCOUNTER — Other Ambulatory Visit: Payer: Self-pay | Admitting: Pain Medicine

## 2015-12-28 ENCOUNTER — Other Ambulatory Visit: Payer: Self-pay | Admitting: Internal Medicine

## 2015-12-31 ENCOUNTER — Other Ambulatory Visit: Payer: Self-pay | Admitting: Internal Medicine

## 2016-01-05 DIAGNOSIS — M25661 Stiffness of right knee, not elsewhere classified: Secondary | ICD-10-CM | POA: Diagnosis not present

## 2016-01-05 DIAGNOSIS — M25561 Pain in right knee: Secondary | ICD-10-CM | POA: Diagnosis not present

## 2016-01-10 DIAGNOSIS — M25561 Pain in right knee: Secondary | ICD-10-CM | POA: Diagnosis not present

## 2016-01-10 DIAGNOSIS — M25661 Stiffness of right knee, not elsewhere classified: Secondary | ICD-10-CM | POA: Diagnosis not present

## 2016-01-18 ENCOUNTER — Other Ambulatory Visit: Payer: Self-pay | Admitting: Internal Medicine

## 2016-01-18 ENCOUNTER — Telehealth: Payer: Self-pay | Admitting: Internal Medicine

## 2016-01-18 DIAGNOSIS — I10 Essential (primary) hypertension: Secondary | ICD-10-CM

## 2016-01-18 DIAGNOSIS — F411 Generalized anxiety disorder: Secondary | ICD-10-CM

## 2016-01-18 NOTE — Telephone Encounter (Signed)
Patient would like to know if it is time for her to have labs done.

## 2016-01-19 NOTE — Telephone Encounter (Signed)
Pls sch ROV w/CMET, CBC prior Thx

## 2016-01-23 NOTE — Telephone Encounter (Signed)
Notified pt w/MD response. Made appt 02/01/16 labs has been entered...[name]

## 2016-01-24 ENCOUNTER — Encounter (INDEPENDENT_AMBULATORY_CARE_PROVIDER_SITE_OTHER): Payer: Medicare Other

## 2016-01-24 DIAGNOSIS — R0782 Intercostal pain: Secondary | ICD-10-CM | POA: Diagnosis not present

## 2016-01-24 DIAGNOSIS — I10 Essential (primary) hypertension: Secondary | ICD-10-CM

## 2016-01-24 DIAGNOSIS — M47817 Spondylosis without myelopathy or radiculopathy, lumbosacral region: Secondary | ICD-10-CM | POA: Diagnosis not present

## 2016-01-24 DIAGNOSIS — M791 Myalgia: Secondary | ICD-10-CM | POA: Diagnosis not present

## 2016-01-24 DIAGNOSIS — F411 Generalized anxiety disorder: Secondary | ICD-10-CM

## 2016-01-24 DIAGNOSIS — M5416 Radiculopathy, lumbar region: Secondary | ICD-10-CM | POA: Diagnosis not present

## 2016-01-24 LAB — COMPREHENSIVE METABOLIC PANEL
ALT: 16 U/L (ref 0–35)
AST: 15 U/L (ref 0–37)
Albumin: 4 g/dL (ref 3.5–5.2)
Alkaline Phosphatase: 109 U/L (ref 39–117)
BILIRUBIN TOTAL: 0.3 mg/dL (ref 0.2–1.2)
BUN: 12 mg/dL (ref 6–23)
CHLORIDE: 105 meq/L (ref 96–112)
CO2: 26 meq/L (ref 19–32)
CREATININE: 0.68 mg/dL (ref 0.40–1.20)
Calcium: 8.9 mg/dL (ref 8.4–10.5)
GFR: 100.29 mL/min (ref >60.00)
GLUCOSE: 110 mg/dL — AB (ref 70–99)
Potassium: 4 mEq/L (ref 3.5–5.1)
Sodium: 140 mEq/L (ref 135–145)
Total Protein: 6.6 g/dL (ref 6.0–8.3)

## 2016-01-24 LAB — CBC WITH DIFFERENTIAL/PLATELET
BASOS ABS: 0.1 10*3/uL (ref 0.0–0.1)
BASOS PCT: 0.9 % (ref 0.0–3.0)
EOS ABS: 0.2 10*3/uL (ref 0.0–0.7)
Eosinophils Relative: 3.2 % (ref 0.0–5.0)
HCT: 41.3 % (ref 36.0–46.0)
Hemoglobin: 14.1 g/dL (ref 12.0–15.0)
LYMPHS ABS: 2.9 10*3/uL (ref 0.7–4.0)
Lymphocytes Relative: 38.1 % (ref 12.0–46.0)
MCHC: 34.1 g/dL (ref 30.0–36.0)
MCV: 88.3 fl (ref 78.0–100.0)
MONO ABS: 0.9 10*3/uL (ref 0.1–1.0)
Monocytes Relative: 11.5 % (ref 3.0–12.0)
NEUTROS ABS: 3.5 10*3/uL (ref 1.4–7.7)
NEUTROS PCT: 46.3 % (ref 43.0–77.0)
PLATELETS: 213 10*3/uL (ref 150.0–400.0)
RBC: 4.68 Mil/uL (ref 3.87–5.11)
RDW: 14.7 % (ref 11.5–15.5)
WBC: 7.6 10*3/uL (ref 4.0–10.5)

## 2016-02-01 ENCOUNTER — Ambulatory Visit: Payer: Self-pay | Admitting: Internal Medicine

## 2016-02-07 DIAGNOSIS — M25561 Pain in right knee: Secondary | ICD-10-CM | POA: Diagnosis not present

## 2016-02-15 ENCOUNTER — Ambulatory Visit: Payer: Self-pay | Admitting: Internal Medicine

## 2016-02-20 ENCOUNTER — Encounter: Payer: Self-pay | Admitting: Internal Medicine

## 2016-02-20 ENCOUNTER — Ambulatory Visit (INDEPENDENT_AMBULATORY_CARE_PROVIDER_SITE_OTHER): Payer: Medicare Other | Admitting: Internal Medicine

## 2016-02-20 DIAGNOSIS — F319 Bipolar disorder, unspecified: Secondary | ICD-10-CM

## 2016-02-20 DIAGNOSIS — E559 Vitamin D deficiency, unspecified: Secondary | ICD-10-CM

## 2016-02-20 DIAGNOSIS — I10 Essential (primary) hypertension: Secondary | ICD-10-CM | POA: Diagnosis not present

## 2016-02-20 DIAGNOSIS — J4520 Mild intermittent asthma, uncomplicated: Secondary | ICD-10-CM | POA: Diagnosis not present

## 2016-02-20 DIAGNOSIS — M47817 Spondylosis without myelopathy or radiculopathy, lumbosacral region: Secondary | ICD-10-CM | POA: Diagnosis not present

## 2016-02-20 DIAGNOSIS — M5416 Radiculopathy, lumbar region: Secondary | ICD-10-CM | POA: Diagnosis not present

## 2016-02-20 DIAGNOSIS — M791 Myalgia: Secondary | ICD-10-CM | POA: Diagnosis not present

## 2016-02-20 DIAGNOSIS — K219 Gastro-esophageal reflux disease without esophagitis: Secondary | ICD-10-CM | POA: Diagnosis not present

## 2016-02-20 DIAGNOSIS — G43011 Migraine without aura, intractable, with status migrainosus: Secondary | ICD-10-CM

## 2016-02-20 DIAGNOSIS — D751 Secondary polycythemia: Secondary | ICD-10-CM

## 2016-02-20 DIAGNOSIS — R0782 Intercostal pain: Secondary | ICD-10-CM | POA: Diagnosis not present

## 2016-02-20 MED ORDER — RANITIDINE HCL 300 MG PO TABS: 300.0000 mg | ORAL_TABLET | Freq: Every day | ORAL | 3 refills | Status: DC

## 2016-02-20 MED ORDER — SUVOREXANT 15 MG PO TABS: 15.0000 mg | ORAL_TABLET | Freq: Every evening | ORAL | 5 refills | Status: DC | PRN

## 2016-02-20 MED ORDER — GABAPENTIN 100 MG PO CAPS: 100.0000 mg | ORAL_CAPSULE | Freq: Three times a day (TID) | ORAL | 1 refills | Status: DC

## 2016-02-20 MED ORDER — MELOXICAM 15 MG PO TABS: 15.0000 mg | ORAL_TABLET | Freq: Every day | ORAL | 1 refills | Status: DC

## 2016-02-20 MED ORDER — NORTRIPTYLINE HCL 25 MG PO CAPS: 25.0000 mg | ORAL_CAPSULE | Freq: Every day | ORAL | 6 refills | Status: DC

## 2016-02-20 MED ORDER — PANTOPRAZOLE SODIUM 40 MG PO TBEC: 40.0000 mg | DELAYED_RELEASE_TABLET | Freq: Every day | ORAL | 3 refills | Status: DC

## 2016-02-20 MED ORDER — METOPROLOL TARTRATE 100 MG PO TABS: 100.0000 mg | ORAL_TABLET | Freq: Two times a day (BID) | ORAL | 3 refills | Status: DC

## 2016-02-20 NOTE — Assessment & Plan Note (Signed)
Metoprolol 

## 2016-02-20 NOTE — Assessment & Plan Note (Signed)
On Saphris, Cymbalta, Gabapentin, Vyvance 

## 2016-02-20 NOTE — Assessment & Plan Note (Signed)
On Vi D

## 2016-02-20 NOTE — Progress Notes (Addendum)
Subjective:  Patient ID: [name], female    DOB: [birth year]  Age: 46 y.o. MRN: [id number]  CC: No chief complaint on file.   HPI [name] presents for asthma, bipolar disorder, HAs f/u. C/o med cost - needs new Rx's  Outpatient Medications Prior to Visit  Medication Sig Dispense Refill  . albuterol (PROVENTIL) (2.5 MG/3ML) 0.083% nebulizer solution Take 3 mLs (2.5 mg total) by nebulization every 6 (six) hours as needed. For shortness of breath. 75 mL 0  . Asenapine Maleate (SAPHRIS) 10 MG SUBL Place under the tongue at bedtime.    [name] aspirin (BAYER ASPIRIN) 325 MG tablet Take 1 tablet (325 mg total) by mouth daily. 100 tablet 3  . budesonide-formoterol (SYMBICORT) 160-4.5 MCG/ACT inhaler INHALE 2 PUFFS BY MOUTH TWICE A DAY 10.2 Inhaler 5  . DULoxetine (CYMBALTA) 60 MG capsule Take 60 mg by mouth daily.     [name] eletriptan (RELPAX) 40 MG tablet Take 1 tablet (40 mg total) by mouth as needed for migraine or headache. May repeat in 2 hours if headache persists or recurs. 15 tablet 6  . fluconazole (DIFLUCAN) 150 MG tablet TAKE 1 TABLET BY MOUTH EVERY 3 DAYS AS NEEDED 2 tablet 0  . HYDROcodone-acetaminophen (NORCO/VICODIN) 5-325 MG tablet Take 1-2 tablets by mouth every 4 (four) hours as needed for moderate pain. 30 tablet 0  . linaclotide (LINZESS) 145 MCG CAPS capsule Take 1-2 capsules (145-290 mcg total) by mouth daily as needed. (Patient taking differently: Take 145 mcg by mouth 2 (two) times daily. ) 60 capsule 11  . lisdexamfetamine (VYVANSE) 20 MG capsule Take 20 mg by mouth daily.    . meloxicam (MOBIC) 15 MG tablet TAKE 1 TABLET BY MOUTH EVERY DAY 30 tablet 1  . metoprolol (LOPRESSOR) 100 MG tablet TAKE 1 TABLET BY MOUTH TWICE A DAY 180 tablet 3  . nortriptyline (PAMELOR) 25 MG capsule Take 1 capsule (25 mg total) by mouth at bedtime. 30 capsule 6  . orphenadrine (NORFLEX) 100 MG tablet Limit 1 tab by mouth per day or twice per day if tolerated 60 tablet 2  . pantoprazole  (PROTONIX) 40 MG tablet TAKE 1 TABLET BY MOUTH EVERY DAY 90 tablet 3  . pregabalin (LYRICA) 50 MG capsule Take 1 capsule (50 mg total) by mouth 3 (three) times daily. 90 capsule 5  . PROAIR HFA 108 (90 Base) MCG/ACT inhaler INHALE 2 PUFFS BY MOUTH EVERY 4 HOURS AS NEEDED FOR SHORTNESS OF BREATH 8.5 Inhaler 2  . ranitidine (ZANTAC) 300 MG tablet TAKE ONE TABLET BY MOUTH EVERY NIGHT AT BEDTIME 90 tablet 3  . Suvorexant (BELSOMRA) 15 MG TABS Take 15 mg by mouth at bedtime as needed. 30 tablet 5  . traMADol (ULTRAM) 50 MG tablet Limit 1 tablet by mouth 4 - 8  times per day if tolerated 240 tablet 0   No facility-administered medications prior to visit.     ROS Review of Systems  Constitutional: Negative for activity change, appetite change, chills, fatigue and unexpected weight change.  HENT: Negative for congestion, mouth sores and sinus pressure.   Eyes: Negative for visual disturbance.  Respiratory: Negative for cough and chest tightness.   Gastrointestinal: Negative for abdominal pain and nausea.  Genitourinary: Negative for difficulty urinating, frequency and vaginal pain.  Musculoskeletal: Negative for back pain and gait problem.  Skin: Negative for pallor and rash.  Neurological: Negative for dizziness, tremors, weakness, numbness and headaches.  Psychiatric/Behavioral: Negative for confusion, sleep disturbance and  suicidal ideas. The patient is nervous/anxious.     Objective:  BP 134/76   Pulse 63   Temp 97.9 F (36.6 C) (Oral)   Resp 20   Wt 203 lb 8 oz (92.3 kg)   SpO2 98%   BMI 33.86 kg/m   BP Readings from Last 3 Encounters:  02/20/16 134/76  11/24/15 134/82  11/06/15 118/68    Wt Readings from Last 3 Encounters:  02/20/16 203 lb 8 oz (92.3 kg)  11/24/15 205 lb (93 kg)  11/06/15 211 lb (95.7 kg)    Physical Exam  Constitutional: She appears well-developed. No distress.  HENT:  Head: Normocephalic.  Right Ear: External ear normal.  Left Ear: External ear  normal.  Nose: Nose normal.  Mouth/Throat: Oropharynx is clear and moist.  Eyes: Conjunctivae are normal. Pupils are equal, round, and reactive to light. Right eye exhibits no discharge. Left eye exhibits no discharge.  Neck: Normal range of motion. Neck supple. No JVD present. No tracheal deviation present. No thyromegaly present.  Cardiovascular: Normal rate, regular rhythm and normal heart sounds.   Pulmonary/Chest: No stridor. No respiratory distress. She has no wheezes.  Abdominal: Soft. Bowel sounds are normal. She exhibits no distension and no mass. There is no tenderness. There is no rebound and no guarding.  Musculoskeletal: She exhibits no edema or tenderness.  Lymphadenopathy:    She has no cervical adenopathy.  Neurological: She displays normal reflexes. No cranial nerve deficit. She exhibits normal muscle tone. Coordination normal.  Skin: No rash noted. No erythema.  Psychiatric: Her behavior is normal. Judgment and thought content normal.    Lab Results  Component Value Date   WBC 7.6 01/24/2016   HGB 14.1 01/24/2016   HCT 41.3 01/24/2016   PLT 213.0 01/24/2016   GLUCOSE 110 (H) 01/24/2016   CHOL 207 (H) 08/07/2015   TRIG 127.0 08/07/2015   HDL 38.40 (L) 08/07/2015   LDLDIRECT 161.1 02/18/2013   LDLCALC 144 (H) 08/07/2015   ALT 16 01/24/2016   AST 15 01/24/2016   NA 140 01/24/2016   K 4.0 01/24/2016   CL 105 01/24/2016   CREATININE 0.68 01/24/2016   BUN 12 01/24/2016   CO2 26 01/24/2016   TSH 1.24 08/07/2015   INR 1.05 08/29/2014   HGBA1C 5.7 05/05/2015    Dg Knee 1-2 Views Right  Result Date: 11/24/2015 CLINICAL DATA:  ORIF tibia fracture EXAM: RIGHT KNEE - 1-2 VIEW COMPARISON:  11/24/2013 FINDINGS: Two screws are noted within the proximal right tibia. No visible complicating feature. IMPRESSION: Placement of proximal tibial screws without visible complicating feature. Electronically Signed   By: Rolm Baptise M.D.   On: 11/24/2015 15:07   Dg C-arm 1-60  Min  Result Date: 11/24/2015 CLINICAL DATA:  OPEN REDUCTION INTERNAL FIXATION (ORIF) TIBIA FRACTURE (Right) Radiation safety time out done by [facility] LMP verified by [facility] Fluoro time 10 seconds EXAM: DG C-ARM 61-120 MIN COMPARISON:  None. FINDINGS: Two Cortical screws span the tibial plateau. IMPRESSION: Internal fixation of tibial plateau fracture. Electronically Signed   By: Suzy Bouchard M.D.   On: 11/24/2015 15:51    Assessment & Plan:   There are no diagnoses linked to this encounter. I am having Ms. Montanaro maintain her aspirin, albuterol, Suvorexant, nortriptyline, eletriptan, ranitidine, SAPHRIS, DULoxetine, pregabalin, orphenadrine, linaclotide, traMADol, lisdexamfetamine, HYDROcodone-acetaminophen, PROAIR HFA, budesonide-formoterol, meloxicam, metoprolol, pantoprazole, and fluconazole.  No orders of the defined types were placed in this encounter.    Follow-up: No Follow-up on file.  Alex  Cumi Sanagustin, MD

## 2016-02-20 NOTE — Assessment & Plan Note (Signed)
Proair prn

## 2016-02-20 NOTE — Progress Notes (Signed)
Pre visit review using our clinic review tool, if applicable. No additional management support is needed unless otherwise documented below in the visit note. 

## 2016-02-20 NOTE — Assessment & Plan Note (Signed)
Better  

## 2016-02-20 NOTE — Assessment & Plan Note (Signed)
Nortriptyline Relpax prn

## 2016-02-20 NOTE — Assessment & Plan Note (Signed)
Zantac 

## 2016-02-20 NOTE — Addendum Note (Signed)
Addended by: Cassandria Anger on: 02/20/2016 08:13 AM   Modules accepted: Orders

## 2016-02-21 ENCOUNTER — Other Ambulatory Visit: Payer: Self-pay | Admitting: Internal Medicine

## 2016-02-23 ENCOUNTER — Other Ambulatory Visit: Payer: Self-pay | Admitting: *Deleted

## 2016-02-23 MED ORDER — NORTRIPTYLINE HCL 25 MG PO CAPS: 25.0000 mg | ORAL_CAPSULE | Freq: Every day | ORAL | 1 refills | Status: DC

## 2016-03-26 DIAGNOSIS — M47817 Spondylosis without myelopathy or radiculopathy, lumbosacral region: Secondary | ICD-10-CM | POA: Diagnosis not present

## 2016-03-26 DIAGNOSIS — R0782 Intercostal pain: Secondary | ICD-10-CM | POA: Diagnosis not present

## 2016-03-26 DIAGNOSIS — M5416 Radiculopathy, lumbar region: Secondary | ICD-10-CM | POA: Diagnosis not present

## 2016-03-26 DIAGNOSIS — Z79899 Other long term (current) drug therapy: Secondary | ICD-10-CM | POA: Diagnosis not present

## 2016-03-26 DIAGNOSIS — M791 Myalgia: Secondary | ICD-10-CM | POA: Diagnosis not present

## 2016-03-26 DIAGNOSIS — G894 Chronic pain syndrome: Secondary | ICD-10-CM | POA: Diagnosis not present

## 2016-03-26 DIAGNOSIS — M25561 Pain in right knee: Secondary | ICD-10-CM | POA: Diagnosis not present

## 2016-03-26 DIAGNOSIS — Z79891 Long term (current) use of opiate analgesic: Secondary | ICD-10-CM | POA: Diagnosis not present

## 2016-03-26 DIAGNOSIS — M25562 Pain in left knee: Secondary | ICD-10-CM | POA: Diagnosis not present

## 2016-04-17 ENCOUNTER — Other Ambulatory Visit: Payer: Self-pay | Admitting: Internal Medicine

## 2016-04-17 DIAGNOSIS — Z803 Family history of malignant neoplasm of breast: Secondary | ICD-10-CM

## 2016-04-17 DIAGNOSIS — Z1231 Encounter for screening mammogram for malignant neoplasm of breast: Secondary | ICD-10-CM

## 2016-04-22 DIAGNOSIS — M791 Myalgia: Secondary | ICD-10-CM | POA: Diagnosis not present

## 2016-04-22 DIAGNOSIS — G894 Chronic pain syndrome: Secondary | ICD-10-CM | POA: Diagnosis not present

## 2016-04-22 DIAGNOSIS — M5416 Radiculopathy, lumbar region: Secondary | ICD-10-CM | POA: Diagnosis not present

## 2016-04-22 DIAGNOSIS — R0782 Intercostal pain: Secondary | ICD-10-CM | POA: Diagnosis not present

## 2016-04-22 DIAGNOSIS — M542 Cervicalgia: Secondary | ICD-10-CM | POA: Diagnosis not present

## 2016-04-22 DIAGNOSIS — Z79891 Long term (current) use of opiate analgesic: Secondary | ICD-10-CM | POA: Diagnosis not present

## 2016-04-22 DIAGNOSIS — M47817 Spondylosis without myelopathy or radiculopathy, lumbosacral region: Secondary | ICD-10-CM | POA: Diagnosis not present

## 2016-04-22 DIAGNOSIS — M25561 Pain in right knee: Secondary | ICD-10-CM | POA: Diagnosis not present

## 2016-04-22 DIAGNOSIS — M25562 Pain in left knee: Secondary | ICD-10-CM | POA: Diagnosis not present

## 2016-05-06 ENCOUNTER — Ambulatory Visit: Payer: Self-pay | Admitting: Internal Medicine

## 2016-05-16 ENCOUNTER — Ambulatory Visit
Admission: RE | Admit: 2016-05-16 | Discharge: 2016-05-16 | Disposition: A | Discharge status: Home / Self Care | Payer: Medicare Other | Source: Ambulatory Visit | Attending: Internal Medicine | Admitting: Internal Medicine

## 2016-05-16 DIAGNOSIS — Z1231 Encounter for screening mammogram for malignant neoplasm of breast: Secondary | ICD-10-CM

## 2016-05-16 DIAGNOSIS — Z803 Family history of malignant neoplasm of breast: Secondary | ICD-10-CM

## 2016-05-20 DIAGNOSIS — M25562 Pain in left knee: Secondary | ICD-10-CM | POA: Diagnosis not present

## 2016-05-20 DIAGNOSIS — M5416 Radiculopathy, lumbar region: Secondary | ICD-10-CM | POA: Diagnosis not present

## 2016-05-20 DIAGNOSIS — M25561 Pain in right knee: Secondary | ICD-10-CM | POA: Diagnosis not present

## 2016-05-20 DIAGNOSIS — G894 Chronic pain syndrome: Secondary | ICD-10-CM | POA: Diagnosis not present

## 2016-05-20 DIAGNOSIS — R0782 Intercostal pain: Secondary | ICD-10-CM | POA: Diagnosis not present

## 2016-05-20 DIAGNOSIS — M542 Cervicalgia: Secondary | ICD-10-CM | POA: Diagnosis not present

## 2016-05-20 DIAGNOSIS — Z79891 Long term (current) use of opiate analgesic: Secondary | ICD-10-CM | POA: Diagnosis not present

## 2016-05-20 DIAGNOSIS — M47817 Spondylosis without myelopathy or radiculopathy, lumbosacral region: Secondary | ICD-10-CM | POA: Diagnosis not present

## 2016-05-20 DIAGNOSIS — M791 Myalgia: Secondary | ICD-10-CM | POA: Diagnosis not present

## 2016-06-12 ENCOUNTER — Encounter: Payer: Self-pay | Admitting: Gynecology

## 2016-06-13 DIAGNOSIS — M25561 Pain in right knee: Secondary | ICD-10-CM | POA: Diagnosis not present

## 2016-06-13 DIAGNOSIS — M25562 Pain in left knee: Secondary | ICD-10-CM | POA: Diagnosis not present

## 2016-06-13 DIAGNOSIS — A6923 Arthritis due to Lyme disease: Secondary | ICD-10-CM | POA: Diagnosis not present

## 2016-06-13 DIAGNOSIS — M542 Cervicalgia: Secondary | ICD-10-CM | POA: Diagnosis not present

## 2016-06-13 DIAGNOSIS — M5031 Other cervical disc degeneration,  high cervical region: Secondary | ICD-10-CM | POA: Diagnosis not present

## 2016-06-17 DIAGNOSIS — M25561 Pain in right knee: Secondary | ICD-10-CM | POA: Diagnosis not present

## 2016-06-17 DIAGNOSIS — M5031 Other cervical disc degeneration,  high cervical region: Secondary | ICD-10-CM | POA: Diagnosis not present

## 2016-06-17 DIAGNOSIS — M542 Cervicalgia: Secondary | ICD-10-CM | POA: Diagnosis not present

## 2016-06-17 DIAGNOSIS — M47817 Spondylosis without myelopathy or radiculopathy, lumbosacral region: Secondary | ICD-10-CM | POA: Diagnosis not present

## 2016-06-17 DIAGNOSIS — M791 Myalgia: Secondary | ICD-10-CM | POA: Diagnosis not present

## 2016-06-17 DIAGNOSIS — R0782 Intercostal pain: Secondary | ICD-10-CM | POA: Diagnosis not present

## 2016-06-17 DIAGNOSIS — M5416 Radiculopathy, lumbar region: Secondary | ICD-10-CM | POA: Diagnosis not present

## 2016-06-17 DIAGNOSIS — M25562 Pain in left knee: Secondary | ICD-10-CM | POA: Diagnosis not present

## 2016-06-17 DIAGNOSIS — A6923 Arthritis due to Lyme disease: Secondary | ICD-10-CM | POA: Diagnosis not present

## 2016-06-18 DIAGNOSIS — Z79899 Other long term (current) drug therapy: Secondary | ICD-10-CM | POA: Diagnosis not present

## 2016-06-26 DIAGNOSIS — M25562 Pain in left knee: Secondary | ICD-10-CM | POA: Diagnosis not present

## 2016-06-26 DIAGNOSIS — M25561 Pain in right knee: Secondary | ICD-10-CM | POA: Diagnosis not present

## 2016-06-26 DIAGNOSIS — M222X1 Patellofemoral disorders, right knee: Secondary | ICD-10-CM | POA: Diagnosis not present

## 2016-06-26 DIAGNOSIS — M222X2 Patellofemoral disorders, left knee: Secondary | ICD-10-CM | POA: Diagnosis not present

## 2016-07-15 DIAGNOSIS — M25561 Pain in right knee: Secondary | ICD-10-CM | POA: Diagnosis not present

## 2016-07-15 DIAGNOSIS — M5031 Other cervical disc degeneration,  high cervical region: Secondary | ICD-10-CM | POA: Diagnosis not present

## 2016-07-15 DIAGNOSIS — R0782 Intercostal pain: Secondary | ICD-10-CM | POA: Diagnosis not present

## 2016-07-15 DIAGNOSIS — M542 Cervicalgia: Secondary | ICD-10-CM | POA: Diagnosis not present

## 2016-07-15 DIAGNOSIS — M5416 Radiculopathy, lumbar region: Secondary | ICD-10-CM | POA: Diagnosis not present

## 2016-07-15 DIAGNOSIS — M47817 Spondylosis without myelopathy or radiculopathy, lumbosacral region: Secondary | ICD-10-CM | POA: Diagnosis not present

## 2016-07-15 DIAGNOSIS — G894 Chronic pain syndrome: Secondary | ICD-10-CM | POA: Diagnosis not present

## 2016-07-15 DIAGNOSIS — M25562 Pain in left knee: Secondary | ICD-10-CM | POA: Diagnosis not present

## 2016-07-15 DIAGNOSIS — M791 Myalgia: Secondary | ICD-10-CM | POA: Diagnosis not present

## 2016-07-25 ENCOUNTER — Other Ambulatory Visit: Payer: Self-pay | Admitting: Internal Medicine

## 2016-08-06 DIAGNOSIS — Z79899 Other long term (current) drug therapy: Secondary | ICD-10-CM | POA: Diagnosis not present

## 2016-08-12 DIAGNOSIS — M542 Cervicalgia: Secondary | ICD-10-CM | POA: Diagnosis not present

## 2016-08-12 DIAGNOSIS — M25561 Pain in right knee: Secondary | ICD-10-CM | POA: Diagnosis not present

## 2016-08-12 DIAGNOSIS — M47817 Spondylosis without myelopathy or radiculopathy, lumbosacral region: Secondary | ICD-10-CM | POA: Diagnosis not present

## 2016-08-12 DIAGNOSIS — M25562 Pain in left knee: Secondary | ICD-10-CM | POA: Diagnosis not present

## 2016-08-12 DIAGNOSIS — M5031 Other cervical disc degeneration,  high cervical region: Secondary | ICD-10-CM | POA: Diagnosis not present

## 2016-08-12 DIAGNOSIS — R0782 Intercostal pain: Secondary | ICD-10-CM | POA: Diagnosis not present

## 2016-08-12 DIAGNOSIS — A6923 Arthritis due to Lyme disease: Secondary | ICD-10-CM | POA: Diagnosis not present

## 2016-08-12 DIAGNOSIS — M791 Myalgia: Secondary | ICD-10-CM | POA: Diagnosis not present

## 2016-08-12 DIAGNOSIS — M5416 Radiculopathy, lumbar region: Secondary | ICD-10-CM | POA: Diagnosis not present

## 2016-08-19 ENCOUNTER — Encounter: Payer: Self-pay | Admitting: Internal Medicine

## 2016-08-19 ENCOUNTER — Ambulatory Visit (INDEPENDENT_AMBULATORY_CARE_PROVIDER_SITE_OTHER): Payer: Medicare Other | Admitting: Internal Medicine

## 2016-08-19 ENCOUNTER — Other Ambulatory Visit (INDEPENDENT_AMBULATORY_CARE_PROVIDER_SITE_OTHER): Payer: Medicare Other

## 2016-08-19 DIAGNOSIS — I471 Supraventricular tachycardia, unspecified: Secondary | ICD-10-CM | POA: Insufficient documentation

## 2016-08-19 DIAGNOSIS — F988 Other specified behavioral and emotional disorders with onset usually occurring in childhood and adolescence: Secondary | ICD-10-CM

## 2016-08-19 DIAGNOSIS — R635 Abnormal weight gain: Secondary | ICD-10-CM

## 2016-08-19 DIAGNOSIS — F17200 Nicotine dependence, unspecified, uncomplicated: Secondary | ICD-10-CM

## 2016-08-19 DIAGNOSIS — D751 Secondary polycythemia: Secondary | ICD-10-CM | POA: Diagnosis not present

## 2016-08-19 DIAGNOSIS — I10 Essential (primary) hypertension: Secondary | ICD-10-CM | POA: Diagnosis not present

## 2016-08-19 DIAGNOSIS — F4320 Adjustment disorder, unspecified: Secondary | ICD-10-CM | POA: Diagnosis not present

## 2016-08-19 DIAGNOSIS — F4321 Adjustment disorder with depressed mood: Secondary | ICD-10-CM

## 2016-08-19 LAB — CBC WITH DIFFERENTIAL/PLATELET
BASOS ABS: 0.1 10*3/uL (ref 0.0–0.1)
BASOS PCT: 0.8 % (ref 0.0–3.0)
EOS ABS: 0.2 10*3/uL (ref 0.0–0.7)
Eosinophils Relative: 2.3 % (ref 0.0–5.0)
HEMATOCRIT: 46.1 % — AB (ref 36.0–46.0)
HEMOGLOBIN: 16 g/dL — AB (ref 12.0–15.0)
LYMPHS PCT: 27.9 % (ref 12.0–46.0)
Lymphs Abs: 2.3 10*3/uL (ref 0.7–4.0)
MCHC: 34.7 g/dL (ref 30.0–36.0)
MCV: 91 fl (ref 78.0–100.0)
MONO ABS: 1.1 10*3/uL — AB (ref 0.1–1.0)
Monocytes Relative: 12.7 % — ABNORMAL HIGH (ref 3.0–12.0)
Neutro Abs: 4.7 10*3/uL (ref 1.4–7.7)
Neutrophils Relative %: 56.3 % (ref 43.0–77.0)
Platelets: 247 10*3/uL (ref 150.0–400.0)
RBC: 5.06 Mil/uL (ref 3.87–5.11)
RDW: 12.7 % (ref 11.5–15.5)
WBC: 8.4 10*3/uL (ref 4.0–10.5)

## 2016-08-19 LAB — BASIC METABOLIC PANEL
BUN: 11 mg/dL (ref 6–23)
CHLORIDE: 100 meq/L (ref 96–112)
CO2: 26 mEq/L (ref 19–32)
Calcium: 9.7 mg/dL (ref 8.4–10.5)
Creatinine, Ser: 0.67 mg/dL (ref 0.40–1.20)
GFR: 101.75 mL/min (ref >60.00)
GLUCOSE: 107 mg/dL — AB (ref 70–99)
POTASSIUM: 4.5 meq/L (ref 3.5–5.1)
SODIUM: 135 meq/L (ref 135–145)

## 2016-08-19 LAB — TSH: TSH: 3.02 u[IU]/mL (ref 0.35–4.50)

## 2016-08-19 LAB — HEPATIC FUNCTION PANEL
ALK PHOS: 128 U/L — AB (ref 39–117)
ALT: 14 U/L (ref 0–35)
AST: 14 U/L (ref 0–37)
Albumin: 4.4 g/dL (ref 3.5–5.2)
BILIRUBIN DIRECT: 0 mg/dL (ref 0.0–0.3)
Total Bilirubin: 0.4 mg/dL (ref 0.2–1.2)
Total Protein: 7.5 g/dL (ref 6.0–8.3)

## 2016-08-19 NOTE — Assessment & Plan Note (Signed)
Adderall - pls d/c due to tachycardia  Potential benefits of a long term stimulants use as well as potential risks  and complications were explained to the patient and were aknowledged.

## 2016-08-19 NOTE — Assessment & Plan Note (Signed)
Metoprolol 

## 2016-08-19 NOTE — Progress Notes (Signed)
Subjective:  Patient ID: [name], female    DOB: 1970-07-23  Age: 46 y.o. MRN: [id number]  CC: No chief complaint on file.   HPI [name] presents for rapid HR>120 in am's. C/o dizziness w/it and on it's own on occasion. It has been happening 2/d. Rapid HR - 3 or 4/wk x2 mo. Taking Adderall 3/wk when working. No LOC. No CP. F/u polycythemia  Outpatient Medications Prior to Visit  Medication Sig Dispense Refill  . albuterol (PROVENTIL) (2.5 MG/3ML) 0.083% nebulizer solution Take 3 mLs (2.5 mg total) by nebulization every 6 (six) hours as needed. For shortness of breath. 75 mL 0  . Asenapine Maleate (SAPHRIS) 10 MG SUBL Place under the tongue at bedtime.    [name] aspirin (BAYER ASPIRIN) 325 MG tablet Take 1 tablet (325 mg total) by mouth daily. 100 tablet 3  . budesonide-formoterol (SYMBICORT) 160-4.5 MCG/ACT inhaler INHALE 2 PUFFS BY MOUTH TWICE A DAY 10.2 Inhaler 5  . DULoxetine (CYMBALTA) 60 MG capsule Take 60 mg by mouth daily.     [name] eletriptan (RELPAX) 40 MG tablet Take 1 tablet (40 mg total) by mouth as needed for migraine or headache. May repeat in 2 hours if headache persists or recurs. 15 tablet 6  . gabapentin (NEURONTIN) 100 MG capsule Take 1 capsule (100 mg total) by mouth 3 (three) times daily. 270 capsule 1  . HYDROcodone-acetaminophen (NORCO/VICODIN) 5-325 MG tablet Take 1-2 tablets by mouth every 4 (four) hours as needed for moderate pain. (Patient taking differently: Take 1-2 tablets by mouth every 4 (four) hours as needed for moderate pain. 1/2-1 tablet every day PRN, Moderate pain) 30 tablet 0  . linaclotide (LINZESS) 145 MCG CAPS capsule Take 1-2 capsules (145-290 mcg total) by mouth daily as needed. (Patient taking differently: Take 145 mcg by mouth 2 (two) times daily. ) 60 capsule 11  . meloxicam (MOBIC) 15 MG tablet Take 1 tablet (15 mg total) by mouth daily. 90 tablet 1  . metoprolol (LOPRESSOR) 100 MG tablet Take 1 tablet (100 mg total) by mouth 2 (two)  times daily. 180 tablet 3  . nortriptyline (PAMELOR) 25 MG capsule Take 1 capsule (25 mg total) by mouth at bedtime. 90 capsule 1  . pantoprazole (PROTONIX) 40 MG tablet Take 1 tablet (40 mg total) by mouth daily. 90 tablet 3  . PROAIR HFA 108 (90 Base) MCG/ACT inhaler INHALE 2 PUFFS BY MOUTH EVERY 4 HOURS AS NEEDED FOR SHORTNESS OF BREATH 8.5 Inhaler 2  . ranitidine (ZANTAC) 300 MG tablet Take 1 tablet (300 mg total) by mouth at bedtime. 90 tablet 3  . traMADol (ULTRAM) 50 MG tablet Limit 1 tablet by mouth 4 - 8  times per day if tolerated 240 tablet 0  . fluconazole (DIFLUCAN) 150 MG tablet TAKE 1 TABLET BY MOUTH EVERY 3 DAYS AS NEEDED 2 tablet 0  . lisdexamfetamine (VYVANSE) 20 MG capsule Take 20 mg by mouth daily.    . orphenadrine (NORFLEX) 100 MG tablet Limit 1 tab by mouth per day or twice per day if tolerated 60 tablet 2  . Suvorexant (BELSOMRA) 15 MG TABS Take 15 mg by mouth at bedtime as needed. 30 tablet 5   No facility-administered medications prior to visit.     ROS Review of Systems  Constitutional: Negative for activity change, appetite change, chills, fatigue and unexpected weight change.  HENT: Negative for congestion, mouth sores and sinus pressure.   Eyes: Negative for visual disturbance.  Respiratory: Negative  for cough and chest tightness.   Cardiovascular: Positive for palpitations.  Gastrointestinal: Negative for abdominal pain and nausea.  Genitourinary: Negative for difficulty urinating, frequency and vaginal pain.  Musculoskeletal: Negative for back pain and gait problem.  Skin: Negative for pallor and rash.  Neurological: Negative for dizziness, tremors, weakness, numbness and headaches.  Psychiatric/Behavioral: Positive for decreased concentration. Negative for confusion and sleep disturbance. The patient is nervous/anxious.     Objective:  BP 126/84 (BP Location: Left Arm, Patient Position: Sitting, Cuff Size: Large)   Pulse 80   Temp 98.4 F (36.9 C)  (Oral)   Ht 5\' 5"  (1.651 m)   Wt 197 lb (89.4 kg)   SpO2 99%   BMI 32.78 kg/m   BP Readings from Last 3 Encounters:  08/19/16 126/84  02/20/16 134/76  11/24/15 134/82    Wt Readings from Last 3 Encounters:  08/19/16 197 lb (89.4 kg)  02/20/16 203 lb 8 oz (92.3 kg)  11/24/15 205 lb (93 kg)    Physical Exam  Constitutional: She appears well-developed. No distress.  HENT:  Head: Normocephalic.  Right Ear: External ear normal.  Left Ear: External ear normal.  Nose: Nose normal.  Mouth/Throat: Oropharynx is clear and moist.  Eyes: Pupils are equal, round, and reactive to light. Conjunctivae are normal. Right eye exhibits no discharge. Left eye exhibits no discharge.  Neck: Normal range of motion. Neck supple. No JVD present. No tracheal deviation present. No thyromegaly present.  Cardiovascular: Normal rate, regular rhythm and normal heart sounds.   Pulmonary/Chest: No stridor. No respiratory distress. She has no wheezes.  Abdominal: Soft. Bowel sounds are normal. She exhibits no distension and no mass. There is no tenderness. There is no rebound and no guarding.  Musculoskeletal: She exhibits no edema or tenderness.  Lymphadenopathy:    She has no cervical adenopathy.  Neurological: She displays normal reflexes. No cranial nerve deficit. She exhibits normal muscle tone. Coordination normal.  Skin: No rash noted. No erythema.  Psychiatric: She has a normal mood and affect. Her behavior is normal. Judgment and thought content normal.   Procedure: EKG Indication: tachycardia Impression: S tachy, HR 80. No acute changes.  Lab Results  Component Value Date   WBC 7.6 01/24/2016   HGB 14.1 01/24/2016   HCT 41.3 01/24/2016   PLT 213.0 01/24/2016   GLUCOSE 110 (H) 01/24/2016   CHOL 207 (H) 08/07/2015   TRIG 127.0 08/07/2015   HDL 38.40 (L) 08/07/2015   LDLDIRECT 161.1 02/18/2013   LDLCALC 144 (H) 08/07/2015   ALT 16 01/24/2016   AST 15 01/24/2016   NA 140 01/24/2016   K  4.0 01/24/2016   CL 105 01/24/2016   CREATININE 0.68 01/24/2016   BUN 12 01/24/2016   CO2 26 01/24/2016   TSH 1.24 08/07/2015   INR 1.05 08/29/2014   HGBA1C 5.7 05/05/2015    Mm Screening Breast Tomo Bilateral  Result Date: 05/16/2016 CLINICAL DATA:  Screening. EXAM: 2D DIGITAL SCREENING BILATERAL MAMMOGRAM WITH CAD AND ADJUNCT TOMO COMPARISON:  Previous exam(s). ACR Breast Density Category b: There are scattered areas of fibroglandular density. FINDINGS: There are no findings suspicious for malignancy. Images were processed with CAD. IMPRESSION: No mammographic evidence of malignancy. A result letter of this screening mammogram will be mailed directly to the patient. RECOMMENDATION: Screening mammogram in one year. (Code:SM-B-01Y) BI-RADS CATEGORY  1: Negative. Electronically Signed   By: Abelardo Diesel M.D.   On: 05/16/2016 14:41    Assessment & Plan:  There are no diagnoses linked to this encounter. I have discontinued Ms. Beaupre's orphenadrine, lisdexamfetamine, fluconazole, and Suvorexant. I am also having her maintain her aspirin, albuterol, eletriptan, SAPHRIS, DULoxetine, linaclotide, traMADol, HYDROcodone-acetaminophen, PROAIR HFA, budesonide-formoterol, pantoprazole, metoprolol tartrate, meloxicam, ranitidine, gabapentin, nortriptyline, amphetamine-dextroamphetamine, cyclobenzaprine, and zolpidem.  Meds ordered this encounter  Medications  . amphetamine-dextroamphetamine (ADDERALL) 10 MG tablet    Sig: Take 10 mg by mouth daily.    Refill:  0  . cyclobenzaprine (FLEXERIL) 10 MG tablet    Sig: TAKE 1/2 - 1 TABLET BY MOUTH DAILY OR TWICE DAILY IF TOLERATED    Refill:  0  . zolpidem (AMBIEN) 5 MG tablet    Sig: Take 5 mg by mouth daily.    Refill:  1     Follow-up: No Follow-up on file.  Walker Kehr, MD

## 2016-08-19 NOTE — Assessment & Plan Note (Signed)
Mother died in May 2017. Grieving discussed

## 2016-08-19 NOTE — Assessment & Plan Note (Signed)
3 cigs/d

## 2016-08-19 NOTE — Assessment & Plan Note (Signed)
Adderall - pls d/c due to tachycardia Toprol

## 2016-08-19 NOTE — Assessment & Plan Note (Signed)
Wt Readings from Last 3 Encounters:  08/19/16 197 lb (89.4 kg)  02/20/16 203 lb 8 oz (92.3 kg)  11/24/15 205 lb (93 kg)

## 2016-08-19 NOTE — Assessment & Plan Note (Signed)
CBC

## 2016-08-20 ENCOUNTER — Other Ambulatory Visit: Payer: Self-pay | Admitting: Internal Medicine

## 2016-08-20 DIAGNOSIS — D751 Secondary polycythemia: Secondary | ICD-10-CM

## 2016-08-21 ENCOUNTER — Other Ambulatory Visit: Payer: Self-pay

## 2016-08-21 DIAGNOSIS — D751 Secondary polycythemia: Secondary | ICD-10-CM

## 2016-09-08 ENCOUNTER — Emergency Department: Payer: Medicare Other

## 2016-09-08 ENCOUNTER — Emergency Department
Admission: EM | Admit: 2016-09-08 | Discharge: 2016-09-08 | Disposition: A | Discharge status: Home / Self Care | Payer: Medicare Other | Attending: Emergency Medicine | Admitting: Emergency Medicine

## 2016-09-08 DIAGNOSIS — M791 Myalgia: Secondary | ICD-10-CM | POA: Insufficient documentation

## 2016-09-08 DIAGNOSIS — J45909 Unspecified asthma, uncomplicated: Secondary | ICD-10-CM | POA: Insufficient documentation

## 2016-09-08 DIAGNOSIS — M549 Dorsalgia, unspecified: Secondary | ICD-10-CM | POA: Diagnosis not present

## 2016-09-08 DIAGNOSIS — W108XXA Fall (on) (from) other stairs and steps, initial encounter: Secondary | ICD-10-CM | POA: Diagnosis not present

## 2016-09-08 DIAGNOSIS — F17210 Nicotine dependence, cigarettes, uncomplicated: Secondary | ICD-10-CM | POA: Diagnosis not present

## 2016-09-08 DIAGNOSIS — S299XXA Unspecified injury of thorax, initial encounter: Secondary | ICD-10-CM | POA: Diagnosis not present

## 2016-09-08 DIAGNOSIS — I10 Essential (primary) hypertension: Secondary | ICD-10-CM | POA: Diagnosis not present

## 2016-09-08 DIAGNOSIS — Z79899 Other long term (current) drug therapy: Secondary | ICD-10-CM | POA: Diagnosis not present

## 2016-09-08 DIAGNOSIS — M542 Cervicalgia: Secondary | ICD-10-CM | POA: Diagnosis not present

## 2016-09-08 DIAGNOSIS — M7918 Myalgia, other site: Secondary | ICD-10-CM

## 2016-09-08 MED ORDER — ORPHENADRINE CITRATE 30 MG/ML IJ SOLN
60.0000 mg | Freq: Two times a day (BID) | INTRAMUSCULAR | Status: DC
  Administered 2016-09-08: 60 mg via INTRAMUSCULAR
  Filled 2016-09-08: qty 2

## 2016-09-08 MED ORDER — KETOROLAC TROMETHAMINE 60 MG/2ML IM SOLN
30.0000 mg | Freq: Once | INTRAMUSCULAR | Status: AC
  Administered 2016-09-08: 30 mg via INTRAMUSCULAR
  Filled 2016-09-08: qty 2

## 2016-09-08 NOTE — ED Triage Notes (Signed)
FIRST NURSE NOTE-ambulatory to check in. No distress noted.  C/o back pain

## 2016-09-08 NOTE — ED Notes (Signed)
See triage note  States she fell and hit her mid back  Increased pain with inspiration and movement

## 2016-09-08 NOTE — ED Triage Notes (Signed)
Pt presents via POV c/o mid back pain and painful inspiration s/p fall yesterday. Denies LOC.

## 2016-09-08 NOTE — ED Provider Notes (Signed)
[facility] Emergency Department Provider Note ____________________________________________  Time seen: Approximately 10:49 AM  I have reviewed the triage vital signs and the nursing notes.   HISTORY  Chief Complaint No chief complaint on file.    HPI RAVYNN HOGATE is a 46 y.o. female who presents to the emergency department for evaluation and treatment of back pain after a mechanical, non-syncopal fall from approximately 4 feet in the air yesterday. She states that she slipped off the ramp that goes into her house and landed on her back. Today, she has pain on the right side of the neck and mid back. No relief with her home medications that include meloxicam and Flexeril.  Past Medical History:  Diagnosis Date  . Anxiety state, unspecified   . Arthritis   . Asthma   . Attention deficit disorder without mention of hyperactivity   . Bipolar disorder ([place])   . Constipation    takes Linzess  . Depressive disorder, not elsewhere classified   . Esophageal reflux   . Hypertension   . Migraine without aura, without mention of intractable migraine without mention of status migrainosus   . Pain in joint, site unspecified   . Pneumonia   . Polycythemia    per pt  . Unspecified asthma(493.90)   . Unspecified essential hypertension   . Unspecified vitamin D deficiency     Patient Active Problem List   Diagnosis Date Noted  . Paroxysmal SVT (supraventricular tachycardia) ([place]) 08/19/2016  . Grief 08/07/2015  . Acute upper respiratory infection 05/17/2015  . Insomnia due to stress 01/04/2015  . Polycythemia, secondary 11/22/2014  . Moderate bipolar I disorder, current or most recent episode depressed, with mixed features ([place]) 09/07/2014  . Thoracic facet syndrome ([place]) 08/31/2014  . Chondromalacia 07/20/2014  . Bipolar 1 disorder ([place]) 07/20/2014  . ADD (attention deficit disorder) 07/20/2014  . Generalized anxiety disorder 07/20/2014  . DJD  (degenerative joint disease) of knee 07/19/2014  . Knee sprain and strain 07/05/2014  . DDD (degenerative disc disease), thoracic 07/03/2014  . Intercostal neuralgia 07/03/2014  . DDD (degenerative disc disease), lumbosacral 07/03/2014  . Lumbar facet joint pain 07/03/2014  . Chronic female pelvic pain 10/07/2013  . Well adult exam 10/01/2013  . Ovarian cyst, right 08/04/2013  . Painful sexual intercourse 06/29/2013  . Chronic constipation 04/01/2013  . Narcolepsy 04/02/2012  . LBP (low back pain) 04/02/2012  . Depression 10/22/2010  . Eczema 05/31/2010  . Hyperglycemia 04/28/2010  . GERD 09/28/2009  . Migraine without aura 08/10/2009  . ARTHRALGIA 08/10/2009  . FATIGUE 05/02/2009  . SMOKER 11/14/2008  . ABSCESS, SUPRAPUBIC 09/08/2008  . Attention deficit disorder 10/08/2007  . PARESTHESIA 10/08/2007  . WEIGHT GAIN 02/06/2007  . Vitamin D deficiency 12/16/2006  . Anxiety state 11/12/2006  . Essential hypertension 11/12/2006  . Asthma 11/12/2006    Past Surgical History:  Procedure Laterality Date  . ABDOMINAL HYSTERECTOMY    . abdominal ultrasound  10/12/97  . APPENDECTOMY  05/27/2013   gangrenous  . INCISE AND DRAIN ABCESS  2011   Right axilla- Dr. Barkley Bruns  . KNEE ARTHROSCOPY     both knees  . KNEE ARTHROSCOPY WITH FULKERSON SLIDE Right 10/17/2015   Procedure: KNEE ARTHROSCOPY WITH Elmarie Mainland;  Surgeon: Melrose Nakayama, MD;  Location: [facility];  Service: Orthopedics;  Laterality: Right;  . NM MYOCAR PERF WALL MOTION  11/2009   bruce myoview; perfusion defect in anterior region consistent with breast attenuation; remaining myocardium with normal perfusion; post-stress  EF 74%; low risk scan   . ORIF TIBIA FRACTURE Right 11/24/2015   Procedure: OPEN REDUCTION INTERNAL FIXATION (ORIF) TIBIA FRACTURE;  Surgeon: Melrose Nakayama, MD;  Location: [facility];  Service: Orthopedics;  Laterality: Right;  . PARTIAL HYSTERECTOMY  2007  . PLANTAR FASCIA SURGERY  2000  . TONSILLECTOMY   04/29/00  . TONSILLECTOMY Bilateral   . TRANSTHORACIC ECHOCARDIOGRAM  11/2009   EF=>55%; trace MR & TR    Prior to Admission medications   Medication Sig Start Date End Date Taking? Authorizing Provider  albuterol (PROVENTIL) (2.5 MG/3ML) 0.083% nebulizer solution Take 3 mLs (2.5 mg total) by nebulization every 6 (six) hours as needed. For shortness of breath. 06/23/14   Plotnikov, Evie Lacks, MD  amphetamine-dextroamphetamine (ADDERALL) 10 MG tablet Take 10 mg by mouth daily. 08/09/16   [provider]  Asenapine Maleate (SAPHRIS) 10 MG SUBL Place under the tongue at bedtime.    [provider]  aspirin (BAYER ASPIRIN) 325 MG tablet Take 1 tablet (325 mg total) by mouth daily. 03/16/14   Plotnikov, Evie Lacks, MD  budesonide-formoterol (SYMBICORT) 160-4.5 MCG/ACT inhaler INHALE 2 PUFFS BY MOUTH TWICE A DAY 12/06/15   Plotnikov, Evie Lacks, MD  cyclobenzaprine (FLEXERIL) 10 MG tablet TAKE 1/2 - 1 TABLET BY MOUTH DAILY OR TWICE DAILY IF TOLERATED 07/16/16   [provider]  DULoxetine (CYMBALTA) 60 MG capsule Take 60 mg by mouth daily.  07/27/15   [provider]  eletriptan (RELPAX) 40 MG tablet Take 1 tablet (40 mg total) by mouth as needed for migraine or headache. May repeat in 2 hours if headache persists or recurs. 05/25/15   Dennie Bible, NP  gabapentin (NEURONTIN) 100 MG capsule Take 1 capsule (100 mg total) by mouth 3 (three) times daily. 02/20/16   Plotnikov, Evie Lacks, MD  HYDROcodone-acetaminophen (NORCO/VICODIN) 5-325 MG tablet Take 1-2 tablets by mouth every 4 (four) hours as needed for moderate pain. Patient taking differently: Take 1-2 tablets by mouth every 4 (four) hours as needed for moderate pain. 1/2-1 tablet every day PRN, Moderate pain 11/24/15   Loni Dolly, PA-C  linaclotide [facility]) 145 MCG CAPS capsule Take 1-2 capsules (145-290 mcg total) by mouth daily as needed. Patient taking differently: Take 145 mcg by mouth 2 (two) times daily.   09/27/15   Plotnikov, Evie Lacks, MD  meloxicam (MOBIC) 15 MG tablet Take 1 tablet (15 mg total) by mouth daily. 02/20/16   Plotnikov, Evie Lacks, MD  metoprolol (LOPRESSOR) 100 MG tablet Take 1 tablet (100 mg total) by mouth 2 (two) times daily. 02/20/16   Plotnikov, Evie Lacks, MD  nortriptyline (PAMELOR) 25 MG capsule Take 1 capsule (25 mg total) by mouth at bedtime. 02/23/16   Plotnikov, Evie Lacks, MD  pantoprazole (PROTONIX) 40 MG tablet Take 1 tablet (40 mg total) by mouth daily. 02/20/16   Plotnikov, Evie Lacks, MD  PROAIR HFA 108 (90 Base) MCG/ACT inhaler INHALE 2 PUFFS BY MOUTH EVERY 4 HOURS AS NEEDED FOR SHORTNESS OF BREATH 12/06/15   Plotnikov, Evie Lacks, MD  ranitidine (ZANTAC) 300 MG tablet Take 1 tablet (300 mg total) by mouth at bedtime. 02/20/16   Plotnikov, Evie Lacks, MD  traMADol (ULTRAM) 50 MG tablet Limit 1 tablet by mouth 4 - 8  times per day if tolerated 09/27/15   Mohammed Kindle, MD  zolpidem (AMBIEN) 5 MG tablet Take 5 mg by mouth daily. 08/05/16   [provider]    Allergies Lisinopril; Amoxicillin; Ancef [cefazolin]; Doxycycline; Hydrochlorothiazide;  Penicillins; Percocet [oxycodone-acetaminophen]; and Talwin [pentazocine]  Family History  Problem Relation Age of Onset  . Breast cancer Mother   . Multiple sclerosis Mother   . Cancer Mother 33       breast ca  . Depression Mother   . Lupus Sister        PTSD  . Alcohol abuse Sister   . Bipolar disorder Sister   . Heart disease Father        cardiac arrest  . Alcohol abuse Brother        also HTN, lupus, enlarged heart  . Heart disease Maternal Grandfather        cardiac arrest  . Diabetes Paternal Grandfather   . Heart disease Paternal Grandfather   . Asthma Unknown        Family history  . Coronary artery disease Unknown        1st degree female < 50  . Diabetes Unknown        1st degree relative  . COPD Paternal Grandmother   . ADD / ADHD Child   . Asthma Child     Social History Social History   Substance Use Topics  . Smoking status: Current Every Day Smoker    Packs/day: 1.00    Years: 27.00    Types: Cigarettes    Start date: 10/31/1986    Last attempt to quit: 11/22/2014  . Smokeless tobacco: Never Used     Comment: trying to quit  . Alcohol use No    Review of Systems Constitutional: Well appearing. Cardiovascular: Negative for change in skin temperature or color. Respiratory: Negative for dyspnea. Musculoskeletal:              Positive for neck pain.  Negative for fecal incontinence,  Saddle anesthesia, or urinary retention  Negative for immunosuppression, IV drug use, or fever  Negative for chronic steroid use   Negative for trauma in the presence of osteoporosis  Negative for age over 51 and trauma.  Negative for constitutional symptoms, or history of cancer   Negative for pain worse at night.  Negative for focal neurologic deficit, progressive, or disabling symptoms. Skin: Negative for rash, lesion, or wound.  Neurological: Negative for burning, tingling, numb, electric, radiating pain.  ____________________________________________   PHYSICAL EXAM:  VITAL SIGNS: ED Triage Vitals  Enc Vitals Group     BP 09/08/16 0916 134/86     Pulse Rate 09/08/16 0916 86     Resp 09/08/16 0916 13     Temp 09/08/16 0916 98.2 F (36.8 C)     Temp Source 09/08/16 0916 Oral     SpO2 09/08/16 0916 96 %     Weight 09/08/16 0916 197 lb (89.4 kg)     Height 09/08/16 0916 5\' 6"  (1.676 m)     Head Circumference --      Peak Flow --      Pain Score 09/08/16 0919 6     Pain Loc --      Pain Edu? --      Excl. in [place]? --     Constitutional: Alert and oriented. Well appearing and in no acute distress. Eyes: Conjunctivae are clear without discharge or drainage.  Head: Atraumatic. Neck: Full, active range of motion. Respiratory: Respirations even and unlabored. Musculoskeletal: Full ROM, Strength 5/5 of the lower extremities as tested. Tenderness over the right trapezius  and right parathoracic vertebra. No midline tenderness or step-off deformities palpable. Nexus criteria is negative. Neurologic: Reflexes of  the lower extremities are 2+. Negative straight leg raise on the right or left side. Skin: Atraumatic.  Psychiatric: Behavior and affect are normal.  ____________________________________________   LABS (all labs ordered are listed, but only abnormal results are displayed)  Labs Reviewed - No data to display ____________________________________________  RADIOLOGY  Thoracic spine images negative for acute bony abnormality per radiology. ____________________________________________   PROCEDURES  Procedure(s) performed: None  ____________________________________________   INITIAL IMPRESSION / ASSESSMENT AND PLAN / ED COURSE  LA SHEHAN is a 46 y.o. female who presents to the emergency department for treatment evaluation of neck and back pain after a mechanical, non-syncopal fall yesterday. She was given Norflex and Toradol while in the emergency department today with little pain relief. She states that she has Flexeril and meloxicam home and will continue to take those medications as prescribed.  Patient was advised to follow up with her primary care provider or orthopedics in about a week if not improving or return to the ER for symptoms that change or worsen if unable to schedule an appointment.  Pertinent labs & imaging results that were available during my care of the patient were reviewed by me and considered in my medical decision making (see chart for details).  _________________________________________   FINAL CLINICAL IMPRESSION(S) / ED DIAGNOSES  Final diagnoses:  Musculoskeletal pain    Discharge Medication List as of 09/08/2016 12:08 PM      If controlled substance prescribed during this visit, 12 month history viewed on the [place] prior to issuing an initial prescription for Schedule II or III opiod.    Victorino Dike, FNP 09/08/16 1330    Harvest Dark, MD 09/08/16 1439

## 2016-09-08 NOTE — Discharge Instructions (Signed)
Apply heat or ice, whichever feels better to you. Follow up with your primary care provider for symptoms that are not improving over the week. Return to the ER for symptoms that change or worsen if unable to schedule an appointment.

## 2016-09-09 DIAGNOSIS — A6923 Arthritis due to Lyme disease: Secondary | ICD-10-CM | POA: Diagnosis not present

## 2016-09-09 DIAGNOSIS — M542 Cervicalgia: Secondary | ICD-10-CM | POA: Diagnosis not present

## 2016-09-09 DIAGNOSIS — M5031 Other cervical disc degeneration,  high cervical region: Secondary | ICD-10-CM | POA: Diagnosis not present

## 2016-09-09 DIAGNOSIS — M25562 Pain in left knee: Secondary | ICD-10-CM | POA: Diagnosis not present

## 2016-09-09 DIAGNOSIS — R0782 Intercostal pain: Secondary | ICD-10-CM | POA: Diagnosis not present

## 2016-09-09 DIAGNOSIS — M791 Myalgia: Secondary | ICD-10-CM | POA: Diagnosis not present

## 2016-09-09 DIAGNOSIS — M5416 Radiculopathy, lumbar region: Secondary | ICD-10-CM | POA: Diagnosis not present

## 2016-09-09 DIAGNOSIS — M25561 Pain in right knee: Secondary | ICD-10-CM | POA: Diagnosis not present

## 2016-09-09 DIAGNOSIS — M47817 Spondylosis without myelopathy or radiculopathy, lumbosacral region: Secondary | ICD-10-CM | POA: Diagnosis not present

## 2016-09-10 ENCOUNTER — Other Ambulatory Visit (HOSPITAL_COMMUNITY): Payer: Self-pay | Admitting: Pain Medicine

## 2016-09-10 DIAGNOSIS — M25511 Pain in right shoulder: Secondary | ICD-10-CM

## 2016-09-13 ENCOUNTER — Ambulatory Visit
Admission: RE | Admit: 2016-09-13 | Discharge: 2016-09-13 | Disposition: A | Discharge status: Home / Self Care | Payer: Medicare Other | Source: Ambulatory Visit | Attending: Pain Medicine | Admitting: Pain Medicine

## 2016-09-13 DIAGNOSIS — M25511 Pain in right shoulder: Secondary | ICD-10-CM | POA: Diagnosis not present

## 2016-09-13 DIAGNOSIS — S40011A Contusion of right shoulder, initial encounter: Secondary | ICD-10-CM | POA: Diagnosis not present

## 2016-09-25 DIAGNOSIS — M222X1 Patellofemoral disorders, right knee: Secondary | ICD-10-CM | POA: Diagnosis not present

## 2016-09-27 ENCOUNTER — Other Ambulatory Visit: Payer: Self-pay | Admitting: Internal Medicine

## 2016-10-08 DIAGNOSIS — R0782 Intercostal pain: Secondary | ICD-10-CM | POA: Diagnosis not present

## 2016-10-08 DIAGNOSIS — M47817 Spondylosis without myelopathy or radiculopathy, lumbosacral region: Secondary | ICD-10-CM | POA: Diagnosis not present

## 2016-10-08 DIAGNOSIS — M791 Myalgia: Secondary | ICD-10-CM | POA: Diagnosis not present

## 2016-10-08 DIAGNOSIS — M5416 Radiculopathy, lumbar region: Secondary | ICD-10-CM | POA: Diagnosis not present

## 2016-11-05 DIAGNOSIS — M5416 Radiculopathy, lumbar region: Secondary | ICD-10-CM | POA: Diagnosis not present

## 2016-11-05 DIAGNOSIS — G894 Chronic pain syndrome: Secondary | ICD-10-CM | POA: Diagnosis not present

## 2016-11-05 DIAGNOSIS — Z79891 Long term (current) use of opiate analgesic: Secondary | ICD-10-CM | POA: Diagnosis not present

## 2016-11-05 DIAGNOSIS — M7062 Trochanteric bursitis, left hip: Secondary | ICD-10-CM | POA: Diagnosis not present

## 2016-11-05 DIAGNOSIS — M7061 Trochanteric bursitis, right hip: Secondary | ICD-10-CM | POA: Diagnosis not present

## 2016-11-05 DIAGNOSIS — Z5181 Encounter for therapeutic drug level monitoring: Secondary | ICD-10-CM | POA: Diagnosis not present

## 2016-11-05 DIAGNOSIS — M47817 Spondylosis without myelopathy or radiculopathy, lumbosacral region: Secondary | ICD-10-CM | POA: Diagnosis not present

## 2016-11-05 DIAGNOSIS — R0782 Intercostal pain: Secondary | ICD-10-CM | POA: Diagnosis not present

## 2016-11-11 DIAGNOSIS — Z79899 Other long term (current) drug therapy: Secondary | ICD-10-CM | POA: Diagnosis not present

## 2016-12-04 ENCOUNTER — Other Ambulatory Visit (INDEPENDENT_AMBULATORY_CARE_PROVIDER_SITE_OTHER): Payer: Medicare Other

## 2016-12-04 DIAGNOSIS — M546 Pain in thoracic spine: Secondary | ICD-10-CM | POA: Diagnosis not present

## 2016-12-04 DIAGNOSIS — M5136 Other intervertebral disc degeneration, lumbar region: Secondary | ICD-10-CM | POA: Diagnosis not present

## 2016-12-04 DIAGNOSIS — D751 Secondary polycythemia: Secondary | ICD-10-CM

## 2016-12-04 DIAGNOSIS — Z5181 Encounter for therapeutic drug level monitoring: Secondary | ICD-10-CM | POA: Diagnosis not present

## 2016-12-04 DIAGNOSIS — M5134 Other intervertebral disc degeneration, thoracic region: Secondary | ICD-10-CM | POA: Diagnosis not present

## 2016-12-04 LAB — CBC WITH DIFFERENTIAL/PLATELET
BASOS PCT: 0.8 % (ref 0.0–3.0)
Basophils Absolute: 0.1 10*3/uL (ref 0.0–0.1)
EOS ABS: 0.2 10*3/uL (ref 0.0–0.7)
Eosinophils Relative: 2.7 % (ref 0.0–5.0)
HEMATOCRIT: 45.1 % (ref 36.0–46.0)
Hemoglobin: 15.3 g/dL — ABNORMAL HIGH (ref 12.0–15.0)
LYMPHS PCT: 29.8 % (ref 12.0–46.0)
Lymphs Abs: 2.7 10*3/uL (ref 0.7–4.0)
MCHC: 33.9 g/dL (ref 30.0–36.0)
MCV: 89.6 fl (ref 78.0–100.0)
Monocytes Absolute: 1 10*3/uL (ref 0.1–1.0)
Monocytes Relative: 11.3 % (ref 3.0–12.0)
NEUTROS ABS: 5 10*3/uL (ref 1.4–7.7)
Neutrophils Relative %: 55.4 % (ref 43.0–77.0)
PLATELETS: 242 10*3/uL (ref 150.0–400.0)
RBC: 5.03 Mil/uL (ref 3.87–5.11)
RDW: 13.2 % (ref 11.5–15.5)
WBC: 9 10*3/uL (ref 4.0–10.5)

## 2017-01-01 DIAGNOSIS — G894 Chronic pain syndrome: Secondary | ICD-10-CM | POA: Diagnosis not present

## 2017-01-01 DIAGNOSIS — M7061 Trochanteric bursitis, right hip: Secondary | ICD-10-CM | POA: Diagnosis not present

## 2017-01-01 DIAGNOSIS — Z5181 Encounter for therapeutic drug level monitoring: Secondary | ICD-10-CM | POA: Diagnosis not present

## 2017-01-01 DIAGNOSIS — M7062 Trochanteric bursitis, left hip: Secondary | ICD-10-CM | POA: Diagnosis not present

## 2017-01-30 ENCOUNTER — Encounter: Payer: Self-pay | Admitting: Internal Medicine

## 2017-01-30 ENCOUNTER — Ambulatory Visit (INDEPENDENT_AMBULATORY_CARE_PROVIDER_SITE_OTHER): Payer: Medicare Other | Admitting: Internal Medicine

## 2017-01-30 ENCOUNTER — Other Ambulatory Visit (INDEPENDENT_AMBULATORY_CARE_PROVIDER_SITE_OTHER): Payer: Medicare Other

## 2017-01-30 VITALS — BP 122/76 | HR 64 | Temp 98.4°F | Ht 66.0 in | Wt 202.0 lb

## 2017-01-30 DIAGNOSIS — Z0000 Encounter for general adult medical examination without abnormal findings: Secondary | ICD-10-CM

## 2017-01-30 DIAGNOSIS — R739 Hyperglycemia, unspecified: Secondary | ICD-10-CM

## 2017-01-30 DIAGNOSIS — B360 Pityriasis versicolor: Secondary | ICD-10-CM

## 2017-01-30 LAB — CBC WITH DIFFERENTIAL/PLATELET
BASOS PCT: 0.9 % (ref 0.0–3.0)
Basophils Absolute: 0.1 10*3/uL (ref 0.0–0.1)
EOS PCT: 2.6 % (ref 0.0–5.0)
Eosinophils Absolute: 0.2 10*3/uL (ref 0.0–0.7)
HEMATOCRIT: 45.5 % (ref 36.0–46.0)
HEMOGLOBIN: 15.4 g/dL — AB (ref 12.0–15.0)
Lymphocytes Relative: 31.6 % (ref 12.0–46.0)
Lymphs Abs: 2.6 10*3/uL (ref 0.7–4.0)
MCHC: 33.8 g/dL (ref 30.0–36.0)
MCV: 89.2 fl (ref 78.0–100.0)
MONOS PCT: 10.7 % (ref 3.0–12.0)
Monocytes Absolute: 0.9 10*3/uL (ref 0.1–1.0)
Neutro Abs: 4.4 10*3/uL (ref 1.4–7.7)
Neutrophils Relative %: 54.2 % (ref 43.0–77.0)
Platelets: 251 10*3/uL (ref 150.0–400.0)
RBC: 5.1 Mil/uL (ref 3.87–5.11)
RDW: 13.2 % (ref 11.5–15.5)
WBC: 8.2 10*3/uL (ref 4.0–10.5)

## 2017-01-30 LAB — URINALYSIS
Bilirubin Urine: NEGATIVE
Hgb urine dipstick: NEGATIVE
Ketones, ur: NEGATIVE
Leukocytes, UA: NEGATIVE
NITRITE: NEGATIVE
PH: 6.5 (ref 5.0–8.0)
TOTAL PROTEIN, URINE-UPE24: NEGATIVE
Urine Glucose: NEGATIVE
Urobilinogen, UA: 0.2 (ref 0.0–1.0)

## 2017-01-30 LAB — BASIC METABOLIC PANEL
BUN: 13 mg/dL (ref 6–23)
CALCIUM: 9.3 mg/dL (ref 8.4–10.5)
CO2: 29 mEq/L (ref 19–32)
Chloride: 102 mEq/L (ref 96–112)
Creatinine, Ser: 0.62 mg/dL (ref 0.40–1.20)
GFR: 111.05 mL/min (ref >60.00)
GLUCOSE: 96 mg/dL (ref 70–99)
Potassium: 4.4 mEq/L (ref 3.5–5.1)
SODIUM: 139 meq/L (ref 135–145)

## 2017-01-30 LAB — LIPID PANEL
CHOLESTEROL: 173 mg/dL (ref 0–200)
HDL: 32.8 mg/dL — ABNORMAL LOW (ref >39.00)
LDL Cholesterol: 110 mg/dL — ABNORMAL HIGH (ref 0–99)
NONHDL: 140.23
Total CHOL/HDL Ratio: 5
Triglycerides: 150 mg/dL — ABNORMAL HIGH (ref 0.0–149.0)
VLDL: 30 mg/dL (ref 0.0–40.0)

## 2017-01-30 LAB — HEPATIC FUNCTION PANEL
ALK PHOS: 125 U/L — AB (ref 39–117)
ALT: 16 U/L (ref 0–35)
AST: 12 U/L (ref 0–37)
Albumin: 4.2 g/dL (ref 3.5–5.2)
BILIRUBIN DIRECT: 0 mg/dL (ref 0.0–0.3)
TOTAL PROTEIN: 7.4 g/dL (ref 6.0–8.3)
Total Bilirubin: 0.4 mg/dL (ref 0.2–1.2)

## 2017-01-30 LAB — TSH: TSH: 2.24 u[IU]/mL (ref 0.35–4.50)

## 2017-01-30 LAB — HEMOGLOBIN A1C: Hgb A1c MFr Bld: 5.6 % (ref 4.6–6.5)

## 2017-01-30 MED ORDER — KETOCONAZOLE 200 MG PO TABS: 200.0000 mg | ORAL_TABLET | Freq: Every day | ORAL | 0 refills | Status: DC

## 2017-01-30 MED ORDER — KETOCONAZOLE 2 % EX CREA: 1.0000 "application " | TOPICAL_CREAM | Freq: Every day | CUTANEOUS | 1 refills | Status: DC

## 2017-01-30 NOTE — Assessment & Plan Note (Signed)
Ketoconazole po/cream

## 2017-01-30 NOTE — Progress Notes (Signed)
Subjective:  Patient ID: [name], female    DOB: 03/03/1970  Age: 47 y.o. MRN: [id number]  CC: No chief complaint on file.   HPI [name] presents for a rash on trunk x months; no itching; more red at times  Outpatient Medications Prior to Visit  Medication Sig Dispense Refill  . albuterol (PROVENTIL) (2.5 MG/3ML) 0.083% nebulizer solution Take 3 mLs (2.5 mg total) by nebulization every 6 (six) hours as needed. For shortness of breath. 75 mL 0  . amphetamine-dextroamphetamine (ADDERALL) 10 MG tablet Take 10 mg by mouth daily.  0  . Asenapine Maleate (SAPHRIS) 10 MG SUBL Place under the tongue at bedtime.    [name] aspirin (BAYER ASPIRIN) 325 MG tablet Take 1 tablet (325 mg total) by mouth daily. 100 tablet 3  . budesonide-formoterol (SYMBICORT) 160-4.5 MCG/ACT inhaler INHALE 2 PUFFS BY MOUTH TWICE A DAY 10.2 Inhaler 5  . cyclobenzaprine (FLEXERIL) 10 MG tablet TAKE 1/2 - 1 TABLET BY MOUTH DAILY OR TWICE DAILY IF TOLERATED  0  . DULoxetine (CYMBALTA) 60 MG capsule Take 60 mg by mouth daily.     [name] eletriptan (RELPAX) 40 MG tablet Take 1 tablet (40 mg total) by mouth as needed for migraine or headache. May repeat in 2 hours if headache persists or recurs. 15 tablet 6  . gabapentin (NEURONTIN) 100 MG capsule TAKE 1 CAPSULE BY MOUTH 3  TIMES DAILY 270 capsule 1  . HYDROcodone-acetaminophen (NORCO/VICODIN) 5-325 MG tablet Take 1-2 tablets by mouth every 4 (four) hours as needed for moderate pain. (Patient taking differently: Take 1-2 tablets by mouth every 4 (four) hours as needed for moderate pain. 1/2-1 tablet every day PRN, Moderate pain) 30 tablet 0  . linaclotide (LINZESS) 145 MCG CAPS capsule Take 1-2 capsules (145-290 mcg total) by mouth daily as needed. (Patient taking differently: Take 145 mcg by mouth 2 (two) times daily. ) 60 capsule 11  . meloxicam (MOBIC) 15 MG tablet TAKE 1 TABLET BY MOUTH  DAILY 90 tablet 1  . metoprolol (LOPRESSOR) 100 MG tablet Take 1 tablet (100 mg  total) by mouth 2 (two) times daily. 180 tablet 3  . nortriptyline (PAMELOR) 25 MG capsule TAKE 1 CAPSULE BY MOUTH AT  BEDTIME 90 capsule 1  . pantoprazole (PROTONIX) 40 MG tablet Take 1 tablet (40 mg total) by mouth daily. 90 tablet 3  . PROAIR HFA 108 (90 Base) MCG/ACT inhaler INHALE 2 PUFFS BY MOUTH EVERY 4 HOURS AS NEEDED FOR SHORTNESS OF BREATH 8.5 Inhaler 2  . ranitidine (ZANTAC) 300 MG tablet Take 1 tablet (300 mg total) by mouth at bedtime. 90 tablet 3  . traMADol (ULTRAM) 50 MG tablet Limit 1 tablet by mouth 4 - 8  times per day if tolerated 240 tablet 0  . zolpidem (AMBIEN) 5 MG tablet Take 5 mg by mouth daily.  1   No facility-administered medications prior to visit.     ROS Review of Systems  Constitutional: Negative for activity change, appetite change, chills, fatigue and unexpected weight change.  HENT: Negative for congestion, mouth sores and sinus pressure.   Eyes: Negative for visual disturbance.  Respiratory: Negative for cough and chest tightness.   Gastrointestinal: Negative for abdominal pain and nausea.  Genitourinary: Negative for difficulty urinating, frequency and vaginal pain.  Musculoskeletal: Negative for back pain and gait problem.  Skin: Positive for rash. Negative for pallor.  Neurological: Negative for dizziness, tremors, weakness, numbness and headaches.  Psychiatric/Behavioral: Negative for confusion and  sleep disturbance.    Objective:  BP 122/76 (BP Location: Right Arm, Patient Position: Sitting, Cuff Size: Large)   Pulse 64   Temp 98.4 F (36.9 C) (Oral)   Ht 5\' 6"  (1.676 m)   Wt 202 lb (91.6 kg)   SpO2 97%   BMI 32.60 kg/m   BP Readings from Last 3 Encounters:  01/30/17 122/76  09/08/16 134/86  08/19/16 126/84    Wt Readings from Last 3 Encounters:  01/30/17 202 lb (91.6 kg)  09/08/16 197 lb (89.4 kg)  08/19/16 197 lb (89.4 kg)    Physical Exam  Constitutional: She appears well-developed. No distress.  HENT:  Head:  Normocephalic.  Right Ear: External ear normal.  Left Ear: External ear normal.  Nose: Nose normal.  Mouth/Throat: Oropharynx is clear and moist.  Eyes: Conjunctivae are normal. Pupils are equal, round, and reactive to light. Right eye exhibits no discharge. Left eye exhibits no discharge.  Neck: Normal range of motion. Neck supple. No JVD present. No tracheal deviation present. No thyromegaly present.  Cardiovascular: Normal rate, regular rhythm and normal heart sounds.  Pulmonary/Chest: No stridor. No respiratory distress. She has no wheezes.  Abdominal: Soft. Bowel sounds are normal. She exhibits no distension and no mass. There is no tenderness. There is no rebound and no guarding.  Musculoskeletal: She exhibits no edema or tenderness.  Lymphadenopathy:    She has no cervical adenopathy.  Neurological: She displays normal reflexes. No cranial nerve deficit. She exhibits normal muscle tone. Coordination normal.  Skin: Rash noted. No erythema.  Psychiatric: She has a normal mood and affect. Her behavior is normal. Judgment and thought content normal.   Scaly rash patches on trunk  Lab Results  Component Value Date   WBC 9.0 12/04/2016   HGB 15.3 (H) 12/04/2016   HCT 45.1 12/04/2016   PLT 242.0 12/04/2016   GLUCOSE 107 (H) 08/19/2016   CHOL 207 (H) 08/07/2015   TRIG 127.0 08/07/2015   HDL 38.40 (L) 08/07/2015   LDLDIRECT 161.1 02/18/2013   LDLCALC 144 (H) 08/07/2015   ALT 14 08/19/2016   AST 14 08/19/2016   NA 135 08/19/2016   K 4.5 08/19/2016   CL 100 08/19/2016   CREATININE 0.67 08/19/2016   BUN 11 08/19/2016   CO2 26 08/19/2016   TSH 3.02 08/19/2016   INR 1.05 08/29/2014   HGBA1C 5.7 05/05/2015    Mr Shoulder Right Wo Contrast  Result Date: 09/13/2016 CLINICAL DATA:  Generalized shoulder pain since falling last Saturday. Limited range of motion. No previous relevant surgery. EXAM: MRI OF THE RIGHT SHOULDER WITHOUT CONTRAST TECHNIQUE: Multiplanar, multisequence MR  imaging of the shoulder was performed. No intravenous contrast was administered. COMPARISON:  None. FINDINGS: Rotator cuff: Mild supraspinatus and infraspinatus tendinosis without evidence of tear. The subscapularis and teres minor tendons appear normal. Muscles: There is no focal muscular atrophy or edema within the rotator cuff musculature. There is focal edema/contusion laterally in the deltoid musculature. Biceps long head:  Intact and normally positioned. Acromioclavicular Joint: The acromion is type 1. There are mild acromioclavicular degenerative changes. No significant fluid is present in the subacromial - subdeltoid bursa. Glenohumeral Joint: No significant shoulder joint effusion or glenohumeral arthropathy. Labrum:  No evidence of labral tear. Bones: No acute or significant extra-articular osseous findings. Other: There is prominent subcutaneous edema superficial to the deltoid musculature posterolaterally. This is incompletely visualized by this examination, but no focal hematoma is identified. IMPRESSION: 1. Soft tissue contusion posterolaterally with subcutaneous and  deltoid muscular edema/hemorrhage. No focal hematoma identified. 2. The rotator cuff is intact. There is mild supraspinatus and infraspinatus tendinosis. 3. The labrum and biceps tendon appear intact. No acute osseous findings. Electronically Signed   By: Richardean Sale M.D.   On: 09/13/2016 08:47    Assessment & Plan:   There are no diagnoses linked to this encounter. I am having [name] maintain her aspirin, albuterol, eletriptan, SAPHRIS, DULoxetine, linaclotide, traMADol, HYDROcodone-acetaminophen, PROAIR HFA, budesonide-formoterol, pantoprazole, metoprolol tartrate, ranitidine, amphetamine-dextroamphetamine, cyclobenzaprine, zolpidem, meloxicam, nortriptyline, and gabapentin.  No orders of the defined types were placed in this encounter.    Follow-up: No Follow-up on file.  Walker Kehr, MD

## 2017-01-31 DIAGNOSIS — M7061 Trochanteric bursitis, right hip: Secondary | ICD-10-CM | POA: Diagnosis not present

## 2017-01-31 DIAGNOSIS — M7062 Trochanteric bursitis, left hip: Secondary | ICD-10-CM | POA: Diagnosis not present

## 2017-01-31 DIAGNOSIS — G894 Chronic pain syndrome: Secondary | ICD-10-CM | POA: Diagnosis not present

## 2017-01-31 DIAGNOSIS — Z5181 Encounter for therapeutic drug level monitoring: Secondary | ICD-10-CM | POA: Diagnosis not present

## 2017-02-11 DIAGNOSIS — Z79899 Other long term (current) drug therapy: Secondary | ICD-10-CM | POA: Diagnosis not present

## 2017-02-19 ENCOUNTER — Ambulatory Visit (INDEPENDENT_AMBULATORY_CARE_PROVIDER_SITE_OTHER): Payer: Medicare Other | Admitting: Internal Medicine

## 2017-02-19 ENCOUNTER — Encounter: Payer: Self-pay | Admitting: Internal Medicine

## 2017-02-19 VITALS — BP 116/72 | HR 74 | Temp 98.3°F | Ht 66.0 in | Wt 201.0 lb

## 2017-02-19 DIAGNOSIS — B360 Pityriasis versicolor: Secondary | ICD-10-CM | POA: Diagnosis not present

## 2017-02-19 DIAGNOSIS — Z0000 Encounter for general adult medical examination without abnormal findings: Secondary | ICD-10-CM

## 2017-02-19 DIAGNOSIS — R748 Abnormal levels of other serum enzymes: Secondary | ICD-10-CM

## 2017-02-19 DIAGNOSIS — J209 Acute bronchitis, unspecified: Secondary | ICD-10-CM | POA: Diagnosis not present

## 2017-02-19 DIAGNOSIS — Z0001 Encounter for general adult medical examination with abnormal findings: Secondary | ICD-10-CM

## 2017-02-19 DIAGNOSIS — F411 Generalized anxiety disorder: Secondary | ICD-10-CM

## 2017-02-19 MED ORDER — AZITHROMYCIN 250 MG PO TABS: ORAL_TABLET | ORAL | 0 refills | Status: DC

## 2017-02-19 MED ORDER — PROMETHAZINE-CODEINE 6.25-10 MG/5ML PO SYRP: 5.0000 mL | ORAL_SOLUTION | ORAL | 0 refills | Status: DC | PRN

## 2017-02-19 MED ORDER — FLUCONAZOLE 100 MG PO TABS: ORAL_TABLET | ORAL | 1 refills | Status: DC

## 2017-02-19 NOTE — Assessment & Plan Note (Signed)
Here for medicare wellness/physical  Diet: heart healthy  Physical activity: not sedentary  Depression/mood screen: negative  Hearing: intact to whispered voice  Visual acuity: grossly normal, performs annual eye exam  ADLs: capable  Fall risk: low to none  Home safety: good  Cognitive evaluation: intact to orientation, naming, recall and repetition  EOL planning: adv directives, full code/ I agree  I have personally reviewed and have noted  1. The patient's medical, surgical and social history  2. Their use of alcohol, tobacco or illicit drugs  3. Their current medications and supplements  4. The patient's functional ability including ADL's, fall risks, home safety risks and hearing or visual impairment.  5. Diet and physical activities  6. Evidence for depression or mood disorders - bipollar depression 7. The roster of all physicians providing medical care to patient - is listed in the Snapshot section of the chart and reviewed today.    Today patient counseled on age appropriate routine health concerns for screening and prevention, each reviewed and up to date or declined. Immunizations reviewed and up to date or declined. Labs ordered and reviewed. Risk factors for depression reviewed and negative. Hearing function and visual acuity are intact. ADLs screened and addressed as needed. Functional ability and level of safety reviewed and appropriate. Education, counseling and referrals performed based on assessed risks today. Patient provided with a copy of personalized plan for preventive services.  Prevnar later

## 2017-02-19 NOTE — Assessment & Plan Note (Signed)
Po Diflucan

## 2017-02-19 NOTE — Assessment & Plan Note (Signed)
Zpac Prom-cod 

## 2017-02-19 NOTE — Assessment & Plan Note (Signed)
Dr  Vena Rua (Psychiatry)

## 2017-02-19 NOTE — Progress Notes (Signed)
Subjective:  Patient ID: [name], female    DOB: 1971-01-05  Age: 47 y.o. MRN: [id number]  CC: No chief complaint on file.   HPI [name] presents for a well exam/[facility] well  C/o sinusitis and bronchitis sx's x1 week - green mucus; can't sleep  Pt felt drunk after 1 pill of Nizoral...stopped   Outpatient Medications Prior to Visit  Medication Sig Dispense Refill  . albuterol (PROVENTIL) (2.5 MG/3ML) 0.083% nebulizer solution Take 3 mLs (2.5 mg total) by nebulization every 6 (six) hours as needed. For shortness of breath. 75 mL 0  . amphetamine-dextroamphetamine (ADDERALL) 10 MG tablet Take 10 mg by mouth daily.  0  . Asenapine Maleate (SAPHRIS) 10 MG SUBL Place under the tongue at bedtime.    [name] aspirin (BAYER ASPIRIN) 325 MG tablet Take 1 tablet (325 mg total) by mouth daily. 100 tablet 3  . budesonide-formoterol (SYMBICORT) 160-4.5 MCG/ACT inhaler INHALE 2 PUFFS BY MOUTH TWICE A DAY 10.2 Inhaler 5  . cyclobenzaprine (FLEXERIL) 10 MG tablet TAKE 1/2 - 1 TABLET BY MOUTH DAILY OR TWICE DAILY IF TOLERATED  0  . DULoxetine (CYMBALTA) 60 MG capsule Take 60 mg by mouth daily.     [name] eletriptan (RELPAX) 40 MG tablet Take 1 tablet (40 mg total) by mouth as needed for migraine or headache. May repeat in 2 hours if headache persists or recurs. 15 tablet 6  . gabapentin (NEURONTIN) 100 MG capsule TAKE 1 CAPSULE BY MOUTH 3  TIMES DAILY 270 capsule 1  . HYDROcodone-acetaminophen (NORCO/VICODIN) 5-325 MG tablet Take 1-2 tablets by mouth every 4 (four) hours as needed for moderate pain. (Patient taking differently: Take 1-2 tablets by mouth every 4 (four) hours as needed for moderate pain. 1/2-1 tablet every day PRN, Moderate pain) 30 tablet 0  . ketoconazole (NIZORAL) 2 % cream Apply 1 application topically daily. 45 g 1  . linaclotide (LINZESS) 145 MCG CAPS capsule Take 1-2 capsules (145-290 mcg total) by mouth daily as needed. (Patient taking differently: Take 145 mcg by mouth 2 (two)  times daily. ) 60 capsule 11  . meloxicam (MOBIC) 15 MG tablet TAKE 1 TABLET BY MOUTH  DAILY 90 tablet 1  . metoprolol (LOPRESSOR) 100 MG tablet Take 1 tablet (100 mg total) by mouth 2 (two) times daily. 180 tablet 3  . nortriptyline (PAMELOR) 25 MG capsule TAKE 1 CAPSULE BY MOUTH AT  BEDTIME 90 capsule 1  . pantoprazole (PROTONIX) 40 MG tablet Take 1 tablet (40 mg total) by mouth daily. 90 tablet 3  . PROAIR HFA 108 (90 Base) MCG/ACT inhaler INHALE 2 PUFFS BY MOUTH EVERY 4 HOURS AS NEEDED FOR SHORTNESS OF BREATH 8.5 Inhaler 2  . ranitidine (ZANTAC) 300 MG tablet Take 1 tablet (300 mg total) by mouth at bedtime. 90 tablet 3  . traMADol (ULTRAM) 50 MG tablet Limit 1 tablet by mouth 4 - 8  times per day if tolerated 240 tablet 0  . zolpidem (AMBIEN) 5 MG tablet Take 5 mg by mouth daily.  1  . ketoconazole (NIZORAL) 200 MG tablet Take 1 tablet (200 mg total) by mouth daily. (Patient not taking: Reported on 02/19/2017) 10 tablet 0   No facility-administered medications prior to visit.     ROS Review of Systems  Constitutional: Positive for fatigue. Negative for activity change, appetite change, chills and unexpected weight change.  HENT: Positive for congestion, postnasal drip and sinus pain. Negative for mouth sores and sinus pressure.   Eyes:  Negative for visual disturbance.  Respiratory: Positive for cough, shortness of breath and wheezing. Negative for chest tightness.   Gastrointestinal: Negative for abdominal pain and nausea.  Genitourinary: Negative for difficulty urinating, frequency and vaginal pain.  Musculoskeletal: Negative for back pain and gait problem.  Skin: Positive for color change and rash. Negative for pallor.  Neurological: Negative for dizziness, tremors, weakness, numbness and headaches.  Psychiatric/Behavioral: Negative for confusion and sleep disturbance.    Objective:  BP 116/72 (BP Location: Left Arm, Patient Position: Sitting, Cuff Size: Large)   Pulse 74   Temp  98.3 F (36.8 C) (Oral)   Ht 5\' 6"  (1.676 m)   Wt 201 lb (91.2 kg)   SpO2 100%   BMI 32.44 kg/m   BP Readings from Last 3 Encounters:  02/19/17 116/72  01/30/17 122/76  09/08/16 134/86    Wt Readings from Last 3 Encounters:  02/19/17 201 lb (91.2 kg)  01/30/17 202 lb (91.6 kg)  09/08/16 197 lb (89.4 kg)    Physical Exam  Constitutional: She appears well-developed. No distress.  HENT:  Head: Normocephalic.  Right Ear: External ear normal.  Left Ear: External ear normal.  Nose: Nose normal.  Mouth/Throat: Oropharynx is clear and moist.  Eyes: Conjunctivae are normal. Pupils are equal, round, and reactive to light. Right eye exhibits no discharge. Left eye exhibits no discharge.  Neck: Normal range of motion. Neck supple. No JVD present. No tracheal deviation present. No thyromegaly present.  Cardiovascular: Normal rate, regular rhythm and normal heart sounds.  Pulmonary/Chest: No stridor. No respiratory distress. She has wheezes.  Abdominal: Soft. Bowel sounds are normal. She exhibits no distension and no mass. There is no tenderness. There is no rebound and no guarding.  Musculoskeletal: She exhibits no edema or tenderness.  Lymphadenopathy:    She has no cervical adenopathy.  Neurological: She displays normal reflexes. No cranial nerve deficit. She exhibits normal muscle tone. Coordination normal.  Skin: Rash noted. No erythema.  Psychiatric: She has a normal mood and affect. Her behavior is normal. Judgment and thought content normal.  eryth throat  Lab Results  Component Value Date   WBC 8.2 01/30/2017   HGB 15.4 (H) 01/30/2017   HCT 45.5 01/30/2017   PLT 251.0 01/30/2017   GLUCOSE 96 01/30/2017   CHOL 173 01/30/2017   TRIG 150.0 (H) 01/30/2017   HDL 32.80 (L) 01/30/2017   LDLDIRECT 161.1 02/18/2013   LDLCALC 110 (H) 01/30/2017   ALT 16 01/30/2017   AST 12 01/30/2017   NA 139 01/30/2017   K 4.4 01/30/2017   CL 102 01/30/2017   CREATININE 0.62 01/30/2017    BUN 13 01/30/2017   CO2 29 01/30/2017   TSH 2.24 01/30/2017   INR 1.05 08/29/2014   HGBA1C 5.6 01/30/2017    Mr Shoulder Right Wo Contrast  Result Date: 09/13/2016 CLINICAL DATA:  Generalized shoulder pain since falling last Saturday. Limited range of motion. No previous relevant surgery. EXAM: MRI OF THE RIGHT SHOULDER WITHOUT CONTRAST TECHNIQUE: Multiplanar, multisequence MR imaging of the shoulder was performed. No intravenous contrast was administered. COMPARISON:  None. FINDINGS: Rotator cuff: Mild supraspinatus and infraspinatus tendinosis without evidence of tear. The subscapularis and teres minor tendons appear normal. Muscles: There is no focal muscular atrophy or edema within the rotator cuff musculature. There is focal edema/contusion laterally in the deltoid musculature. Biceps long head:  Intact and normally positioned. Acromioclavicular Joint: The acromion is type 1. There are mild acromioclavicular degenerative changes. No significant fluid  is present in the subacromial - subdeltoid bursa. Glenohumeral Joint: No significant shoulder joint effusion or glenohumeral arthropathy. Labrum:  No evidence of labral tear. Bones: No acute or significant extra-articular osseous findings. Other: There is prominent subcutaneous edema superficial to the deltoid musculature posterolaterally. This is incompletely visualized by this examination, but no focal hematoma is identified. IMPRESSION: 1. Soft tissue contusion posterolaterally with subcutaneous and deltoid muscular edema/hemorrhage. No focal hematoma identified. 2. The rotator cuff is intact. There is mild supraspinatus and infraspinatus tendinosis. 3. The labrum and biceps tendon appear intact. No acute osseous findings. Electronically Signed   By: Richardean Sale M.D.   On: 09/13/2016 08:47    Assessment & Plan:   There are no diagnoses linked to this encounter. I am having [name] maintain her aspirin, albuterol, eletriptan, SAPHRIS,  DULoxetine, linaclotide, traMADol, HYDROcodone-acetaminophen, PROAIR HFA, budesonide-formoterol, pantoprazole, metoprolol tartrate, ranitidine, amphetamine-dextroamphetamine, cyclobenzaprine, zolpidem, meloxicam, nortriptyline, gabapentin, and ketoconazole.  No orders of the defined types were placed in this encounter.    Follow-up: No Follow-up on file.  Walker Kehr, MD

## 2017-02-19 NOTE — Patient Instructions (Signed)
  [name] , Thank you for taking time to come for your Medicare Wellness Visit. I appreciate your ongoing commitment to your health goals. Please review the following plan we discussed and let me know if I can assist you in the future.   These are the goals we discussed: Goals    None      This is a list of the screening recommended for you and due dates:  Health Maintenance  Topic Date Due  . HIV Screening  11/26/1987  . Pap Smear  07/23/2016  . Tetanus Vaccine  07/11/2022  . Flu Shot  Completed

## 2017-02-19 NOTE — Assessment & Plan Note (Signed)
Liver US

## 2017-02-20 ENCOUNTER — Encounter: Payer: Self-pay | Admitting: Internal Medicine

## 2017-02-20 NOTE — Telephone Encounter (Signed)
If okay, [facility] in [place]

## 2017-02-25 ENCOUNTER — Other Ambulatory Visit: Payer: Self-pay | Admitting: Internal Medicine

## 2017-02-25 DIAGNOSIS — M7062 Trochanteric bursitis, left hip: Secondary | ICD-10-CM | POA: Diagnosis not present

## 2017-02-25 DIAGNOSIS — M7061 Trochanteric bursitis, right hip: Secondary | ICD-10-CM | POA: Diagnosis not present

## 2017-02-25 DIAGNOSIS — Z5181 Encounter for therapeutic drug level monitoring: Secondary | ICD-10-CM | POA: Diagnosis not present

## 2017-02-25 DIAGNOSIS — G894 Chronic pain syndrome: Secondary | ICD-10-CM | POA: Diagnosis not present

## 2017-02-25 MED ORDER — METHYLPREDNISOLONE 4 MG PO TBPK: ORAL_TABLET | ORAL | 0 refills | Status: DC

## 2017-03-10 ENCOUNTER — Ambulatory Visit: Payer: Self-pay

## 2017-03-11 ENCOUNTER — Encounter: Payer: Self-pay | Admitting: Internal Medicine

## 2017-03-11 ENCOUNTER — Ambulatory Visit
Admission: RE | Admit: 2017-03-11 | Discharge: 2017-03-11 | Disposition: A | Discharge status: Home / Self Care | Payer: Medicare Other | Source: Ambulatory Visit | Attending: Internal Medicine | Admitting: Internal Medicine

## 2017-03-11 DIAGNOSIS — R748 Abnormal levels of other serum enzymes: Secondary | ICD-10-CM

## 2017-03-11 DIAGNOSIS — K760 Fatty (change of) liver, not elsewhere classified: Secondary | ICD-10-CM | POA: Diagnosis not present

## 2017-03-17 ENCOUNTER — Other Ambulatory Visit: Payer: Self-pay | Admitting: Internal Medicine

## 2017-03-28 ENCOUNTER — Other Ambulatory Visit (INDEPENDENT_AMBULATORY_CARE_PROVIDER_SITE_OTHER): Payer: Medicare Other

## 2017-03-28 DIAGNOSIS — D751 Secondary polycythemia: Secondary | ICD-10-CM

## 2017-03-28 LAB — CBC WITH DIFFERENTIAL/PLATELET
BASOS PCT: 1.1 % (ref 0.0–3.0)
Basophils Absolute: 0.1 10*3/uL (ref 0.0–0.1)
EOS PCT: 2.7 % (ref 0.0–5.0)
Eosinophils Absolute: 0.1 10*3/uL (ref 0.0–0.7)
HCT: 42.7 % (ref 36.0–46.0)
Hemoglobin: 14.4 g/dL (ref 12.0–15.0)
LYMPHS ABS: 2.1 10*3/uL (ref 0.7–4.0)
Lymphocytes Relative: 37.9 % (ref 12.0–46.0)
MCHC: 33.8 g/dL (ref 30.0–36.0)
MCV: 83.5 fl (ref 78.0–100.0)
Monocytes Absolute: 0.8 10*3/uL (ref 0.1–1.0)
Monocytes Relative: 15 % — ABNORMAL HIGH (ref 3.0–12.0)
NEUTROS ABS: 2.4 10*3/uL (ref 1.4–7.7)
NEUTROS PCT: 43.3 % (ref 43.0–77.0)
PLATELETS: 236 10*3/uL (ref 150.0–400.0)
RBC: 5.12 Mil/uL — ABNORMAL HIGH (ref 3.87–5.11)
RDW: 13.1 % (ref 11.5–15.5)
WBC: 5.6 10*3/uL (ref 4.0–10.5)

## 2017-04-07 DIAGNOSIS — M7062 Trochanteric bursitis, left hip: Secondary | ICD-10-CM | POA: Diagnosis not present

## 2017-04-07 DIAGNOSIS — Z5181 Encounter for therapeutic drug level monitoring: Secondary | ICD-10-CM | POA: Diagnosis not present

## 2017-04-07 DIAGNOSIS — G894 Chronic pain syndrome: Secondary | ICD-10-CM | POA: Diagnosis not present

## 2017-04-07 DIAGNOSIS — M7061 Trochanteric bursitis, right hip: Secondary | ICD-10-CM | POA: Diagnosis not present

## 2017-04-23 ENCOUNTER — Other Ambulatory Visit: Payer: Self-pay | Admitting: Internal Medicine

## 2017-04-23 DIAGNOSIS — Z1231 Encounter for screening mammogram for malignant neoplasm of breast: Secondary | ICD-10-CM

## 2017-05-05 ENCOUNTER — Other Ambulatory Visit (INDEPENDENT_AMBULATORY_CARE_PROVIDER_SITE_OTHER): Payer: Medicare Other

## 2017-05-05 DIAGNOSIS — D751 Secondary polycythemia: Secondary | ICD-10-CM

## 2017-05-05 DIAGNOSIS — M7061 Trochanteric bursitis, right hip: Secondary | ICD-10-CM | POA: Diagnosis not present

## 2017-05-05 DIAGNOSIS — Z5181 Encounter for therapeutic drug level monitoring: Secondary | ICD-10-CM | POA: Diagnosis not present

## 2017-05-05 DIAGNOSIS — R739 Hyperglycemia, unspecified: Secondary | ICD-10-CM

## 2017-05-05 DIAGNOSIS — G894 Chronic pain syndrome: Secondary | ICD-10-CM | POA: Diagnosis not present

## 2017-05-05 DIAGNOSIS — M7062 Trochanteric bursitis, left hip: Secondary | ICD-10-CM | POA: Diagnosis not present

## 2017-05-05 LAB — BASIC METABOLIC PANEL
BUN: 9 mg/dL (ref 6–23)
CO2: 24 mEq/L (ref 19–32)
Calcium: 8.9 mg/dL (ref 8.4–10.5)
Chloride: 101 mEq/L (ref 96–112)
Creatinine, Ser: 0.68 mg/dL (ref 0.40–1.20)
GFR: 99.7 mL/min (ref >60.00)
GLUCOSE: 107 mg/dL — AB (ref 70–99)
POTASSIUM: 4.4 meq/L (ref 3.5–5.1)
SODIUM: 135 meq/L (ref 135–145)

## 2017-05-05 LAB — CBC WITH DIFFERENTIAL/PLATELET
BASOS ABS: 0.1 10*3/uL (ref 0.0–0.1)
Basophils Relative: 0.6 % (ref 0.0–3.0)
EOS ABS: 0.2 10*3/uL (ref 0.0–0.7)
EOS PCT: 2.1 % (ref 0.0–5.0)
HEMATOCRIT: 40.4 % (ref 36.0–46.0)
Hemoglobin: 13.4 g/dL (ref 12.0–15.0)
Lymphocytes Relative: 23.3 % (ref 12.0–46.0)
Lymphs Abs: 2.2 10*3/uL (ref 0.7–4.0)
MCHC: 33.3 g/dL (ref 30.0–36.0)
MCV: 83.1 fl (ref 78.0–100.0)
MONOS PCT: 12.5 % — AB (ref 3.0–12.0)
Monocytes Absolute: 1.2 10*3/uL — ABNORMAL HIGH (ref 0.1–1.0)
NEUTROS ABS: 5.8 10*3/uL (ref 1.4–7.7)
Neutrophils Relative %: 61.5 % (ref 43.0–77.0)
Platelets: 255 10*3/uL (ref 150.0–400.0)
RBC: 4.86 Mil/uL (ref 3.87–5.11)
RDW: 14.8 % (ref 11.5–15.5)
WBC: 9.5 10*3/uL (ref 4.0–10.5)

## 2017-05-05 LAB — HEMOGLOBIN A1C: Hgb A1c MFr Bld: 6 % (ref 4.6–6.5)

## 2017-05-09 ENCOUNTER — Other Ambulatory Visit: Payer: Self-pay | Admitting: Internal Medicine

## 2017-05-20 ENCOUNTER — Ambulatory Visit
Admission: RE | Admit: 2017-05-20 | Discharge: 2017-05-20 | Disposition: A | Discharge status: Home / Self Care | Payer: Medicare Other | Source: Ambulatory Visit | Attending: Internal Medicine | Admitting: Internal Medicine

## 2017-05-20 DIAGNOSIS — Z1231 Encounter for screening mammogram for malignant neoplasm of breast: Secondary | ICD-10-CM

## 2017-05-26 DIAGNOSIS — Z79899 Other long term (current) drug therapy: Secondary | ICD-10-CM | POA: Diagnosis not present

## 2017-06-02 DIAGNOSIS — G894 Chronic pain syndrome: Secondary | ICD-10-CM | POA: Diagnosis not present

## 2017-06-02 DIAGNOSIS — M7062 Trochanteric bursitis, left hip: Secondary | ICD-10-CM | POA: Diagnosis not present

## 2017-06-02 DIAGNOSIS — Z5181 Encounter for therapeutic drug level monitoring: Secondary | ICD-10-CM | POA: Diagnosis not present

## 2017-06-02 DIAGNOSIS — M7061 Trochanteric bursitis, right hip: Secondary | ICD-10-CM | POA: Diagnosis not present

## 2017-06-30 DIAGNOSIS — M7061 Trochanteric bursitis, right hip: Secondary | ICD-10-CM | POA: Diagnosis not present

## 2017-06-30 DIAGNOSIS — G894 Chronic pain syndrome: Secondary | ICD-10-CM | POA: Diagnosis not present

## 2017-06-30 DIAGNOSIS — M7062 Trochanteric bursitis, left hip: Secondary | ICD-10-CM | POA: Diagnosis not present

## 2017-06-30 DIAGNOSIS — Z5181 Encounter for therapeutic drug level monitoring: Secondary | ICD-10-CM | POA: Diagnosis not present

## 2017-07-04 ENCOUNTER — Other Ambulatory Visit (INDEPENDENT_AMBULATORY_CARE_PROVIDER_SITE_OTHER): Payer: Medicare Other

## 2017-07-04 DIAGNOSIS — D751 Secondary polycythemia: Secondary | ICD-10-CM

## 2017-07-04 DIAGNOSIS — M222X1 Patellofemoral disorders, right knee: Secondary | ICD-10-CM | POA: Diagnosis not present

## 2017-07-04 DIAGNOSIS — M222X2 Patellofemoral disorders, left knee: Secondary | ICD-10-CM | POA: Diagnosis not present

## 2017-07-04 LAB — CBC WITH DIFFERENTIAL/PLATELET
BASOS PCT: 1.3 % (ref 0.0–3.0)
Basophils Absolute: 0.1 10*3/uL (ref 0.0–0.1)
EOS ABS: 0.2 10*3/uL (ref 0.0–0.7)
Eosinophils Relative: 3.3 % (ref 0.0–5.0)
HEMATOCRIT: 42.3 % (ref 36.0–46.0)
HEMOGLOBIN: 14.3 g/dL (ref 12.0–15.0)
LYMPHS PCT: 36.1 % (ref 12.0–46.0)
Lymphs Abs: 2.7 10*3/uL (ref 0.7–4.0)
MCHC: 33.9 g/dL (ref 30.0–36.0)
MCV: 82.9 fl (ref 78.0–100.0)
Monocytes Absolute: 0.8 10*3/uL (ref 0.1–1.0)
Monocytes Relative: 10.3 % (ref 3.0–12.0)
Neutro Abs: 3.6 10*3/uL (ref 1.4–7.7)
Neutrophils Relative %: 49 % (ref 43.0–77.0)
Platelets: 243 10*3/uL (ref 150.0–400.0)
RBC: 5.1 Mil/uL (ref 3.87–5.11)
RDW: 16.1 % — ABNORMAL HIGH (ref 11.5–15.5)
WBC: 7.4 10*3/uL (ref 4.0–10.5)

## 2017-07-28 DIAGNOSIS — G894 Chronic pain syndrome: Secondary | ICD-10-CM | POA: Diagnosis not present

## 2017-07-28 DIAGNOSIS — M7062 Trochanteric bursitis, left hip: Secondary | ICD-10-CM | POA: Diagnosis not present

## 2017-07-28 DIAGNOSIS — M7061 Trochanteric bursitis, right hip: Secondary | ICD-10-CM | POA: Diagnosis not present

## 2017-07-28 DIAGNOSIS — Z5181 Encounter for therapeutic drug level monitoring: Secondary | ICD-10-CM | POA: Diagnosis not present

## 2017-08-12 ENCOUNTER — Other Ambulatory Visit (INDEPENDENT_AMBULATORY_CARE_PROVIDER_SITE_OTHER): Payer: Medicare Other

## 2017-08-12 DIAGNOSIS — R739 Hyperglycemia, unspecified: Secondary | ICD-10-CM

## 2017-08-12 DIAGNOSIS — D751 Secondary polycythemia: Secondary | ICD-10-CM

## 2017-08-12 LAB — CBC WITH DIFFERENTIAL/PLATELET
BASOS PCT: 0.9 % (ref 0.0–3.0)
Basophils Absolute: 0.1 10*3/uL (ref 0.0–0.1)
EOS PCT: 2.2 % (ref 0.0–5.0)
Eosinophils Absolute: 0.2 10*3/uL (ref 0.0–0.7)
HEMATOCRIT: 46.2 % — AB (ref 36.0–46.0)
HEMOGLOBIN: 15.7 g/dL — AB (ref 12.0–15.0)
LYMPHS PCT: 28.2 % (ref 12.0–46.0)
Lymphs Abs: 2.2 10*3/uL (ref 0.7–4.0)
MCHC: 34 g/dL (ref 30.0–36.0)
MCV: 85.2 fl (ref 78.0–100.0)
Monocytes Absolute: 0.9 10*3/uL (ref 0.1–1.0)
Monocytes Relative: 12.4 % — ABNORMAL HIGH (ref 3.0–12.0)
Neutro Abs: 4.3 10*3/uL (ref 1.4–7.7)
Neutrophils Relative %: 56.3 % (ref 43.0–77.0)
Platelets: 239 10*3/uL (ref 150.0–400.0)
RBC: 5.43 Mil/uL — ABNORMAL HIGH (ref 3.87–5.11)
RDW: 15.3 % (ref 11.5–15.5)
WBC: 7.7 10*3/uL (ref 4.0–10.5)

## 2017-08-12 LAB — BASIC METABOLIC PANEL
BUN: 10 mg/dL (ref 6–23)
CO2: 26 meq/L (ref 19–32)
CREATININE: 0.65 mg/dL (ref 0.40–1.20)
Calcium: 9.4 mg/dL (ref 8.4–10.5)
Chloride: 101 mEq/L (ref 96–112)
GFR: 104.9 mL/min (ref >60.00)
GLUCOSE: 106 mg/dL — AB (ref 70–99)
Potassium: 4.4 mEq/L (ref 3.5–5.1)
SODIUM: 136 meq/L (ref 135–145)

## 2017-08-12 LAB — HEMOGLOBIN A1C: Hgb A1c MFr Bld: 5.9 % (ref 4.6–6.5)

## 2017-08-19 ENCOUNTER — Ambulatory Visit: Payer: Self-pay | Admitting: Internal Medicine

## 2017-08-25 ENCOUNTER — Ambulatory Visit (INDEPENDENT_AMBULATORY_CARE_PROVIDER_SITE_OTHER): Payer: Medicare Other | Admitting: Internal Medicine

## 2017-08-25 ENCOUNTER — Encounter: Payer: Self-pay | Admitting: Internal Medicine

## 2017-08-25 VITALS — BP 128/84 | HR 63 | Temp 98.4°F | Ht 66.0 in | Wt 199.0 lb

## 2017-08-25 DIAGNOSIS — M7061 Trochanteric bursitis, right hip: Secondary | ICD-10-CM | POA: Diagnosis not present

## 2017-08-25 DIAGNOSIS — Z5181 Encounter for therapeutic drug level monitoring: Secondary | ICD-10-CM | POA: Diagnosis not present

## 2017-08-25 DIAGNOSIS — F319 Bipolar disorder, unspecified: Secondary | ICD-10-CM | POA: Diagnosis not present

## 2017-08-25 DIAGNOSIS — I471 Supraventricular tachycardia: Secondary | ICD-10-CM | POA: Diagnosis not present

## 2017-08-25 DIAGNOSIS — R739 Hyperglycemia, unspecified: Secondary | ICD-10-CM | POA: Diagnosis not present

## 2017-08-25 DIAGNOSIS — G894 Chronic pain syndrome: Secondary | ICD-10-CM | POA: Diagnosis not present

## 2017-08-25 DIAGNOSIS — I10 Essential (primary) hypertension: Secondary | ICD-10-CM | POA: Diagnosis not present

## 2017-08-25 DIAGNOSIS — K5909 Other constipation: Secondary | ICD-10-CM

## 2017-08-25 DIAGNOSIS — D751 Secondary polycythemia: Secondary | ICD-10-CM

## 2017-08-25 DIAGNOSIS — M7062 Trochanteric bursitis, left hip: Secondary | ICD-10-CM | POA: Diagnosis not present

## 2017-08-25 MED ORDER — LINACLOTIDE 145 MCG PO CAPS
145.0000 ug | ORAL_CAPSULE | Freq: Every day | ORAL | 3 refills | Status: DC | PRN
Start: 2017-08-25 — End: 2018-11-03

## 2017-08-25 NOTE — Progress Notes (Signed)
Subjective:  Patient ID: [name], female    DOB: [birth year]  Age: 47 y.o. MRN: [id number]  CC: No chief complaint on file.   HPI [name] presents for palpitations and CP x 2-3/week in am hrs. Not severe. Drinks 4 diet cokes in am. Quit smoking 1 mo ago. F/u constipation, polycythemia  Outpatient Medications Prior to Visit  Medication Sig Dispense Refill  . albuterol (PROVENTIL) (2.5 MG/3ML) 0.083% nebulizer solution Take 3 mLs (2.5 mg total) by nebulization every 6 (six) hours as needed. For shortness of breath. 75 mL 0  . amphetamine-dextroamphetamine (ADDERALL) 10 MG tablet Take 10 mg by mouth daily.  0  . Asenapine Maleate (SAPHRIS) 10 MG SUBL Place under the tongue at bedtime.    [name] aspirin (BAYER ASPIRIN) 325 MG tablet Take 1 tablet (325 mg total) by mouth daily. 100 tablet 3  . budesonide-formoterol (SYMBICORT) 160-4.5 MCG/ACT inhaler INHALE 2 PUFFS BY MOUTH TWICE A DAY 10.2 Inhaler 5  . cyclobenzaprine (FLEXERIL) 10 MG tablet TAKE 1/2 - 1 TABLET BY MOUTH DAILY OR TWICE DAILY IF TOLERATED  0  . eletriptan (RELPAX) 40 MG tablet Take 1 tablet (40 mg total) by mouth as needed for migraine or headache. May repeat in 2 hours if headache persists or recurs. 15 tablet 6  . fluconazole (DIFLUCAN) 100 MG tablet Take 2 tabs on day#1, then 1 tab daily on Days #2-10 11 tablet 1  . gabapentin (NEURONTIN) 100 MG capsule TAKE 1 CAPSULE BY MOUTH 3  TIMES DAILY 270 capsule 1  . HYDROcodone-acetaminophen (NORCO/VICODIN) 5-325 MG tablet Take 1-2 tablets by mouth every 4 (four) hours as needed for moderate pain. (Patient taking differently: Take 1-2 tablets by mouth every 4 (four) hours as needed for moderate pain. 1/2-1 tablet every day PRN, Moderate pain) 30 tablet 0  . linaclotide (LINZESS) 145 MCG CAPS capsule Take 1-2 capsules (145-290 mcg total) by mouth daily as needed. (Patient taking differently: Take 145 mcg by mouth 2 (two) times daily. ) 60 capsule 11  . meloxicam (MOBIC) 15 MG  tablet TAKE 1 TABLET BY MOUTH  DAILY 90 tablet 1  . metoprolol tartrate (LOPRESSOR) 100 MG tablet TAKE 1 TABLET BY MOUTH TWO  TIMES DAILY 180 tablet 3  . nortriptyline (PAMELOR) 25 MG capsule TAKE 1 CAPSULE BY MOUTH AT  BEDTIME 90 capsule 1  . pantoprazole (PROTONIX) 40 MG tablet TAKE 1 TABLET BY MOUTH  DAILY 90 tablet 3  . PROAIR HFA 108 (90 Base) MCG/ACT inhaler INHALE 2 PUFFS BY MOUTH EVERY 4 HOURS AS NEEDED FOR SHORTNESS OF BREATH 8.5 Inhaler 2  . promethazine-codeine (PHENERGAN WITH CODEINE) 6.25-10 MG/5ML syrup Take 5 mLs by mouth every 4 (four) hours as needed. 300 mL 0  . ranitidine (ZANTAC) 300 MG tablet TAKE 1 TABLET BY MOUTH AT  BEDTIME 90 tablet 3  . traMADol (ULTRAM) 50 MG tablet Limit 1 tablet by mouth 4 - 8  times per day if tolerated 240 tablet 0  . zolpidem (AMBIEN) 5 MG tablet Take 5 mg by mouth daily.  1  . azithromycin (ZITHROMAX Z-PAK) 250 MG tablet As directed 6 each 0  . DULoxetine (CYMBALTA) 60 MG capsule Take 60 mg by mouth daily.     [name] ketoconazole (NIZORAL) 2 % cream Apply 1 application topically daily. 45 g 1  . methylPREDNISolone (MEDROL DOSEPAK) 4 MG TBPK tablet As directed 21 tablet 0   No facility-administered medications prior to visit.     ROS: Review  of Systems  Constitutional: Negative for activity change, appetite change, chills, fatigue and unexpected weight change.  HENT: Negative for congestion, mouth sores and sinus pressure.   Eyes: Negative for visual disturbance.  Respiratory: Negative for cough and chest tightness.   Cardiovascular: Positive for chest pain and palpitations.  Gastrointestinal: Negative for abdominal pain and nausea.  Genitourinary: Negative for difficulty urinating, frequency and vaginal pain.  Musculoskeletal: Negative for back pain and gait problem.  Skin: Negative for pallor and rash.  Neurological: Negative for dizziness, tremors, weakness, numbness and headaches.  Psychiatric/Behavioral: Negative for confusion and sleep  disturbance.    Objective:  BP 128/84 (BP Location: Left Arm, Patient Position: Sitting, Cuff Size: Large)   Pulse 63   Temp 98.4 F (36.9 C) (Oral)   Ht 5\' 6"  (1.676 m)   Wt 199 lb (90.3 kg)   SpO2 95%   BMI 32.12 kg/m   BP Readings from Last 3 Encounters:  08/25/17 128/84  02/19/17 116/72  01/30/17 122/76    Wt Readings from Last 3 Encounters:  08/25/17 199 lb (90.3 kg)  02/19/17 201 lb (91.2 kg)  01/30/17 202 lb (91.6 kg)    Physical Exam  Constitutional: She appears well-developed. No distress.  HENT:  Head: Normocephalic.  Right Ear: External ear normal.  Left Ear: External ear normal.  Nose: Nose normal.  Mouth/Throat: Oropharynx is clear and moist.  Eyes: Pupils are equal, round, and reactive to light. Conjunctivae are normal. Right eye exhibits no discharge. Left eye exhibits no discharge.  Neck: Normal range of motion. Neck supple. No JVD present. No tracheal deviation present. No thyromegaly present.  Cardiovascular: Normal rate, regular rhythm and normal heart sounds.  Pulmonary/Chest: No stridor. No respiratory distress. She has no wheezes.  Abdominal: Soft. Bowel sounds are normal. She exhibits no distension and no mass. There is no tenderness. There is no rebound and no guarding.  Musculoskeletal: She exhibits no edema or tenderness.  Lymphadenopathy:    She has no cervical adenopathy.  Neurological: She displays normal reflexes. No cranial nerve deficit. She exhibits normal muscle tone. Coordination normal.  Skin: No rash noted. No erythema.  Psychiatric: She has a normal mood and affect. Her behavior is normal. Judgment and thought content normal.  no tachycardia  Lab Results  Component Value Date   WBC 7.7 08/12/2017   HGB 15.7 (H) 08/12/2017   HCT 46.2 (H) 08/12/2017   PLT 239.0 08/12/2017   GLUCOSE 106 (H) 08/12/2017   CHOL 173 01/30/2017   TRIG 150.0 (H) 01/30/2017   HDL 32.80 (L) 01/30/2017   LDLDIRECT 161.1 02/18/2013   LDLCALC 110 (H)  01/30/2017   ALT 16 01/30/2017   AST 12 01/30/2017   NA 136 08/12/2017   K 4.4 08/12/2017   CL 101 08/12/2017   CREATININE 0.65 08/12/2017   BUN 10 08/12/2017   CO2 26 08/12/2017   TSH 2.24 01/30/2017   INR 1.05 08/29/2014   HGBA1C 5.9 08/12/2017    Mm Screening Breast Tomo Bilateral  Result Date: 05/20/2017 CLINICAL DATA:  Screening. EXAM: DIGITAL SCREENING BILATERAL MAMMOGRAM WITH TOMO AND CAD COMPARISON:  Previous exam(s). ACR Breast Density Category b: There are scattered areas of fibroglandular density. FINDINGS: There are no findings suspicious for malignancy. Images were processed with CAD. IMPRESSION: No mammographic evidence of malignancy. A result letter of this screening mammogram will be mailed directly to the patient. RECOMMENDATION: Screening mammogram in one year. (Code:SM-B-01Y) BI-RADS CATEGORY  1: Negative. Electronically Signed   By: Benjamine Mola  Owens Shark M.D.   On: 05/20/2017 13:53    Assessment & Plan:   There are no diagnoses linked to this encounter.   No orders of the defined types were placed in this encounter.    Follow-up: No follow-ups on file.  Walker Kehr, MD

## 2017-08-25 NOTE — Assessment & Plan Note (Signed)
Reduce caffeine

## 2017-08-25 NOTE — Assessment & Plan Note (Signed)
Doing fair 

## 2017-08-25 NOTE — Assessment & Plan Note (Signed)
BP Readings from Last 3 Encounters:  08/25/17 128/84  02/19/17 116/72  01/30/17 122/76

## 2017-08-25 NOTE — Assessment & Plan Note (Signed)
Phlebotomy prn for Hct>45%

## 2017-08-25 NOTE — Assessment & Plan Note (Signed)
Linzess 

## 2017-08-25 NOTE — Assessment & Plan Note (Signed)
Labs ok.

## 2017-09-22 DIAGNOSIS — G894 Chronic pain syndrome: Secondary | ICD-10-CM | POA: Diagnosis not present

## 2017-09-22 DIAGNOSIS — M7062 Trochanteric bursitis, left hip: Secondary | ICD-10-CM | POA: Diagnosis not present

## 2017-09-22 DIAGNOSIS — Z5181 Encounter for therapeutic drug level monitoring: Secondary | ICD-10-CM | POA: Diagnosis not present

## 2017-09-22 DIAGNOSIS — M7061 Trochanteric bursitis, right hip: Secondary | ICD-10-CM | POA: Diagnosis not present

## 2017-10-20 DIAGNOSIS — G894 Chronic pain syndrome: Secondary | ICD-10-CM | POA: Diagnosis not present

## 2017-10-20 DIAGNOSIS — M7062 Trochanteric bursitis, left hip: Secondary | ICD-10-CM | POA: Diagnosis not present

## 2017-10-20 DIAGNOSIS — M7061 Trochanteric bursitis, right hip: Secondary | ICD-10-CM | POA: Diagnosis not present

## 2017-10-20 DIAGNOSIS — Z5181 Encounter for therapeutic drug level monitoring: Secondary | ICD-10-CM | POA: Diagnosis not present

## 2017-11-03 ENCOUNTER — Ambulatory Visit (INDEPENDENT_AMBULATORY_CARE_PROVIDER_SITE_OTHER): Payer: Medicare Other | Admitting: Internal Medicine

## 2017-11-03 ENCOUNTER — Encounter: Payer: Self-pay | Admitting: Internal Medicine

## 2017-11-03 VITALS — BP 122/86 | HR 111 | Temp 98.0°F | Ht 66.0 in | Wt 197.0 lb

## 2017-11-03 DIAGNOSIS — J019 Acute sinusitis, unspecified: Secondary | ICD-10-CM | POA: Diagnosis not present

## 2017-11-03 DIAGNOSIS — J4520 Mild intermittent asthma, uncomplicated: Secondary | ICD-10-CM | POA: Diagnosis not present

## 2017-11-03 DIAGNOSIS — I10 Essential (primary) hypertension: Secondary | ICD-10-CM | POA: Diagnosis not present

## 2017-11-03 DIAGNOSIS — R739 Hyperglycemia, unspecified: Secondary | ICD-10-CM

## 2017-11-03 MED ORDER — HYDROCODONE-HOMATROPINE 5-1.5 MG/5ML PO SYRP: 5.0000 mL | ORAL_SOLUTION | Freq: Four times a day (QID) | ORAL | 0 refills | Status: AC | PRN

## 2017-11-03 MED ORDER — AZITHROMYCIN 250 MG PO TABS: ORAL_TABLET | ORAL | 1 refills | Status: DC

## 2017-11-03 NOTE — Patient Instructions (Signed)
Please take all new medication as prescribed - the antibiotic, and cough medicine as needed  Please continue all other medications as before, and refills have been done if requested.  Please have the pharmacy call with any other refills you may need.  Please keep your appointments with your specialists as you may have planned   

## 2017-11-03 NOTE — Assessment & Plan Note (Signed)
stable overall by history and exam, recent data reviewed with pt, and pt to continue medical treatment as before,  to f/u any worsening symptoms or concerns  

## 2017-11-03 NOTE — Assessment & Plan Note (Signed)
Mild to mod, for antibx course,  to f/u any worsening symptoms or concerns 

## 2017-11-03 NOTE — Progress Notes (Signed)
Subjective:    Patient ID: [name], female    DOB: 1970/07/26, 47 y.o.   MRN: [id number]  HPI   Here with 2-3 days acute onset fever, facial pain, pressure, headache, general weakness and malaise, and greenish d/c, with mild ST and cough, but pt denies chest pain, wheezing, increased sob or doe, orthopnea, PND, increased LE swelling, palpitations, dizziness or syncope.  Pt denies new neurological symptoms such as new headache, or facial or extremity weakness or numbness   Pt denies polydipsia, polyuria Past Medical History:  Diagnosis Date  . Anxiety state, unspecified   . Arthritis   . Asthma   . Attention deficit disorder without mention of hyperactivity   . Bipolar disorder ([place])   . Constipation    takes Linzess  . Depressive disorder, not elsewhere classified   . Esophageal reflux   . Hypertension   . Migraine without aura, without mention of intractable migraine without mention of status migrainosus   . Pain in joint, site unspecified   . Pneumonia   . Polycythemia    per pt  . Unspecified asthma(493.90)   . Unspecified essential hypertension   . Unspecified vitamin D deficiency    Past Surgical History:  Procedure Laterality Date  . ABDOMINAL HYSTERECTOMY    . abdominal ultrasound  10/12/97  . APPENDECTOMY  05/27/2013   gangrenous  . INCISE AND DRAIN ABCESS  2011   Right axilla- Dr. Barkley Bruns  . KNEE ARTHROSCOPY     both knees  . KNEE ARTHROSCOPY WITH FULKERSON SLIDE Right 10/17/2015   Procedure: KNEE ARTHROSCOPY WITH Elmarie Mainland;  Surgeon: Melrose Nakayama, MD;  Location: [facility];  Service: Orthopedics;  Laterality: Right;  . NM MYOCAR PERF WALL MOTION  11/2009   bruce myoview; perfusion defect in anterior region consistent with breast attenuation; remaining myocardium with normal perfusion; post-stress EF 74%; low risk scan   . ORIF TIBIA FRACTURE Right 11/24/2015   Procedure: OPEN REDUCTION INTERNAL FIXATION (ORIF) TIBIA FRACTURE;  Surgeon: Melrose Nakayama,  MD;  Location: [facility];  Service: Orthopedics;  Laterality: Right;  . PARTIAL HYSTERECTOMY  2007  . PLANTAR FASCIA SURGERY  2000  . TONSILLECTOMY  04/29/00  . TONSILLECTOMY Bilateral   . TRANSTHORACIC ECHOCARDIOGRAM  11/2009   EF=>55%; trace MR & TR    reports that she has been smoking cigarettes. She started smoking about 31 years ago. She has a 27.00 pack-year smoking history. She has never used smokeless tobacco. She reports that she does not drink alcohol or use drugs. family history includes ADD / ADHD in her child; Alcohol abuse in her brother and sister; Asthma in her child and unknown relative; Bipolar disorder in her sister; Breast cancer in her mother; COPD in her paternal grandmother; Cancer (age of onset: 61) in her mother; Coronary artery disease in her unknown relative; Depression in her mother; Diabetes in her paternal grandfather and unknown relative; Heart disease in her father, maternal grandfather, and paternal grandfather; Lupus in her sister; Multiple sclerosis in her mother. Allergies  Allergen Reactions  . Lisinopril Cough  . Nizoral [Ketoconazole]     Felt drunk w/tablets  . Amoxicillin Itching    REACTION: QUESTIONABLE  . Ancef [Cefazolin] Itching  . Doxycycline Nausea And Vomiting  . Hydrochlorothiazide Other (See Comments)    CRAMPS AT HIGHER DOSAGES  . Penicillins Itching    Has patient had a PCN reaction causing immediate rash, facial/tongue/throat swelling, SOB or lightheadedness with hypotension: No Has patient had a PCN  reaction causing severe rash involving mucus membranes or skin necrosis: No Has patient had a PCN reaction that required hospitalization No Has patient had a PCN reaction occurring within the last 10 years: No If all of the above answers are "NO", then may proceed with Cephalosporin use.   [name] Percocet [Oxycodone-Acetaminophen] Itching  . Talwin [Pentazocine] Nausea And Vomiting   Review of Systems  Constitutional: Negative for other unusual  diaphoresis or sweats HENT: Negative for ear discharge or swelling Eyes: Negative for other worsening visual disturbances Respiratory: Negative for stridor or other swelling  Gastrointestinal: Negative for worsening distension or other blood Genitourinary: Negative for retention or other urinary change Musculoskeletal: Negative for other MSK pain or swelling Skin: Negative for color change or other new lesions Neurological: Negative for worsening tremors and other numbness  Psychiatric/Behavioral: Negative for worsening agitation or other fatigue All other system neg per pt    Objective:   Physical Exam BP 122/86   Pulse (!) 111   Temp 98 F (36.7 C) (Oral)   Ht 5\' 6"  (1.676 m)   Wt 197 lb (89.4 kg)   SpO2 97%   BMI 31.80 kg/m  VS noted, mild ill Constitutional: Pt appears in NAD HENT: Head: NCAT.  Right Ear: External ear normal.  Left Ear: External ear normal.  Bilat tm's with mild erythema.  Max sinus areas mod tender bilaterally.  Pharynx with mild erythema, no exudate Eyes: . Pupils are equal, round, and reactive to light. Conjunctivae and EOM are normal Nose: without d/c or deformity Neck: Neck supple. Gross normal ROM Cardiovascular: Normal rate and regular rhythm.   Pulmonary/Chest: Effort normal and breath sounds without rales or wheezing.  Neurological: Pt is alert. At baseline orientation, motor grossly intact Skin: Skin is warm. No rashes, other new lesions, no LE edema Psychiatric: Pt behavior is normal without agitation  No other exam findings Lab Results  Component Value Date   WBC 7.7 08/12/2017   HGB 15.7 (H) 08/12/2017   HCT 46.2 (H) 08/12/2017   PLT 239.0 08/12/2017   GLUCOSE 106 (H) 08/12/2017   CHOL 173 01/30/2017   TRIG 150.0 (H) 01/30/2017   HDL 32.80 (L) 01/30/2017   LDLDIRECT 161.1 02/18/2013   LDLCALC 110 (H) 01/30/2017   ALT 16 01/30/2017   AST 12 01/30/2017   NA 136 08/12/2017   K 4.4 08/12/2017   CL 101 08/12/2017   CREATININE 0.65  08/12/2017   BUN 10 08/12/2017   CO2 26 08/12/2017   TSH 2.24 01/30/2017   INR 1.05 08/29/2014   HGBA1C 5.9 08/12/2017          Assessment & Plan:

## 2017-11-17 DIAGNOSIS — M7062 Trochanteric bursitis, left hip: Secondary | ICD-10-CM | POA: Diagnosis not present

## 2017-11-17 DIAGNOSIS — M7061 Trochanteric bursitis, right hip: Secondary | ICD-10-CM | POA: Diagnosis not present

## 2017-11-17 DIAGNOSIS — G894 Chronic pain syndrome: Secondary | ICD-10-CM | POA: Diagnosis not present

## 2017-11-17 DIAGNOSIS — Z5181 Encounter for therapeutic drug level monitoring: Secondary | ICD-10-CM | POA: Diagnosis not present

## 2017-11-30 IMAGING — RF DG C-ARM 61-120 MIN
1 series · 2 of 2 positions shown · non-contrast
Comparison: None.

CLINICAL DATA: OPEN REDUCTION INTERNAL FIXATION (ORIF) TIBIA
FRACTURE (Right) Radiation safety time out done by JBM LMP verified
by JBM Fluoro time 10 seconds

EXAM:
DG C-ARM 61-120 MIN

[Series 1: run · 2 of 2 slices shown]
[im 1/2]
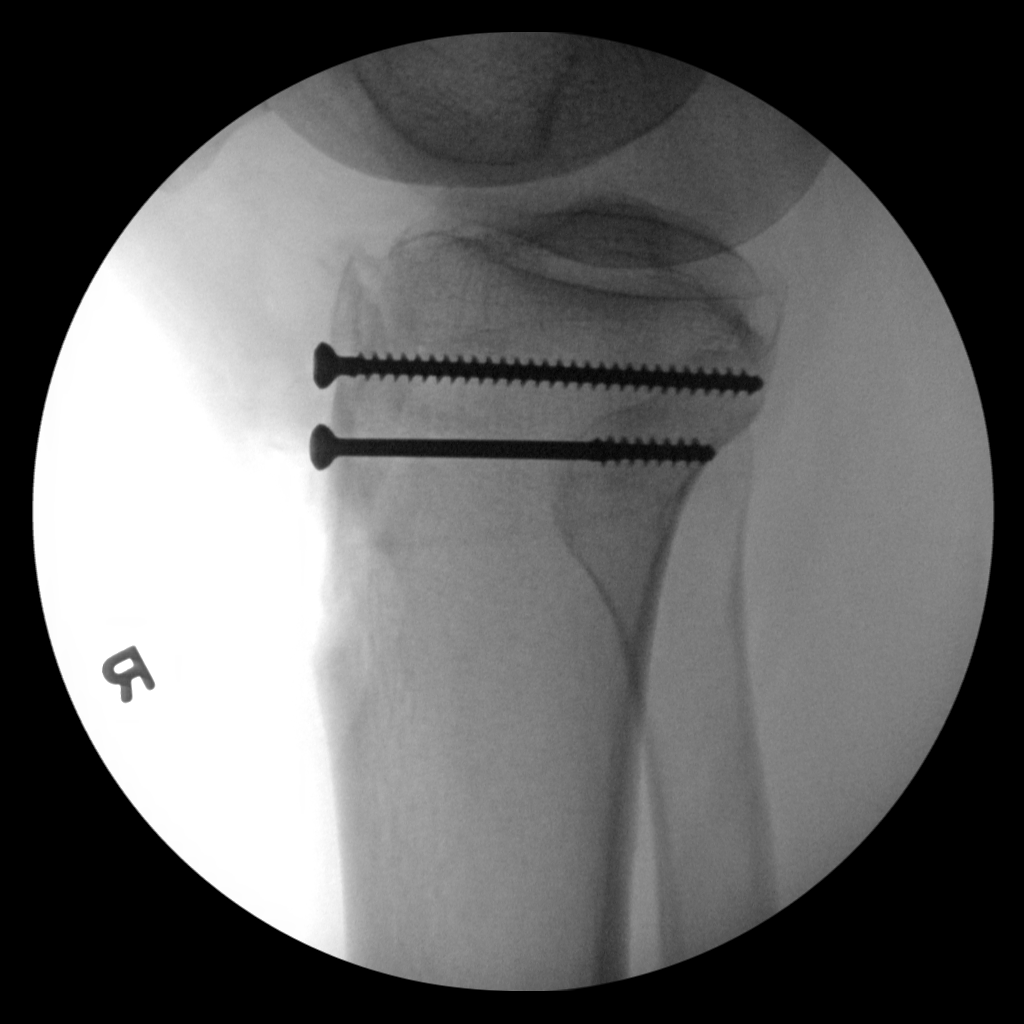
[im 2/2]
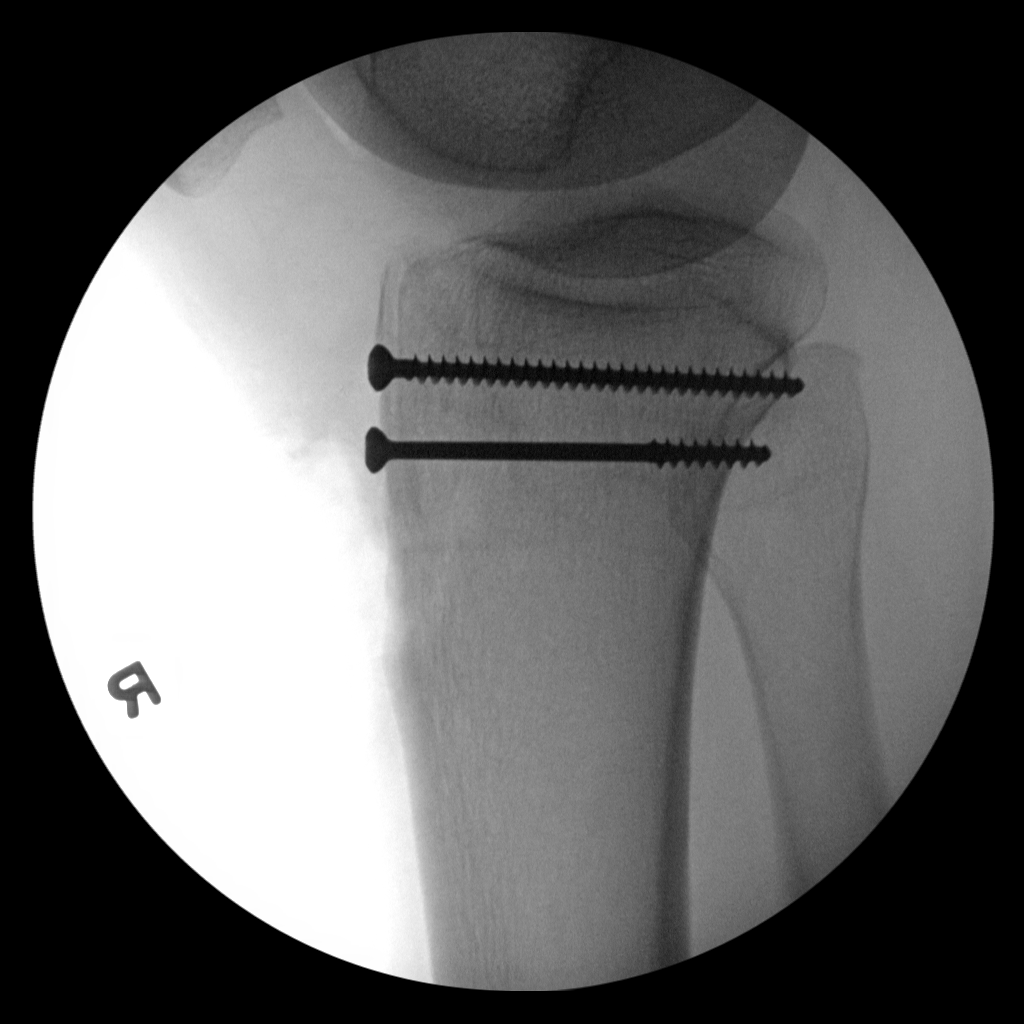

[2 of 2 positions shown; findings below may reference images not displayed]

FINDINGS: Two Cortical screws span the tibial plateau.
IMPRESSION: Internal fixation of tibial plateau fracture.

## 2017-12-16 DIAGNOSIS — Z79899 Other long term (current) drug therapy: Secondary | ICD-10-CM | POA: Diagnosis not present

## 2017-12-17 ENCOUNTER — Other Ambulatory Visit: Payer: Self-pay | Admitting: Internal Medicine

## 2017-12-19 ENCOUNTER — Other Ambulatory Visit (INDEPENDENT_AMBULATORY_CARE_PROVIDER_SITE_OTHER): Payer: Medicare Other

## 2017-12-19 DIAGNOSIS — D751 Secondary polycythemia: Secondary | ICD-10-CM

## 2017-12-19 LAB — CBC WITH DIFFERENTIAL/PLATELET
BASOS PCT: 0.8 % (ref 0.0–3.0)
Basophils Absolute: 0.1 10*3/uL (ref 0.0–0.1)
EOS PCT: 3 % (ref 0.0–5.0)
Eosinophils Absolute: 0.3 10*3/uL (ref 0.0–0.7)
HCT: 42 % (ref 36.0–46.0)
Hemoglobin: 14.4 g/dL (ref 12.0–15.0)
LYMPHS ABS: 2.5 10*3/uL (ref 0.7–4.0)
Lymphocytes Relative: 30.6 % (ref 12.0–46.0)
MCHC: 34.2 g/dL (ref 30.0–36.0)
MCV: 83.9 fl (ref 78.0–100.0)
MONO ABS: 1 10*3/uL (ref 0.1–1.0)
MONOS PCT: 12.4 % — AB (ref 3.0–12.0)
NEUTROS ABS: 4.4 10*3/uL (ref 1.4–7.7)
NEUTROS PCT: 53.2 % (ref 43.0–77.0)
Platelets: 216 10*3/uL (ref 150.0–400.0)
RBC: 5.01 Mil/uL (ref 3.87–5.11)
RDW: 14.2 % (ref 11.5–15.5)
WBC: 8.3 10*3/uL (ref 4.0–10.5)

## 2017-12-30 ENCOUNTER — Other Ambulatory Visit: Payer: Self-pay | Admitting: Internal Medicine

## 2017-12-31 DIAGNOSIS — M7062 Trochanteric bursitis, left hip: Secondary | ICD-10-CM | POA: Diagnosis not present

## 2017-12-31 DIAGNOSIS — M7061 Trochanteric bursitis, right hip: Secondary | ICD-10-CM | POA: Diagnosis not present

## 2017-12-31 DIAGNOSIS — Z5181 Encounter for therapeutic drug level monitoring: Secondary | ICD-10-CM | POA: Diagnosis not present

## 2017-12-31 DIAGNOSIS — G894 Chronic pain syndrome: Secondary | ICD-10-CM | POA: Diagnosis not present

## 2018-01-13 DIAGNOSIS — G894 Chronic pain syndrome: Secondary | ICD-10-CM | POA: Diagnosis not present

## 2018-01-13 DIAGNOSIS — M7062 Trochanteric bursitis, left hip: Secondary | ICD-10-CM | POA: Diagnosis not present

## 2018-01-13 DIAGNOSIS — M7061 Trochanteric bursitis, right hip: Secondary | ICD-10-CM | POA: Diagnosis not present

## 2018-01-13 DIAGNOSIS — Z5181 Encounter for therapeutic drug level monitoring: Secondary | ICD-10-CM | POA: Diagnosis not present

## 2018-01-15 DIAGNOSIS — M7062 Trochanteric bursitis, left hip: Secondary | ICD-10-CM | POA: Diagnosis not present

## 2018-01-15 DIAGNOSIS — G894 Chronic pain syndrome: Secondary | ICD-10-CM | POA: Diagnosis not present

## 2018-01-15 DIAGNOSIS — Z5181 Encounter for therapeutic drug level monitoring: Secondary | ICD-10-CM | POA: Diagnosis not present

## 2018-01-15 DIAGNOSIS — M7061 Trochanteric bursitis, right hip: Secondary | ICD-10-CM | POA: Diagnosis not present

## 2018-02-17 DIAGNOSIS — M7061 Trochanteric bursitis, right hip: Secondary | ICD-10-CM | POA: Diagnosis not present

## 2018-02-17 DIAGNOSIS — Z5181 Encounter for therapeutic drug level monitoring: Secondary | ICD-10-CM | POA: Diagnosis not present

## 2018-02-17 DIAGNOSIS — G894 Chronic pain syndrome: Secondary | ICD-10-CM | POA: Diagnosis not present

## 2018-02-17 DIAGNOSIS — M7062 Trochanteric bursitis, left hip: Secondary | ICD-10-CM | POA: Diagnosis not present

## 2018-02-26 ENCOUNTER — Other Ambulatory Visit (INDEPENDENT_AMBULATORY_CARE_PROVIDER_SITE_OTHER): Payer: Medicare Other

## 2018-02-26 ENCOUNTER — Encounter: Payer: Self-pay | Admitting: Internal Medicine

## 2018-02-26 ENCOUNTER — Ambulatory Visit (INDEPENDENT_AMBULATORY_CARE_PROVIDER_SITE_OTHER): Payer: Medicare Other | Admitting: Internal Medicine

## 2018-02-26 DIAGNOSIS — I10 Essential (primary) hypertension: Secondary | ICD-10-CM

## 2018-02-26 DIAGNOSIS — R209 Unspecified disturbances of skin sensation: Secondary | ICD-10-CM | POA: Diagnosis not present

## 2018-02-26 DIAGNOSIS — E559 Vitamin D deficiency, unspecified: Secondary | ICD-10-CM

## 2018-02-26 DIAGNOSIS — G43011 Migraine without aura, intractable, with status migrainosus: Secondary | ICD-10-CM | POA: Diagnosis not present

## 2018-02-26 DIAGNOSIS — D751 Secondary polycythemia: Secondary | ICD-10-CM

## 2018-02-26 DIAGNOSIS — R739 Hyperglycemia, unspecified: Secondary | ICD-10-CM

## 2018-02-26 LAB — LIPID PANEL
Cholesterol: 163 mg/dL (ref 0–200)
HDL: 26.7 mg/dL — ABNORMAL LOW (ref >39.00)
NonHDL: 136.14
Total CHOL/HDL Ratio: 6
Triglycerides: 209 mg/dL — ABNORMAL HIGH (ref 0.0–149.0)
VLDL: 41.8 mg/dL — ABNORMAL HIGH (ref 0.0–40.0)

## 2018-02-26 LAB — URINALYSIS
Bilirubin Urine: NEGATIVE
HGB URINE DIPSTICK: NEGATIVE
Ketones, ur: NEGATIVE
Leukocytes, UA: NEGATIVE
Nitrite: NEGATIVE
Specific Gravity, Urine: 1.01 (ref 1.000–1.030)
Total Protein, Urine: NEGATIVE
Urine Glucose: NEGATIVE
Urobilinogen, UA: 0.2 (ref 0.0–1.0)
pH: 5.5 (ref 5.0–8.0)

## 2018-02-26 LAB — HEPATIC FUNCTION PANEL
ALT: 16 U/L (ref 0–35)
AST: 12 U/L (ref 0–37)
Albumin: 4.6 g/dL (ref 3.5–5.2)
Alkaline Phosphatase: 118 U/L — ABNORMAL HIGH (ref 39–117)
Bilirubin, Direct: 0.1 mg/dL (ref 0.0–0.3)
Total Bilirubin: 0.4 mg/dL (ref 0.2–1.2)
Total Protein: 7.6 g/dL (ref 6.0–8.3)

## 2018-02-26 LAB — CBC WITH DIFFERENTIAL/PLATELET
Basophils Absolute: 0.1 10*3/uL (ref 0.0–0.1)
Basophils Relative: 0.7 % (ref 0.0–3.0)
EOS PCT: 2 % (ref 0.0–5.0)
Eosinophils Absolute: 0.1 10*3/uL (ref 0.0–0.7)
HCT: 43.3 % (ref 36.0–46.0)
Hemoglobin: 14.7 g/dL (ref 12.0–15.0)
Lymphocytes Relative: 26.8 % (ref 12.0–46.0)
Lymphs Abs: 2 10*3/uL (ref 0.7–4.0)
MCHC: 33.9 g/dL (ref 30.0–36.0)
MCV: 86.3 fl (ref 78.0–100.0)
Monocytes Absolute: 0.9 10*3/uL (ref 0.1–1.0)
Monocytes Relative: 12 % (ref 3.0–12.0)
Neutro Abs: 4.4 10*3/uL (ref 1.4–7.7)
Neutrophils Relative %: 58.5 % (ref 43.0–77.0)
Platelets: 250 10*3/uL (ref 150.0–400.0)
RBC: 5.02 Mil/uL (ref 3.87–5.11)
RDW: 13.5 % (ref 11.5–15.5)
WBC: 7.5 10*3/uL (ref 4.0–10.5)

## 2018-02-26 LAB — BASIC METABOLIC PANEL
BUN: 10 mg/dL (ref 6–23)
CO2: 26 mEq/L (ref 19–32)
Calcium: 9.8 mg/dL (ref 8.4–10.5)
Chloride: 102 mEq/L (ref 96–112)
Creatinine, Ser: 0.66 mg/dL (ref 0.40–1.20)
GFR: 96.74 mL/min (ref >60.00)
Glucose, Bld: 95 mg/dL (ref 70–99)
Potassium: 4.4 mEq/L (ref 3.5–5.1)
Sodium: 138 mEq/L (ref 135–145)

## 2018-02-26 LAB — LDL CHOLESTEROL, DIRECT: Direct LDL: 121 mg/dL

## 2018-02-26 LAB — HEMOGLOBIN A1C: Hgb A1c MFr Bld: 5.8 % (ref 4.6–6.5)

## 2018-02-26 LAB — TSH: TSH: 1.5 u[IU]/mL (ref 0.35–4.50)

## 2018-02-26 LAB — VITAMIN B12: VITAMIN B 12: 861 pg/mL (ref 211–911)

## 2018-02-26 NOTE — Assessment & Plan Note (Signed)
Relpax

## 2018-02-26 NOTE — Assessment & Plan Note (Signed)
Metoprolol 

## 2018-02-26 NOTE — Assessment & Plan Note (Signed)
CBC

## 2018-02-26 NOTE — Assessment & Plan Note (Signed)
B 12

## 2018-02-26 NOTE — Assessment & Plan Note (Signed)
A1c

## 2018-02-26 NOTE — Progress Notes (Signed)
Subjective:  Patient ID: [name], female    DOB: 1970/06/26  Age: 48 y.o. MRN: [id number]  CC: No chief complaint on file.   HPI [name] presents for HAs, asthma, HTN Not smoking, just vaping    Outpatient Medications Prior to Visit  Medication Sig Dispense Refill  . albuterol (PROVENTIL) (2.5 MG/3ML) 0.083% nebulizer solution Take 3 mLs (2.5 mg total) by nebulization every 6 (six) hours as needed. For shortness of breath. 75 mL 0  . amphetamine-dextroamphetamine (ADDERALL) 10 MG tablet Take 10 mg by mouth daily.  0  . Asenapine Maleate (SAPHRIS) 10 MG SUBL Place under the tongue at bedtime.    [name] aspirin (BAYER ASPIRIN) 325 MG tablet Take 1 tablet (325 mg total) by mouth daily. 100 tablet 3  . azithromycin (ZITHROMAX Z-PAK) 250 MG tablet 2 tab by mouth day 1, then 1 per day 6 tablet 1  . budesonide-formoterol (SYMBICORT) 160-4.5 MCG/ACT inhaler INHALE 2 PUFFS BY MOUTH TWICE A DAY 10.2 Inhaler 5  . cyclobenzaprine (FLEXERIL) 10 MG tablet TAKE 1/2 - 1 TABLET BY MOUTH DAILY OR TWICE DAILY IF TOLERATED  0  . eletriptan (RELPAX) 40 MG tablet Take 1 tablet (40 mg total) by mouth as needed for migraine or headache. May repeat in 2 hours if headache persists or recurs. 15 tablet 6  . gabapentin (NEURONTIN) 100 MG capsule TAKE 1 CAPSULE BY MOUTH 3  TIMES DAILY 270 capsule 0  . HYDROcodone-acetaminophen (NORCO/VICODIN) 5-325 MG tablet Take 1-2 tablets by mouth every 4 (four) hours as needed for moderate pain. (Patient taking differently: Take 1-2 tablets by mouth every 4 (four) hours as needed for moderate pain. 1/2-1 tablet every day PRN, Moderate pain) 30 tablet 0  . linaclotide (LINZESS) 145 MCG CAPS capsule Take 1-2 capsules (145-290 mcg total) by mouth daily as needed. 180 capsule 3  . meloxicam (MOBIC) 15 MG tablet TAKE 1 TABLET BY MOUTH  DAILY 90 tablet 0  . metoprolol tartrate (LOPRESSOR) 100 MG tablet TAKE 1 TABLET BY MOUTH TWO  TIMES DAILY 180 tablet 3  . nortriptyline  (PAMELOR) 25 MG capsule TAKE 1 CAPSULE BY MOUTH AT  BEDTIME 90 capsule 0  . pantoprazole (PROTONIX) 40 MG tablet TAKE 1 TABLET BY MOUTH  DAILY 90 tablet 3  . PROAIR HFA 108 (90 Base) MCG/ACT inhaler INHALE 2 PUFFS BY MOUTH EVERY 4 HOURS AS NEEDED FOR SHORTNESS OF BREATH 8.5 Inhaler 2  . ranitidine (ZANTAC) 300 MG tablet TAKE 1 TABLET BY MOUTH AT  BEDTIME 90 tablet 3  . traMADol (ULTRAM) 50 MG tablet Limit 1 tablet by mouth 4 - 8  times per day if tolerated 240 tablet 0  . zolpidem (AMBIEN) 5 MG tablet Take 5 mg by mouth daily.  1   No facility-administered medications prior to visit.     ROS: Review of Systems  Objective:  BP 122/76 (BP Location: Left Arm, Patient Position: Sitting, Cuff Size: Large)   Pulse 79   Temp 98.1 F (36.7 C) (Oral)   Ht 5\' 6"  (1.676 m)   Wt 207 lb (93.9 kg)   SpO2 97%   BMI 33.41 kg/m   BP Readings from Last 3 Encounters:  02/26/18 122/76  11/03/17 122/86  08/25/17 128/84    Wt Readings from Last 3 Encounters:  02/26/18 207 lb (93.9 kg)  11/03/17 197 lb (89.4 kg)  08/25/17 199 lb (90.3 kg)    Physical Exam  Lab Results  Component Value Date  WBC 8.3 12/19/2017   HGB 14.4 12/19/2017   HCT 42.0 12/19/2017   PLT 216.0 12/19/2017   GLUCOSE 106 (H) 08/12/2017   CHOL 173 01/30/2017   TRIG 150.0 (H) 01/30/2017   HDL 32.80 (L) 01/30/2017   LDLDIRECT 161.1 02/18/2013   LDLCALC 110 (H) 01/30/2017   ALT 16 01/30/2017   AST 12 01/30/2017   NA 136 08/12/2017   K 4.4 08/12/2017   CL 101 08/12/2017   CREATININE 0.65 08/12/2017   BUN 10 08/12/2017   CO2 26 08/12/2017   TSH 2.24 01/30/2017   INR 1.05 08/29/2014   HGBA1C 5.9 08/12/2017    Mm Screening Breast Tomo Bilateral  Result Date: 05/20/2017 CLINICAL DATA:  Screening. EXAM: DIGITAL SCREENING BILATERAL MAMMOGRAM WITH TOMO AND CAD COMPARISON:  Previous exam(s). ACR Breast Density Category b: There are scattered areas of fibroglandular density. FINDINGS: There are no findings suspicious  for malignancy. Images were processed with CAD. IMPRESSION: No mammographic evidence of malignancy. A result letter of this screening mammogram will be mailed directly to the patient. RECOMMENDATION: Screening mammogram in one year. (Code:SM-B-01Y) BI-RADS CATEGORY  1: Negative. Electronically Signed   By: Nolon Nations M.D.   On: 05/20/2017 13:53    Assessment & Plan:   There are no diagnoses linked to this encounter.   No orders of the defined types were placed in this encounter.    Follow-up: No follow-ups on file.  Walker Kehr, MD

## 2018-02-26 NOTE — Assessment & Plan Note (Signed)
Vit D 

## 2018-03-05 ENCOUNTER — Ambulatory Visit (INDEPENDENT_AMBULATORY_CARE_PROVIDER_SITE_OTHER): Payer: Medicare Other

## 2018-03-05 DIAGNOSIS — Z23 Encounter for immunization: Secondary | ICD-10-CM

## 2018-03-05 DIAGNOSIS — Z299 Encounter for prophylactic measures, unspecified: Secondary | ICD-10-CM

## 2018-03-17 ENCOUNTER — Other Ambulatory Visit: Payer: Self-pay | Admitting: Internal Medicine

## 2018-03-17 DIAGNOSIS — M4807 Spinal stenosis, lumbosacral region: Secondary | ICD-10-CM | POA: Diagnosis not present

## 2018-03-17 DIAGNOSIS — M5136 Other intervertebral disc degeneration, lumbar region: Secondary | ICD-10-CM | POA: Diagnosis not present

## 2018-03-17 DIAGNOSIS — M47897 Other spondylosis, lumbosacral region: Secondary | ICD-10-CM | POA: Diagnosis not present

## 2018-03-17 DIAGNOSIS — G580 Intercostal neuropathy: Secondary | ICD-10-CM | POA: Diagnosis not present

## 2018-03-17 DIAGNOSIS — M48062 Spinal stenosis, lumbar region with neurogenic claudication: Secondary | ICD-10-CM | POA: Diagnosis not present

## 2018-03-17 DIAGNOSIS — G894 Chronic pain syndrome: Secondary | ICD-10-CM | POA: Diagnosis not present

## 2018-03-17 DIAGNOSIS — M792 Neuralgia and neuritis, unspecified: Secondary | ICD-10-CM | POA: Diagnosis not present

## 2018-03-17 DIAGNOSIS — M545 Low back pain: Secondary | ICD-10-CM | POA: Diagnosis not present

## 2018-03-17 DIAGNOSIS — G8929 Other chronic pain: Secondary | ICD-10-CM | POA: Diagnosis not present

## 2018-04-14 DIAGNOSIS — G894 Chronic pain syndrome: Secondary | ICD-10-CM | POA: Diagnosis not present

## 2018-04-14 DIAGNOSIS — Z5181 Encounter for therapeutic drug level monitoring: Secondary | ICD-10-CM | POA: Diagnosis not present

## 2018-04-14 DIAGNOSIS — M7061 Trochanteric bursitis, right hip: Secondary | ICD-10-CM | POA: Diagnosis not present

## 2018-04-14 DIAGNOSIS — M7062 Trochanteric bursitis, left hip: Secondary | ICD-10-CM | POA: Diagnosis not present

## 2018-04-15 DIAGNOSIS — M222X1 Patellofemoral disorders, right knee: Secondary | ICD-10-CM | POA: Diagnosis not present

## 2018-04-15 DIAGNOSIS — M25562 Pain in left knee: Secondary | ICD-10-CM | POA: Diagnosis not present

## 2018-04-20 ENCOUNTER — Other Ambulatory Visit: Payer: Self-pay

## 2018-04-21 MED ORDER — MELOXICAM 15 MG PO TABS: 15.0000 mg | ORAL_TABLET | Freq: Every day | ORAL | 0 refills | Status: DC

## 2018-04-21 MED ORDER — NORTRIPTYLINE HCL 25 MG PO CAPS: 25.0000 mg | ORAL_CAPSULE | Freq: Every day | ORAL | 0 refills | Status: DC

## 2018-04-21 MED ORDER — METOPROLOL TARTRATE 100 MG PO TABS: 100.0000 mg | ORAL_TABLET | Freq: Two times a day (BID) | ORAL | 3 refills | Status: DC

## 2018-04-21 MED ORDER — PANTOPRAZOLE SODIUM 40 MG PO TBEC: 40.0000 mg | DELAYED_RELEASE_TABLET | Freq: Every day | ORAL | 3 refills | Status: DC

## 2018-04-21 MED ORDER — GABAPENTIN 100 MG PO CAPS: 100.0000 mg | ORAL_CAPSULE | Freq: Three times a day (TID) | ORAL | 0 refills | Status: DC

## 2018-04-21 MED ORDER — RANITIDINE HCL 300 MG PO TABS: 300.0000 mg | ORAL_TABLET | Freq: Every day | ORAL | 3 refills | Status: DC

## 2018-05-05 ENCOUNTER — Encounter: Payer: Self-pay | Admitting: Internal Medicine

## 2018-05-05 ENCOUNTER — Ambulatory Visit (INDEPENDENT_AMBULATORY_CARE_PROVIDER_SITE_OTHER): Payer: Medicare Other | Admitting: Internal Medicine

## 2018-05-05 DIAGNOSIS — B351 Tinea unguium: Secondary | ICD-10-CM | POA: Diagnosis not present

## 2018-05-05 DIAGNOSIS — I471 Supraventricular tachycardia: Secondary | ICD-10-CM

## 2018-05-05 DIAGNOSIS — I10 Essential (primary) hypertension: Secondary | ICD-10-CM | POA: Diagnosis not present

## 2018-05-05 DIAGNOSIS — G43011 Migraine without aura, intractable, with status migrainosus: Secondary | ICD-10-CM

## 2018-05-05 DIAGNOSIS — J4520 Mild intermittent asthma, uncomplicated: Secondary | ICD-10-CM | POA: Diagnosis not present

## 2018-05-05 MED ORDER — ALBUTEROL SULFATE HFA 108 (90 BASE) MCG/ACT IN AERS: 1.0000 | INHALATION_SPRAY | RESPIRATORY_TRACT | 5 refills | Status: DC | PRN

## 2018-05-05 MED ORDER — CICLOPIROX 8 % EX SOLN: Freq: Every day | CUTANEOUS | 0 refills | Status: DC

## 2018-05-05 NOTE — Assessment & Plan Note (Signed)
Metoprolol 

## 2018-05-05 NOTE — Assessment & Plan Note (Signed)
Relpax as needed

## 2018-05-05 NOTE — Assessment & Plan Note (Signed)
Discussed.  And let prescription emailed

## 2018-05-05 NOTE — Progress Notes (Signed)
Virtual Visit via Telephone Note  I connected with [name] on 05/05/18 at  9:30 AM EDT by telephone and verified that I am speaking with the correct person using two identifiers.   I discussed the limitations, risks, security and privacy concerns of performing an evaluation and management service by telephone and the availability of in person appointments. I also discussed with the patient that there may be a patient responsible charge related to this service. The patient expressed understanding and agreed to proceed.   History of Present Illness: [name] is complaining of both big toenails affected by a fungal infection.  Her right big toenail just came off partially.  No pain.  Follow-up asthma, Migraine headaches, hypertension   Observations/Objective:  [name] is in no acute distress.  She looks well.  Her right big toenail is partially loss.  The rest is discolored and thickened.  Her left big toenail is discolored and thick to Assessment and Plan: COVID-19 prevention discussed See plan  Follow Up Instructions:    I discussed the assessment and treatment plan with the patient. The patient was provided an opportunity to ask questions and all were answered. The patient agreed with the plan and demonstrated an understanding of the instructions.   The patient was advised to call back or seek an in-person evaluation if the symptoms worsen or if the condition fails to improve as anticipated.  I provided 20 minutes of non-face-to-face time during this encounter.   Walker Kehr, MD

## 2018-05-05 NOTE — Assessment & Plan Note (Signed)
Pro-air HFA renewed Treats allergies

## 2018-05-19 DIAGNOSIS — M2012 Hallux valgus (acquired), left foot: Secondary | ICD-10-CM | POA: Diagnosis not present

## 2018-05-19 DIAGNOSIS — G894 Chronic pain syndrome: Secondary | ICD-10-CM | POA: Diagnosis not present

## 2018-05-19 DIAGNOSIS — M2011 Hallux valgus (acquired), right foot: Secondary | ICD-10-CM | POA: Diagnosis not present

## 2018-05-20 ENCOUNTER — Other Ambulatory Visit: Payer: Self-pay | Admitting: Internal Medicine

## 2018-05-20 MED ORDER — FAMOTIDINE 40 MG PO TABS: 40.0000 mg | ORAL_TABLET | Freq: Every day | ORAL | 3 refills | Status: DC

## 2018-06-16 DIAGNOSIS — M4807 Spinal stenosis, lumbosacral region: Secondary | ICD-10-CM | POA: Diagnosis not present

## 2018-06-16 DIAGNOSIS — M792 Neuralgia and neuritis, unspecified: Secondary | ICD-10-CM | POA: Diagnosis not present

## 2018-06-16 DIAGNOSIS — M47897 Other spondylosis, lumbosacral region: Secondary | ICD-10-CM | POA: Diagnosis not present

## 2018-06-16 DIAGNOSIS — G894 Chronic pain syndrome: Secondary | ICD-10-CM | POA: Diagnosis not present

## 2018-06-16 DIAGNOSIS — M48062 Spinal stenosis, lumbar region with neurogenic claudication: Secondary | ICD-10-CM | POA: Diagnosis not present

## 2018-06-16 DIAGNOSIS — G8929 Other chronic pain: Secondary | ICD-10-CM | POA: Diagnosis not present

## 2018-07-09 ENCOUNTER — Other Ambulatory Visit: Payer: Self-pay | Admitting: Internal Medicine

## 2018-07-09 DIAGNOSIS — Z1231 Encounter for screening mammogram for malignant neoplasm of breast: Secondary | ICD-10-CM

## 2018-07-14 DIAGNOSIS — G894 Chronic pain syndrome: Secondary | ICD-10-CM | POA: Diagnosis not present

## 2018-08-04 ENCOUNTER — Other Ambulatory Visit: Payer: Self-pay

## 2018-08-04 MED ORDER — NORTRIPTYLINE HCL 25 MG PO CAPS: 25.0000 mg | ORAL_CAPSULE | Freq: Every day | ORAL | 0 refills | Status: DC

## 2018-08-04 MED ORDER — GABAPENTIN 100 MG PO CAPS: 100.0000 mg | ORAL_CAPSULE | Freq: Three times a day (TID) | ORAL | 0 refills | Status: DC

## 2018-08-04 MED ORDER — MELOXICAM 15 MG PO TABS: 15.0000 mg | ORAL_TABLET | Freq: Every day | ORAL | 0 refills | Status: DC

## 2018-08-11 ENCOUNTER — Other Ambulatory Visit (INDEPENDENT_AMBULATORY_CARE_PROVIDER_SITE_OTHER): Payer: Medicare Other

## 2018-08-11 DIAGNOSIS — D751 Secondary polycythemia: Secondary | ICD-10-CM | POA: Diagnosis not present

## 2018-08-11 DIAGNOSIS — G894 Chronic pain syndrome: Secondary | ICD-10-CM | POA: Diagnosis not present

## 2018-08-11 LAB — CBC WITH DIFFERENTIAL/PLATELET
Basophils Absolute: 0.1 10*3/uL (ref 0.0–0.1)
Basophils Relative: 0.9 % (ref 0.0–3.0)
Eosinophils Absolute: 0.2 10*3/uL (ref 0.0–0.7)
Eosinophils Relative: 3.8 % (ref 0.0–5.0)
HCT: 43.4 % (ref 36.0–46.0)
Hemoglobin: 15.1 g/dL — ABNORMAL HIGH (ref 12.0–15.0)
Lymphocytes Relative: 37.7 % (ref 12.0–46.0)
Lymphs Abs: 2.3 10*3/uL (ref 0.7–4.0)
MCHC: 34.7 g/dL (ref 30.0–36.0)
MCV: 89.2 fl (ref 78.0–100.0)
Monocytes Absolute: 0.9 10*3/uL (ref 0.1–1.0)
Monocytes Relative: 15.2 % — ABNORMAL HIGH (ref 3.0–12.0)
Neutro Abs: 2.6 10*3/uL (ref 1.4–7.7)
Neutrophils Relative %: 42.4 % — ABNORMAL LOW (ref 43.0–77.0)
Platelets: 213 10*3/uL (ref 150.0–400.0)
RBC: 4.86 Mil/uL (ref 3.87–5.11)
RDW: 12.9 % (ref 11.5–15.5)
WBC: 6.2 10*3/uL (ref 4.0–10.5)

## 2018-08-18 DIAGNOSIS — G894 Chronic pain syndrome: Secondary | ICD-10-CM | POA: Diagnosis not present

## 2018-08-18 DIAGNOSIS — M545 Low back pain: Secondary | ICD-10-CM | POA: Diagnosis not present

## 2018-08-18 DIAGNOSIS — M5134 Other intervertebral disc degeneration, thoracic region: Secondary | ICD-10-CM | POA: Diagnosis not present

## 2018-08-18 DIAGNOSIS — M179 Osteoarthritis of knee, unspecified: Secondary | ICD-10-CM | POA: Diagnosis not present

## 2018-08-18 DIAGNOSIS — M5136 Other intervertebral disc degeneration, lumbar region: Secondary | ICD-10-CM | POA: Diagnosis not present

## 2018-08-25 ENCOUNTER — Other Ambulatory Visit: Payer: Self-pay

## 2018-08-25 ENCOUNTER — Ambulatory Visit
Admission: RE | Admit: 2018-08-25 | Discharge: 2018-08-25 | Disposition: A | Discharge status: Home / Self Care | Payer: Medicare Other | Source: Ambulatory Visit | Attending: Internal Medicine | Admitting: Internal Medicine

## 2018-08-25 DIAGNOSIS — Z1231 Encounter for screening mammogram for malignant neoplasm of breast: Secondary | ICD-10-CM | POA: Diagnosis not present

## 2018-08-27 ENCOUNTER — Other Ambulatory Visit: Payer: Self-pay | Admitting: Internal Medicine

## 2018-08-27 ENCOUNTER — Ambulatory Visit (INDEPENDENT_AMBULATORY_CARE_PROVIDER_SITE_OTHER): Payer: Medicare Other | Admitting: Internal Medicine

## 2018-08-27 ENCOUNTER — Encounter: Payer: Self-pay | Admitting: Internal Medicine

## 2018-08-27 ENCOUNTER — Other Ambulatory Visit: Payer: Self-pay

## 2018-08-27 DIAGNOSIS — D751 Secondary polycythemia: Secondary | ICD-10-CM

## 2018-08-27 DIAGNOSIS — Z8249 Family history of ischemic heart disease and other diseases of the circulatory system: Secondary | ICD-10-CM

## 2018-08-27 DIAGNOSIS — K219 Gastro-esophageal reflux disease without esophagitis: Secondary | ICD-10-CM

## 2018-08-27 DIAGNOSIS — F17200 Nicotine dependence, unspecified, uncomplicated: Secondary | ICD-10-CM

## 2018-08-27 DIAGNOSIS — F988 Other specified behavioral and emotional disorders with onset usually occurring in childhood and adolescence: Secondary | ICD-10-CM | POA: Diagnosis not present

## 2018-08-27 DIAGNOSIS — F411 Generalized anxiety disorder: Secondary | ICD-10-CM

## 2018-08-27 DIAGNOSIS — R000 Tachycardia, unspecified: Secondary | ICD-10-CM

## 2018-08-27 NOTE — Assessment & Plan Note (Signed)
Adderall

## 2018-08-27 NOTE — Assessment & Plan Note (Signed)
Diazepam prn 2018 pt is seeing Dr  Vena Rua (Psychiatry) 4/17 start Belsomra  Potential benefits of a long term Belsomra  use as well as potential risks  and complications were explained to the patient and were aknowledged. Other drugs interactions aknowledged

## 2018-08-27 NOTE — Assessment & Plan Note (Signed)
Discussed.

## 2018-08-27 NOTE — Assessment & Plan Note (Signed)
CBC

## 2018-08-27 NOTE — Assessment & Plan Note (Signed)
Protonix, Pepcid

## 2018-08-27 NOTE — Patient Instructions (Addendum)
Cardiac CT calcium scoring test $150   Computed tomography, more commonly known as a CT or CAT scan, is a diagnostic medical imaging test. Like traditional x-rays, it produces multiple images or pictures of the inside of the body. The cross-sectional images generated during a CT scan can be reformatted in multiple planes. They can even generate three-dimensional images. These images can be viewed on a computer monitor, printed on film or by a 3D printer, or transferred to a CD or DVD. CT images of internal organs, bones, soft tissue and blood vessels provide greater detail than traditional x-rays, particularly of soft tissues and blood vessels. A cardiac CT scan for coronary calcium is a non-invasive way of obtaining information about the presence, location and extent of calcified plaque in the coronary arteries-the vessels that supply oxygen-containing blood to the heart muscle. Calcified plaque results when there is a build-up of fat and other substances under the inner layer of the artery. This material can calcify which signals the presence of atherosclerosis, a disease of the vessel wall, also called coronary artery disease (CAD). People with this disease have an increased risk for heart attacks. In addition, over time, progression of plaque build up (CAD) can narrow the arteries or even close off blood flow to the heart. The result may be chest pain, sometimes called "angina," or a heart attack. Because calcium is a marker of CAD, the amount of calcium detected on a cardiac CT scan is a helpful prognostic tool. The findings on cardiac CT are expressed as a calcium score. Another name for this test is coronary artery calcium scoring.  What are some common uses of the procedure? The goal of cardiac CT scan for calcium scoring is to determine if CAD is present and to what extent, even if there are no symptoms. It is a screening study that may be recommended by a physician for patients with risk factors  for CAD but no clinical symptoms. The major risk factors for CAD are: . high blood cholesterol levels  . family history of heart attacks  . diabetes  . high blood pressure  . cigarette smoking  . overweight or obese  . physical inactivity   A negative cardiac CT scan for calcium scoring shows no calcification within the coronary arteries. This suggests that CAD is absent or so minimal it cannot be seen by this technique. The chance of having a heart attack over the next two to five years is very low under these circumstances. A positive test means that CAD is present, regardless of whether or not the patient is experiencing any symptoms. The amount of calcification-expressed as the calcium score-may help to predict the likelihood of a myocardial infarction (heart attack) in the coming years and helps your medical doctor or cardiologist decide whether the patient may need to take preventive medicine or undertake other measures such as diet and exercise to lower the risk for heart attack. The extent of CAD is graded according to your calcium score:  Calcium Score  Presence of CAD (coronary artery disease)  0 No evidence of CAD   1-10 Minimal evidence of CAD  11-100 Mild evidence of CAD  101-400 Moderate evidence of CAD  Over 400 Extensive evidence of CAD      If you have medicare related insurance (such as traditional Medicare, Blue Cross Medicare, United HealthCare Medicare, or similar), Please make an appointment at the scheduling desk with Jill, the Wellness Health Coach, for your Wellness visit in this office,   which is a benefit with your insurance.  

## 2018-08-27 NOTE — Progress Notes (Signed)
car

## 2018-08-27 NOTE — Progress Notes (Signed)
Subjective:  Patient ID: [name], female    DOB: [birth year]  Age: 48 y.o. MRN: [id number]  CC: No chief complaint on file.   HPI [name] presents for asthma, ADD, LBP, GERD f/u  Outpatient Medications Prior to Visit  Medication Sig Dispense Refill  . albuterol (PROAIR HFA) 108 (90 Base) MCG/ACT inhaler Inhale 1-2 puffs into the lungs every 4 (four) hours as needed for wheezing or shortness of breath. 8.5 Inhaler 5  . albuterol (PROVENTIL) (2.5 MG/3ML) 0.083% nebulizer solution Take 3 mLs (2.5 mg total) by nebulization every 6 (six) hours as needed. For shortness of breath. 75 mL 0  . amphetamine-dextroamphetamine (ADDERALL) 10 MG tablet Take 10 mg by mouth daily.  0  . Asenapine Maleate (SAPHRIS) 10 MG SUBL Place under the tongue at bedtime.    [name] aspirin (BAYER ASPIRIN) 325 MG tablet Take 1 tablet (325 mg total) by mouth daily. 100 tablet 3  . azithromycin (ZITHROMAX Z-PAK) 250 MG tablet 2 tab by mouth day 1, then 1 per day 6 tablet 1  . budesonide-formoterol (SYMBICORT) 160-4.5 MCG/ACT inhaler INHALE 2 PUFFS BY MOUTH TWICE A DAY 10.2 Inhaler 5  . ciclopirox (PENLAC) 8 % solution Apply topically at bedtime. Apply over nail and surrounding skin. Apply daily over previous coat. After seven (7) days, may remove with alcohol and continue cycle. 6.6 mL 0  . cyclobenzaprine (FLEXERIL) 10 MG tablet TAKE 1/2 - 1 TABLET BY MOUTH DAILY OR TWICE DAILY IF TOLERATED  0  . eletriptan (RELPAX) 40 MG tablet Take 1 tablet (40 mg total) by mouth as needed for migraine or headache. May repeat in 2 hours if headache persists or recurs. 15 tablet 6  . famotidine (PEPCID) 40 MG tablet Take 1 tablet (40 mg total) by mouth daily. 90 tablet 3  . gabapentin (NEURONTIN) 100 MG capsule Take 1 capsule (100 mg total) by mouth 3 (three) times daily. Keep scheduled appt for future refills 270 capsule 0  . HYDROcodone-acetaminophen (NORCO/VICODIN) 5-325 MG tablet Take 1-2 tablets by mouth every 4 (four)  hours as needed for moderate pain. (Patient taking differently: Take 1-2 tablets by mouth every 4 (four) hours as needed for moderate pain. 1/2-1 tablet every day PRN, Moderate pain) 30 tablet 0  . linaclotide (LINZESS) 145 MCG CAPS capsule Take 1-2 capsules (145-290 mcg total) by mouth daily as needed. 180 capsule 3  . meloxicam (MOBIC) 15 MG tablet Take 1 tablet (15 mg total) by mouth daily. Keep scheduled appt for future refills 90 tablet 0  . metoprolol tartrate (LOPRESSOR) 100 MG tablet Take 1 tablet (100 mg total) by mouth 2 (two) times daily. 180 tablet 3  . nortriptyline (PAMELOR) 25 MG capsule Take 1 capsule (25 mg total) by mouth at bedtime. Keep scheduled appt for future refills 90 capsule 0  . pantoprazole (PROTONIX) 40 MG tablet Take 1 tablet (40 mg total) by mouth daily. 90 tablet 3  . traMADol (ULTRAM) 50 MG tablet Limit 1 tablet by mouth 4 - 8  times per day if tolerated 240 tablet 0  . zolpidem (AMBIEN) 5 MG tablet Take 5 mg by mouth daily.  1   No facility-administered medications prior to visit.     ROS: Review of Systems  Constitutional: Negative for activity change, appetite change, chills, fatigue and unexpected weight change.  HENT: Negative for congestion, mouth sores and sinus pressure.   Eyes: Negative for visual disturbance.  Respiratory: Negative for cough and chest tightness.  Gastrointestinal: Negative for abdominal pain and nausea.  Genitourinary: Negative for difficulty urinating, frequency and vaginal pain.  Musculoskeletal: Positive for back pain. Negative for gait problem.  Skin: Negative for pallor and rash.  Neurological: Negative for dizziness, tremors, weakness, numbness and headaches.  Psychiatric/Behavioral: Positive for decreased concentration. Negative for confusion, sleep disturbance and suicidal ideas. The patient is nervous/anxious.     Objective:  BP 102/80 (BP Location: Left Arm, Patient Position: Sitting, Cuff Size: Large)   Pulse (!) 113    Temp 98.5 F (36.9 C) (Oral)   Ht 5\' 6"  (1.676 m)   Wt 202 lb (91.6 kg)   SpO2 97%   BMI 32.60 kg/m   BP Readings from Last 3 Encounters:  08/27/18 102/80  02/26/18 122/76  11/03/17 122/86    Wt Readings from Last 3 Encounters:  08/27/18 202 lb (91.6 kg)  02/26/18 207 lb (93.9 kg)  11/03/17 197 lb (89.4 kg)    Physical Exam Constitutional:      General: She is not in acute distress.    Appearance: She is well-developed. She is obese.  HENT:     Head: Normocephalic.     Right Ear: External ear normal.     Left Ear: External ear normal.     Nose: Nose normal.  Eyes:     General:        Right eye: No discharge.        Left eye: No discharge.     Conjunctiva/sclera: Conjunctivae normal.     Pupils: Pupils are equal, round, and reactive to light.  Neck:     Musculoskeletal: Normal range of motion and neck supple.     Thyroid: No thyromegaly.     Vascular: No JVD.     Trachea: No tracheal deviation.  Cardiovascular:     Rate and Rhythm: Normal rate and regular rhythm.     Heart sounds: Normal heart sounds.  Pulmonary:     Effort: No respiratory distress.     Breath sounds: No stridor. No wheezing.  Abdominal:     General: Bowel sounds are normal. There is no distension.     Palpations: Abdomen is soft. There is no mass.     Tenderness: There is no abdominal tenderness. There is no guarding or rebound.  Musculoskeletal:        General: Tenderness present.  Lymphadenopathy:     Cervical: No cervical adenopathy.  Skin:    Findings: No erythema or rash.  Neurological:     Cranial Nerves: No cranial nerve deficit.     Motor: No abnormal muscle tone.     Coordination: Coordination normal.     Deep Tendon Reflexes: Reflexes normal.  Psychiatric:        Behavior: Behavior normal.        Thought Content: Thought content normal.        Judgment: Judgment normal.   LS tender a little  Lab Results  Component Value Date   WBC 6.2 08/11/2018   HGB 15.1 (H)  08/11/2018   HCT 43.4 08/11/2018   PLT 213.0 08/11/2018   GLUCOSE 95 02/26/2018   CHOL 163 02/26/2018   TRIG 209.0 (H) 02/26/2018   HDL 26.70 (L) 02/26/2018   LDLDIRECT 121.0 02/26/2018   LDLCALC 110 (H) 01/30/2017   ALT 16 02/26/2018   AST 12 02/26/2018   NA 138 02/26/2018   K 4.4 02/26/2018   CL 102 02/26/2018   CREATININE 0.66 02/26/2018   BUN 10 02/26/2018  CO2 26 02/26/2018   TSH 1.50 02/26/2018   INR 1.05 08/29/2014   HGBA1C 5.8 02/26/2018    Mm 3d Screen Breast Bilateral  Result Date: 08/25/2018 CLINICAL DATA:  Screening. EXAM: DIGITAL SCREENING BILATERAL MAMMOGRAM WITH TOMO AND CAD COMPARISON:  Previous exam(s). ACR Breast Density Category b: There are scattered areas of fibroglandular density. FINDINGS: There are no findings suspicious for malignancy. Images were processed with CAD. IMPRESSION: No mammographic evidence of malignancy. A result letter of this screening mammogram will be mailed directly to the patient. RECOMMENDATION: Screening mammogram in one year. (Code:SM-B-01Y) BI-RADS CATEGORY  1: Negative. Electronically Signed   By: Marin Olp M.D.   On: 08/25/2018 12:48    Assessment & Plan:   There are no diagnoses linked to this encounter.   No orders of the defined types were placed in this encounter.    Follow-up: No follow-ups on file.  Walker Kehr, MD

## 2018-09-08 DIAGNOSIS — M4807 Spinal stenosis, lumbosacral region: Secondary | ICD-10-CM | POA: Diagnosis not present

## 2018-09-08 DIAGNOSIS — G894 Chronic pain syndrome: Secondary | ICD-10-CM | POA: Diagnosis not present

## 2018-09-08 DIAGNOSIS — M48062 Spinal stenosis, lumbar region with neurogenic claudication: Secondary | ICD-10-CM | POA: Diagnosis not present

## 2018-09-08 DIAGNOSIS — G8929 Other chronic pain: Secondary | ICD-10-CM | POA: Diagnosis not present

## 2018-09-08 DIAGNOSIS — M792 Neuralgia and neuritis, unspecified: Secondary | ICD-10-CM | POA: Diagnosis not present

## 2018-09-08 DIAGNOSIS — M47897 Other spondylosis, lumbosacral region: Secondary | ICD-10-CM | POA: Diagnosis not present

## 2018-09-15 IMAGING — CR DG THORACIC SPINE 2V
1 series · 2 of 2 positions shown · non-contrast
Comparison: 01/12/2015

CLINICAL DATA: Fall yesterday

EXAM:
THORACIC SPINE 2 VIEWS

[Series 1: dg thoracic spine 2 view · 0.14mm/px · 2 of 2 slices shown]
[im 1/2]
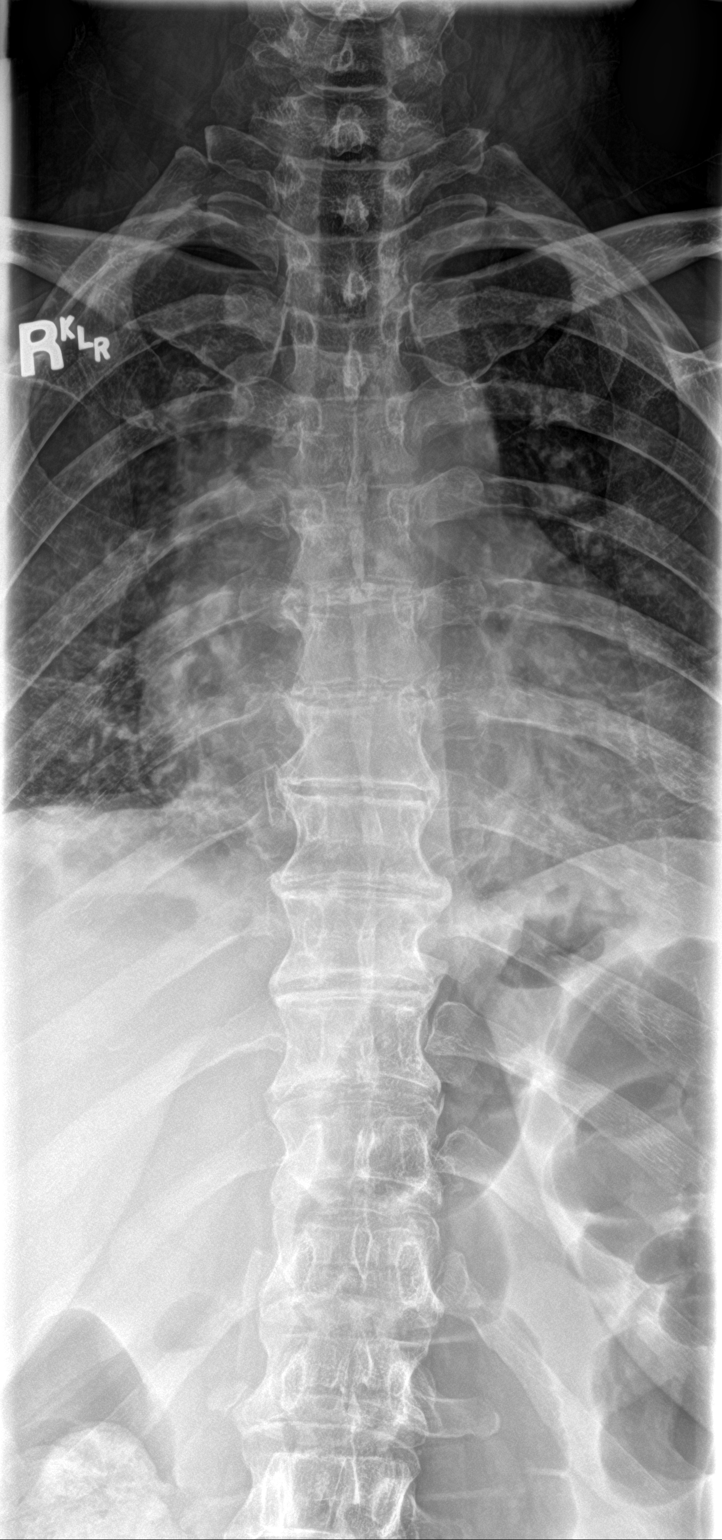
[im 2/2]
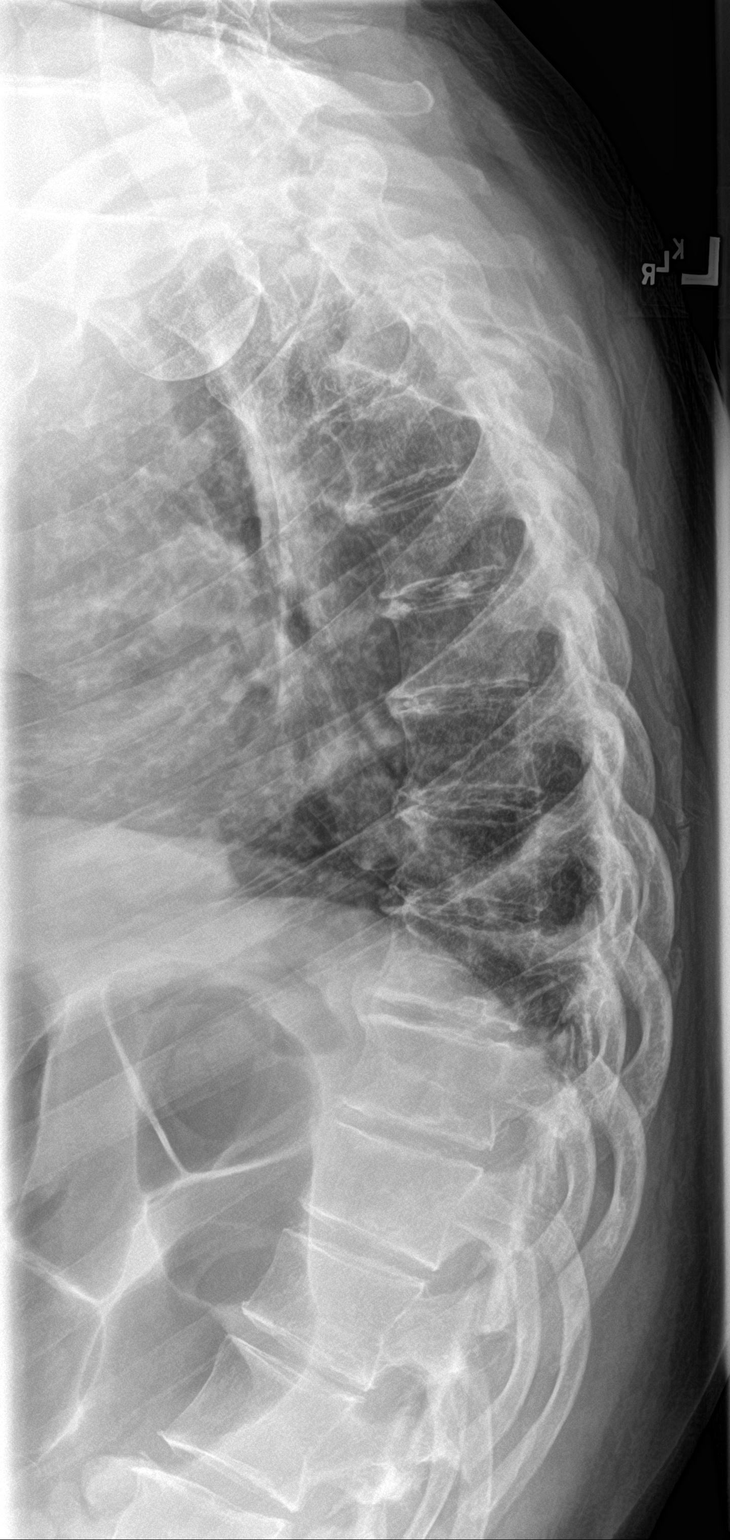

[2 of 2 positions shown; findings below may reference images not displayed]

FINDINGS: Anatomic alignment. No acute vertebral compression deformity. No
obvious acute rib fracture. Osteophytes is seen throughout the
anterior thoracic spine.
IMPRESSION: No acute bony pathology.

## 2018-09-17 ENCOUNTER — Ambulatory Visit (INDEPENDENT_AMBULATORY_CARE_PROVIDER_SITE_OTHER): Payer: Medicare Other | Admitting: Internal Medicine

## 2018-09-17 ENCOUNTER — Other Ambulatory Visit: Payer: Self-pay | Admitting: Internal Medicine

## 2018-09-17 ENCOUNTER — Other Ambulatory Visit: Payer: Self-pay

## 2018-09-17 ENCOUNTER — Encounter: Payer: Self-pay | Admitting: Internal Medicine

## 2018-09-17 ENCOUNTER — Telehealth: Payer: Self-pay

## 2018-09-17 DIAGNOSIS — K645 Perianal venous thrombosis: Secondary | ICD-10-CM

## 2018-09-17 DIAGNOSIS — K921 Melena: Secondary | ICD-10-CM | POA: Diagnosis not present

## 2018-09-17 DIAGNOSIS — I10 Essential (primary) hypertension: Secondary | ICD-10-CM

## 2018-09-17 MED ORDER — LIDOCAINE-HYDROCORTISONE ACE 3-0.5 % EX CREA: TOPICAL_CREAM | CUTANEOUS | 0 refills | Status: DC

## 2018-09-17 MED ORDER — LIDOCAINE 5 % EX OINT: 1.0000 "application " | TOPICAL_OINTMENT | CUTANEOUS | 0 refills | Status: DC | PRN

## 2018-09-17 MED ORDER — TRIAMCINOLONE ACETONIDE 0.5 % EX OINT: 1.0000 "application " | TOPICAL_OINTMENT | Freq: Four times a day (QID) | CUTANEOUS | 1 refills | Status: DC

## 2018-09-17 NOTE — Assessment & Plan Note (Signed)
stable overall by history and exam, recent data reviewed with pt, and pt to continue medical treatment as before,  to f/u any worsening symptoms or concerns  

## 2018-09-17 NOTE — Assessment & Plan Note (Addendum)
Markedly tender and relatively large, declines surgical referral, ok for [facility] asd,  to f/u any worsening symptoms or concerns  Addendum - pt asks for general surgury referral

## 2018-09-17 NOTE — Telephone Encounter (Signed)
Ok this will be done, thanks

## 2018-09-17 NOTE — Assessment & Plan Note (Signed)
Very minor, most likely related to thrombosed hemorrhoid, no active bleeding, pt declines lab or GI evaluation

## 2018-09-17 NOTE — Telephone Encounter (Signed)
Patient informed of MD response and stated understanding  

## 2018-09-17 NOTE — Addendum Note (Signed)
Addended by: Biagio Borg on: 09/17/2018 12:16 PM   Modules accepted: Orders

## 2018-09-17 NOTE — Patient Instructions (Signed)
Please take all new medication as prescribed - the cream for the pain and swelling  Please continue all other medications as before, and refills have been done if requested.  Please have the pharmacy call with any other refills you may need.  Please continue your efforts at being more active, low cholesterol diet, and weight control  Please keep your appointments with your specialists as you may have planned

## 2018-09-17 NOTE — Telephone Encounter (Signed)
Copied from [place] [id number]. Topic: Referral - Request for Referral >> Sep 17, 2018  8:41 AM Burchel, Abbi R wrote: Pt states she just saw Dr Jenny Reichmann and expressed that she would like to treat hemorrhoid issue with medication only, but pt has changed her mind and would like a referral to surgeon.  Please call pt if needed: [phone].

## 2018-09-17 NOTE — Progress Notes (Signed)
Subjective:    Patient ID: [name], female    DOB: November 03, 1970, 48 y.o.   MRN: [id number]  HPI    Here with acute onset mod to severe pain near anal for 1 wk, with small amount of BRB, no fever and no other drainage.  Denies worsening reflux, abd pain, dysphagia, n/v, bowel change or blood.  Has hx of hemorrhoid and fissure in the past.  No prior hx of colonscopy, no FH of GI malignancy.  Pt denies chest pain, increased sob or doe, wheezing, orthopnea, PND, increased LE swelling, palpitations, dizziness or syncope.  More fatigued due to less sleep pain persists at night with any movement.  Past Medical History:  Diagnosis Date  . Anxiety state, unspecified   . Arthritis   . Asthma   . Attention deficit disorder without mention of hyperactivity   . Bipolar disorder ([place])   . Constipation    takes Linzess  . Depressive disorder, not elsewhere classified   . Esophageal reflux   . Hypertension   . Migraine without aura, without mention of intractable migraine without mention of status migrainosus   . Pain in joint, site unspecified   . Pneumonia   . Polycythemia    per pt  . Unspecified asthma(493.90)   . Unspecified essential hypertension   . Unspecified vitamin D deficiency    Past Surgical History:  Procedure Laterality Date  . ABDOMINAL HYSTERECTOMY    . abdominal ultrasound  10/12/97  . APPENDECTOMY  05/27/2013   gangrenous  . INCISE AND DRAIN ABCESS  2011   Right axilla- Dr. Barkley Bruns  . KNEE ARTHROSCOPY     both knees  . KNEE ARTHROSCOPY WITH FULKERSON SLIDE Right 10/17/2015   Procedure: KNEE ARTHROSCOPY WITH Elmarie Mainland;  Surgeon: Melrose Nakayama, MD;  Location: [place];  Service: Orthopedics;  Laterality: Right;  . NM MYOCAR PERF WALL MOTION  11/2009   bruce myoview; perfusion defect in anterior region consistent with breast attenuation; remaining myocardium with normal perfusion; post-stress EF 74%; low risk scan   . ORIF TIBIA FRACTURE Right 11/24/2015   Procedure: OPEN REDUCTION INTERNAL FIXATION (ORIF) TIBIA FRACTURE;  Surgeon: Melrose Nakayama, MD;  Location: [facility];  Service: Orthopedics;  Laterality: Right;  . PARTIAL HYSTERECTOMY  2007  . PLANTAR FASCIA SURGERY  2000  . TONSILLECTOMY  04/29/00  . TONSILLECTOMY Bilateral   . TRANSTHORACIC ECHOCARDIOGRAM  11/2009   EF=>55%; trace MR & TR    reports that she has been smoking cigarettes. She started smoking about 31 years ago. She has a 27.00 pack-year smoking history. She has never used smokeless tobacco. She reports that she does not drink alcohol or use drugs. family history includes ADD / ADHD in her child; Alcohol abuse in her brother and sister; Asthma in her child and another family member; Bipolar disorder in her sister; Breast cancer in her mother; COPD in her paternal grandmother; Cancer (age of onset: 2) in her mother; Coronary artery disease in an other family member; Depression in her mother; Diabetes in her paternal grandfather and another family member; Heart disease in her father, maternal grandfather, and paternal grandfather; Lupus in her sister; Multiple sclerosis in her mother. Allergies  Allergen Reactions  . Lisinopril Cough  . Nizoral [Ketoconazole]     Felt drunk w/tablets  . Amoxicillin Itching    REACTION: QUESTIONABLE  . Ancef [Cefazolin] Itching  . Doxycycline Nausea And Vomiting  . Hydrochlorothiazide Other (See Comments)    CRAMPS AT HIGHER DOSAGES  .  Penicillins Itching    Has patient had a PCN reaction causing immediate rash, facial/tongue/throat swelling, SOB or lightheadedness with hypotension: No Has patient had a PCN reaction causing severe rash involving mucus membranes or skin necrosis: No Has patient had a PCN reaction that required hospitalization No Has patient had a PCN reaction occurring within the last 10 years: No If all of the above answers are "NO", then may proceed with Cephalosporin use.   [name] Percocet [Oxycodone-Acetaminophen] Itching  .  Talwin [Pentazocine] Nausea And Vomiting   Current Outpatient Medications on File Prior to Visit  Medication Sig Dispense Refill  . albuterol (PROAIR HFA) 108 (90 Base) MCG/ACT inhaler Inhale 1-2 puffs into the lungs every 4 (four) hours as needed for wheezing or shortness of breath. 8.5 Inhaler 5  . albuterol (PROVENTIL) (2.5 MG/3ML) 0.083% nebulizer solution Take 3 mLs (2.5 mg total) by nebulization every 6 (six) hours as needed. For shortness of breath. 75 mL 0  . amphetamine-dextroamphetamine (ADDERALL) 10 MG tablet Take 10 mg by mouth daily.  0  . Asenapine Maleate (SAPHRIS) 10 MG SUBL Place under the tongue at bedtime.    [name] aspirin (BAYER ASPIRIN) 325 MG tablet Take 1 tablet (325 mg total) by mouth daily. 100 tablet 3  . azithromycin (ZITHROMAX Z-PAK) 250 MG tablet 2 tab by mouth day 1, then 1 per day 6 tablet 1  . budesonide-formoterol (SYMBICORT) 160-4.5 MCG/ACT inhaler INHALE 2 PUFFS BY MOUTH TWICE A DAY 10.2 Inhaler 5  . ciclopirox (PENLAC) 8 % solution Apply topically at bedtime. Apply over nail and surrounding skin. Apply daily over previous coat. After seven (7) days, may remove with alcohol and continue cycle. 6.6 mL 0  . cyclobenzaprine (FLEXERIL) 10 MG tablet TAKE 1/2 - 1 TABLET BY MOUTH DAILY OR TWICE DAILY IF TOLERATED  0  . eletriptan (RELPAX) 40 MG tablet Take 1 tablet (40 mg total) by mouth as needed for migraine or headache. May repeat in 2 hours if headache persists or recurs. 15 tablet 6  . famotidine (PEPCID) 40 MG tablet Take 1 tablet (40 mg total) by mouth daily. 90 tablet 3  . gabapentin (NEURONTIN) 100 MG capsule Take 1 capsule (100 mg total) by mouth 3 (three) times daily. Keep scheduled appt for future refills 270 capsule 0  . HYDROcodone-acetaminophen (NORCO/VICODIN) 5-325 MG tablet Take 1-2 tablets by mouth every 4 (four) hours as needed for moderate pain. (Patient taking differently: Take 1-2 tablets by mouth every 4 (four) hours as needed for moderate pain. 1/2-1  tablet every day PRN, Moderate pain) 30 tablet 0  . linaclotide (LINZESS) 145 MCG CAPS capsule Take 1-2 capsules (145-290 mcg total) by mouth daily as needed. 180 capsule 3  . meloxicam (MOBIC) 15 MG tablet Take 1 tablet (15 mg total) by mouth daily. Keep scheduled appt for future refills 90 tablet 0  . metoprolol tartrate (LOPRESSOR) 100 MG tablet Take 1 tablet (100 mg total) by mouth 2 (two) times daily. 180 tablet 3  . nortriptyline (PAMELOR) 25 MG capsule Take 1 capsule (25 mg total) by mouth at bedtime. Keep scheduled appt for future refills 90 capsule 0  . pantoprazole (PROTONIX) 40 MG tablet Take 1 tablet (40 mg total) by mouth daily. 90 tablet 3  . traMADol (ULTRAM) 50 MG tablet Limit 1 tablet by mouth 4 - 8  times per day if tolerated 240 tablet 0  . zolpidem (AMBIEN) 5 MG tablet Take 5 mg by mouth daily.  1   No  current facility-administered medications on file prior to visit.    Review of Systems .iinros All other system neg per pt    Objective:   Physical Exam BP 110/84 (BP Location: Left Arm, Patient Position: Sitting, Cuff Size: Large)   Pulse 72   Temp 98.5 F (36.9 C) (Oral)   Ht 5\' 6"  (1.676 m)   Wt 201 lb (91.2 kg)   SpO2 96%   BMI 32.44 kg/m  VS noted,  Constitutional: Pt appears in NAD HENT: Head: NCAT.  Right Ear: External ear normal.  Left Ear: External ear normal.  Eyes: . Pupils are equal, round, and reactive to light. Conjunctivae and EOM are normal Nose: without d/c or deformity Neck: Neck supple. Gross normal ROM Cardiovascular: Normal rate and regular rhythm.   Pulmonary/Chest: Effort normal and breath sounds without rales or wheezing.  Abd:  Soft, NT, ND, + BS, no organomegaly DRE:  Visual exam only, pt has > 1 cm large tender thrombosed hemorrhoid without obvious bleeding, no sign of cellulitis or abscess or fissure Neurological: Pt is alert. At baseline orientation, motor grossly intact Skin: Skin is warm. No rashes, other new lesions, no LE edema  Psychiatric: Pt behavior is normal without agitation  No other exam findings Lab Results  Component Value Date   WBC 6.2 08/11/2018   HGB 15.1 (H) 08/11/2018   HCT 43.4 08/11/2018   PLT 213.0 08/11/2018   GLUCOSE 95 02/26/2018   CHOL 163 02/26/2018   TRIG 209.0 (H) 02/26/2018   HDL 26.70 (L) 02/26/2018   LDLDIRECT 121.0 02/26/2018   LDLCALC 110 (H) 01/30/2017   ALT 16 02/26/2018   AST 12 02/26/2018   NA 138 02/26/2018   K 4.4 02/26/2018   CL 102 02/26/2018   CREATININE 0.66 02/26/2018   BUN 10 02/26/2018   CO2 26 02/26/2018   TSH 1.50 02/26/2018   INR 1.05 08/29/2014   HGBA1C 5.8 02/26/2018       Assessment & Plan:

## 2018-09-22 ENCOUNTER — Other Ambulatory Visit: Payer: Self-pay | Admitting: Internal Medicine

## 2018-09-23 ENCOUNTER — Other Ambulatory Visit: Payer: Self-pay | Admitting: Internal Medicine

## 2018-09-25 DIAGNOSIS — M25561 Pain in right knee: Secondary | ICD-10-CM | POA: Diagnosis not present

## 2018-09-25 DIAGNOSIS — M1711 Unilateral primary osteoarthritis, right knee: Secondary | ICD-10-CM | POA: Diagnosis not present

## 2018-09-30 ENCOUNTER — Telehealth: Payer: Medicare Other | Admitting: Family

## 2018-09-30 DIAGNOSIS — J4520 Mild intermittent asthma, uncomplicated: Secondary | ICD-10-CM

## 2018-09-30 DIAGNOSIS — F17200 Nicotine dependence, unspecified, uncomplicated: Secondary | ICD-10-CM | POA: Diagnosis not present

## 2018-09-30 MED ORDER — PREDNISONE 20 MG PO TABS: 40.0000 mg | ORAL_TABLET | Freq: Every day | ORAL | 0 refills | Status: AC

## 2018-09-30 NOTE — Progress Notes (Signed)
E Visit for Asthma  Based on what you have shared with me, it looks like you may have a flare up of your asthma.  Asthma is a chronic (ongoing) lung disease which results in airway obstruction, inflammation and hyper-responsiveness.   Asthma symptoms vary from person to person, with common symptoms including nighttime awakening and decreased ability to participate in normal activities as a result of shortness of breath. It is often triggered by changes in weather, changes in the season, changes in air temperature, or inside (home, school, daycare or work) allergens such as animal dander, mold, mildew, woodstoves or cockroaches.   It can also be triggered by hormonal changes, extreme emotion, physical exertion or an upper respiratory tract illness.     It is important to identify the trigger, and then eliminate or avoid the trigger if possible.   If you have been prescribed medications to be taken on a regular basis, it is important to follow the asthma action plan and to follow guidelines to adjust medication in response to increasing symptoms of decreased peak expiratory flow rate  Treatment: I have prescribed: Prednisone 40mg  by mouth per day for 7 days. Continues using your inhalers. If your symptoms worsen, especially your chest tightness and shortness of breath you need to be seen face to face!  Approximately 5 minutes was spent documenting and reviewing patient's chart.    HOME CARE . Only take medications as instructed by your medical team. . Consider wearing a mask or scarf to improve breathing air temperature have been shown to decrease irritation and decrease exacerbations . Get rest. . Taking a steamy shower or using a humidifier may help nasal congestion sand ease sore throat pain. You can place a towel over your head and breathe in the steam from hot water coming from a  faucet. . Using a saline nasal spray works much the same way.  . Cough drops, hare candies and sore throat lozenges may ease your cough.  . Avoid close contacts especially the very you and the elderly . Cover your mouth if you cough or sneeze . Always remember to wash your hands.    GET HELP RIGHT AWAY IF: . You develop worsening symptoms; breathlessness at rest, drowsy, confused or agitated, unable to speak in full sentences . You have coughing fits . You develop a severe headache or visual changes . You develop shortness of breath, difficulty breathing or start having chest pain . Your symptoms persist after you have completed your treatment plan . If your symptoms do not improve within 10 days  MAKE SURE YOU . Understand these instructions. . Will watch your condition. . Will get help right away if you are not doing well or get worse.   Your e-visit answers were reviewed by a board certified advanced clinical practitioner to complete your personal care plan, Depending upon the condition, your plan could have included both over the counter or prescription medications.  Please review your pharmacy choice. Your safety is important to [place]. If you have drug allergies check your prescription carefully. You can use MyChart to ask questions about today's visit, request a non-urgent call back, or ask for a work or school excuse for 24 hours related to this e-Visit. If it has been greater than 24 hours you will need to follow up with your provider, or enter a new e-Visit to address those concerns.  You will get an e-mail in the next two days asking about your experience. I hope that your  e-visit has been valuable and will speed your recovery. Thank you for using e-visits.

## 2018-10-07 DIAGNOSIS — G894 Chronic pain syndrome: Secondary | ICD-10-CM | POA: Diagnosis not present

## 2018-10-09 ENCOUNTER — Other Ambulatory Visit: Payer: Self-pay

## 2018-10-09 MED ORDER — FAMOTIDINE 40 MG PO TABS: 40.0000 mg | ORAL_TABLET | Freq: Every day | ORAL | 3 refills | Status: DC

## 2018-11-02 ENCOUNTER — Other Ambulatory Visit: Payer: Self-pay

## 2018-11-02 ENCOUNTER — Ambulatory Visit (INDEPENDENT_AMBULATORY_CARE_PROVIDER_SITE_OTHER)
Admission: RE | Admit: 2018-11-02 | Discharge: 2018-11-02 | Disposition: A | Discharge status: Home / Self Care | Payer: Self-pay | Source: Ambulatory Visit | Attending: Internal Medicine | Admitting: Internal Medicine

## 2018-11-02 DIAGNOSIS — F17200 Nicotine dependence, unspecified, uncomplicated: Secondary | ICD-10-CM

## 2018-11-02 DIAGNOSIS — Z8249 Family history of ischemic heart disease and other diseases of the circulatory system: Secondary | ICD-10-CM

## 2018-11-03 DIAGNOSIS — G894 Chronic pain syndrome: Secondary | ICD-10-CM | POA: Diagnosis not present

## 2018-11-06 MED ORDER — LINACLOTIDE 145 MCG PO CAPS: 145.0000 ug | ORAL_CAPSULE | Freq: Every day | ORAL | 3 refills | Status: DC | PRN

## 2018-11-08 ENCOUNTER — Encounter: Payer: Self-pay | Admitting: Internal Medicine

## 2018-11-08 ENCOUNTER — Other Ambulatory Visit: Payer: Self-pay | Admitting: Internal Medicine

## 2018-11-08 DIAGNOSIS — I2510 Atherosclerotic heart disease of native coronary artery without angina pectoris: Secondary | ICD-10-CM | POA: Insufficient documentation

## 2018-11-08 DIAGNOSIS — I2583 Coronary atherosclerosis due to lipid rich plaque: Secondary | ICD-10-CM

## 2018-12-01 DIAGNOSIS — M545 Low back pain: Secondary | ICD-10-CM | POA: Diagnosis not present

## 2018-12-01 DIAGNOSIS — G894 Chronic pain syndrome: Secondary | ICD-10-CM | POA: Diagnosis not present

## 2018-12-01 DIAGNOSIS — M5134 Other intervertebral disc degeneration, thoracic region: Secondary | ICD-10-CM | POA: Diagnosis not present

## 2018-12-01 DIAGNOSIS — M5136 Other intervertebral disc degeneration, lumbar region: Secondary | ICD-10-CM | POA: Diagnosis not present

## 2018-12-17 ENCOUNTER — Other Ambulatory Visit: Payer: Self-pay | Admitting: Internal Medicine

## 2018-12-30 ENCOUNTER — Other Ambulatory Visit: Payer: Self-pay

## 2018-12-30 ENCOUNTER — Encounter: Payer: Self-pay | Admitting: Cardiovascular Disease

## 2018-12-30 ENCOUNTER — Ambulatory Visit: Payer: Medicare Other | Admitting: Cardiovascular Disease

## 2018-12-30 VITALS — BP 104/78 | HR 71 | Temp 97.3°F | Ht 66.0 in | Wt 197.0 lb

## 2018-12-30 DIAGNOSIS — I10 Essential (primary) hypertension: Secondary | ICD-10-CM | POA: Diagnosis not present

## 2018-12-30 DIAGNOSIS — M25512 Pain in left shoulder: Secondary | ICD-10-CM | POA: Diagnosis not present

## 2018-12-30 DIAGNOSIS — E782 Mixed hyperlipidemia: Secondary | ICD-10-CM

## 2018-12-30 DIAGNOSIS — I2510 Atherosclerotic heart disease of native coronary artery without angina pectoris: Secondary | ICD-10-CM | POA: Diagnosis not present

## 2018-12-30 DIAGNOSIS — D751 Secondary polycythemia: Secondary | ICD-10-CM

## 2018-12-30 DIAGNOSIS — M25522 Pain in left elbow: Secondary | ICD-10-CM | POA: Diagnosis not present

## 2018-12-30 DIAGNOSIS — M542 Cervicalgia: Secondary | ICD-10-CM | POA: Diagnosis not present

## 2018-12-30 DIAGNOSIS — F17200 Nicotine dependence, unspecified, uncomplicated: Secondary | ICD-10-CM

## 2018-12-30 MED ORDER — ATORVASTATIN CALCIUM 20 MG PO TABS: 20.0000 mg | ORAL_TABLET | Freq: Every day | ORAL | 3 refills | Status: DC

## 2018-12-30 MED ORDER — ATORVASTATIN CALCIUM 20 MG PO TABS: 20.0000 mg | ORAL_TABLET | Freq: Every day | ORAL | 0 refills | Status: DC

## 2018-12-30 NOTE — Patient Instructions (Signed)
Medication Instructions:  START Atorvastatin 20 mg once daily  *If you need a refill on your cardiac medications before your next appointment, please call your pharmacy*  Lab Work: Your provider would like for you to return in 3 months to have the following labs drawn: fasting Lipid. You do not need an appointment for the lab. Once in our office lobby there is a podium where you can sign in and ring the doorbell to alert [place] that you are here. The lab is open from 8:00 am to 4:30 pm; closed for lunch from 12:45pm-1:45pm.  If you have labs (blood work) drawn today and your tests are completely normal, you will receive your results only by:  [place] (if you have MyChart) OR  A paper copy in the mail If you have any lab test that is abnormal or we need to change your treatment, we will call you to review the results.  Testing/Procedures: Your physician has requested that you have an exercise tolerance test in 6 months. For further information please visit [email or web address]. Please also follow instruction sheet, as given. This will take place at [place], [address].  Do not drink or eat foods with caffeine for 24 hours before the test. (Chocolate, coffee, tea, or energy drinks)  If you use an inhaler, bring it with you to the test.  Do not smoke for 4 hours before the test.  Wear comfortable shoes and clothing.  Hold the Metoprolol the morning of and the night before the test   Follow-Up: At [facility], you and your health needs are our priority.  As part of our continuing mission to provide you with exceptional heart care, we have created designated Provider Care Teams.  These Care Teams include your primary Cardiologist (physician) and Advanced Practice Providers (APPs -  Physician Assistants and Nurse Practitioners) who all work together to provide you with the care you need, when you need it.  Your next appointment:   6 month(s)  The format for your next  appointment:   In Person  Provider:   Sanda Klein, MD  Other Instructions  Steps to Quit Smoking Smoking tobacco is the leading cause of preventable death. It can affect almost every organ in the body. Smoking puts you and people around you at risk for many serious, long-lasting (chronic) diseases. Quitting smoking can be hard, but it is one of the best things that you can do for your health. It is never too late to quit. How do I get ready to quit? When you decide to quit smoking, make a plan to help you succeed. Before you quit:  Pick a date to quit. Set a date within the next 2 weeks to give you time to prepare.  Write down the reasons why you are quitting. Keep this list in places where you will see it often.  Tell your family, friends, and co-workers that you are quitting. Their support is important.  Talk with your doctor about the choices that may help you quit.  Find out if your health insurance will pay for these treatments.  Know the people, places, things, and activities that make you want to smoke (triggers). Avoid them. What first steps can I take to quit smoking?  Throw away all cigarettes at home, at work, and in your car.  Throw away the things that you use when you smoke, such as ashtrays and lighters.  Clean your car. Make sure to empty the ashtray.  Clean your home,  including curtains and carpets. What can I do to help me quit smoking? Talk with your doctor about taking medicines and seeing a counselor at the same time. You are more likely to succeed when you do both.  If you are pregnant or breastfeeding, talk with your doctor about counseling or other ways to quit smoking. Do not take medicine to help you quit smoking unless your doctor tells you to do so. To quit smoking: Quit right away  Quit smoking totally, instead of slowly cutting back on how much you smoke over a period of time.  Go to counseling. You are more likely to quit if you go to  counseling sessions regularly. Take medicine You may take medicines to help you quit. Some medicines need a prescription, and some you can buy over-the-counter. Some medicines may contain a drug called nicotine to replace the nicotine in cigarettes. Medicines may:  Help you to stop having the desire to smoke (cravings).  Help to stop the problems that come when you stop smoking (withdrawal symptoms). Your doctor may ask you to use:  Nicotine patches, gum, or lozenges.  Nicotine inhalers or sprays.  Non-nicotine medicine that is taken by mouth. Find resources Find resources and other ways to help you quit smoking and remain smoke-free after you quit. These resources are most helpful when you use them often. They include:  Online chats with a Social worker.  Phone quitlines.  Printed Furniture conservator/restorer.  Support groups or group counseling.  Text messaging programs.  Mobile phone apps. Use apps on your mobile phone or tablet that can help you stick to your quit plan. There are many free apps for mobile phones and tablets as well as websites. Examples include Quit Guide from the [employer] and smokefree.gov  What things can I do to make it easier to quit?   Talk to your family and friends. Ask them to support and encourage you.  Call a phone quitline (1-800-QUIT-NOW), reach out to support groups, or work with a Social worker.  Ask people who smoke to not smoke around you.  Avoid places that make you want to smoke, such as: ? Bars. ? Parties. ? Smoke-break areas at work.  Spend time with people who do not smoke.  Lower the stress in your life. Stress can make you want to smoke. Try these things to help your stress: ? Getting regular exercise. ? Doing deep-breathing exercises. ? Doing yoga. ? Meditating. ? Doing a body scan. To do this, close your eyes, focus on one area of your body at a time from head to toe. Notice which parts of your body are tense. Try to relax the muscles in those  areas. How will I feel when I quit smoking? Day 1 to 3 weeks Within the first 24 hours, you may start to have some problems that come from quitting tobacco. These problems are very bad 2-3 days after you quit, but they do not often last for more than 2-3 weeks. You may get these symptoms:  Mood swings.  Feeling restless, nervous, angry, or annoyed.  Trouble concentrating.  Dizziness.  Strong desire for high-sugar foods and nicotine.  Weight gain.  Trouble pooping (constipation).  Feeling like you may vomit (nausea).  Coughing or a sore throat.  Changes in how the medicines that you take for other issues work in your body.  Depression.  Trouble sleeping (insomnia). Week 3 and afterward After the first 2-3 weeks of quitting, you may start to notice more positive results,  such as:  Better sense of smell and taste.  Less coughing and sore throat.  Slower heart rate.  Lower blood pressure.  Clearer skin.  Better breathing.  Fewer sick days. Quitting smoking can be hard. Do not give up if you fail the first time. Some people need to try a few times before they succeed. Do your best to stick to your quit plan, and talk with your doctor if you have any questions or concerns. Summary  Smoking tobacco is the leading cause of preventable death. Quitting smoking can be hard, but it is one of the best things that you can do for your health.  When you decide to quit smoking, make a plan to help you succeed.  Quit smoking right away, not slowly over a period of time.  When you start quitting, seek help from your doctor, family, or friends. This information is not intended to replace advice given to you by your health care provider. Make sure you discuss any questions you have with your health care provider. Document Released: 11/10/2008 Document Revised: 04/03/2018 Document Reviewed: 04/04/2018 Elsevier Patient Education  2020 [employer].

## 2018-12-30 NOTE — Progress Notes (Signed)
Cardiology Consultation Note:    Date:  12/30/2018   ID:  [name], DOB 1970/04/06, MRN [id number]  PCP:  Cassandria Anger, MD  Cardiologist:  No primary care provider on file.  Electrophysiologist:  None   Referring MD: Cassandria Anger, MD   Chief Complaint  Patient presents with  . New Patient (Initial Visit)  [name] is a 48 y.o. female who is being seen today for the evaluation of elevated calcium score at the request of Plotnikov, Evie Lacks, MD.   History of Present Illness:    [name] is a 48 y.o. female smoker with a hx of essential hypertension, symptomatic sinus tachycardia responsive to beta-blockers, possible polycythemia vera managed with phlebotomy, migraines, adult ADD, dyslipidemia and a strong family history of premature coronary artery disease.  She recently underwent a calcium score.  This showed a total score of 215 which is 99th percentile for her age/gender.  There was also evidence of atherosclerosis in the thoracic aorta.  She was a little worried a few weeks ago when she developed left-sided shoulder pain radiating down her arm, but this has now localized to the area of her elbow and is clearly positional.  It is not worsened by other physical activities, but she notices it more when she is lifting a mug or other small objects.  The patient specifically denies any chest pain at rest exertion, dyspnea at rest or with exertion, orthopnea, paroxysmal nocturnal dyspnea, syncope, palpitations, focal neurological deficits, intermittent claudication, lower extremity edema, unexplained weight gain, cough, hemoptysis or wheezing.  She smokes a pack of cigarettes a day, but is interested in quitting.  She is stop smoking before for 2 or 3 months at a time.  Her husband is also a smoker and wants to quit.  Both Yides's parents had diabetes mellitus.  Her father had early onset CAD, had bypass surgery at age 69 and died at age 67.  Both her  grandfathers died in their 69s.   Past Medical History:  Diagnosis Date  . Anxiety state, unspecified   . Arthritis   . Asthma   . Attention deficit disorder without mention of hyperactivity   . Bipolar disorder ([place])   . Constipation    takes Linzess  . Depressive disorder, not elsewhere classified   . Esophageal reflux   . Hypertension   . Migraine without aura, without mention of intractable migraine without mention of status migrainosus   . Pain in joint, site unspecified   . Pneumonia   . Polycythemia    per pt  . Unspecified asthma(493.90)   . Unspecified essential hypertension   . Unspecified vitamin D deficiency     Past Surgical History:  Procedure Laterality Date  . ABDOMINAL HYSTERECTOMY    . abdominal ultrasound  10/12/97  . APPENDECTOMY  05/27/2013   gangrenous  . INCISE AND DRAIN ABCESS  2011   Right axilla- Dr. Barkley Bruns  . KNEE ARTHROSCOPY     both knees  . KNEE ARTHROSCOPY WITH FULKERSON SLIDE Right 10/17/2015   Procedure: KNEE ARTHROSCOPY WITH Elmarie Mainland;  Surgeon: Melrose Nakayama, MD;  Location: [facility];  Service: Orthopedics;  Laterality: Right;  . NM MYOCAR PERF WALL MOTION  11/2009   bruce myoview; perfusion defect in anterior region consistent with breast attenuation; remaining myocardium with normal perfusion; post-stress EF 74%; low risk scan   . ORIF TIBIA FRACTURE Right 11/24/2015   Procedure: OPEN REDUCTION INTERNAL FIXATION (ORIF) TIBIA FRACTURE;  Surgeon:  Melrose Nakayama, MD;  Location: [facility];  Service: Orthopedics;  Laterality: Right;  . PARTIAL HYSTERECTOMY  2007  . PLANTAR FASCIA SURGERY  2000  . TONSILLECTOMY  04/29/00  . TONSILLECTOMY Bilateral   . TRANSTHORACIC ECHOCARDIOGRAM  11/2009   EF=>55%; trace MR & TR    Current Medications: Current Meds  Medication Sig  . albuterol (PROAIR HFA) 108 (90 Base) MCG/ACT inhaler Inhale 1-2 puffs into the lungs every 4 (four) hours as needed for wheezing or shortness of breath.  [name] albuterol  (PROVENTIL) (2.5 MG/3ML) 0.083% nebulizer solution Take 3 mLs (2.5 mg total) by nebulization every 6 (six) hours as needed. For shortness of breath.  . amphetamine-dextroamphetamine (ADDERALL) 10 MG tablet Take 10 mg by mouth daily.  . Asenapine Maleate (SAPHRIS) 10 MG SUBL Place under the tongue at bedtime.  [name] aspirin (BAYER ASPIRIN) 325 MG tablet Take 1 tablet (325 mg total) by mouth daily.  . budesonide-formoterol (SYMBICORT) 160-4.5 MCG/ACT inhaler INHALE 2 PUFFS BY MOUTH TWICE A DAY  . cyclobenzaprine (FLEXERIL) 10 MG tablet TAKE 1/2 - 1 TABLET BY MOUTH DAILY OR TWICE DAILY IF TOLERATED  . eletriptan (RELPAX) 40 MG tablet Take 1 tablet (40 mg total) by mouth as needed for migraine or headache. May repeat in 2 hours if headache persists or recurs.  . famotidine (PEPCID) 40 MG tablet Take 1 tablet (40 mg total) by mouth daily.  [name] gabapentin (NEURONTIN) 100 MG capsule TAKE 1 CAPSULE BY MOUTH 3  TIMES DAILY. KEEP SCHEDULED APPT FOR FUTURE REFILLS  . HYDROcodone-acetaminophen (NORCO/VICODIN) 5-325 MG tablet Take 1-2 tablets by mouth every 4 (four) hours as needed for moderate pain. (Patient taking differently: Take 1-2 tablets by mouth every 4 (four) hours as needed for moderate pain. 1/2-1 tablet every day PRN, Moderate pain)  . linaclotide (LINZESS) 145 MCG CAPS capsule Take 1-2 capsules (145-290 mcg total) by mouth daily as needed.  . meloxicam (MOBIC) 15 MG tablet TAKE 1 TABLET BY MOUTH  DAILY. KEEP SCHEDULED APPT  FOR FUTURE REFILLS  . metoprolol tartrate (LOPRESSOR) 100 MG tablet TAKE 1 TABLET BY MOUTH  TWICE DAILY  . nortriptyline (PAMELOR) 25 MG capsule TAKE 1 CAPSULE BY MOUTH AT  BEDTIME. KEEP SCHEDULED  APPT FOR FUTURE REFILLS  . pantoprazole (PROTONIX) 40 MG tablet Take 1 tablet (40 mg total) by mouth daily.  . traMADol (ULTRAM) 50 MG tablet Limit 1 tablet by mouth 4 - 8  times per day if tolerated  . zolpidem (AMBIEN) 5 MG tablet Take 5 mg by mouth daily.  . [DISCONTINUED] azithromycin  (ZITHROMAX Z-PAK) 250 MG tablet 2 tab by mouth day 1, then 1 per day  . [DISCONTINUED] ciclopirox (PENLAC) 8 % solution Apply topically at bedtime. Apply over nail and surrounding skin. Apply daily over previous coat. After seven (7) days, may remove with alcohol and continue cycle.  . [DISCONTINUED] lidocaine (XYLOCAINE) 5 % ointment Apply 1 application topically as needed. Use as directed to affected area three times daily as needed  . [DISCONTINUED] Lidocaine-Hydrocortisone Ace 3-0.5 % CREA Use as directed three times daily as needed  . [DISCONTINUED] triamcinolone ointment (KENALOG) 0.5 % Apply 1 application topically 4 (four) times daily.     Allergies:   Lisinopril, Nizoral [ketoconazole], Amoxicillin, Ancef [cefazolin], Doxycycline, Hydrochlorothiazide, Penicillins, Percocet [oxycodone-acetaminophen], and Talwin [pentazocine]   Social History   Socioeconomic History  . Marital status: Married    Spouse name: Not on file  . Number of children: 2  . Years of education:  Not on file  . Highest education level: Not on file  Occupational History  . Occupation: HR; waiting table 2 d/wk; back to school; filed for disability; lost her job 2011     Employer: [employer]  . Financial resource strain: Not on file  . Food insecurity    Worry: Not on file    Inability: Not on file  . Transportation needs    Medical: Not on file    Non-medical: Not on file  Tobacco Use  . Smoking status: Current Every Day Smoker    Packs/day: 1.00    Years: 27.00    Pack years: 27.00    Types: Cigarettes    Start date: 10/31/1986    Last attempt to quit: 11/22/2014    Years since quitting: 4.1  . Smokeless tobacco: Never Used  . Tobacco comment: trying to quit  Substance and Sexual Activity  . Alcohol use: No    Alcohol/week: 0.0 standard drinks  . Drug use: No  . Sexual activity: Yes    Birth control/protection: None  Lifestyle  . Physical activity    Days per week: Not on file     Minutes per session: Not on file  . Stress: Not on file  Relationships  . Social Herbalist on phone: Not on file    Gets together: Not on file    Attends religious service: Not on file    Active member of club or organization: Not on file    Attends meetings of clubs or organizations: Not on file    Relationship status: Not on file  Other Topics Concern  . Not on file  Social History Narrative   Patient lives at home with her mother.   Disabled   Right handed   Caffeine three cups daily   Some college education     Family History: The patient's family history includes ADD / ADHD in her child; Alcohol abuse in her brother and sister; Asthma in her child and another family member; Bipolar disorder in her sister; Breast cancer in her mother; COPD in her paternal grandmother; Cancer (age of onset: 81) in her mother; Coronary artery disease in an other family member; Depression in her mother; Diabetes in her paternal grandfather and another family member; Heart disease in her father, maternal grandfather, and paternal grandfather; Lupus in her sister; Multiple sclerosis in her mother.  ROS:   Please see the history of present illness.     All other systems reviewed and are negative.  EKGs/Labs/Other Studies Reviewed:    The following studies were reviewed today: Calcium score CT scan 11/02/2018 FINDINGS: Non-cardiac: See separate report from [facility].  Ascending Aorta: Normal caliber with trace aortic root calcification.  Pericardium: Normal.  Coronary arteries: Normal origins.  IMPRESSION: Coronary calcium score of 215. This was 99th percentile for age and sex matched control. Echocardiogram 2015 (normal) Left ventricle: The cavity size was normal. Systolic  function was normal. The estimated ejection fraction was in  the range of 60% to 65%. Wall motion was normal; there were  no regional wall motion abnormalities. Left ventricular  diastolic  function parameters were normal.   EKG:  EKG is  ordered today.  The ekg ordered today demonstrates normal sinus rhythm, normal tracing, questionable left atrial abnormality  Recent Labs: 02/26/2018: ALT 16; BUN 10; Creatinine, Ser 0.66; Potassium 4.4; Sodium 138; TSH 1.50 08/11/2018: Hemoglobin 15.1; Platelets 213.0  Recent Lipid Panel  Component Value Date/Time   CHOL 163 02/26/2018 0816   TRIG 209.0 (H) 02/26/2018 0816   HDL 26.70 (L) 02/26/2018 0816   CHOLHDL 6 02/26/2018 0816   VLDL 41.8 (H) 02/26/2018 0816   LDLCALC 110 (H) 01/30/2017 0846   LDLDIRECT 121.0 02/26/2018 0816    Physical Exam:    VS:  BP 104/78 (BP Location: Left Arm, Patient Position: Sitting, Cuff Size: Normal)   Pulse 71   Temp (!) 97.3 F (36.3 C)   Ht 5\' 6"  (1.676 m)   Wt 197 lb (89.4 kg)   BMI 31.80 kg/m     Wt Readings from Last 3 Encounters:  12/30/18 197 lb (89.4 kg)  09/17/18 201 lb (91.2 kg)  08/27/18 202 lb (91.6 kg)     GEN: Mildly obese, well nourished, well developed in no acute distress HEENT: Normal NECK: No JVD; No carotid bruits LYMPHATICS: No lymphadenopathy CARDIAC: RRR, no murmurs, rubs, gallops RESPIRATORY:  Clear to auscultation without rales, wheezing or rhonchi  ABDOMEN: Soft, non-tender, non-distended MUSCULOSKELETAL:  No edema; No deformity  SKIN: Warm and dry NEUROLOGIC:  Alert and oriented x 3 PSYCHIATRIC:  Normal affect   ASSESSMENT:    1. Mixed hyperlipidemia   2. Coronary artery disease involving native coronary artery of native heart without angina pectoris   3. Essential hypertension   4. Polycythemia   5. Smoking    PLAN:    In order of problems listed above:  1. CAD: She does not have any symptoms of coronary insufficiency, but has very extensive coronary atherosclerosis as measured by her calcium score.  I would recommend aggressive risk factor modification.  Target LDL less than 70.  Sustained efforts at smoking cessation.  Exercise and healthy diet  discussed, needs to avoid gaining weight since she is approaching "prediabetes".  Since she is asymptomatic while being physically active, we decided to delay a functional study (treadmill ECG stress test) until after the coronavirus pandemic restrictions have loosened.  She takes aspirin 325 mg as indicated for polycythemia, not just primary prevention of CAD. 2. HLP: She has atypical metabolic syndrome/insulin resistance lipid profile, at high risk for development of progression of vascular disease.  Very low HDL, elevated triglycerides, likely to have small dense LDL with high particle number.  Treat with statin to achieve LDL less than 70.  Discussed weight loss and physical exercise as ways to improve the HDL and lessen insulin resistance, thereby reducing the risk of future problems with diabetes. 3. HTN: Well-controlled.  She is doing great with beta-blockers which she tolerates much better than diuretics and vasodilators which were associated with symptomatic sinus tachycardia. 4. P.vera: Managed with intermittent phlebotomy.  Aspirin 325 mg once daily.  Reports having had a home sleep study that was negative.  Does not appear to have sufficient lung problems or smoke enough to cause a secondary polycythemia. 5. Smoking cessation: Discussed ways to achieve and maintain smoking cessation in detail including nicotine supplementation, behavioral/lifestyle changes, etc.   Medication Adjustments/Labs and Tests Ordered: Current medicines are reviewed at length with the patient today.  Concerns regarding medicines are outlined above.  Orders Placed This Encounter  Procedures  . Lipid panel  . Exercise Tolerance Test  . EKG 12-Lead   Meds ordered this encounter  Medications  . DISCONTD: atorvastatin (LIPITOR) 20 MG tablet    Sig: Take 1 tablet (20 mg total) by mouth daily.    Dispense:  90 tablet    Refill:  3  .  atorvastatin (LIPITOR) 20 MG tablet    Sig: Take 1 tablet (20 mg total) by mouth  daily.    Dispense:  30 tablet    Refill:  0    Patient Instructions  Medication Instructions:  START Atorvastatin 20 mg once daily  *If you need a refill on your cardiac medications before your next appointment, please call your pharmacy*  Lab Work: Your provider would like for you to return in 3 months to have the following labs drawn: fasting Lipid. You do not need an appointment for the lab. Once in our office lobby there is a podium where you can sign in and ring the doorbell to alert [place] that you are here. The lab is open from 8:00 am to 4:30 pm; closed for lunch from 12:45pm-1:45pm.  If you have labs (blood work) drawn today and your tests are completely normal, you will receive your results only by: [name] MyChart Message (if you have MyChart) OR . A paper copy in the mail If you have any lab test that is abnormal or we need to change your treatment, we will call you to review the results.  Testing/Procedures: Your physician has requested that you have an exercise tolerance test in 6 months. For further information please visit [email or web address]. Please also follow instruction sheet, as given. This will take place at [place], [address].  Do not drink or eat foods with caffeine for 24 hours before the test. (Chocolate, coffee, tea, or energy drinks)  If you use an inhaler, bring it with you to the test.  Do not smoke for 4 hours before the test.  Wear comfortable shoes and clothing.  Hold the Metoprolol the morning of and the night before the test   Follow-Up: At [facility], you and your health needs are our priority.  As part of our continuing mission to provide you with exceptional heart care, we have created designated Provider Care Teams.  These Care Teams include your primary Cardiologist (physician) and Advanced Practice Providers (APPs -  Physician Assistants and Nurse Practitioners) who all work together to provide you with the care you need, when you need  it.  Your next appointment:   6 month(s)  The format for your next appointment:   In Person  Provider:   Sanda Klein, MD  Other Instructions  Steps to Quit Smoking Smoking tobacco is the leading cause of preventable death. It can affect almost every organ in the body. Smoking puts you and people around you at risk for many serious, long-lasting (chronic) diseases. Quitting smoking can be hard, but it is one of the best things that you can do for your health. It is never too late to quit. How do I get ready to quit? When you decide to quit smoking, make a plan to help you succeed. Before you quit:  Pick a date to quit. Set a date within the next 2 weeks to give you time to prepare.  Write down the reasons why you are quitting. Keep this list in places where you will see it often.  Tell your family, friends, and co-workers that you are quitting. Their support is important.  Talk with your doctor about the choices that may help you quit.  Find out if your health insurance will pay for these treatments.  Know the people, places, things, and activities that make you want to smoke (triggers). Avoid them. What first steps can I take to quit smoking?  Throw away all cigarettes  at home, at work, and in your car.  Throw away the things that you use when you smoke, such as ashtrays and lighters.  Clean your car. Make sure to empty the ashtray.  Clean your home, including curtains and carpets. What can I do to help me quit smoking? Talk with your doctor about taking medicines and seeing a counselor at the same time. You are more likely to succeed when you do both.  If you are pregnant or breastfeeding, talk with your doctor about counseling or other ways to quit smoking. Do not take medicine to help you quit smoking unless your doctor tells you to do so. To quit smoking: Quit right away  Quit smoking totally, instead of slowly cutting back on how much you smoke over a period of  time.  Go to counseling. You are more likely to quit if you go to counseling sessions regularly. Take medicine You may take medicines to help you quit. Some medicines need a prescription, and some you can buy over-the-counter. Some medicines may contain a drug called nicotine to replace the nicotine in cigarettes. Medicines may:  Help you to stop having the desire to smoke (cravings).  Help to stop the problems that come when you stop smoking (withdrawal symptoms). Your doctor may ask you to use:  Nicotine patches, gum, or lozenges.  Nicotine inhalers or sprays.  Non-nicotine medicine that is taken by mouth. Find resources Find resources and other ways to help you quit smoking and remain smoke-free after you quit. These resources are most helpful when you use them often. They include:  Online chats with a Social worker.  Phone quitlines.  Printed Furniture conservator/restorer.  Support groups or group counseling.  Text messaging programs.  Mobile phone apps. Use apps on your mobile phone or tablet that can help you stick to your quit plan. There are many free apps for mobile phones and tablets as well as websites. Examples include Quit Guide from the [employer] and smokefree.gov  What things can I do to make it easier to quit?   Talk to your family and friends. Ask them to support and encourage you.  Call a phone quitline (1-800-QUIT-NOW), reach out to support groups, or work with a Social worker.  Ask people who smoke to not smoke around you.  Avoid places that make you want to smoke, such as: ? Bars. ? Parties. ? Smoke-break areas at work.  Spend time with people who do not smoke.  Lower the stress in your life. Stress can make you want to smoke. Try these things to help your stress: ? Getting regular exercise. ? Doing deep-breathing exercises. ? Doing yoga. ? Meditating. ? Doing a body scan. To do this, close your eyes, focus on one area of your body at a time from head to toe. Notice  which parts of your body are tense. Try to relax the muscles in those areas. How will I feel when I quit smoking? Day 1 to 3 weeks Within the first 24 hours, you may start to have some problems that come from quitting tobacco. These problems are very bad 2-3 days after you quit, but they do not often last for more than 2-3 weeks. You may get these symptoms:  Mood swings.  Feeling restless, nervous, angry, or annoyed.  Trouble concentrating.  Dizziness.  Strong desire for high-sugar foods and nicotine.  Weight gain.  Trouble pooping (constipation).  Feeling like you may vomit (nausea).  Coughing or a sore throat.  Changes in  how the medicines that you take for other issues work in your body.  Depression.  Trouble sleeping (insomnia). Week 3 and afterward After the first 2-3 weeks of quitting, you may start to notice more positive results, such as:  Better sense of smell and taste.  Less coughing and sore throat.  Slower heart rate.  Lower blood pressure.  Clearer skin.  Better breathing.  Fewer sick days. Quitting smoking can be hard. Do not give up if you fail the first time. Some people need to try a few times before they succeed. Do your best to stick to your quit plan, and talk with your doctor if you have any questions or concerns. Summary  Smoking tobacco is the leading cause of preventable death. Quitting smoking can be hard, but it is one of the best things that you can do for your health.  When you decide to quit smoking, make a plan to help you succeed.  Quit smoking right away, not slowly over a period of time.  When you start quitting, seek help from your doctor, family, or friends. This information is not intended to replace advice given to you by your health care provider. Make sure you discuss any questions you have with your health care provider. Document Released: 11/10/2008 Document Revised: 04/03/2018 Document Reviewed: 04/04/2018 Elsevier  Patient Education  2020 Ericson, Sanda Klein, MD  12/30/2018 1:13 PM    [place]

## 2019-01-13 ENCOUNTER — Other Ambulatory Visit: Payer: Self-pay | Admitting: Internal Medicine

## 2019-01-13 DIAGNOSIS — M5134 Other intervertebral disc degeneration, thoracic region: Secondary | ICD-10-CM | POA: Diagnosis not present

## 2019-01-13 DIAGNOSIS — M5136 Other intervertebral disc degeneration, lumbar region: Secondary | ICD-10-CM | POA: Diagnosis not present

## 2019-01-13 DIAGNOSIS — M545 Low back pain: Secondary | ICD-10-CM | POA: Diagnosis not present

## 2019-01-13 DIAGNOSIS — G894 Chronic pain syndrome: Secondary | ICD-10-CM | POA: Diagnosis not present

## 2019-01-13 DIAGNOSIS — M179 Osteoarthritis of knee, unspecified: Secondary | ICD-10-CM | POA: Diagnosis not present

## 2019-01-13 MED ORDER — GABAPENTIN 300 MG PO CAPS: 300.0000 mg | ORAL_CAPSULE | Freq: Three times a day (TID) | ORAL | 1 refills | Status: DC

## 2019-01-21 ENCOUNTER — Other Ambulatory Visit: Payer: Self-pay | Admitting: Cardiovascular Disease

## 2019-01-31 ENCOUNTER — Other Ambulatory Visit: Payer: Self-pay | Admitting: Internal Medicine

## 2019-02-10 ENCOUNTER — Other Ambulatory Visit (INDEPENDENT_AMBULATORY_CARE_PROVIDER_SITE_OTHER): Payer: Medicare Other

## 2019-02-10 ENCOUNTER — Other Ambulatory Visit: Payer: Medicare Other

## 2019-02-10 DIAGNOSIS — R739 Hyperglycemia, unspecified: Secondary | ICD-10-CM | POA: Diagnosis not present

## 2019-02-10 DIAGNOSIS — D751 Secondary polycythemia: Secondary | ICD-10-CM

## 2019-02-10 DIAGNOSIS — M546 Pain in thoracic spine: Secondary | ICD-10-CM | POA: Diagnosis not present

## 2019-02-10 DIAGNOSIS — M7062 Trochanteric bursitis, left hip: Secondary | ICD-10-CM | POA: Diagnosis not present

## 2019-02-10 DIAGNOSIS — M7061 Trochanteric bursitis, right hip: Secondary | ICD-10-CM | POA: Diagnosis not present

## 2019-02-10 DIAGNOSIS — Z5181 Encounter for therapeutic drug level monitoring: Secondary | ICD-10-CM | POA: Diagnosis not present

## 2019-02-10 DIAGNOSIS — M5136 Other intervertebral disc degeneration, lumbar region: Secondary | ICD-10-CM | POA: Diagnosis not present

## 2019-02-10 DIAGNOSIS — G894 Chronic pain syndrome: Secondary | ICD-10-CM | POA: Diagnosis not present

## 2019-02-10 DIAGNOSIS — M545 Low back pain: Secondary | ICD-10-CM | POA: Diagnosis not present

## 2019-02-10 LAB — CBC WITH DIFFERENTIAL/PLATELET
Basophils Absolute: 0.1 10*3/uL (ref 0.0–0.1)
Basophils Relative: 1 % (ref 0.0–3.0)
Eosinophils Absolute: 0.2 10*3/uL (ref 0.0–0.7)
Eosinophils Relative: 3 % (ref 0.0–5.0)
HCT: 44.1 % (ref 36.0–46.0)
Hemoglobin: 15.2 g/dL — ABNORMAL HIGH (ref 12.0–15.0)
Lymphocytes Relative: 45.9 % (ref 12.0–46.0)
Lymphs Abs: 3 10*3/uL (ref 0.7–4.0)
MCHC: 34.5 g/dL (ref 30.0–36.0)
MCV: 90.6 fl (ref 78.0–100.0)
Monocytes Absolute: 0.8 10*3/uL (ref 0.1–1.0)
Monocytes Relative: 11.7 % (ref 3.0–12.0)
Neutro Abs: 2.5 10*3/uL (ref 1.4–7.7)
Neutrophils Relative %: 38.4 % — ABNORMAL LOW (ref 43.0–77.0)
Platelets: 201 10*3/uL (ref 150.0–400.0)
RBC: 4.86 Mil/uL (ref 3.87–5.11)
RDW: 12.5 % (ref 11.5–15.5)
WBC: 6.6 10*3/uL (ref 4.0–10.5)

## 2019-02-10 LAB — BASIC METABOLIC PANEL
BUN: 14 mg/dL (ref 6–23)
CO2: 26 mEq/L (ref 19–32)
Calcium: 9.4 mg/dL (ref 8.4–10.5)
Chloride: 104 mEq/L (ref 96–112)
Creatinine, Ser: 0.66 mg/dL (ref 0.40–1.20)
GFR: 96.33 mL/min (ref >60.00)
Glucose, Bld: 104 mg/dL — ABNORMAL HIGH (ref 70–99)
Potassium: 4.6 mEq/L (ref 3.5–5.1)
Sodium: 137 mEq/L (ref 135–145)

## 2019-02-10 LAB — HEMOGLOBIN A1C: Hgb A1c MFr Bld: 5.6 % (ref 4.6–6.5)

## 2019-03-02 ENCOUNTER — Encounter: Payer: Self-pay | Admitting: Internal Medicine

## 2019-03-02 ENCOUNTER — Ambulatory Visit (INDEPENDENT_AMBULATORY_CARE_PROVIDER_SITE_OTHER): Payer: Medicare Other | Admitting: Internal Medicine

## 2019-03-02 ENCOUNTER — Other Ambulatory Visit: Payer: Self-pay

## 2019-03-02 DIAGNOSIS — I471 Supraventricular tachycardia: Secondary | ICD-10-CM

## 2019-03-02 DIAGNOSIS — I2510 Atherosclerotic heart disease of native coronary artery without angina pectoris: Secondary | ICD-10-CM | POA: Diagnosis not present

## 2019-03-02 DIAGNOSIS — D751 Secondary polycythemia: Secondary | ICD-10-CM

## 2019-03-02 DIAGNOSIS — I2583 Coronary atherosclerosis due to lipid rich plaque: Secondary | ICD-10-CM

## 2019-03-02 DIAGNOSIS — F902 Attention-deficit hyperactivity disorder, combined type: Secondary | ICD-10-CM | POA: Diagnosis not present

## 2019-03-02 DIAGNOSIS — J4520 Mild intermittent asthma, uncomplicated: Secondary | ICD-10-CM | POA: Diagnosis not present

## 2019-03-02 NOTE — Assessment & Plan Note (Addendum)
Coronary calcium score of 215. Needs to quit smoking Lipitor, ASA F/u w/Dr Croitoru

## 2019-03-02 NOTE — Progress Notes (Signed)
Subjective:  Patient ID: [name], female    DOB: 19-May-1970  Age: 49 y.o. MRN: [id number]  CC: No chief complaint on file.   HPI [name] presents for ADD, polycythemia, CAD f/u Husband had a motorcycle acciden  Outpatient Medications Prior to Visit  Medication Sig Dispense Refill  . albuterol (PROAIR HFA) 108 (90 Base) MCG/ACT inhaler Inhale 1-2 puffs into the lungs every 4 (four) hours as needed for wheezing or shortness of breath. 8.5 Inhaler 5  . albuterol (PROVENTIL) (2.5 MG/3ML) 0.083% nebulizer solution Take 3 mLs (2.5 mg total) by nebulization every 6 (six) hours as needed. For shortness of breath. 75 mL 0  . amphetamine-dextroamphetamine (ADDERALL) 10 MG tablet Take 10 mg by mouth daily.  0  . Asenapine Maleate (SAPHRIS) 10 MG SUBL Place under the tongue at bedtime.    [name] aspirin (BAYER ASPIRIN) 325 MG tablet Take 1 tablet (325 mg total) by mouth daily. 100 tablet 3  . atorvastatin (LIPITOR) 20 MG tablet TAKE 1 TABLET BY MOUTH EVERY DAY 30 tablet 0  . budesonide-formoterol (SYMBICORT) 160-4.5 MCG/ACT inhaler INHALE 2 PUFFS BY MOUTH TWICE A DAY 10.2 Inhaler 5  . cyclobenzaprine (FLEXERIL) 10 MG tablet TAKE 1/2 - 1 TABLET BY MOUTH DAILY OR TWICE DAILY IF TOLERATED  0  . eletriptan (RELPAX) 40 MG tablet Take 1 tablet (40 mg total) by mouth as needed for migraine or headache. May repeat in 2 hours if headache persists or recurs. 15 tablet 6  . famotidine (PEPCID) 40 MG tablet Take 1 tablet (40 mg total) by mouth daily. 90 tablet 3  . gabapentin (NEURONTIN) 300 MG capsule Take 1 capsule (300 mg total) by mouth 3 (three) times daily. 270 capsule 1  . HYDROcodone-acetaminophen (NORCO/VICODIN) 5-325 MG tablet Take 1-2 tablets by mouth every 4 (four) hours as needed for moderate pain. (Patient taking differently: Take 1-2 tablets by mouth every 4 (four) hours as needed for moderate pain. 1/2-1 tablet every day PRN, Moderate pain) 30 tablet 0  . linaclotide (LINZESS) 145 MCG  CAPS capsule Take 1-2 capsules (145-290 mcg total) by mouth daily as needed. 180 capsule 3  . meloxicam (MOBIC) 15 MG tablet TAKE 1 TABLET BY MOUTH  DAILY. KEEP SCHEDULED APPT  FOR FUTURE REFILLS 90 tablet 3  . metoprolol tartrate (LOPRESSOR) 100 MG tablet TAKE 1 TABLET BY MOUTH  TWICE DAILY 180 tablet 3  . nortriptyline (PAMELOR) 25 MG capsule TAKE 1 CAPSULE BY MOUTH AT  BEDTIME. KEEP SCHEDULED  APPT FOR FUTURE REFILLS 90 capsule 3  . pantoprazole (PROTONIX) 40 MG tablet TAKE 1 TABLET BY MOUTH  DAILY 90 tablet 0  . traMADol (ULTRAM) 50 MG tablet Limit 1 tablet by mouth 4 - 8  times per day if tolerated 240 tablet 0  . traZODone (DESYREL) 100 MG tablet Take 100 mg by mouth at bedtime.    [name] zolpidem (AMBIEN) 5 MG tablet Take 5 mg by mouth daily.  1   No facility-administered medications prior to visit.    ROS: Review of Systems  Constitutional: Negative for activity change, appetite change, chills, fatigue and unexpected weight change.  HENT: Negative for congestion, mouth sores and sinus pressure.   Eyes: Negative for visual disturbance.  Respiratory: Negative for cough and chest tightness.   Gastrointestinal: Negative for abdominal pain and nausea.  Genitourinary: Negative for difficulty urinating, frequency and vaginal pain.  Musculoskeletal: Positive for back pain. Negative for gait problem.  Skin: Negative for pallor and rash.  Neurological: Negative for dizziness, tremors, weakness, numbness and headaches.  Psychiatric/Behavioral: Negative for confusion and sleep disturbance.    Objective:  BP 104/76 (BP Location: Left Arm, Patient Position: Sitting, Cuff Size: Large)   Pulse 67   Temp 97.8 F (36.6 C) (Oral)   Ht 5\' 6"  (1.676 m)   Wt 198 lb (89.8 kg)   SpO2 93%   BMI 31.96 kg/m   BP Readings from Last 3 Encounters:  03/02/19 104/76  12/30/18 104/78  09/17/18 110/84    Wt Readings from Last 3 Encounters:  03/02/19 198 lb (89.8 kg)  12/30/18 197 lb (89.4 kg)    09/17/18 201 lb (91.2 kg)    Physical Exam Constitutional:      General: She is not in acute distress.    Appearance: She is well-developed. She is obese.  HENT:     Head: Normocephalic.     Right Ear: External ear normal.     Left Ear: External ear normal.     Nose: Nose normal.  Eyes:     General:        Right eye: No discharge.        Left eye: No discharge.     Conjunctiva/sclera: Conjunctivae normal.     Pupils: Pupils are equal, round, and reactive to light.  Neck:     Thyroid: No thyromegaly.     Vascular: No JVD.     Trachea: No tracheal deviation.  Cardiovascular:     Rate and Rhythm: Normal rate and regular rhythm.     Heart sounds: Normal heart sounds.  Pulmonary:     Effort: No respiratory distress.     Breath sounds: No stridor. No wheezing.  Abdominal:     General: Bowel sounds are normal. There is no distension.     Palpations: Abdomen is soft. There is no mass.     Tenderness: There is no abdominal tenderness. There is no guarding or rebound.  Musculoskeletal:        General: No tenderness.     Cervical back: Normal range of motion and neck supple.  Lymphadenopathy:     Cervical: No cervical adenopathy.  Skin:    Findings: No erythema or rash.  Neurological:     Cranial Nerves: No cranial nerve deficit.     Motor: No abnormal muscle tone.     Coordination: Coordination normal.     Deep Tendon Reflexes: Reflexes normal.  Psychiatric:        Behavior: Behavior normal.        Thought Content: Thought content normal.        Judgment: Judgment normal.     Lab Results  Component Value Date   WBC 6.6 02/10/2019   HGB 15.2 (H) 02/10/2019   HCT 44.1 02/10/2019   PLT 201.0 02/10/2019   GLUCOSE 104 (H) 02/10/2019   CHOL 163 02/26/2018   TRIG 209.0 (H) 02/26/2018   HDL 26.70 (L) 02/26/2018   LDLDIRECT 121.0 02/26/2018   LDLCALC 110 (H) 01/30/2017   ALT 16 02/26/2018   AST 12 02/26/2018   NA 137 02/10/2019   K 4.6 02/10/2019   CL 104 02/10/2019    CREATININE 0.66 02/10/2019   BUN 14 02/10/2019   CO2 26 02/10/2019   TSH 1.50 02/26/2018   INR 1.05 08/29/2014   HGBA1C 5.6 02/10/2019    CT CARDIAC SCORING  Addendum Date: 11/02/2018   ADDENDUM REPORT: 11/02/2018 12:41 EXAM: OVER-READ INTERPRETATION  CT CHEST The following report is an over-read  performed by radiologist Dr. Norlene Duel [facility] on 11/02/2018. This over-read does not include interpretation of cardiac or coronary anatomy or pathology. The coronary calcium score interpretation by the cardiologist is attached. COMPARISON:  None. FINDINGS: Cardiovascular: Normal heart size. No pericardial effusion. Mild aortic atherosclerotic calcification. Mediastinum: No mass or adenopathy identified. Lungs/pleura: No pleural effusion or pneumothorax.  Lungs are clear. Upper abdomen: No acute findings identified within the imaged portion of the upper abdomen. Musculoskeletal: Spondylosis noted no acute or significant osseous findings. IMPRESSION: 1. Negative over-read. Electronically Signed   By: Kerby Moors M.D.   On: 11/02/2018 12:41   Result Date: 11/02/2018 CLINICAL DATA:  Risk stratification EXAM: Coronary Calcium Score TECHNIQUE: The patient was scanned on a [employer]. Axial non-contrast 3 mm slices were carried out through the heart. The data set was analyzed on a dedicated work station and scored using the [place]. FINDINGS: Non-cardiac: See separate report from [facility]. Ascending Aorta: Normal caliber with trace aortic root calcification. Pericardium: Normal. Coronary arteries: Normal origins. IMPRESSION: Coronary calcium score of 215. This was 99th percentile for age and sex matched control. Eleonore Chiquito, MD Electronically Signed: By: Eleonore Chiquito On: 11/02/2018 08:58    Assessment & Plan:   There are no diagnoses linked to this encounter.   No orders of the defined types were placed in this encounter.    Follow-up: No  follow-ups on file.  Walker Kehr, MD

## 2019-03-02 NOTE — Patient Instructions (Signed)

## 2019-03-02 NOTE — Assessment & Plan Note (Signed)
Needs to quit smoking 

## 2019-03-02 NOTE — Assessment & Plan Note (Signed)
[place] 

## 2019-03-02 NOTE — Assessment & Plan Note (Signed)
Adderall

## 2019-03-10 DIAGNOSIS — M545 Low back pain: Secondary | ICD-10-CM | POA: Diagnosis not present

## 2019-03-10 DIAGNOSIS — M5136 Other intervertebral disc degeneration, lumbar region: Secondary | ICD-10-CM | POA: Diagnosis not present

## 2019-03-10 DIAGNOSIS — G894 Chronic pain syndrome: Secondary | ICD-10-CM | POA: Diagnosis not present

## 2019-03-10 DIAGNOSIS — M546 Pain in thoracic spine: Secondary | ICD-10-CM | POA: Diagnosis not present

## 2019-04-07 DIAGNOSIS — G894 Chronic pain syndrome: Secondary | ICD-10-CM | POA: Diagnosis not present

## 2019-04-07 DIAGNOSIS — M545 Low back pain: Secondary | ICD-10-CM | POA: Diagnosis not present

## 2019-04-22 ENCOUNTER — Other Ambulatory Visit: Payer: Self-pay | Admitting: Internal Medicine

## 2019-04-23 ENCOUNTER — Encounter: Payer: Self-pay | Admitting: Cardiovascular Disease

## 2019-04-23 ENCOUNTER — Telehealth: Payer: Self-pay | Admitting: Cardiovascular Disease

## 2019-04-23 NOTE — Telephone Encounter (Signed)
Spoke with patient regarding appointments for ETT and COVID testing ordered by Dr. Marlan Palau scheduled Wednesday 07/07/19 at 9:45 am and COVID testing scheduled Saturday 07/03/19 at 12:05pm. Patient voiced her understanding and I will mail informaton to patient.

## 2019-04-29 ENCOUNTER — Ambulatory Visit: Payer: Medicare Other | Attending: Internal Medicine

## 2019-04-29 DIAGNOSIS — Z23 Encounter for immunization: Secondary | ICD-10-CM

## 2019-04-29 NOTE — Progress Notes (Signed)
   Covid-19 Vaccination Clinic  Name:  [name]    MRN: [id number] DOB: 1970-05-17  04/29/2019  Ms. Hayner was observed post Covid-19 immunization for 15 minutes without incident. She was provided with Vaccine Information Sheet and instruction to access the V-Safe system.   Ms. Gregor was instructed to call 911 with any severe reactions post vaccine: [name] Difficulty breathing  . Swelling of face and throat  . A fast heartbeat  . A bad rash all over body  . Dizziness and weakness   Immunizations Administered    Name Date Dose VIS Date Route   Pfizer COVID-19 Vaccine 04/29/2019  3:52 PM 0.3 mL 01/08/2019 Intramuscular   Manufacturer: [employer], [employer]   Lot: [id number]   [place]: [id number]

## 2019-05-05 DIAGNOSIS — M222X2 Patellofemoral disorders, left knee: Secondary | ICD-10-CM | POA: Diagnosis not present

## 2019-05-05 DIAGNOSIS — M222X1 Patellofemoral disorders, right knee: Secondary | ICD-10-CM | POA: Diagnosis not present

## 2019-05-10 DIAGNOSIS — I2510 Atherosclerotic heart disease of native coronary artery without angina pectoris: Secondary | ICD-10-CM | POA: Diagnosis not present

## 2019-05-10 DIAGNOSIS — E785 Hyperlipidemia, unspecified: Secondary | ICD-10-CM | POA: Diagnosis not present

## 2019-05-11 DIAGNOSIS — M179 Osteoarthritis of knee, unspecified: Secondary | ICD-10-CM | POA: Diagnosis not present

## 2019-05-11 DIAGNOSIS — G894 Chronic pain syndrome: Secondary | ICD-10-CM | POA: Diagnosis not present

## 2019-05-11 DIAGNOSIS — M545 Low back pain: Secondary | ICD-10-CM | POA: Diagnosis not present

## 2019-05-11 DIAGNOSIS — M5136 Other intervertebral disc degeneration, lumbar region: Secondary | ICD-10-CM | POA: Diagnosis not present

## 2019-05-11 LAB — LIPID PANEL
Chol/HDL Ratio: 4.6 ratio — ABNORMAL HIGH (ref 0.0–4.4)
Cholesterol, Total: 97 mg/dL — ABNORMAL LOW (ref 100–199)
HDL: 21 mg/dL — ABNORMAL LOW (ref >39)
LDL Chol Calc (NIH): 46 mg/dL (ref 0–99)
Triglycerides: 177 mg/dL — ABNORMAL HIGH (ref 0–149)
VLDL Cholesterol Cal: 30 mg/dL (ref 5–40)

## 2019-05-17 ENCOUNTER — Encounter: Payer: Self-pay | Admitting: *Deleted

## 2019-05-24 ENCOUNTER — Ambulatory Visit: Payer: Self-pay

## 2019-05-25 ENCOUNTER — Ambulatory Visit: Payer: Medicare Other | Attending: Internal Medicine

## 2019-05-25 DIAGNOSIS — Z23 Encounter for immunization: Secondary | ICD-10-CM

## 2019-05-25 NOTE — Progress Notes (Signed)
   Covid-19 Vaccination Clinic  Name:  BARB PATRIDGE    MRN: [id number] DOB: 1970-12-26  05/25/2019  Ms. Corlett was observed post Covid-19 immunization for 15 minutes without incident. She was provided with Vaccine Information Sheet and instruction to access the V-Safe system.   Ms. Odoherty was instructed to call 911 with any severe reactions post vaccine: [name] Difficulty breathing  . Swelling of face and throat  . A fast heartbeat  . A bad rash all over body  . Dizziness and weakness   Immunizations Administered    Name Date Dose VIS Date Route   Pfizer COVID-19 Vaccine 05/25/2019 10:06 AM 0.3 mL 03/24/2018 Intramuscular   Manufacturer: [place]   Lot: [id number]   [place]: [id number]

## 2019-06-07 DIAGNOSIS — M25562 Pain in left knee: Secondary | ICD-10-CM | POA: Diagnosis not present

## 2019-06-07 DIAGNOSIS — M25561 Pain in right knee: Secondary | ICD-10-CM | POA: Diagnosis not present

## 2019-06-08 DIAGNOSIS — M47897 Other spondylosis, lumbosacral region: Secondary | ICD-10-CM | POA: Diagnosis not present

## 2019-06-08 DIAGNOSIS — R519 Headache, unspecified: Secondary | ICD-10-CM | POA: Diagnosis not present

## 2019-06-08 DIAGNOSIS — M79669 Pain in unspecified lower leg: Secondary | ICD-10-CM | POA: Diagnosis not present

## 2019-06-08 DIAGNOSIS — M5136 Other intervertebral disc degeneration, lumbar region: Secondary | ICD-10-CM | POA: Diagnosis not present

## 2019-06-08 DIAGNOSIS — M545 Low back pain: Secondary | ICD-10-CM | POA: Diagnosis not present

## 2019-06-08 DIAGNOSIS — M546 Pain in thoracic spine: Secondary | ICD-10-CM | POA: Diagnosis not present

## 2019-06-08 DIAGNOSIS — G894 Chronic pain syndrome: Secondary | ICD-10-CM | POA: Diagnosis not present

## 2019-06-08 DIAGNOSIS — G4700 Insomnia, unspecified: Secondary | ICD-10-CM | POA: Diagnosis not present

## 2019-06-08 DIAGNOSIS — M792 Neuralgia and neuritis, unspecified: Secondary | ICD-10-CM | POA: Diagnosis not present

## 2019-06-15 DIAGNOSIS — R5382 Chronic fatigue, unspecified: Secondary | ICD-10-CM | POA: Diagnosis not present

## 2019-06-15 DIAGNOSIS — M2550 Pain in unspecified joint: Secondary | ICD-10-CM | POA: Diagnosis not present

## 2019-06-24 ENCOUNTER — Other Ambulatory Visit: Payer: Self-pay | Admitting: Cardiovascular Disease

## 2019-07-01 ENCOUNTER — Telehealth (HOSPITAL_COMMUNITY): Payer: Self-pay

## 2019-07-01 NOTE — Telephone Encounter (Signed)
Encounter complete. 

## 2019-07-03 ENCOUNTER — Other Ambulatory Visit (HOSPITAL_COMMUNITY)
Admission: RE | Admit: 2019-07-03 | Discharge: 2019-07-03 | Disposition: A | Discharge status: Home / Self Care | Payer: Medicare Other | Source: Ambulatory Visit | Attending: Cardiovascular Disease | Admitting: Cardiovascular Disease

## 2019-07-03 DIAGNOSIS — Z20822 Contact with and (suspected) exposure to covid-19: Secondary | ICD-10-CM | POA: Diagnosis not present

## 2019-07-03 DIAGNOSIS — Z01812 Encounter for preprocedural laboratory examination: Secondary | ICD-10-CM | POA: Diagnosis not present

## 2019-07-03 LAB — SARS CORONAVIRUS 2 (TAT 6-24 HRS): SARS Coronavirus 2: NEGATIVE

## 2019-07-07 ENCOUNTER — Ambulatory Visit (HOSPITAL_COMMUNITY)
Admission: RE | Admit: 2019-07-07 | Discharge: 2019-07-07 | Disposition: A | Discharge status: Home / Self Care | Payer: Medicare Other | Source: Ambulatory Visit | Attending: Internal Medicine | Admitting: Internal Medicine

## 2019-07-07 ENCOUNTER — Other Ambulatory Visit: Payer: Self-pay

## 2019-07-07 ENCOUNTER — Encounter: Payer: Self-pay | Admitting: *Deleted

## 2019-07-07 DIAGNOSIS — E782 Mixed hyperlipidemia: Secondary | ICD-10-CM

## 2019-07-07 DIAGNOSIS — I2510 Atherosclerotic heart disease of native coronary artery without angina pectoris: Secondary | ICD-10-CM

## 2019-07-09 ENCOUNTER — Encounter (HOSPITAL_COMMUNITY): Payer: Medicare Other

## 2019-07-09 ENCOUNTER — Telehealth (HOSPITAL_COMMUNITY): Payer: Self-pay

## 2019-07-09 NOTE — Telephone Encounter (Signed)
Encounter complete. 

## 2019-07-11 ENCOUNTER — Other Ambulatory Visit: Payer: Self-pay | Admitting: Internal Medicine

## 2019-07-13 DIAGNOSIS — M5136 Other intervertebral disc degeneration, lumbar region: Secondary | ICD-10-CM | POA: Diagnosis not present

## 2019-07-13 DIAGNOSIS — M546 Pain in thoracic spine: Secondary | ICD-10-CM | POA: Diagnosis not present

## 2019-07-13 DIAGNOSIS — M545 Low back pain: Secondary | ICD-10-CM | POA: Diagnosis not present

## 2019-07-13 DIAGNOSIS — G894 Chronic pain syndrome: Secondary | ICD-10-CM | POA: Diagnosis not present

## 2019-07-14 ENCOUNTER — Other Ambulatory Visit: Payer: Self-pay

## 2019-07-14 ENCOUNTER — Ambulatory Visit (HOSPITAL_COMMUNITY): Admission: RE | Admit: 2019-07-14 | Payer: Medicare Other | Source: Ambulatory Visit

## 2019-07-14 ENCOUNTER — Encounter (HOSPITAL_COMMUNITY): Payer: Self-pay

## 2019-07-14 ENCOUNTER — Encounter: Payer: Self-pay | Admitting: Cardiovascular Disease

## 2019-07-14 ENCOUNTER — Other Ambulatory Visit: Payer: Self-pay | Admitting: *Deleted

## 2019-07-14 DIAGNOSIS — R9431 Abnormal electrocardiogram [ECG] [EKG]: Secondary | ICD-10-CM

## 2019-07-16 ENCOUNTER — Telehealth (HOSPITAL_COMMUNITY): Payer: Self-pay

## 2019-07-16 NOTE — Telephone Encounter (Signed)
Encounter complete. 

## 2019-07-18 ENCOUNTER — Other Ambulatory Visit: Payer: Self-pay | Admitting: Internal Medicine

## 2019-07-21 ENCOUNTER — Other Ambulatory Visit: Payer: Self-pay

## 2019-07-21 ENCOUNTER — Ambulatory Visit (HOSPITAL_COMMUNITY)
Admission: RE | Admit: 2019-07-21 | Discharge: 2019-07-21 | Disposition: A | Discharge status: Home / Self Care | Payer: Medicare Other | Source: Ambulatory Visit | Attending: Cardiology | Admitting: Cardiology

## 2019-07-21 DIAGNOSIS — R9431 Abnormal electrocardiogram [ECG] [EKG]: Secondary | ICD-10-CM | POA: Diagnosis not present

## 2019-07-21 LAB — MYOCARDIAL PERFUSION IMAGING
LV dias vol: 92 mL (ref 46–106)
LV sys vol: 39 mL
Peak HR: 104 {beats}/min
Rest HR: 76 {beats}/min
SDS: 1
SRS: 1
SSS: 2
TID: 1.34

## 2019-07-21 MED ORDER — TECHNETIUM TC 99M TETROFOSMIN IV KIT
10.6000 | PACK | Freq: Once | INTRAVENOUS | Status: AC | PRN
  Administered 2019-07-21: 10.6 via INTRAVENOUS
  Filled 2019-07-21: qty 11

## 2019-07-21 MED ORDER — TECHNETIUM TC 99M TETROFOSMIN IV KIT
32.1000 | PACK | Freq: Once | INTRAVENOUS | Status: AC | PRN
  Administered 2019-07-21: 32.1 via INTRAVENOUS
  Filled 2019-07-21: qty 33

## 2019-07-21 MED ORDER — REGADENOSON 0.4 MG/5ML IV SOLN
0.4000 mg | Freq: Once | INTRAVENOUS | Status: AC
Start: 2019-07-21 — End: 2019-07-21
  Administered 2019-07-21: 0.4 mg via INTRAVENOUS

## 2019-07-22 ENCOUNTER — Other Ambulatory Visit: Payer: Self-pay | Admitting: Internal Medicine

## 2019-07-22 DIAGNOSIS — Z1231 Encounter for screening mammogram for malignant neoplasm of breast: Secondary | ICD-10-CM

## 2019-08-10 ENCOUNTER — Other Ambulatory Visit (INDEPENDENT_AMBULATORY_CARE_PROVIDER_SITE_OTHER): Payer: Medicare Other

## 2019-08-10 DIAGNOSIS — R739 Hyperglycemia, unspecified: Secondary | ICD-10-CM

## 2019-08-10 DIAGNOSIS — D751 Secondary polycythemia: Secondary | ICD-10-CM

## 2019-08-10 DIAGNOSIS — M7062 Trochanteric bursitis, left hip: Secondary | ICD-10-CM | POA: Diagnosis not present

## 2019-08-10 DIAGNOSIS — M546 Pain in thoracic spine: Secondary | ICD-10-CM | POA: Diagnosis not present

## 2019-08-10 DIAGNOSIS — M7061 Trochanteric bursitis, right hip: Secondary | ICD-10-CM | POA: Diagnosis not present

## 2019-08-10 DIAGNOSIS — G894 Chronic pain syndrome: Secondary | ICD-10-CM | POA: Diagnosis not present

## 2019-08-10 LAB — CBC WITH DIFFERENTIAL/PLATELET
Basophils Absolute: 0.1 10*3/uL (ref 0.0–0.1)
Basophils Relative: 0.7 % (ref 0.0–3.0)
Eosinophils Absolute: 0.3 10*3/uL (ref 0.0–0.7)
Eosinophils Relative: 3.6 % (ref 0.0–5.0)
HCT: 43.2 % (ref 36.0–46.0)
Hemoglobin: 15.1 g/dL — ABNORMAL HIGH (ref 12.0–15.0)
Lymphocytes Relative: 39.3 % (ref 12.0–46.0)
Lymphs Abs: 3 10*3/uL (ref 0.7–4.0)
MCHC: 34.9 g/dL (ref 30.0–36.0)
MCV: 90.9 fl (ref 78.0–100.0)
Monocytes Absolute: 0.9 10*3/uL (ref 0.1–1.0)
Monocytes Relative: 11.3 % (ref 3.0–12.0)
Neutro Abs: 3.5 10*3/uL (ref 1.4–7.7)
Neutrophils Relative %: 45.1 % (ref 43.0–77.0)
Platelets: 196 10*3/uL (ref 150.0–400.0)
RBC: 4.76 Mil/uL (ref 3.87–5.11)
RDW: 13.2 % (ref 11.5–15.5)
WBC: 7.7 10*3/uL (ref 4.0–10.5)

## 2019-08-10 LAB — BASIC METABOLIC PANEL
BUN: 11 mg/dL (ref 6–23)
CO2: 28 mEq/L (ref 19–32)
Calcium: 8.9 mg/dL (ref 8.4–10.5)
Chloride: 104 mEq/L (ref 96–112)
Creatinine, Ser: 0.7 mg/dL (ref 0.40–1.20)
GFR: 89.81 mL/min (ref >60.00)
Glucose, Bld: 149 mg/dL — ABNORMAL HIGH (ref 70–99)
Potassium: 4.1 mEq/L (ref 3.5–5.1)
Sodium: 139 mEq/L (ref 135–145)

## 2019-08-10 LAB — HEMOGLOBIN A1C: Hgb A1c MFr Bld: 5.7 % (ref 4.6–6.5)

## 2019-08-12 DIAGNOSIS — G894 Chronic pain syndrome: Secondary | ICD-10-CM | POA: Diagnosis not present

## 2019-08-13 ENCOUNTER — Ambulatory Visit: Payer: Medicare Other | Admitting: Cardiovascular Disease

## 2019-08-13 ENCOUNTER — Encounter: Payer: Self-pay | Admitting: Cardiovascular Disease

## 2019-08-13 ENCOUNTER — Other Ambulatory Visit: Payer: Self-pay

## 2019-08-13 VITALS — BP 122/81 | HR 71 | Ht 66.0 in | Wt 197.8 lb

## 2019-08-13 DIAGNOSIS — I10 Essential (primary) hypertension: Secondary | ICD-10-CM

## 2019-08-13 DIAGNOSIS — D45 Polycythemia vera: Secondary | ICD-10-CM | POA: Diagnosis not present

## 2019-08-13 DIAGNOSIS — I2510 Atherosclerotic heart disease of native coronary artery without angina pectoris: Secondary | ICD-10-CM

## 2019-08-13 DIAGNOSIS — E785 Hyperlipidemia, unspecified: Secondary | ICD-10-CM

## 2019-08-13 DIAGNOSIS — F17200 Nicotine dependence, unspecified, uncomplicated: Secondary | ICD-10-CM

## 2019-08-13 NOTE — Progress Notes (Signed)
Cardiology Office Note:    Date:  08/15/2019   ID:  [name], DOB 01/05/1971, MRN [id number]  PCP:  Melissa Anger, MD  Cardiologist:  Melissa Klein, MD  Electrophysiologist:  None   Referring MD: Melissa Anger, MD   Chief Complaint  Patient presents with   Hyperlipidemia   Follow-up    stress test   History of Present Illness:    [name] is a 49 y.o. female smoker with a hx of essential hypertension, symptomatic sinus tachycardia responsive to beta-blockers, possible polycythemia vera managed with phlebotomy, migraines, adult ADD, dyslipidemia and a strong family history of premature coronary artery disease.  She underwent a coronary calcium score.  This showed a total score of 215 which is 99th percentile for her age/gender.  There was also evidence of atherosclerosis in the thoracic aorta.  The patient specifically denies any chest pain at rest exertion, dyspnea at rest or with exertion, orthopnea, paroxysmal nocturnal dyspnea, syncope, palpitations, focal neurological deficits, intermittent claudication, lower extremity edema, unexplained weight gain, cough, hemoptysis or wheezing.  Initially scheduled her for a plain treadmill stress test, but after holding her beta-blocker she came in tachycardic and had baseline ST segment abnormalities so we switched her tests to a nuclear study.  This showed normal perfusion.  Her EKG performed today, with a normal heart rate, no longer shows any ST segment changes.  She has been unable to quit smoking, still about a pack a day.  Her husband also smokes and he finds it impossible to quit since he has a lot of residual pain following a motorcycle accident.  Repeat labs show a remarkable improvement in her LDL cholesterol which is now down to 46.  She still has a very low HDL at 21.  Together with her elevated triglycerides at 177, this is suggestive of metabolic syndrome/insulin resistance.  Her hemoglobin A1c  was borderline at 5.7%.  Both [name]'s parents had diabetes mellitus.  Her father had early onset CAD, had bypass surgery at age 47 and died at age 14.  Both her grandfathers died in their 64s.   Past Medical History:  Diagnosis Date   Anxiety state, unspecified    Arthritis    Asthma    Attention deficit disorder without mention of hyperactivity    Bipolar disorder ([place])    Constipation    takes Linzess   Depressive disorder, not elsewhere classified    Esophageal reflux    Hypertension    Migraine without aura, without mention of intractable migraine without mention of status migrainosus    Pain in joint, site unspecified    Pneumonia    Polycythemia    per pt   Unspecified asthma(493.90)    Unspecified essential hypertension    Unspecified vitamin D deficiency     Past Surgical History:  Procedure Laterality Date   ABDOMINAL HYSTERECTOMY     abdominal ultrasound  10/12/97   APPENDECTOMY  05/27/2013   gangrenous   INCISE AND DRAIN ABCESS  2011   Right axilla- Dr. Barkley Bruns   KNEE ARTHROSCOPY     both knees   KNEE ARTHROSCOPY WITH FULKERSON SLIDE Right 10/17/2015   Procedure: KNEE ARTHROSCOPY WITH Elmarie Mainland;  Surgeon: Melrose Nakayama, MD;  Location: [facility];  Service: Orthopedics;  Laterality: Right;   NM MYOCAR PERF WALL MOTION  11/2009   bruce myoview; perfusion defect in anterior region consistent with breast attenuation; remaining myocardium with normal perfusion; post-stress EF 74%; low risk scan  ORIF TIBIA FRACTURE Right 11/24/2015   Procedure: OPEN REDUCTION INTERNAL FIXATION (ORIF) TIBIA FRACTURE;  Surgeon: Melrose Nakayama, MD;  Location: [place];  Service: Orthopedics;  Laterality: Right;   PARTIAL HYSTERECTOMY  2007   PLANTAR FASCIA SURGERY  2000   TONSILLECTOMY  04/29/00   TONSILLECTOMY Bilateral    TRANSTHORACIC ECHOCARDIOGRAM  11/2009   EF=>55%; trace MR & TR    Current Medications: Current Meds  Medication Sig    albuterol (PROAIR HFA) 108 (90 Base) MCG/ACT inhaler Inhale 1-2 puffs into the lungs every 4 (four) hours as needed for wheezing or shortness of breath.   albuterol (PROVENTIL) (2.5 MG/3ML) 0.083% nebulizer solution Take 3 mLs (2.5 mg total) by nebulization every 6 (six) hours as needed. For shortness of breath.   amphetamine-dextroamphetamine (ADDERALL) 10 MG tablet Take 10 mg by mouth daily.   Asenapine Maleate (SAPHRIS) 10 MG SUBL Place under the tongue at bedtime.   aspirin (BAYER ASPIRIN) 325 MG tablet Take 1 tablet (325 mg total) by mouth daily.   atorvastatin (LIPITOR) 20 MG tablet TAKE 1 TABLET BY MOUTH EVERY DAY   budesonide-formoterol (SYMBICORT) 160-4.5 MCG/ACT inhaler INHALE 2 PUFFS BY MOUTH TWICE A DAY   cyclobenzaprine (FLEXERIL) 10 MG tablet TAKE 1/2 - 1 TABLET BY MOUTH DAILY OR TWICE DAILY IF TOLERATED   famotidine (PEPCID) 40 MG tablet TAKE 1 TABLET BY MOUTH  DAILY   gabapentin (NEURONTIN) 100 MG capsule TAKE 1 CAPSULE BY MOUTH 3  TIMES DAILY   gabapentin (NEURONTIN) 300 MG capsule Take 1 capsule (300 mg total) by mouth 3 (three) times daily.   HYDROcodone-acetaminophen (NORCO/VICODIN) 5-325 MG tablet Take 1-2 tablets by mouth every 4 (four) hours as needed for moderate pain. (Patient taking differently: Take 1-2 tablets by mouth every 4 (four) hours as needed for moderate pain. 1/2-1 tablet every day PRN, Moderate pain)   linaclotide (LINZESS) 145 MCG CAPS capsule Take 1-2 capsules (145-290 mcg total) by mouth daily as needed.   meloxicam (MOBIC) 15 MG tablet TAKE 1 TABLET BY MOUTH  DAILY   metoprolol tartrate (LOPRESSOR) 100 MG tablet TAKE 1 TABLET BY MOUTH  TWICE DAILY   nortriptyline (PAMELOR) 25 MG capsule TAKE 1 CAPSULE BY MOUTH AT  BEDTIME. KEEP SCHEDULED  APPT FOR FUTURE REFILLS   pantoprazole (PROTONIX) 40 MG tablet TAKE 1 TABLET BY MOUTH  DAILY   traZODone (DESYREL) 100 MG tablet Take 100 mg by mouth at bedtime.     Allergies:   Lisinopril, Nizoral  [ketoconazole], Amoxicillin, Ancef [cefazolin], Doxycycline, Hydrochlorothiazide, Penicillins, Percocet [oxycodone-acetaminophen], and Talwin [pentazocine]   Social History   Socioeconomic History   Marital status: Married    Spouse name: Not on file   Number of children: 2   Years of education: Not on file   Highest education level: Not on file  Occupational History   Occupation: HR; waiting table 2 d/wk; back to school; filed for disability; lost her job 2011     Employer: [employer]  Tobacco Use   Smoking status: Current Every Day Smoker    Packs/day: 1.00    Years: 27.00    Pack years: 27.00    Types: Cigarettes    Start date: 10/31/1986    Last attempt to quit: 11/22/2014    Years since quitting: 4.7   Smokeless tobacco: Never Used   Tobacco comment: trying to quit  Substance and Sexual Activity   Alcohol use: No    Alcohol/week: 0.0 standard drinks   Drug use: No  Sexual activity: Yes    Birth control/protection: None  Other Topics Concern   Not on file  Social History Narrative   Patient lives at home with her mother.   Disabled   Right handed   Caffeine three cups daily   Some college education   Social Determinants of Health   Financial Resource Strain:    Difficulty of Paying Living Expenses:   Food Insecurity:    Worried About Charity fundraiser in the Last Year:    Arboriculturist in the Last Year:   Transportation Needs:    Film/video editor (Medical):    Lack of Transportation (Non-Medical):   Physical Activity:    Days of Exercise per Week:    Minutes of Exercise per Session:   Stress:    Feeling of Stress :   Social Connections:    Frequency of Communication with Friends and Family:    Frequency of Social Gatherings with Friends and Family:    Attends Religious Services:    Active Member of Clubs or Organizations:    Attends Archivist Meetings:    Marital Status:      Family History: The patient's  family history includes ADD / ADHD in her child; Alcohol abuse in her brother and sister; Asthma in her child and another family member; Bipolar disorder in her sister; Breast cancer in her mother; COPD in her paternal grandmother; Cancer (age of onset: 50) in her mother; Coronary artery disease in an other family member; Depression in her mother; Diabetes in her paternal grandfather and another family member; Heart disease in her father, maternal grandfather, and paternal grandfather; Lupus in her sister; Multiple sclerosis in her mother.  ROS:   Please see the history of present illness.    All other systems are reviewed and are negative.   EKGs/Labs/Other Studies Reviewed:    The following studies were reviewed today: Calcium score CT scan 11/02/2018 FINDINGS: Non-cardiac: See separate report from [facility]. Ascending Aorta: Normal caliber with trace aortic root calcification. Pericardium: Normal. Coronary arteries: Normal origins. IMPRESSION: Coronary calcium score of 215. This was 99th percentile for age and sex matched control. Echocardiogram 2015 (normal) Left ventricle: The cavity size was normal. Systolic  function was normal. The estimated ejection fraction was in  the range of 60% to 65%. Wall motion was normal; there were  no regional wall motion abnormalities. Left ventricular  diastolic function parameters were normal.  07/21/2019 nuclear stress test  The left ventricular ejection fraction is normal (55-65%).  Nuclear stress EF: 58%.  There was no ST segment deviation noted during stress.  The perfusion study is normal.  The TID is elevated at 1.34 which could be suggestive of transient ischemic dilatation which can be seen in multivessel CAD. This is new from scan of 2011 (TID 0.94).  This is a low risk study.  EKG:  EKG is  ordered today.  The ekg ordered today demonstrates normal sinus rhythm, questionable left atrial abnormality, otherwise normal  tracing  Recent Labs: 08/10/2019: BUN 11; Creatinine, Ser 0.70; Hemoglobin 15.1; Platelets 196.0; Potassium 4.1; Sodium 139  Recent Lipid Panel    Component Value Date/Time   CHOL 97 (L) 05/10/2019 1458   TRIG 177 (H) 05/10/2019 1458   HDL 21 (L) 05/10/2019 1458   CHOLHDL 4.6 (H) 05/10/2019 1458   CHOLHDL 6 02/26/2018 0816   VLDL 41.8 (H) 02/26/2018 0816   LDLCALC 46 05/10/2019 1458   LDLDIRECT 121.0 02/26/2018 3220  Physical Exam:    VS:  BP 122/81    Pulse 71    Ht 5\' 6"  (1.676 m)    Wt 197 lb 12.8 oz (89.7 kg)    SpO2 97%    BMI 31.93 kg/m     Wt Readings from Last 3 Encounters:  08/13/19 197 lb 12.8 oz (89.7 kg)  07/21/19 198 lb (89.8 kg)  03/02/19 198 lb (89.8 kg)     General: Alert, oriented x3, no distress, mildly obese Head: no evidence of trauma, PERRL, EOMI, no exophtalmos or lid lag, no myxedema, no xanthelasma; normal ears, nose and oropharynx Neck: normal jugular venous pulsations and no hepatojugular reflux; brisk carotid pulses without delay and no carotid bruits Chest: clear to auscultation, no signs of consolidation by percussion or palpation, normal fremitus, symmetrical and full respiratory excursions Cardiovascular: normal position and quality of the apical impulse, regular rhythm, normal first and second heart sounds, no murmurs, rubs or gallops Abdomen: no tenderness or distention, no masses by palpation, no abnormal pulsatility or arterial bruits, normal bowel sounds, no hepatosplenomegaly Extremities: no clubbing, cyanosis or edema; 2+ radial, ulnar and brachial pulses bilaterally; 2+ right femoral, posterior tibial and dorsalis pedis pulses; 2+ left femoral, posterior tibial and dorsalis pedis pulses; no subclavian or femoral bruits Neurological: grossly nonfocal Psych: Normal mood and affect   ASSESSMENT:    1. Coronary artery disease involving native coronary artery of native heart without angina pectoris   2. Dyslipidemia (high LDL; low HDL)     3. Essential hypertension   4. Polycythemia vera ([place])   5. Smoking    PLAN:    In order of problems listed above:  1. CAD: Asymptomatic but with severely elevated calcium score and multiple coronary risk factors.  Low risk nuclear stress test.  Note that she had ST segment changes on ECG with tachycardia and is therefore likely to always have a "false positive" treadmill stress test. 2. HLP: She has a typical metabolic syndrome/insulin resistance lipid profile, at high risk for  progression of vascular disease.  Very low HDL, elevated triglycerides, likely to have small dense LDL with high particle number.  After statin was initiated, her LDL is dramatically improved, but her other lipid parameters will not improve without increase physical activity and weight loss. 3. HTN: Well-controlled.  She feels great on beta-blockers since these also help with her anxiety and tachycardia.  She did not do well with diuretics. 4. P.vera: Managed with intermittent phlebotomy.  Aspirin 325 mg once daily.  Reports having had a home sleep study that was negative.  Does not appear to have sufficient lung problems or smoke enough to cause a secondary polycythemia. 5. Smoking cessation: Reviewed again how important it is to stop smoking to prevent serious complications of coronary vascular disease.  It is apparent that both she and her husband will have to quit smoking simultaneously.   Medication Adjustments/Labs and Tests Ordered: Current medicines are reviewed at length with the patient today.  Concerns regarding medicines are outlined above.  No orders of the defined types were placed in this encounter.  No orders of the defined types were placed in this encounter.   Patient Instructions  Medication Instructions:  No changes *If you need a refill on your cardiac medications before your next appointment, please call your pharmacy*   Lab Work: None ordered If you have labs (blood work) drawn today  and your tests are completely normal, you will receive your results only by:  MyChart Message (if you have MyChart) OR  A paper copy in the mail If you have any lab test that is abnormal or we need to change your treatment, we will call you to review the results.   Testing/Procedures: None ordered   Follow-Up: At [facility], you and your health needs are our priority.  As part of our continuing mission to provide you with exceptional heart care, we have created designated Provider Care Teams.  These Care Teams include your primary Cardiologist (physician) and Advanced Practice Providers (APPs -  Physician Assistants and Nurse Practitioners) who all work together to provide you with the care you need, when you need it.  We recommend signing up for the patient portal called "MyChart".  Sign up information is provided on this After Visit Summary.  MyChart is used to connect with patients for Virtual Visits (Telemedicine).  Patients are able to view lab/test results, encounter notes, upcoming appointments, etc.  Non-urgent messages can be sent to your provider as well.   To learn more about what you can do with MyChart, go to [email or web address].    Your next appointment:   12 month(s)  The format for your next appointment:   In Person  Provider:   You may see Melissa Klein, MD or one of the following Advanced Practice Providers on your designated Care Team:    Almyra Deforest, PA-C  Fabian Sharp, [place] or   Roby Lofts, PA-C       Signed, Melissa Klein, MD  08/15/2019 5:06 PM    [place]

## 2019-08-13 NOTE — Patient Instructions (Signed)

## 2019-08-15 ENCOUNTER — Encounter: Payer: Self-pay | Admitting: Cardiovascular Disease

## 2019-08-16 DIAGNOSIS — M7061 Trochanteric bursitis, right hip: Secondary | ICD-10-CM | POA: Diagnosis not present

## 2019-08-16 DIAGNOSIS — M25552 Pain in left hip: Secondary | ICD-10-CM | POA: Diagnosis not present

## 2019-08-26 ENCOUNTER — Other Ambulatory Visit: Payer: Self-pay

## 2019-08-26 ENCOUNTER — Ambulatory Visit
Admission: RE | Admit: 2019-08-26 | Discharge: 2019-08-26 | Disposition: A | Discharge status: Home / Self Care | Payer: Medicare Other | Source: Ambulatory Visit | Attending: Internal Medicine | Admitting: Internal Medicine

## 2019-08-26 DIAGNOSIS — Z1231 Encounter for screening mammogram for malignant neoplasm of breast: Secondary | ICD-10-CM

## 2019-08-30 ENCOUNTER — Encounter: Payer: Self-pay | Admitting: Internal Medicine

## 2019-08-30 ENCOUNTER — Other Ambulatory Visit: Payer: Self-pay

## 2019-08-30 ENCOUNTER — Ambulatory Visit (INDEPENDENT_AMBULATORY_CARE_PROVIDER_SITE_OTHER): Payer: Medicare Other | Admitting: Internal Medicine

## 2019-08-30 VITALS — BP 124/76 | HR 73 | Temp 98.5°F | Ht 66.0 in | Wt 198.0 lb

## 2019-08-30 DIAGNOSIS — D751 Secondary polycythemia: Secondary | ICD-10-CM | POA: Diagnosis not present

## 2019-08-30 DIAGNOSIS — I2510 Atherosclerotic heart disease of native coronary artery without angina pectoris: Secondary | ICD-10-CM

## 2019-08-30 DIAGNOSIS — E559 Vitamin D deficiency, unspecified: Secondary | ICD-10-CM | POA: Diagnosis not present

## 2019-08-30 DIAGNOSIS — I10 Essential (primary) hypertension: Secondary | ICD-10-CM

## 2019-08-30 DIAGNOSIS — I2583 Coronary atherosclerosis due to lipid rich plaque: Secondary | ICD-10-CM

## 2019-08-30 DIAGNOSIS — Z0000 Encounter for general adult medical examination without abnormal findings: Secondary | ICD-10-CM | POA: Diagnosis not present

## 2019-08-30 NOTE — Assessment & Plan Note (Signed)
Vit D 

## 2019-08-30 NOTE — Assessment & Plan Note (Signed)
Phlebotomy prn for Hct>45% Needs to quit smoking

## 2019-08-30 NOTE — Assessment & Plan Note (Signed)
  We discussed age appropriate health related issues, including available/recomended screening tests and vaccinations. Labs were ordered to be later reviewed . All questions were answered. We discussed one or more of the following - seat belt use, use of sunscreen/sun exposure exercise, safe sex, fall risk reduction, second hand smoke exposure, firearm use and storage, seat belt use, a need for adhering to healthy diet and exercise. Labs were  discussed if they are available. All questions were answered.

## 2019-08-30 NOTE — Assessment & Plan Note (Signed)
On Metoprolol 

## 2019-08-30 NOTE — Progress Notes (Signed)
Subjective:  Patient ID: [name], female    DOB: 23-Jun-1970  Age: 49 y.o. MRN: [id number]  CC: No chief complaint on file.   HPI [name] presents for a well exam  Outpatient Medications Prior to Visit  Medication Sig Dispense Refill  . albuterol (PROAIR HFA) 108 (90 Base) MCG/ACT inhaler Inhale 1-2 puffs into the lungs every 4 (four) hours as needed for wheezing or shortness of breath. 8.5 Inhaler 5  . albuterol (PROVENTIL) (2.5 MG/3ML) 0.083% nebulizer solution Take 3 mLs (2.5 mg total) by nebulization every 6 (six) hours as needed. For shortness of breath. 75 mL 0  . amphetamine-dextroamphetamine (ADDERALL) 10 MG tablet Take 10 mg by mouth daily.  0  . Asenapine Maleate (SAPHRIS) 10 MG SUBL Place under the tongue at bedtime.    [name] aspirin (BAYER ASPIRIN) 325 MG tablet Take 1 tablet (325 mg total) by mouth daily. 100 tablet 3  . atorvastatin (LIPITOR) 20 MG tablet TAKE 1 TABLET BY MOUTH EVERY DAY 30 tablet 0  . budesonide-formoterol (SYMBICORT) 160-4.5 MCG/ACT inhaler INHALE 2 PUFFS BY MOUTH TWICE A DAY 10.2 Inhaler 5  . cyclobenzaprine (FLEXERIL) 10 MG tablet TAKE 1/2 - 1 TABLET BY MOUTH DAILY OR TWICE DAILY IF TOLERATED  0  . famotidine (PEPCID) 40 MG tablet TAKE 1 TABLET BY MOUTH  DAILY 90 tablet 3  . gabapentin (NEURONTIN) 100 MG capsule TAKE 1 CAPSULE BY MOUTH 3  TIMES DAILY 270 capsule 3  . gabapentin (NEURONTIN) 300 MG capsule Take 1 capsule (300 mg total) by mouth 3 (three) times daily. 270 capsule 1  . HYDROcodone-acetaminophen (NORCO/VICODIN) 5-325 MG tablet Take 1-2 tablets by mouth every 4 (four) hours as needed for moderate pain. (Patient taking differently: Take 1-2 tablets by mouth every 4 (four) hours as needed for moderate pain. 1/2-1 tablet every day PRN, Moderate pain) 30 tablet 0  . linaclotide (LINZESS) 145 MCG CAPS capsule Take 1-2 capsules (145-290 mcg total) by mouth daily as needed. 180 capsule 3  . meloxicam (MOBIC) 15 MG tablet TAKE 1 TABLET BY  MOUTH  DAILY 90 tablet 3  . metoprolol tartrate (LOPRESSOR) 100 MG tablet TAKE 1 TABLET BY MOUTH  TWICE DAILY 180 tablet 3  . nortriptyline (PAMELOR) 25 MG capsule TAKE 1 CAPSULE BY MOUTH AT  BEDTIME. KEEP SCHEDULED  APPT FOR FUTURE REFILLS 90 capsule 3  . pantoprazole (PROTONIX) 40 MG tablet TAKE 1 TABLET BY MOUTH  DAILY 90 tablet 3  . traZODone (DESYREL) 100 MG tablet Take 100 mg by mouth at bedtime.     No facility-administered medications prior to visit.    ROS: Review of Systems  Constitutional: Negative for activity change, appetite change, chills, fatigue and unexpected weight change.  HENT: Negative for congestion, mouth sores and sinus pressure.   Eyes: Negative for visual disturbance.  Respiratory: Negative for cough and chest tightness.   Gastrointestinal: Negative for abdominal pain and nausea.  Genitourinary: Negative for difficulty urinating, frequency and vaginal pain.  Musculoskeletal: Negative for back pain and gait problem.  Skin: Negative for pallor and rash.  Neurological: Negative for dizziness, tremors, weakness, numbness and headaches.  Psychiatric/Behavioral: Negative for confusion, sleep disturbance and suicidal ideas.    Objective:  BP 124/76 (BP Location: Right Arm, Patient Position: Sitting, Cuff Size: Large)   Pulse 73   Temp 98.5 F (36.9 C) (Oral)   Ht 5\' 6"  (1.676 m)   Wt 198 lb (89.8 kg)   SpO2 96%   BMI  31.96 kg/m   BP Readings from Last 3 Encounters:  08/30/19 124/76  08/13/19 122/81  03/02/19 104/76    Wt Readings from Last 3 Encounters:  08/30/19 198 lb (89.8 kg)  08/13/19 197 lb 12.8 oz (89.7 kg)  07/21/19 198 lb (89.8 kg)    Physical Exam Constitutional:      General: She is not in acute distress.    Appearance: She is well-developed.  HENT:     Head: Normocephalic.     Right Ear: External ear normal.     Left Ear: External ear normal.     Nose: Nose normal.  Eyes:     General:        Right eye: No discharge.        Left  eye: No discharge.     Conjunctiva/sclera: Conjunctivae normal.     Pupils: Pupils are equal, round, and reactive to light.  Neck:     Thyroid: No thyromegaly.     Vascular: No JVD.     Trachea: No tracheal deviation.  Cardiovascular:     Rate and Rhythm: Normal rate and regular rhythm.     Heart sounds: Normal heart sounds.  Pulmonary:     Effort: No respiratory distress.     Breath sounds: No stridor. No wheezing.  Abdominal:     General: Bowel sounds are normal. There is no distension.     Palpations: Abdomen is soft. There is no mass.     Tenderness: There is no abdominal tenderness. There is no guarding or rebound.  Musculoskeletal:        General: No tenderness.     Cervical back: Normal range of motion and neck supple.  Lymphadenopathy:     Cervical: No cervical adenopathy.  Skin:    Findings: No erythema or rash.  Neurological:     Cranial Nerves: No cranial nerve deficit.     Motor: No abnormal muscle tone.     Coordination: Coordination normal.     Deep Tendon Reflexes: Reflexes normal.  Psychiatric:        Behavior: Behavior normal.        Thought Content: Thought content normal.        Judgment: Judgment normal.     Lab Results  Component Value Date   WBC 7.7 08/10/2019   HGB 15.1 (H) 08/10/2019   HCT 43.2 08/10/2019   PLT 196.0 08/10/2019   GLUCOSE 149 (H) 08/10/2019   CHOL 97 (L) 05/10/2019   TRIG 177 (H) 05/10/2019   HDL 21 (L) 05/10/2019   LDLDIRECT 121.0 02/26/2018   LDLCALC 46 05/10/2019   ALT 16 02/26/2018   AST 12 02/26/2018   NA 139 08/10/2019   K 4.1 08/10/2019   CL 104 08/10/2019   CREATININE 0.70 08/10/2019   BUN 11 08/10/2019   CO2 28 08/10/2019   TSH 1.50 02/26/2018   INR 1.05 08/29/2014   HGBA1C 5.7 08/10/2019    No results found.  Assessment & Plan:    Walker Kehr, MD

## 2019-08-30 NOTE — Assessment & Plan Note (Signed)
ASA, Lipitor 

## 2019-09-07 DIAGNOSIS — G894 Chronic pain syndrome: Secondary | ICD-10-CM | POA: Diagnosis not present

## 2019-09-07 DIAGNOSIS — M5136 Other intervertebral disc degeneration, lumbar region: Secondary | ICD-10-CM | POA: Diagnosis not present

## 2019-09-07 DIAGNOSIS — M546 Pain in thoracic spine: Secondary | ICD-10-CM | POA: Diagnosis not present

## 2019-09-07 DIAGNOSIS — M545 Low back pain: Secondary | ICD-10-CM | POA: Diagnosis not present

## 2019-09-12 DIAGNOSIS — G894 Chronic pain syndrome: Secondary | ICD-10-CM | POA: Diagnosis not present

## 2019-09-17 ENCOUNTER — Other Ambulatory Visit: Payer: Self-pay | Admitting: Internal Medicine

## 2019-09-23 ENCOUNTER — Other Ambulatory Visit: Payer: Self-pay | Admitting: Internal Medicine

## 2019-09-23 ENCOUNTER — Other Ambulatory Visit: Payer: Self-pay

## 2019-09-23 ENCOUNTER — Ambulatory Visit (INDEPENDENT_AMBULATORY_CARE_PROVIDER_SITE_OTHER): Payer: Medicare Other | Admitting: Internal Medicine

## 2019-09-23 ENCOUNTER — Encounter: Payer: Self-pay | Admitting: Internal Medicine

## 2019-09-23 DIAGNOSIS — B079 Viral wart, unspecified: Secondary | ICD-10-CM

## 2019-09-23 DIAGNOSIS — D485 Neoplasm of uncertain behavior of skin: Secondary | ICD-10-CM | POA: Insufficient documentation

## 2019-09-23 NOTE — Assessment & Plan Note (Signed)
Moles/freckles - will watch Changing mole on the R thigh - see bx

## 2019-09-23 NOTE — Progress Notes (Signed)
Subjective:  Patient ID: [name], female    DOB: 1970/11/16  Age: 49 y.o. MRN: [id number]  CC: No chief complaint on file.   HPI [name] presents for skin moles  Outpatient Medications Prior to Visit  Medication Sig Dispense Refill  . albuterol (PROAIR HFA) 108 (90 Base) MCG/ACT inhaler Inhale 1-2 puffs into the lungs every 4 (four) hours as needed for wheezing or shortness of breath. 8.5 Inhaler 5  . albuterol (PROVENTIL) (2.5 MG/3ML) 0.083% nebulizer solution Take 3 mLs (2.5 mg total) by nebulization every 6 (six) hours as needed. For shortness of breath. 75 mL 0  . amphetamine-dextroamphetamine (ADDERALL) 10 MG tablet Take 10 mg by mouth daily.  0  . aspirin (BAYER ASPIRIN) 325 MG tablet Take 1 tablet (325 mg total) by mouth daily. 100 tablet 3  . atorvastatin (LIPITOR) 20 MG tablet TAKE 1 TABLET BY MOUTH EVERY DAY 30 tablet 0  . budesonide-formoterol (SYMBICORT) 160-4.5 MCG/ACT inhaler INHALE 2 PUFFS BY MOUTH TWICE A DAY 10.2 Inhaler 5  . cyclobenzaprine (FLEXERIL) 10 MG tablet TAKE 1/2 - 1 TABLET BY MOUTH DAILY OR TWICE DAILY IF TOLERATED  0  . famotidine (PEPCID) 40 MG tablet TAKE 1 TABLET BY MOUTH  DAILY 90 tablet 3  . gabapentin (NEURONTIN) 100 MG capsule TAKE 1 CAPSULE BY MOUTH 3  TIMES DAILY 270 capsule 3  . gabapentin (NEURONTIN) 300 MG capsule Take 1 capsule (300 mg total) by mouth 3 (three) times daily. 270 capsule 1  . HYDROcodone-acetaminophen (NORCO/VICODIN) 5-325 MG tablet Take 1-2 tablets by mouth every 4 (four) hours as needed for moderate pain. (Patient taking differently: Take 1-2 tablets by mouth every 4 (four) hours as needed for moderate pain. 1/2-1 tablet every day PRN, Moderate pain) 30 tablet 0  . linaclotide (LINZESS) 145 MCG CAPS capsule Take 1-2 capsules (145-290 mcg total) by mouth daily as needed. 180 capsule 3  . meloxicam (MOBIC) 15 MG tablet TAKE 1 TABLET BY MOUTH  DAILY 90 tablet 3  . metoprolol tartrate (LOPRESSOR) 100 MG tablet TAKE 1  TABLET BY MOUTH  TWICE DAILY 180 tablet 3  . nortriptyline (PAMELOR) 25 MG capsule TAKE 1 CAPSULE BY MOUTH AT  BEDTIME 90 capsule 3  . pantoprazole (PROTONIX) 40 MG tablet TAKE 1 TABLET BY MOUTH  DAILY 90 tablet 3  . traZODone (DESYREL) 100 MG tablet Take 100 mg by mouth at bedtime.    . Asenapine Maleate (SAPHRIS) 10 MG SUBL Place under the tongue at bedtime. (Patient not taking: Reported on 09/23/2019)     No facility-administered medications prior to visit.    ROS: Review of Systems  Skin: Positive for color change.    Objective:  BP 102/70 (BP Location: Left Arm, Patient Position: Sitting, Cuff Size: Large)   Pulse 70   Temp 98.2 F (36.8 C) (Oral)   Ht 5\' 6"  (1.676 m)   Wt 196 lb (88.9 kg)   SpO2 97%   BMI 31.64 kg/m   BP Readings from Last 3 Encounters:  09/23/19 102/70  08/30/19 124/76  08/13/19 122/81    Wt Readings from Last 3 Encounters:  09/23/19 196 lb (88.9 kg)  08/30/19 198 lb (89.8 kg)  08/13/19 197 lb 12.8 oz (89.7 kg)    Physical Exam Constitutional:      General: She is not in acute distress.    Appearance: She is obese.  Skin:    Findings: Lesion present.   multiple freckles like lesions R thigh pigm  dark lesion   Procedure Note :     Procedure :  Skin biopsy   Indication:  Changing mole    Risks including unsuccessful procedure , bleeding, infection, bruising, scar, a need for another complete procedure and others were explained to the patient in detail as well as the benefits. Informed consent was obtained.  The patient was placed in a decubitus position.  Lesion #1 on R ant thigh    measuring  6x4   mm   Skin over lesion #1  was prepped with Betadine and alcohol  and anesthetized with 1 cc of 2% lidocaine and epinephrine, using a 25-gauge 1 inch needle.  Shave biopsy with a sterile Dermablade was carried out in the usual fashion. Hyfrecator was used to destroy the rest of the lesion potentially left behind and for hemostasis. Band-Aid  was applied with antibiotic ointment.    Tolerated well. Complications none.      Postprocedure instructions :    A Band-Aid should be  changed once or twice daily. You can take a shower tomorrow.  Keep the wounds clean. You can wash them with liquid soap and water. Pat dry with gauze or a Kleenex tissue before applying antibiotic ointment and a Band-Aid.   You need to report immediately  if fever, chills or any signs of infection develop.    The biopsy results should be available in 1 -2 weeks.    Lab Results  Component Value Date   WBC 7.7 08/10/2019   HGB 15.1 (H) 08/10/2019   HCT 43.2 08/10/2019   PLT 196.0 08/10/2019   GLUCOSE 149 (H) 08/10/2019   CHOL 97 (L) 05/10/2019   TRIG 177 (H) 05/10/2019   HDL 21 (L) 05/10/2019   LDLDIRECT 121.0 02/26/2018   LDLCALC 46 05/10/2019   ALT 16 02/26/2018   AST 12 02/26/2018   NA 139 08/10/2019   K 4.1 08/10/2019   CL 104 08/10/2019   CREATININE 0.70 08/10/2019   BUN 11 08/10/2019   CO2 28 08/10/2019   TSH 1.50 02/26/2018   INR 1.05 08/29/2014   HGBA1C 5.7 08/10/2019    MM 3D SCREEN BREAST BILATERAL  Result Date: 08/30/2019 CLINICAL DATA:  Screening. EXAM: DIGITAL SCREENING BILATERAL MAMMOGRAM WITH TOMO AND CAD COMPARISON:  Previous exam(s). ACR Breast Density Category b: There are scattered areas of fibroglandular density. FINDINGS: There are no findings suspicious for malignancy. Images were processed with CAD. IMPRESSION: No mammographic evidence of malignancy. A result letter of this screening mammogram will be mailed directly to the patient. RECOMMENDATION: Screening mammogram in one year. (Code:SM-B-01Y) BI-RADS CATEGORY  1: Negative. Electronically Signed   By: Evangeline Dakin M.D.   On: 08/30/2019 16:06    Assessment & Plan:    Walker Kehr, MD

## 2019-09-23 NOTE — Addendum Note (Signed)
Addended by: Lauralee Evener C on: 09/23/2019 11:50 AM   Modules accepted: Orders

## 2019-10-06 DIAGNOSIS — M7062 Trochanteric bursitis, left hip: Secondary | ICD-10-CM | POA: Diagnosis not present

## 2019-10-06 DIAGNOSIS — G4700 Insomnia, unspecified: Secondary | ICD-10-CM | POA: Diagnosis not present

## 2019-10-06 DIAGNOSIS — M7061 Trochanteric bursitis, right hip: Secondary | ICD-10-CM | POA: Diagnosis not present

## 2019-10-06 DIAGNOSIS — R519 Headache, unspecified: Secondary | ICD-10-CM | POA: Diagnosis not present

## 2019-10-06 DIAGNOSIS — M546 Pain in thoracic spine: Secondary | ICD-10-CM | POA: Diagnosis not present

## 2019-10-06 DIAGNOSIS — G894 Chronic pain syndrome: Secondary | ICD-10-CM | POA: Diagnosis not present

## 2019-10-06 DIAGNOSIS — M25559 Pain in unspecified hip: Secondary | ICD-10-CM | POA: Diagnosis not present

## 2019-10-06 DIAGNOSIS — M47897 Other spondylosis, lumbosacral region: Secondary | ICD-10-CM | POA: Diagnosis not present

## 2019-10-06 DIAGNOSIS — M792 Neuralgia and neuritis, unspecified: Secondary | ICD-10-CM | POA: Diagnosis not present

## 2019-10-06 DIAGNOSIS — G8929 Other chronic pain: Secondary | ICD-10-CM | POA: Diagnosis not present

## 2019-10-22 ENCOUNTER — Other Ambulatory Visit: Payer: Self-pay | Admitting: Internal Medicine

## 2019-10-22 DIAGNOSIS — L749 Eccrine sweat disorder, unspecified: Secondary | ICD-10-CM

## 2019-10-22 MED ORDER — LINACLOTIDE 145 MCG PO CAPS: 145.0000 ug | ORAL_CAPSULE | Freq: Every day | ORAL | 3 refills | Status: DC | PRN

## 2019-10-29 ENCOUNTER — Other Ambulatory Visit (INDEPENDENT_AMBULATORY_CARE_PROVIDER_SITE_OTHER): Payer: Medicare Other

## 2019-10-29 DIAGNOSIS — D751 Secondary polycythemia: Secondary | ICD-10-CM

## 2019-10-29 LAB — CBC WITH DIFFERENTIAL/PLATELET
Basophils Absolute: 0.1 10*3/uL (ref 0.0–0.1)
Basophils Relative: 1.2 % (ref 0.0–3.0)
Eosinophils Absolute: 0.1 10*3/uL (ref 0.0–0.7)
Eosinophils Relative: 2.1 % (ref 0.0–5.0)
HCT: 41.8 % (ref 36.0–46.0)
Hemoglobin: 14.9 g/dL (ref 12.0–15.0)
Lymphocytes Relative: 46.9 % — ABNORMAL HIGH (ref 12.0–46.0)
Lymphs Abs: 2.3 10*3/uL (ref 0.7–4.0)
MCHC: 35.6 g/dL (ref 30.0–36.0)
MCV: 92 fl (ref 78.0–100.0)
Monocytes Absolute: 0.8 10*3/uL (ref 0.1–1.0)
Monocytes Relative: 16.8 % — ABNORMAL HIGH (ref 3.0–12.0)
Neutro Abs: 1.6 10*3/uL (ref 1.4–7.7)
Neutrophils Relative %: 33 % — ABNORMAL LOW (ref 43.0–77.0)
Platelets: 173 10*3/uL (ref 150.0–400.0)
RBC: 4.54 Mil/uL (ref 3.87–5.11)
RDW: 12.5 % (ref 11.5–15.5)
WBC: 4.9 10*3/uL (ref 4.0–10.5)

## 2019-11-01 ENCOUNTER — Telehealth: Payer: Medicare Other | Admitting: Physician Assistant

## 2019-11-01 DIAGNOSIS — Z79899 Other long term (current) drug therapy: Secondary | ICD-10-CM | POA: Diagnosis not present

## 2019-11-01 DIAGNOSIS — J069 Acute upper respiratory infection, unspecified: Secondary | ICD-10-CM | POA: Diagnosis not present

## 2019-11-01 MED ORDER — AZITHROMYCIN 250 MG PO TABS
250.0000 mg | ORAL_TABLET | Freq: Every day | ORAL | 0 refills | Status: DC
Start: 2019-11-01 — End: 2020-02-21

## 2019-11-01 MED ORDER — BENZONATATE 100 MG PO CAPS
100.0000 mg | ORAL_CAPSULE | Freq: Three times a day (TID) | ORAL | 0 refills | Status: AC
Start: 2019-11-01 — End: 2019-11-06

## 2019-11-01 MED ORDER — AZITHROMYCIN 250 MG PO TABS
250.0000 mg | ORAL_TABLET | Freq: Every day | ORAL | 0 refills | Status: DC
Start: 2019-11-01 — End: 2019-11-01

## 2019-11-01 MED ORDER — PREDNISONE 50 MG PO TABS
50.0000 mg | ORAL_TABLET | Freq: Every day | ORAL | 0 refills | Status: AC
Start: 2019-11-01 — End: 2019-11-06

## 2019-11-01 NOTE — Progress Notes (Signed)
We are sorry that you are not feeling well.  Here is how we plan to help!  Based on your presentation I believe you most likely have A cough due to a virus.  This is called viral bronchitis and is best treated by rest, plenty of fluids and control of the cough.  You may use Ibuprofen or Tylenol as directed to help your symptoms.     In addition you may use A prescription cough medication called Tessalon Perles 100mg . You may take 1-2 capsules every 8 hours as needed for your cough.  I have also prescribed a steroid called Prednisone and an antibiotic called Azithromycin in case this progresses to a bacterial infection. Please take this as directed.   From your responses in the eVisit questionnaire you describe inflammation in the upper respiratory tract which is causing a significant cough.  This is commonly called Bronchitis and has four common causes:    Allergies  Viral Infections  Acid Reflux  Bacterial Infection Allergies, viruses and acid reflux are treated by controlling symptoms or eliminating the cause. An example might be a cough caused by taking certain blood pressure medications. You stop the cough by changing the medication. Another example might be a cough caused by acid reflux. Controlling the reflux helps control the cough.  USE OF BRONCHODILATOR ("RESCUE") INHALERS: There is a risk from using your bronchodilator too frequently.  The risk is that over-reliance on a medication which only relaxes the muscles surrounding the breathing tubes can reduce the effectiveness of medications prescribed to reduce swelling and congestion of the tubes themselves.  Although you feel brief relief from the bronchodilator inhaler, your asthma may actually be worsening with the tubes becoming more swollen and filled with mucus.  This can delay other crucial treatments, such as oral steroid medications. If you need to use a bronchodilator inhaler daily, several times per day, you should discuss this  with your provider.  There are probably better treatments that could be used to keep your asthma under control.     HOME CARE . Only take medications as instructed by your medical team. . Complete the entire course of an antibiotic. . Drink plenty of fluids and get plenty of rest. . Avoid close contacts especially the very young and the elderly . Cover your mouth if you cough or cough into your sleeve. . Always remember to wash your hands . A steam or ultrasonic humidifier can help congestion.   GET HELP RIGHT AWAY IF: . You develop worsening fever. . You become short of breath . You cough up blood. . Your symptoms persist after you have completed your treatment plan MAKE SURE YOU   Understand these instructions.  Will watch your condition.  Will get help right away if you are not doing well or get worse.  Your e-visit answers were reviewed by a board certified advanced clinical practitioner to complete your personal care plan.  Depending on the condition, your plan could have included both over the counter or prescription medications. If there is a problem please reply  once you have received a response from your provider. Your safety is important to [place].  If you have drug allergies check your prescription carefully.    You can use MyChart to ask questions about today's visit, request a non-urgent call back, or ask for a work or school excuse for 24 hours related to this e-Visit. If it has been greater than 24 hours you will need to follow up with  your provider, or enter a new e-Visit to address those concerns. You will get an e-mail in the next two days asking about your experience.  I hope that your e-visit has been valuable and will speed your recovery. Thank you for using e-visits.  Approximately 5 minutes was spent documenting and reviewing patient's chart.

## 2019-11-01 NOTE — Addendum Note (Signed)
Addended by: Rodney Booze on: 11/01/2019 09:48 AM   Modules accepted: Orders

## 2019-11-02 DIAGNOSIS — G894 Chronic pain syndrome: Secondary | ICD-10-CM | POA: Diagnosis not present

## 2019-11-02 DIAGNOSIS — M5134 Other intervertebral disc degeneration, thoracic region: Secondary | ICD-10-CM | POA: Diagnosis not present

## 2019-11-02 DIAGNOSIS — M5136 Other intervertebral disc degeneration, lumbar region: Secondary | ICD-10-CM | POA: Diagnosis not present

## 2019-11-02 DIAGNOSIS — M546 Pain in thoracic spine: Secondary | ICD-10-CM | POA: Diagnosis not present

## 2019-11-03 NOTE — Telephone Encounter (Signed)
Rec'd pt Linzess 145 mcg. [facility] # [id number]. Tracking [phone]. Called pt inform her we rec'd will live at front desk for pick-up.[name]

## 2019-11-04 ENCOUNTER — Other Ambulatory Visit: Payer: Self-pay | Admitting: Cardiovascular Disease

## 2019-11-11 DIAGNOSIS — Z20822 Contact with and (suspected) exposure to covid-19: Secondary | ICD-10-CM | POA: Diagnosis not present

## 2019-11-11 DIAGNOSIS — G894 Chronic pain syndrome: Secondary | ICD-10-CM | POA: Diagnosis not present

## 2019-11-12 ENCOUNTER — Other Ambulatory Visit: Payer: Self-pay

## 2019-11-12 MED ORDER — ALBUTEROL SULFATE (2.5 MG/3ML) 0.083% IN NEBU: 2.5000 mg | INHALATION_SOLUTION | Freq: Four times a day (QID) | RESPIRATORY_TRACT | 0 refills | Status: DC | PRN

## 2019-11-30 DIAGNOSIS — M5136 Other intervertebral disc degeneration, lumbar region: Secondary | ICD-10-CM | POA: Diagnosis not present

## 2019-11-30 DIAGNOSIS — M5134 Other intervertebral disc degeneration, thoracic region: Secondary | ICD-10-CM | POA: Diagnosis not present

## 2019-11-30 DIAGNOSIS — G894 Chronic pain syndrome: Secondary | ICD-10-CM | POA: Diagnosis not present

## 2019-12-05 ENCOUNTER — Telehealth: Payer: Medicare Other | Admitting: Family

## 2019-12-05 DIAGNOSIS — K146 Glossodynia: Secondary | ICD-10-CM | POA: Diagnosis not present

## 2019-12-05 MED ORDER — MAGIC MOUTHWASH W/LIDOCAINE: 5.0000 mL | Freq: Four times a day (QID) | ORAL | 0 refills | Status: DC | PRN

## 2019-12-05 NOTE — Progress Notes (Signed)
We are sorry that you are not feeling well.  Here is how we plan to help!  Your symptoms indicate a sore tongue from a cut. I have sent in a prescription of magic mouthwash to your pharmacy. Keep your appt with your dentist.    Home Care:  Only take medications as instructed by your medical team.  Do not drink alcohol while taking these medications.  A steam or ultrasonic humidifier can help congestion.  You can place a towel over your head and breathe in the steam from hot water coming from a faucet.  Avoid close contacts especially the very young and the elderly.  Cover your mouth when you cough or sneeze.  Always remember to wash your hands.  Get Help Right Away If:  You develop worsening fever or throat pain.  You develop a severe head ache or visual changes.  Your symptoms persist after you have completed your treatment plan.  Make sure you  Understand these instructions.  Will watch your condition.  Will get help right away if you are not doing well or get worse.  Your e-visit answers were reviewed by a board certified advanced clinical practitioner to complete your personal care plan.  Depending on the condition, your plan could have included both over the counter or prescription medications.  If there is a problem please reply  once you have received a response from your provider.  Your safety is important to [place].  If you have drug allergies check your prescription carefully.    You can use MyChart to ask questions about todays visit, request a non-urgent call back, or ask for a work or school excuse for 24 hours related to this e-Visit. If it has been greater than 24 hours you will need to follow up with your provider, or enter a new e-Visit to address those concerns.  You will get an e-mail in the next two days asking about your experience.  I hope that your e-visit has been valuable and will speed your recovery. Thank you for using e-visits.  Approximately 5  minutes was spent documenting and reviewing patient's chart.

## 2019-12-08 ENCOUNTER — Other Ambulatory Visit: Payer: Self-pay | Admitting: Internal Medicine

## 2019-12-11 DIAGNOSIS — G894 Chronic pain syndrome: Secondary | ICD-10-CM | POA: Diagnosis not present

## 2019-12-13 DIAGNOSIS — M47812 Spondylosis without myelopathy or radiculopathy, cervical region: Secondary | ICD-10-CM | POA: Insufficient documentation

## 2019-12-28 DIAGNOSIS — M5134 Other intervertebral disc degeneration, thoracic region: Secondary | ICD-10-CM | POA: Diagnosis not present

## 2019-12-28 DIAGNOSIS — G894 Chronic pain syndrome: Secondary | ICD-10-CM | POA: Diagnosis not present

## 2019-12-28 DIAGNOSIS — M7061 Trochanteric bursitis, right hip: Secondary | ICD-10-CM | POA: Diagnosis not present

## 2019-12-28 DIAGNOSIS — M7062 Trochanteric bursitis, left hip: Secondary | ICD-10-CM | POA: Diagnosis not present

## 2020-01-10 DIAGNOSIS — G894 Chronic pain syndrome: Secondary | ICD-10-CM | POA: Diagnosis not present

## 2020-01-12 ENCOUNTER — Other Ambulatory Visit: Payer: Self-pay | Admitting: Internal Medicine

## 2020-01-12 MED ORDER — ALBUTEROL SULFATE HFA 108 (90 BASE) MCG/ACT IN AERS: 1.0000 | INHALATION_SPRAY | RESPIRATORY_TRACT | 5 refills | Status: DC | PRN

## 2020-01-12 MED ORDER — GABAPENTIN 300 MG PO CAPS: 300.0000 mg | ORAL_CAPSULE | Freq: Three times a day (TID) | ORAL | 1 refills | Status: DC

## 2020-01-25 ENCOUNTER — Other Ambulatory Visit (INDEPENDENT_AMBULATORY_CARE_PROVIDER_SITE_OTHER): Payer: Medicare Other

## 2020-01-25 DIAGNOSIS — G894 Chronic pain syndrome: Secondary | ICD-10-CM | POA: Diagnosis not present

## 2020-01-25 DIAGNOSIS — D751 Secondary polycythemia: Secondary | ICD-10-CM

## 2020-01-25 DIAGNOSIS — L749 Eccrine sweat disorder, unspecified: Secondary | ICD-10-CM

## 2020-01-25 DIAGNOSIS — R739 Hyperglycemia, unspecified: Secondary | ICD-10-CM | POA: Diagnosis not present

## 2020-01-25 DIAGNOSIS — M5134 Other intervertebral disc degeneration, thoracic region: Secondary | ICD-10-CM | POA: Diagnosis not present

## 2020-01-25 DIAGNOSIS — M7061 Trochanteric bursitis, right hip: Secondary | ICD-10-CM | POA: Diagnosis not present

## 2020-01-25 DIAGNOSIS — M7062 Trochanteric bursitis, left hip: Secondary | ICD-10-CM | POA: Diagnosis not present

## 2020-01-25 LAB — CBC WITH DIFFERENTIAL/PLATELET
Basophils Absolute: 0.1 10*3/uL (ref 0.0–0.1)
Basophils Relative: 0.8 % (ref 0.0–3.0)
Eosinophils Absolute: 0.2 10*3/uL (ref 0.0–0.7)
Eosinophils Relative: 2.4 % (ref 0.0–5.0)
HCT: 45.2 % (ref 36.0–46.0)
Hemoglobin: 15.9 g/dL — ABNORMAL HIGH (ref 12.0–15.0)
Lymphocytes Relative: 35.3 % (ref 12.0–46.0)
Lymphs Abs: 2.6 10*3/uL (ref 0.7–4.0)
MCHC: 35.2 g/dL (ref 30.0–36.0)
MCV: 91.1 fl (ref 78.0–100.0)
Monocytes Absolute: 0.7 10*3/uL (ref 0.1–1.0)
Monocytes Relative: 9.5 % (ref 3.0–12.0)
Neutro Abs: 3.9 10*3/uL (ref 1.4–7.7)
Neutrophils Relative %: 52 % (ref 43.0–77.0)
Platelets: 196 10*3/uL (ref 150.0–400.0)
RBC: 4.97 Mil/uL (ref 3.87–5.11)
RDW: 12.2 % (ref 11.5–15.5)
WBC: 7.4 10*3/uL (ref 4.0–10.5)

## 2020-01-25 LAB — BASIC METABOLIC PANEL
BUN: 11 mg/dL (ref 6–23)
CO2: 27 mEq/L (ref 19–32)
Calcium: 9.9 mg/dL (ref 8.4–10.5)
Chloride: 104 mEq/L (ref 96–112)
Creatinine, Ser: 0.61 mg/dL (ref 0.40–1.20)
GFR: 106.66 mL/min (ref >60.00)
Glucose, Bld: 107 mg/dL — ABNORMAL HIGH (ref 70–99)
Potassium: 4.7 mEq/L (ref 3.5–5.1)
Sodium: 139 mEq/L (ref 135–145)

## 2020-01-25 LAB — HEMOGLOBIN A1C: Hgb A1c MFr Bld: 5.7 % (ref 4.6–6.5)

## 2020-01-26 LAB — FOLLICLE STIMULATING HORMONE: FSH: 59 m[IU]/mL

## 2020-02-02 ENCOUNTER — Ambulatory Visit (INDEPENDENT_AMBULATORY_CARE_PROVIDER_SITE_OTHER): Payer: Medicare Other | Admitting: Internal Medicine

## 2020-02-02 ENCOUNTER — Other Ambulatory Visit: Payer: Medicare Other

## 2020-02-02 ENCOUNTER — Encounter: Payer: Self-pay | Admitting: Internal Medicine

## 2020-02-02 ENCOUNTER — Other Ambulatory Visit: Payer: Self-pay

## 2020-02-02 DIAGNOSIS — J4520 Mild intermittent asthma, uncomplicated: Secondary | ICD-10-CM | POA: Diagnosis not present

## 2020-02-02 DIAGNOSIS — N951 Menopausal and female climacteric states: Secondary | ICD-10-CM

## 2020-02-02 DIAGNOSIS — I471 Supraventricular tachycardia: Secondary | ICD-10-CM

## 2020-02-02 DIAGNOSIS — Z23 Encounter for immunization: Secondary | ICD-10-CM | POA: Diagnosis not present

## 2020-02-02 DIAGNOSIS — F319 Bipolar disorder, unspecified: Secondary | ICD-10-CM | POA: Diagnosis not present

## 2020-02-02 MED ORDER — ESTRADIOL 0.025 MG/24HR TD PTWK
0.0250 mg | MEDICATED_PATCH | TRANSDERMAL | 5 refills | Status: DC
Start: 2020-02-02 — End: 2020-03-23

## 2020-02-02 MED ORDER — HYOSCYAMINE SULFATE 0.125 MG PO TABS
0.1250 mg | ORAL_TABLET | ORAL | 3 refills | Status: DC | PRN
Start: 2020-02-02 — End: 2021-12-14

## 2020-02-02 NOTE — Assessment & Plan Note (Addendum)
Severe new hot flashes.  FSH is in menopausal range. Options to treat were discussed (antidepressants, anticholinergics and hormone replacement therapy).  She would like to try HRT. We discussed potential risks for breast cancer, blood clots etc. We will try Climara patch at low-dose She needs to switch to vaping or discontinue cigarettes ASAP

## 2020-02-02 NOTE — Progress Notes (Signed)
Subjective:  Patient ID: [name], female    DOB: 07-Jan-1971  Age: 50 y.o. MRN: [id number]  CC: Follow-up (Hormonal issues)   HPI BLIMIE BACHAND presents for hot flashes - severe x 6 months. 1 ppd smoker - switching to vaping Follow-up on bipolar disorder, asthma   Outpatient Medications Prior to Visit  Medication Sig Dispense Refill  . albuterol (PROAIR HFA) 108 (90 Base) MCG/ACT inhaler Inhale 1-2 puffs into the lungs every 4 (four) hours as needed for wheezing or shortness of breath. 8.5 each 5  . albuterol (PROVENTIL) (2.5 MG/3ML) 0.083% nebulizer solution Take 3 mLs (2.5 mg total) by nebulization every 6 (six) hours as needed. For shortness of breath. 75 mL 0  . albuterol (PROVENTIL) (2.5 MG/3ML) 0.083% nebulizer solution Take 3 mLs (2.5 mg total) by nebulization every 6 (six) hours as needed. For shortness of breath. 75 mL 0  . amphetamine-dextroamphetamine (ADDERALL) 10 MG tablet Take 10 mg by mouth daily.  0  . aspirin (BAYER ASPIRIN) 325 MG tablet Take 1 tablet (325 mg total) by mouth daily. 100 tablet 3  . atorvastatin (LIPITOR) 20 MG tablet TAKE 1 TABLET BY MOUTH  DAILY 90 tablet 3  . azithromycin (ZITHROMAX Z-PAK) 250 MG tablet Take 1 tablet (250 mg total) by mouth daily. Take 2 tablets on the first day of treatment. Then take 1 tablet per day for the next four days. 6 tablet 0  . budesonide-formoterol (SYMBICORT) 160-4.5 MCG/ACT inhaler INHALE 2 PUFFS BY MOUTH TWICE A DAY 10.2 Inhaler 5  . cyclobenzaprine (FLEXERIL) 10 MG tablet TAKE 1/2 - 1 TABLET BY MOUTH DAILY OR TWICE DAILY IF TOLERATED  0  . famotidine (PEPCID) 40 MG tablet TAKE 1 TABLET BY MOUTH  DAILY 90 tablet 3  . gabapentin (NEURONTIN) 300 MG capsule Take 1 capsule (300 mg total) by mouth 3 (three) times daily. 270 capsule 1  . HYDROcodone-acetaminophen (NORCO/VICODIN) 5-325 MG tablet Take 1-2 tablets by mouth every 4 (four) hours as needed for moderate pain. (Patient taking differently: Take 1-2 tablets by  mouth every 4 (four) hours as needed for moderate pain. 1/2-1 tablet every day PRN, Moderate pain) 30 tablet 0  . linaclotide (LINZESS) 145 MCG CAPS capsule Take 1-2 capsules (145-290 mcg total) by mouth daily as needed. 180 capsule 3  . meloxicam (MOBIC) 15 MG tablet TAKE 1 TABLET BY MOUTH  DAILY 90 tablet 3  . metoprolol tartrate (LOPRESSOR) 100 MG tablet TAKE 1 TABLET BY MOUTH  TWICE DAILY 180 tablet 3  . nortriptyline (PAMELOR) 25 MG capsule TAKE 1 CAPSULE BY MOUTH AT  BEDTIME 90 capsule 3  . pantoprazole (PROTONIX) 40 MG tablet TAKE 1 TABLET BY MOUTH  DAILY 90 tablet 3  . Rhubarb (ESTROVEN COMPLETE) 4 MG TABS Take 4 mg by mouth daily.    . traZODone (DESYREL) 100 MG tablet Take 100 mg by mouth at bedtime.    . Asenapine Maleate (SAPHRIS) 10 MG SUBL Place under the tongue at bedtime. (Patient not taking: Reported on 09/23/2019)    . magic mouthwash w/lidocaine SOLN Take 5 mLs by mouth 4 (four) times daily as needed for mouth pain. 200 mL 0   No facility-administered medications prior to visit.    ROS: Review of Systems  Constitutional: Positive for diaphoresis. Negative for activity change, appetite change, chills, fatigue and unexpected weight change.  HENT: Negative for congestion, mouth sores and sinus pressure.   Eyes: Negative for visual disturbance.  Respiratory: Negative for cough and  chest tightness.   Gastrointestinal: Negative for abdominal pain and nausea.  Genitourinary: Negative for difficulty urinating, frequency and vaginal pain.  Musculoskeletal: Negative for back pain and gait problem.  Skin: Negative for pallor and rash.  Neurological: Negative for dizziness, tremors, weakness, numbness and headaches.  Psychiatric/Behavioral: Positive for dysphoric mood. Negative for confusion and sleep disturbance.    Objective:  BP 118/62 (BP Location: Left Arm)   Pulse 73   Temp 98.5 F (36.9 C) (Oral)   Wt 187 lb 3.2 oz (84.9 kg)   SpO2 95%   BMI 30.21 kg/m   BP Readings  from Last 3 Encounters:  02/02/20 118/62  09/23/19 102/70  08/30/19 124/76    Wt Readings from Last 3 Encounters:  02/02/20 187 lb 3.2 oz (84.9 kg)  09/23/19 196 lb (88.9 kg)  08/30/19 198 lb (89.8 kg)    Physical Exam Constitutional:      General: She is not in acute distress.    Appearance: She is well-developed. She is obese.  HENT:     Head: Normocephalic.     Right Ear: External ear normal.     Left Ear: External ear normal.     Nose: Nose normal.     Mouth/Throat:     Mouth: Oropharynx is clear and moist.  Eyes:     General:        Right eye: No discharge.        Left eye: No discharge.     Conjunctiva/sclera: Conjunctivae normal.     Pupils: Pupils are equal, round, and reactive to light.  Neck:     Thyroid: No thyromegaly.     Vascular: No JVD.     Trachea: No tracheal deviation.  Cardiovascular:     Rate and Rhythm: Normal rate and regular rhythm.     Heart sounds: Normal heart sounds.  Pulmonary:     Effort: No respiratory distress.     Breath sounds: No stridor. No wheezing.  Abdominal:     General: Bowel sounds are normal. There is no distension.     Palpations: Abdomen is soft. There is no mass.     Tenderness: There is no abdominal tenderness. There is no guarding or rebound.  Musculoskeletal:        General: No tenderness or edema.     Cervical back: Normal range of motion and neck supple.  Lymphadenopathy:     Cervical: No cervical adenopathy.  Skin:    Findings: No erythema or rash.  Neurological:     Cranial Nerves: No cranial nerve deficit.     Motor: No abnormal muscle tone.     Coordination: Coordination normal.     Deep Tendon Reflexes: Reflexes normal.  Psychiatric:        Mood and Affect: Mood and affect normal.        Behavior: Behavior normal.        Thought Content: Thought content normal.        Judgment: Judgment normal.     Lab Results  Component Value Date   WBC 7.4 01/25/2020   HGB 15.9 (H) 01/25/2020   HCT 45.2  01/25/2020   PLT 196.0 01/25/2020   GLUCOSE 107 (H) 01/25/2020   CHOL 97 (L) 05/10/2019   TRIG 177 (H) 05/10/2019   HDL 21 (L) 05/10/2019   LDLDIRECT 121.0 02/26/2018   LDLCALC 46 05/10/2019   ALT 16 02/26/2018   AST 12 02/26/2018   NA 139 01/25/2020   K 4.7 01/25/2020  CL 104 01/25/2020   CREATININE 0.61 01/25/2020   BUN 11 01/25/2020   CO2 27 01/25/2020   TSH 1.50 02/26/2018   INR 1.05 08/29/2014   HGBA1C 5.7 01/25/2020    MM 3D SCREEN BREAST BILATERAL  Result Date: 08/30/2019 CLINICAL DATA:  Screening. EXAM: DIGITAL SCREENING BILATERAL MAMMOGRAM WITH TOMO AND CAD COMPARISON:  Previous exam(s). ACR Breast Density Category b: There are scattered areas of fibroglandular density. FINDINGS: There are no findings suspicious for malignancy. Images were processed with CAD. IMPRESSION: No mammographic evidence of malignancy. A result letter of this screening mammogram will be mailed directly to the patient. RECOMMENDATION: Screening mammogram in one year. (Code:SM-B-01Y) BI-RADS CATEGORY  1: Negative. Electronically Signed   By: Evangeline Dakin M.D.   On: 08/30/2019 16:06    Assessment & Plan:    Walker Kehr, MD

## 2020-02-02 NOTE — Patient Instructions (Addendum)
Menopause and Hormone Replacement Therapy Menopause is a normal time of life when menstrual periods stop completely and the ovaries stop producing the female hormones estrogen and progesterone. This lack of hormones can affect your health and cause undesirable symptoms. Hormone replacement therapy (HRT) can relieve some of those symptoms. What is hormone replacement therapy? HRT is the use of artificial (synthetic) hormones to replace hormones that your body has stopped producing because you have reached menopause. What are my options for HRT?  HRT may consist of the synthetic hormones estrogen and progestin, or it may consist of only estrogen (estrogen-only therapy). You and your health care provider will decide which form of HRT is best for you. If you choose to be on HRT and you have a uterus, estrogen and progestin are usually prescribed. Estrogen-only therapy is used for women who do not have a uterus. Possible options for taking HRT include:  Pills.  Patches.  Gels.  Sprays.  Vaginal cream.  Vaginal rings.  Vaginal inserts. The amount of hormone(s) that you take and how long you take the hormone(s) varies according to your health. It is important to:  Begin HRT with the lowest possible dosage.  Stop HRT as soon as your health care provider tells you to stop.  Work with your health care provider so that you feel informed and comfortable with your decisions. What are the benefits of HRT? HRT can reduce the frequency and severity of menopausal symptoms. Benefits of HRT vary according to the kind of symptoms that you have, how severe they are, and your overall health. HRT may help to improve the following symptoms of menopause:  Hot flashes and night sweats. These are sudden feelings of heat that spread over the face and body. The skin may turn red, like a blush. Night sweats are hot flashes that happen while you are sleeping or trying to sleep.  Bone loss (osteoporosis). The  body loses calcium more quickly after menopause, causing the bones to become weaker. This can increase the risk for bone breaks (fractures).  Vaginal dryness. The lining of the vagina can become thin and dry, which can cause pain during sex or cause infection, burning, or itching.  Urinary tract infections.  Urinary incontinence. This is the inability to control when you pass urine.  Irritability.  Short-term memory problems. What are the risks of HRT? Risks of HRT vary depending on your individual health and medical history. Risks of HRT also depend on whether you receive both estrogen and progestin or you receive estrogen only. HRT may increase the risk of:  Spotting. This is when a small amount of blood leaks from the vagina unexpectedly.  Endometrial cancer. This cancer is in the lining of the uterus (endometrium).  Breast cancer.  Increased density of breast tissue. This can make it harder to find breast cancer on a breast X-ray (mammogram).  Stroke.  Heart disease.  Blood clots.  Gallbladder disease.  Liver disease. Risks of HRT can increase if you have any of the following conditions:  Endometrial cancer.  Liver disease.  Heart disease.  Breast cancer.  History of blood clots.  History of stroke. Follow these instructions at home:  Take over-the-counter and prescription medicines only as told by your health care provider.  Get mammograms, pelvic exams, and medical checkups as often as told by your health care provider.  Have Pap tests done as often as told by your health care provider. A Pap test is sometimes called a Pap smear. It   is a screening test that is used to check for signs of cancer of the cervix and vagina. A Pap test can also identify the presence of infection or precancerous changes. Pap tests may be done: ? Every 3 years, starting at age 21. ? Every 5 years, starting after age 30, in combination with testing for human papillomavirus  (HPV). ? More often or less often depending on other medical conditions you have, your age, and other risk factors.  It is up to you to get the results of your Pap test. Ask your health care provider, or the department that is doing the test, when your results will be ready.  Keep all follow-up visits as told by your health care provider. This is important. Contact a health care provider if you have:  Pain or swelling in your legs.  Shortness of breath.  Chest pain.  Lumps or changes in your breasts or armpits.  Slurred speech.  Pain, burning, or bleeding when you urinate.  Unusual vaginal bleeding.  Dizziness or headaches.  Weakness or numbness in any part of your arms or legs.  Pain in your abdomen. Summary  Menopause is a normal time of life when menstrual periods stop completely and the ovaries stop producing the female hormones estrogen and progesterone.  Hormone replacement therapy (HRT) can relieve some of the symptoms of menopause.  HRT can reduce the frequency and severity of menopausal symptoms.  Risks of HRT vary depending on your individual health and medical history. This information is not intended to replace advice given to you by your health care provider. Make sure you discuss any questions you have with your health care provider. Document Revised: 09/16/2017 Document Reviewed: 09/16/2017 Elsevier Patient Education  2020 Elsevier Inc.  

## 2020-02-04 NOTE — Assessment & Plan Note (Signed)
Seems to be controlled.  ProAir as needed.  She should not be smoking or vaping.

## 2020-02-04 NOTE — Assessment & Plan Note (Signed)
Stable on Saphris, Cymbalta, Gabapentin, Vyvance

## 2020-02-04 NOTE — Assessment & Plan Note (Signed)
No recurrence.  Continue with metoprolol.  Treat hot flashes

## 2020-02-08 ENCOUNTER — Telehealth: Payer: Medicare Other | Admitting: Family

## 2020-02-08 DIAGNOSIS — R059 Cough, unspecified: Secondary | ICD-10-CM

## 2020-02-08 MED ORDER — BENZONATATE 100 MG PO CAPS: 100.0000 mg | ORAL_CAPSULE | Freq: Three times a day (TID) | ORAL | 0 refills | Status: DC | PRN

## 2020-02-08 MED ORDER — PREDNISONE 10 MG (21) PO TBPK: ORAL_TABLET | ORAL | 0 refills | Status: DC

## 2020-02-08 MED ORDER — ALBUTEROL SULFATE HFA 108 (90 BASE) MCG/ACT IN AERS: 2.0000 | INHALATION_SPRAY | Freq: Four times a day (QID) | RESPIRATORY_TRACT | 0 refills | Status: DC | PRN

## 2020-02-08 MED ORDER — FLUTICASONE PROPIONATE 50 MCG/ACT NA SUSP: 2.0000 | Freq: Every day | NASAL | 6 refills | Status: DC

## 2020-02-08 NOTE — Progress Notes (Signed)
E-Visit for Corona Virus Screening  Your current symptoms could be consistent with the coronavirus.  Many health care providers can now test patients at their office but not all are.  [place] has multiple testing sites. For information on our [place] testing locations and hours go to [email or web address]  We are enrolling you in our [place] for [place] . Daily you will receive a questionnaire within the [place] website. Our COVID 19 response team will be monitoring your responses daily.  Testing Information: The COVID-19 Community Testing sites are testing BY APPOINTMENT ONLY.  You can schedule online at [email or web address]  If you do not have access to a smart phone or computer you may call [phone] for an appointment.   Additional testing sites in the Community:  . For [facility] Testing sites in [facility]  [email or web address]  . For Pop-up testing sites in [place]  [email or web address]  . For [facility] [email or web address]  . For [facility] testing in [place] and [employer] [email or web address]  . For [facility] testing in [facility]   [email or web address]  For  more information about community testing call [phone]   Please quarantine yourself while awaiting your test results. Please stay home for a minimum of 10 days from the first day of illness with improving symptoms and you have had 24 hours of no fever (without the use of Tylenol (Acetaminophen) Motrin (Ibuprofen) or any fever reducing medication).  Also - Do not get tested prior to returning to work because once you have had a positive test the test can stay  positive for more than a month in some cases.   You should wear a mask or cloth face covering over your nose and mouth if you must be around other people or animals, including pets (even at home). Try to stay at least 6 feet away from other people. This will protect the people around you.  Please continue good preventive care measures, including:  frequent hand-washing, avoid touching your face, cover coughs/sneezes, stay out of crowds and keep a 6 foot distance from others.  COVID-19 is a respiratory illness with symptoms that are similar to the flu. Symptoms are typically mild to moderate, but there have been cases of severe illness and death due to the virus.   The following symptoms may appear 2-14 days after exposure: . Fever . Cough . Shortness of breath or difficulty breathing . Chills . Repeated shaking with chills . Muscle pain . Headache . Sore throat . New loss of taste or smell . Fatigue . Congestion or runny nose . Nausea or vomiting . Diarrhea  Go to the nearest hospital ED for assessment if fever/cough/breathlessness are severe or illness seems like a threat to life.  It is vitally important that if you feel that you have an infection such as this virus or any other virus that you stay home and away from places where you may spread it to others.  You should avoid contact with people age 41 and older.   You can use medication such as A prescription cough medication called Tessalon Perles 100 mg. You may take 1-2 capsules every 8 hours as needed for cough, A prescription inhaler called Albuterol MDI 90 mcg /actuation 2 puffs every 4 hours as needed for shortness of breath, wheezing, cough and A prescription for Fluticasone nasal spray 2 sprays in each nostril one time per day and prednisone dose pack.   You may also take acetaminophen (Tylenol) as needed for fever.  Reduce your risk of any infection by using the same precautions used for avoiding the common cold or flu:  [name] Wash  your hands often with soap and warm water for at least 20 seconds.  If soap and water are not readily available, use an alcohol-based hand sanitizer with at least 60% alcohol.  . If coughing or sneezing, cover your mouth and nose by coughing or sneezing into the elbow areas of your shirt or coat, into a tissue or into your sleeve (not your hands). . Avoid shaking hands with others and consider head nods or verbal greetings only. . Avoid touching your eyes, nose, or mouth with unwashed hands.  . Avoid close contact with people who are sick. . Avoid places or events with large numbers of people in one location, like concerts or sporting events. . Carefully consider travel plans you have or are making. . If you are planning any travel outside or inside the [place], visit the CDC's Travelers' Health webpage for the latest health notices. . If you have some symptoms but not all symptoms, continue to monitor at home and seek medical attention if your symptoms worsen. . If you are having a medical emergency, call 911.  HOME CARE . Only take medications as instructed by your medical team. . Drink plenty of fluids and get plenty of rest. . A steam or ultrasonic humidifier can help if you have congestion.   GET HELP RIGHT AWAY IF YOU HAVE EMERGENCY WARNING SIGNS** FOR COVID-19. If you or someone is showing any of these signs seek emergency medical care immediately. Call 911 or proceed to your closest emergency facility if: . You develop worsening high fever. . Trouble breathing . Bluish lips or face . Persistent pain or pressure in the chest . New confusion . Inability to wake or stay awake . You cough up blood. . Your symptoms become more severe  **This list is not all possible symptoms. Contact your medical provider for any symptoms that are sever or concerning to you.  MAKE SURE YOU   Understand these instructions.  Will watch your condition.  Will get help right away if you are not doing well  or get worse.  Your e-visit answers were reviewed by a board certified advanced clinical practitioner to complete your personal care plan.  Depending on the condition, your plan could have included both over the counter or prescription medications.  If there is a problem please reply once you have received a response from your provider.  Your safety is important to [place].  If you have drug allergies check your prescription carefully.    You can use MyChart to ask questions about today's visit, request a non-urgent call back, or ask for a work or school excuse for 24 hours related to this e-Visit. If it has been greater than 24 hours you will need to follow up with your provider, or enter a new e-Visit to address those concerns. You will get an e-mail in the next two days asking about your experience.  I hope that your e-visit has been valuable and will speed your recovery. Thank you for using e-visits.  Approximately 5 minutes was spent documenting and reviewing patient's chart.

## 2020-02-09 DIAGNOSIS — G894 Chronic pain syndrome: Secondary | ICD-10-CM | POA: Diagnosis not present

## 2020-02-09 MED ORDER — BUDESONIDE-FORMOTEROL FUMARATE 160-4.5 MCG/ACT IN AERO: 2.0000 | INHALATION_SPRAY | Freq: Two times a day (BID) | RESPIRATORY_TRACT | 3 refills | Status: DC

## 2020-02-09 MED ORDER — LINACLOTIDE 145 MCG PO CAPS: 145.0000 ug | ORAL_CAPSULE | Freq: Every day | ORAL | 3 refills | Status: DC | PRN

## 2020-02-09 NOTE — Telephone Encounter (Addendum)
Printed rx & Completed forms place on MD desk . will have MD to sign tomorrow when he return...[name]

## 2020-02-09 NOTE — Telephone Encounter (Signed)
Completed forms. Place on MD to sign.[name]

## 2020-02-16 ENCOUNTER — Other Ambulatory Visit: Payer: Self-pay | Admitting: *Deleted

## 2020-02-16 DIAGNOSIS — R059 Cough, unspecified: Secondary | ICD-10-CM

## 2020-02-16 MED ORDER — FLUTICASONE PROPIONATE 50 MCG/ACT NA SUSP: 2.0000 | Freq: Every day | NASAL | 3 refills | Status: DC

## 2020-02-18 ENCOUNTER — Encounter (INDEPENDENT_AMBULATORY_CARE_PROVIDER_SITE_OTHER): Payer: Self-pay

## 2020-02-18 ENCOUNTER — Telehealth: Payer: Self-pay

## 2020-02-18 NOTE — Telephone Encounter (Signed)
Patient states that she has been on prednisone and it was better but since she is off now symptoms are returning. Patient will follow up with pcp today. Patient advise on symptoms per protocol as follow:    Shortness of breath is the same: continue to monitor at home   If symptoms become severe, i.e. shortness of breath at rest, gasping for air, wheezing, CALL 911 AND SEEK TREATMENT IN THE ED   If cough remains the same or better: continue to treat with over the counter medications. Hard candy or cough drops and drinking warm fluids. Adults can also use honey 2 tsp (10 ML) at bedtime.   HONEY IS NOT RECOMMENDED FOR INFANTS UNDER ONE.   If cough is becoming worse even with the use of over the counter medications and patient is not able to sleep at night, cough becomes productive with sputum that maybe yellow or green in color, contact PCP.

## 2020-02-21 ENCOUNTER — Telehealth: Payer: Self-pay | Admitting: *Deleted

## 2020-02-21 ENCOUNTER — Telehealth: Payer: Medicare Other | Admitting: Emergency Medicine

## 2020-02-21 DIAGNOSIS — R059 Cough, unspecified: Secondary | ICD-10-CM

## 2020-02-21 MED ORDER — AZITHROMYCIN 250 MG PO TABS
ORAL_TABLET | ORAL | 0 refills | Status: DC
Start: 2020-02-21 — End: 2020-03-23

## 2020-02-21 MED ORDER — BENZONATATE 100 MG PO CAPS: 100.0000 mg | ORAL_CAPSULE | Freq: Three times a day (TID) | ORAL | 0 refills | Status: DC | PRN

## 2020-02-21 NOTE — Telephone Encounter (Signed)
Contacted patient to review persistent and worsening symptoms of covid. Shortness of breath is reported as the same, recommended to continue to monitor at home and if symptoms become severe; SOB at rest or wheezing, call 911 or go to ED. Patient reports cough is worse even with Prednisone completed and sputum is now dark green. Instructed patient to contact PCP now. Reviewed appetite and reports is the same. Encouraged to eat bland foods, crackers, soups, applesauce as tolerated. Patient reports she is going to contact PCP today and verbalized understanding of care advise and to call back or go to [facility] or ED if symptoms worsens.

## 2020-02-21 NOTE — Progress Notes (Signed)
We are sorry that you are not feeling well.  Here is how we plan to help!  Based on your presentation I believe you most likely have A cough due to bacteria.  When patients have a fever and a productive cough with a change in color or increased sputum production, we are concerned about bacterial bronchitis.  If left untreated it can progress to pneumonia.  If your symptoms do not improve with your treatment plan it is important that you contact your provider.   I have prescribed Azithromyin 250 mg: two tablets now and then one tablet daily for 4 additonal days    In addition  you may use A prescription cough medication called Tessalon Perles 100mg. You may take 1-2 capsules every 8 hours as needed for your cough.    From your responses in the eVisit questionnaire you describe inflammation in the upper respiratory tract which is causing a significant cough.  This is commonly called Bronchitis and has four common causes:    Allergies  Viral Infections  Acid Reflux  Bacterial Infection Allergies, viruses and acid reflux are treated by controlling symptoms or eliminating the cause. An example might be a cough caused by taking certain blood pressure medications. You stop the cough by changing the medication. Another example might be a cough caused by acid reflux. Controlling the reflux helps control the cough.  USE OF BRONCHODILATOR ("RESCUE") INHALERS: There is a risk from using your bronchodilator too frequently.  The risk is that over-reliance on a medication which only relaxes the muscles surrounding the breathing tubes can reduce the effectiveness of medications prescribed to reduce swelling and congestion of the tubes themselves.  Although you feel brief relief from the bronchodilator inhaler, your asthma may actually be worsening with the tubes becoming more swollen and filled with mucus.  This can delay other crucial treatments, such as oral steroid medications. If you need to use a  bronchodilator inhaler daily, several times per day, you should discuss this with your provider.  There are probably better treatments that could be used to keep your asthma under control.     HOME CARE . Only take medications as instructed by your medical team. . Complete the entire course of an antibiotic. . Drink plenty of fluids and get plenty of rest. . Avoid close contacts especially the very young and the elderly . Cover your mouth if you cough or cough into your sleeve. . Always remember to wash your hands . A steam or ultrasonic humidifier can help congestion.   GET HELP RIGHT AWAY IF: . You develop worsening fever. . You become short of breath . You cough up blood. . Your symptoms persist after you have completed your treatment plan MAKE SURE YOU   Understand these instructions.  Will watch your condition.  Will get help right away if you are not doing well or get worse.  Your e-visit answers were reviewed by a board certified advanced clinical practitioner to complete your personal care plan.  Depending on the condition, your plan could have included both over the counter or prescription medications. If there is a problem please reply  once you have received a response from your provider. Your safety is important to us.  If you have drug allergies check your prescription carefully.    You can use MyChart to ask questions about today's visit, request a non-urgent call back, or ask for a work or school excuse for 24 hours related to this e-Visit. If it   has been greater than 24 hours you will need to follow up with your provider, or enter a new e-Visit to address those concerns. You will get an e-mail in the next two days asking about your experience.  I hope that your e-visit has been valuable and will speed your recovery. Thank you for using e-visits.  Approximately 5 minutes was used in reviewing the patient's chart, questionnaire, prescribing medications, and  documentation.  

## 2020-02-22 DIAGNOSIS — G894 Chronic pain syndrome: Secondary | ICD-10-CM | POA: Diagnosis not present

## 2020-02-22 DIAGNOSIS — M7061 Trochanteric bursitis, right hip: Secondary | ICD-10-CM | POA: Diagnosis not present

## 2020-02-22 DIAGNOSIS — M7062 Trochanteric bursitis, left hip: Secondary | ICD-10-CM | POA: Diagnosis not present

## 2020-02-22 DIAGNOSIS — M546 Pain in thoracic spine: Secondary | ICD-10-CM | POA: Diagnosis not present

## 2020-02-24 NOTE — Telephone Encounter (Signed)
Rec'd fax from [facility] stating they have shipped medication (Symbicort) w/ [employer] tracking # [id number]. Notified pt via mychart w/status.[name]

## 2020-02-26 ENCOUNTER — Other Ambulatory Visit: Payer: Self-pay | Admitting: Internal Medicine

## 2020-02-26 DIAGNOSIS — R059 Cough, unspecified: Secondary | ICD-10-CM

## 2020-02-26 MED ORDER — PREDNISONE 10 MG (21) PO TBPK: ORAL_TABLET | ORAL | 0 refills | Status: DC

## 2020-03-08 DIAGNOSIS — M47812 Spondylosis without myelopathy or radiculopathy, cervical region: Secondary | ICD-10-CM | POA: Diagnosis not present

## 2020-03-10 DIAGNOSIS — M546 Pain in thoracic spine: Secondary | ICD-10-CM | POA: Diagnosis not present

## 2020-03-10 DIAGNOSIS — G894 Chronic pain syndrome: Secondary | ICD-10-CM | POA: Diagnosis not present

## 2020-03-10 DIAGNOSIS — M545 Low back pain, unspecified: Secondary | ICD-10-CM | POA: Diagnosis not present

## 2020-03-10 DIAGNOSIS — M5136 Other intervertebral disc degeneration, lumbar region: Secondary | ICD-10-CM | POA: Diagnosis not present

## 2020-03-21 ENCOUNTER — Other Ambulatory Visit: Payer: Self-pay | Admitting: Internal Medicine

## 2020-03-21 DIAGNOSIS — M5136 Other intervertebral disc degeneration, lumbar region: Secondary | ICD-10-CM | POA: Diagnosis not present

## 2020-03-21 DIAGNOSIS — M546 Pain in thoracic spine: Secondary | ICD-10-CM | POA: Diagnosis not present

## 2020-03-21 DIAGNOSIS — M545 Low back pain, unspecified: Secondary | ICD-10-CM | POA: Diagnosis not present

## 2020-03-21 DIAGNOSIS — G894 Chronic pain syndrome: Secondary | ICD-10-CM | POA: Diagnosis not present

## 2020-03-23 ENCOUNTER — Other Ambulatory Visit: Payer: Self-pay | Admitting: Internal Medicine

## 2020-03-23 DIAGNOSIS — N951 Menopausal and female climacteric states: Secondary | ICD-10-CM

## 2020-03-23 MED ORDER — ESTROGENS CONJUGATED 0.625 MG PO TABS: 0.6250 mg | ORAL_TABLET | Freq: Every day | ORAL | 1 refills | Status: DC

## 2020-03-24 ENCOUNTER — Other Ambulatory Visit: Payer: Self-pay | Admitting: Internal Medicine

## 2020-03-24 MED ORDER — ESTROGENS CONJUGATED 0.625 MG PO TABS: 0.6250 mg | ORAL_TABLET | Freq: Every day | ORAL | 1 refills | Status: DC

## 2020-03-30 ENCOUNTER — Other Ambulatory Visit (INDEPENDENT_AMBULATORY_CARE_PROVIDER_SITE_OTHER): Payer: Medicare Other

## 2020-03-30 DIAGNOSIS — L749 Eccrine sweat disorder, unspecified: Secondary | ICD-10-CM

## 2020-03-30 DIAGNOSIS — R739 Hyperglycemia, unspecified: Secondary | ICD-10-CM | POA: Diagnosis not present

## 2020-03-30 LAB — CBC WITH DIFFERENTIAL/PLATELET
Basophils Absolute: 0.1 10*3/uL (ref 0.0–0.1)
Basophils Relative: 0.9 % (ref 0.0–3.0)
Eosinophils Absolute: 0.1 10*3/uL (ref 0.0–0.7)
Eosinophils Relative: 1.7 % (ref 0.0–5.0)
HCT: 43.2 % (ref 36.0–46.0)
Hemoglobin: 15.3 g/dL — ABNORMAL HIGH (ref 12.0–15.0)
Lymphocytes Relative: 38.2 % (ref 12.0–46.0)
Lymphs Abs: 2.9 10*3/uL (ref 0.7–4.0)
MCHC: 35.5 g/dL (ref 30.0–36.0)
MCV: 91.1 fl (ref 78.0–100.0)
Monocytes Absolute: 0.9 10*3/uL (ref 0.1–1.0)
Monocytes Relative: 11.6 % (ref 3.0–12.0)
Neutro Abs: 3.6 10*3/uL (ref 1.4–7.7)
Neutrophils Relative %: 47.6 % (ref 43.0–77.0)
Platelets: 188 10*3/uL (ref 150.0–400.0)
RBC: 4.74 Mil/uL (ref 3.87–5.11)
RDW: 12.3 % (ref 11.5–15.5)
WBC: 7.6 10*3/uL (ref 4.0–10.5)

## 2020-03-30 LAB — BASIC METABOLIC PANEL
BUN: 12 mg/dL (ref 6–23)
CO2: 28 mEq/L (ref 19–32)
Calcium: 9.5 mg/dL (ref 8.4–10.5)
Chloride: 102 mEq/L (ref 96–112)
Creatinine, Ser: 0.65 mg/dL (ref 0.40–1.20)
GFR: 104.91 mL/min (ref >60.00)
Glucose, Bld: 105 mg/dL — ABNORMAL HIGH (ref 70–99)
Potassium: 4 mEq/L (ref 3.5–5.1)
Sodium: 136 mEq/L (ref 135–145)

## 2020-03-30 LAB — HEMOGLOBIN A1C: Hgb A1c MFr Bld: 5.9 % (ref 4.6–6.5)

## 2020-03-30 NOTE — Addendum Note (Signed)
Addended by: Trenda Moots on: 05/04/5679 12:39 PM   Modules accepted: Orders

## 2020-04-03 ENCOUNTER — Telehealth: Payer: Self-pay | Admitting: *Deleted

## 2020-04-03 MED ORDER — ESTROGENS CONJUGATED 0.625 MG PO TABS: 0.6250 mg | ORAL_TABLET | Freq: Every day | ORAL | 3 refills | Status: DC

## 2020-04-03 NOTE — Telephone Encounter (Signed)
Pt drop off forms for Pt assistance for her Premarin 0.625 mg. Generated rx and gave forms to MD to sign.Marland KitchenJohny Gonzalez

## 2020-04-04 ENCOUNTER — Encounter: Payer: Self-pay | Admitting: *Deleted

## 2020-04-06 NOTE — Telephone Encounter (Signed)
Ok Thx 

## 2020-04-09 DIAGNOSIS — M5136 Other intervertebral disc degeneration, lumbar region: Secondary | ICD-10-CM | POA: Diagnosis not present

## 2020-04-09 DIAGNOSIS — M545 Low back pain, unspecified: Secondary | ICD-10-CM | POA: Diagnosis not present

## 2020-04-09 DIAGNOSIS — M546 Pain in thoracic spine: Secondary | ICD-10-CM | POA: Diagnosis not present

## 2020-04-09 DIAGNOSIS — G894 Chronic pain syndrome: Secondary | ICD-10-CM | POA: Diagnosis not present

## 2020-04-12 DIAGNOSIS — M65311 Trigger thumb, right thumb: Secondary | ICD-10-CM | POA: Diagnosis not present

## 2020-04-12 DIAGNOSIS — M222X1 Patellofemoral disorders, right knee: Secondary | ICD-10-CM | POA: Diagnosis not present

## 2020-04-12 DIAGNOSIS — M222X2 Patellofemoral disorders, left knee: Secondary | ICD-10-CM | POA: Diagnosis not present

## 2020-04-14 NOTE — Telephone Encounter (Signed)
Rec'd approval letter for pt premarin w/ [employer]. Pt is approved until 01/27/21. Notified pt via mychart w/status. Med should arrive here in 7-10 days.[name]

## 2020-04-18 DIAGNOSIS — M546 Pain in thoracic spine: Secondary | ICD-10-CM | POA: Diagnosis not present

## 2020-04-18 DIAGNOSIS — M5136 Other intervertebral disc degeneration, lumbar region: Secondary | ICD-10-CM | POA: Diagnosis not present

## 2020-04-18 DIAGNOSIS — G894 Chronic pain syndrome: Secondary | ICD-10-CM | POA: Diagnosis not present

## 2020-04-18 DIAGNOSIS — M545 Low back pain, unspecified: Secondary | ICD-10-CM | POA: Diagnosis not present

## 2020-04-19 DIAGNOSIS — M47812 Spondylosis without myelopathy or radiculopathy, cervical region: Secondary | ICD-10-CM | POA: Diagnosis not present

## 2020-04-19 NOTE — Telephone Encounter (Signed)
Rec'd pt medication Premarin from [employer] # [id number]. Sent pt msg via mychart letting her medication is ready for pick-up.[name]

## 2020-04-27 ENCOUNTER — Ambulatory Visit: Payer: Medicare Other | Admitting: Internal Medicine

## 2020-05-01 ENCOUNTER — Ambulatory Visit (INDEPENDENT_AMBULATORY_CARE_PROVIDER_SITE_OTHER): Payer: Medicare Other | Admitting: Internal Medicine

## 2020-05-01 ENCOUNTER — Encounter: Payer: Self-pay | Admitting: Internal Medicine

## 2020-05-01 ENCOUNTER — Other Ambulatory Visit: Payer: Self-pay

## 2020-05-01 VITALS — BP 122/90 | HR 78 | Temp 98.7°F | Ht 66.0 in | Wt 185.6 lb

## 2020-05-01 DIAGNOSIS — R0683 Snoring: Secondary | ICD-10-CM | POA: Diagnosis not present

## 2020-05-01 DIAGNOSIS — T22219A Burn of second degree of unspecified forearm, initial encounter: Secondary | ICD-10-CM

## 2020-05-01 DIAGNOSIS — X150XXA Contact with hot stove (kitchen), initial encounter: Secondary | ICD-10-CM | POA: Diagnosis not present

## 2020-05-01 DIAGNOSIS — I2510 Atherosclerotic heart disease of native coronary artery without angina pectoris: Secondary | ICD-10-CM | POA: Diagnosis not present

## 2020-05-01 DIAGNOSIS — T22019A Burn of unspecified degree of unspecified forearm, initial encounter: Secondary | ICD-10-CM | POA: Insufficient documentation

## 2020-05-01 DIAGNOSIS — F319 Bipolar disorder, unspecified: Secondary | ICD-10-CM | POA: Diagnosis not present

## 2020-05-01 DIAGNOSIS — E559 Vitamin D deficiency, unspecified: Secondary | ICD-10-CM | POA: Diagnosis not present

## 2020-05-01 DIAGNOSIS — I2583 Coronary atherosclerosis due to lipid rich plaque: Secondary | ICD-10-CM | POA: Diagnosis not present

## 2020-05-01 DIAGNOSIS — D751 Secondary polycythemia: Secondary | ICD-10-CM | POA: Diagnosis not present

## 2020-05-01 DIAGNOSIS — F17200 Nicotine dependence, unspecified, uncomplicated: Secondary | ICD-10-CM | POA: Diagnosis not present

## 2020-05-01 MED ORDER — MUPIROCIN 2 % EX OINT: TOPICAL_OINTMENT | CUTANEOUS | 0 refills | Status: DC

## 2020-05-01 NOTE — Assessment & Plan Note (Signed)
On Vit D 

## 2020-05-01 NOTE — Assessment & Plan Note (Signed)
ASA, Lipitor 

## 2020-05-01 NOTE — Assessment & Plan Note (Signed)
1 ppd 

## 2020-05-01 NOTE — Assessment & Plan Note (Signed)
On Saphris, Cymbalta, Gabapentin, Vyvance

## 2020-05-01 NOTE — Assessment & Plan Note (Signed)
Pulm ref to r/o OSA

## 2020-05-01 NOTE — Assessment & Plan Note (Signed)
Smoker 1 ppd R/o OSA

## 2020-05-01 NOTE — Assessment & Plan Note (Signed)
Bactroban oint

## 2020-05-01 NOTE — Progress Notes (Signed)
Subjective:  Patient ID: [name], female    DOB: 09/18/1970  Age: 50 y.o. MRN: [id number]  CC: weight loss (Want rx for Plenity. Also have a burn on both arms want MD to look at )   HPI [name] presents for burns on forearms - baking in a commercial oven C/o poss OSA - snoring   Outpatient Medications Prior to Visit  Medication Sig Dispense Refill  . albuterol (PROVENTIL) (2.5 MG/3ML) 0.083% nebulizer solution Take 3 mLs (2.5 mg total) by nebulization every 6 (six) hours as needed. For shortness of breath. 75 mL 0  . albuterol (PROVENTIL) (2.5 MG/3ML) 0.083% nebulizer solution Take 3 mLs (2.5 mg total) by nebulization every 6 (six) hours as needed. For shortness of breath. 75 mL 0  . albuterol (VENTOLIN HFA) 108 (90 Base) MCG/ACT inhaler Inhale 2 puffs into the lungs every 6 (six) hours as needed for wheezing or shortness of breath. 8 g 0  . amphetamine-dextroamphetamine (ADDERALL) 10 MG tablet Take 10 mg by mouth daily.  0  . aspirin (BAYER ASPIRIN) 325 MG tablet Take 1 tablet (325 mg total) by mouth daily. 100 tablet 3  . atorvastatin (LIPITOR) 20 MG tablet TAKE 1 TABLET BY MOUTH  DAILY 90 tablet 3  . budesonide-formoterol (SYMBICORT) 160-4.5 MCG/ACT inhaler Inhale 2 puffs into the lungs 2 (two) times daily. 30.6 each 3  . cyclobenzaprine (FLEXERIL) 10 MG tablet TAKE 1/2 - 1 TABLET BY MOUTH DAILY OR TWICE DAILY IF TOLERATED  0  . estrogens, conjugated, (PREMARIN) 0.625 MG tablet Take 1 tablet (0.625 mg total) by mouth daily. 90 tablet 3  . famotidine (PEPCID) 40 MG tablet TAKE 1 TABLET BY MOUTH  DAILY 90 tablet 3  . fluticasone (FLONASE) 50 MCG/ACT nasal spray Place 2 sprays into both nostrils daily. 48 g 3  . gabapentin (NEURONTIN) 300 MG capsule Take 1 capsule (300 mg total) by mouth 3 (three) times daily. 270 capsule 1  . HYDROcodone-acetaminophen (NORCO/VICODIN) 5-325 MG tablet Take 1-2 tablets by mouth every 4 (four) hours as needed for moderate pain. (Patient  taking differently: Take 1-2 tablets by mouth every 4 (four) hours as needed for moderate pain. 1/2-1 tablet every day PRN, Moderate pain) 30 tablet 0  . linaclotide (LINZESS) 145 MCG CAPS capsule Take 1-2 capsules (145-290 mcg total) by mouth daily as needed. 180 capsule 3  . meloxicam (MOBIC) 15 MG tablet TAKE 1 TABLET BY MOUTH  DAILY 90 tablet 3  . metoprolol tartrate (LOPRESSOR) 100 MG tablet TAKE 1 TABLET BY MOUTH  TWICE DAILY 180 tablet 3  . nortriptyline (PAMELOR) 25 MG capsule TAKE 1 CAPSULE BY MOUTH AT  BEDTIME 90 capsule 3  . pantoprazole (PROTONIX) 40 MG tablet TAKE 1 TABLET BY MOUTH  DAILY 90 tablet 1  . Rhubarb (ESTROVEN COMPLETE) 4 MG TABS Take 4 mg by mouth daily.    . traZODone (DESYREL) 100 MG tablet Take 100 mg by mouth at bedtime.    . hyoscyamine (LEVSIN) 0.125 MG tablet Take 1 tablet (0.125 mg total) by mouth every 4 (four) hours as needed for up to 10 days (hot flash). 100 tablet 3  . benzonatate (TESSALON PERLES) 100 MG capsule Take 1 capsule (100 mg total) by mouth 3 (three) times daily as needed. (Patient not taking: Reported on 05/01/2020) 20 capsule 0  . predniSONE (STERAPRED UNI-PAK 21 TAB) 10 MG (21) TBPK tablet Use as directed (Patient not taking: Reported on 05/01/2020) 21 tablet 0   No  facility-administered medications prior to visit.    ROS: Review of Systems  Constitutional: Negative for activity change, appetite change, chills, fatigue and unexpected weight change.  HENT: Negative for congestion, mouth sores and sinus pressure.   Eyes: Negative for visual disturbance.  Respiratory: Negative for cough and chest tightness.   Gastrointestinal: Negative for abdominal pain and nausea.  Genitourinary: Negative for difficulty urinating, frequency and vaginal pain.  Musculoskeletal: Negative for back pain and gait problem.  Skin: Negative for pallor and rash.  Neurological: Negative for dizziness, tremors, weakness, numbness and headaches.  Psychiatric/Behavioral:  Negative for confusion and sleep disturbance.    Objective:  BP 122/90 (BP Location: Left Arm)   Pulse 78   Temp 98.7 F (37.1 C) (Oral)   Ht 5\' 6"  (1.676 m)   Wt 185 lb 9.6 oz (84.2 kg)   SpO2 97%   BMI 29.96 kg/m   BP Readings from Last 3 Encounters:  05/01/20 122/90  02/02/20 118/62  09/23/19 102/70    Wt Readings from Last 3 Encounters:  05/01/20 185 lb 9.6 oz (84.2 kg)  02/02/20 187 lb 3.2 oz (84.9 kg)  09/23/19 196 lb (88.9 kg)    Physical Exam Constitutional:      General: She is not in acute distress.    Appearance: She is well-developed. She is obese.  HENT:     Head: Normocephalic.     Right Ear: External ear normal.     Left Ear: External ear normal.     Nose: Nose normal.  Eyes:     General:        Right eye: No discharge.        Left eye: No discharge.     Conjunctiva/sclera: Conjunctivae normal.     Pupils: Pupils are equal, round, and reactive to light.  Neck:     Thyroid: No thyromegaly.     Vascular: No JVD.     Trachea: No tracheal deviation.  Cardiovascular:     Rate and Rhythm: Normal rate and regular rhythm.     Heart sounds: Normal heart sounds.  Pulmonary:     Effort: No respiratory distress.     Breath sounds: No stridor. No wheezing.  Abdominal:     General: Bowel sounds are normal. There is no distension.     Palpations: Abdomen is soft. There is no mass.     Tenderness: There is no abdominal tenderness. There is no guarding or rebound.  Musculoskeletal:        General: No tenderness.     Cervical back: Normal range of motion and neck supple.  Lymphadenopathy:     Cervical: No cervical adenopathy.  Skin:    Findings: No erythema or rash.  Neurological:     Mental Status: She is oriented to person, place, and time.     Cranial Nerves: No cranial nerve deficit.     Motor: No abnormal muscle tone.     Coordination: Coordination normal.     Deep Tendon Reflexes: Reflexes normal.  Psychiatric:        Behavior: Behavior normal.         Thought Content: Thought content normal.        Judgment: Judgment normal.   burns on forearms  Lab Results  Component Value Date   WBC 7.6 03/30/2020   HGB 15.3 (H) 03/30/2020   HCT 43.2 03/30/2020   PLT 188.0 03/30/2020   GLUCOSE 105 (H) 03/30/2020   CHOL 97 (L) 05/10/2019   TRIG  177 (H) 05/10/2019   HDL 21 (L) 05/10/2019   LDLDIRECT 121.0 02/26/2018   LDLCALC 46 05/10/2019   ALT 16 02/26/2018   AST 12 02/26/2018   NA 136 03/30/2020   K 4.0 03/30/2020   CL 102 03/30/2020   CREATININE 0.65 03/30/2020   BUN 12 03/30/2020   CO2 28 03/30/2020   TSH 1.50 02/26/2018   INR 1.05 08/29/2014   HGBA1C 5.9 03/30/2020    MM 3D SCREEN BREAST BILATERAL  Result Date: 08/30/2019 CLINICAL DATA:  Screening. EXAM: DIGITAL SCREENING BILATERAL MAMMOGRAM WITH TOMO AND CAD COMPARISON:  Previous exam(s). ACR Breast Density Category b: There are scattered areas of fibroglandular density. FINDINGS: There are no findings suspicious for malignancy. Images were processed with CAD. IMPRESSION: No mammographic evidence of malignancy. A result letter of this screening mammogram will be mailed directly to the patient. RECOMMENDATION: Screening mammogram in one year. (Code:SM-B-01Y) BI-RADS CATEGORY  1: Negative. Electronically Signed   By: Evangeline Dakin M.D.   On: 08/30/2019 16:06    Assessment & Plan:    Walker Kehr, MD

## 2020-05-09 DIAGNOSIS — M545 Low back pain, unspecified: Secondary | ICD-10-CM | POA: Diagnosis not present

## 2020-05-09 DIAGNOSIS — M5136 Other intervertebral disc degeneration, lumbar region: Secondary | ICD-10-CM | POA: Diagnosis not present

## 2020-05-09 DIAGNOSIS — M546 Pain in thoracic spine: Secondary | ICD-10-CM | POA: Diagnosis not present

## 2020-05-09 DIAGNOSIS — G894 Chronic pain syndrome: Secondary | ICD-10-CM | POA: Diagnosis not present

## 2020-05-11 ENCOUNTER — Other Ambulatory Visit: Payer: Self-pay

## 2020-05-16 DIAGNOSIS — M4807 Spinal stenosis, lumbosacral region: Secondary | ICD-10-CM | POA: Diagnosis not present

## 2020-05-16 DIAGNOSIS — G4700 Insomnia, unspecified: Secondary | ICD-10-CM | POA: Diagnosis not present

## 2020-05-16 DIAGNOSIS — M25519 Pain in unspecified shoulder: Secondary | ICD-10-CM | POA: Diagnosis not present

## 2020-05-16 DIAGNOSIS — G894 Chronic pain syndrome: Secondary | ICD-10-CM | POA: Diagnosis not present

## 2020-05-16 DIAGNOSIS — R519 Headache, unspecified: Secondary | ICD-10-CM | POA: Diagnosis not present

## 2020-05-16 DIAGNOSIS — M5136 Other intervertebral disc degeneration, lumbar region: Secondary | ICD-10-CM | POA: Diagnosis not present

## 2020-05-16 DIAGNOSIS — M545 Low back pain, unspecified: Secondary | ICD-10-CM | POA: Diagnosis not present

## 2020-05-16 DIAGNOSIS — M79669 Pain in unspecified lower leg: Secondary | ICD-10-CM | POA: Diagnosis not present

## 2020-05-16 DIAGNOSIS — M25559 Pain in unspecified hip: Secondary | ICD-10-CM | POA: Diagnosis not present

## 2020-05-16 DIAGNOSIS — M4726 Other spondylosis with radiculopathy, lumbar region: Secondary | ICD-10-CM | POA: Diagnosis not present

## 2020-05-16 DIAGNOSIS — G8929 Other chronic pain: Secondary | ICD-10-CM | POA: Diagnosis not present

## 2020-05-16 DIAGNOSIS — M792 Neuralgia and neuritis, unspecified: Secondary | ICD-10-CM | POA: Diagnosis not present

## 2020-05-16 DIAGNOSIS — M546 Pain in thoracic spine: Secondary | ICD-10-CM | POA: Diagnosis not present

## 2020-05-16 DIAGNOSIS — M48062 Spinal stenosis, lumbar region with neurogenic claudication: Secondary | ICD-10-CM | POA: Diagnosis not present

## 2020-05-16 DIAGNOSIS — M47897 Other spondylosis, lumbosacral region: Secondary | ICD-10-CM | POA: Diagnosis not present

## 2020-05-22 DIAGNOSIS — M25561 Pain in right knee: Secondary | ICD-10-CM | POA: Diagnosis not present

## 2020-05-22 DIAGNOSIS — M25562 Pain in left knee: Secondary | ICD-10-CM | POA: Diagnosis not present

## 2020-05-22 DIAGNOSIS — M47816 Spondylosis without myelopathy or radiculopathy, lumbar region: Secondary | ICD-10-CM | POA: Diagnosis not present

## 2020-05-22 DIAGNOSIS — G8929 Other chronic pain: Secondary | ICD-10-CM | POA: Insufficient documentation

## 2020-05-22 DIAGNOSIS — M47812 Spondylosis without myelopathy or radiculopathy, cervical region: Secondary | ICD-10-CM | POA: Diagnosis not present

## 2020-06-07 ENCOUNTER — Other Ambulatory Visit: Payer: Self-pay

## 2020-06-07 ENCOUNTER — Encounter: Payer: Self-pay | Admitting: Pulmonary Disease

## 2020-06-07 ENCOUNTER — Ambulatory Visit (INDEPENDENT_AMBULATORY_CARE_PROVIDER_SITE_OTHER): Payer: Medicare Other | Admitting: Pulmonary Disease

## 2020-06-07 VITALS — BP 118/82 | HR 72 | Temp 98.4°F | Ht 67.5 in | Wt 188.2 lb

## 2020-06-07 DIAGNOSIS — G4734 Idiopathic sleep related nonobstructive alveolar hypoventilation: Secondary | ICD-10-CM

## 2020-06-07 NOTE — Patient Instructions (Addendum)
History of nocturnal desaturations Daytime sleepiness   Your previous study was actually negative-showed 1.9 events an hour-this is usually read as a negative study  We will obtain a home sleep study  Smoking cessation efforts  I will see you in 3 to 4 months  Call with significant concerns Sleep Apnea Sleep apnea affects breathing during sleep. It causes breathing to stop for a short time or to become shallow. It can also increase the risk of:  Heart attack.  Stroke.  Being very overweight (obese).  Diabetes.  Heart failure.  Irregular heartbeat. The goal of treatment is to help you breathe normally again. What are the causes? There are three kinds of sleep apnea:  Obstructive sleep apnea. This is caused by a blocked or collapsed airway.  Central sleep apnea. This happens when the brain does not send the right signals to the muscles that control breathing.  Mixed sleep apnea. This is a combination of obstructive and central sleep apnea. The most common cause of this condition is a collapsed or blocked airway. This can happen if:  Your throat muscles are too relaxed.  Your tongue and tonsils are too large.  You are overweight.  Your airway is too small.   What increases the risk?  Being overweight.  Smoking.  Having a small airway.  Being older.  Being female.  Drinking alcohol.  Taking medicines to calm yourself (sedatives or tranquilizers).  Having family members with the condition. What are the signs or symptoms?  Trouble staying asleep.  Being sleepy or tired during the day.  Getting angry a lot.  Loud snoring.  Headaches in the morning.  Not being able to focus your mind (concentrate).  Forgetting things.  Less interest in sex.  Mood swings.  Personality changes.  Feelings of sadness (depression).  Waking up a lot during the night to pee (urinate).  Dry mouth.  Sore throat. How is this diagnosed?  Your medical  history.  A physical exam.  A test that is done when you are sleeping (sleep study). The test is most often done in a sleep lab but may also be done at home. How is this treated?  Sleeping on your side.  Using a medicine to get rid of mucus in your nose (decongestant).  Avoiding the use of alcohol, medicines to help you relax, or certain pain medicines (narcotics).  Losing weight, if needed.  Changing your diet.  Not smoking.  Using a machine to open your airway while you sleep, such as: ? An oral appliance. This is a mouthpiece that shifts your lower jaw forward. ? A CPAP device. This device blows air through a mask when you breathe out (exhale). ? An EPAP device. This has valves that you put in each nostril. ? A BPAP device. This device blows air through a mask when you breathe in (inhale) and breathe out.  Having surgery if other treatments do not work. It is important to get treatment for sleep apnea. Without treatment, it can lead to:  High blood pressure.  Coronary artery disease.  In men, not being able to have an erection (impotence).  Reduced thinking ability.   Follow these instructions at home: Lifestyle  Make changes that your doctor recommends.  Eat a healthy diet.  Lose weight if needed.  Avoid alcohol, medicines to help you relax, and some pain medicines.  Do not use any products that contain nicotine or tobacco, such as cigarettes, e-cigarettes, and chewing tobacco. If you need help  quitting, ask your doctor. General instructions  Take over-the-counter and prescription medicines only as told by your doctor.  If you were given a machine to use while you sleep, use it only as told by your doctor.  If you are having surgery, make sure to tell your doctor you have sleep apnea. You may need to bring your device with you.  Keep all follow-up visits as told by your doctor. This is important. Contact a doctor if:  The machine that you were given to  use during sleep bothers you or does not seem to be working.  You do not get better.  You get worse. Get help right away if:  Your chest hurts.  You have trouble breathing in enough air.  You have an uncomfortable feeling in your back, arms, or stomach.  You have trouble talking.  One side of your body feels weak.  A part of your face is hanging down. These symptoms may be an emergency. Do not wait to see if the symptoms will go away. Get medical help right away. Call your local emergency services (911 in the [place] Do not drive yourself to the hospital. Summary  This condition affects breathing during sleep.  The most common cause is a collapsed or blocked airway.  The goal of treatment is to help you breathe normally while you sleep. This information is not intended to replace advice given to you by your health care provider. Make sure you discuss any questions you have with your health care provider. Document Revised: 10/31/2017 Document Reviewed: 09/09/2017 Elsevier Patient Education  [name].

## 2020-06-07 NOTE — Progress Notes (Signed)
[name]    [id number]    09-04-1970  Primary Care Physician:Plotnikov, Evie Lacks, MD  Referring Physician: Cassandria Anger, MD [address] [place],  [place] [address]  Chief complaint:   Patient being seen for nocturnal desaturations  HPI:  Monitoring from examined device reveals nocturnal desaturations Admits to history of snoring, no witnessed apneas Usually goes to bed at 9-11 takes a 1 to 2 hours to fall asleep  About 2 awakenings Final wake up time between 630 and 730  She had a sleep study performed in 2014 that showed a negative study-1.9 events an hour  Weight has been relatively stable  Admits to snoring Admits to dry mouth in the mornings No headaches Memory is fine Feels tired during the day Both parents snored Active smoker-1 pack a day  Has degenerative back disease Coronary artery disease, hypertension   Outpatient Encounter Medications as of 06/07/2020  Medication Sig  . albuterol (VENTOLIN HFA) 108 (90 Base) MCG/ACT inhaler Inhale 2 puffs into the lungs every 6 (six) hours as needed for wheezing or shortness of breath.  . amphetamine-dextroamphetamine (ADDERALL) 10 MG tablet Take 10 mg by mouth daily.  [name] aspirin (BAYER ASPIRIN) 325 MG tablet Take 1 tablet (325 mg total) by mouth daily.  [name] atorvastatin (LIPITOR) 20 MG tablet TAKE 1 TABLET BY MOUTH  DAILY  . budesonide-formoterol (SYMBICORT) 160-4.5 MCG/ACT inhaler Inhale 2 puffs into the lungs 2 (two) times daily.  . cyclobenzaprine (FLEXERIL) 10 MG tablet TAKE 1/2 - 1 TABLET BY MOUTH DAILY OR TWICE DAILY IF TOLERATED  . estrogens, conjugated, (PREMARIN) 0.625 MG tablet Take 1 tablet (0.625 mg total) by mouth daily.  . famotidine (PEPCID) 40 MG tablet TAKE 1 TABLET BY MOUTH  DAILY  . fluticasone (FLONASE) 50 MCG/ACT nasal spray Place 2 sprays into both nostrils daily.  [name] gabapentin (NEURONTIN) 300 MG capsule Take 1 capsule (300 mg total) by mouth 3 (three) times daily.  [name]  HYDROcodone-acetaminophen (NORCO/VICODIN) 5-325 MG tablet Take 1-2 tablets by mouth every 4 (four) hours as needed for moderate pain. (Patient taking differently: Take 1-2 tablets by mouth every 4 (four) hours as needed for moderate pain. 1/2-1 tablet every day PRN, Moderate pain)  . linaclotide (LINZESS) 145 MCG CAPS capsule Take 1-2 capsules (145-290 mcg total) by mouth daily as needed.  . meloxicam (MOBIC) 15 MG tablet TAKE 1 TABLET BY MOUTH  DAILY  . metoprolol tartrate (LOPRESSOR) 100 MG tablet TAKE 1 TABLET BY MOUTH  TWICE DAILY  . mupirocin ointment (BACTROBAN) 2 % On burn wounds bid  . nortriptyline (PAMELOR) 25 MG capsule TAKE 1 CAPSULE BY MOUTH AT  BEDTIME  . pantoprazole (PROTONIX) 40 MG tablet TAKE 1 TABLET BY MOUTH  DAILY  . traZODone (DESYREL) 100 MG tablet Take 100 mg by mouth at bedtime.  [name] albuterol (PROVENTIL) (2.5 MG/3ML) 0.083% nebulizer solution Take 3 mLs (2.5 mg total) by nebulization every 6 (six) hours as needed. For shortness of breath. (Patient not taking: Reported on 06/07/2020)  . albuterol (PROVENTIL) (2.5 MG/3ML) 0.083% nebulizer solution Take 3 mLs (2.5 mg total) by nebulization every 6 (six) hours as needed. For shortness of breath. (Patient not taking: Reported on 06/07/2020)  . hyoscyamine (LEVSIN) 0.125 MG tablet Take 1 tablet (0.125 mg total) by mouth every 4 (four) hours as needed for up to 10 days (hot flash).  . Rhubarb (ESTROVEN COMPLETE) 4 MG TABS Take 4 mg by mouth daily. (Patient  not taking: Reported on 06/07/2020)   No facility-administered encounter medications on file as of 06/07/2020.    Allergies as of 06/07/2020 - Review Complete 06/07/2020  Allergen Reaction Noted  . Lisinopril Cough   . Nizoral [ketoconazole]  02/19/2017  . Amoxicillin Itching   . Ancef [cefazolin] Itching 11/22/2014  . Doxycycline Nausea And Vomiting 05/02/2009  . Hydrochlorothiazide Other (See Comments) 11/12/2006  . Penicillins Itching 01/19/2013  . Percocet  [oxycodone-acetaminophen] Itching 05/06/2012  . Talwin [pentazocine] Nausea And Vomiting 05/06/2012    Past Medical History:  Diagnosis Date  . Anxiety state, unspecified   . Arthritis   . Asthma   . Attention deficit disorder without mention of hyperactivity   . Bipolar disorder ([place])   . Constipation    takes Linzess  . Depressive disorder, not elsewhere classified   . Esophageal reflux   . Hypertension   . Migraine without aura, without mention of intractable migraine without mention of status migrainosus   . Pain in joint, site unspecified   . Pneumonia   . Polycythemia    per pt  . Unspecified asthma(493.90)   . Unspecified essential hypertension   . Unspecified vitamin D deficiency     Past Surgical History:  Procedure Laterality Date  . ABDOMINAL HYSTERECTOMY     partial  . abdominal ultrasound  10/12/97  . APPENDECTOMY  05/27/2013   gangrenous  . INCISE AND DRAIN ABCESS  2011   Right axilla- Dr. Barkley Bruns  . KNEE ARTHROSCOPY     both knees  . KNEE ARTHROSCOPY WITH FULKERSON SLIDE Right 10/17/2015   Procedure: KNEE ARTHROSCOPY WITH Elmarie Mainland;  Surgeon: Melrose Nakayama, MD;  Location: [facility];  Service: Orthopedics;  Laterality: Right;  . NM MYOCAR PERF WALL MOTION  11/2009   bruce myoview; perfusion defect in anterior region consistent with breast attenuation; remaining myocardium with normal perfusion; post-stress EF 74%; low risk scan   . ORIF TIBIA FRACTURE Right 11/24/2015   Procedure: OPEN REDUCTION INTERNAL FIXATION (ORIF) TIBIA FRACTURE;  Surgeon: Melrose Nakayama, MD;  Location: [facility];  Service: Orthopedics;  Laterality: Right;  . PARTIAL HYSTERECTOMY  2007  . PLANTAR FASCIA SURGERY  2000  . TONSILLECTOMY  04/29/00  . TONSILLECTOMY Bilateral   . TRANSTHORACIC ECHOCARDIOGRAM  11/2009   EF=>55%; trace MR & TR    Family History  Problem Relation Age of Onset  . Breast cancer Mother   . Multiple sclerosis Mother   . Cancer Mother 71       breast ca  .  Depression Mother   . Lupus Sister        PTSD  . Alcohol abuse Sister   . Bipolar disorder Sister   . Heart disease Father        cardiac arrest  . Alcohol abuse Brother        also HTN, lupus, enlarged heart  . Heart disease Maternal Grandfather        cardiac arrest  . Diabetes Paternal Grandfather   . Heart disease Paternal Grandfather   . Asthma Other        Family history  . Coronary artery disease Other        1st degree female < 50  . Diabetes Other        1st degree relative  . COPD Paternal Grandmother   . ADD / ADHD Child   . Asthma Child     Social History   Socioeconomic History  . Marital status: Married  Spouse name: Not on file  . Number of children: 2  . Years of education: Not on file  . Highest education level: Not on file  Occupational History  . Occupation: HR; waiting table 2 d/wk; back to school; filed for disability; lost her job 2011     Employer: [employer]  Tobacco Use  . Smoking status: Current Every Day Smoker    Packs/day: 1.00    Years: 27.00    Pack years: 27.00    Types: Cigarettes    Start date: 10/31/1986    Last attempt to quit: 11/22/2014    Years since quitting: 5.5  . Smokeless tobacco: Never Used  . Tobacco comment: verified 06/07/20  Vaping Use  . Vaping Use: Never used  Substance and Sexual Activity  . Alcohol use: No    Alcohol/week: 0.0 standard drinks  . Drug use: No  . Sexual activity: Yes    Birth control/protection: None  Other Topics Concern  . Not on file  Social History Narrative   Patient lives at home with her mother.   Disabled   Right handed   Caffeine three cups daily   Some college education   Social Determinants of Health   Financial Resource Strain: Not on file  Food Insecurity: Not on file  Transportation Needs: Not on file  Physical Activity: Not on file  Stress: Not on file  Social Connections: Not on file  Intimate Partner Violence: Not on file    Review of Systems  Constitutional:  Negative for fatigue.  Respiratory: Negative for shortness of breath.   Psychiatric/Behavioral: Positive for sleep disturbance.    Vitals:   06/07/20 1056  BP: 118/82  Pulse: 72  Temp: 98.4 F (36.9 C)  SpO2: 97%     Physical Exam Constitutional:      Appearance: She is obese.  HENT:     Head: Normocephalic.     Nose: No congestion.     Mouth/Throat:     Mouth: Mucous membranes are moist.  Eyes:     General:        Right eye: No discharge.        Left eye: No discharge.  Cardiovascular:     Rate and Rhythm: Normal rate and regular rhythm.     Heart sounds: No murmur heard. No friction rub.  Pulmonary:     Effort: No respiratory distress.     Breath sounds: No stridor. No wheezing or rhonchi.  Musculoskeletal:     Cervical back: No rigidity or tenderness.  Neurological:     Mental Status: She is alert.  Psychiatric:        Mood and Affect: Mood normal.     Results of the Epworth flowsheet 06/07/2020  Sitting and reading 2  Watching TV 2  Sitting, inactive in a public place (e.g. a theatre or a meeting) 1  As a passenger in a car for an hour without a break 1  Lying down to rest in the afternoon when circumstances permit 1  Sitting and talking to someone 0  Sitting quietly after a lunch without alcohol 0  In a car, while stopped for a few minutes in traffic 0  Total score 7    Data Reviewed: Sleep study in 2014 reviewed showing AHI of 1.9  Printout she has shows nocturnal desaturations  Assessment:  Concern for obstructive sleep apnea  Daytime sleepiness  Pathophysiology of sleep disordered breathing discussed  Treatment options discussed  Active smoker Plan/Recommendations: Schedule  patient for home sleep study  Smoking cessation counseling  Tentative follow-up in 3 to 4 months following sleep study  Encouraged to call with any significant concerns   Sherrilyn Rist MD [facility] 06/07/2020, 11:27 AM  CC:  Plotnikov, Evie Lacks, MD

## 2020-06-16 ENCOUNTER — Other Ambulatory Visit: Payer: Self-pay | Admitting: Internal Medicine

## 2020-06-21 DIAGNOSIS — M545 Low back pain, unspecified: Secondary | ICD-10-CM | POA: Diagnosis not present

## 2020-06-21 DIAGNOSIS — M546 Pain in thoracic spine: Secondary | ICD-10-CM | POA: Diagnosis not present

## 2020-06-21 DIAGNOSIS — G894 Chronic pain syndrome: Secondary | ICD-10-CM | POA: Diagnosis not present

## 2020-06-21 DIAGNOSIS — M5136 Other intervertebral disc degeneration, lumbar region: Secondary | ICD-10-CM | POA: Diagnosis not present

## 2020-06-22 ENCOUNTER — Other Ambulatory Visit: Payer: Self-pay | Admitting: Internal Medicine

## 2020-06-23 ENCOUNTER — Telehealth: Payer: Self-pay | Admitting: *Deleted

## 2020-06-23 NOTE — Telephone Encounter (Signed)
Rec'd pt Symbicort inhaler from [facility]. Lot # [id number] w/ order # [id number]. Sent pt mg via mychart informing her med is ready for pick-up.[name]

## 2020-06-29 ENCOUNTER — Other Ambulatory Visit (INDEPENDENT_AMBULATORY_CARE_PROVIDER_SITE_OTHER): Payer: Medicare Other

## 2020-06-29 DIAGNOSIS — D751 Secondary polycythemia: Secondary | ICD-10-CM

## 2020-06-29 LAB — CBC WITH DIFFERENTIAL/PLATELET
Basophils Absolute: 0 10*3/uL (ref 0.0–0.1)
Basophils Relative: 0.8 % (ref 0.0–3.0)
Eosinophils Absolute: 0.1 10*3/uL (ref 0.0–0.7)
Eosinophils Relative: 2.1 % (ref 0.0–5.0)
HCT: 43.2 % (ref 36.0–46.0)
Hemoglobin: 15 g/dL (ref 12.0–15.0)
Lymphocytes Relative: 34.9 % (ref 12.0–46.0)
Lymphs Abs: 2 10*3/uL (ref 0.7–4.0)
MCHC: 34.8 g/dL (ref 30.0–36.0)
MCV: 92.1 fl (ref 78.0–100.0)
Monocytes Absolute: 0.6 10*3/uL (ref 0.1–1.0)
Monocytes Relative: 10.5 % (ref 3.0–12.0)
Neutro Abs: 2.9 10*3/uL (ref 1.4–7.7)
Neutrophils Relative %: 51.7 % (ref 43.0–77.0)
Platelets: 175 10*3/uL (ref 150.0–400.0)
RBC: 4.69 Mil/uL (ref 3.87–5.11)
RDW: 12.2 % (ref 11.5–15.5)
WBC: 5.7 10*3/uL (ref 4.0–10.5)

## 2020-07-08 DIAGNOSIS — G894 Chronic pain syndrome: Secondary | ICD-10-CM | POA: Diagnosis not present

## 2020-07-11 ENCOUNTER — Ambulatory Visit: Payer: Medicare Other

## 2020-07-11 ENCOUNTER — Other Ambulatory Visit: Payer: Self-pay

## 2020-07-11 DIAGNOSIS — M545 Low back pain, unspecified: Secondary | ICD-10-CM | POA: Diagnosis not present

## 2020-07-11 DIAGNOSIS — G4734 Idiopathic sleep related nonobstructive alveolar hypoventilation: Secondary | ICD-10-CM

## 2020-07-11 DIAGNOSIS — G894 Chronic pain syndrome: Secondary | ICD-10-CM | POA: Diagnosis not present

## 2020-07-11 DIAGNOSIS — M546 Pain in thoracic spine: Secondary | ICD-10-CM | POA: Diagnosis not present

## 2020-07-11 DIAGNOSIS — M5136 Other intervertebral disc degeneration, lumbar region: Secondary | ICD-10-CM | POA: Diagnosis not present

## 2020-07-18 DIAGNOSIS — M47816 Spondylosis without myelopathy or radiculopathy, lumbar region: Secondary | ICD-10-CM | POA: Diagnosis not present

## 2020-07-24 NOTE — Telephone Encounter (Signed)
[facility] [phone]. Spoke w/rep Ysidro Evert gave pt information.. Member ID # [id number], order placed. He states med should arrive  in 7-10 days. Next order will be Sept 3rd...[name]

## 2020-07-25 ENCOUNTER — Other Ambulatory Visit: Payer: Self-pay

## 2020-07-25 ENCOUNTER — Ambulatory Visit: Payer: Medicare Other

## 2020-07-26 ENCOUNTER — Telehealth: Payer: Self-pay | Admitting: Pulmonary Disease

## 2020-07-26 DIAGNOSIS — R0683 Snoring: Secondary | ICD-10-CM

## 2020-07-26 NOTE — Telephone Encounter (Signed)
Pt has attempted a HST 2x- 6/14 and 6/28. Both only read for 30 min. Pt states she woke up and the machine was off due to turning during her sleep. Pt states she is open to doing an in lab sleep study.   Please advise.

## 2020-07-28 NOTE — Telephone Encounter (Signed)
In lab sleep study order placed.

## 2020-07-28 NOTE — Telephone Encounter (Signed)
Sched for 9/8 at [facility]. LVM for pt and mailed letter. Gave to [place] to precert.

## 2020-07-28 NOTE — Telephone Encounter (Signed)
Kindly schedule patient for an in lab sleep study

## 2020-08-07 DIAGNOSIS — G894 Chronic pain syndrome: Secondary | ICD-10-CM | POA: Diagnosis not present

## 2020-08-07 DIAGNOSIS — M5136 Other intervertebral disc degeneration, lumbar region: Secondary | ICD-10-CM | POA: Diagnosis not present

## 2020-08-07 DIAGNOSIS — M546 Pain in thoracic spine: Secondary | ICD-10-CM | POA: Diagnosis not present

## 2020-08-07 DIAGNOSIS — M545 Low back pain, unspecified: Secondary | ICD-10-CM | POA: Diagnosis not present

## 2020-08-08 DIAGNOSIS — G894 Chronic pain syndrome: Secondary | ICD-10-CM | POA: Diagnosis not present

## 2020-08-08 DIAGNOSIS — M546 Pain in thoracic spine: Secondary | ICD-10-CM | POA: Diagnosis not present

## 2020-08-08 DIAGNOSIS — M545 Low back pain, unspecified: Secondary | ICD-10-CM | POA: Diagnosis not present

## 2020-08-08 DIAGNOSIS — M5136 Other intervertebral disc degeneration, lumbar region: Secondary | ICD-10-CM | POA: Diagnosis not present

## 2020-08-11 NOTE — Telephone Encounter (Signed)
Placed order for [place] w/ [facility] [phone] via automatic service. Order complete should receive in 7-10 days. Notified pt w/status.Marland KitchenJohny Gonzalez

## 2020-08-18 ENCOUNTER — Encounter: Payer: Self-pay | Admitting: Cardiovascular Disease

## 2020-08-18 ENCOUNTER — Ambulatory Visit: Payer: Medicare Other | Admitting: Cardiovascular Disease

## 2020-08-18 ENCOUNTER — Other Ambulatory Visit: Payer: Self-pay

## 2020-08-18 VITALS — BP 112/80 | HR 73 | Ht 65.0 in | Wt 190.0 lb

## 2020-08-18 DIAGNOSIS — E785 Hyperlipidemia, unspecified: Secondary | ICD-10-CM

## 2020-08-18 DIAGNOSIS — D45 Polycythemia vera: Secondary | ICD-10-CM

## 2020-08-18 DIAGNOSIS — I2510 Atherosclerotic heart disease of native coronary artery without angina pectoris: Secondary | ICD-10-CM | POA: Diagnosis not present

## 2020-08-18 DIAGNOSIS — I10 Essential (primary) hypertension: Secondary | ICD-10-CM

## 2020-08-18 DIAGNOSIS — F17200 Nicotine dependence, unspecified, uncomplicated: Secondary | ICD-10-CM

## 2020-08-18 LAB — LIPID PANEL
Chol/HDL Ratio: 4.7 ratio — ABNORMAL HIGH (ref 0.0–4.4)
Cholesterol, Total: 165 mg/dL (ref 100–199)
HDL: 35 mg/dL — ABNORMAL LOW (ref >39)
LDL Chol Calc (NIH): 104 mg/dL — ABNORMAL HIGH (ref 0–99)
Triglycerides: 145 mg/dL (ref 0–149)
VLDL Cholesterol Cal: 26 mg/dL (ref 5–40)

## 2020-08-18 NOTE — Patient Instructions (Signed)
Medication Instructions:  No changes *If you need a refill on your cardiac medications before your next appointment, please call your pharmacy*   Lab Work: Your provider would like for you to have the following labs today: fasting Lipid  If you have labs (blood work) drawn today and your tests are completely normal, you will receive your results only by: . MyChart Message (if you have MyChart) OR . A paper copy in the mail If you have any lab test that is abnormal or we need to change your treatment, we will call you to review the results.   Testing/Procedures: None ordered   Follow-Up: At [facility], you and your health needs are our priority.  As part of our continuing mission to provide you with exceptional heart care, we have created designated Provider Care Teams.  These Care Teams include your primary Cardiologist (physician) and Advanced Practice Providers (APPs -  Physician Assistants and Nurse Practitioners) who all work together to provide you with the care you need, when you need it.  We recommend signing up for the patient portal called "MyChart".  Sign up information is provided on this After Visit Summary.  MyChart is used to connect with patients for Virtual Visits (Telemedicine).  Patients are able to view lab/test results, encounter notes, upcoming appointments, etc.  Non-urgent messages can be sent to your provider as well.   To learn more about what you can do with MyChart, go to [email or web address].    Your next appointment:   12 month(s)  The format for your next appointment:   In Person  Provider:   You may see Mihai Croitoru, MD or one of the following Advanced Practice Providers on your designated Care Team:    Hao Meng, PA-C  Angela Duke, PA-C or   Krista Kroeger, PA-C     

## 2020-08-18 NOTE — Progress Notes (Signed)
Cardiology Office Note:    Date:  08/19/2020   ID:  [name], DOB 1970/06/12, MRN [id number]  PCP:  Cassandria Anger, MD  Cardiologist:  Sanda Klein, MD  Electrophysiologist:  None   Referring MD: Cassandria Anger, MD   Chief Complaint  Patient presents with   Coronary Artery Disease    History of Present Illness:    [name] is a 50 y.o. female smoker with a hx of essential hypertension, symptomatic sinus tachycardia responsive to beta-blockers, possible polycythemia vera managed with phlebotomy, migraines, adult ADD, dyslipidemia and a strong family history of premature coronary artery disease, and a personal history of severely elevated coronary calcium score (215, 99th percentile) and aortic atherosclerosis.  A few weeks ago she had about 3 days where she developed bilateral lower extremity edema shortness of breath but the issue resolved spontaneously without particular treatment.  She does not recall over indulging in salt at the time.  She remains very physically active , sometimes her phone will show more than 15,000 steps a day, although the averages about 7000 steps a day.  She has not had chest pain at rest or with activity and denies orthopnea, PND palpitations, claudication, focal neurological complaints.  She had a normal nuclear perfusion study in June 2021.  Even before getting on the treadmill, she had spontaneous sinus tachycardia and had marked ST segment changes, but EKGs in  normal sinus rhythm showed no evidence of repolarization abnormalities.  She and her husband continue to try to quit smoking.  They try almost monthly but repeatedly failed.  Glycemic control is good with a hemoglobin A1c of 5.9% in March 2022.  Her most recent previous labs showed a marked improvement in her LDL cholesterol down to 46, but today's labs show an LDL of 104.  She claims full compliance with the atorvastatin.  She is scheduled for a full sleep study on  September 8, ordered by Dr. Ander Slade.  Both [name]'s parents had diabetes mellitus.  Her father had early onset CAD, had bypass surgery at age 80 and died at age 48.  Both her grandfathers died in their 45s.   Past Medical History:  Diagnosis Date   Anxiety state, unspecified    Arthritis    Asthma    Attention deficit disorder without mention of hyperactivity    Bipolar disorder ([place])    Constipation    takes Linzess   Depressive disorder, not elsewhere classified    Esophageal reflux    Hypertension    Migraine without aura, without mention of intractable migraine without mention of status migrainosus    Pain in joint, site unspecified    Pneumonia    Polycythemia    per pt   Unspecified asthma(493.90)    Unspecified essential hypertension    Unspecified vitamin D deficiency     Past Surgical History:  Procedure Laterality Date   ABDOMINAL HYSTERECTOMY     partial   abdominal ultrasound  10/12/97   APPENDECTOMY  05/27/2013   gangrenous   INCISE AND DRAIN ABCESS  2011   Right axilla- Dr. Barkley Bruns   KNEE ARTHROSCOPY     both knees   KNEE ARTHROSCOPY WITH FULKERSON SLIDE Right 10/17/2015   Procedure: KNEE ARTHROSCOPY WITH Elmarie Mainland;  Surgeon: Melrose Nakayama, MD;  Location: [facility];  Service: Orthopedics;  Laterality: Right;   NM MYOCAR PERF WALL MOTION  11/2009   bruce myoview; perfusion defect in anterior region consistent with breast attenuation; remaining myocardium  with normal perfusion; post-stress EF 74%; low risk scan    ORIF TIBIA FRACTURE Right 11/24/2015   Procedure: OPEN REDUCTION INTERNAL FIXATION (ORIF) TIBIA FRACTURE;  Surgeon: Melrose Nakayama, MD;  Location: [facility];  Service: Orthopedics;  Laterality: Right;   PARTIAL HYSTERECTOMY  2007   PLANTAR FASCIA SURGERY  2000   TONSILLECTOMY  04/29/00   TONSILLECTOMY Bilateral    TRANSTHORACIC ECHOCARDIOGRAM  11/2009   EF=>55%; trace MR & TR    Current Medications: Current Meds  Medication Sig   albuterol  (VENTOLIN HFA) 108 (90 Base) MCG/ACT inhaler Inhale 2 puffs into the lungs every 6 (six) hours as needed for wheezing or shortness of breath.   amphetamine-dextroamphetamine (ADDERALL) 10 MG tablet Take 10 mg by mouth daily.   aspirin (BAYER ASPIRIN) 325 MG tablet Take 1 tablet (325 mg total) by mouth daily.   atorvastatin (LIPITOR) 20 MG tablet TAKE 1 TABLET BY MOUTH  DAILY   budesonide-formoterol (SYMBICORT) 160-4.5 MCG/ACT inhaler Inhale 2 puffs into the lungs 2 (two) times daily.   cyclobenzaprine (FLEXERIL) 10 MG tablet TAKE 1/2 - 1 TABLET BY MOUTH DAILY OR TWICE DAILY IF TOLERATED   estrogens, conjugated, (PREMARIN) 0.625 MG tablet Take 1 tablet (0.625 mg total) by mouth daily.   famotidine (PEPCID) 40 MG tablet TAKE 1 TABLET BY MOUTH  DAILY   fluticasone (FLONASE) 50 MCG/ACT nasal spray Place 2 sprays into both nostrils daily.   gabapentin (NEURONTIN) 300 MG capsule Take 1 capsule (300 mg total) by mouth 3 (three) times daily.   HYDROcodone-acetaminophen (NORCO/VICODIN) 5-325 MG tablet Take 1-2 tablets by mouth every 4 (four) hours as needed for moderate pain. (Patient taking differently: Take 1-2 tablets by mouth every 4 (four) hours as needed for moderate pain. 1/2-1 tablet every day PRN, Moderate pain)   hyoscyamine (LEVSIN) 0.125 MG tablet Take 1 tablet (0.125 mg total) by mouth every 4 (four) hours as needed for up to 10 days (hot flash).   linaclotide (LINZESS) 145 MCG CAPS capsule Take 1-2 capsules (145-290 mcg total) by mouth daily as needed.   meloxicam (MOBIC) 15 MG tablet TAKE 1 TABLET BY MOUTH  DAILY   metoprolol tartrate (LOPRESSOR) 100 MG tablet TAKE 1 TABLET BY MOUTH  TWICE DAILY   mupirocin ointment (BACTROBAN) 2 % On burn wounds bid   nortriptyline (PAMELOR) 25 MG capsule TAKE 1 CAPSULE BY MOUTH AT  BEDTIME   pantoprazole (PROTONIX) 40 MG tablet TAKE 1 TABLET BY MOUTH  DAILY   traZODone (DESYREL) 100 MG tablet Take 100 mg by mouth at bedtime.     Allergies:   Lisinopril,  Nizoral [ketoconazole], Amoxicillin, Ancef [cefazolin], Doxycycline, Hydrochlorothiazide, Penicillins, Percocet [oxycodone-acetaminophen], and Talwin [pentazocine]   Social History   Socioeconomic History   Marital status: Married    Spouse name: Not on file   Number of children: 2   Years of education: Not on file   Highest education level: Not on file  Occupational History   Occupation: HR; waiting table 2 d/wk; back to school; filed for disability; lost her job 2011     Employer: [employer]  Tobacco Use   Smoking status: Every Day    Packs/day: 1.00    Years: 27.00    Pack years: 27.00    Types: Cigarettes    Start date: 10/31/1986    Last attempt to quit: 11/22/2014    Years since quitting: 5.7   Smokeless tobacco: Never   Tobacco comments:    verified 06/07/20  Vaping Use  Vaping Use: Never used  Substance and Sexual Activity   Alcohol use: No    Alcohol/week: 0.0 standard drinks   Drug use: No   Sexual activity: Yes    Birth control/protection: None  Other Topics Concern   Not on file  Social History Narrative   Patient lives at home with her mother.   Disabled   Right handed   Caffeine three cups daily   Some college education   Social Determinants of Health   Financial Resource Strain: Not on file  Food Insecurity: Not on file  Transportation Needs: Not on file  Physical Activity: Not on file  Stress: Not on file  Social Connections: Not on file     Family History: The patient's family history includes ADD / ADHD in her child; Alcohol abuse in her brother and sister; Asthma in her child and another family member; Bipolar disorder in her sister; Breast cancer in her mother; COPD in her paternal grandmother; Cancer (age of onset: 70) in her mother; Coronary artery disease in an other family member; Depression in her mother; Diabetes in her paternal grandfather and another family member; Heart disease in her father, maternal grandfather, and paternal  grandfather; Lupus in her sister; Multiple sclerosis in her mother.  ROS:   Please see the history of present illness.    All other systems are reviewed and are negative.   EKGs/Labs/Other Studies Reviewed:    The following studies were reviewed today: Calcium score CT scan 11/02/2018 FINDINGS: Non-cardiac: See separate report from [facility]. Ascending Aorta: Normal caliber with trace aortic root calcification. Pericardium: Normal. Coronary arteries: Normal origins. IMPRESSION: Coronary calcium score of 215. This was 99th percentile for age and sex matched control. Echocardiogram 2015 (normal) Left ventricle: The cavity size was normal. Systolic  function was normal. The estimated ejection fraction was in  the range of 60% to 65%. Wall motion was normal; there were  no regional wall motion abnormalities. Left ventricular  diastolic function parameters were normal.  07/21/2019 nuclear stress test The left ventricular ejection fraction is normal (55-65%). Nuclear stress EF: 58%. There was no ST segment deviation noted during stress. The perfusion study is normal. The TID is elevated at 1.34 which could be suggestive of transient ischemic dilatation which can be seen in multivessel CAD. This is new from scan of 2011 (TID 0.94). This is a low risk study.  EKG:  EKG is  ordered today.  Personally reviewed, shows normal sinus rhythm, questionable left atrial abnormality, no repolarization changes, QTC 389 ms.  Recent Labs: 03/30/2020: BUN 12; Creatinine, Ser 0.65; Potassium 4.0; Sodium 136 06/29/2020: Hemoglobin 15.0; Platelets 175.0  Recent Lipid Panel    Component Value Date/Time   CHOL 165 08/18/2020 1006   TRIG 145 08/18/2020 1006   HDL 35 (L) 08/18/2020 1006   CHOLHDL 4.7 (H) 08/18/2020 1006   CHOLHDL 6 02/26/2018 0816   VLDL 41.8 (H) 02/26/2018 0816   LDLCALC 104 (H) 08/18/2020 1006   LDLDIRECT 121.0 02/26/2018 0816    Physical Exam:    VS:  BP 112/80 (BP  Location: Left Arm, Patient Position: Sitting, Cuff Size: Normal)   Pulse 73   Ht '5\' 5"'$  (1.651 m)   Wt 190 lb (86.2 kg)   SpO2 98%   BMI 31.62 kg/m     Wt Readings from Last 3 Encounters:  08/18/20 190 lb (86.2 kg)  06/07/20 188 lb 3.2 oz (85.4 kg)  05/01/20 185 lb 9.6 oz (84.2 kg)  General: Alert, oriented x3, no distress, mildly obese Head: no evidence of trauma, PERRL, EOMI, no exophtalmos or lid lag, no myxedema, no xanthelasma; normal ears, nose and oropharynx Neck: normal jugular venous pulsations and no hepatojugular reflux; brisk carotid pulses without delay and no carotid bruits Chest: clear to auscultation, no signs of consolidation by percussion or palpation, normal fremitus, symmetrical and full respiratory excursions Cardiovascular: normal position and quality of the apical impulse, regular rhythm, normal first and second heart sounds, no murmurs, rubs or gallops Abdomen: no tenderness or distention, no masses by palpation, no abnormal pulsatility or arterial bruits, normal bowel sounds, no hepatosplenomegaly Extremities: no clubbing, cyanosis or edema; 2+ radial, ulnar and brachial pulses bilaterally; 2+ right femoral, posterior tibial and dorsalis pedis pulses; 2+ left femoral, posterior tibial and dorsalis pedis pulses; no subclavian or femoral bruits Neurological: grossly nonfocal Psych: Normal mood and affect    ASSESSMENT:    1. Coronary artery disease involving native coronary artery of native heart without angina pectoris   2. Dyslipidemia (high LDL; low HDL)   3. Essential hypertension   4. Polycythemia vera ([place])   5. Smoking     PLAN:    In order of problems listed above:  CAD: Remains asymptomatic.  The focus is on risk factor modification.  Note that she had ST segment changes on ECG with tachycardia and is therefore likely to always have a "false positive" treadmill stress test.  She is on aspirin 325 mg for polycythemia. HLP: She has a typical  metabolic syndrome/insulin resistance lipid profile, at high risk for  progression of vascular disease.  Very low HDL, elevated triglycerides, likely to have small dense LDL with high particle number.  She appeared to have a dramatic improvement in LDL after we started statin, but this year her LDL is up to 104.  We will increase the dose of atorvastatin 40 mg once daily. HTN: Well-controlled.  Beta-blockers have been very helpful in alleviating symptoms of anxiety as well. P.vera: Managed with intermittent phlebotomy.  Aspirin 325 mg once daily.  Hemoglobin most recently 15.0.  She is scheduled for repeat sleep study, but otherwise does not appear to have sufficient lung problems or smoke enough to cause a secondary polycythemia. Smoking cessation:  Encouraged her to continue efforts at smoking cessation.   Medication Adjustments/Labs and Tests Ordered: Current medicines are reviewed at length with the patient today.  Concerns regarding medicines are outlined above.  Orders Placed This Encounter  Procedures   Lipid panel    No orders of the defined types were placed in this encounter.   Patient Instructions  Medication Instructions:  No changes *If you need a refill on your cardiac medications before your next appointment, please call your pharmacy*   Lab Work: Your provider would like for you to have the following labs today: fasting Lipid  If you have labs (blood work) drawn today and your tests are completely normal, you will receive your results only by: [place] (if you have MyChart) OR A paper copy in the mail If you have any lab test that is abnormal or we need to change your treatment, we will call you to review the results.   Testing/Procedures: None ordered   Follow-Up: At [facility], you and your health needs are our priority.  As part of our continuing mission to provide you with exceptional heart care, we have created designated Provider Care Teams.  These  Care Teams include your primary Cardiologist (physician) and Advanced  Practice Providers (APPs -  Physician Assistants and Nurse Practitioners) who all work together to provide you with the care you need, when you need it.  We recommend signing up for the patient portal called "MyChart".  Sign up information is provided on this After Visit Summary.  MyChart is used to connect with patients for Virtual Visits (Telemedicine).  Patients are able to view lab/test results, encounter notes, upcoming appointments, etc.  Non-urgent messages can be sent to your provider as well.   To learn more about what you can do with MyChart, go to [email or web address].    Your next appointment:   12 month(s)  The format for your next appointment:   In Person  Provider:   You may see Sanda Klein, MD or one of the following Advanced Practice Providers on your designated Care Team:   Almyra Deforest, PA-C Fabian Sharp, [place] or  Roby Lofts, PA-C    Signed, Sanda Klein, MD  08/19/2020 3:44 PM    [place]

## 2020-08-19 ENCOUNTER — Encounter: Payer: Self-pay | Admitting: Cardiovascular Disease

## 2020-08-19 ENCOUNTER — Other Ambulatory Visit: Payer: Self-pay | Admitting: Internal Medicine

## 2020-08-21 ENCOUNTER — Other Ambulatory Visit: Payer: Self-pay | Admitting: *Deleted

## 2020-08-21 MED ORDER — ATORVASTATIN CALCIUM 40 MG PO TABS: 40.0000 mg | ORAL_TABLET | Freq: Every day | ORAL | 1 refills | Status: DC

## 2020-08-21 MED ORDER — ATORVASTATIN CALCIUM 40 MG PO TABS: 40.0000 mg | ORAL_TABLET | Freq: Every day | ORAL | 0 refills | Status: DC

## 2020-08-22 DIAGNOSIS — M47816 Spondylosis without myelopathy or radiculopathy, lumbar region: Secondary | ICD-10-CM | POA: Diagnosis not present

## 2020-08-23 DIAGNOSIS — M222X1 Patellofemoral disorders, right knee: Secondary | ICD-10-CM | POA: Diagnosis not present

## 2020-08-23 DIAGNOSIS — M222X2 Patellofemoral disorders, left knee: Secondary | ICD-10-CM | POA: Diagnosis not present

## 2020-08-25 NOTE — Addendum Note (Signed)
Addended by: Orma Render on: 08/25/2020 09:41 AM   Modules accepted: Orders

## 2020-08-30 ENCOUNTER — Other Ambulatory Visit: Payer: Self-pay

## 2020-08-30 ENCOUNTER — Encounter: Payer: Self-pay | Admitting: Internal Medicine

## 2020-08-30 ENCOUNTER — Ambulatory Visit (INDEPENDENT_AMBULATORY_CARE_PROVIDER_SITE_OTHER): Payer: Medicare Other | Admitting: Internal Medicine

## 2020-08-30 ENCOUNTER — Other Ambulatory Visit (INDEPENDENT_AMBULATORY_CARE_PROVIDER_SITE_OTHER): Payer: Medicare Other

## 2020-08-30 VITALS — BP 120/82 | HR 75 | Temp 98.5°F | Ht 65.0 in | Wt 190.2 lb

## 2020-08-30 DIAGNOSIS — R739 Hyperglycemia, unspecified: Secondary | ICD-10-CM | POA: Diagnosis not present

## 2020-08-30 DIAGNOSIS — F902 Attention-deficit hyperactivity disorder, combined type: Secondary | ICD-10-CM

## 2020-08-30 DIAGNOSIS — D751 Secondary polycythemia: Secondary | ICD-10-CM

## 2020-08-30 DIAGNOSIS — I2510 Atherosclerotic heart disease of native coronary artery without angina pectoris: Secondary | ICD-10-CM

## 2020-08-30 DIAGNOSIS — E559 Vitamin D deficiency, unspecified: Secondary | ICD-10-CM

## 2020-08-30 DIAGNOSIS — Z0000 Encounter for general adult medical examination without abnormal findings: Secondary | ICD-10-CM | POA: Diagnosis not present

## 2020-08-30 DIAGNOSIS — I2583 Coronary atherosclerosis due to lipid rich plaque: Secondary | ICD-10-CM

## 2020-08-30 DIAGNOSIS — E785 Hyperlipidemia, unspecified: Secondary | ICD-10-CM

## 2020-08-30 LAB — CBC WITH DIFFERENTIAL/PLATELET
Basophils Absolute: 0.1 10*3/uL (ref 0.0–0.1)
Basophils Relative: 0.8 % (ref 0.0–3.0)
Eosinophils Absolute: 0.3 10*3/uL (ref 0.0–0.7)
Eosinophils Relative: 5.3 % — ABNORMAL HIGH (ref 0.0–5.0)
HCT: 41.8 % (ref 36.0–46.0)
Hemoglobin: 14.8 g/dL (ref 12.0–15.0)
Lymphocytes Relative: 34.1 % (ref 12.0–46.0)
Lymphs Abs: 2.2 10*3/uL (ref 0.7–4.0)
MCHC: 35.4 g/dL (ref 30.0–36.0)
MCV: 91.5 fl (ref 78.0–100.0)
Monocytes Absolute: 0.8 10*3/uL (ref 0.1–1.0)
Monocytes Relative: 13 % — ABNORMAL HIGH (ref 3.0–12.0)
Neutro Abs: 3 10*3/uL (ref 1.4–7.7)
Neutrophils Relative %: 46.8 % (ref 43.0–77.0)
Platelets: 171 10*3/uL (ref 150.0–400.0)
RBC: 4.57 Mil/uL (ref 3.87–5.11)
RDW: 12.7 % (ref 11.5–15.5)
WBC: 6.4 10*3/uL (ref 4.0–10.5)

## 2020-08-30 LAB — BASIC METABOLIC PANEL
BUN: 15 mg/dL (ref 6–23)
CO2: 24 mEq/L (ref 19–32)
Calcium: 9.4 mg/dL (ref 8.4–10.5)
Chloride: 103 mEq/L (ref 96–112)
Creatinine, Ser: 0.61 mg/dL (ref 0.40–1.20)
GFR: 106.21 mL/min (ref >60.00)
Glucose, Bld: 107 mg/dL — ABNORMAL HIGH (ref 70–99)
Potassium: 4 mEq/L (ref 3.5–5.1)
Sodium: 138 mEq/L (ref 135–145)

## 2020-08-30 LAB — HEMOGLOBIN A1C: Hgb A1c MFr Bld: 5.8 % (ref 4.6–6.5)

## 2020-08-30 MED ORDER — VITAMIN D3 50 MCG (2000 UT) PO CAPS: 2000.0000 [IU] | ORAL_CAPSULE | Freq: Every day | ORAL | 3 refills | Status: DC

## 2020-08-30 NOTE — Assessment & Plan Note (Signed)
On Adderall

## 2020-08-30 NOTE — Assessment & Plan Note (Signed)

## 2020-08-30 NOTE — Progress Notes (Signed)
Subjective:  Patient ID: [name], female    DOB: 02-08-1970  Age: 50 y.o. MRN: [id number]  CC: Annual Exam   HPI [name] presents for B hand pain, swelling x2 months F/u polycythemia C/o OA in other joints - On Norco, Gabapentin, Celebrex Not taking Vit D  Outpatient Medications Prior to Visit  Medication Sig Dispense Refill   albuterol (VENTOLIN HFA) 108 (90 Base) MCG/ACT inhaler Inhale 2 puffs into the lungs every 6 (six) hours as needed for wheezing or shortness of breath. 8 g 0   amphetamine-dextroamphetamine (ADDERALL) 10 MG tablet Take 10 mg by mouth daily.  0   aspirin (BAYER ASPIRIN) 325 MG tablet Take 1 tablet (325 mg total) by mouth daily. 100 tablet 3   atorvastatin (LIPITOR) 40 MG tablet Take 1 tablet (40 mg total) by mouth daily. 30 tablet 0   budesonide-formoterol (SYMBICORT) 160-4.5 MCG/ACT inhaler Inhale 2 puffs into the lungs 2 (two) times daily. 30.6 each 3   CELEBREX 200 MG capsule Take 200 mg by mouth daily as needed. TAKE 1 BY MOUTH DAILY AS NEEDED     cyclobenzaprine (FLEXERIL) 10 MG tablet TAKE 1/2 - 1 TABLET BY MOUTH DAILY OR TWICE DAILY IF TOLERATED  0   estrogens, conjugated, (PREMARIN) 0.625 MG tablet Take 1 tablet (0.625 mg total) by mouth daily. 90 tablet 3   famotidine (PEPCID) 40 MG tablet TAKE 1 TABLET BY MOUTH  DAILY 90 tablet 3   fluticasone (FLONASE) 50 MCG/ACT nasal spray Place 2 sprays into both nostrils daily. 48 g 3   gabapentin (NEURONTIN) 300 MG capsule Take 1 capsule (300 mg total) by mouth 3 (three) times daily. 270 capsule 1   HYDROcodone-acetaminophen (NORCO/VICODIN) 5-325 MG tablet Take 1-2 tablets by mouth every 4 (four) hours as needed for moderate pain. (Patient taking differently: Take 1-2 tablets by mouth every 4 (four) hours as needed for moderate pain. 1/2-1 tablet every day PRN, Moderate pain) 30 tablet 0   linaclotide (LINZESS) 145 MCG CAPS capsule Take 1-2 capsules (145-290 mcg total) by mouth daily as needed. 180  capsule 3   metoprolol tartrate (LOPRESSOR) 100 MG tablet TAKE 1 TABLET BY MOUTH  TWICE DAILY 180 tablet 3   mupirocin ointment (BACTROBAN) 2 % On burn wounds bid 30 g 0   nortriptyline (PAMELOR) 25 MG capsule TAKE 1 CAPSULE BY MOUTH AT  BEDTIME Annual appt is due must see provider for future refills 90 capsule 0   pantoprazole (PROTONIX) 40 MG tablet TAKE 1 TABLET BY MOUTH  DAILY 90 tablet 1   traZODone (DESYREL) 100 MG tablet Take 100 mg by mouth at bedtime.     hyoscyamine (LEVSIN) 0.125 MG tablet Take 1 tablet (0.125 mg total) by mouth every 4 (four) hours as needed for up to 10 days (hot flash). 100 tablet 3   meloxicam (MOBIC) 15 MG tablet TAKE 1 TABLET BY MOUTH  DAILY (Patient not taking: Reported on 08/30/2020) 90 tablet 3   No facility-administered medications prior to visit.    ROS: Review of Systems  Constitutional:  Negative for activity change, appetite change, chills, diaphoresis, fatigue, fever and unexpected weight change.  HENT:  Negative for congestion, dental problem, ear pain, hearing loss, mouth sores, postnasal drip, sinus pressure, sneezing, sore throat and voice change.   Eyes:  Negative for pain and visual disturbance.  Respiratory:  Negative for cough, chest tightness, wheezing and stridor.   Cardiovascular:  Negative for chest pain, palpitations and leg swelling.  Gastrointestinal:  Negative for abdominal distention, abdominal pain, blood in stool, nausea, rectal pain and vomiting.  Genitourinary:  Negative for decreased urine volume, difficulty urinating, dysuria, frequency, hematuria, menstrual problem, vaginal bleeding, vaginal discharge and vaginal pain.  Musculoskeletal:  Positive for arthralgias. Negative for back pain, gait problem, joint swelling and neck pain.  Skin:  Negative for color change, rash and wound.  Neurological:  Negative for dizziness, tremors, syncope, speech difficulty, weakness and light-headedness.  Hematological:  Negative for adenopathy.   Psychiatric/Behavioral:  Positive for dysphoric mood. Negative for behavioral problems, confusion, decreased concentration, hallucinations, sleep disturbance and suicidal ideas. The patient is not nervous/anxious and is not hyperactive.    Objective:  BP 120/82 (BP Location: Left Arm)   Pulse 75   Temp 98.5 F (36.9 C) (Oral)   Ht '5\' 5"'$  (1.651 m)   Wt 190 lb 3.2 oz (86.3 kg)   SpO2 95%   BMI 31.65 kg/m   BP Readings from Last 3 Encounters:  08/30/20 120/82  08/18/20 112/80  06/07/20 118/82    Wt Readings from Last 3 Encounters:  08/30/20 190 lb 3.2 oz (86.3 kg)  08/18/20 190 lb (86.2 kg)  06/07/20 188 lb 3.2 oz (85.4 kg)    Physical Exam Constitutional:      General: She is not in acute distress.    Appearance: She is well-developed. She is obese.  HENT:     Head: Normocephalic.     Right Ear: External ear normal.     Left Ear: External ear normal.     Nose: Nose normal.  Eyes:     General:        Right eye: No discharge.        Left eye: No discharge.     Conjunctiva/sclera: Conjunctivae normal.     Pupils: Pupils are equal, round, and reactive to light.  Neck:     Thyroid: No thyromegaly.     Vascular: No JVD.     Trachea: No tracheal deviation.  Cardiovascular:     Rate and Rhythm: Normal rate and regular rhythm.     Heart sounds: Normal heart sounds.  Pulmonary:     Effort: No respiratory distress.     Breath sounds: No stridor. No wheezing.  Abdominal:     General: Bowel sounds are normal. There is no distension.     Palpations: Abdomen is soft. There is no mass.     Tenderness: There is no abdominal tenderness. There is no guarding or rebound.  Musculoskeletal:        General: No tenderness.     Cervical back: Normal range of motion and neck supple. No rigidity.  Lymphadenopathy:     Cervical: No cervical adenopathy.  Skin:    Findings: No erythema or rash.  Neurological:     Mental Status: She is oriented to person, place, and time.     Cranial  Nerves: No cranial nerve deficit.     Motor: No abnormal muscle tone.     Coordination: Coordination normal.     Deep Tendon Reflexes: Reflexes normal.  Psychiatric:        Behavior: Behavior normal.        Thought Content: Thought content normal.        Judgment: Judgment normal.  Hands w/pain  Lab Results  Component Value Date   WBC 5.7 06/29/2020   HGB 15.0 06/29/2020   HCT 43.2 06/29/2020   PLT 175.0 06/29/2020   GLUCOSE 105 (H) 03/30/2020  CHOL 165 08/18/2020   TRIG 145 08/18/2020   HDL 35 (L) 08/18/2020   LDLDIRECT 121.0 02/26/2018   LDLCALC 104 (H) 08/18/2020   ALT 16 02/26/2018   AST 12 02/26/2018   NA 136 03/30/2020   K 4.0 03/30/2020   CL 102 03/30/2020   CREATININE 0.65 03/30/2020   BUN 12 03/30/2020   CO2 28 03/30/2020   TSH 1.50 02/26/2018   INR 1.05 08/29/2014   HGBA1C 5.9 03/30/2020    MM 3D SCREEN BREAST BILATERAL  Result Date: 08/30/2019 CLINICAL DATA:  Screening. EXAM: DIGITAL SCREENING BILATERAL MAMMOGRAM WITH TOMO AND CAD COMPARISON:  Previous exam(s). ACR Breast Density Category b: There are scattered areas of fibroglandular density. FINDINGS: There are no findings suspicious for malignancy. Images were processed with CAD. IMPRESSION: No mammographic evidence of malignancy. A result letter of this screening mammogram will be mailed directly to the patient. RECOMMENDATION: Screening mammogram in one year. (Code:SM-B-01Y) BI-RADS CATEGORY  1: Negative. Electronically Signed   By: Evangeline Dakin M.D.   On: 08/30/2019 16:06    Assessment & Plan:     Walker Kehr, MD

## 2020-08-30 NOTE — Assessment & Plan Note (Signed)
Coronary calcium score of 215.

## 2020-08-30 NOTE — Assessment & Plan Note (Signed)
Re-start Vit D Risks associated with treatment noncompliance were discussed. Compliance was encouraged.  

## 2020-08-31 ENCOUNTER — Ambulatory Visit: Payer: Medicare Other | Admitting: Internal Medicine

## 2020-08-31 ENCOUNTER — Other Ambulatory Visit: Payer: Self-pay | Admitting: Internal Medicine

## 2020-08-31 DIAGNOSIS — Z1231 Encounter for screening mammogram for malignant neoplasm of breast: Secondary | ICD-10-CM

## 2020-09-04 ENCOUNTER — Other Ambulatory Visit: Payer: Self-pay

## 2020-09-05 DIAGNOSIS — G4700 Insomnia, unspecified: Secondary | ICD-10-CM | POA: Diagnosis not present

## 2020-09-05 DIAGNOSIS — M48062 Spinal stenosis, lumbar region with neurogenic claudication: Secondary | ICD-10-CM | POA: Diagnosis not present

## 2020-09-05 DIAGNOSIS — M47897 Other spondylosis, lumbosacral region: Secondary | ICD-10-CM | POA: Diagnosis not present

## 2020-09-05 DIAGNOSIS — M792 Neuralgia and neuritis, unspecified: Secondary | ICD-10-CM | POA: Diagnosis not present

## 2020-09-05 DIAGNOSIS — M79669 Pain in unspecified lower leg: Secondary | ICD-10-CM | POA: Diagnosis not present

## 2020-09-05 DIAGNOSIS — R519 Headache, unspecified: Secondary | ICD-10-CM | POA: Diagnosis not present

## 2020-09-05 DIAGNOSIS — M5136 Other intervertebral disc degeneration, lumbar region: Secondary | ICD-10-CM | POA: Diagnosis not present

## 2020-09-05 DIAGNOSIS — M179 Osteoarthritis of knee, unspecified: Secondary | ICD-10-CM | POA: Diagnosis not present

## 2020-09-05 DIAGNOSIS — G8929 Other chronic pain: Secondary | ICD-10-CM | POA: Diagnosis not present

## 2020-09-05 DIAGNOSIS — M4807 Spinal stenosis, lumbosacral region: Secondary | ICD-10-CM | POA: Diagnosis not present

## 2020-09-05 DIAGNOSIS — M4726 Other spondylosis with radiculopathy, lumbar region: Secondary | ICD-10-CM | POA: Diagnosis not present

## 2020-09-05 DIAGNOSIS — M545 Low back pain, unspecified: Secondary | ICD-10-CM | POA: Diagnosis not present

## 2020-09-05 DIAGNOSIS — M25559 Pain in unspecified hip: Secondary | ICD-10-CM | POA: Diagnosis not present

## 2020-09-05 DIAGNOSIS — M25519 Pain in unspecified shoulder: Secondary | ICD-10-CM | POA: Diagnosis not present

## 2020-09-05 DIAGNOSIS — G894 Chronic pain syndrome: Secondary | ICD-10-CM | POA: Diagnosis not present

## 2020-09-06 ENCOUNTER — Other Ambulatory Visit: Payer: Self-pay | Admitting: Internal Medicine

## 2020-09-06 DIAGNOSIS — M47816 Spondylosis without myelopathy or radiculopathy, lumbar region: Secondary | ICD-10-CM | POA: Diagnosis not present

## 2020-09-06 DIAGNOSIS — M546 Pain in thoracic spine: Secondary | ICD-10-CM | POA: Diagnosis not present

## 2020-09-06 DIAGNOSIS — M545 Low back pain, unspecified: Secondary | ICD-10-CM | POA: Diagnosis not present

## 2020-09-06 DIAGNOSIS — M5136 Other intervertebral disc degeneration, lumbar region: Secondary | ICD-10-CM | POA: Diagnosis not present

## 2020-09-06 DIAGNOSIS — G894 Chronic pain syndrome: Secondary | ICD-10-CM | POA: Diagnosis not present

## 2020-09-13 DIAGNOSIS — M65311 Trigger thumb, right thumb: Secondary | ICD-10-CM | POA: Diagnosis not present

## 2020-09-19 DIAGNOSIS — M25521 Pain in right elbow: Secondary | ICD-10-CM | POA: Diagnosis not present

## 2020-09-19 NOTE — Telephone Encounter (Signed)
Tried calling [place] pt [place] [phone] no one ever answered the line. Was on hold for 10 mins. Will  try to reorder.[name]

## 2020-09-20 MED ORDER — BUDESONIDE-FORMOTEROL FUMARATE 160-4.5 MCG/ACT IN AERO: 2.0000 | INHALATION_SPRAY | Freq: Two times a day (BID) | RESPIRATORY_TRACT | 3 refills | Status: DC

## 2020-09-20 NOTE — Telephone Encounter (Signed)
Tried calling [place] again received msg stating [employer] Pt Assistance Program is going through some administration transitions. The phone line will reopen on 09/27/20. The fax line is still open and can fax prescriptions". Place rx for symbicort and faxed to [employer]. Notified pt via mychart w/ status.[name]

## 2020-09-26 ENCOUNTER — Telehealth: Payer: Self-pay | Admitting: Internal Medicine

## 2020-09-30 NOTE — Telephone Encounter (Signed)
Refill request for Mupirocin ointment

## 2020-10-03 ENCOUNTER — Other Ambulatory Visit: Payer: Self-pay

## 2020-10-03 ENCOUNTER — Ambulatory Visit
Admission: RE | Admit: 2020-10-03 | Discharge: 2020-10-03 | Disposition: A | Discharge status: Home / Self Care | Payer: Medicare Other | Source: Ambulatory Visit

## 2020-10-03 DIAGNOSIS — Z1231 Encounter for screening mammogram for malignant neoplasm of breast: Secondary | ICD-10-CM | POA: Diagnosis not present

## 2020-10-03 MED ORDER — MUPIROCIN 2 % EX OINT: TOPICAL_OINTMENT | CUTANEOUS | 0 refills | Status: DC

## 2020-10-03 NOTE — Telephone Encounter (Signed)
Reviewed chart pt is up-to-date sent refills to [facility].[name]

## 2020-10-04 DIAGNOSIS — G894 Chronic pain syndrome: Secondary | ICD-10-CM | POA: Diagnosis not present

## 2020-10-04 DIAGNOSIS — M545 Low back pain, unspecified: Secondary | ICD-10-CM | POA: Diagnosis not present

## 2020-10-04 DIAGNOSIS — M259 Joint disorder, unspecified: Secondary | ICD-10-CM | POA: Diagnosis not present

## 2020-10-04 DIAGNOSIS — M546 Pain in thoracic spine: Secondary | ICD-10-CM | POA: Diagnosis not present

## 2020-10-05 ENCOUNTER — Other Ambulatory Visit: Payer: Self-pay

## 2020-10-05 ENCOUNTER — Ambulatory Visit (HOSPITAL_BASED_OUTPATIENT_CLINIC_OR_DEPARTMENT_OTHER): Payer: Medicare Other | Attending: Pulmonary Disease | Admitting: Pulmonary Disease

## 2020-10-05 DIAGNOSIS — Z79899 Other long term (current) drug therapy: Secondary | ICD-10-CM | POA: Insufficient documentation

## 2020-10-05 DIAGNOSIS — R400 Somnolence: Secondary | ICD-10-CM | POA: Diagnosis not present

## 2020-10-05 DIAGNOSIS — R0683 Snoring: Secondary | ICD-10-CM | POA: Insufficient documentation

## 2020-10-06 DIAGNOSIS — G894 Chronic pain syndrome: Secondary | ICD-10-CM | POA: Diagnosis not present

## 2020-10-06 DIAGNOSIS — M546 Pain in thoracic spine: Secondary | ICD-10-CM | POA: Diagnosis not present

## 2020-10-06 DIAGNOSIS — M5136 Other intervertebral disc degeneration, lumbar region: Secondary | ICD-10-CM | POA: Diagnosis not present

## 2020-10-10 NOTE — Telephone Encounter (Signed)
AO please advise. Thanks   I cant aee details from sleep study.was eveeything ok?

## 2020-10-11 DIAGNOSIS — M47816 Spondylosis without myelopathy or radiculopathy, lumbar region: Secondary | ICD-10-CM | POA: Diagnosis not present

## 2020-10-12 ENCOUNTER — Telehealth: Payer: Self-pay | Admitting: Pulmonary Disease

## 2020-10-12 NOTE — Telephone Encounter (Signed)
Call patient  Sleep study result  Date of study: 10/05/2020  Impression: Negative study for significant sleep disordered breathing with AHI of 1.4 No significant oxygen desaturations Medications may be contributing to daytime sleepiness  Recommendation: Weight loss efforts as able Optimize sleep hygiene Behavioral modifications including sleeping with the head of the bed elevated, promoting lateral sleep and avoiding supine sleep may help sleep quality.  Follow-up as previously scheduled

## 2020-10-12 NOTE — Telephone Encounter (Signed)
ATC, left VM. 

## 2020-10-12 NOTE — Procedures (Signed)
POLYSOMNOGRAPHY  Last, First: [name], [name] MRN: [id number] Gender: Female Age (years): 76 Weight (lbs): 192 DOB: 11-28-1970 BMI: 32 Primary Care: No PCP Epworth Score: 9 Referring: Laurin Coder MD Technician: Jorge Ny Interpreting: Laurin Coder MD Study Type: NPSG Ordered Study Type: NPSG Study date: 10/05/2020 Location: [place] CLINICAL INFORMATION [name] is a 50 year old Female and was referred to the sleep center for evaluation of G47.80 Other Sleep Disorders. Indications include Fatigue, Non-refreshing Sleep, Snoring.  MEDICATIONS Patient self administered medications include: FLEXERIL, TRAZODONE, METOPROLOL TARTRATE, GABAPENTIN, NORTRIPTYLINE. Medications administered during study include No sleep medicine administered.  SLEEP STUDY TECHNIQUE A multi-channel overnight Polysomnography study was performed. The channels recorded and monitored were central and occipital EEG, electrooculogram (EOG), submentalis EMG (chin), nasal and oral airflow, thoracic and abdominal wall motion, anterior tibialis EMG, snore microphone, electrocardiogram, and a pulse oximetry. TECHNICIAN COMMENTS Comments added by Technician: None Comments added by Scorer: N/A SLEEP ARCHITECTURE The study was initiated at 9:46:16 PM and terminated at 4:37:59 AM. The total recorded time was 411.7 minutes. EEG confirmed total sleep time was 340 minutes yielding a sleep efficiency of 82.6%%. Sleep onset after lights out was 36.6 minutes with a REM latency of 91.5 minutes. The patient spent 2.9%% of the night in stage N1 sleep, 72.1%% in stage N2 sleep, 0.0%% in stage N3 and 25% in REM. Wake after sleep onset (WASO) was 35.1 minutes. The Arousal Index was 10.4/hour. RESPIRATORY PARAMETERS There were a total of 8 respiratory disturbances out of which 5 were apneas ( 0 obstructive, 0 mixed, 5 central) and 3 hypopneas. The apnea/hypopnea index (AHI) was 1.4 events/hour. The central sleep apnea  index was 0.9 events/hour. The REM AHI was 2.8 events/hour and NREM AHI was 0.9 events/hour. The supine AHI was 0.0 events/hour and the non supine AHI was 1.8 supine during 21.02% of sleep. Respiratory disturbances were associated with oxygen desaturation down to a nadir of 86.0% during sleep. The mean oxygen saturation during the study was 92.2%. The cumulative time under 88% oxygen saturation was 5.5 minutes.  LEG MOVEMENT DATA The total leg movements were 20 with a resulting leg movement index of 3.5/hr .Associated arousal with leg movement index was 0.9/hr.  CARDIAC DATA The underlying cardiac rhythm was most consistent with sinus rhythm. Mean heart rate during sleep was 60.8 bpm. Additional rhythm abnormalities include None. IMPRESSIONS - No Significant Obstructive Sleep apnea(OSA) - EKG showed no cardiac abnormalities. - No significant oxygen desaturation's - The patient snored with soft snoring volume. - No significant periodic leg movements(PLMs) during sleep. However, no significant associated arousals.  DIAGNOSIS - Negative study for significant sleep disorder breathing with AHI of 1.4 - Medications may be contributing to daytime sleepiness  RECOMMENDATIONS - Avoid alcohol, sedatives and other CNS depressants that may worsen sleep apnea and disrupt normal sleep architecture. - Sleep hygiene should be reviewed to assess factors that may improve sleep quality. - Weight management and regular exercise should be initiated or continued.  [Electronically signed] 10/12/2020 07:43 AM  Sherrilyn Rist MD NPI: [id number]

## 2020-10-13 ENCOUNTER — Other Ambulatory Visit: Payer: Self-pay | Admitting: *Deleted

## 2020-10-13 MED ORDER — BUDESONIDE-FORMOTEROL FUMARATE 160-4.5 MCG/ACT IN AERO
2.0000 | INHALATION_SPRAY | Freq: Two times a day (BID) | RESPIRATORY_TRACT | 3 refills | Status: DC
Start: 2020-10-13 — End: 2022-08-12

## 2020-10-13 NOTE — Telephone Encounter (Signed)
Read study

## 2020-10-13 NOTE — Telephone Encounter (Signed)
ATC, left VM. 

## 2020-10-13 NOTE — Telephone Encounter (Signed)
Tried calling [facility] to place order for symbicort for pt. No one ever picked up. So I faxed script to [phone]

## 2020-10-18 NOTE — Telephone Encounter (Signed)
ATC, left VM. Will send letter due to multiple attempts to reach patient.

## 2020-10-24 NOTE — Telephone Encounter (Signed)
Notified pt place order for Linzess w/ [facility] @ [phone]. This is the patient last refill and she will have to be re certified for pt assistance program. Meds will arrive 7-10 days...[name]

## 2020-10-31 DIAGNOSIS — M259 Joint disorder, unspecified: Secondary | ICD-10-CM | POA: Diagnosis not present

## 2020-10-31 DIAGNOSIS — G894 Chronic pain syndrome: Secondary | ICD-10-CM | POA: Diagnosis not present

## 2020-10-31 DIAGNOSIS — M5136 Other intervertebral disc degeneration, lumbar region: Secondary | ICD-10-CM | POA: Diagnosis not present

## 2020-10-31 DIAGNOSIS — M545 Low back pain, unspecified: Secondary | ICD-10-CM | POA: Diagnosis not present

## 2020-11-05 DIAGNOSIS — M545 Low back pain, unspecified: Secondary | ICD-10-CM | POA: Diagnosis not present

## 2020-11-05 DIAGNOSIS — G894 Chronic pain syndrome: Secondary | ICD-10-CM | POA: Diagnosis not present

## 2020-11-05 DIAGNOSIS — M5136 Other intervertebral disc degeneration, lumbar region: Secondary | ICD-10-CM | POA: Diagnosis not present

## 2020-11-05 DIAGNOSIS — M546 Pain in thoracic spine: Secondary | ICD-10-CM | POA: Diagnosis not present

## 2020-11-12 ENCOUNTER — Other Ambulatory Visit: Payer: Self-pay | Admitting: Internal Medicine

## 2020-11-21 ENCOUNTER — Other Ambulatory Visit (INDEPENDENT_AMBULATORY_CARE_PROVIDER_SITE_OTHER): Payer: Medicare Other

## 2020-11-21 DIAGNOSIS — D751 Secondary polycythemia: Secondary | ICD-10-CM | POA: Diagnosis not present

## 2020-11-21 DIAGNOSIS — R739 Hyperglycemia, unspecified: Secondary | ICD-10-CM

## 2020-11-21 LAB — BASIC METABOLIC PANEL
BUN: 8 mg/dL (ref 6–23)
CO2: 28 mEq/L (ref 19–32)
Calcium: 9.7 mg/dL (ref 8.4–10.5)
Chloride: 101 mEq/L (ref 96–112)
Creatinine, Ser: 0.63 mg/dL (ref 0.40–1.20)
GFR: 105.22 mL/min (ref >60.00)
Glucose, Bld: 105 mg/dL — ABNORMAL HIGH (ref 70–99)
Potassium: 4.3 mEq/L (ref 3.5–5.1)
Sodium: 136 mEq/L (ref 135–145)

## 2020-11-21 LAB — CBC WITH DIFFERENTIAL/PLATELET
Basophils Absolute: 0.1 10*3/uL (ref 0.0–0.1)
Basophils Relative: 1 % (ref 0.0–3.0)
Eosinophils Absolute: 0.2 10*3/uL (ref 0.0–0.7)
Eosinophils Relative: 2.4 % (ref 0.0–5.0)
HCT: 45.9 % (ref 36.0–46.0)
Hemoglobin: 16 g/dL — ABNORMAL HIGH (ref 12.0–15.0)
Lymphocytes Relative: 35 % (ref 12.0–46.0)
Lymphs Abs: 2.3 10*3/uL (ref 0.7–4.0)
MCHC: 34.8 g/dL (ref 30.0–36.0)
MCV: 93.9 fl (ref 78.0–100.0)
Monocytes Absolute: 0.8 10*3/uL (ref 0.1–1.0)
Monocytes Relative: 12.4 % — ABNORMAL HIGH (ref 3.0–12.0)
Neutro Abs: 3.3 10*3/uL (ref 1.4–7.7)
Neutrophils Relative %: 49.2 % (ref 43.0–77.0)
Platelets: 191 10*3/uL (ref 150.0–400.0)
RBC: 4.89 Mil/uL (ref 3.87–5.11)
RDW: 11.9 % (ref 11.5–15.5)
WBC: 6.7 10*3/uL (ref 4.0–10.5)

## 2020-11-21 LAB — HEMOGLOBIN A1C: Hgb A1c MFr Bld: 5.4 % (ref 4.6–6.5)

## 2020-11-22 ENCOUNTER — Other Ambulatory Visit: Payer: Self-pay | Admitting: Internal Medicine

## 2020-11-22 MED ORDER — SCOPOLAMINE 1 MG/3DAYS TD PT72: 1.0000 | MEDICATED_PATCH | TRANSDERMAL | 0 refills | Status: DC

## 2020-11-28 ENCOUNTER — Other Ambulatory Visit: Payer: Self-pay | Admitting: Cardiovascular Disease

## 2020-11-28 DIAGNOSIS — G894 Chronic pain syndrome: Secondary | ICD-10-CM | POA: Diagnosis not present

## 2020-11-28 DIAGNOSIS — M5136 Other intervertebral disc degeneration, lumbar region: Secondary | ICD-10-CM | POA: Diagnosis not present

## 2020-11-28 DIAGNOSIS — M545 Low back pain, unspecified: Secondary | ICD-10-CM | POA: Diagnosis not present

## 2020-11-28 DIAGNOSIS — M259 Joint disorder, unspecified: Secondary | ICD-10-CM | POA: Diagnosis not present

## 2020-12-01 NOTE — Telephone Encounter (Signed)
[employer] [employer] spoke w/ rep Dionne placed order for Premarin. Pt has only one refill will be process today, and will received 7-10 business day. Confirmation # [id number]. Sent notification to pt per mychart w/ FYI...[name]

## 2020-12-05 DIAGNOSIS — G894 Chronic pain syndrome: Secondary | ICD-10-CM | POA: Diagnosis not present

## 2020-12-05 DIAGNOSIS — M545 Low back pain, unspecified: Secondary | ICD-10-CM | POA: Diagnosis not present

## 2020-12-05 DIAGNOSIS — M546 Pain in thoracic spine: Secondary | ICD-10-CM | POA: Diagnosis not present

## 2020-12-05 DIAGNOSIS — M5136 Other intervertebral disc degeneration, lumbar region: Secondary | ICD-10-CM | POA: Diagnosis not present

## 2020-12-13 ENCOUNTER — Encounter: Payer: Self-pay | Admitting: Internal Medicine

## 2020-12-13 ENCOUNTER — Other Ambulatory Visit: Payer: Self-pay

## 2020-12-13 ENCOUNTER — Ambulatory Visit (INDEPENDENT_AMBULATORY_CARE_PROVIDER_SITE_OTHER): Payer: Medicare Other | Admitting: Internal Medicine

## 2020-12-13 DIAGNOSIS — J019 Acute sinusitis, unspecified: Secondary | ICD-10-CM | POA: Diagnosis not present

## 2020-12-13 DIAGNOSIS — K130 Diseases of lips: Secondary | ICD-10-CM | POA: Diagnosis not present

## 2020-12-13 MED ORDER — AZITHROMYCIN 250 MG PO TABS: ORAL_TABLET | ORAL | 0 refills | Status: DC

## 2020-12-13 NOTE — Assessment & Plan Note (Signed)
L lower lip - faint ?bluish lesion - ?[facility] vs other ENT ref offered

## 2020-12-13 NOTE — Progress Notes (Signed)
Subjective:  Patient ID: [name], female    DOB: January 07, 1971  Age: 50 y.o. MRN: [id number]  CC: Sinusitis (And also have a discolored spot on her lips)   HPI [name] presents for a spot on the lower lip since summer time. C/o sinusitis sx's x 1 week - green d/c. Came back from a cruise. COVID (-)  Outpatient Medications Prior to Visit  Medication Sig Dispense Refill   albuterol (VENTOLIN HFA) 108 (90 Base) MCG/ACT inhaler Inhale 2 puffs into the lungs every 6 (six) hours as needed for wheezing or shortness of breath. 8 g 0   amphetamine-dextroamphetamine (ADDERALL) 10 MG tablet Take 10 mg by mouth daily.  0   aspirin (BAYER ASPIRIN) 325 MG tablet Take 1 tablet (325 mg total) by mouth daily. 100 tablet 3   atorvastatin (LIPITOR) 40 MG tablet TAKE 1 TABLET BY MOUTH  DAILY 90 tablet 3   budesonide-formoterol (SYMBICORT) 160-4.5 MCG/ACT inhaler Inhale 2 puffs into the lungs 2 (two) times daily. FAXED TO [facility] [phone] [id number] 30.6 each 3   CELEBREX 200 MG capsule Take 200 mg by mouth daily as needed. TAKE 1 BY MOUTH DAILY AS NEEDED     Cholecalciferol (VITAMIN D3) 50 MCG (2000 UT) capsule Take 1 capsule (2,000 Units total) by mouth daily. 100 capsule 3   cyclobenzaprine (FLEXERIL) 10 MG tablet TAKE 1/2 - 1 TABLET BY MOUTH DAILY OR TWICE DAILY IF TOLERATED  0   estrogens, conjugated, (PREMARIN) 0.625 MG tablet Take 1 tablet (0.625 mg total) by mouth daily. 90 tablet 3   famotidine (PEPCID) 40 MG tablet TAKE 1 TABLET BY MOUTH  DAILY 90 tablet 3   fluticasone (FLONASE) 50 MCG/ACT nasal spray Place 2 sprays into both nostrils daily. 48 g 3   gabapentin (NEURONTIN) 300 MG capsule Take 1 capsule (300 mg total) by mouth 3 (three) times daily. 270 capsule 1   HYDROcodone-acetaminophen (NORCO/VICODIN) 5-325 MG tablet Take 1-2 tablets by mouth every 4 (four) hours as needed for moderate pain. (Patient taking differently: Take 1-2 tablets by mouth every 4 (four) hours as needed  for moderate pain. 1/2-1 tablet every day PRN, Moderate pain) 30 tablet 0   linaclotide (LINZESS) 145 MCG CAPS capsule Take 1-2 capsules (145-290 mcg total) by mouth daily as needed. 180 capsule 3   metoprolol tartrate (LOPRESSOR) 100 MG tablet TAKE 1 TABLET BY MOUTH  TWICE DAILY 180 tablet 2   nortriptyline (PAMELOR) 25 MG capsule TAKE 1 CAPSULE BY MOUTH AT  BEDTIME 90 capsule 2   pantoprazole (PROTONIX) 40 MG tablet TAKE 1 TABLET BY MOUTH  DAILY 90 tablet 3   traZODone (DESYREL) 100 MG tablet Take 100 mg by mouth at bedtime.     hyoscyamine (LEVSIN) 0.125 MG tablet Take 1 tablet (0.125 mg total) by mouth every 4 (four) hours as needed for up to 10 days (hot flash). 100 tablet 3   mupirocin ointment (BACTROBAN) 2 % On burn wounds bid (Patient not taking: Reported on 12/13/2020) 30 g 0   scopolamine (TRANSDERM-SCOP, 1.5 MG,) 1 MG/3DAYS Place 1 patch (1.5 mg total) onto the skin every 3 (three) days. (Patient not taking: Reported on 12/13/2020) 4 patch 0   No facility-administered medications prior to visit.    ROS: Review of Systems  Constitutional:  Positive for fatigue.  HENT:  Positive for congestion, postnasal drip, rhinorrhea and sinus pain.    Objective:  BP 108/82 (BP Location: Left Arm)   Pulse 88  Temp 98.5 F (36.9 C) (Oral)   Ht 5\' 5"  (1.651 m)   Wt 182 lb 9.6 oz (82.8 kg)   SpO2 96%   BMI 30.39 kg/m   BP Readings from Last 3 Encounters:  12/13/20 108/82  08/30/20 120/82  08/18/20 112/80    Wt Readings from Last 3 Encounters:  12/13/20 182 lb 9.6 oz (82.8 kg)  10/05/20 192 lb (87.1 kg)  08/30/20 190 lb 3.2 oz (86.3 kg)    Physical Exam Constitutional:      General: She is not in acute distress.    Appearance: She is well-developed.  HENT:     Head: Normocephalic.     Right Ear: External ear normal.     Left Ear: External ear normal.     Nose: Nose normal.  Eyes:     General:        Right eye: No discharge.        Left eye: No discharge.      Conjunctiva/sclera: Conjunctivae normal.     Pupils: Pupils are equal, round, and reactive to light.  Neck:     Thyroid: No thyromegaly.     Vascular: No JVD.     Trachea: No tracheal deviation.  Cardiovascular:     Rate and Rhythm: Normal rate and regular rhythm.     Heart sounds: Normal heart sounds.  Pulmonary:     Effort: No respiratory distress.     Breath sounds: No stridor. No wheezing.  Abdominal:     General: Bowel sounds are normal. There is no distension.     Palpations: Abdomen is soft. There is no mass.     Tenderness: There is no abdominal tenderness. There is no guarding or rebound.  Musculoskeletal:        General: No tenderness.     Cervical back: Normal range of motion and neck supple. No rigidity.  Lymphadenopathy:     Cervical: No cervical adenopathy.  Skin:    Findings: No erythema or rash.  Neurological:     Mental Status: She is oriented to person, place, and time.     Cranial Nerves: No cranial nerve deficit.     Motor: No abnormal muscle tone.     Coordination: Coordination normal.     Deep Tendon Reflexes: Reflexes normal.  Psychiatric:        Behavior: Behavior normal.        Thought Content: Thought content normal.        Judgment: Judgment normal.   L lower lip - faint ?bluish lesion 3x4 mm - ?[facility] [place] vs other  Lab Results  Component Value Date   WBC 6.7 11/21/2020   HGB 16.0 (H) 11/21/2020   HCT 45.9 11/21/2020   PLT 191.0 11/21/2020   GLUCOSE 105 (H) 11/21/2020   CHOL 165 08/18/2020   TRIG 145 08/18/2020   HDL 35 (L) 08/18/2020   LDLDIRECT 121.0 02/26/2018   LDLCALC 104 (H) 08/18/2020   ALT 16 02/26/2018   AST 12 02/26/2018   NA 136 11/21/2020   K 4.3 11/21/2020   CL 101 11/21/2020   CREATININE 0.63 11/21/2020   BUN 8 11/21/2020   CO2 28 11/21/2020   TSH 1.50 02/26/2018   INR 1.05 08/29/2014   HGBA1C 5.4 11/21/2020    MM 3D SCREEN BREAST BILATERAL  Result Date: 10/04/2020 CLINICAL DATA:  Screening. EXAM: DIGITAL  SCREENING BILATERAL MAMMOGRAM WITH TOMOSYNTHESIS AND CAD TECHNIQUE: Bilateral screening digital craniocaudal and mediolateral oblique mammograms were obtained. Bilateral screening  digital breast tomosynthesis was performed. The images were evaluated with computer-aided detection. COMPARISON:  Previous exam(s). ACR Breast Density Category b: There are scattered areas of fibroglandular density. FINDINGS: There are no findings suspicious for malignancy. IMPRESSION: No mammographic evidence of malignancy. A result letter of this screening mammogram will be mailed directly to the patient. RECOMMENDATION: Screening mammogram in one year. (Code:SM-B-01Y) BI-RADS CATEGORY  1: Negative. Electronically Signed   By: Nolon Nations M.D.   On: 10/04/2020 09:22    Assessment & Plan:   Problem List Items Addressed This Visit     Acute sinusitis    New Z pack , Afrin, Flonase      Relevant Medications   azithromycin (ZITHROMAX Z-PAK) 250 MG tablet   Lip lesion    L lower lip - faint ?bluish lesion - ?[facility] vs other ENT ref offered         Meds ordered this encounter  Medications   azithromycin (ZITHROMAX Z-PAK) 250 MG tablet    Sig: As directed    Dispense:  6 tablet    Refill:  0      Follow-up: Return in about 3 months (around 03/15/2021) for a follow-up visit.  Walker Kehr, MD

## 2020-12-13 NOTE — Patient Instructions (Addendum)
L lower lip - ?[facility] [place] vs other

## 2020-12-13 NOTE — Assessment & Plan Note (Signed)
New Z pack , Afrin, Flonase

## 2020-12-14 ENCOUNTER — Other Ambulatory Visit: Payer: Self-pay | Admitting: Internal Medicine

## 2020-12-26 DIAGNOSIS — G4700 Insomnia, unspecified: Secondary | ICD-10-CM | POA: Diagnosis not present

## 2020-12-26 DIAGNOSIS — M25559 Pain in unspecified hip: Secondary | ICD-10-CM | POA: Diagnosis not present

## 2020-12-26 DIAGNOSIS — G894 Chronic pain syndrome: Secondary | ICD-10-CM | POA: Diagnosis not present

## 2020-12-26 DIAGNOSIS — M546 Pain in thoracic spine: Secondary | ICD-10-CM | POA: Diagnosis not present

## 2020-12-26 DIAGNOSIS — R519 Headache, unspecified: Secondary | ICD-10-CM | POA: Diagnosis not present

## 2020-12-26 DIAGNOSIS — M5136 Other intervertebral disc degeneration, lumbar region: Secondary | ICD-10-CM | POA: Diagnosis not present

## 2020-12-26 DIAGNOSIS — M792 Neuralgia and neuritis, unspecified: Secondary | ICD-10-CM | POA: Diagnosis not present

## 2020-12-26 DIAGNOSIS — Z8249 Family history of ischemic heart disease and other diseases of the circulatory system: Secondary | ICD-10-CM | POA: Diagnosis not present

## 2020-12-26 DIAGNOSIS — M48062 Spinal stenosis, lumbar region with neurogenic claudication: Secondary | ICD-10-CM | POA: Diagnosis not present

## 2020-12-26 DIAGNOSIS — M545 Low back pain, unspecified: Secondary | ICD-10-CM | POA: Diagnosis not present

## 2020-12-26 DIAGNOSIS — M47897 Other spondylosis, lumbosacral region: Secondary | ICD-10-CM | POA: Diagnosis not present

## 2020-12-26 DIAGNOSIS — G8929 Other chronic pain: Secondary | ICD-10-CM | POA: Diagnosis not present

## 2020-12-27 ENCOUNTER — Ambulatory Visit (INDEPENDENT_AMBULATORY_CARE_PROVIDER_SITE_OTHER): Payer: Medicare Other | Admitting: Internal Medicine

## 2020-12-27 ENCOUNTER — Other Ambulatory Visit: Payer: Self-pay

## 2020-12-27 ENCOUNTER — Encounter: Payer: Self-pay | Admitting: Internal Medicine

## 2020-12-27 VITALS — BP 118/60 | HR 61 | Temp 98.1°F | Ht 65.0 in | Wt 185.0 lb

## 2020-12-27 DIAGNOSIS — I10 Essential (primary) hypertension: Secondary | ICD-10-CM | POA: Diagnosis not present

## 2020-12-27 DIAGNOSIS — J309 Allergic rhinitis, unspecified: Secondary | ICD-10-CM | POA: Diagnosis not present

## 2020-12-27 DIAGNOSIS — F32A Depression, unspecified: Secondary | ICD-10-CM | POA: Diagnosis not present

## 2020-12-27 DIAGNOSIS — M549 Dorsalgia, unspecified: Secondary | ICD-10-CM | POA: Insufficient documentation

## 2020-12-27 DIAGNOSIS — M545 Low back pain, unspecified: Secondary | ICD-10-CM | POA: Diagnosis not present

## 2020-12-27 DIAGNOSIS — M65311 Trigger thumb, right thumb: Secondary | ICD-10-CM | POA: Diagnosis not present

## 2020-12-27 LAB — URINALYSIS, ROUTINE W REFLEX MICROSCOPIC
Bilirubin Urine: NEGATIVE
Hgb urine dipstick: NEGATIVE
Ketones, ur: NEGATIVE
Leukocytes,Ua: NEGATIVE
Nitrite: NEGATIVE
RBC / HPF: NONE SEEN (ref 0–?)
Specific Gravity, Urine: <=1.005 — AB (ref 1.000–1.030)
Total Protein, Urine: NEGATIVE
Urine Glucose: NEGATIVE
Urobilinogen, UA: 0.2 (ref 0.0–1.0)
pH: 5.5 (ref 5.0–8.0)

## 2020-12-27 MED ORDER — CELEBREX 200 MG PO CAPS: 200.0000 mg | ORAL_CAPSULE | Freq: Two times a day (BID) | ORAL | 1 refills | Status: DC

## 2020-12-27 NOTE — Progress Notes (Signed)
Patient ID: [name], female   DOB: [birth year], 50 y.o.   MRN: [id number]        Chief Complaint: follow up low back pain now more low left lateral side pain, and right ear popping       HPI:  [name] is a 50 y.o. female with hx of chronic lbp not looking for further medication change or refill, but has 1 mo worsening left lower back pain in particular similar to prior pain that was severe and now fortunately improved, but has a slightly different pain to the left lower lateral side now without swelling, rash, trauma and wondering if this might be a different problem that needs to be looked at.  This pain is sharp, intemittent, mild to mod, no radiation, worse to twist about and get up, and not associated with worsening GU or GI symptoms.   Also incidentally with 1 wk onset worsening right  ear popping and crackling, and Does have several wks ongoing nasal allergy symptoms with clearish congestion, itch and sneezing, without fever, pain, ST, cough, swelling or wheezing.  Does not take her allergy med very often. Denies worsening depressive symptoms, suicidal ideation, or panic; has ongoing anxiety       Wt Readings from Last 3 Encounters:  12/27/20 185 lb (83.9 kg)  12/13/20 182 lb 9.6 oz (82.8 kg)  10/05/20 192 lb (87.1 kg)   BP Readings from Last 3 Encounters:  12/27/20 118/60  12/13/20 108/82  08/30/20 120/82         Past Medical History:  Diagnosis Date   Anxiety state, unspecified    Arthritis    Asthma    Attention deficit disorder without mention of hyperactivity    Bipolar disorder ([place])    Constipation    takes Linzess   Depressive disorder, not elsewhere classified    Esophageal reflux    Hypertension    Migraine without aura, without mention of intractable migraine without mention of status migrainosus    Pain in joint, site unspecified    Pneumonia    Polycythemia    per pt   Unspecified asthma(493.90)    Unspecified essential hypertension     Unspecified vitamin D deficiency    Past Surgical History:  Procedure Laterality Date   ABDOMINAL HYSTERECTOMY     partial   abdominal ultrasound  10/12/97   APPENDECTOMY  05/27/2013   gangrenous   INCISE AND DRAIN ABCESS  2011   Right axilla- Dr. Barkley Bruns   KNEE ARTHROSCOPY     both knees   KNEE ARTHROSCOPY WITH FULKERSON SLIDE Right 10/17/2015   Procedure: KNEE ARTHROSCOPY WITH Elmarie Mainland;  Surgeon: Melrose Nakayama, MD;  Location: [place];  Service: Orthopedics;  Laterality: Right;   NM MYOCAR PERF WALL MOTION  11/2009   bruce myoview; perfusion defect in anterior region consistent with breast attenuation; remaining myocardium with normal perfusion; post-stress EF 74%; low risk scan    ORIF TIBIA FRACTURE Right 11/24/2015   Procedure: OPEN REDUCTION INTERNAL FIXATION (ORIF) TIBIA FRACTURE;  Surgeon: Melrose Nakayama, MD;  Location: [facility];  Service: Orthopedics;  Laterality: Right;   PARTIAL HYSTERECTOMY  2007   PLANTAR FASCIA SURGERY  2000   TONSILLECTOMY  04/29/00   TONSILLECTOMY Bilateral    TRANSTHORACIC ECHOCARDIOGRAM  11/2009   EF=>55%; trace MR & TR    reports that she has been smoking cigarettes. She started smoking about 34 years ago. She has a 27.00 pack-year smoking history. She has never used  smokeless tobacco. She reports that she does not drink alcohol and does not use drugs. family history includes ADD / ADHD in her child; Alcohol abuse in her brother and sister; Asthma in her child and another family member; Bipolar disorder in her sister; Breast cancer in her mother; COPD in her paternal grandmother; Cancer (age of onset: 83) in her mother; Coronary artery disease in an other family member; Depression in her mother; Diabetes in her paternal grandfather and another family member; Heart disease in her father, maternal grandfather, and paternal grandfather; Lupus in her sister; Multiple sclerosis in her mother. Allergies  Allergen Reactions   Lisinopril Cough   Nizoral  [Ketoconazole]     Felt drunk w/tablets   Amoxicillin Itching    REACTION: QUESTIONABLE   Ancef [Cefazolin] Itching   Doxycycline Nausea And Vomiting   Hydrochlorothiazide Other (See Comments)    CRAMPS AT HIGHER DOSAGES   Penicillins Itching    Has patient had a PCN reaction causing immediate rash, facial/tongue/throat swelling, SOB or lightheadedness with hypotension: No Has patient had a PCN reaction causing severe rash involving mucus membranes or skin necrosis: No Has patient had a PCN reaction that required hospitalization No Has patient had a PCN reaction occurring within the last 10 years: No If all of the above answers are "NO", then may proceed with Cephalosporin use.    Percocet [Oxycodone-Acetaminophen] Itching   Talwin [Pentazocine] Nausea And Vomiting   Current Outpatient Medications on File Prior to Visit  Medication Sig Dispense Refill   albuterol (VENTOLIN HFA) 108 (90 Base) MCG/ACT inhaler Inhale 2 puffs into the lungs every 6 (six) hours as needed for wheezing or shortness of breath. 8 g 0   amphetamine-dextroamphetamine (ADDERALL) 10 MG tablet Take 10 mg by mouth daily.  0   aspirin (BAYER ASPIRIN) 325 MG tablet Take 1 tablet (325 mg total) by mouth daily. 100 tablet 3   atorvastatin (LIPITOR) 40 MG tablet TAKE 1 TABLET BY MOUTH  DAILY 90 tablet 3   budesonide-formoterol (SYMBICORT) 160-4.5 MCG/ACT inhaler Inhale 2 puffs into the lungs 2 (two) times daily. FAXED TO [facility] [phone] [id number] 30.6 each 3   Cholecalciferol (VITAMIN D3) 50 MCG (2000 UT) capsule Take 1 capsule (2,000 Units total) by mouth daily. 100 capsule 3   cyclobenzaprine (FLEXERIL) 10 MG tablet TAKE 1/2 - 1 TABLET BY MOUTH DAILY OR TWICE DAILY IF TOLERATED  0   estrogens, conjugated, (PREMARIN) 0.625 MG tablet Take 1 tablet (0.625 mg total) by mouth daily. 90 tablet 3   famotidine (PEPCID) 40 MG tablet TAKE 1 TABLET BY MOUTH  DAILY 90 tablet 3   fluticasone (FLONASE) 50 MCG/ACT nasal spray  Place 2 sprays into both nostrils daily. 48 g 3   gabapentin (NEURONTIN) 300 MG capsule Take 1 capsule (300 mg total) by mouth 3 (three) times daily. 270 capsule 1   HYDROcodone-acetaminophen (NORCO/VICODIN) 5-325 MG tablet Take 1-2 tablets by mouth every 4 (four) hours as needed for moderate pain. (Patient taking differently: Take 1-2 tablets by mouth every 4 (four) hours as needed for moderate pain. 1/2-1 tablet every day PRN, Moderate pain) 30 tablet 0   linaclotide (LINZESS) 145 MCG CAPS capsule Take 1-2 capsules (145-290 mcg total) by mouth daily as needed. 180 capsule 3   metoprolol tartrate (LOPRESSOR) 100 MG tablet TAKE 1 TABLET BY MOUTH  TWICE DAILY 180 tablet 2   mupirocin ointment (BACTROBAN) 2 % APPLY TO AFFECTED AREA(S)  TOPICALLY ON BURN WOUNDS  TWICE DAILY 30 g  0   nortriptyline (PAMELOR) 25 MG capsule TAKE 1 CAPSULE BY MOUTH AT  BEDTIME 90 capsule 2   pantoprazole (PROTONIX) 40 MG tablet TAKE 1 TABLET BY MOUTH  DAILY 90 tablet 3   traZODone (DESYREL) 100 MG tablet Take 100 mg by mouth at bedtime.     hyoscyamine (LEVSIN) 0.125 MG tablet Take 1 tablet (0.125 mg total) by mouth every 4 (four) hours as needed for up to 10 days (hot flash). 100 tablet 3   No current facility-administered medications on file prior to visit.        ROS:  All others reviewed and negative.  Objective        PE:  BP 118/60 (BP Location: Right Arm, Patient Position: Sitting, Cuff Size: Large)   Pulse 61   Temp 98.1 F (36.7 C) (Oral)   Ht 5\' 5"  (1.651 m)   Wt 185 lb (83.9 kg)   SpO2 97%   BMI 30.79 kg/m                 Constitutional: Pt appears in NAD               HENT: Head: NCAT.                Right Ear: External ear normal.                 Left Ear: External ear normal. right tm's with mild erythema and post fluid  Max sinus areas non tender.  Pharynx with mild erythema, no exudate               Eyes: . Pupils are equal, round, and reactive to light. Conjunctivae and EOM are normal                Nose: without d/c or deformity               Neck: Neck supple. Gross normal ROM               Cardiovascular: Normal rate and regular rhythm.                 Pulmonary/Chest: Effort normal and breath sounds without rales or wheezing.                Abd:  Soft, NT, ND, + BS, no organomegaly               Neurological: Pt is alert. At baseline orientation, motor grossly intact, no rash or swelling or tender left lateral side               Skin: Skin is warm. No rashes, no other new lesions, LE edema - none               Psychiatric: Pt behavior is normal without agitation , mild nervous  Micro: none  Cardiac tracings I have personally interpreted today:  none  Pertinent Radiological findings (summarize): none   Lab Results  Component Value Date   WBC 6.7 11/21/2020   HGB 16.0 (H) 11/21/2020   HCT 45.9 11/21/2020   PLT 191.0 11/21/2020   GLUCOSE 105 (H) 11/21/2020   CHOL 165 08/18/2020   TRIG 145 08/18/2020   HDL 35 (L) 08/18/2020   LDLDIRECT 121.0 02/26/2018   LDLCALC 104 (H) 08/18/2020   ALT 16 02/26/2018   AST 12 02/26/2018   NA 136 11/21/2020   K 4.3 11/21/2020   CL 101 11/21/2020   CREATININE 0.63  11/21/2020   BUN 8 11/21/2020   CO2 28 11/21/2020   TSH 1.50 02/26/2018   INR 1.05 08/29/2014   HGBA1C 5.4 11/21/2020   Assessment/Plan:  WALIDA CAJAS is a 50 y.o. White or Caucasian [1] female with  has a past medical history of Anxiety state, unspecified, Arthritis, Asthma, Attention deficit disorder without mention of hyperactivity, Bipolar disorder ([place]), Constipation, Depressive disorder, not elsewhere classified, Esophageal reflux, Hypertension, Migraine without aura, without mention of intractable migraine without mention of status migrainosus, Pain in joint, site unspecified, Pneumonia, Polycythemia, Unspecified asthma(493.90), Unspecified essential hypertension, and Unspecified vitamin D deficiency.  Left-sided back pain With mild intermittent low left lateral  side pain out of character for her usual chronic left back pain - d/w pt  - differential still includes msk vs neuritic vs pelvic referred vs other pain - pt agrees to check UA today and follow with expectant management and o/w is reassured, ok to increase the celebrex to bid prn  Depression Overall stable per pt, declines need for change in tx or new referral for counseling  Essential hypertension BP Readings from Last 3 Encounters:  12/27/20 118/60  12/13/20 108/82  08/30/20 120/82   Stable, pt to continue medical treatment lopressor   Allergic rhinitis With mild worsening symptoms and right ear eustachian tube dysfxn in past 2-3 wks - for restart flonase, add mucinex bid prn as well  Followup: Return if symptoms worsen or fail to improve.  Cathlean Cower, MD 12/28/2020 5:08 AM [facility]

## 2020-12-27 NOTE — Patient Instructions (Signed)
Ok to increase the celebrex to twice per day as needed  Remember to also take the mucinex and flonase for the sinuses and right ear  Please continue all other medications as before, and refills have been done if requested.  Please have the pharmacy call with any other refills you may need.  Please continue your efforts at being more active, low cholesterol diet, and weight control  Please keep your appointments with your specialists as you may have planned  Please go to the LAB at the blood drawing area for the tests to be done -just the urine testing today  You will be contacted by phone if any changes need to be made immediately.  Otherwise, you will receive a letter about your results with an explanation, but please check with MyChart first.  Please remember to sign up for MyChart if you have not done so, as this will be important to you in the future with finding out test results, communicating by private email, and scheduling acute appointments online when needed.

## 2020-12-28 ENCOUNTER — Encounter: Payer: Self-pay | Admitting: Internal Medicine

## 2020-12-28 DIAGNOSIS — J309 Allergic rhinitis, unspecified: Secondary | ICD-10-CM | POA: Insufficient documentation

## 2020-12-28 NOTE — Assessment & Plan Note (Signed)
BP Readings from Last 3 Encounters:  12/27/20 118/60  12/13/20 108/82  08/30/20 120/82   Stable, pt to continue medical treatment lopressor

## 2020-12-28 NOTE — Assessment & Plan Note (Signed)
Overall stable per pt, declines need for change in tx or new referral for counseling

## 2020-12-28 NOTE — Assessment & Plan Note (Addendum)
With mild intermittent low left lateral side pain out of character for her usual chronic left back pain - d/w pt  - differential still includes msk vs neuritic vs pelvic referred vs other pain - pt agrees to check UA today and follow with expectant management and o/w is reassured, ok to increase the celebrex to bid prn

## 2020-12-28 NOTE — Assessment & Plan Note (Signed)
With mild worsening symptoms and right ear eustachian tube dysfxn in past 2-3 wks - for restart flonase, add mucinex bid prn as well

## 2021-01-04 DIAGNOSIS — G894 Chronic pain syndrome: Secondary | ICD-10-CM | POA: Diagnosis not present

## 2021-01-10 DIAGNOSIS — M549 Dorsalgia, unspecified: Secondary | ICD-10-CM | POA: Diagnosis not present

## 2021-01-10 DIAGNOSIS — M5414 Radiculopathy, thoracic region: Secondary | ICD-10-CM | POA: Insufficient documentation

## 2021-01-10 DIAGNOSIS — M47816 Spondylosis without myelopathy or radiculopathy, lumbar region: Secondary | ICD-10-CM | POA: Diagnosis not present

## 2021-01-10 DIAGNOSIS — M47812 Spondylosis without myelopathy or radiculopathy, cervical region: Secondary | ICD-10-CM | POA: Diagnosis not present

## 2021-01-12 ENCOUNTER — Encounter: Payer: Self-pay | Admitting: Internal Medicine

## 2021-01-12 MED ORDER — CELEBREX 200 MG PO CAPS: 200.0000 mg | ORAL_CAPSULE | Freq: Two times a day (BID) | ORAL | 1 refills | Status: DC

## 2021-01-12 NOTE — Telephone Encounter (Signed)
Completed via covr-my-meds with (Key: [id number]). Waiting on insurance response.Marland KitchenJohny Gonzalez

## 2021-01-15 ENCOUNTER — Telehealth: Payer: Self-pay | Admitting: Cardiovascular Disease

## 2021-01-15 ENCOUNTER — Encounter: Payer: Self-pay | Admitting: Internal Medicine

## 2021-01-15 NOTE — Telephone Encounter (Signed)
° °  Primary Cardiologist: Sanda Klein, MD  Chart reviewed as part of pre-operative protocol coverage. Given past medical history and time since last visit, based on ACC/AHA guidelines, [name] would be at acceptable risk for the planned procedure without further cardiovascular testing.   According to the Revised Cardiac Risk Index (RCRI), she is a class I risk for major cardiac event. She is able to complete METS >4.   She may hold her Aspirin 5-7 days prior to surgery. Please resume as soon as hemostasis is achieved at the discretion of the surgeon.   I will route this recommendation to the requesting party via Epic fax function and remove from pre-op pool.  Please call with questions.  Lenna Sciara, NP  01/15/2021, 11:44 AM

## 2021-01-15 NOTE — Telephone Encounter (Signed)
[name]   Last seen in the office on 08/18/20. Requesting preoperative cardiac evaluation for trigger finger release. She was stable from a cardiac standpoint. Follow-up was recommended for 12 months.   Her PMH includes tobacco use, hypertension, symptomatic sinus tachycardia managed with BB, aortic atherosclerosis, elevated calcium score (215, 99th percentile), and strong family history of CAD.  Myoview in 2021 was low risk.  May her aspirin be held prior to her procedure?  Lenna Sciara, NP

## 2021-01-15 NOTE — Telephone Encounter (Signed)
Check statusof [place] med ha[place] been [place] it states.. " Kamen Hanken CELEBREX CAP 200MG , use as directed, is approved for a non-formulary exception through 01/27/2022 under your Medicare Part D benefit " Faxed approval and notified pt with information.[name]

## 2021-01-15 NOTE — Telephone Encounter (Signed)
° ° ° °  Pre-operative Risk Assessment    Patient Name: [name]  DOB: [birth year] MRN: [id number]      Request for Surgical Clearance    Procedure:   right thumb trigger finger release   Date of Surgery:  Clearance 02/08/21                                 Surgeon:  Dr. Melrose Nakayama Surgeon's Group or Practice Name:  [facility] Phone number:  [phone] Fax number:  [phone]   Type of Clearance Requested:   - Medical  - Pharmacy:  Hold Aspirin defer cards how long pt needs to held prior procedure    Type of Anesthesia:  MAC   Additional requests/questions:    Signed, Selinda Orion   01/15/2021, 10:32 AM

## 2021-01-16 ENCOUNTER — Encounter: Payer: Self-pay | Admitting: Internal Medicine

## 2021-01-16 MED ORDER — CELECOXIB 200 MG PO CAPS
200.0000 mg | ORAL_CAPSULE | Freq: Two times a day (BID) | ORAL | 1 refills | Status: DC
Start: 2021-01-16 — End: 2021-03-29

## 2021-01-24 DIAGNOSIS — M546 Pain in thoracic spine: Secondary | ICD-10-CM | POA: Diagnosis not present

## 2021-01-24 DIAGNOSIS — M5136 Other intervertebral disc degeneration, lumbar region: Secondary | ICD-10-CM | POA: Diagnosis not present

## 2021-01-24 DIAGNOSIS — G894 Chronic pain syndrome: Secondary | ICD-10-CM | POA: Diagnosis not present

## 2021-01-24 DIAGNOSIS — M545 Low back pain, unspecified: Secondary | ICD-10-CM | POA: Diagnosis not present

## 2021-01-26 DIAGNOSIS — G8929 Other chronic pain: Secondary | ICD-10-CM | POA: Diagnosis not present

## 2021-01-26 DIAGNOSIS — M5414 Radiculopathy, thoracic region: Secondary | ICD-10-CM | POA: Diagnosis not present

## 2021-01-26 DIAGNOSIS — M25561 Pain in right knee: Secondary | ICD-10-CM | POA: Diagnosis not present

## 2021-01-26 DIAGNOSIS — M25562 Pain in left knee: Secondary | ICD-10-CM | POA: Diagnosis not present

## 2021-01-31 DIAGNOSIS — M546 Pain in thoracic spine: Secondary | ICD-10-CM | POA: Diagnosis not present

## 2021-01-31 DIAGNOSIS — M4804 Spinal stenosis, thoracic region: Secondary | ICD-10-CM | POA: Diagnosis not present

## 2021-01-31 DIAGNOSIS — M5124 Other intervertebral disc displacement, thoracic region: Secondary | ICD-10-CM | POA: Diagnosis not present

## 2021-01-31 DIAGNOSIS — M5414 Radiculopathy, thoracic region: Secondary | ICD-10-CM | POA: Diagnosis not present

## 2021-02-03 DIAGNOSIS — G894 Chronic pain syndrome: Secondary | ICD-10-CM | POA: Diagnosis not present

## 2021-02-19 DIAGNOSIS — M47814 Spondylosis without myelopathy or radiculopathy, thoracic region: Secondary | ICD-10-CM | POA: Diagnosis not present

## 2021-02-20 DIAGNOSIS — M545 Low back pain, unspecified: Secondary | ICD-10-CM | POA: Diagnosis not present

## 2021-02-20 DIAGNOSIS — G894 Chronic pain syndrome: Secondary | ICD-10-CM | POA: Diagnosis not present

## 2021-02-20 DIAGNOSIS — M546 Pain in thoracic spine: Secondary | ICD-10-CM | POA: Diagnosis not present

## 2021-02-20 DIAGNOSIS — M5136 Other intervertebral disc degeneration, lumbar region: Secondary | ICD-10-CM | POA: Diagnosis not present

## 2021-02-21 ENCOUNTER — Encounter: Payer: Self-pay | Admitting: Internal Medicine

## 2021-02-22 ENCOUNTER — Encounter: Payer: Self-pay | Admitting: Internal Medicine

## 2021-02-27 ENCOUNTER — Telehealth: Payer: Medicare Other | Admitting: Physician Assistant

## 2021-02-27 DIAGNOSIS — U071 COVID-19: Secondary | ICD-10-CM

## 2021-02-27 MED ORDER — BENZONATATE 100 MG PO CAPS: 100.0000 mg | ORAL_CAPSULE | Freq: Three times a day (TID) | ORAL | 0 refills | Status: DC | PRN

## 2021-02-27 MED ORDER — MOLNUPIRAVIR EUA 200MG CAPSULE: 4.0000 | ORAL_CAPSULE | Freq: Two times a day (BID) | ORAL | 0 refills | Status: AC
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
## 2021-02-27 NOTE — Patient Instructions (Signed)
[name], thank you for joining Leeanne Rio, PA-C for today's virtual visit.  While this provider is not your primary care provider (PCP), if your PCP is located in our provider database this encounter information will be shared with them immediately following your visit.  Consent: (Patient) [name] provided verbal consent for this virtual visit at the beginning of the encounter.  Current Medications:  Current Outpatient Medications:    benzonatate (TESSALON) 100 MG capsule, Take 1 capsule (100 mg total) by mouth 3 (three) times daily as needed for cough., Disp: 30 capsule, Rfl: 0   molnupiravir EUA (LAGEVRIO) 200 mg CAPS capsule, Take 4 capsules (800 mg total) by mouth 2 (two) times daily for 5 days., Disp: 40 capsule, Rfl: 0   albuterol (VENTOLIN HFA) 108 (90 Base) MCG/ACT inhaler, Inhale 2 puffs into the lungs every 6 (six) hours as needed for wheezing or shortness of breath., Disp: 8 g, Rfl: 0   amphetamine-dextroamphetamine (ADDERALL) 10 MG tablet, Take 10 mg by mouth daily., Disp: , Rfl: 0   aspirin (BAYER ASPIRIN) 325 MG tablet, Take 1 tablet (325 mg total) by mouth daily., Disp: 100 tablet, Rfl: 3   atorvastatin (LIPITOR) 40 MG tablet, TAKE 1 TABLET BY MOUTH  DAILY, Disp: 90 tablet, Rfl: 3   budesonide-formoterol (SYMBICORT) 160-4.5 MCG/ACT inhaler, Inhale 2 puffs into the lungs 2 (two) times daily. FAXED TO [facility] [phone] [id number], Disp: 30.6 each, Rfl: 3   celecoxib (CELEBREX) 200 MG capsule, Take 1 capsule (200 mg total) by mouth 2 (two) times daily., Disp: 180 capsule, Rfl: 1   Cholecalciferol (VITAMIN D3) 50 MCG (2000 UT) capsule, Take 1 capsule (2,000 Units total) by mouth daily., Disp: 100 capsule, Rfl: 3   cyclobenzaprine (FLEXERIL) 10 MG tablet, TAKE 1/2 - 1 TABLET BY MOUTH DAILY OR TWICE DAILY IF TOLERATED, Disp: , Rfl: 0   dexamethasone (DECADRON) 4 MG tablet, Take 4 mg by mouth 2 (two) times daily., Disp: , Rfl:    estrogens, conjugated,  (PREMARIN) 0.625 MG tablet, Take 1 tablet (0.625 mg total) by mouth daily., Disp: 90 tablet, Rfl: 3   famotidine (PEPCID) 40 MG tablet, TAKE 1 TABLET BY MOUTH  DAILY, Disp: 90 tablet, Rfl: 3   fluticasone (FLONASE) 50 MCG/ACT nasal spray, Place 2 sprays into both nostrils daily., Disp: 48 g, Rfl: 3   gabapentin (NEURONTIN) 300 MG capsule, Take 1 capsule (300 mg total) by mouth 3 (three) times daily., Disp: 270 capsule, Rfl: 1   HYDROcodone-acetaminophen (NORCO/VICODIN) 5-325 MG tablet, Take 1-2 tablets by mouth every 4 (four) hours as needed for moderate pain. (Patient taking differently: Take 1-2 tablets by mouth every 4 (four) hours as needed for moderate pain. 1/2-1 tablet every day PRN, Moderate pain), Disp: 30 tablet, Rfl: 0   hyoscyamine (LEVSIN) 0.125 MG tablet, Take 1 tablet (0.125 mg total) by mouth every 4 (four) hours as needed for up to 10 days (hot flash)., Disp: 100 tablet, Rfl: 3   linaclotide (LINZESS) 145 MCG CAPS capsule, Take 1-2 capsules (145-290 mcg total) by mouth daily as needed., Disp: 180 capsule, Rfl: 3   metoprolol tartrate (LOPRESSOR) 100 MG tablet, TAKE 1 TABLET BY MOUTH  TWICE DAILY, Disp: 180 tablet, Rfl: 2   nortriptyline (PAMELOR) 25 MG capsule, TAKE 1 CAPSULE BY MOUTH AT  BEDTIME, Disp: 90 capsule, Rfl: 2   pantoprazole (PROTONIX) 40 MG tablet, TAKE 1 TABLET BY MOUTH  DAILY, Disp: 90 tablet, Rfl: 3   traZODone (DESYREL) 100 MG  tablet, Take 100 mg by mouth at bedtime., Disp: , Rfl:    Medications ordered in this encounter:  Meds ordered this encounter  Medications   benzonatate (TESSALON) 100 MG capsule    Sig: Take 1 capsule (100 mg total) by mouth 3 (three) times daily as needed for cough.    Dispense:  30 capsule    Refill:  0    Order Specific Question:   Supervising Provider    Answer:   MILLER, BRIAN [[id number]]   molnupiravir EUA (LAGEVRIO) 200 mg CAPS capsule    Sig: Take 4 capsules (800 mg total) by mouth 2 (two) times daily for 5 days.    Dispense:  40  capsule    Refill:  0    Order Specific Question:   Supervising Provider    Answer:   Sabra Heck, [place]     *If you need refills on other medications prior to your next appointment, please contact your pharmacy*  Follow-Up: Call back or seek an in-person evaluation if the symptoms worsen or if the condition fails to improve as anticipated.  Other Instructions Please keep well-hydrated and get plenty of rest. Start a saline nasal rinse to flush out your nasal passages. You can use plain Mucinex to help thin congestion. If you have a humidifier, running in the bedroom at night. I want you to start OTC vitamin D3 1000 units daily, vitamin C 1000 mg daily, and a zinc supplement. Please take prescribed medications as directed.  You have been enrolled in a MyChart symptom monitoring program. Please answer these questions daily so we can keep track of how you are doing.  You were to quarantine for 5 days from onset of your symptoms.  After day 5, if you have had no fever and you are feeling better, you can end quarantine but need to mask for an additional 5 days. After day 5 if you have a fever or are having significant symptoms, please quarantine for full 10 days.  If you note any worsening of symptoms, any significant shortness of breath or any chest pain, please seek ER evaluation ASAP.  Please do not delay care!  COVID-19: What to Do if You Are Sick If you test positive and are an older adult or someone who is at high risk of getting very sick from COVID-19, treatment may be available. Contact a healthcare provider right away after a positive test to determine if you are eligible, even if your symptoms are mild right now. You can also visit a Test to Treat location and, if eligible, receive a prescription from a provider. Don't delay: Treatment must be started within the first few days to be effective. If you have a fever, cough, or other symptoms, you might have COVID-19. Most people  have mild illness and are able to recover at home. If you are sick: Keep track of your symptoms. If you have an emergency warning sign (including trouble breathing), call 911. Steps to help prevent the spread of COVID-19 if you are sick If you are sick with COVID-19 or think you might have COVID-19, follow the steps below to care for yourself and to help protect other people in your home and community. Stay home except to get medical care Stay home. Most people with COVID-19 have mild illness and can recover at home without medical care. Do not leave your home, except to get medical care. Do not visit public areas and do not go to places where you are unable  to wear a mask. Take care of yourself. Get rest and stay hydrated. Take over-the-counter medicines, such as acetaminophen, to help you feel better. Stay in touch with your doctor. Call before you get medical care. Be sure to get care if you have trouble breathing, or have any other emergency warning signs, or if you think it is an emergency. Avoid public transportation, ride-sharing, or taxis if possible. Get tested If you have symptoms of COVID-19, get tested. While waiting for test results, stay away from others, including staying apart from those living in your household. Get tested as soon as possible after your symptoms start. Treatments may be available for people with COVID-19 who are at risk for becoming very sick. Don't delay: Treatment must be started early to be effective--some treatments must begin within 5 days of your first symptoms. Contact your healthcare provider right away if your test result is positive to determine if you are eligible. Self-tests are one of several options for testing for the virus that causes COVID-19 and may be more convenient than laboratory-based tests and point-of-care tests. Ask your healthcare provider or your local health department if you need help interpreting your test results. You can visit your  state, tribal, local, and territorial health department's website to look for the latest local information on testing sites. Separate yourself from other people As much as possible, stay in a specific room and away from other people and pets in your home. If possible, you should use a separate bathroom. If you need to be around other people or animals in or outside of the home, wear a well-fitting mask. Tell your close contacts that they may have been exposed to COVID-19. An infected person can spread COVID-19 starting 48 hours (or 2 days) before the person has any symptoms or tests positive. By letting your close contacts know they may have been exposed to COVID-19, you are helping to protect everyone. See COVID-19 and Animals if you have questions about pets. If you are diagnosed with COVID-19, someone from the health department may call you. Answer the call to slow the spread. Monitor your symptoms Symptoms of COVID-19 include fever, cough, or other symptoms. Follow care instructions from your healthcare provider and local health department. Your local health authorities may give instructions on checking your symptoms and reporting information. When to seek emergency medical attention Look for emergency warning signs* for COVID-19. If someone is showing any of these signs, seek emergency medical care immediately: Trouble breathing Persistent pain or pressure in the chest New confusion Inability to wake or stay awake Pale, gray, or blue-colored skin, lips, or nail beds, depending on skin tone *This list is not all possible symptoms. Please call your medical provider for any other symptoms that are severe or concerning to you. Call 911 or call ahead to your local emergency facility: Notify the operator that you are seeking care for someone who has or may have COVID-19. Call ahead before visiting your doctor Call ahead. Many medical visits for routine care are being postponed or done by phone or  telemedicine. If you have a medical appointment that cannot be postponed, call your doctor's office, and tell them you have or may have COVID-19. This will help the office protect themselves and other patients. If you are sick, wear a well-fitting mask You should wear a mask if you must be around other people or animals, including pets (even at home). Wear a mask with the best fit, protection, and comfort for you. You  don't need to wear the mask if you are alone. If you can't put on a mask (because of trouble breathing, for example), cover your coughs and sneezes in some other way. Try to stay at least 6 feet away from other people. This will help protect the people around you. Masks should not be placed on young children under age 36 years, anyone who has trouble breathing, or anyone who is not able to remove the mask without help. Cover your coughs and sneezes Cover your mouth and nose with a tissue when you cough or sneeze. Throw away used tissues in a lined trash can. Immediately wash your hands with soap and water for at least 20 seconds. If soap and water are not available, clean your hands with an alcohol-based hand sanitizer that contains at least 60% alcohol. Clean your hands often Wash your hands often with soap and water for at least 20 seconds. This is especially important after blowing your nose, coughing, or sneezing; going to the bathroom; and before eating or preparing food. Use hand sanitizer if soap and water are not available. Use an alcohol-based hand sanitizer with at least 60% alcohol, covering all surfaces of your hands and rubbing them together until they feel dry. Soap and water are the best option, especially if hands are visibly dirty. Avoid touching your eyes, nose, and mouth with unwashed hands. Handwashing Tips Avoid sharing personal household items Do not share dishes, drinking glasses, cups, eating utensils, towels, or bedding with other people in your home. Wash  these items thoroughly after using them with soap and water or put in the dishwasher. Clean surfaces in your home regularly Clean and disinfect high-touch surfaces (for example, doorknobs, tables, handles, light switches, and countertops) in your "sick room" and bathroom. In shared spaces, you should clean and disinfect surfaces and items after each use by the person who is ill. If you are sick and cannot clean, a caregiver or other person should only clean and disinfect the area around you (such as your bedroom and bathroom) on an as needed basis. Your caregiver/other person should wait as long as possible (at least several hours) and wear a mask before entering, cleaning, and disinfecting shared spaces that you use. Clean and disinfect areas that may have blood, stool, or body fluids on them. Use household cleaners and disinfectants. Clean visible dirty surfaces with household cleaners containing soap or detergent. Then, use a household disinfectant. Use a product from [employer] List N: Disinfectants for Coronavirus (WERXV-40). Be sure to follow the instructions on the label to ensure safe and effective use of the product. Many products recommend keeping the surface wet with a disinfectant for a certain period of time (look at "contact time" on the product label). You may also need to wear personal protective equipment, such as gloves, depending on the directions on the product label. Immediately after disinfecting, wash your hands with soap and water for 20 seconds. For completed guidance on cleaning and disinfecting your home, visit Complete Disinfection Guidance. Take steps to improve ventilation at home Improve ventilation (air flow) at home to help prevent from spreading COVID-19 to other people in your household. Clear out COVID-19 virus particles in the air by opening windows, using air filters, and turning on fans in your home. Use this interactive tool to learn how to improve air flow in your  home. When you can be around others after being sick with COVID-19 Deciding when you can be around others is different for different  situations. Find out when you can safely end home isolation. For any additional questions about your care, contact your healthcare provider or state or local health department. 04/18/2020 Content source: [facility] for Immunization and Respiratory Diseases ([facility]), Division of Viral Diseases This information is not intended to replace advice given to you by your health care provider. Make sure you discuss any questions you have with your health care provider. Document Revised: 06/01/2020 Document Reviewed: 06/01/2020 Elsevier Patient Education  2022 [employer].      If you have been instructed to have an in-person evaluation today at a local Urgent Care facility, please use the link below. It will take you to a list of all of our available [facility], including address, phone number and hours of operation. Please do not delay care.  [facility]  If you or a family member do not have a primary care provider, use the link below to schedule a visit and establish care. When you choose a [facility] primary care physician or advanced practice provider, you gain a long-term partner in health. Find a Primary Care Provider  Learn more about 's in-office and virtual care options: [facility]

## 2021-03-05 ENCOUNTER — Ambulatory Visit: Payer: Medicare Other | Admitting: Internal Medicine

## 2021-03-06 ENCOUNTER — Telehealth: Payer: Self-pay

## 2021-03-06 NOTE — Telephone Encounter (Signed)
Spoke with pt and was able to info her that her Symbicort Aero have arrived and is ready for pick up. Pt states she understood and will be by to pick them up as soon as she can.

## 2021-03-15 ENCOUNTER — Encounter: Payer: Self-pay | Admitting: Internal Medicine

## 2021-03-15 ENCOUNTER — Ambulatory Visit (INDEPENDENT_AMBULATORY_CARE_PROVIDER_SITE_OTHER): Payer: Medicare Other | Admitting: Internal Medicine

## 2021-03-15 ENCOUNTER — Other Ambulatory Visit: Payer: Self-pay

## 2021-03-15 ENCOUNTER — Other Ambulatory Visit (INDEPENDENT_AMBULATORY_CARE_PROVIDER_SITE_OTHER): Payer: Medicare Other

## 2021-03-15 DIAGNOSIS — R739 Hyperglycemia, unspecified: Secondary | ICD-10-CM | POA: Diagnosis not present

## 2021-03-15 DIAGNOSIS — I2510 Atherosclerotic heart disease of native coronary artery without angina pectoris: Secondary | ICD-10-CM

## 2021-03-15 DIAGNOSIS — J4520 Mild intermittent asthma, uncomplicated: Secondary | ICD-10-CM

## 2021-03-15 DIAGNOSIS — D751 Secondary polycythemia: Secondary | ICD-10-CM

## 2021-03-15 DIAGNOSIS — I2583 Coronary atherosclerosis due to lipid rich plaque: Secondary | ICD-10-CM

## 2021-03-15 DIAGNOSIS — F32A Depression, unspecified: Secondary | ICD-10-CM

## 2021-03-15 DIAGNOSIS — I10 Essential (primary) hypertension: Secondary | ICD-10-CM

## 2021-03-15 LAB — CBC WITH DIFFERENTIAL/PLATELET
Basophils Absolute: 0.1 10*3/uL (ref 0.0–0.1)
Basophils Relative: 1.2 % (ref 0.0–3.0)
Eosinophils Absolute: 0.1 10*3/uL (ref 0.0–0.7)
Eosinophils Relative: 0.9 % (ref 0.0–5.0)
HCT: 42.1 % (ref 36.0–46.0)
Hemoglobin: 14.5 g/dL (ref 12.0–15.0)
Lymphocytes Relative: 24.3 % (ref 12.0–46.0)
Lymphs Abs: 1.8 10*3/uL (ref 0.7–4.0)
MCHC: 34.5 g/dL (ref 30.0–36.0)
MCV: 93.1 fl (ref 78.0–100.0)
Monocytes Absolute: 0.8 10*3/uL (ref 0.1–1.0)
Monocytes Relative: 11.4 % (ref 3.0–12.0)
Neutro Abs: 4.6 10*3/uL (ref 1.4–7.7)
Neutrophils Relative %: 62.2 % (ref 43.0–77.0)
Platelets: 179 10*3/uL (ref 150.0–400.0)
RBC: 4.53 Mil/uL (ref 3.87–5.11)
RDW: 12.5 % (ref 11.5–15.5)
WBC: 7.4 10*3/uL (ref 4.0–10.5)

## 2021-03-15 LAB — HEMOGLOBIN A1C: Hgb A1c MFr Bld: 6.1 % (ref 4.6–6.5)

## 2021-03-15 LAB — BASIC METABOLIC PANEL
BUN: 14 mg/dL (ref 6–23)
CO2: 30 mEq/L (ref 19–32)
Calcium: 9 mg/dL (ref 8.4–10.5)
Chloride: 102 mEq/L (ref 96–112)
Creatinine, Ser: 0.69 mg/dL (ref 0.40–1.20)
GFR: 102.71 mL/min (ref >60.00)
Glucose, Bld: 103 mg/dL — ABNORMAL HIGH (ref 70–99)
Potassium: 4.7 mEq/L (ref 3.5–5.1)
Sodium: 137 mEq/L (ref 135–145)

## 2021-03-15 NOTE — Progress Notes (Signed)
Subjective:  Patient ID: [name], female    DOB: Apr 29, 1970  Age: 51 y.o. MRN: [id number]  CC: No chief complaint on file.   HPI [name] presents for HRT - needs Multimedia programmer form, asthma, mood disorder follow-up  Outpatient Medications Prior to Visit  Medication Sig Dispense Refill   albuterol (VENTOLIN HFA) 108 (90 Base) MCG/ACT inhaler Inhale 2 puffs into the lungs every 6 (six) hours as needed for wheezing or shortness of breath. 8 g 0   amphetamine-dextroamphetamine (ADDERALL) 10 MG tablet Take 10 mg by mouth daily.  0   aspirin (BAYER ASPIRIN) 325 MG tablet Take 1 tablet (325 mg total) by mouth daily. 100 tablet 3   atorvastatin (LIPITOR) 40 MG tablet TAKE 1 TABLET BY MOUTH  DAILY 90 tablet 3   benzonatate (TESSALON) 100 MG capsule Take 1 capsule (100 mg total) by mouth 3 (three) times daily as needed for cough. 30 capsule 0   budesonide-formoterol (SYMBICORT) 160-4.5 MCG/ACT inhaler Inhale 2 puffs into the lungs 2 (two) times daily. FAXED TO [facility] [phone] [id number] 30.6 each 3   Cholecalciferol (VITAMIN D3) 50 MCG (2000 UT) capsule Take 1 capsule (2,000 Units total) by mouth daily. 100 capsule 3   dexamethasone (DECADRON) 4 MG tablet Take 4 mg by mouth 2 (two) times daily.     estrogens, conjugated, (PREMARIN) 0.625 MG tablet Take 1 tablet (0.625 mg total) by mouth daily. 90 tablet 3   famotidine (PEPCID) 40 MG tablet TAKE 1 TABLET BY MOUTH  DAILY 90 tablet 3   fluticasone (FLONASE) 50 MCG/ACT nasal spray Place 2 sprays into both nostrils daily. 48 g 3   gabapentin (NEURONTIN) 300 MG capsule Take 1 capsule (300 mg total) by mouth 3 (three) times daily. 270 capsule 1   HYDROcodone-acetaminophen (NORCO/VICODIN) 5-325 MG tablet Take 1-2 tablets by mouth every 4 (four) hours as needed for moderate pain. (Patient taking differently: Take 1-2 tablets by mouth every 4 (four) hours as needed for moderate pain. 1/2-1 tablet every day PRN, Moderate pain) 30  tablet 0   linaclotide (LINZESS) 145 MCG CAPS capsule Take 1-2 capsules (145-290 mcg total) by mouth daily as needed. 180 capsule 3   metoprolol tartrate (LOPRESSOR) 100 MG tablet TAKE 1 TABLET BY MOUTH  TWICE DAILY 180 tablet 2   celecoxib (CELEBREX) 200 MG capsule Take 1 capsule (200 mg total) by mouth 2 (two) times daily. 180 capsule 1   cyclobenzaprine (FLEXERIL) 10 MG tablet TAKE 1/2 - 1 TABLET BY MOUTH DAILY OR TWICE DAILY IF TOLERATED  0   nortriptyline (PAMELOR) 25 MG capsule TAKE 1 CAPSULE BY MOUTH AT  BEDTIME 90 capsule 2   pantoprazole (PROTONIX) 40 MG tablet TAKE 1 TABLET BY MOUTH  DAILY 90 tablet 3   traZODone (DESYREL) 100 MG tablet Take 100 mg by mouth at bedtime.     hyoscyamine (LEVSIN) 0.125 MG tablet Take 1 tablet (0.125 mg total) by mouth every 4 (four) hours as needed for up to 10 days (hot flash). 100 tablet 3   No facility-administered medications prior to visit.    ROS: Review of Systems  Constitutional:  Positive for unexpected weight change. Negative for activity change, appetite change, chills and fatigue.  HENT:  Positive for postnasal drip. Negative for congestion, mouth sores and sinus pressure.   Eyes:  Negative for visual disturbance.  Respiratory:  Negative for cough and chest tightness.   Gastrointestinal:  Negative for abdominal pain and nausea.  Genitourinary:  Negative for difficulty urinating, frequency and vaginal pain.  Musculoskeletal:  Negative for back pain and gait problem.  Skin:  Negative for pallor and rash.  Neurological:  Negative for dizziness, tremors, weakness, numbness and headaches.  Psychiatric/Behavioral:  Positive for dysphoric mood. Negative for confusion and sleep disturbance. The patient is nervous/anxious.    Objective:  BP 118/74 (BP Location: Left Arm, Patient Position: Sitting, Cuff Size: Large)    Pulse 79    Temp 98.6 F (37 C) (Oral)    Ht 5\' 5"  (1.651 m)    Wt 192 lb (87.1 kg)    SpO2 96%    BMI 31.95 kg/m   BP  Readings from Last 3 Encounters:  03/15/21 118/74  12/27/20 118/60  12/13/20 108/82    Wt Readings from Last 3 Encounters:  03/15/21 192 lb (87.1 kg)  12/27/20 185 lb (83.9 kg)  12/13/20 182 lb 9.6 oz (82.8 kg)    Physical Exam Constitutional:      General: She is not in acute distress.    Appearance: She is well-developed. She is obese.  HENT:     Head: Normocephalic.     Right Ear: External ear normal.     Left Ear: External ear normal.     Nose: Nose normal.  Eyes:     General:        Right eye: No discharge.        Left eye: No discharge.     Conjunctiva/sclera: Conjunctivae normal.     Pupils: Pupils are equal, round, and reactive to light.  Neck:     Thyroid: No thyromegaly.     Vascular: No JVD.     Trachea: No tracheal deviation.  Cardiovascular:     Rate and Rhythm: Normal rate and regular rhythm.     Heart sounds: Normal heart sounds.  Pulmonary:     Effort: No respiratory distress.     Breath sounds: No stridor. No wheezing.  Abdominal:     General: Bowel sounds are normal. There is no distension.     Palpations: Abdomen is soft. There is no mass.     Tenderness: There is no abdominal tenderness. There is no guarding or rebound.  Musculoskeletal:        General: No tenderness.     Cervical back: Normal range of motion and neck supple. No rigidity.  Lymphadenopathy:     Cervical: No cervical adenopathy.  Skin:    Findings: No erythema or rash.  Neurological:     Cranial Nerves: No cranial nerve deficit.     Motor: No abnormal muscle tone.     Coordination: Coordination normal.     Deep Tendon Reflexes: Reflexes normal.  Psychiatric:        Behavior: Behavior normal.        Thought Content: Thought content normal.        Judgment: Judgment normal.   Forms filled out  Lab Results  Component Value Date   WBC 7.4 03/15/2021   HGB 14.5 03/15/2021   HCT 42.1 03/15/2021   PLT 179.0 03/15/2021   GLUCOSE 103 (H) 03/15/2021   CHOL 165 08/18/2020    TRIG 145 08/18/2020   HDL 35 (L) 08/18/2020   LDLDIRECT 121.0 02/26/2018   LDLCALC 104 (H) 08/18/2020   ALT 16 02/26/2018   AST 12 02/26/2018   NA 137 03/15/2021   K 4.7 03/15/2021   CL 102 03/15/2021   CREATININE 0.69 03/15/2021   BUN 14 03/15/2021  CO2 30 03/15/2021   TSH 1.50 02/26/2018   INR 1.05 08/29/2014   HGBA1C 6.1 03/15/2021    MM 3D SCREEN BREAST BILATERAL  Result Date: 10/04/2020 CLINICAL DATA:  Screening. EXAM: DIGITAL SCREENING BILATERAL MAMMOGRAM WITH TOMOSYNTHESIS AND CAD TECHNIQUE: Bilateral screening digital craniocaudal and mediolateral oblique mammograms were obtained. Bilateral screening digital breast tomosynthesis was performed. The images were evaluated with computer-aided detection. COMPARISON:  Previous exam(s). ACR Breast Density Category b: There are scattered areas of fibroglandular density. FINDINGS: There are no findings suspicious for malignancy. IMPRESSION: No mammographic evidence of malignancy. A result letter of this screening mammogram will be mailed directly to the patient. RECOMMENDATION: Screening mammogram in one year. (Code:SM-B-01Y) BI-RADS CATEGORY  1: Negative. Electronically Signed   By: Nolon Nations M.D.   On: 10/04/2020 09:22    Assessment & Plan:   Problem List Items Addressed This Visit     Asthma    Continue with Symbicort.  Ventolin as needed      Coronary atherosclerosis    On aspirin and Lipitor      Depression    Continue with nortriptyline.  Follow-up with Dr. Adele Schilder. Off trazodone      Essential hypertension    BP Readings from Last 3 Encounters:  03/15/21 118/74  12/27/20 118/60  12/13/20 108/82  Continue with metoprolol          No orders of the defined types were placed in this encounter.     Follow-up: Return in about 3 months (around 06/12/2021) for a follow-up visit.  Walker Kehr, MD

## 2021-03-20 ENCOUNTER — Encounter: Payer: Self-pay | Admitting: Internal Medicine

## 2021-03-20 DIAGNOSIS — M546 Pain in thoracic spine: Secondary | ICD-10-CM | POA: Diagnosis not present

## 2021-03-20 DIAGNOSIS — M545 Low back pain, unspecified: Secondary | ICD-10-CM | POA: Diagnosis not present

## 2021-03-20 DIAGNOSIS — Z79899 Other long term (current) drug therapy: Secondary | ICD-10-CM | POA: Diagnosis not present

## 2021-03-20 DIAGNOSIS — G894 Chronic pain syndrome: Secondary | ICD-10-CM | POA: Diagnosis not present

## 2021-03-20 DIAGNOSIS — M5136 Other intervertebral disc degeneration, lumbar region: Secondary | ICD-10-CM | POA: Diagnosis not present

## 2021-03-20 NOTE — Telephone Encounter (Signed)
Patient is requesting that her nortiptolyne be increased. Patient is wondering if a OV is needed. Please advise

## 2021-03-22 DIAGNOSIS — M65311 Trigger thumb, right thumb: Secondary | ICD-10-CM | POA: Diagnosis not present

## 2021-03-25 DIAGNOSIS — G894 Chronic pain syndrome: Secondary | ICD-10-CM | POA: Diagnosis not present

## 2021-03-26 ENCOUNTER — Encounter: Payer: Self-pay | Admitting: Internal Medicine

## 2021-03-27 ENCOUNTER — Telehealth: Payer: Medicare Other | Admitting: Emergency Medicine

## 2021-03-27 DIAGNOSIS — B3731 Acute candidiasis of vulva and vagina: Secondary | ICD-10-CM

## 2021-03-27 DIAGNOSIS — G894 Chronic pain syndrome: Secondary | ICD-10-CM | POA: Diagnosis not present

## 2021-03-27 MED ORDER — FLUCONAZOLE 150 MG PO TABS: 150.0000 mg | ORAL_TABLET | Freq: Every day | ORAL | 0 refills | Status: DC

## 2021-03-27 NOTE — Progress Notes (Addendum)

## 2021-03-28 ENCOUNTER — Other Ambulatory Visit: Payer: Self-pay

## 2021-03-28 ENCOUNTER — Encounter: Payer: Self-pay | Admitting: Internal Medicine

## 2021-03-28 ENCOUNTER — Other Ambulatory Visit: Payer: Self-pay | Admitting: Internal Medicine

## 2021-03-28 MED ORDER — NORTRIPTYLINE HCL 25 MG PO CAPS: 25.0000 mg | ORAL_CAPSULE | Freq: Every day | ORAL | 2 refills | Status: DC

## 2021-03-29 ENCOUNTER — Other Ambulatory Visit: Payer: Self-pay

## 2021-03-29 ENCOUNTER — Ambulatory Visit: Payer: Medicare Other | Admitting: Internal Medicine

## 2021-03-29 MED ORDER — CELECOXIB 200 MG PO CAPS: 200.0000 mg | ORAL_CAPSULE | Freq: Two times a day (BID) | ORAL | 1 refills | Status: DC

## 2021-04-02 ENCOUNTER — Encounter: Payer: Self-pay | Admitting: Internal Medicine

## 2021-04-03 MED ORDER — PANTOPRAZOLE SODIUM 40 MG PO TBEC: 40.0000 mg | DELAYED_RELEASE_TABLET | Freq: Every day | ORAL | 3 refills | Status: DC

## 2021-04-06 ENCOUNTER — Telehealth: Payer: Self-pay

## 2021-04-06 NOTE — Progress Notes (Addendum)
? ? ?  Chronic Care Management ?Pharmacy Assistant  ? ?Name: KESTREL MIS  MRN: [id number] DOB: [birth year] ? ?[place]   to follow up on patient assistance application for Premarin. Per representative at  [employer] they have not received any portion of the application.   ? ? ?Contacted  [employer]   to follow up on patient assistance application for Linzess. Per representative at  [facility]  states patient has been approved starting 04/06/21 through 01/27/22. Shipment takes 7-10 business days, and next fill date is 06/13/21. ?   ?Ethelene Hal ?Clinical Pharmacist Assistant ?[phone]  ?

## 2021-04-09 ENCOUNTER — Encounter: Payer: Self-pay | Admitting: Internal Medicine

## 2021-04-09 NOTE — Assessment & Plan Note (Signed)
On aspirin and Lipitor

## 2021-04-09 NOTE — Assessment & Plan Note (Signed)
Continue with Symbicort.  Ventolin as needed ?

## 2021-04-09 NOTE — Assessment & Plan Note (Signed)
BP Readings from Last 3 Encounters:  ?03/15/21 118/74  ?12/27/20 118/60  ?12/13/20 108/82  ?Continue with metoprolol ? ?

## 2021-04-09 NOTE — Assessment & Plan Note (Signed)
Continue with nortriptyline.  Follow-up with Dr. Adele Schilder. ?Off trazodone ?

## 2021-04-19 NOTE — Progress Notes (Signed)
[place]   to follow up on patient assistance application for Premarin. Per representative at  [employer]  states patient has been approved starting through 01/27/22. Patient first refill was sent out on 04/11/21 to be delivered at the office of Dr. Alain Marion. Patient has 3 more refills can call in May for second refill. ? ?Ethelene Hal ?Clinical Pharmacist Assistant ?[phone]    ?

## 2021-04-24 DIAGNOSIS — M25519 Pain in unspecified shoulder: Secondary | ICD-10-CM | POA: Diagnosis not present

## 2021-04-24 DIAGNOSIS — M546 Pain in thoracic spine: Secondary | ICD-10-CM | POA: Diagnosis not present

## 2021-04-24 DIAGNOSIS — M545 Low back pain, unspecified: Secondary | ICD-10-CM | POA: Diagnosis not present

## 2021-04-24 DIAGNOSIS — M792 Neuralgia and neuritis, unspecified: Secondary | ICD-10-CM | POA: Diagnosis not present

## 2021-04-24 DIAGNOSIS — M4807 Spinal stenosis, lumbosacral region: Secondary | ICD-10-CM | POA: Diagnosis not present

## 2021-04-24 DIAGNOSIS — M48062 Spinal stenosis, lumbar region with neurogenic claudication: Secondary | ICD-10-CM | POA: Diagnosis not present

## 2021-04-24 DIAGNOSIS — G8929 Other chronic pain: Secondary | ICD-10-CM | POA: Diagnosis not present

## 2021-04-24 DIAGNOSIS — G4700 Insomnia, unspecified: Secondary | ICD-10-CM | POA: Diagnosis not present

## 2021-04-24 DIAGNOSIS — M25559 Pain in unspecified hip: Secondary | ICD-10-CM | POA: Diagnosis not present

## 2021-04-24 DIAGNOSIS — M47897 Other spondylosis, lumbosacral region: Secondary | ICD-10-CM | POA: Diagnosis not present

## 2021-04-24 DIAGNOSIS — M5136 Other intervertebral disc degeneration, lumbar region: Secondary | ICD-10-CM | POA: Diagnosis not present

## 2021-04-24 DIAGNOSIS — Z8249 Family history of ischemic heart disease and other diseases of the circulatory system: Secondary | ICD-10-CM | POA: Diagnosis not present

## 2021-04-24 DIAGNOSIS — G894 Chronic pain syndrome: Secondary | ICD-10-CM | POA: Diagnosis not present

## 2021-04-24 DIAGNOSIS — R519 Headache, unspecified: Secondary | ICD-10-CM | POA: Diagnosis not present

## 2021-05-12 ENCOUNTER — Encounter: Payer: Self-pay | Admitting: Physician Assistant

## 2021-05-12 ENCOUNTER — Telehealth: Payer: Medicare Other | Admitting: Physician Assistant

## 2021-05-12 DIAGNOSIS — Z7189 Other specified counseling: Secondary | ICD-10-CM | POA: Diagnosis not present

## 2021-05-12 NOTE — Patient Instructions (Signed)
?[name], thank you for joining Kennieth Rad, PA-C for today's virtual visit.  While this provider is not your primary care provider (PCP), if your PCP is located in our provider database this encounter information will be shared with them immediately following your visit. ? ?Consent: ?(Patient) [name] provided verbal consent for this virtual visit at the beginning of the encounter. ? ?Current Medications: ? ?Current Outpatient Medications:  ?  albuterol (VENTOLIN HFA) 108 (90 Base) MCG/ACT inhaler, Inhale 2 puffs into the lungs every 6 (six) hours as needed for wheezing or shortness of breath., Disp: 8 g, Rfl: 0 ?  amphetamine-dextroamphetamine (ADDERALL) 10 MG tablet, Take 10 mg by mouth daily., Disp: , Rfl: 0 ?  aspirin (BAYER ASPIRIN) 325 MG tablet, Take 1 tablet (325 mg total) by mouth daily., Disp: 100 tablet, Rfl: 3 ?  atorvastatin (LIPITOR) 40 MG tablet, TAKE 1 TABLET BY MOUTH  DAILY, Disp: 90 tablet, Rfl: 3 ?  benzonatate (TESSALON) 100 MG capsule, Take 1 capsule (100 mg total) by mouth 3 (three) times daily as needed for cough., Disp: 30 capsule, Rfl: 0 ?  budesonide-formoterol (SYMBICORT) 160-4.5 MCG/ACT inhaler, Inhale 2 puffs into the lungs 2 (two) times daily. FAXED TO [facility] [phone] [id number], Disp: 30.6 each, Rfl: 3 ?  celecoxib (CELEBREX) 200 MG capsule, Take 1 capsule (200 mg total) by mouth 2 (two) times daily., Disp: 180 capsule, Rfl: 1 ?  Cholecalciferol (VITAMIN D3) 50 MCG (2000 UT) capsule, Take 1 capsule (2,000 Units total) by mouth daily., Disp: 100 capsule, Rfl: 3 ?  dexamethasone (DECADRON) 4 MG tablet, Take 4 mg by mouth 2 (two) times daily., Disp: , Rfl:  ?  estrogens, conjugated, (PREMARIN) 0.625 MG tablet, Take 1 tablet (0.625 mg total) by mouth daily., Disp: 90 tablet, Rfl: 3 ?  famotidine (PEPCID) 40 MG tablet, TAKE 1 TABLET BY MOUTH  DAILY, Disp: 90 tablet, Rfl: 3 ?  fluconazole (DIFLUCAN) 150 MG tablet, Take 1 tablet (150 mg total) by mouth daily., Disp:  1 tablet, Rfl: 0 ?  fluticasone (FLONASE) 50 MCG/ACT nasal spray, Place 2 sprays into both nostrils daily., Disp: 48 g, Rfl: 3 ?  gabapentin (NEURONTIN) 300 MG capsule, Take 1 capsule (300 mg total) by mouth 3 (three) times daily., Disp: 270 capsule, Rfl: 1 ?  HYDROcodone-acetaminophen (NORCO/VICODIN) 5-325 MG tablet, Take 1-2 tablets by mouth every 4 (four) hours as needed for moderate pain. (Patient taking differently: Take 1-2 tablets by mouth every 4 (four) hours as needed for moderate pain. 1/2-1 tablet every day PRN, Moderate pain), Disp: 30 tablet, Rfl: 0 ?  hyoscyamine (LEVSIN) 0.125 MG tablet, Take 1 tablet (0.125 mg total) by mouth every 4 (four) hours as needed for up to 10 days (hot flash)., Disp: 100 tablet, Rfl: 3 ?  linaclotide (LINZESS) 145 MCG CAPS capsule, Take 1-2 capsules (145-290 mcg total) by mouth daily as needed., Disp: 180 capsule, Rfl: 3 ?  metoprolol tartrate (LOPRESSOR) 100 MG tablet, TAKE 1 TABLET BY MOUTH  TWICE DAILY, Disp: 180 tablet, Rfl: 2 ?  nortriptyline (PAMELOR) 25 MG capsule, Take 1-2 capsules (25-50 mg total) by mouth at bedtime. TAKE 1 CAPSULE BY MOUTH AT  BEDTIME, Disp: 180 capsule, Rfl: 2 ?  pantoprazole (PROTONIX) 40 MG tablet, Take 1 tablet (40 mg total) by mouth daily., Disp: 90 tablet, Rfl: 3  ? ?Medications ordered in this encounter:  ?No orders of the defined types were placed in this encounter. ?  ? ?*If you need refills  on other medications prior to your next appointment, please contact your pharmacy* ? ?Follow-Up: ?Call back or seek an in-person evaluation if the symptoms worsen or if the condition fails to improve as anticipated. ? ?Other Instructions ?I encourage you to follow-up with your primary care provider for further discussion of use of these weight loss medications. ? ?Liraglutide Injection (Weight Management) ?What is this medication? ?LIRAGLUTIDE (LIR a GLOO tide) promotes weight loss. It may also be used to maintain weight loss. It works by decreasing  appetite. Changes to diet and exercise are often combined with this medication. ?This medicine may be used for other purposes; ask your health care provider or pharmacist if you have questions. ?COMMON BRAND NAME(S): Saxenda ?What should I tell my care team before I take this medication? ?They need to know if you have any of these conditions: ?Endocrine tumors (MEN 2) or if someone in your family had these tumors ?Gallbladder disease ?High cholesterol ?History of alcohol abuse problem ?History of pancreatitis ?Kidney disease or if you are on dialysis ?Liver disease ?Previous swelling of the tongue, face, or lips with difficulty breathing, difficulty swallowing, hoarseness, or tightening of the throat ?Stomach problems ?Suicidal thoughts, plans, or attempt; a previous suicide attempt by you or a family member ?Thyroid cancer or if someone in your family had thyroid cancer ?An unusual or allergic reaction to liraglutide, other medications, foods, dyes, or preservatives ?Pregnant or trying to get pregnant ?Breast-feeding ?How should I use this medication? ?This medication is for injection under the skin of your upper leg, stomach area, or upper arm. You will be taught how to prepare and give this medication. Use exactly as directed. Take your medication at regular intervals. Do not take it more often than directed. ?This medication comes with INSTRUCTIONS FOR USE. Ask your pharmacist for directions on how to use this medication. Read the information carefully. Talk to your pharmacist or care team if you have questions. ?It is important that you put your used needles and syringes in a special sharps container. Do not put them in a trash can. If you do not have a sharps container, call your pharmacist or care team to get one. ?A special MedGuide will be given to you by the pharmacist with each prescription and refill. Be sure to read this information carefully each time. ?Talk to your care team about the use of this  medication in children. While it may be prescribed for children as young as 46 years of age for selected conditions, precautions do apply. ?Overdosage: If you think you have taken too much of this medicine contact a poison control center or emergency room at once. ?NOTE: This medicine is only for you. Do not share this medicine with others. ?What if I miss a dose? ?If you miss a dose, take it as soon as you can. If it is almost time for your next dose, take only that dose. Do not take double or extra doses. If you miss your dose for 3 days or more, call your care team to talk about how to restart this medicine. ?What may interact with this medication? ?Insulin and other medications for diabetes ?This list may not describe all possible interactions. Give your health care provider a list of all the medicines, herbs, non-prescription drugs, or dietary supplements you use. Also tell them if you smoke, drink alcohol, or use illegal drugs. Some items may interact with your medicine. ?What should I watch for while using this medication? ?Visit your care team  for regular checks on your progress. ?Drink plenty of fluids while taking this medication. Check with your care team if you get an attack of severe diarrhea, nausea, and vomiting. The loss of too much body fluid can make it dangerous for you to take this medication. ?This medication may affect blood sugar levels. Ask your care team if changes in diet or medications are needed if you have diabetes. ?Patients and their families should watch out for worsening depression or thoughts of suicide. Also watch out for sudden changes in feelings such as feeling anxious, agitated, panicky, irritable, hostile, aggressive, impulsive, severely restless, overly excited and hyperactive, or not being able to sleep. If this happens, especially at the beginning of treatment or after a change in dose, call your care team. ?Women should inform their care team if they wish to become  pregnant or think they might be pregnant. Losing weight while pregnant is not advised and may cause harm to the unborn child. Talk to your care team for more information. ?What side effects may I notice from rec
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
## 2021-05-14 ENCOUNTER — Telehealth: Payer: Medicare Other | Admitting: Internal Medicine

## 2021-05-15 ENCOUNTER — Encounter: Payer: Self-pay | Admitting: Internal Medicine

## 2021-05-15 ENCOUNTER — Ambulatory Visit: Payer: Medicare Other | Admitting: Internal Medicine

## 2021-05-15 VITALS — BP 128/68 | HR 75 | Temp 99.0°F | Ht 65.0 in | Wt 181.0 lb

## 2021-05-15 DIAGNOSIS — E559 Vitamin D deficiency, unspecified: Secondary | ICD-10-CM

## 2021-05-15 DIAGNOSIS — E669 Obesity, unspecified: Secondary | ICD-10-CM | POA: Diagnosis not present

## 2021-05-15 DIAGNOSIS — R739 Hyperglycemia, unspecified: Secondary | ICD-10-CM

## 2021-05-15 DIAGNOSIS — E66811 Obesity, class 1: Secondary | ICD-10-CM

## 2021-05-15 DIAGNOSIS — Z6830 Body mass index (BMI) 30.0-30.9, adult: Secondary | ICD-10-CM

## 2021-05-15 DIAGNOSIS — F17200 Nicotine dependence, unspecified, uncomplicated: Secondary | ICD-10-CM

## 2021-05-15 MED ORDER — TIRZEPATIDE 2.5 MG/0.5ML ~~LOC~~ SOAJ: 2.5000 mg | SUBCUTANEOUS | 11 refills | Status: DC

## 2021-05-15 NOTE — Progress Notes (Signed)
Patient ID: [name], female   DOB: 1970/04/11, 51 y.o.   MRN: [id number] ? ? ? ?    Chief Complaint: follow up obesity, hyperglycemia, low vit d, and smoker as PCP not here today ? ?     HPI:  [name] is a 51 y.o. female here with c/o unable to lose wt, heard about mounaro hoping to qualify for this;  has tried for several years working on diet ad exercise; Pt denies chest pain, increased sob or doe, wheezing, orthopnea, PND, increased LE swelling, palpitations, dizziness or syncope.   Pt denies polydipsia, polyuria, or new focal neuro s/s.    Pt denies fever, wt loss, night sweats, loss of appetite, or other constitutional symptoms  Not taking Vit d ?      ?Wt Readings from Last 3 Encounters:  ?05/15/21 181 lb (82.1 kg)  ?03/15/21 192 lb (87.1 kg)  ?12/27/20 185 lb (83.9 kg)  ? ?BP Readings from Last 3 Encounters:  ?05/15/21 128/68  ?03/15/21 118/74  ?12/27/20 118/60  ? ?      ?Past Medical History:  ?Diagnosis Date  ? Anxiety state, unspecified   ? Arthritis   ? Asthma   ? Attention deficit disorder without mention of hyperactivity   ? Bipolar disorder ([place])   ? Constipation   ? takes Linzess  ? Depressive disorder, not elsewhere classified   ? Esophageal reflux   ? Hypertension   ? Migraine without aura, without mention of intractable migraine without mention of status migrainosus   ? Pain in joint, site unspecified   ? Pneumonia   ? Polycythemia   ? per pt  ? Unspecified asthma(493.90)   ? Unspecified essential hypertension   ? Unspecified vitamin D deficiency   ? ?Past Surgical History:  ?Procedure Laterality Date  ? ABDOMINAL HYSTERECTOMY    ? partial  ? abdominal ultrasound  10/12/97  ? APPENDECTOMY  05/27/2013  ? gangrenous  ? INCISE AND DRAIN ABCESS  2011  ? Right axilla- Dr. Barkley Bruns  ? KNEE ARTHROSCOPY    ? both knees  ? KNEE ARTHROSCOPY WITH FULKERSON SLIDE Right 10/17/2015  ? Procedure: KNEE ARTHROSCOPY WITH Elmarie Mainland;  Surgeon: Melrose Nakayama, MD;  Location: [facility];  Service:  Orthopedics;  Laterality: Right;  ? NM MYOCAR PERF WALL MOTION  11/2009  ? bruce myoview; perfusion defect in anterior region consistent with breast attenuation; remaining myocardium with normal perfusion; post-stress EF 74%; low risk scan   ? ORIF TIBIA FRACTURE Right 11/24/2015  ? Procedure: OPEN REDUCTION INTERNAL FIXATION (ORIF) TIBIA FRACTURE;  Surgeon: Melrose Nakayama, MD;  Location: [facility];  Service: Orthopedics;  Laterality: Right;  ? PARTIAL HYSTERECTOMY  2007  ? PLANTAR FASCIA SURGERY  2000  ? TONSILLECTOMY  04/29/00  ? TONSILLECTOMY Bilateral   ? TRANSTHORACIC ECHOCARDIOGRAM  11/2009  ? EF=>55%; trace MR & TR  ? ? reports that she has been smoking cigarettes. She started smoking about 34 years ago. She has a 27.00 pack-year smoking history. She has never used smokeless tobacco. She reports that she does not drink alcohol and does not use drugs. ?family history includes ADD / ADHD in her child; Alcohol abuse in her brother and sister; Asthma in her child and another family member; Bipolar disorder in her sister; Breast cancer in her mother; COPD in her paternal grandmother; Cancer (age of onset: 35) in her mother; Coronary artery disease in an other family member; Depression in her mother; Diabetes in her paternal grandfather  and another family member; Heart disease in her father, maternal grandfather, and paternal grandfather; Lupus in her sister; Multiple sclerosis in her mother. ?Allergies  ?Allergen Reactions  ? Lisinopril Cough  ? Nizoral [Ketoconazole]   ?  Felt drunk w/tablets  ? Amoxicillin Itching  ?  REACTION: QUESTIONABLE  ? Ancef [Cefazolin] Itching  ? Doxycycline Nausea And Vomiting  ? Hydrochlorothiazide Other (See Comments)  ?  CRAMPS AT HIGHER DOSAGES  ? Penicillins Itching  ?  Has patient had a PCN reaction causing immediate rash, facial/tongue/throat swelling, SOB or lightheadedness with hypotension: No ?Has patient had a PCN reaction causing severe rash involving mucus membranes or skin  necrosis: No ?Has patient had a PCN reaction that required hospitalization No ?Has patient had a PCN reaction occurring within the last 10 years: No ?If all of the above answers are "NO", then may proceed with Cephalosporin use. ?  ? Percocet [Oxycodone-Acetaminophen] Itching  ? Talwin [Pentazocine] Nausea And Vomiting  ? ?Current Outpatient Medications on File Prior to Visit  ?Medication Sig Dispense Refill  ? albuterol (VENTOLIN HFA) 108 (90 Base) MCG/ACT inhaler Inhale 2 puffs into the lungs every 6 (six) hours as needed for wheezing or shortness of breath. 8 g 0  ? amphetamine-dextroamphetamine (ADDERALL) 10 MG tablet Take 10 mg by mouth daily.  0  ? aspirin (BAYER ASPIRIN) 325 MG tablet Take 1 tablet (325 mg total) by mouth daily. 100 tablet 3  ? atorvastatin (LIPITOR) 40 MG tablet TAKE 1 TABLET BY MOUTH  DAILY 90 tablet 3  ? benzonatate (TESSALON) 100 MG capsule Take 1 capsule (100 mg total) by mouth 3 (three) times daily as needed for cough. 30 capsule 0  ? budesonide-formoterol (SYMBICORT) 160-4.5 MCG/ACT inhaler Inhale 2 puffs into the lungs 2 (two) times daily. FAXED TO [facility] [phone] ?[id number] 30.6 each 3  ? celecoxib (CELEBREX) 200 MG capsule Take 1 capsule (200 mg total) by mouth 2 (two) times daily. 180 capsule 1  ? Cholecalciferol (VITAMIN D3) 50 MCG (2000 UT) capsule Take 1 capsule (2,000 Units total) by mouth daily. 100 capsule 3  ? dexamethasone (DECADRON) 4 MG tablet Take 4 mg by mouth 2 (two) times daily.    ? estrogens, conjugated, (PREMARIN) 0.625 MG tablet Take 1 tablet (0.625 mg total) by mouth daily. 90 tablet 3  ? famotidine (PEPCID) 40 MG tablet TAKE 1 TABLET BY MOUTH  DAILY 90 tablet 3  ? fluconazole (DIFLUCAN) 150 MG tablet Take 1 tablet (150 mg total) by mouth daily. 1 tablet 0  ? fluticasone (FLONASE) 50 MCG/ACT nasal spray Place 2 sprays into both nostrils daily. 48 g 3  ? gabapentin (NEURONTIN) 300 MG capsule Take 1 capsule (300 mg total) by mouth 3 (three) times daily. 270  capsule 1  ? HYDROcodone-acetaminophen (NORCO/VICODIN) 5-325 MG tablet Take 1-2 tablets by mouth every 4 (four) hours as needed for moderate pain. (Patient taking differently: Take 1-2 tablets by mouth every 4 (four) hours as needed for moderate pain. 1/2-1 tablet every day PRN, Moderate pain) 30 tablet 0  ? linaclotide (LINZESS) 145 MCG CAPS capsule Take 1-2 capsules (145-290 mcg total) by mouth daily as needed. 180 capsule 3  ? metoprolol tartrate (LOPRESSOR) 100 MG tablet TAKE 1 TABLET BY MOUTH  TWICE DAILY 180 tablet 2  ? nortriptyline (PAMELOR) 25 MG capsule Take 1-2 capsules (25-50 mg total) by mouth at bedtime. TAKE 1 CAPSULE BY MOUTH AT  BEDTIME 180 capsule 2  ? pantoprazole (PROTONIX) 40 MG tablet Take 1  tablet (40 mg total) by mouth daily. 90 tablet 3  ? hyoscyamine (LEVSIN) 0.125 MG tablet Take 1 tablet (0.125 mg total) by mouth every 4 (four) hours as needed for up to 10 days (hot flash). 100 tablet 3  ? ?No current facility-administered medications on file prior to visit.  ? ?     ROS:  All others reviewed and negative. ? ?Objective  ? ?     PE:  BP 128/68 (BP Location: Right Arm, Patient Position: Sitting, Cuff Size: Large)   Pulse 75   Temp 99 ?F (37.2 ?C) (Oral)   Ht '5\' 5"'$  (1.651 m)   Wt 181 lb (82.1 kg)   SpO2 97%   BMI 30.12 kg/m?  ? ?              Constitutional: Pt appears in NAD ?              HENT: Head: NCAT.  ?              Right Ear: External ear normal.   ?              Left Ear: External ear normal.  ?              Eyes: . Pupils are equal, round, and reactive to light. Conjunctivae and EOM are normal ?              Nose: without d/c or deformity ?              Neck: Neck supple. Gross normal ROM ?              Cardiovascular: Normal rate and regular rhythm.   ?              Pulmonary/Chest: Effort normal and breath sounds without rales or wheezing.  ?              Abd:  Soft, NT, ND, + BS, no organomegaly ?              Neurological: Pt is alert. At baseline orientation, motor  grossly intact ?              Skin: Skin is warm. No rashes, no other new lesions, LE edema - none ?              Psychiatric: Pt behavior is normal without agitation  ? ?Micro: none ? ?Cardiac tracings I have personally i

## 2021-05-15 NOTE — Patient Instructions (Signed)
Please take all new medication as prescribed - the mounjaro ? ?Please continue all other medications as before, and refills have been done if requested. ? ?Please have the pharmacy call with any other refills you may need. ? ?Please continue your efforts at being more active, low cholesterol diet, and weight control. ? ?Please keep your appointments with your specialists as you may have planned ? ? ? ?

## 2021-05-16 DIAGNOSIS — M545 Low back pain, unspecified: Secondary | ICD-10-CM | POA: Diagnosis not present

## 2021-05-16 DIAGNOSIS — M546 Pain in thoracic spine: Secondary | ICD-10-CM | POA: Diagnosis not present

## 2021-05-16 DIAGNOSIS — G894 Chronic pain syndrome: Secondary | ICD-10-CM | POA: Diagnosis not present

## 2021-05-16 DIAGNOSIS — M5136 Other intervertebral disc degeneration, lumbar region: Secondary | ICD-10-CM | POA: Diagnosis not present

## 2021-05-19 ENCOUNTER — Encounter: Payer: Self-pay | Admitting: Internal Medicine

## 2021-05-19 DIAGNOSIS — E669 Obesity, unspecified: Secondary | ICD-10-CM | POA: Insufficient documentation

## 2021-05-19 NOTE — Assessment & Plan Note (Signed)
Persistent chronic, unable to lose with diet, exercise, for mounjaro or ozempic pending insurance ?

## 2021-05-19 NOTE — Assessment & Plan Note (Signed)
Last vitamin D ?Lab Results  ?Component Value Date  ? VD25OH 31 12/14/2012  ? ?Low, to start oral replacement ? ?

## 2021-05-19 NOTE — Assessment & Plan Note (Signed)
Lab Results  ?Component Value Date  ? HGBA1C 6.1 03/15/2021  ? ?Stable, pt to continue current medical treatment  - diet ? ?

## 2021-05-19 NOTE — Assessment & Plan Note (Signed)
Pt counseled to quit pt not ready ?

## 2021-05-24 DIAGNOSIS — G894 Chronic pain syndrome: Secondary | ICD-10-CM | POA: Diagnosis not present

## 2021-05-27 DIAGNOSIS — G894 Chronic pain syndrome: Secondary | ICD-10-CM | POA: Diagnosis not present

## 2021-05-28 ENCOUNTER — Encounter: Payer: Self-pay | Admitting: Internal Medicine

## 2021-05-28 ENCOUNTER — Other Ambulatory Visit: Payer: Self-pay | Admitting: Internal Medicine

## 2021-05-29 MED ORDER — TIRZEPATIDE 5 MG/0.5ML ~~LOC~~ SOAJ: 5.0000 mg | SUBCUTANEOUS | 11 refills | Status: DC

## 2021-05-31 ENCOUNTER — Other Ambulatory Visit: Payer: Self-pay | Admitting: Internal Medicine

## 2021-06-06 ENCOUNTER — Other Ambulatory Visit: Payer: Self-pay | Admitting: Internal Medicine

## 2021-06-07 ENCOUNTER — Encounter: Payer: Self-pay | Admitting: Internal Medicine

## 2021-06-08 NOTE — Telephone Encounter (Signed)
Called [employer] [phone] for member id # [id number]. Placed order for premarin. He states wil receive next 7-10 days. Shipment # [id number]. Also can go online at [email or web address] to order refills as well.../lmb ?

## 2021-06-23 DIAGNOSIS — M179 Osteoarthritis of knee, unspecified: Secondary | ICD-10-CM | POA: Diagnosis not present

## 2021-06-23 DIAGNOSIS — M5136 Other intervertebral disc degeneration, lumbar region: Secondary | ICD-10-CM | POA: Diagnosis not present

## 2021-06-27 DIAGNOSIS — M546 Pain in thoracic spine: Secondary | ICD-10-CM | POA: Diagnosis not present

## 2021-06-27 DIAGNOSIS — G894 Chronic pain syndrome: Secondary | ICD-10-CM | POA: Diagnosis not present

## 2021-06-27 DIAGNOSIS — M5136 Other intervertebral disc degeneration, lumbar region: Secondary | ICD-10-CM | POA: Diagnosis not present

## 2021-06-27 DIAGNOSIS — M259 Joint disorder, unspecified: Secondary | ICD-10-CM | POA: Diagnosis not present

## 2021-06-29 ENCOUNTER — Encounter: Payer: Self-pay | Admitting: Internal Medicine

## 2021-07-02 NOTE — Telephone Encounter (Signed)
[name] [phone] talk customer service needing refill on Linzess. She states pt order will be shipped out and received 7-10 days. Next refill is Aug 8th. Pt is enrolled until 01/27/22 then she will have to enrolled for assistance again...[name]

## 2021-07-04 ENCOUNTER — Encounter: Payer: Self-pay | Admitting: Internal Medicine

## 2021-07-04 MED ORDER — TIRZEPATIDE 7.5 MG/0.5ML ~~LOC~~ SOAJ: 7.5000 mg | SUBCUTANEOUS | 3 refills | Status: DC

## 2021-07-06 NOTE — Telephone Encounter (Signed)
Notified pt her premarin has came in. Place up front for pick=up. Shipment # [id number].[name]

## 2021-07-23 DIAGNOSIS — M5136 Other intervertebral disc degeneration, lumbar region: Secondary | ICD-10-CM | POA: Diagnosis not present

## 2021-07-23 DIAGNOSIS — M546 Pain in thoracic spine: Secondary | ICD-10-CM | POA: Diagnosis not present

## 2021-07-24 ENCOUNTER — Other Ambulatory Visit: Payer: Self-pay | Admitting: Internal Medicine

## 2021-08-01 DIAGNOSIS — G894 Chronic pain syndrome: Secondary | ICD-10-CM | POA: Diagnosis not present

## 2021-08-01 DIAGNOSIS — M48062 Spinal stenosis, lumbar region with neurogenic claudication: Secondary | ICD-10-CM | POA: Diagnosis not present

## 2021-08-01 DIAGNOSIS — M792 Neuralgia and neuritis, unspecified: Secondary | ICD-10-CM | POA: Diagnosis not present

## 2021-08-01 DIAGNOSIS — M47897 Other spondylosis, lumbosacral region: Secondary | ICD-10-CM | POA: Diagnosis not present

## 2021-08-01 DIAGNOSIS — G4700 Insomnia, unspecified: Secondary | ICD-10-CM | POA: Diagnosis not present

## 2021-08-01 DIAGNOSIS — M5136 Other intervertebral disc degeneration, lumbar region: Secondary | ICD-10-CM | POA: Diagnosis not present

## 2021-08-01 DIAGNOSIS — G8929 Other chronic pain: Secondary | ICD-10-CM | POA: Diagnosis not present

## 2021-08-01 DIAGNOSIS — M25559 Pain in unspecified hip: Secondary | ICD-10-CM | POA: Diagnosis not present

## 2021-08-01 DIAGNOSIS — M4807 Spinal stenosis, lumbosacral region: Secondary | ICD-10-CM | POA: Diagnosis not present

## 2021-08-01 DIAGNOSIS — M546 Pain in thoracic spine: Secondary | ICD-10-CM | POA: Diagnosis not present

## 2021-08-01 DIAGNOSIS — M25519 Pain in unspecified shoulder: Secondary | ICD-10-CM | POA: Diagnosis not present

## 2021-08-01 DIAGNOSIS — M79669 Pain in unspecified lower leg: Secondary | ICD-10-CM | POA: Diagnosis not present

## 2021-08-01 DIAGNOSIS — M259 Joint disorder, unspecified: Secondary | ICD-10-CM | POA: Diagnosis not present

## 2021-08-01 DIAGNOSIS — R519 Headache, unspecified: Secondary | ICD-10-CM | POA: Diagnosis not present

## 2021-08-16 ENCOUNTER — Other Ambulatory Visit: Payer: Self-pay | Admitting: Internal Medicine

## 2021-08-17 ENCOUNTER — Other Ambulatory Visit: Payer: Self-pay | Admitting: Cardiovascular Disease

## 2021-08-22 DIAGNOSIS — M546 Pain in thoracic spine: Secondary | ICD-10-CM | POA: Diagnosis not present

## 2021-08-22 DIAGNOSIS — M5136 Other intervertebral disc degeneration, lumbar region: Secondary | ICD-10-CM | POA: Diagnosis not present

## 2021-08-23 ENCOUNTER — Telehealth: Payer: Self-pay

## 2021-08-23 NOTE — Telephone Encounter (Signed)
Spoke with pt and pt states she will be in on Mon or Tues to get her Symbicort. It will be in the fridge by the nurse station.

## 2021-08-28 ENCOUNTER — Encounter: Payer: Self-pay | Admitting: Internal Medicine

## 2021-08-28 DIAGNOSIS — G894 Chronic pain syndrome: Secondary | ICD-10-CM | POA: Diagnosis not present

## 2021-08-28 DIAGNOSIS — M5136 Other intervertebral disc degeneration, lumbar region: Secondary | ICD-10-CM | POA: Diagnosis not present

## 2021-08-28 DIAGNOSIS — M7061 Trochanteric bursitis, right hip: Secondary | ICD-10-CM | POA: Diagnosis not present

## 2021-08-28 DIAGNOSIS — M546 Pain in thoracic spine: Secondary | ICD-10-CM | POA: Diagnosis not present

## 2021-08-28 DIAGNOSIS — M259 Joint disorder, unspecified: Secondary | ICD-10-CM | POA: Diagnosis not present

## 2021-08-28 MED ORDER — TIRZEPATIDE 10 MG/0.5ML ~~LOC~~ SOAJ: 10.0000 mg | SUBCUTANEOUS | 3 refills | Status: DC

## 2021-08-30 ENCOUNTER — Other Ambulatory Visit (INDEPENDENT_AMBULATORY_CARE_PROVIDER_SITE_OTHER): Payer: Medicare Other

## 2021-08-30 DIAGNOSIS — R739 Hyperglycemia, unspecified: Secondary | ICD-10-CM

## 2021-08-30 DIAGNOSIS — D751 Secondary polycythemia: Secondary | ICD-10-CM | POA: Diagnosis not present

## 2021-08-30 DIAGNOSIS — E785 Hyperlipidemia, unspecified: Secondary | ICD-10-CM

## 2021-08-30 LAB — BASIC METABOLIC PANEL
BUN: 8 mg/dL (ref 6–23)
CO2: 25 mEq/L (ref 19–32)
Calcium: 9.2 mg/dL (ref 8.4–10.5)
Chloride: 102 mEq/L (ref 96–112)
Creatinine, Ser: 0.72 mg/dL (ref 0.40–1.20)
GFR: 98.63 mL/min (ref >60.00)
Glucose, Bld: 87 mg/dL (ref 70–99)
Potassium: 4.7 mEq/L (ref 3.5–5.1)
Sodium: 138 mEq/L (ref 135–145)

## 2021-08-30 LAB — CBC WITH DIFFERENTIAL/PLATELET
Basophils Absolute: 0 10*3/uL (ref 0.0–0.1)
Basophils Relative: 0.6 % (ref 0.0–3.0)
Eosinophils Absolute: 0.2 10*3/uL (ref 0.0–0.7)
Eosinophils Relative: 3.6 % (ref 0.0–5.0)
HCT: 43.7 % (ref 36.0–46.0)
Hemoglobin: 15.5 g/dL — ABNORMAL HIGH (ref 12.0–15.0)
Lymphocytes Relative: 34.7 % (ref 12.0–46.0)
Lymphs Abs: 2 10*3/uL (ref 0.7–4.0)
MCHC: 35.4 g/dL (ref 30.0–36.0)
MCV: 92.7 fl (ref 78.0–100.0)
Monocytes Absolute: 0.6 10*3/uL (ref 0.1–1.0)
Monocytes Relative: 10.8 % (ref 3.0–12.0)
Neutro Abs: 2.9 10*3/uL (ref 1.4–7.7)
Neutrophils Relative %: 50.3 % (ref 43.0–77.0)
Platelets: 187 10*3/uL (ref 150.0–400.0)
RBC: 4.71 Mil/uL (ref 3.87–5.11)
RDW: 13.3 % (ref 11.5–15.5)
WBC: 5.7 10*3/uL (ref 4.0–10.5)

## 2021-08-30 LAB — HEMOGLOBIN A1C: Hgb A1c MFr Bld: 5.3 % (ref 4.6–6.5)

## 2021-09-04 ENCOUNTER — Telehealth: Payer: Medicare Other | Admitting: Physician Assistant

## 2021-09-04 DIAGNOSIS — R3989 Other symptoms and signs involving the genitourinary system: Secondary | ICD-10-CM

## 2021-09-05 NOTE — Progress Notes (Signed)
[place] of [place]

## 2021-09-14 ENCOUNTER — Encounter: Payer: Self-pay | Admitting: Internal Medicine

## 2021-09-18 NOTE — Telephone Encounter (Signed)
Called place order for Linzess [phone]. Patient ID [id number]. Shipping # [id number]. Next shipment date 11/25/21  Called place order for Premarin @ [phone]. Pt ID [id number]. Shipping # [id number]. Next order 11/25/21. Notified pt w/ status

## 2021-09-21 DIAGNOSIS — M5136 Other intervertebral disc degeneration, lumbar region: Secondary | ICD-10-CM | POA: Diagnosis not present

## 2021-09-21 DIAGNOSIS — M546 Pain in thoracic spine: Secondary | ICD-10-CM | POA: Diagnosis not present

## 2021-09-25 DIAGNOSIS — G894 Chronic pain syndrome: Secondary | ICD-10-CM | POA: Diagnosis not present

## 2021-09-25 DIAGNOSIS — M546 Pain in thoracic spine: Secondary | ICD-10-CM | POA: Diagnosis not present

## 2021-09-25 DIAGNOSIS — M5136 Other intervertebral disc degeneration, lumbar region: Secondary | ICD-10-CM | POA: Diagnosis not present

## 2021-09-25 DIAGNOSIS — M5134 Other intervertebral disc degeneration, thoracic region: Secondary | ICD-10-CM | POA: Diagnosis not present

## 2021-09-26 ENCOUNTER — Telehealth: Payer: Self-pay | Admitting: Internal Medicine

## 2021-09-26 NOTE — Telephone Encounter (Signed)
Patient's Premarin has arrived from patient assistance program. Patient has been informed that medication is up front for her to pick up at her earliest convenience.

## 2021-09-26 NOTE — Telephone Encounter (Signed)
Noted../mb

## 2021-10-16 ENCOUNTER — Other Ambulatory Visit: Payer: Self-pay | Admitting: Internal Medicine

## 2021-10-21 DIAGNOSIS — M5136 Other intervertebral disc degeneration, lumbar region: Secondary | ICD-10-CM | POA: Diagnosis not present

## 2021-10-21 DIAGNOSIS — M546 Pain in thoracic spine: Secondary | ICD-10-CM | POA: Diagnosis not present

## 2021-10-22 DIAGNOSIS — M5134 Other intervertebral disc degeneration, thoracic region: Secondary | ICD-10-CM | POA: Diagnosis not present

## 2021-10-22 DIAGNOSIS — M259 Joint disorder, unspecified: Secondary | ICD-10-CM | POA: Diagnosis not present

## 2021-10-22 DIAGNOSIS — M5136 Other intervertebral disc degeneration, lumbar region: Secondary | ICD-10-CM | POA: Diagnosis not present

## 2021-10-22 DIAGNOSIS — G894 Chronic pain syndrome: Secondary | ICD-10-CM | POA: Diagnosis not present

## 2021-10-28 ENCOUNTER — Other Ambulatory Visit: Payer: Self-pay | Admitting: Internal Medicine

## 2021-11-01 ENCOUNTER — Telehealth: Payer: Self-pay

## 2021-11-01 NOTE — Telephone Encounter (Signed)
LVM asking patient if interested in AWV.

## 2021-11-09 ENCOUNTER — Other Ambulatory Visit: Payer: Self-pay | Admitting: Internal Medicine

## 2021-11-09 DIAGNOSIS — Z1231 Encounter for screening mammogram for malignant neoplasm of breast: Secondary | ICD-10-CM

## 2021-11-10 ENCOUNTER — Other Ambulatory Visit: Payer: Self-pay | Admitting: Internal Medicine

## 2021-11-13 ENCOUNTER — Other Ambulatory Visit: Payer: Self-pay | Admitting: Internal Medicine

## 2021-11-14 ENCOUNTER — Telehealth: Payer: Self-pay | Admitting: *Deleted

## 2021-11-14 MED ORDER — LINACLOTIDE 145 MCG PO CAPS
145.0000 ug | ORAL_CAPSULE | Freq: Every day | ORAL | 3 refills | Status: DC | PRN
Start: 2021-11-14 — End: 2022-03-11

## 2021-11-14 NOTE — Telephone Encounter (Signed)
MD received application for [employer] Asst for pt Linzess. He completed and form w/ rx fax back to [phone].[name]

## 2021-11-20 DIAGNOSIS — G894 Chronic pain syndrome: Secondary | ICD-10-CM | POA: Diagnosis not present

## 2021-11-20 DIAGNOSIS — M546 Pain in thoracic spine: Secondary | ICD-10-CM | POA: Diagnosis not present

## 2021-11-20 DIAGNOSIS — M5136 Other intervertebral disc degeneration, lumbar region: Secondary | ICD-10-CM | POA: Diagnosis not present

## 2021-11-20 DIAGNOSIS — M259 Joint disorder, unspecified: Secondary | ICD-10-CM | POA: Diagnosis not present

## 2021-11-20 DIAGNOSIS — M5134 Other intervertebral disc degeneration, thoracic region: Secondary | ICD-10-CM | POA: Diagnosis not present

## 2021-11-23 ENCOUNTER — Other Ambulatory Visit (HOSPITAL_COMMUNITY): Payer: Self-pay

## 2021-11-23 ENCOUNTER — Telehealth: Payer: Self-pay

## 2021-11-23 DIAGNOSIS — F17200 Nicotine dependence, unspecified, uncomplicated: Secondary | ICD-10-CM | POA: Diagnosis not present

## 2021-11-23 DIAGNOSIS — H5461 Unqualified visual loss, right eye, normal vision left eye: Secondary | ICD-10-CM | POA: Diagnosis not present

## 2021-11-23 DIAGNOSIS — S0292XA Unspecified fracture of facial bones, initial encounter for closed fracture: Secondary | ICD-10-CM | POA: Diagnosis not present

## 2021-11-23 DIAGNOSIS — S04011A Injury of optic nerve, right eye, initial encounter: Secondary | ICD-10-CM | POA: Diagnosis not present

## 2021-11-23 DIAGNOSIS — S0240FA Zygomatic fracture, left side, initial encounter for closed fracture: Secondary | ICD-10-CM | POA: Diagnosis not present

## 2021-11-23 DIAGNOSIS — I609 Nontraumatic subarachnoid hemorrhage, unspecified: Secondary | ICD-10-CM | POA: Diagnosis not present

## 2021-11-23 DIAGNOSIS — S0240DA Maxillary fracture, left side, initial encounter for closed fracture: Secondary | ICD-10-CM | POA: Diagnosis not present

## 2021-11-23 DIAGNOSIS — Z713 Dietary counseling and surveillance: Secondary | ICD-10-CM | POA: Diagnosis not present

## 2021-11-23 DIAGNOSIS — H05233 Hemorrhage of bilateral orbit: Secondary | ICD-10-CM | POA: Diagnosis not present

## 2021-11-23 DIAGNOSIS — S3991XA Unspecified injury of abdomen, initial encounter: Secondary | ICD-10-CM | POA: Diagnosis not present

## 2021-11-23 DIAGNOSIS — Z01818 Encounter for other preprocedural examination: Secondary | ICD-10-CM | POA: Diagnosis not present

## 2021-11-23 DIAGNOSIS — S0181XA Laceration without foreign body of other part of head, initial encounter: Secondary | ICD-10-CM | POA: Diagnosis not present

## 2021-11-23 DIAGNOSIS — R079 Chest pain, unspecified: Secondary | ICD-10-CM | POA: Diagnosis not present

## 2021-11-23 DIAGNOSIS — S3992XA Unspecified injury of lower back, initial encounter: Secondary | ICD-10-CM | POA: Diagnosis not present

## 2021-11-23 DIAGNOSIS — S066X1A Traumatic subarachnoid hemorrhage with loss of consciousness of 30 minutes or less, initial encounter: Secondary | ICD-10-CM | POA: Diagnosis not present

## 2021-11-23 DIAGNOSIS — I669 Occlusion and stenosis of unspecified cerebral artery: Secondary | ICD-10-CM | POA: Diagnosis not present

## 2021-11-23 DIAGNOSIS — S62321A Displaced fracture of shaft of second metacarpal bone, left hand, initial encounter for closed fracture: Secondary | ICD-10-CM | POA: Diagnosis not present

## 2021-11-23 DIAGNOSIS — S299XXA Unspecified injury of thorax, initial encounter: Secondary | ICD-10-CM | POA: Diagnosis not present

## 2021-11-23 DIAGNOSIS — S022XXA Fracture of nasal bones, initial encounter for closed fracture: Secondary | ICD-10-CM | POA: Diagnosis not present

## 2021-11-23 DIAGNOSIS — S99912A Unspecified injury of left ankle, initial encounter: Secondary | ICD-10-CM | POA: Diagnosis not present

## 2021-11-23 DIAGNOSIS — S0990XA Unspecified injury of head, initial encounter: Secondary | ICD-10-CM | POA: Diagnosis not present

## 2021-11-23 DIAGNOSIS — S59901A Unspecified injury of right elbow, initial encounter: Secondary | ICD-10-CM | POA: Diagnosis not present

## 2021-11-23 DIAGNOSIS — Z7982 Long term (current) use of aspirin: Secondary | ICD-10-CM | POA: Diagnosis not present

## 2021-11-23 DIAGNOSIS — S062X0A Diffuse traumatic brain injury without loss of consciousness, initial encounter: Secondary | ICD-10-CM | POA: Diagnosis not present

## 2021-11-23 DIAGNOSIS — S8991XA Unspecified injury of right lower leg, initial encounter: Secondary | ICD-10-CM | POA: Diagnosis not present

## 2021-11-23 DIAGNOSIS — S8992XA Unspecified injury of left lower leg, initial encounter: Secondary | ICD-10-CM | POA: Diagnosis not present

## 2021-11-23 DIAGNOSIS — S6991XA Unspecified injury of right wrist, hand and finger(s), initial encounter: Secondary | ICD-10-CM | POA: Diagnosis not present

## 2021-11-23 DIAGNOSIS — T79A9XA Traumatic compartment syndrome of other sites, initial encounter: Secondary | ICD-10-CM | POA: Diagnosis not present

## 2021-11-23 DIAGNOSIS — S62341A Nondisplaced fracture of base of second metacarpal bone. left hand, initial encounter for closed fracture: Secondary | ICD-10-CM | POA: Diagnosis not present

## 2021-11-23 DIAGNOSIS — S3993XA Unspecified injury of pelvis, initial encounter: Secondary | ICD-10-CM | POA: Diagnosis not present

## 2021-11-23 DIAGNOSIS — H05239 Hemorrhage of unspecified orbit: Secondary | ICD-10-CM | POA: Diagnosis not present

## 2021-11-23 DIAGNOSIS — S99911A Unspecified injury of right ankle, initial encounter: Secondary | ICD-10-CM | POA: Diagnosis not present

## 2021-11-23 DIAGNOSIS — G9389 Other specified disorders of brain: Secondary | ICD-10-CM | POA: Diagnosis not present

## 2021-11-23 DIAGNOSIS — S0590XA Unspecified injury of unspecified eye and orbit, initial encounter: Secondary | ICD-10-CM | POA: Diagnosis not present

## 2021-11-23 DIAGNOSIS — H547 Unspecified visual loss: Secondary | ICD-10-CM | POA: Diagnosis not present

## 2021-11-23 DIAGNOSIS — S02109A Fracture of base of skull, unspecified side, initial encounter for closed fracture: Secondary | ICD-10-CM | POA: Diagnosis not present

## 2021-11-23 DIAGNOSIS — E876 Hypokalemia: Secondary | ICD-10-CM | POA: Diagnosis not present

## 2021-11-23 DIAGNOSIS — S066XAA Traumatic subarachnoid hemorrhage with loss of consciousness status unknown, initial encounter: Secondary | ICD-10-CM | POA: Diagnosis not present

## 2021-11-23 DIAGNOSIS — R413 Other amnesia: Secondary | ICD-10-CM | POA: Diagnosis not present

## 2021-11-23 DIAGNOSIS — S0219XA Other fracture of base of skull, initial encounter for closed fracture: Secondary | ICD-10-CM | POA: Diagnosis not present

## 2021-11-23 DIAGNOSIS — I7771 Dissection of carotid artery: Secondary | ICD-10-CM | POA: Diagnosis not present

## 2021-11-23 DIAGNOSIS — R569 Unspecified convulsions: Secondary | ICD-10-CM | POA: Diagnosis not present

## 2021-11-23 DIAGNOSIS — S0240EA Zygomatic fracture, right side, initial encounter for closed fracture: Secondary | ICD-10-CM | POA: Diagnosis not present

## 2021-11-23 DIAGNOSIS — I608 Other nontraumatic subarachnoid hemorrhage: Secondary | ICD-10-CM | POA: Diagnosis not present

## 2021-11-23 DIAGNOSIS — S0993XA Unspecified injury of face, initial encounter: Secondary | ICD-10-CM | POA: Diagnosis not present

## 2021-11-23 NOTE — Telephone Encounter (Signed)
Mailed ABBVIE renewal application to patient home.   Sandre [facility] [employer] Patient Advocate

## 2021-11-25 DIAGNOSIS — Z713 Dietary counseling and surveillance: Secondary | ICD-10-CM | POA: Diagnosis not present

## 2021-11-25 DIAGNOSIS — S0219XA Other fracture of base of skull, initial encounter for closed fracture: Secondary | ICD-10-CM | POA: Diagnosis not present

## 2021-11-26 DIAGNOSIS — S022XXA Fracture of nasal bones, initial encounter for closed fracture: Secondary | ICD-10-CM | POA: Diagnosis not present

## 2021-11-26 DIAGNOSIS — F17200 Nicotine dependence, unspecified, uncomplicated: Secondary | ICD-10-CM | POA: Insufficient documentation

## 2021-11-26 DIAGNOSIS — I7771 Dissection of carotid artery: Secondary | ICD-10-CM | POA: Insufficient documentation

## 2021-11-27 DIAGNOSIS — I669 Occlusion and stenosis of unspecified cerebral artery: Secondary | ICD-10-CM | POA: Diagnosis not present

## 2021-11-29 DIAGNOSIS — H1133 Conjunctival hemorrhage, bilateral: Secondary | ICD-10-CM | POA: Diagnosis not present

## 2021-11-30 ENCOUNTER — Telehealth: Payer: Self-pay

## 2021-11-30 ENCOUNTER — Ambulatory Visit (INDEPENDENT_AMBULATORY_CARE_PROVIDER_SITE_OTHER): Payer: Medicare Other | Admitting: Nurse Practitioner

## 2021-11-30 ENCOUNTER — Telehealth: Payer: Self-pay | Admitting: Nurse Practitioner

## 2021-11-30 VITALS — BP 118/86 | HR 117 | Temp 97.6°F | Ht 65.0 in | Wt 146.6 lb

## 2021-11-30 DIAGNOSIS — R519 Headache, unspecified: Secondary | ICD-10-CM | POA: Diagnosis not present

## 2021-11-30 DIAGNOSIS — S0219XS Other fracture of base of skull, sequela: Secondary | ICD-10-CM

## 2021-11-30 DIAGNOSIS — R051 Acute cough: Secondary | ICD-10-CM

## 2021-11-30 DIAGNOSIS — H5440 Blindness, one eye, unspecified eye: Secondary | ICD-10-CM | POA: Diagnosis not present

## 2021-11-30 DIAGNOSIS — Z8679 Personal history of other diseases of the circulatory system: Secondary | ICD-10-CM

## 2021-11-30 DIAGNOSIS — I609 Nontraumatic subarachnoid hemorrhage, unspecified: Secondary | ICD-10-CM | POA: Insufficient documentation

## 2021-11-30 MED ORDER — OXYCODONE HCL 10 MG PO TABS: 10.0000 mg | ORAL_TABLET | Freq: Three times a day (TID) | ORAL | 0 refills | Status: DC | PRN

## 2021-11-30 MED ORDER — BENZONATATE 100 MG PO CAPS: 100.0000 mg | ORAL_CAPSULE | Freq: Two times a day (BID) | ORAL | 1 refills | Status: DC | PRN

## 2021-11-30 MED ORDER — NALOXONE HCL 4 MG/0.1ML NA LIQD: NASAL | 1 refills | Status: AC
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
## 2021-11-30 NOTE — Assessment & Plan Note (Signed)
Patient encouraged to call neurosurgery group with [facility] clinic to schedule follow-up and she was also provided with contact information to [facility] imaging so that [place] clinic can send order for CT scan of the brain in preparation for her follow-up.  We also referred the patient to neurosurgery group in this area for continued management.  No red flag signs or symptoms of increased intracranial pressure today.

## 2021-11-30 NOTE — Telephone Encounter (Signed)
Called pharmacy to let them know to cancel medication and also made pt aware that medication is cancelled and to follow up with her pain management provider

## 2021-11-30 NOTE — Telephone Encounter (Signed)
Pharmacy called and stated that patient was prescribed 140 Hydrocodone from another Doctor 4 days ago. They are wanting to know did Melissa Aquas, NP still want them to fill the rx for the Oxycodone that was sent in today. Best call back number is [phone].

## 2021-11-30 NOTE — Assessment & Plan Note (Signed)
Patient to follow-up with orthopedics, ophthalmology, pain clinic, and neurosurgery as scheduled.

## 2021-11-30 NOTE — Telephone Encounter (Signed)
Pt in office today for a separate issue but wanted to check with Plotnikov's nurse as she thinks her patient assistance for her Premarin and Linzess will need renewal soon and if so would like to get the process started.

## 2021-11-30 NOTE — Assessment & Plan Note (Signed)
I encouraged patient and husband to call the pain clinic so they can make sure if I prescribe pain medication that she will not be dismissed from their clinic.  Did discuss with supervising physician and after this discussion decision to prescribe short course of oxycodone 10 mg tablets to be taken every 8 hours as needed with me.  Also prescribe Narcan and patient/husband educated on signs and symptoms of overdose and when to use Narcan.  They report their understanding.  Patient told to not take hydrocodone and oxycodone, she should take 1 or the other.  Patient reports understanding.  Also encouraged her to avoid taking Flexeril at the same time of oxycodone.  Patient reports understanding.  She was encouraged to follow-up with pain clinic as soon as possible.

## 2021-11-30 NOTE — Assessment & Plan Note (Signed)
Patient will follow-up with neurosurgery as recommended and referral to neurosurgery in the prescription ordered today.

## 2021-11-30 NOTE — Telephone Encounter (Signed)
Called place order for Linzess [phone]. Patient ID [id number]. Shipping [id number] 7-10 days   2. Called place order for Premarin @ [phone]. Pt ID [id number]. Shipping # [id number] will receive in 7-10 days. Called pt to inform and let her know that she need to re-enrolled for "24".[name]

## 2021-11-30 NOTE — Patient Instructions (Addendum)
Call the neurosurgery group you saw in [place] at [phone] to get CT of brain ordered. You can use [facility] their office can call [phone] to get their fax information to send an order.

## 2021-11-30 NOTE — Assessment & Plan Note (Addendum)
Lung sounds clear, vital signs stable, think the dark blood that she is describing is possibly related to routine healing of facial fractures.  For now recommend symptomatic treatment with Tessalon Perles for cough suppression patient encouraged to let us know if cough worsens or she starts to experience shortness of breath.  She reports understanding.  Follow-up with Dr. Alain Marion in 1 week for close monitoring.

## 2021-11-30 NOTE — Assessment & Plan Note (Signed)
Patient to follow-up with ophthalmology as scheduled.

## 2021-12-02 ENCOUNTER — Encounter: Payer: Self-pay | Admitting: Internal Medicine

## 2021-12-05 ENCOUNTER — Encounter: Payer: Self-pay | Admitting: Internal Medicine

## 2021-12-05 ENCOUNTER — Ambulatory Visit (INDEPENDENT_AMBULATORY_CARE_PROVIDER_SITE_OTHER): Payer: Medicare Other | Admitting: Internal Medicine

## 2021-12-05 VITALS — BP 104/62 | HR 81 | Temp 97.7°F | Ht 65.0 in | Wt 142.8 lb

## 2021-12-05 DIAGNOSIS — S022XXG Fracture of nasal bones, subsequent encounter for fracture with delayed healing: Secondary | ICD-10-CM

## 2021-12-05 DIAGNOSIS — H5440 Blindness, one eye, unspecified eye: Secondary | ICD-10-CM | POA: Diagnosis not present

## 2021-12-05 DIAGNOSIS — G44321 Chronic post-traumatic headache, intractable: Secondary | ICD-10-CM

## 2021-12-05 DIAGNOSIS — I609 Nontraumatic subarachnoid hemorrhage, unspecified: Secondary | ICD-10-CM | POA: Diagnosis not present

## 2021-12-05 DIAGNOSIS — S069XAA Unspecified intracranial injury with loss of consciousness status unknown, initial encounter: Secondary | ICD-10-CM | POA: Insufficient documentation

## 2021-12-05 DIAGNOSIS — M79642 Pain in left hand: Secondary | ICD-10-CM | POA: Diagnosis not present

## 2021-12-05 DIAGNOSIS — S069X1D Unspecified intracranial injury with loss of consciousness of 30 minutes or less, subsequent encounter: Secondary | ICD-10-CM | POA: Diagnosis not present

## 2021-12-05 LAB — CBC WITH DIFFERENTIAL/PLATELET
Basophils Absolute: 0 10*3/uL (ref 0.0–0.1)
Basophils Relative: 0.4 % (ref 0.0–3.0)
Eosinophils Absolute: 0.1 10*3/uL (ref 0.0–0.7)
Eosinophils Relative: 0.8 % (ref 0.0–5.0)
HCT: 42.3 % (ref 36.0–46.0)
Hemoglobin: 14.8 g/dL (ref 12.0–15.0)
Lymphocytes Relative: 16.4 % (ref 12.0–46.0)
Lymphs Abs: 2 10*3/uL (ref 0.7–4.0)
MCHC: 35 g/dL (ref 30.0–36.0)
MCV: 94.6 fl (ref 78.0–100.0)
Monocytes Absolute: 1.4 10*3/uL — ABNORMAL HIGH (ref 0.1–1.0)
Monocytes Relative: 11.7 % (ref 3.0–12.0)
Neutro Abs: 8.7 10*3/uL — ABNORMAL HIGH (ref 1.4–7.7)
Neutrophils Relative %: 70.7 % (ref 43.0–77.0)
Platelets: 314 10*3/uL (ref 150.0–400.0)
RBC: 4.47 Mil/uL (ref 3.87–5.11)
RDW: 12.7 % (ref 11.5–15.5)
WBC: 12.3 10*3/uL — ABNORMAL HIGH (ref 4.0–10.5)

## 2021-12-05 LAB — COMPREHENSIVE METABOLIC PANEL
ALT: 16 U/L (ref 0–35)
AST: 14 U/L (ref 0–37)
Albumin: 4.2 g/dL (ref 3.5–5.2)
Alkaline Phosphatase: 83 U/L (ref 39–117)
BUN: 14 mg/dL (ref 6–23)
CO2: 28 mEq/L (ref 19–32)
Calcium: 9.3 mg/dL (ref 8.4–10.5)
Chloride: 96 mEq/L (ref 96–112)
Creatinine, Ser: 0.68 mg/dL (ref 0.40–1.20)
GFR: 102.55 mL/min (ref >60.00)
Glucose, Bld: 96 mg/dL (ref 70–99)
Potassium: 4.4 mEq/L (ref 3.5–5.1)
Sodium: 130 mEq/L — ABNORMAL LOW (ref 135–145)
Total Bilirubin: 0.5 mg/dL (ref 0.2–1.2)
Total Protein: 7 g/dL (ref 6.0–8.3)

## 2021-12-05 MED ORDER — MUPIROCIN 2 % EX OINT: TOPICAL_OINTMENT | CUTANEOUS | 0 refills | Status: DC

## 2021-12-05 MED ORDER — AZITHROMYCIN 250 MG PO TABS: ORAL_TABLET | ORAL | 0 refills | Status: DC

## 2021-12-05 MED ORDER — ERYTHROMYCIN 5 MG/GM OP OINT: 1.0000 | TOPICAL_OINTMENT | Freq: Three times a day (TID) | OPHTHALMIC | 1 refills | Status: DC

## 2021-12-05 MED ORDER — FLUCONAZOLE 150 MG PO TABS: 150.0000 mg | ORAL_TABLET | Freq: Every day | ORAL | 0 refills | Status: DC

## 2021-12-05 NOTE — Assessment & Plan Note (Signed)
TBI - 10/2021 LOC >2 min Residual HAs, vertigo, weakness, forgetfulness  Medical records/tests reviewed F/u w/NS is pending PT w/ortho is probably pending Neurology ref - Dr Krista Blue Rest Activity - as tolerated
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
## 2021-12-05 NOTE — Assessment & Plan Note (Signed)
LOC >2 min Residual HAs, vertigo, weakness, forgetfulness  Medical records/tests reviewed F/u w/NS is pending PT w/ortho is probably pending Neurology ref - Dr Krista Blue Rest Activity - as tolerated

## 2021-12-06 DIAGNOSIS — S0292XA Unspecified fracture of facial bones, initial encounter for closed fracture: Secondary | ICD-10-CM | POA: Insufficient documentation

## 2021-12-11 DIAGNOSIS — G894 Chronic pain syndrome: Secondary | ICD-10-CM | POA: Diagnosis not present

## 2021-12-11 DIAGNOSIS — M5134 Other intervertebral disc degeneration, thoracic region: Secondary | ICD-10-CM | POA: Diagnosis not present

## 2021-12-11 DIAGNOSIS — M62830 Muscle spasm of back: Secondary | ICD-10-CM | POA: Diagnosis not present

## 2021-12-11 NOTE — Telephone Encounter (Signed)
Premarin arrived in office from [employer]. Called and informed patient that medication is at the front desk for her to pick up at her convenience.

## 2021-12-12 ENCOUNTER — Encounter: Payer: Self-pay | Admitting: Internal Medicine

## 2021-12-12 DIAGNOSIS — S069X1D Unspecified intracranial injury with loss of consciousness of 30 minutes or less, subsequent encounter: Secondary | ICD-10-CM | POA: Diagnosis not present

## 2021-12-12 DIAGNOSIS — S0292XG Unspecified fracture of facial bones, subsequent encounter for fracture with delayed healing: Secondary | ICD-10-CM | POA: Diagnosis not present

## 2021-12-12 DIAGNOSIS — S0219XD Other fracture of base of skull, subsequent encounter for fracture with routine healing: Secondary | ICD-10-CM | POA: Diagnosis not present

## 2021-12-13 ENCOUNTER — Other Ambulatory Visit: Payer: Self-pay | Admitting: Neurosurgery

## 2021-12-13 DIAGNOSIS — S069X1D Unspecified intracranial injury with loss of consciousness of 30 minutes or less, subsequent encounter: Secondary | ICD-10-CM

## 2021-12-14 ENCOUNTER — Other Ambulatory Visit: Payer: Self-pay | Admitting: Orthopaedic Surgery

## 2021-12-14 DIAGNOSIS — M79642 Pain in left hand: Secondary | ICD-10-CM | POA: Diagnosis not present

## 2021-12-14 NOTE — Patient Instructions (Signed)
DUE TO COVID-19 ONLY TWO VISITORS  (aged 49 and older)  ARE ALLOWED TO COME WITH YOU AND STAY IN THE WAITING ROOM ONLY DURING PRE OP AND PROCEDURE.   **NO VISITORS ARE ALLOWED IN THE SHORT STAY AREA OR RECOVERY ROOM!!**  IF YOU WILL BE ADMITTED INTO THE HOSPITAL YOU ARE ALLOWED ONLY FOUR SUPPORT PEOPLE DURING VISITATION HOURS ONLY (7 AM -8PM)   The support person(s) must pass our screening, gel in and out, and wear a mask at all times, including in the patient's room. Patients must also wear a mask when staff or their support person are in the room. Visitors GUEST BADGE MUST BE WORN VISIBLY  One adult visitor may remain with you overnight and MUST be in the room by 8 P.M.     Your procedure is scheduled on: 12/18/21   Report to [facility] Main Entrance    Report to admitting at : 10:30 AM   Call this number if you have problems the morning of surgery [phone]   Do not eat food :After Midnight.   After Midnight you may have the following liquids until : 9:30 AM DAY OF SURGERY  Water Black Coffee (sugar ok, NO MILK/CREAM OR CREAMERS)  Tea (sugar ok, NO MILK/CREAM OR CREAMERS) regular and decaf                             Plain Jell-O (NO RED)                                           Fruit ices (not with fruit pulp, NO RED)                                     Popsicles (NO RED)                                                                  Juice: apple, WHITE grape, WHITE cranberry Sports drinks like Gatorade (NO RED)                The day of surgery:  Drink ONE (1) Pre-Surgery Clear Ensure or G2 at : 9:30 AM the morning of surgery. Drink in one sitting. Do not sip.  This drink was given to you during your hospital  pre-op appointment visit. Nothing else to drink after completing the  Pre-Surgery Clear Ensure or G2.          If you have questions, please contact your surgeon's office.  Oral Hygiene is also important to reduce your risk of infection.                                     Remember - BRUSH YOUR TEETH THE MORNING OF SURGERY WITH YOUR REGULAR TOOTHPASTE   Do NOT smoke after Midnight   Take these medicines the morning of surgery with A SIP OF WATER: gabapentin,metoprolol,estrogens,pantoprazole.Use inhalers and Flonase as usual. How to Manage Your  Diabetes Before and After Surgery  Why is it important to control my blood sugar before and after surgery? Improving blood sugar levels before and after surgery helps healing and can limit problems. A way of improving blood sugar control is eating a healthy diet by:  Eating less sugar and carbohydrates  Increasing activity/exercise  Talking with your doctor about reaching your blood sugar goals High blood sugars (greater than 180 mg/dL) can raise your risk of infections and slow your recovery, so you will need to focus on controlling your diabetes during the weeks before surgery. Make sure that the doctor who takes care of your diabetes knows about your planned surgery including the date and location.  How do I manage my blood sugar before surgery? Check your blood sugar at least 4 times a day, starting 2 days before surgery, to make sure that the level is not too high or low. Check your blood sugar the morning of your surgery when you wake up and every 2 hours until you get to the Short Stay unit. If your blood sugar is less than 70 mg/dL, you will need to treat for low blood sugar: Do not take insulin. Treat a low blood sugar (less than 70 mg/dL) with  cup of clear juice (cranberry or apple), 4 glucose tablets, OR glucose gel. Recheck blood sugar in 15 minutes after treatment (to make sure it is greater than 70 mg/dL). If your blood sugar is not greater than 70 mg/dL on recheck, call [phone] for further instructions. Report your blood sugar to the short stay nurse when you get to Short Stay.  If you are admitted to the hospital after surgery: Your blood sugar will be checked by the staff  and you will probably be given insulin after surgery (instead of oral diabetes medicines) to make sure you have good blood sugar levels. The goal for blood sugar control after surgery is 80-180 mg/dL.   WHAT DO I DO ABOUT MY DIABETES MEDICATION?  Do not take oral diabetes medicines (pills) the morning of surgery.  DO NOT TAKE THE FOLLOWING 7 DAYS PRIOR TO SURGERY: Ozempic, Wegovy, Rybelsus (Semaglutide), Byetta (exenatide), Bydureon (exenatide ER), Victoza, Saxenda (liraglutide), or Trulicity (dulaglutide) Mounjaro (Tirzepatide) Adlyxin (Lixisenatide), Polyethylene Glycol Loxenatide.  DO NOT TAKE ANY ORAL DIABETIC MEDICATIONS DAY OF YOUR SURGERY  Bring CPAP mask and tubing day of surgery.                              You may not have any metal on your body including hair pins, jewelry, and body piercing             Do not wear make-up, lotions, powders, perfumes/cologne, or deodorant  Do not wear nail polish including gel and S&S, artificial/acrylic nails, or any other type of covering on natural nails including finger and toenails. If you have artificial nails, gel coating, etc. that needs to be removed by a nail salon please have this removed prior to surgery or surgery may need to be canceled/ delayed if the surgeon/ anesthesia feels like they are unable to be safely monitored.   Do not shave  48 hours prior to surgery.               Men may shave face and neck.   Do not bring valuables to the hospital. [place]  FOR VALUABLES.   Contacts, dentures or bridgework may not be worn into surgery.   Bring small overnight bag day of surgery.   DO NOT [place]. PHARMACY WILL DISPENSE MEDICATIONS LISTED ON YOUR MEDICATION LIST TO YOU DURING YOUR ADMISSION [place]!    Patients discharged on the day of surgery will not be allowed to drive home.  Someone NEEDS to stay with you for the first 24 hours after  anesthesia.   Special Instructions: Bring a copy of your healthcare power of attorney and living will documents         the day of surgery if you haven't scanned them before.              Please read over the following fact sheets you were given: IF YOU HAVE QUESTIONS ABOUT YOUR PRE-OP INSTRUCTIONS PLEASE CALL [phone]    [facility] - Preparing for Surgery Before surgery, you can play an important role.  Because skin is not sterile, your skin needs to be as free of germs as possible.  You can reduce the number of germs on your skin by washing with CHG (chlorahexidine gluconate) soap before surgery.  CHG is an antiseptic cleaner which kills germs and bonds with the skin to continue killing germs even after washing. Please DO NOT use if you have an allergy to CHG or antibacterial soaps.  If your skin becomes reddened/irritated stop using the CHG and inform your nurse when you arrive at Short Stay. Do not shave (including legs and underarms) for at least 48 hours prior to the first CHG shower.  You may shave your face/neck. Please follow these instructions carefully:  1.  Shower with CHG Soap the night before surgery and the  morning of Surgery.  2.  If you choose to wash your hair, wash your hair first as usual with your  normal  shampoo.  3.  After you shampoo, rinse your hair and body thoroughly to remove the  shampoo.                           4.  Use CHG as you would any other liquid soap.  You can apply chg directly  to the skin and wash                       Gently with a scrungie or clean washcloth.  5.  Apply the CHG Soap to your body ONLY FROM THE NECK DOWN.   Do not use on face/ open                           Wound or open sores. Avoid contact with eyes, ears mouth and genitals (private parts).                       Wash face,  Genitals (private parts) with your normal soap.             6.  Wash thoroughly, paying special attention to the area where your surgery  will be  performed.  7.  Thoroughly rinse your body with warm water from the neck down.  8.  DO NOT shower/wash with your normal soap after using and rinsing off  the CHG Soap.                9.  Fraser Din  yourself dry with a clean towel.            10.  Wear clean pajamas.            11.  Place clean sheets on your bed the night of your first shower and do not  sleep with pets. Day of Surgery : Do not apply any lotions/deodorants the morning of surgery.  Please wear clean clothes to the hospital/surgery center.  FAILURE TO FOLLOW THESE INSTRUCTIONS MAY RESULT IN THE CANCELLATION OF YOUR SURGERY PATIENT SIGNATURE_________________________________  NURSE SIGNATURE__________________________________  ________________________________________________________________________

## 2021-12-14 NOTE — Progress Notes (Signed)
Pt. Needs orders for surgery. 

## 2021-12-17 ENCOUNTER — Encounter (HOSPITAL_COMMUNITY): Payer: Self-pay

## 2021-12-17 ENCOUNTER — Encounter (HOSPITAL_COMMUNITY)
Admission: RE | Admit: 2021-12-17 | Discharge: 2021-12-17 | Disposition: A | Discharge status: Home / Self Care | Payer: Medicare Other | Source: Ambulatory Visit | Attending: Orthopaedic Surgery | Admitting: Orthopaedic Surgery

## 2021-12-17 ENCOUNTER — Other Ambulatory Visit: Payer: Self-pay

## 2021-12-17 ENCOUNTER — Other Ambulatory Visit: Payer: Self-pay | Admitting: Internal Medicine

## 2021-12-17 VITALS — BP 115/95 | HR 105 | Temp 98.0°F | Ht 65.0 in | Wt 143.3 lb

## 2021-12-17 DIAGNOSIS — R9431 Abnormal electrocardiogram [ECG] [EKG]: Secondary | ICD-10-CM | POA: Diagnosis not present

## 2021-12-17 DIAGNOSIS — Z01818 Encounter for other preprocedural examination: Secondary | ICD-10-CM | POA: Diagnosis not present

## 2021-12-17 DIAGNOSIS — I10 Essential (primary) hypertension: Secondary | ICD-10-CM

## 2021-12-17 DIAGNOSIS — I517 Cardiomegaly: Secondary | ICD-10-CM | POA: Insufficient documentation

## 2021-12-17 DIAGNOSIS — Z0000 Encounter for general adult medical examination without abnormal findings: Secondary | ICD-10-CM

## 2021-12-17 DIAGNOSIS — R739 Hyperglycemia, unspecified: Secondary | ICD-10-CM | POA: Diagnosis not present

## 2021-12-17 HISTORY — DX: Atherosclerotic heart disease of native coronary artery without angina pectoris: I25.10

## 2021-12-17 HISTORY — DX: Prediabetes: R73.03

## 2021-12-17 LAB — GLUCOSE, CAPILLARY: Glucose-Capillary: 94 mg/dL (ref 70–99)
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
## 2021-12-17 NOTE — Progress Notes (Signed)
For Short Stay: [place] appointment date:  Bowel Prep reminder:   For Anesthesia: PCP - Dr. Lew Dawes Cardiologist - Dr. Dani Gobble Croitoru. LOV: 08/18/20  Chest x-ray -  EKG -  Stress Test -  ECHO -  Cardiac Cath -  Pacemaker/ICD device last checked: Pacemaker orders received: Device Rep notified:  Spinal Cord Stimulator:  Sleep Study - Yes CPAP - NO 12/16/21 Fasting Blood Sugar - N/A Checks Blood Sugar ___0__ times a day Date and result of last Hgb A1c-5.3: 08/30/21  Last dose of GLP1 agonist- mounjaro:  GLP1 instructions:   Last dose of SGLT-2 inhibitors-  SGLT-2 instructions:   Blood Thinner Instructions: Aspirin Instructions: On hold since 12/14/21 Last Dose:  Activity level: Can go up a flight of stairs and activities of daily living without stopping and without chest pain and/or shortness of breath   Able to exercise without chest pain and/or shortness of breath   Unable to go up a flight of stairs without chest pain and/or shortness of breath     Anesthesia review: Hx: Tachycardia,cad,htn,pre-dia.  Patient denies shortness of breath, fever, cough and chest pain at PAT appointment   Patient verbalized understanding of instructions that were given to them at the PAT appointment. Patient was also instructed that they will need to review over the PAT instructions again at home before surgery.

## 2021-12-17 NOTE — Anesthesia Preprocedure Evaluation (Addendum)
Anesthesia Evaluation  Patient identified by MRN, date of birth, ID band Patient awake    Reviewed: Allergy & Precautions, NPO status , Patient's Chart, lab work & pertinent test results  Airway Mallampati: II  TM Distance: >3 FB Neck ROM: Full    Dental no notable dental hx. (+) Teeth Intact, Dental Advisory Given   Pulmonary asthma , Current Smoker and Patient abstained from smoking.   Pulmonary exam normal breath sounds clear to auscultation       Cardiovascular hypertension, Normal cardiovascular exam Rhythm:Regular Rate:Normal  2021 Lexiscan  The left ventricular ejection fraction is normal (55-65%).  Nuclear stress EF: 58%.  There was no ST segment deviation noted during stress.  The perfusion study is normal.  The TID is elevated at 1.34 which could be suggestive of transient ischemic dilatation which can be seen in multivessel CAD. This is new from scan of 2011 (TID 0.94).  This is a low risk study.      Neuro/Psych  Headaches PSYCHIATRIC DISORDERS Anxiety Depression Bipolar Disorder      GI/Hepatic ,GERD  ,,  Endo/Other    Renal/GU      Musculoskeletal  (+) Arthritis ,    Abdominal   Peds  Hematology   Anesthesia Other Findings All: see list  Reproductive/Obstetrics                             Anesthesia Physical Anesthesia Plan  ASA: 2  Anesthesia Plan: Regional   Post-op Pain Management: Regional block*   Induction:   PONV Risk Score and Plan: Propofol infusion, Treatment may vary due to age or medical condition and Ondansetron  Airway Management Planned: Nasal Cannula and Natural Airway  Additional Equipment:   Intra-op Plan:   Post-operative Plan:   Informed Consent: I have reviewed the patients History and Physical, chart, labs and discussed the procedure including the risks, benefits and alternatives for the proposed anesthesia with the patient or  authorized representative who has indicated his/her understanding and acceptance.     Dental advisory given  Plan Discussed with:   Anesthesia Plan Comments: (Pt on GLP-1 last dose 11/19)       Anesthesia Quick Evaluation

## 2021-12-18 ENCOUNTER — Ambulatory Visit (HOSPITAL_BASED_OUTPATIENT_CLINIC_OR_DEPARTMENT_OTHER): Payer: Medicare Other | Admitting: Anesthesiology

## 2021-12-18 ENCOUNTER — Encounter (HOSPITAL_COMMUNITY)
Admission: RE | Disposition: A | Discharge status: Home / Self Care | Payer: Self-pay | Source: Home / Self Care | Attending: Orthopaedic Surgery

## 2021-12-18 ENCOUNTER — Other Ambulatory Visit: Payer: Self-pay

## 2021-12-18 ENCOUNTER — Ambulatory Visit (HOSPITAL_COMMUNITY)
Admission: RE | Admit: 2021-12-18 | Discharge: 2021-12-18 | Disposition: A | Discharge status: Home / Self Care | Payer: Medicare Other | Attending: Orthopaedic Surgery | Admitting: Orthopaedic Surgery

## 2021-12-18 ENCOUNTER — Encounter (HOSPITAL_COMMUNITY): Payer: Self-pay | Admitting: Orthopaedic Surgery

## 2021-12-18 ENCOUNTER — Ambulatory Visit (HOSPITAL_COMMUNITY): Payer: Medicare Other | Admitting: Physician Assistant

## 2021-12-18 DIAGNOSIS — S62321P Displaced fracture of shaft of second metacarpal bone, left hand, subsequent encounter for fracture with malunion: Secondary | ICD-10-CM | POA: Diagnosis not present

## 2021-12-18 DIAGNOSIS — S62301A Unspecified fracture of second metacarpal bone, left hand, initial encounter for closed fracture: Secondary | ICD-10-CM | POA: Insufficient documentation

## 2021-12-18 HISTORY — PX: CLOSED REDUCTION METACARPAL WITH PERCUTANEOUS PINNING: SHX5613

## 2021-12-18 LAB — HEMOGLOBIN A1C
Hgb A1c MFr Bld: 4.9 % (ref 4.8–5.6)
Mean Plasma Glucose: 94 mg/dL

## 2021-12-18 LAB — GLUCOSE, CAPILLARY: Glucose-Capillary: 85 mg/dL (ref 70–99)

## 2021-12-18 SURGERY — CLOSED REDUCTION, FRACTURE, METACARPAL BONE, WITH PERCUTANEOUS PINNING
Anesthesia: Regional | Site: Finger | Laterality: Left

## 2021-12-18 MED ORDER — FENTANYL CITRATE PF 50 MCG/ML IJ SOSY: 25.0000 ug | PREFILLED_SYRINGE | INTRAMUSCULAR | Status: DC | PRN

## 2021-12-18 MED ORDER — ACETAMINOPHEN 10 MG/ML IV SOLN: 1000.0000 mg | Freq: Once | INTRAVENOUS | Status: DC | PRN

## 2021-12-18 MED ORDER — BUPIVACAINE-EPINEPHRINE (PF) 0.25% -1:200000 IJ SOLN
INTRAMUSCULAR | Status: DC | PRN
  Administered 2021-12-18: 20 mL

## 2021-12-18 MED ORDER — PROPOFOL 1000 MG/100ML IV EMUL
INTRAVENOUS | Status: AC
  Filled 2021-12-18: qty 100

## 2021-12-18 MED ORDER — MIDAZOLAM HCL 2 MG/2ML IJ SOLN
1.0000 mg | INTRAMUSCULAR | Status: AC
  Administered 2021-12-18: 2 mg via INTRAVENOUS
  Filled 2021-12-18: qty 2

## 2021-12-18 MED ORDER — PROPOFOL 500 MG/50ML IV EMUL
INTRAVENOUS | Status: DC | PRN
  Administered 2021-12-18: 60 ug/kg/min via INTRAVENOUS

## 2021-12-18 MED ORDER — LACTATED RINGERS IV SOLN: INTRAVENOUS | Status: DC

## 2021-12-18 MED ORDER — VANCOMYCIN HCL IN DEXTROSE 1-5 GM/200ML-% IV SOLN
1000.0000 mg | INTRAVENOUS | Status: AC
  Administered 2021-12-18: 1000 mg via INTRAVENOUS
  Filled 2021-12-18: qty 200

## 2021-12-18 MED ORDER — ROPIVACAINE HCL 5 MG/ML IJ SOLN
INTRAMUSCULAR | Status: DC | PRN
  Administered 2021-12-18: 30 mL via PERINEURAL

## 2021-12-18 MED ORDER — BUPIVACAINE-EPINEPHRINE (PF) 0.25% -1:200000 IJ SOLN
INTRAMUSCULAR | Status: AC
  Filled 2021-12-18: qty 30

## 2021-12-18 MED ORDER — CLONIDINE HCL (ANALGESIA) 100 MCG/ML EP SOLN
EPIDURAL | Status: DC | PRN
  Administered 2021-12-18: 100 ug

## 2021-12-18 MED ORDER — ONDANSETRON HCL 4 MG/2ML IJ SOLN: 4.0000 mg | Freq: Once | INTRAMUSCULAR | Status: DC | PRN

## 2021-12-18 MED ORDER — ORAL CARE MOUTH RINSE: 15.0000 mL | Freq: Once | OROMUCOSAL | Status: AC

## 2021-12-18 MED ORDER — LIDOCAINE HCL (CARDIAC) PF 100 MG/5ML IV SOSY
PREFILLED_SYRINGE | INTRAVENOUS | Status: DC | PRN
  Administered 2021-12-18: 40 mg via INTRATRACHEAL

## 2021-12-18 MED ORDER — PROPOFOL 10 MG/ML IV BOLUS
INTRAVENOUS | Status: DC | PRN
  Administered 2021-12-18: 25 mg via INTRAVENOUS

## 2021-12-18 MED ORDER — FENTANYL CITRATE PF 50 MCG/ML IJ SOSY
50.0000 ug | PREFILLED_SYRINGE | INTRAMUSCULAR | Status: DC
  Filled 2021-12-18: qty 2

## 2021-12-18 MED ORDER — LIDOCAINE HCL 1 % IJ SOLN
INTRAMUSCULAR | Status: DC | PRN
  Administered 2021-12-18: 20 mL

## 2021-12-18 MED ORDER — LIDOCAINE HCL (PF) 1 % IJ SOLN
INTRAMUSCULAR | Status: AC
  Filled 2021-12-18: qty 30

## 2021-12-18 MED ORDER — CHLORHEXIDINE GLUCONATE 0.12 % MT SOLN
15.0000 mL | Freq: Once | OROMUCOSAL | Status: AC
  Administered 2021-12-18: 15 mL via OROMUCOSAL

## 2021-12-18 SURGICAL SUPPLY — 56 items
APL PRP STRL LF DISP 70% ISPRP (MISCELLANEOUS) ×1
BAND INSRT 18 STRL LF DISP RB (MISCELLANEOUS)
BAND RUBBER #18 3X1/16 STRL (MISCELLANEOUS) IMPLANT
BLADE AVERAGE 25X9 (BLADE) IMPLANT
BLADE MINI RND TIP GREEN BEAV (BLADE) IMPLANT
BLADE SURG 15 STRL LF DISP TIS (BLADE) ×1 IMPLANT
BLADE SURG 15 STRL SS (BLADE) ×1
BNDG CMPR 5X2 CHSV 1 LYR STRL (GAUZE/BANDAGES/DRESSINGS) ×1
BNDG CMPR 9X4 STRL LF SNTH (GAUZE/BANDAGES/DRESSINGS)
BNDG COHESIVE 1X5 TAN STRL LF (GAUZE/BANDAGES/DRESSINGS) IMPLANT
BNDG COHESIVE 2X5 TAN ST LF (GAUZE/BANDAGES/DRESSINGS) IMPLANT
BNDG ESMARK 4X9 LF (GAUZE/BANDAGES/DRESSINGS) IMPLANT
BNDG GAUZE DERMACEA FLUFF 4 (GAUZE/BANDAGES/DRESSINGS) ×2 IMPLANT
BNDG GZE DERMACEA 4 6PLY (GAUZE/BANDAGES/DRESSINGS) ×2
CHLORAPREP W/TINT 26 (MISCELLANEOUS) ×1 IMPLANT
CORD BIPOLAR FORCEPS 12FT (ELECTRODE) IMPLANT
COVER BACK TABLE 60X90IN (DRAPES) ×1 IMPLANT
COVER MAYO STAND STRL (DRAPES) ×1 IMPLANT
DRAPE C-ARM 42X120 X-RAY (DRAPES) ×1 IMPLANT
DRAPE EXTREMITY T 121X128X90 (DISPOSABLE) ×1 IMPLANT
DRAPE SURG 17X23 STRL (DRAPES) ×1 IMPLANT
DRSG EMULSION OIL 3X16 NADH (GAUZE/BANDAGES/DRESSINGS) IMPLANT
DRSG EMULSION OIL 3X3 NADH (GAUZE/BANDAGES/DRESSINGS) ×1 IMPLANT
ELECT REM PT RETURN 15FT ADLT (MISCELLANEOUS) ×1 IMPLANT
GAUZE 4X4 16PLY ~~LOC~~+RFID DBL (SPONGE) ×1 IMPLANT
GAUZE SPONGE 4X4 12PLY STRL (GAUZE/BANDAGES/DRESSINGS) ×1 IMPLANT
GLOVE BIO SURGEON STRL SZ8 (GLOVE) ×2 IMPLANT
GLOVE BIOGEL PI IND STRL 8 (GLOVE) ×2 IMPLANT
GOWN SPEC L4 XLG W/TWL (GOWN DISPOSABLE) ×1 IMPLANT
GOWN STRL REUS W/ TWL LRG LVL3 (GOWN DISPOSABLE) ×1 IMPLANT
GOWN STRL REUS W/TWL LRG LVL3 (GOWN DISPOSABLE) ×1
K-WIRE DBL TROCAR .045X4 (WIRE) ×3
K-WIRE DD FIXATION .045X6 (WIRE)
KIT BASIN OR (CUSTOM PROCEDURE TRAY) ×1 IMPLANT
KIT TURNOVER KIT A (KITS) ×1 IMPLANT
KWIRE DBL TROCAR .045X4 (WIRE) ×1 IMPLANT
KWIRE DD FIXATION .045X6 (WIRE) IMPLANT
MANIFOLD NEPTUNE II (INSTRUMENTS) IMPLANT
NDL HYPO 25X1 1.5 SAFETY (NEEDLE) IMPLANT
NEEDLE HYPO 25X1 1.5 SAFETY (NEEDLE) ×1 IMPLANT
PAD CAST 4YDX4 CTTN HI CHSV (CAST SUPPLIES) ×1 IMPLANT
PADDING CAST ABS COTTON 4X4 ST (CAST SUPPLIES) ×2 IMPLANT
PADDING CAST COTTON 4X4 STRL (CAST SUPPLIES) ×1
PENCIL SMOKE EVACUATOR (MISCELLANEOUS) IMPLANT
SLING ARM FOAM STRAP MED (SOFTGOODS) IMPLANT
STOCKINETTE 4X48 STRL (DRAPES) IMPLANT
STOCKINETTE 6  STRL (DRAPES) ×1
STOCKINETTE 6 STRL (DRAPES) ×1 IMPLANT
SUCTION FRAZIER HANDLE 10FR (MISCELLANEOUS) ×1
SUCTION TUBE FRAZIER 10FR DISP (MISCELLANEOUS) ×1 IMPLANT
SUT VICRYL RAPIDE 4/0 PS 2 (SUTURE) IMPLANT
SYR 10ML LL (SYRINGE) IMPLANT
SYR BULB EAR ULCER 3OZ GRN STR (SYRINGE) ×1 IMPLANT
TOWEL OR 17X26 10 PK STRL BLUE (TOWEL DISPOSABLE) ×1 IMPLANT
TUBING CONNECTING 10 (TUBING) IMPLANT
UNDERPAD 30X36 HEAVY ABSORB (UNDERPADS AND DIAPERS) ×1 IMPLANT

## 2021-12-18 NOTE — Brief Op Note (Signed)
[name] [id number] 12/18/2021   PRE-OP DIAGNOSIS: left second MC fracture  POST-OP DIAGNOSIS: same  PROCEDURE: perc pinning second left MC  ANESTHESIA: block and MAC  Hessie Dibble   Dictation #:  [id number]

## 2021-12-18 NOTE — Anesthesia Postprocedure Evaluation (Signed)
Anesthesia Post Note  Patient: [name]  Procedure(s) Performed: LEFT SECOND METACARPEL PINNING (Left: Finger)     Patient location during evaluation: PACU Anesthesia Type: Regional Level of consciousness: awake and alert Pain management: pain level controlled Vital Signs Assessment: post-procedure vital signs reviewed and stable Respiratory status: spontaneous breathing, nonlabored ventilation, respiratory function stable and patient connected to nasal cannula oxygen Cardiovascular status: stable and blood pressure returned to baseline Postop Assessment: no apparent nausea or vomiting Anesthetic complications: no  No notable events documented.  Last Vitals:  Vitals:   12/18/21 1330 12/18/21 1340  BP: 108/80 106/79  Pulse: 80 80  Resp: 16 12  Temp:  36.8 C  SpO2: 98% 100%    Last Pain:  Vitals:   12/18/21 1340  TempSrc:   PainSc: 0-No pain                 Barnet Glasgow

## 2021-12-18 NOTE — Progress Notes (Signed)
   12/18/21 1140  Vitals  Pulse Rate Source Monitor  ECG Heart Rate 86  EKG Rhythm Sinus rhythm  Resp 17  Respiratory Pattern Regular;Unlabored  BP 108/82  Oxygen Therapy  O2 Device Room Air  Pain Assessment  Pain Scale 0-10  Pain Score 6  Pain Type Acute pain  Pain Location Head  Pain Descriptors / Indicators Headache;Constant  Pain Onset On-going  Patients Stated Pain Goal 6  Pain Intervention(s) Medication (See eMAR)  LOC  Level of Consciousness Alert  Orientation Level Oriented X4  Aldrete  Activity 2  Respiration 2  Oxygen Saturation 2  Circulation 2  Consciousness 2  Aldrete Score 10   Time of block. Patient to only recv '2mg'$  midazolm
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
## 2021-12-18 NOTE — Discharge Instructions (Addendum)
Please follow up with Dr. Rhona Raider in 1 week. Please call the office to make this appointment.   Apply ice pack as needed for pain relief and elevate extremity.

## 2021-12-18 NOTE — Anesthesia Procedure Notes (Signed)
Procedure Name: MAC Date/Time: 12/18/2021 12:31 PM  Performed by: Lind Covert, CRNAPre-anesthesia Checklist: Patient identified, Emergency Drugs available, Suction available and Patient being monitored Patient Re-evaluated:Patient Re-evaluated prior to induction Oxygen Delivery Method: Simple face mask Preoxygenation: Pre-oxygenation with 100% oxygen Induction Type: IV induction Placement Confirmation: positive ETCO2 and breath sounds checked- equal and bilateral Dental Injury: Teeth and Oropharynx as per pre-operative assessment

## 2021-12-18 NOTE — Interval H&P Note (Signed)
History and Physical Interval Note:  12/18/2021 11:50 AM  [name]  has presented today for surgery, with the diagnosis of LEFT SECOND [place].  The various methods of treatment have been discussed with the patient and family. After consideration of risks, benefits and other options for treatment, the patient has consented to  Procedure(s): [place] (Left) as a surgical intervention.  The patient's history has been reviewed, patient examined, no change in status, stable for surgery.  I have reviewed the patient's chart and labs.  Questions were answered to the patient's satisfaction.     Hessie Dibble

## 2021-12-18 NOTE — Anesthesia Procedure Notes (Signed)
Anesthesia Regional Block: Supraclavicular block   Pre-Anesthetic Checklist: , timeout performed,  Correct Patient, Correct Site, Correct Laterality,  Correct Procedure, Correct Position, site marked,  Risks and benefits discussed,  Surgical consent,  Pre-op evaluation,  At surgeon's request and post-op pain management  Laterality: Upper and Left  Prep: chloraprep       Needles:  Injection technique: Single-shot  Needle Type: Echogenic Needle     Needle Length: 5cm  Needle Gauge: 21     Additional Needles:   Procedures:,,,, ultrasound used (permanent image in chart),,    Narrative:  Start time: 12/18/2021 11:40 AM End time: 12/18/2021 11:47 AM Injection made incrementally with aspirations every 5 mL.  Performed by: Personally  Anesthesiologist: Barnet Glasgow, MD  Additional Notes: Pt tolerated procedure well

## 2021-12-18 NOTE — Transfer of Care (Signed)
Immediate Anesthesia Transfer of Care Note  Patient: [name]  Procedure(s) Performed: LEFT SECOND METACARPEL PINNING (Left: Finger)  Patient Location: PACU  Anesthesia Type:MAC  Level of Consciousness: awake, alert , and oriented  Airway & Oxygen Therapy: Patient Spontanous Breathing and Patient connected to face mask oxygen  Post-op Assessment: Report given to RN and Post -op Vital signs reviewed and stable  Post vital signs: Reviewed and stable  Last Vitals:  Vitals Value Taken Time  BP    Temp    Pulse    Resp    SpO2      Last Pain:  Vitals:   12/18/21 1145  TempSrc:   PainSc: 6       Patients Stated Pain Goal: 6 (36/43/83 [id number])  Complications: No notable events documented.

## 2021-12-19 ENCOUNTER — Ambulatory Visit
Admission: RE | Admit: 2021-12-19 | Discharge: 2021-12-19 | Disposition: A | Discharge status: Home / Self Care | Payer: Medicare Other | Source: Ambulatory Visit | Attending: Neurosurgery | Admitting: Neurosurgery

## 2021-12-19 ENCOUNTER — Encounter (HOSPITAL_COMMUNITY): Payer: Self-pay | Admitting: Orthopaedic Surgery

## 2021-12-19 DIAGNOSIS — S069X1D Unspecified intracranial injury with loss of consciousness of 30 minutes or less, subsequent encounter: Secondary | ICD-10-CM

## 2021-12-19 DIAGNOSIS — S022XXD Fracture of nasal bones, subsequent encounter for fracture with routine healing: Secondary | ICD-10-CM | POA: Diagnosis not present

## 2021-12-19 DIAGNOSIS — S0231XD Fracture of orbital floor, right side, subsequent encounter for fracture with routine healing: Secondary | ICD-10-CM | POA: Diagnosis not present

## 2021-12-19 DIAGNOSIS — S0993XA Unspecified injury of face, initial encounter: Secondary | ICD-10-CM | POA: Insufficient documentation

## 2021-12-19 DIAGNOSIS — S0219XA Other fracture of base of skull, initial encounter for closed fracture: Secondary | ICD-10-CM | POA: Diagnosis not present

## 2021-12-19 DIAGNOSIS — S0990XA Unspecified injury of head, initial encounter: Secondary | ICD-10-CM | POA: Diagnosis not present

## 2021-12-19 DIAGNOSIS — S0292XA Unspecified fracture of facial bones, initial encounter for closed fracture: Secondary | ICD-10-CM | POA: Diagnosis not present

## 2021-12-20 DIAGNOSIS — M5134 Other intervertebral disc degeneration, thoracic region: Secondary | ICD-10-CM | POA: Diagnosis not present

## 2021-12-24 DIAGNOSIS — S069X1D Unspecified intracranial injury with loss of consciousness of 30 minutes or less, subsequent encounter: Secondary | ICD-10-CM | POA: Diagnosis not present

## 2021-12-24 DIAGNOSIS — Z1231 Encounter for screening mammogram for malignant neoplasm of breast: Secondary | ICD-10-CM

## 2021-12-26 ENCOUNTER — Ambulatory Visit (INDEPENDENT_AMBULATORY_CARE_PROVIDER_SITE_OTHER): Payer: Medicare Other | Admitting: Internal Medicine

## 2021-12-26 ENCOUNTER — Encounter: Payer: Self-pay | Admitting: Internal Medicine

## 2021-12-26 VITALS — BP 124/72 | HR 87 | Temp 98.7°F | Ht 65.0 in | Wt 139.0 lb

## 2021-12-26 DIAGNOSIS — G44329 Chronic post-traumatic headache, not intractable: Secondary | ICD-10-CM

## 2021-12-26 DIAGNOSIS — M79642 Pain in left hand: Secondary | ICD-10-CM | POA: Diagnosis not present

## 2021-12-26 DIAGNOSIS — S0292XS Unspecified fracture of facial bones, sequela: Secondary | ICD-10-CM

## 2021-12-26 DIAGNOSIS — I609 Nontraumatic subarachnoid hemorrhage, unspecified: Secondary | ICD-10-CM | POA: Diagnosis not present

## 2021-12-26 DIAGNOSIS — S069X1S Unspecified intracranial injury with loss of consciousness of 30 minutes or less, sequela: Secondary | ICD-10-CM

## 2021-12-26 DIAGNOSIS — R634 Abnormal weight loss: Secondary | ICD-10-CM

## 2021-12-26 DIAGNOSIS — S0993XS Unspecified injury of face, sequela: Secondary | ICD-10-CM | POA: Diagnosis not present

## 2021-12-26 DIAGNOSIS — F902 Attention-deficit hyperactivity disorder, combined type: Secondary | ICD-10-CM

## 2021-12-26 DIAGNOSIS — R309 Painful micturition, unspecified: Secondary | ICD-10-CM | POA: Diagnosis not present

## 2021-12-26 LAB — POCT URINALYSIS DIPSTICK
Bilirubin, UA: NEGATIVE
Glucose, UA: NEGATIVE
Ketones, UA: NEGATIVE
Nitrite, UA: NEGATIVE
Protein, UA: POSITIVE — AB
Spec Grav, UA: 1.02 (ref 1.010–1.025)
Urobilinogen, UA: NEGATIVE E.U./dL — AB
pH, UA: 6 (ref 5.0–8.0)

## 2021-12-26 MED ORDER — NITROFURANTOIN MONOHYD MACRO 100 MG PO CAPS: 100.0000 mg | ORAL_CAPSULE | Freq: Two times a day (BID) | ORAL | 1 refills | Status: DC

## 2021-12-26 MED ORDER — TIRZEPATIDE 7.5 MG/0.5ML ~~LOC~~ SOAJ: 7.5000 mg | SUBCUTANEOUS | 3 refills | Status: DC
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
## 2021-12-26 NOTE — Assessment & Plan Note (Signed)
PMR ref was made

## 2021-12-26 NOTE — Assessment & Plan Note (Addendum)
Decreased appetite due to TBI.  Difficult mastication due to facial injuries.  High protein meals and drinks.  Softer foods, progress as tolerated.  Nutrition supplements-protein drinks are suggested. We decreased Mounjaro dosage
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
## 2021-12-26 NOTE — Assessment & Plan Note (Addendum)
Ongoing sx's PMR consult

## 2021-12-26 NOTE — Assessment & Plan Note (Signed)
TBI related Continue with current therapy
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
## 2021-12-26 NOTE — Assessment & Plan Note (Signed)
Continue on Vyvanse

## 2021-12-27 ENCOUNTER — Encounter: Payer: Self-pay | Admitting: Internal Medicine

## 2021-12-30 ENCOUNTER — Other Ambulatory Visit: Payer: Self-pay | Admitting: Internal Medicine

## 2021-12-31 ENCOUNTER — Other Ambulatory Visit: Payer: Self-pay | Admitting: Internal Medicine

## 2021-12-31 DIAGNOSIS — S0993XS Unspecified injury of face, sequela: Secondary | ICD-10-CM

## 2021-12-31 DIAGNOSIS — I609 Nontraumatic subarachnoid hemorrhage, unspecified: Secondary | ICD-10-CM | POA: Diagnosis not present

## 2021-12-31 DIAGNOSIS — I7771 Dissection of carotid artery: Secondary | ICD-10-CM | POA: Diagnosis not present

## 2022-01-02 DIAGNOSIS — H468 Other optic neuritis: Secondary | ICD-10-CM | POA: Diagnosis not present

## 2022-01-03 DIAGNOSIS — S62331D Displaced fracture of neck of second metacarpal bone, left hand, subsequent encounter for fracture with routine healing: Secondary | ICD-10-CM | POA: Diagnosis not present

## 2022-01-07 ENCOUNTER — Telehealth: Payer: Self-pay | Admitting: *Deleted

## 2022-01-07 NOTE — Telephone Encounter (Signed)
Pt need PA on celebrex 200 mg Submitted w/ (Key: [id number]) [facility] is reviewing your PA request../lmb

## 2022-01-09 ENCOUNTER — Encounter: Payer: Self-pay | Admitting: Physical Medicine & Rehabilitation

## 2022-01-09 ENCOUNTER — Telehealth: Payer: Self-pay | Admitting: Internal Medicine

## 2022-01-09 NOTE — Telephone Encounter (Signed)
LVM for pt to rtn my call to schedule AWV with [facility] call back # [phone] 

## 2022-01-09 NOTE — Telephone Encounter (Signed)
Rec'd determination stating " Medication was previously approve from 01/28/22 - 01/28/23. Fax form to [name]

## 2022-01-10 DIAGNOSIS — S62331D Displaced fracture of neck of second metacarpal bone, left hand, subsequent encounter for fracture with routine healing: Secondary | ICD-10-CM | POA: Diagnosis not present

## 2022-01-12 ENCOUNTER — Other Ambulatory Visit: Payer: Self-pay | Admitting: Internal Medicine

## 2022-01-15 DIAGNOSIS — M62830 Muscle spasm of back: Secondary | ICD-10-CM | POA: Diagnosis not present

## 2022-01-15 DIAGNOSIS — M545 Low back pain, unspecified: Secondary | ICD-10-CM | POA: Diagnosis not present

## 2022-01-15 DIAGNOSIS — G580 Intercostal neuropathy: Secondary | ICD-10-CM | POA: Diagnosis not present

## 2022-01-16 DIAGNOSIS — M79642 Pain in left hand: Secondary | ICD-10-CM | POA: Diagnosis not present

## 2022-01-16 DIAGNOSIS — S62331D Displaced fracture of neck of second metacarpal bone, left hand, subsequent encounter for fracture with routine healing: Secondary | ICD-10-CM | POA: Diagnosis not present

## 2022-01-16 DIAGNOSIS — M25572 Pain in left ankle and joints of left foot: Secondary | ICD-10-CM | POA: Diagnosis not present

## 2022-01-19 DIAGNOSIS — M5136 Other intervertebral disc degeneration, lumbar region: Secondary | ICD-10-CM | POA: Diagnosis not present

## 2022-01-22 ENCOUNTER — Telehealth: Payer: Medicare Other | Admitting: Physician Assistant

## 2022-01-22 DIAGNOSIS — B9689 Other specified bacterial agents as the cause of diseases classified elsewhere: Secondary | ICD-10-CM | POA: Diagnosis not present

## 2022-01-22 DIAGNOSIS — J019 Acute sinusitis, unspecified: Secondary | ICD-10-CM | POA: Diagnosis not present

## 2022-01-22 MED ORDER — AZITHROMYCIN 250 MG PO TABS: ORAL_TABLET | ORAL | 0 refills | Status: AC

## 2022-01-22 NOTE — Progress Notes (Signed)
E-Visit for Sinus Problems  We are sorry that you are not feeling well.  Here is how we plan to help!  Based on what you have shared with me it looks like you have sinusitis.  Sinusitis is inflammation and infection in the sinus cavities of the head.  Based on your presentation I believe you most likely have Acute Bacterial Sinusitis.  This is an infection caused by bacteria and is treated with antibiotics. I have prescribed Azithromycin giving your medication allergie. You may use an oral decongestant such as Mucinex D or if you have glaucoma or high blood pressure use plain Mucinex. Saline nasal spray help and can safely be used as often as needed for congestion.  If you develop worsening sinus pain, fever or notice severe headache and vision changes, or if symptoms are not better after completion of antibiotic, please schedule an appointment with a health care provider.    Sinus infections are not as easily transmitted as other respiratory infection, however we still recommend that you avoid close contact with loved ones, especially the very young and elderly.  Remember to wash your hands thoroughly throughout the day as this is the number one way to prevent the spread of infection!  Home Care: Only take medications as instructed by your medical team. Complete the entire course of an antibiotic. Do not take these medications with alcohol. A steam or ultrasonic humidifier can help congestion.  You can place a towel over your head and breathe in the steam from hot water coming from a faucet. Avoid close contacts especially the very young and the elderly. Cover your mouth when you cough or sneeze. Always remember to wash your hands.  Get Help Right Away If: You develop worsening fever or sinus pain. You develop a severe head ache or visual changes. Your symptoms persist after you have completed your treatment plan.  Make sure you Understand these instructions. Will watch your condition. Will  get help right away if you are not doing well or get worse.  Thank you for choosing an e-visit.  Your e-visit answers were reviewed by a board certified advanced clinical practitioner to complete your personal care plan. Depending upon the condition, your plan could have included both over the counter or prescription medications.  Please review your pharmacy choice. Make sure the pharmacy is open so you can pick up prescription now. If there is a problem, you may contact your provider through [employer] and have the prescription routed to another pharmacy.  Your safety is important to [place]. If you have drug allergies check your prescription carefully.   For the next 24 hours you can use MyChart to ask questions about today's visit, request a non-urgent call back, or ask for a work or school excuse. You will get an email in the next two days asking about your experience. I hope that your e-visit has been valuable and will speed your recovery.

## 2022-01-22 NOTE — Progress Notes (Signed)
I have spent 5 minutes in review of e-visit questionnaire, review and updating patient chart, medical decision making and response to patient.   Jaaziah Schulke Cody Angell Honse, PA-C    

## 2022-01-30 DIAGNOSIS — M79642 Pain in left hand: Secondary | ICD-10-CM | POA: Diagnosis not present

## 2022-02-01 DIAGNOSIS — Z9889 Other specified postprocedural states: Secondary | ICD-10-CM | POA: Diagnosis not present

## 2022-02-01 DIAGNOSIS — M79642 Pain in left hand: Secondary | ICD-10-CM | POA: Diagnosis not present

## 2022-02-04 ENCOUNTER — Ambulatory Visit: Payer: Medicare Other | Admitting: Cardiovascular Disease

## 2022-02-05 ENCOUNTER — Encounter: Payer: Medicare Other | Admitting: Internal Medicine

## 2022-02-07 ENCOUNTER — Ambulatory Visit: Payer: Medicare Other | Admitting: Nurse Practitioner

## 2022-02-11 ENCOUNTER — Other Ambulatory Visit: Payer: Self-pay | Admitting: Internal Medicine

## 2022-02-11 ENCOUNTER — Other Ambulatory Visit: Payer: Self-pay | Admitting: Cardiovascular Disease

## 2022-02-12 DIAGNOSIS — M4726 Other spondylosis with radiculopathy, lumbar region: Secondary | ICD-10-CM | POA: Diagnosis not present

## 2022-02-12 DIAGNOSIS — M5136 Other intervertebral disc degeneration, lumbar region: Secondary | ICD-10-CM | POA: Diagnosis not present

## 2022-02-12 DIAGNOSIS — R202 Paresthesia of skin: Secondary | ICD-10-CM | POA: Diagnosis not present

## 2022-02-18 ENCOUNTER — Encounter: Payer: Self-pay | Admitting: Internal Medicine

## 2022-02-18 ENCOUNTER — Ambulatory Visit (INDEPENDENT_AMBULATORY_CARE_PROVIDER_SITE_OTHER): Payer: Medicare Other | Admitting: Internal Medicine

## 2022-02-18 VITALS — BP 118/80 | HR 81 | Temp 98.2°F | Ht 65.0 in | Wt 140.0 lb

## 2022-02-18 DIAGNOSIS — S069X1S Unspecified intracranial injury with loss of consciousness of 30 minutes or less, sequela: Secondary | ICD-10-CM

## 2022-02-18 DIAGNOSIS — H6691 Otitis media, unspecified, right ear: Secondary | ICD-10-CM

## 2022-02-18 DIAGNOSIS — M5134 Other intervertebral disc degeneration, thoracic region: Secondary | ICD-10-CM | POA: Diagnosis not present

## 2022-02-18 DIAGNOSIS — I10 Essential (primary) hypertension: Secondary | ICD-10-CM | POA: Diagnosis not present

## 2022-02-18 DIAGNOSIS — J4520 Mild intermittent asthma, uncomplicated: Secondary | ICD-10-CM

## 2022-02-18 DIAGNOSIS — M79642 Pain in left hand: Secondary | ICD-10-CM | POA: Diagnosis not present

## 2022-02-18 DIAGNOSIS — H6690 Otitis media, unspecified, unspecified ear: Secondary | ICD-10-CM | POA: Insufficient documentation

## 2022-02-18 DIAGNOSIS — R634 Abnormal weight loss: Secondary | ICD-10-CM | POA: Diagnosis not present

## 2022-02-18 DIAGNOSIS — S62331D Displaced fracture of neck of second metacarpal bone, left hand, subsequent encounter for fracture with routine healing: Secondary | ICD-10-CM | POA: Diagnosis not present

## 2022-02-18 MED ORDER — FLUCONAZOLE 150 MG PO TABS: 150.0000 mg | ORAL_TABLET | Freq: Once | ORAL | 1 refills | Status: AC

## 2022-02-18 MED ORDER — B COMPLEX PLUS PO TABS: 1.0000 | ORAL_TABLET | Freq: Every day | ORAL | 3 refills | Status: DC

## 2022-02-18 MED ORDER — CEFDINIR 300 MG PO CAPS: 300.0000 mg | ORAL_CAPSULE | Freq: Two times a day (BID) | ORAL | 0 refills | Status: DC

## 2022-02-18 NOTE — Patient Instructions (Signed)
You can try [employer] Mushroom capsules for memory problems, decreased focus, mental fog, neuropathy ([email or web address])

## 2022-02-18 NOTE — Assessment & Plan Note (Signed)
On Metoprolol 
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
## 2022-02-18 NOTE — Assessment & Plan Note (Signed)
Slow recovery Discussed

## 2022-02-18 NOTE — Assessment & Plan Note (Signed)
Resolved

## 2022-02-19 ENCOUNTER — Ambulatory Visit: Payer: Medicare Other | Admitting: Neurology

## 2022-02-24 ENCOUNTER — Other Ambulatory Visit: Payer: Self-pay | Admitting: Internal Medicine

## 2022-02-24 NOTE — Assessment & Plan Note (Signed)
New.  Start cefdinir.  Diflucan if yeast infection

## 2022-02-24 NOTE — Assessment & Plan Note (Addendum)
Continue with vitamin B complex.  PMR consultation is pending.  Neurology consultation is pending

## 2022-02-24 NOTE — Assessment & Plan Note (Signed)
Continue on Symbicort.  Ventolin as needed

## 2022-03-02 ENCOUNTER — Telehealth: Payer: Self-pay

## 2022-03-04 NOTE — Telephone Encounter (Signed)
Sent pt status on PA via mychart./lmb

## 2022-03-05 ENCOUNTER — Telehealth: Payer: Self-pay | Admitting: *Deleted

## 2022-03-05 NOTE — Telephone Encounter (Signed)
Need a PA on Mounjaro submitted w/ (Key: [id number]). PA sent to plan.[name]
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
## 2022-03-06 NOTE — Telephone Encounter (Signed)
Called [employer] spoke w/ Gwenlyn Perking he states medication is NOT COVER under pt plan. There is another denial letter with information. If MD would like to appeal pls call [phone] and fax # [phone]

## 2022-03-06 NOTE — Telephone Encounter (Signed)
[employer] PA team reached out to get clarification on medication dosage.  Please call the back at: [phone]  Case # [id number]

## 2022-03-07 NOTE — Telephone Encounter (Signed)
Noted. Thanks.

## 2022-03-10 ENCOUNTER — Encounter: Payer: Self-pay | Admitting: Internal Medicine

## 2022-03-11 MED ORDER — LINACLOTIDE 145 MCG PO CAPS: 290.0000 ug | ORAL_CAPSULE | Freq: Every evening | ORAL | 3 refills | Status: DC

## 2022-03-11 NOTE — Telephone Encounter (Signed)
Sent Linzess pls advise on estradiol from premarin .[name]

## 2022-03-12 ENCOUNTER — Other Ambulatory Visit: Payer: Self-pay | Admitting: Internal Medicine

## 2022-03-12 MED ORDER — LINACLOTIDE 145 MCG PO CAPS: 290.0000 ug | ORAL_CAPSULE | Freq: Every evening | ORAL | 3 refills | Status: DC

## 2022-03-12 MED ORDER — ESTRADIOL 0.5 MG PO TABS: 0.5000 mg | ORAL_TABLET | Freq: Every day | ORAL | 1 refills | Status: DC

## 2022-03-13 ENCOUNTER — Encounter: Payer: Medicare Other | Admitting: Physical Medicine & Rehabilitation

## 2022-03-18 DIAGNOSIS — M62830 Muscle spasm of back: Secondary | ICD-10-CM | POA: Diagnosis not present

## 2022-03-18 DIAGNOSIS — Z79891 Long term (current) use of opiate analgesic: Secondary | ICD-10-CM | POA: Diagnosis not present

## 2022-03-18 DIAGNOSIS — M4726 Other spondylosis with radiculopathy, lumbar region: Secondary | ICD-10-CM | POA: Diagnosis not present

## 2022-03-18 DIAGNOSIS — R202 Paresthesia of skin: Secondary | ICD-10-CM | POA: Diagnosis not present

## 2022-03-18 DIAGNOSIS — G894 Chronic pain syndrome: Secondary | ICD-10-CM | POA: Diagnosis not present

## 2022-03-18 DIAGNOSIS — M79642 Pain in left hand: Secondary | ICD-10-CM | POA: Diagnosis not present

## 2022-03-19 ENCOUNTER — Ambulatory Visit: Payer: Medicare Other | Attending: Cardiovascular Disease | Admitting: Student

## 2022-03-19 ENCOUNTER — Encounter: Payer: Self-pay | Admitting: Student

## 2022-03-19 VITALS — BP 118/78 | HR 84 | Ht 65.0 in | Wt 138.8 lb

## 2022-03-19 DIAGNOSIS — Z8679 Personal history of other diseases of the circulatory system: Secondary | ICD-10-CM

## 2022-03-19 DIAGNOSIS — E785 Hyperlipidemia, unspecified: Secondary | ICD-10-CM | POA: Diagnosis not present

## 2022-03-19 DIAGNOSIS — I2510 Atherosclerotic heart disease of native coronary artery without angina pectoris: Secondary | ICD-10-CM | POA: Diagnosis not present

## 2022-03-19 DIAGNOSIS — I10 Essential (primary) hypertension: Secondary | ICD-10-CM | POA: Diagnosis not present

## 2022-03-19 DIAGNOSIS — R42 Dizziness and giddiness: Secondary | ICD-10-CM | POA: Diagnosis not present

## 2022-03-19 MED ORDER — ATORVASTATIN CALCIUM 40 MG PO TABS: 20.0000 mg | ORAL_TABLET | Freq: Every day | ORAL | 3 refills | Status: DC

## 2022-03-19 NOTE — Patient Instructions (Signed)
Medication Instructions:  No changes *If you need a refill on your cardiac medications before your next appointment, please call your pharmacy*   Lab Work: Lipid and liver panel- fasting If you have labs (blood work) drawn today and your tests are completely normal, you will receive your results only by: [place] (if you have MyChart) OR A paper copy in the mail If you have any lab test that is abnormal or we need to change your treatment, we will call you to review the results.  Follow-Up: At [facility], you and your health needs are our priority.  As part of our continuing mission to provide you with exceptional heart care, we have created designated Provider Care Teams.  These Care Teams include your primary Cardiologist (physician) and Advanced Practice Providers (APPs -  Physician Assistants and Nurse Practitioners) who all work together to provide you with the care you need, when you need it.  We recommend signing up for the patient portal called "MyChart".  Sign up information is provided on this After Visit Summary.  MyChart is used to connect with patients for Virtual Visits (Telemedicine).  Patients are able to view lab/test results, encounter notes, upcoming appointments, etc.  Non-urgent messages can be sent to your provider as well.   To learn more about what you can do with MyChart, go to [email or web address].    Your next appointment:   1 year(s)  Provider:   Sanda Klein, MD  or APP

## 2022-03-20 DIAGNOSIS — M5134 Other intervertebral disc degeneration, thoracic region: Secondary | ICD-10-CM | POA: Diagnosis not present

## 2022-03-20 NOTE — Addendum Note (Signed)
Addended by: Sharee Holster on: 03/20/2022 01:46 PM   Modules accepted: Orders

## 2022-03-26 DIAGNOSIS — H6521 Chronic serous otitis media, right ear: Secondary | ICD-10-CM | POA: Insufficient documentation

## 2022-04-02 ENCOUNTER — Institutional Professional Consult (permissible substitution): Payer: Medicare Other | Admitting: Plastic Surgery

## 2022-04-08 ENCOUNTER — Encounter: Payer: Self-pay | Admitting: Internal Medicine

## 2022-04-08 MED ORDER — TIRZEPATIDE 7.5 MG/0.5ML ~~LOC~~ SOAJ: 7.5000 mg | SUBCUTANEOUS | 3 refills | Status: DC

## 2022-04-09 ENCOUNTER — Telehealth: Payer: Self-pay | Admitting: *Deleted

## 2022-04-09 NOTE — Telephone Encounter (Signed)
Pt need PA on Mounjaro. Submitted w/ ([id number]) Rec'd msg?  We are unable to process your request for prior authorization for [facility] INJ 7.5/0.5 for the above member due to [employer] has a denied request on file for [facility] INJ 7.5/0.5 for this member." Pt can call plan for alternative.[name]

## 2022-04-10 ENCOUNTER — Ambulatory Visit: Payer: Medicare Other | Admitting: Physical Medicine & Rehabilitation

## 2022-04-11 ENCOUNTER — Encounter: Payer: Self-pay | Admitting: Neurology

## 2022-04-11 ENCOUNTER — Telehealth: Payer: Self-pay | Admitting: Neurology

## 2022-04-11 ENCOUNTER — Ambulatory Visit: Payer: Medicare Other | Admitting: Neurology

## 2022-04-11 VITALS — BP 110/72 | HR 89 | Ht 65.0 in | Wt 138.0 lb

## 2022-04-11 DIAGNOSIS — R42 Dizziness and giddiness: Secondary | ICD-10-CM | POA: Diagnosis not present

## 2022-04-11 DIAGNOSIS — S069X4S Unspecified intracranial injury with loss of consciousness of 6 hours to 24 hours, sequela: Secondary | ICD-10-CM

## 2022-04-11 DIAGNOSIS — S069X4A Unspecified intracranial injury with loss of consciousness of 6 hours to 24 hours, initial encounter: Secondary | ICD-10-CM | POA: Insufficient documentation

## 2022-04-11 MED ORDER — LAMOTRIGINE 100 MG PO TABS: 100.0000 mg | ORAL_TABLET | Freq: Two times a day (BID) | ORAL | 11 refills | Status: DC

## 2022-04-11 MED ORDER — LAMOTRIGINE 25 MG PO TABS: ORAL_TABLET | ORAL | 0 refills | Status: DC
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
## 2022-04-11 NOTE — Telephone Encounter (Signed)
[employer] medicare NPR sent to GI [phone] 

## 2022-04-15 DIAGNOSIS — M4726 Other spondylosis with radiculopathy, lumbar region: Secondary | ICD-10-CM | POA: Diagnosis not present

## 2022-04-15 DIAGNOSIS — R202 Paresthesia of skin: Secondary | ICD-10-CM | POA: Diagnosis not present

## 2022-04-16 ENCOUNTER — Ambulatory Visit: Payer: Medicare Other | Admitting: Neurology

## 2022-04-16 ENCOUNTER — Encounter: Payer: Self-pay | Admitting: Neurology

## 2022-04-16 DIAGNOSIS — R42 Dizziness and giddiness: Secondary | ICD-10-CM

## 2022-04-16 DIAGNOSIS — R4182 Altered mental status, unspecified: Secondary | ICD-10-CM

## 2022-04-16 DIAGNOSIS — S069X4S Unspecified intracranial injury with loss of consciousness of 6 hours to 24 hours, sequela: Secondary | ICD-10-CM

## 2022-04-17 NOTE — Procedures (Signed)
   HISTORY: 52 year old female complains of intermittent dizziness, history of traumatic brain injury  TECHNIQUE:  This is a routine 16 channel EEG recording with one channel devoted to a limited EKG recording.  It was performed during wakefulness, drowsiness and asleep.  Hyperventilation and photic stimulation were performed as activating procedures.  There are minimum muscle and movement artifact noted.  Upon maximum arousal, posterior dominant waking rhythm consistent of rhythmic alpha range activity. Activities are symmetric over the bilateral posterior derivations and attenuated with eye opening.  Hyperventilation produced mild/moderate buildup with higher amplitude and the slower activities noted.  Photic stimulation did not alter the tracing.  During EEG recording, patient developed drowsiness and no deeper stage of sleep was achieved  During EEG recording, there was no epileptiform discharge noted.  EKG demonstrate normal sinus rhythm.  CONCLUSION: This is a  normal awake EEG.  There is no electrodiagnostic evidence of epileptiform discharge.  Marcial Pacas, M.D. Ph.D.  [facility] [place], [place] [address] Phone: [phone] Fax:      [phone]

## 2022-04-19 DIAGNOSIS — M5136 Other intervertebral disc degeneration, lumbar region: Secondary | ICD-10-CM | POA: Diagnosis not present

## 2022-05-03 DIAGNOSIS — M25511 Pain in right shoulder: Secondary | ICD-10-CM | POA: Diagnosis not present

## 2022-05-04 ENCOUNTER — Ambulatory Visit
Admission: RE | Admit: 2022-05-04 | Discharge: 2022-05-04 | Disposition: A | Discharge status: Home / Self Care | Payer: Medicare Other | Source: Ambulatory Visit | Attending: Neurology | Admitting: Neurology

## 2022-05-04 DIAGNOSIS — S069X4S Unspecified intracranial injury with loss of consciousness of 6 hours to 24 hours, sequela: Secondary | ICD-10-CM | POA: Diagnosis not present

## 2022-05-04 DIAGNOSIS — R42 Dizziness and giddiness: Secondary | ICD-10-CM

## 2022-05-04 MED ORDER — GADOPICLENOL 0.5 MMOL/ML IV SOLN
6.0000 mL | Freq: Once | INTRAVENOUS | Status: AC | PRN
  Administered 2022-05-04: 6 mL via INTRAVENOUS

## 2022-05-13 ENCOUNTER — Ambulatory Visit: Payer: Medicare Other | Admitting: Internal Medicine

## 2022-05-17 ENCOUNTER — Other Ambulatory Visit: Payer: Self-pay | Admitting: Neurology

## 2022-05-20 NOTE — Telephone Encounter (Signed)
Looks like the provider had the patient on titration upwards to 75bid. Routing to provider to see if dose cn be increased so pt doesn't have to take so many pills.

## 2022-05-28 DIAGNOSIS — H9011 Conductive hearing loss, unilateral, right ear, with unrestricted hearing on the contralateral side: Secondary | ICD-10-CM | POA: Insufficient documentation

## 2022-05-28 DIAGNOSIS — H6521 Chronic serous otitis media, right ear: Secondary | ICD-10-CM | POA: Diagnosis not present

## 2022-05-28 DIAGNOSIS — H6991 Unspecified Eustachian tube disorder, right ear: Secondary | ICD-10-CM | POA: Diagnosis not present

## 2022-05-28 DIAGNOSIS — H9071 Mixed conductive and sensorineural hearing loss, unilateral, right ear, with unrestricted hearing on the contralateral side: Secondary | ICD-10-CM | POA: Diagnosis not present

## 2022-05-30 ENCOUNTER — Ambulatory Visit: Payer: Medicare Other | Admitting: Internal Medicine

## 2022-06-02 ENCOUNTER — Other Ambulatory Visit: Payer: Self-pay | Admitting: Internal Medicine

## 2022-06-03 ENCOUNTER — Other Ambulatory Visit: Payer: Self-pay

## 2022-06-03 ENCOUNTER — Encounter: Payer: Self-pay | Admitting: Internal Medicine

## 2022-06-03 MED ORDER — LAMOTRIGINE 100 MG PO TABS: 100.0000 mg | ORAL_TABLET | Freq: Two times a day (BID) | ORAL | 11 refills | Status: DC

## 2022-06-17 DIAGNOSIS — Z5181 Encounter for therapeutic drug level monitoring: Secondary | ICD-10-CM | POA: Diagnosis not present

## 2022-06-17 DIAGNOSIS — R202 Paresthesia of skin: Secondary | ICD-10-CM | POA: Diagnosis not present

## 2022-06-17 DIAGNOSIS — M62830 Muscle spasm of back: Secondary | ICD-10-CM | POA: Diagnosis not present

## 2022-06-17 DIAGNOSIS — G894 Chronic pain syndrome: Secondary | ICD-10-CM | POA: Diagnosis not present

## 2022-06-17 DIAGNOSIS — Z79891 Long term (current) use of opiate analgesic: Secondary | ICD-10-CM | POA: Diagnosis not present

## 2022-06-17 DIAGNOSIS — M5136 Other intervertebral disc degeneration, lumbar region: Secondary | ICD-10-CM | POA: Diagnosis not present

## 2022-06-18 DIAGNOSIS — M5136 Other intervertebral disc degeneration, lumbar region: Secondary | ICD-10-CM | POA: Diagnosis not present

## 2022-06-20 ENCOUNTER — Ambulatory Visit: Payer: Self-pay | Admitting: Licensed Clinical Social Worker

## 2022-06-20 NOTE — Patient Outreach (Signed)
  Care Coordination  Initial Visit Note   06/20/2022 Name: [name] MRN: [id number] DOB: [birth year]  [name] is a 52 y.o. year old female who sees Plotnikov, Georgina Quint, MD for primary care. I spoke with  [name] by phone today.  What matters to the patients health and wellness today?    Patient reports no concerns or needs from Care Coordination team with health and wellness related to physical or mental heath. .    Goals Addressed             This Visit's Progress    COMPLETED: Care Coordination Activities        Care Coordination provides support specific to your health needs that extend beyond exceptional routine office care you already receive from your primary care doctor.   There is no cost to you for Care Coordination.  The Care Coordination team is made up of the following team members: Registered Nurse Care Manager: disease management, health education, care coordination and complex case management Clinical Social Work: Complex Care Coordination including coordination of level of care needs, mental and behavioral health assessment and recommendations, and connection to long-term mental health support Clinical Pharmacist: medication management, assistance and disease management Community Resource Care Guides: community resource connections  Please call [phone] if you would like to schedule a phone appointment with one of the team members.         SDOH assessments and interventions completed:  Yes  SDOH Interventions Today    Flowsheet Row Most Recent Value  SDOH Interventions   Food Insecurity Interventions Intervention Not Indicated  Housing Interventions Intervention Not Indicated  Transportation Interventions Intervention Not Indicated  Utilities Interventions Intervention Not Indicated  Financial Strain Interventions Intervention Not Indicated  Stress Interventions Intervention Not Indicated       Care Coordination Interventions:   Yes, provided  Interventions Today    Flowsheet Row Most Recent Value  Chronic Disease   Chronic disease during today's visit Hypertension (HTN)  General Interventions   General Interventions Discussed/Reviewed General Interventions Discussed  [Review of Care Coordination Program]  Mental Health Interventions   Mental Health Discussed/Reviewed Mental Health Discussed  Pharmacy Interventions   Pharmacy Dicussed/Reviewed Pharmacy Topics Discussed       Follow up plan: No further intervention required.   Encounter Outcome:  Pt. Visit Completed   Sammuel Hines, LCSW Social Work Care Coordination  [facility] [employer] [phone]

## 2022-06-20 NOTE — Patient Instructions (Signed)
Social Work Visit Information  Thank you for taking time to visit with me today. Please don't hesitate to contact me if I can be of assistance to you.    We discussed the following today:   Goals Addressed             This Visit's Progress    COMPLETED: Care Coordination Activities        Care Coordination provides support specific to your health needs that extend beyond exceptional routine office care you already receive from your primary care doctor.   There is no cost to you for Care Coordination.  The Care Coordination team is made up of the following team members: Registered Nurse Care Manager: disease management, health education, care coordination and complex case management Clinical Social Work: Complex Care Coordination including coordination of level of care needs, mental and behavioral health assessment and recommendations, and connection to long-term mental health support Clinical Pharmacist: medication management, assistance and disease management Community Resource Care Guides: community resource connections  Please call [phone] if you would like to schedule a phone appointment with one of the team members.        Patient does not desire continued follow-up by social work. They will contact the office if needed  Please call the care guide team at [phone] if you need to cancel or reschedule your appointment.   If you or anyone you know are experiencing a Mental Health or Behavioral Health Crisis or need someone to talk to, please call the Suicide and Crisis Lifeline: 988 call the [place] National Suicide Prevention Lifeline: (707)509-5672 or TTY: 605-669-0845 TTY 425-480-6089) to talk to a trained counselor call 1-800-273-TALK (toll free, 24 hour hotline) go to [facility] [address] [place] [phone])   Patient verbalizes understanding of instructions and care plan provided today and agrees to view in MyChart.  Active MyChart status and patient understanding of how to access instructions and care plan via MyChart confirmed with patient.      Sammuel Hines, LCSW Social Work Care Coordination  [facility] [employer] [phone]

## 2022-07-15 DIAGNOSIS — R202 Paresthesia of skin: Secondary | ICD-10-CM | POA: Diagnosis not present

## 2022-07-15 DIAGNOSIS — M5136 Other intervertebral disc degeneration, lumbar region: Secondary | ICD-10-CM | POA: Diagnosis not present

## 2022-08-04 ENCOUNTER — Other Ambulatory Visit: Payer: Self-pay | Admitting: Internal Medicine

## 2022-08-12 ENCOUNTER — Ambulatory Visit (INDEPENDENT_AMBULATORY_CARE_PROVIDER_SITE_OTHER): Payer: Medicare Other | Admitting: Internal Medicine

## 2022-08-12 ENCOUNTER — Encounter: Payer: Self-pay | Admitting: Internal Medicine

## 2022-08-12 VITALS — BP 120/70 | HR 98 | Temp 98.0°F | Ht 65.0 in | Wt 132.0 lb

## 2022-08-12 DIAGNOSIS — H5440 Blindness, one eye, unspecified eye: Secondary | ICD-10-CM

## 2022-08-12 DIAGNOSIS — R202 Paresthesia of skin: Secondary | ICD-10-CM

## 2022-08-12 DIAGNOSIS — M5136 Other intervertebral disc degeneration, lumbar region: Secondary | ICD-10-CM | POA: Diagnosis not present

## 2022-08-12 DIAGNOSIS — E559 Vitamin D deficiency, unspecified: Secondary | ICD-10-CM | POA: Diagnosis not present

## 2022-08-12 DIAGNOSIS — M25561 Pain in right knee: Secondary | ICD-10-CM | POA: Diagnosis not present

## 2022-08-12 DIAGNOSIS — R5383 Other fatigue: Secondary | ICD-10-CM | POA: Diagnosis not present

## 2022-08-12 DIAGNOSIS — G8929 Other chronic pain: Secondary | ICD-10-CM

## 2022-08-12 DIAGNOSIS — S0993XS Unspecified injury of face, sequela: Secondary | ICD-10-CM

## 2022-08-12 DIAGNOSIS — F17200 Nicotine dependence, unspecified, uncomplicated: Secondary | ICD-10-CM

## 2022-08-12 DIAGNOSIS — M25562 Pain in left knee: Secondary | ICD-10-CM

## 2022-08-12 DIAGNOSIS — M62830 Muscle spasm of back: Secondary | ICD-10-CM | POA: Diagnosis not present

## 2022-08-12 MED ORDER — LISDEXAMFETAMINE DIMESYLATE 50 MG PO CAPS: 50.0000 mg | ORAL_CAPSULE | Freq: Every day | ORAL | Status: DC
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
## 2022-08-12 NOTE — Patient Instructions (Addendum)
Blue-Emu cream use 2-3 times a day  TENS unit

## 2022-08-12 NOTE — Assessment & Plan Note (Signed)
 Cut back on smoking
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
## 2022-08-18 NOTE — Assessment & Plan Note (Signed)
She will start Lyrica today

## 2022-08-19 NOTE — Assessment & Plan Note (Signed)
Permanent vision loss in the right eye

## 2022-08-19 NOTE — Assessment & Plan Note (Signed)
Continue with vitamin D 

## 2022-09-03 ENCOUNTER — Ambulatory Visit (INDEPENDENT_AMBULATORY_CARE_PROVIDER_SITE_OTHER): Payer: Medicare Other | Admitting: Neurology

## 2022-09-03 ENCOUNTER — Encounter: Payer: Self-pay | Admitting: Neurology

## 2022-09-03 VITALS — BP 99/69 | HR 81 | Ht 65.0 in | Wt 134.8 lb

## 2022-09-03 DIAGNOSIS — R42 Dizziness and giddiness: Secondary | ICD-10-CM

## 2022-09-03 DIAGNOSIS — R519 Headache, unspecified: Secondary | ICD-10-CM

## 2022-09-03 DIAGNOSIS — G43709 Chronic migraine without aura, not intractable, without status migrainosus: Secondary | ICD-10-CM | POA: Diagnosis not present

## 2022-09-03 DIAGNOSIS — S069X4S Unspecified intracranial injury with loss of consciousness of 6 hours to 24 hours, sequela: Secondary | ICD-10-CM

## 2022-09-03 MED ORDER — PREGABALIN 25 MG PO CAPS: 25.0000 mg | ORAL_CAPSULE | Freq: Three times a day (TID) | ORAL | 3 refills | Status: DC

## 2022-09-03 MED ORDER — SUMATRIPTAN SUCCINATE 50 MG PO TABS: 50.0000 mg | ORAL_TABLET | ORAL | 6 refills | Status: DC | PRN

## 2022-09-03 MED ORDER — LAMOTRIGINE 100 MG PO TABS
100.0000 mg | ORAL_TABLET | Freq: Two times a day (BID) | ORAL | 11 refills | Status: DC
Start: 2022-09-03 — End: 2023-05-20
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
## 2022-09-04 DIAGNOSIS — H468 Other optic neuritis: Secondary | ICD-10-CM | POA: Diagnosis not present

## 2022-09-04 DIAGNOSIS — H5440 Blindness, one eye, unspecified eye: Secondary | ICD-10-CM | POA: Diagnosis not present

## 2022-09-04 DIAGNOSIS — H04123 Dry eye syndrome of bilateral lacrimal glands: Secondary | ICD-10-CM | POA: Diagnosis not present

## 2022-09-04 DIAGNOSIS — H2511 Age-related nuclear cataract, right eye: Secondary | ICD-10-CM | POA: Diagnosis not present

## 2022-09-09 DIAGNOSIS — R202 Paresthesia of skin: Secondary | ICD-10-CM | POA: Diagnosis not present

## 2022-09-09 DIAGNOSIS — M5136 Other intervertebral disc degeneration, lumbar region: Secondary | ICD-10-CM | POA: Diagnosis not present

## 2022-09-09 DIAGNOSIS — M62830 Muscle spasm of back: Secondary | ICD-10-CM | POA: Diagnosis not present

## 2022-09-16 DIAGNOSIS — M5136 Other intervertebral disc degeneration, lumbar region: Secondary | ICD-10-CM | POA: Diagnosis not present

## 2022-09-28 ENCOUNTER — Other Ambulatory Visit: Payer: Self-pay | Admitting: Internal Medicine

## 2022-10-01 ENCOUNTER — Other Ambulatory Visit (INDEPENDENT_AMBULATORY_CARE_PROVIDER_SITE_OTHER): Payer: Medicare Other

## 2022-10-01 ENCOUNTER — Encounter: Payer: Self-pay | Admitting: Neurology

## 2022-10-01 DIAGNOSIS — R5383 Other fatigue: Secondary | ICD-10-CM | POA: Diagnosis not present

## 2022-10-01 DIAGNOSIS — R202 Paresthesia of skin: Secondary | ICD-10-CM

## 2022-10-01 LAB — CBC WITH DIFFERENTIAL/PLATELET
Basophils Absolute: 0.1 10*3/uL (ref 0.0–0.1)
Basophils Relative: 1.1 % (ref 0.0–3.0)
Eosinophils Absolute: 0.1 10*3/uL (ref 0.0–0.7)
Eosinophils Relative: 3.1 % (ref 0.0–5.0)
HCT: 46.6 % — ABNORMAL HIGH (ref 36.0–46.0)
Hemoglobin: 15.8 g/dL — ABNORMAL HIGH (ref 12.0–15.0)
Lymphocytes Relative: 37 % (ref 12.0–46.0)
Lymphs Abs: 1.7 10*3/uL (ref 0.7–4.0)
MCHC: 34 g/dL (ref 30.0–36.0)
MCV: 94.9 fl (ref 78.0–100.0)
Monocytes Absolute: 0.4 10*3/uL (ref 0.1–1.0)
Monocytes Relative: 9.7 % (ref 3.0–12.0)
Neutro Abs: 2.3 10*3/uL (ref 1.4–7.7)
Neutrophils Relative %: 49.1 % (ref 43.0–77.0)
Platelets: 167 10*3/uL (ref 150.0–400.0)
RBC: 4.9 Mil/uL (ref 3.87–5.11)
RDW: 11.9 % (ref 11.5–15.5)
WBC: 4.6 10*3/uL (ref 4.0–10.5)

## 2022-10-01 LAB — COMPREHENSIVE METABOLIC PANEL
ALT: 12 U/L (ref 0–35)
AST: 14 U/L (ref 0–37)
Albumin: 4.6 g/dL (ref 3.5–5.2)
Alkaline Phosphatase: 95 U/L (ref 39–117)
BUN: 12 mg/dL (ref 6–23)
CO2: 27 meq/L (ref 19–32)
Calcium: 9.8 mg/dL (ref 8.4–10.5)
Chloride: 101 meq/L (ref 96–112)
Creatinine, Ser: 0.74 mg/dL (ref 0.40–1.20)
GFR: 94.72 mL/min (ref >60.00)
Glucose, Bld: 78 mg/dL (ref 70–99)
Potassium: 4.8 meq/L (ref 3.5–5.1)
Sodium: 138 meq/L (ref 135–145)
Total Bilirubin: 0.5 mg/dL (ref 0.2–1.2)
Total Protein: 7.5 g/dL (ref 6.0–8.3)

## 2022-10-02 ENCOUNTER — Encounter: Payer: Self-pay | Admitting: Internal Medicine

## 2022-10-02 ENCOUNTER — Telehealth: Payer: Medicare Other | Admitting: Physician Assistant

## 2022-10-02 DIAGNOSIS — R3989 Other symptoms and signs involving the genitourinary system: Secondary | ICD-10-CM | POA: Diagnosis not present

## 2022-10-02 LAB — IRON,TIBC AND FERRITIN PANEL
%SAT: 34 % (ref 16–45)
Ferritin: 82 ng/mL (ref 16–232)
Iron: 113 ug/dL (ref 40–190)
TIBC: 333 ug/dL (ref 250–450)

## 2022-10-02 LAB — TSH: TSH: 2.89 u[IU]/mL (ref 0.35–5.50)

## 2022-10-02 LAB — VITAMIN B12: Vitamin B-12: 546 pg/mL (ref 211–911)

## 2022-10-02 MED ORDER — NITROFURANTOIN MONOHYD MACRO 100 MG PO CAPS
100.0000 mg | ORAL_CAPSULE | Freq: Two times a day (BID) | ORAL | 0 refills | Status: DC
Start: 2022-10-02 — End: 2023-11-21

## 2022-10-02 NOTE — Progress Notes (Signed)
E-Visit for Urinary Problems ? ?We are sorry that you are not feeling well.  Here is how we plan to help! ? ?Based on what you shared with me it looks like you most likely have a simple urinary tract infection. ? ?A UTI (Urinary Tract Infection) is a bacterial infection of the bladder. ? ?Most cases of urinary tract infections are simple to treat but a key part of your care is to encourage you to drink plenty of fluids and watch your symptoms carefully. ? ?I have prescribed MacroBid 100 mg twice a day for 5 days.  Your symptoms should gradually improve. Call us if the burning in your urine worsens, you develop worsening fever, back pain or pelvic pain or if your symptoms do not resolve after completing the antibiotic. ? ?Urinary tract infections can be prevented by drinking plenty of water to keep your body hydrated.  Also be sure when you wipe, wipe from front to back and don't hold it in!  If possible, empty your bladder every 4 hours. ? ?HOME CARE ?Drink plenty of fluids ?Compete the full course of the antibiotics even if the symptoms resolve ?Remember, when you need to go?go. Holding in your urine can increase the likelihood of getting a UTI! ?GET HELP RIGHT AWAY IF: ?You cannot urinate ?You get a high fever ?Worsening back pain occurs ?You see blood in your urine ?You feel sick to your stomach or throw up ?You feel like you are going to pass out ? ?MAKE SURE YOU  ?Understand these instructions. ?Will watch your condition. ?Will get help right away if you are not doing well or get worse. ? ? ?Thank you for choosing an e-visit. ? ?Your e-visit answers were reviewed by a board certified advanced clinical practitioner to complete your personal care plan. Depending upon the condition, your plan could have included both over the counter or prescription medications. ? ?Please review your pharmacy choice. Make sure the pharmacy is open so you can pick up prescription now. If there is a problem, you may contact your  provider through MyChart messaging and have the prescription routed to another pharmacy.  Your safety is important to us. If you have drug allergies check your prescription carefully.  ? ?For the next 24 hours you can use MyChart to ask questions about today's visit, request a non-urgent call back, or ask for a work or school excuse. ?You will get an email in the next two days asking about your experience. I hope that your e-visit has been valuable and will speed your recovery.  ?

## 2022-10-02 NOTE — Progress Notes (Signed)
I have spent 5 minutes in review of e-visit questionnaire, review and updating patient chart, medical decision making and response to patient.   William Cody Martin, PA-C    

## 2022-10-03 ENCOUNTER — Other Ambulatory Visit: Payer: Self-pay | Admitting: Internal Medicine
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
## 2022-10-07 DIAGNOSIS — G894 Chronic pain syndrome: Secondary | ICD-10-CM | POA: Diagnosis not present

## 2022-10-07 DIAGNOSIS — M5136 Other intervertebral disc degeneration, lumbar region: Secondary | ICD-10-CM | POA: Diagnosis not present

## 2022-10-07 DIAGNOSIS — Z79891 Long term (current) use of opiate analgesic: Secondary | ICD-10-CM | POA: Diagnosis not present

## 2022-10-07 DIAGNOSIS — R202 Paresthesia of skin: Secondary | ICD-10-CM | POA: Diagnosis not present

## 2022-10-16 DIAGNOSIS — M5136 Other intervertebral disc degeneration, lumbar region: Secondary | ICD-10-CM | POA: Diagnosis not present

## 2022-11-11 DIAGNOSIS — M47897 Other spondylosis, lumbosacral region: Secondary | ICD-10-CM | POA: Diagnosis not present

## 2022-11-13 ENCOUNTER — Other Ambulatory Visit: Payer: Self-pay | Admitting: Internal Medicine

## 2022-11-15 DIAGNOSIS — G894 Chronic pain syndrome: Secondary | ICD-10-CM | POA: Diagnosis not present

## 2022-12-10 ENCOUNTER — Telehealth: Payer: Medicare Other | Admitting: Physician Assistant

## 2022-12-10 DIAGNOSIS — R3989 Other symptoms and signs involving the genitourinary system: Secondary | ICD-10-CM | POA: Diagnosis not present

## 2022-12-10 MED ORDER — CEPHALEXIN 500 MG PO CAPS: 500.0000 mg | ORAL_CAPSULE | Freq: Two times a day (BID) | ORAL | 0 refills | Status: AC

## 2022-12-10 NOTE — Progress Notes (Signed)

## 2022-12-15 ENCOUNTER — Other Ambulatory Visit: Payer: Self-pay | Admitting: Internal Medicine

## 2022-12-16 DIAGNOSIS — M47897 Other spondylosis, lumbosacral region: Secondary | ICD-10-CM | POA: Diagnosis not present

## 2022-12-21 NOTE — Progress Notes (Signed)
I have spent 5 minutes in review of e-visit questionnaire, review and updating patient chart, medical decision making and response to patient.   Laure Kidney, PA-C

## 2022-12-27 ENCOUNTER — Other Ambulatory Visit: Payer: Self-pay | Admitting: Internal Medicine

## 2023-01-13 DIAGNOSIS — Z79891 Long term (current) use of opiate analgesic: Secondary | ICD-10-CM | POA: Diagnosis not present

## 2023-01-13 DIAGNOSIS — G894 Chronic pain syndrome: Secondary | ICD-10-CM | POA: Diagnosis not present

## 2023-02-12 ENCOUNTER — Other Ambulatory Visit: Payer: Self-pay | Admitting: Cardiovascular Disease

## 2023-02-17 DIAGNOSIS — R202 Paresthesia of skin: Secondary | ICD-10-CM | POA: Diagnosis not present

## 2023-02-17 DIAGNOSIS — M170 Bilateral primary osteoarthritis of knee: Secondary | ICD-10-CM | POA: Diagnosis not present

## 2023-02-17 DIAGNOSIS — M1711 Unilateral primary osteoarthritis, right knee: Secondary | ICD-10-CM | POA: Diagnosis not present

## 2023-02-17 DIAGNOSIS — G894 Chronic pain syndrome: Secondary | ICD-10-CM | POA: Diagnosis not present

## 2023-02-17 DIAGNOSIS — M1712 Unilateral primary osteoarthritis, left knee: Secondary | ICD-10-CM | POA: Diagnosis not present

## 2023-02-17 DIAGNOSIS — Z79891 Long term (current) use of opiate analgesic: Secondary | ICD-10-CM | POA: Diagnosis not present

## 2023-02-18 ENCOUNTER — Other Ambulatory Visit: Payer: Self-pay | Admitting: Internal Medicine

## 2023-02-18 DIAGNOSIS — Z1231 Encounter for screening mammogram for malignant neoplasm of breast: Secondary | ICD-10-CM

## 2023-02-27 ENCOUNTER — Encounter: Payer: Self-pay | Admitting: Internal Medicine

## 2023-02-27 ENCOUNTER — Telehealth: Payer: Medicare Other | Admitting: Internal Medicine

## 2023-02-27 DIAGNOSIS — H5440 Blindness, one eye, unspecified eye: Secondary | ICD-10-CM | POA: Diagnosis not present

## 2023-02-27 DIAGNOSIS — H04122 Dry eye syndrome of left lacrimal gland: Secondary | ICD-10-CM | POA: Diagnosis not present

## 2023-02-27 DIAGNOSIS — R059 Cough, unspecified: Secondary | ICD-10-CM | POA: Diagnosis not present

## 2023-02-27 DIAGNOSIS — R062 Wheezing: Secondary | ICD-10-CM | POA: Diagnosis not present

## 2023-02-27 DIAGNOSIS — Z79891 Long term (current) use of opiate analgesic: Secondary | ICD-10-CM | POA: Insufficient documentation

## 2023-02-27 MED ORDER — CYCLOSPORINE 0.05 % OP EMUL
1.0000 [drp] | Freq: Two times a day (BID) | OPHTHALMIC | 3 refills | Status: DC
Start: 2023-02-27 — End: 2023-04-07

## 2023-02-27 MED ORDER — ERYTHROMYCIN 5 MG/GM OP OINT: 1.0000 | TOPICAL_OINTMENT | Freq: Three times a day (TID) | OPHTHALMIC | 0 refills | Status: DC

## 2023-02-27 MED ORDER — FLUTICASONE PROPIONATE 50 MCG/ACT NA SUSP: 2.0000 | Freq: Every day | NASAL | 3 refills | Status: AC

## 2023-02-27 MED ORDER — ALBUTEROL SULFATE HFA 108 (90 BASE) MCG/ACT IN AERS: 2.0000 | INHALATION_SPRAY | Freq: Four times a day (QID) | RESPIRATORY_TRACT | 0 refills | Status: DC | PRN

## 2023-02-27 NOTE — Progress Notes (Signed)
Virtual Visit via Video Note  I connected with [name] on 02/27/23 at  8:50 AM EST by a video enabled telemedicine application and verified that I am speaking with the correct person using two identifiers.   I discussed the limitations of evaluation and management by telemedicine and the availability of in person appointments. The patient expressed understanding and agreed to proceed.  I was located at our [facility] office. The patient was at home. There was no one else present in the visit.  Chief Complaint  Patient presents with   Eye Problem    Notes of itchy, dry, throbbing eye (right). Notes this has been occurring since their accident. Patient's spouse states when she sleeps she is not completely closing the right eye Currently treating with eye drops and allergy medication.      History of Present Illness: [name] is complaining of R eye dryness, irritation.  It is her injured blind eye.  She is not able to close it completely at night. Follow-up on asthma, Allergies  Review of Systems  Constitutional:  Positive for malaise/fatigue. Negative for fever.  Eyes:  Positive for photophobia and pain. Negative for blurred vision, discharge and redness.  Respiratory:  Positive for cough.      Observations/Objective: The patient appears to be in no acute distress.  Right eye appearance is unchanged from before.  Assessment and Plan:  Problem List Items Addressed This Visit     Wheezing   Mild asthma.  Prescribed Flonase, albuterol MDI to use as needed      Blind right eye   Prescribed erythromycin ointment to 3 times a day for dryness Restasis drops twice a day for dryness      Dry eye of left side - Primary   Other Visit Diagnoses       Cough       Relevant Medications   albuterol (VENTOLIN HFA) 108 (90 Base) MCG/ACT inhaler   fluticasone (FLONASE) 50 MCG/ACT nasal spray        Meds ordered this encounter  Medications   albuterol (VENTOLIN HFA) 108  (90 Base) MCG/ACT inhaler    Sig: Inhale 2 puffs into the lungs every 6 (six) hours as needed for wheezing or shortness of breath.    Dispense:  8 g    Refill:  0   fluticasone (FLONASE) 50 MCG/ACT nasal spray    Sig: Place 2 sprays into both nostrils daily.    Dispense:  48 g    Refill:  3   cycloSPORINE (RESTASIS) 0.05 % ophthalmic emulsion    Sig: Place 1 drop into the right eye 2 (two) times daily.    Dispense:  0.4 mL    Refill:  3   erythromycin ophthalmic ointment    Sig: Place 1 Application into the right eye 3 (three) times daily. In the R eye    Dispense:  3.5 g    Refill:  0     Follow Up Instructions:    I discussed the assessment and treatment plan with the patient. The patient was provided an opportunity to ask questions and all were answered. The patient agreed with the plan and demonstrated an understanding of the instructions.   The patient was advised to call back or seek an in-person evaluation if the symptoms worsen or if the condition fails to improve as anticipated.  I provided face-to-face time during this encounter. We were at different locations.   Sonda Primes, MD

## 2023-02-27 NOTE — Assessment & Plan Note (Signed)
Mild asthma.  Prescribed Flonase, albuterol MDI to use as needed

## 2023-02-27 NOTE — Assessment & Plan Note (Signed)
Prescribed erythromycin ointment to 3 times a day for dryness Restasis drops twice a day for dryness

## 2023-03-06 ENCOUNTER — Other Ambulatory Visit: Payer: Self-pay | Admitting: Internal Medicine

## 2023-03-08 ENCOUNTER — Other Ambulatory Visit: Payer: Self-pay | Admitting: Internal Medicine

## 2023-03-11 ENCOUNTER — Ambulatory Visit: Payer: Medicare Other | Admitting: Internal Medicine

## 2023-03-13 ENCOUNTER — Other Ambulatory Visit: Payer: Self-pay

## 2023-03-13 ENCOUNTER — Encounter: Payer: Self-pay | Admitting: Internal Medicine

## 2023-03-13 DIAGNOSIS — H04123 Dry eye syndrome of bilateral lacrimal glands: Secondary | ICD-10-CM | POA: Diagnosis not present

## 2023-03-13 DIAGNOSIS — H02201 Unspecified lagophthalmos right upper eyelid: Secondary | ICD-10-CM | POA: Diagnosis not present

## 2023-03-13 DIAGNOSIS — H5440 Blindness, one eye, unspecified eye: Secondary | ICD-10-CM | POA: Diagnosis not present

## 2023-03-13 DIAGNOSIS — H468 Other optic neuritis: Secondary | ICD-10-CM | POA: Diagnosis not present

## 2023-03-13 MED ORDER — B COMPLEX (FOLIC ACID) PO TABS: ORAL_TABLET | ORAL | 3 refills | Status: DC

## 2023-03-17 ENCOUNTER — Ambulatory Visit
Admission: RE | Admit: 2023-03-17 | Discharge: 2023-03-17 | Disposition: A | Discharge status: Home / Self Care | Payer: Medicare Other | Source: Ambulatory Visit

## 2023-03-17 DIAGNOSIS — E785 Hyperlipidemia, unspecified: Secondary | ICD-10-CM

## 2023-03-17 DIAGNOSIS — Z79891 Long term (current) use of opiate analgesic: Secondary | ICD-10-CM | POA: Diagnosis not present

## 2023-03-17 DIAGNOSIS — Z1231 Encounter for screening mammogram for malignant neoplasm of breast: Secondary | ICD-10-CM

## 2023-03-17 DIAGNOSIS — G894 Chronic pain syndrome: Secondary | ICD-10-CM | POA: Diagnosis not present

## 2023-03-17 DIAGNOSIS — R202 Paresthesia of skin: Secondary | ICD-10-CM | POA: Diagnosis not present

## 2023-03-18 NOTE — Telephone Encounter (Signed)
Called pt, set up with Dr. Royann Shivers 2/21. Pt will have labs drawn tomorrow.

## 2023-03-21 ENCOUNTER — Encounter: Payer: Self-pay | Admitting: Cardiovascular Disease

## 2023-03-21 ENCOUNTER — Ambulatory Visit: Payer: Medicare Other | Attending: Cardiovascular Disease | Admitting: Cardiovascular Disease

## 2023-03-21 ENCOUNTER — Other Ambulatory Visit: Payer: Self-pay | Admitting: Internal Medicine

## 2023-03-21 ENCOUNTER — Other Ambulatory Visit (INDEPENDENT_AMBULATORY_CARE_PROVIDER_SITE_OTHER): Payer: Medicare Other

## 2023-03-21 VITALS — BP 108/80 | HR 65 | Ht 65.0 in | Wt 130.6 lb

## 2023-03-21 DIAGNOSIS — F17200 Nicotine dependence, unspecified, uncomplicated: Secondary | ICD-10-CM | POA: Diagnosis not present

## 2023-03-21 DIAGNOSIS — E785 Hyperlipidemia, unspecified: Secondary | ICD-10-CM

## 2023-03-21 DIAGNOSIS — R739 Hyperglycemia, unspecified: Secondary | ICD-10-CM | POA: Diagnosis not present

## 2023-03-21 DIAGNOSIS — I2510 Atherosclerotic heart disease of native coronary artery without angina pectoris: Secondary | ICD-10-CM | POA: Diagnosis not present

## 2023-03-21 DIAGNOSIS — I10 Essential (primary) hypertension: Secondary | ICD-10-CM | POA: Diagnosis not present

## 2023-03-21 DIAGNOSIS — D751 Secondary polycythemia: Secondary | ICD-10-CM

## 2023-03-21 LAB — CBC WITH DIFFERENTIAL/PLATELET
Basophils Absolute: 0 10*3/uL (ref 0.0–0.1)
Basophils Relative: 1 % (ref 0.0–3.0)
Eosinophils Absolute: 0.1 10*3/uL (ref 0.0–0.7)
Eosinophils Relative: 1.8 % (ref 0.0–5.0)
HCT: 43.8 % (ref 36.0–46.0)
Hemoglobin: 15.2 g/dL — ABNORMAL HIGH (ref 12.0–15.0)
Lymphocytes Relative: 39 % (ref 12.0–46.0)
Lymphs Abs: 1.8 10*3/uL (ref 0.7–4.0)
MCHC: 34.6 g/dL (ref 30.0–36.0)
MCV: 94.4 fL (ref 78.0–100.0)
Monocytes Absolute: 0.5 10*3/uL (ref 0.1–1.0)
Monocytes Relative: 11.4 % (ref 3.0–12.0)
Neutro Abs: 2.2 10*3/uL (ref 1.4–7.7)
Neutrophils Relative %: 46.8 % (ref 43.0–77.0)
Platelets: 198 10*3/uL (ref 150.0–400.0)
RBC: 4.64 Mil/uL (ref 3.87–5.11)
RDW: 12.5 % (ref 11.5–15.5)
WBC: 4.7 10*3/uL (ref 4.0–10.5)

## 2023-03-21 LAB — BASIC METABOLIC PANEL
BUN: 12 mg/dL (ref 6–23)
CO2: 28 meq/L (ref 19–32)
Calcium: 9 mg/dL (ref 8.4–10.5)
Chloride: 104 meq/L (ref 96–112)
Creatinine, Ser: 0.63 mg/dL (ref 0.40–1.20)
GFR: 103.51 mL/min (ref >60.00)
Glucose, Bld: 97 mg/dL (ref 70–99)
Potassium: 4.6 meq/L (ref 3.5–5.1)
Sodium: 139 meq/L (ref 135–145)

## 2023-03-21 LAB — HEMOGLOBIN A1C: Hgb A1c MFr Bld: 4.8 % (ref 4.6–6.5)
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
## 2023-03-21 NOTE — Patient Instructions (Signed)
Medication Instructions:  NO CHANGES  *If you need a refill on your cardiac medications before your next appointment, please call your pharmacy*   Follow-Up: At [facility], you and your health needs are our priority.  As part of our continuing mission to provide you with exceptional heart care, we have created designated Provider Care Teams.  These Care Teams include your primary Cardiologist (physician) and Advanced Practice Providers (APPs -  Physician Assistants and Nurse Practitioners) who all work together to provide you with the care you need, when you need it.  We recommend signing up for the patient portal called "MyChart".  Sign up information is provided on this After Visit Summary.  MyChart is used to connect with patients for Virtual Visits (Telemedicine).  Patients are able to view lab/test results, encounter notes, upcoming appointments, etc.  Non-urgent messages can be sent to your provider as well.   To learn more about what you can do with MyChart, go to [email or web address].    Your next appointment:    12 months with Dr. Croitoru    

## 2023-03-24 ENCOUNTER — Encounter: Payer: Self-pay | Admitting: Internal Medicine

## 2023-03-25 ENCOUNTER — Other Ambulatory Visit: Payer: Self-pay | Admitting: Internal Medicine

## 2023-04-02 ENCOUNTER — Encounter: Payer: Self-pay | Admitting: Internal Medicine

## 2023-04-07 ENCOUNTER — Other Ambulatory Visit: Payer: Self-pay

## 2023-04-07 DIAGNOSIS — R059 Cough, unspecified: Secondary | ICD-10-CM

## 2023-04-07 MED ORDER — ALBUTEROL SULFATE HFA 108 (90 BASE) MCG/ACT IN AERS: 2.0000 | INHALATION_SPRAY | Freq: Four times a day (QID) | RESPIRATORY_TRACT | 0 refills | Status: DC | PRN

## 2023-04-07 MED ORDER — CYCLOSPORINE 0.05 % OP EMUL: 1.0000 [drp] | Freq: Two times a day (BID) | OPHTHALMIC | 3 refills | Status: DC

## 2023-04-12 ENCOUNTER — Encounter: Payer: Self-pay | Admitting: Neurology

## 2023-04-13 ENCOUNTER — Telehealth: Admitting: Physician Assistant

## 2023-04-13 DIAGNOSIS — J069 Acute upper respiratory infection, unspecified: Secondary | ICD-10-CM | POA: Diagnosis not present

## 2023-04-13 MED ORDER — AZELASTINE HCL 0.1 % NA SOLN: 2.0000 | Freq: Two times a day (BID) | NASAL | 12 refills | Status: AC

## 2023-04-13 MED ORDER — BENZONATATE 100 MG PO CAPS: 100.0000 mg | ORAL_CAPSULE | Freq: Two times a day (BID) | ORAL | 0 refills | Status: AC | PRN

## 2023-04-13 NOTE — Progress Notes (Signed)

## 2023-04-13 NOTE — Progress Notes (Signed)
 I have spent 5 minutes in review of e-visit questionnaire, review and updating patient chart, medical decision making and response to patient.   Laure Kidney, PA-C

## 2023-04-14 DIAGNOSIS — G894 Chronic pain syndrome: Secondary | ICD-10-CM | POA: Diagnosis not present

## 2023-04-14 DIAGNOSIS — Z79891 Long term (current) use of opiate analgesic: Secondary | ICD-10-CM | POA: Diagnosis not present

## 2023-04-14 DIAGNOSIS — R202 Paresthesia of skin: Secondary | ICD-10-CM | POA: Diagnosis not present

## 2023-04-14 MED ORDER — PREGABALIN 25 MG PO CAPS: 25.0000 mg | ORAL_CAPSULE | Freq: Three times a day (TID) | ORAL | 3 refills | Status: DC

## 2023-04-15 ENCOUNTER — Ambulatory Visit
Admission: RE | Admit: 2023-04-15 | Discharge: 2023-04-15 | Disposition: A | Discharge status: Home / Self Care | Source: Ambulatory Visit | Attending: Family Medicine | Admitting: Family Medicine

## 2023-04-15 ENCOUNTER — Other Ambulatory Visit: Payer: Self-pay

## 2023-04-15 VITALS — BP 112/73 | HR 104 | Temp 98.8°F | Resp 17 | Ht 65.5 in | Wt 133.0 lb

## 2023-04-15 DIAGNOSIS — J019 Acute sinusitis, unspecified: Secondary | ICD-10-CM | POA: Diagnosis not present

## 2023-04-15 MED ORDER — CEFDINIR 300 MG PO CAPS: 300.0000 mg | ORAL_CAPSULE | Freq: Two times a day (BID) | ORAL | 0 refills | Status: AC
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
## 2023-04-15 NOTE — ED Triage Notes (Addendum)
 Pt had fever that began last Tuesday. Today she presents for sinus congestion and chest discomfort.

## 2023-04-17 ENCOUNTER — Encounter: Payer: Self-pay | Admitting: Internal Medicine

## 2023-04-21 ENCOUNTER — Other Ambulatory Visit: Payer: Self-pay | Admitting: Internal Medicine

## 2023-04-21 MED ORDER — FLUCONAZOLE 150 MG PO TABS: 150.0000 mg | ORAL_TABLET | Freq: Once | ORAL | 1 refills | Status: AC

## 2023-04-22 ENCOUNTER — Other Ambulatory Visit: Payer: Self-pay | Admitting: Internal Medicine

## 2023-04-25 ENCOUNTER — Other Ambulatory Visit: Payer: Self-pay | Admitting: Cardiovascular Disease

## 2023-04-27 ENCOUNTER — Other Ambulatory Visit: Payer: Self-pay | Admitting: Internal Medicine

## 2023-05-16 ENCOUNTER — Ambulatory Visit
Admission: RE | Admit: 2023-05-16 | Discharge: 2023-05-16 | Disposition: A | Discharge status: Home / Self Care | Source: Ambulatory Visit | Attending: Internal Medicine | Admitting: Internal Medicine

## 2023-05-16 ENCOUNTER — Other Ambulatory Visit: Payer: Self-pay

## 2023-05-16 VITALS — BP 119/75 | HR 88 | Temp 97.4°F | Resp 18 | Ht 65.5 in | Wt 133.0 lb

## 2023-05-16 DIAGNOSIS — L02411 Cutaneous abscess of right axilla: Secondary | ICD-10-CM | POA: Diagnosis not present

## 2023-05-16 DIAGNOSIS — L731 Pseudofolliculitis barbae: Secondary | ICD-10-CM | POA: Diagnosis not present

## 2023-05-16 NOTE — ED Triage Notes (Addendum)
 Pt presents with complaints of abscess under right axilla. Pt noticed this abscess yesterday. Pt states she was prescribed doxycycline on her last abscess, she is allergic to this now.   Pt also reports concerns of throat burning after putting eye drops in right eye last night. This is apart of her normal routine. Never had this reaction before.   Pt currently denies pain.
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
## 2023-05-16 NOTE — Discharge Instructions (Addendum)
 You were seen today for concerns for an abscess in your right underarm area.  At this time this appears to be small and does not likely need antibiotic therapy or incision and drainage today.  I recommend using warm compresses to the area and cleansing gently with warm water and a mild soap.  These areas typically resolve on their own with mild intervention as noted above.  You can take Tylenol  and ibuprofen  as needed for pain management. Your throat did not show any signs of redness, irritation or swelling.  At this time if you continue to have symptoms I recommend using warm tea with honey, throat lozenges, warm salt water gargles as desired.  If you notice that the area under your arm starts to become more swollen, more painful, has a lot of drainage that looks like pus, increased warmth to the area, swelling and bright redness this could be signs of the infection.  I would recommend returning to urgent care for further evaluation if needed.

## 2023-05-19 ENCOUNTER — Other Ambulatory Visit: Payer: Self-pay | Admitting: Neurology

## 2023-05-20 NOTE — Telephone Encounter (Signed)
 Last note stated:   She tolerated well lamotrigine  100 mg twice daily, seems to help her dizzy spells, but she wants to simplify medication list, I advised her may give her a trial of stopping lamotrigine , if she has recurrent dizziness spells, she should go back on it   Dr. Gracie Lav,  Did you want her to stop or refill? Thanks,  [employer]

## 2023-05-29 ENCOUNTER — Other Ambulatory Visit (HOSPITAL_COMMUNITY): Payer: Self-pay | Admitting: Dermatology

## 2023-05-29 DIAGNOSIS — H5440 Blindness, one eye, unspecified eye: Secondary | ICD-10-CM | POA: Diagnosis not present

## 2023-05-29 DIAGNOSIS — S0285XA Fracture of orbit, unspecified, initial encounter for closed fracture: Secondary | ICD-10-CM

## 2023-05-29 DIAGNOSIS — R569 Unspecified convulsions: Secondary | ICD-10-CM | POA: Insufficient documentation

## 2023-05-29 DIAGNOSIS — H468 Other optic neuritis: Secondary | ICD-10-CM | POA: Diagnosis not present

## 2023-05-29 DIAGNOSIS — Z6820 Body mass index (BMI) 20.0-20.9, adult: Secondary | ICD-10-CM | POA: Diagnosis not present

## 2023-05-31 ENCOUNTER — Telehealth: Admitting: Nurse Practitioner

## 2023-05-31 DIAGNOSIS — L0292 Furuncle, unspecified: Secondary | ICD-10-CM | POA: Diagnosis not present

## 2023-05-31 MED ORDER — SULFAMETHOXAZOLE-TRIMETHOPRIM 800-160 MG PO TABS: 1.0000 | ORAL_TABLET | Freq: Two times a day (BID) | ORAL | 0 refills | Status: AC

## 2023-05-31 NOTE — Progress Notes (Signed)
 I have spent 5 minutes in review of e-visit questionnaire, review and updating patient chart, medical decision making and response to patient.   Claiborne Rigg, NP

## 2023-05-31 NOTE — Progress Notes (Signed)
 E-Visit for Boil  We are sorry that you are not feeling well. Here is how we plan to help!   I have prescribed:  Bactrim  DS 1 tablet by mouth twice a day for 7 days  HOME CARE:  Take your medications as ordered and take all of them, even if the skin irritation appears to be healing.   GET HELP RIGHT AWAY IF:  Symptoms that don't begin to go away within 48 hours. Severe redness persists or worsens If the area turns color, spreads or swells. If it blisters and opens, develops yellow-brown crust or bleeds. You develop a fever or chills. If the pain increases or becomes unbearable.  Are unable to keep fluids and food down.  MAKE SURE YOU   Understand these instructions. Will watch your condition. Will get help right away if you are not doing well or get worse.  Thank you for choosing an e-visit.  Your e-visit answers were reviewed by a board certified advanced clinical practitioner to complete your personal care plan. Depending upon the condition, your plan could have included both over the counter or prescription medications.  Please review your pharmacy choice. Make sure the pharmacy is open so you can pick up prescription now. If there is a problem, you may contact your provider through [employer] and have the prescription routed to another pharmacy.  Your safety is important to us . If you have drug allergies check your prescription carefully.   For the next 24 hours you can use MyChart to ask questions about today's visit, request a non-urgent call back, or ask for a work or school excuse. You will get an email in the next two days asking about your experience. I hope that your e-visit has been valuable and will speed your recovery.

## 2023-06-05 ENCOUNTER — Encounter: Payer: Self-pay | Admitting: Internal Medicine

## 2023-06-05 ENCOUNTER — Ambulatory Visit (HOSPITAL_COMMUNITY)
Admission: RE | Admit: 2023-06-05 | Discharge: 2023-06-05 | Disposition: A | Discharge status: Home / Self Care | Source: Ambulatory Visit | Attending: Dermatology | Admitting: Dermatology

## 2023-06-05 DIAGNOSIS — S0285XA Fracture of orbit, unspecified, initial encounter for closed fracture: Secondary | ICD-10-CM | POA: Diagnosis not present

## 2023-06-05 DIAGNOSIS — S0292XD Unspecified fracture of facial bones, subsequent encounter for fracture with routine healing: Secondary | ICD-10-CM | POA: Diagnosis not present

## 2023-06-06 ENCOUNTER — Other Ambulatory Visit: Payer: Self-pay

## 2023-06-06 ENCOUNTER — Ambulatory Visit: Payer: Self-pay

## 2023-06-06 ENCOUNTER — Emergency Department (HOSPITAL_COMMUNITY)
Admission: EM | Admit: 2023-06-06 | Discharge: 2023-06-06 | Disposition: A | Discharge status: Home / Self Care | Attending: Student | Admitting: Student

## 2023-06-06 ENCOUNTER — Other Ambulatory Visit (HOSPITAL_COMMUNITY): Payer: Self-pay

## 2023-06-06 DIAGNOSIS — Z23 Encounter for immunization: Secondary | ICD-10-CM | POA: Insufficient documentation

## 2023-06-06 DIAGNOSIS — L02413 Cutaneous abscess of right upper limb: Secondary | ICD-10-CM | POA: Diagnosis not present

## 2023-06-06 DIAGNOSIS — Z7982 Long term (current) use of aspirin: Secondary | ICD-10-CM | POA: Insufficient documentation

## 2023-06-06 DIAGNOSIS — L02411 Cutaneous abscess of right axilla: Secondary | ICD-10-CM | POA: Insufficient documentation

## 2023-06-06 DIAGNOSIS — L0291 Cutaneous abscess, unspecified: Secondary | ICD-10-CM

## 2023-06-06 MED ORDER — DEXTROSE 5 % IV SOLN
1500.0000 mg | Freq: Once | INTRAVENOUS | Status: AC
  Administered 2023-06-06: 1500 mg via INTRAVENOUS
  Filled 2023-06-06: qty 75

## 2023-06-06 MED ORDER — TETANUS-DIPHTH-ACELL PERTUSSIS 5-2.5-18.5 LF-MCG/0.5 IM SUSY
0.5000 mL | PREFILLED_SYRINGE | Freq: Once | INTRAMUSCULAR | Status: AC
  Administered 2023-06-06: 0.5 mL via INTRAMUSCULAR
  Filled 2023-06-06: qty 0.5

## 2023-06-06 MED ORDER — LIDOCAINE HCL (PF) 1 % IJ SOLN
20.0000 mL | Freq: Once | INTRAMUSCULAR | Status: AC
  Administered 2023-06-06: 20 mL
  Filled 2023-06-06: qty 30

## 2023-06-06 NOTE — ED Triage Notes (Signed)
 Pt c/o 2 abscesses in R axilla. Been taking Bactrim  since Saturday and has had no improvement.

## 2023-06-06 NOTE — Discharge Instructions (Addendum)
 Evaluation today revealed you had multiple abscesses about your right armpit.  You were treated with Dalvance which is a one-time antibiotic treatment.  Please with infectious disease.  If you develop a fever, you have considerable amount of pustulant discharge, pain and redness worsens at the sites or any other concerning symptom please return to the ED for further evaluation.

## 2023-06-06 NOTE — Telephone Encounter (Signed)
  Chief Complaint: abscess not improved on antibiotics Symptoms: fever, right armpit abscess with warmth, pus drainage and swelling, redness Frequency: x 3 weeks Pertinent Negatives: Patient denies chills, body aches, spreading redness or streaks Disposition: [x] ED /[] Urgent Care (no appt availability in office) / [] Appointment(In office/virtual)/ []  [facility]/ [] Home Care/ [] Refused Recommended Disposition /[] [facility]/ []  Follow-up with PCP Additional Notes: Patient states she has a history of MRSA. Patient seen in mid April with [place] [facility] urgent care and states they told her not to worry. She states she did an evist and was prescribed Bactrim  on 05/31/23 and states it is worsening. No appts available today with PCP or at primary care clinic, patient states she does not want to go back to urgent care. Advised she go to ED. Patient is agreeable and is requesting if Dr Georgia Kipper can send orders to [facility] or direct admit her. Called [facility] and made staff aware of patient's request.  Copied from CRM [id number]. Topic: Clinical - Red Word Triage >> Jun 06, 2023  8:05 AM Clydene Darner H wrote: Red Word that prompted transfer to Nurse Triage: Patient called requesting a same-day appointment for today. She reported that around 04/17, she initially developed one boil, which has since progressed to five. She thinks it may have developed into cellulitis. Patient visited Urgent Care previously, where she was dismissed, and also completed an e-visit last Saturday. She has been on antibiotics since then. As of last night, the condition has worsened and she experienced a fever of 100.106F. Patient is now unsure whether she should return to Urgent Care or wait to be seen by Dr. Georgia Kipper. Reason for Disposition  [1] Treatment (antibiotic, I&D, or moist heat) > 24 hours AND [2] new-onset fever  Answer Assessment - Initial Assessment Questions 1. APPEARANCE: "What does the boil (abscess) look like?"       Swollen, red, warm, clustered  2. LOCATION: "Where is the boil located?"      Right arm armpit/shoulder area.  3. NUMBER: "How many boils are there?"      "It looks like 5 that are combined"  4. SIZE: "How big is the boil?" (e.g., inches, cm; compare to size of a coin or other object)     The size of a golf ball to tennis ball.  5. ONSET: "When did the boil start?"     X 3 weeks.  6. PAIN: "Is there any pain?" If Yes, ask: "How bad is the pain?"  (Scale 1-10; or mild, moderate, severe)     5/10.  7. FEVER: "Do you have a fever?" If Yes, ask: "What is it, how was it measured, and when did it start?"      Last night 100.1  8. TREATMENT: "What treatment did you get or are you getting for the boil?" (e.g., I&D, antibiotics, moist heat)     Bactrim , warm heating pad/applies hot water while in the shower  9. OTHER SYMPTOMS: "Do you have any other symptoms?" (e.g., rash elsewhere on body, shaking chills, spreading redness of nearby skin or red streaks, weakness)      Pus and bloody drainage this morning.  10. PREGNANCY: "Is there any chance you are pregnant?" "When was your last menstrual period?"       N/A.  Protocols used: Boil (Skin Abscess) on Treatment Follow-up Call-A-[facility]

## 2023-06-06 NOTE — Progress Notes (Signed)
 Pharmacy Antibiotic Note  [name] is a 53 y.o. female admitted on 06/06/2023 with cellulitis.  Right armpit multiple small abscesses not improved on oral antibiotics.  Pharmacy has been consulted for dalbavancin dosing.  Today, 06/06/23 - Patient c/o abscess in R axilla.  Patient reports she has been taking Bactrim  since 05/31/2023 and has had no improvement. - No hypotension, no WBC obtained, no lactate obtained, current temp 97.7 but has reported fever, pulse rate 98, resp rate 18 - I&D procedure performed - Insurance status confirmed and ID aware - No exclusion criteria met   Plan: Dalbavancin 1500 mg IV x 1  Height: 5\' 5"  (165.1 cm) Weight: 61.2 kg (135 lb) IBW/kg (Calculated) : 57  Temp (24hrs), Avg:97.7 F (36.5 C), Min:97.7 F (36.5 C), Max:97.7 F (36.5 C)  No results for input(s): "WBC", "CREATININE", "LATICACIDVEN", "VANCOTROUGH", "VANCOPEAK", "VANCORANDOM", "GENTTROUGH", "GENTPEAK", "GENTRANDOM", "TOBRATROUGH", "TOBRAPEAK", "TOBRARND", "AMIKACINPEAK", "AMIKACINTROU", "AMIKACIN" in the last 168 hours.  CrCl cannot be calculated (Patient's most recent lab result is older than the maximum 21 days allowed.).    Allergies  Allergen Reactions   Lisinopril Cough   Fentanyl  Nausea And Vomiting   Nizoral  [Ketoconazole ]     Felt drunk w/tablets   Amoxicillin Itching    REACTION: QUESTIONABLE   Ancef [Cefazolin] Itching   Doxycycline Nausea And Vomiting   Hydrochlorothiazide  Other (See Comments)    CRAMPS AT HIGHER DOSAGES   Penicillins Itching    Has patient had a PCN reaction causing immediate rash, facial/tongue/throat swelling, SOB or lightheadedness with hypotension: No Has patient had a PCN reaction causing severe rash involving mucus membranes or skin necrosis: No Has patient had a PCN reaction that required hospitalization No Has patient had a PCN reaction occurring within the last 10 years: No If all of the above answers are "NO", then may proceed with  Cephalosporin use.    Percocet [Oxycodone -Acetaminophen ] Itching   Talwin [Pentazocine] Nausea And Vomiting      Thank you for allowing pharmacy to be a part of this patient's care.  Alfredo Inch, PharmD, BCPS Clinical Pharmacist [place] 06/06/2023 11:44 AM

## 2023-06-06 NOTE — ED Provider Notes (Signed)
 [place] EMERGENCY DEPARTMENT AT [facility] Provider Note   CSN: [id number] Arrival date & time: 06/06/23  0915     History  Chief Complaint  Patient presents with   Abscess   HPI [name] is a 53 y.o. female presenting for abscess.  States she noticed 2 abscesses forming about her right axilla about 2 weeks ago.  Was seen in urgent care and was started on Bactrim  but abscesses were not drained at that time.  She does report a history of MRSA.  States she has had an abscess in that area before.  Denies fever and chills.  Denies oozing or bleeding from the site.  States been taking the Bactrim  as prescribed since Saturday but the area is under her armpit are more red, swollen, and tender.   Abscess      Home Medications Prior to Admission medications   Medication Sig Start Date End Date Taking? Authorizing Provider  albuterol  (VENTOLIN  HFA) 108 (90 Base) MCG/ACT inhaler Inhale 2 puffs into the lungs every 6 (six) hours as needed for wheezing or shortness of breath. 04/07/23   Plotnikov, Oakley Bellman, MD  aspirin  EC 81 MG tablet Take 81 mg by mouth daily.    [provider]  atorvastatin  (LIPITOR) 40 MG tablet TAKE 1 TABLET BY MOUTH DAILY 04/25/23   Croitoru, Mihai, MD  azelastine  (ASTELIN ) 0.1 % nasal spray Place 2 sprays into both nostrils 2 (two) times daily. Use in each nostril as directed 04/13/23   Marciana Settle, PA-C  B Complex-Folic Acid  (B COMPLEX PLUS) TABS Take 1 tablet by mouth daily. Patient not taking: Reported on 03/21/2023 02/18/22   Plotnikov, Oakley Bellman, MD  benzonatate  (TESSALON ) 100 MG capsule Take 1 capsule (100 mg total) by mouth 2 (two) times daily as needed for cough. 04/13/23   Marciana Settle, PA-C  celecoxib  (CELEBREX ) 200 MG capsule TAKE 1 CAPSULE BY MOUTH TWICE  DAILY 03/07/23   Plotnikov, Aleksei V, MD  Cholecalciferol (VITAMIN D3) 50 MCG (2000 UT) capsule Take 1 capsule (2,000 Units total) by mouth daily. 08/30/20   Plotnikov, Aleksei V,  MD  cyclobenzaprine (FLEXERIL) 10 MG tablet Take 10 mg by mouth 3 (three) times daily.    [provider]  diclofenac  Sodium (VOLTAREN ) 1 % GEL Apply 4 g topically 4 (four) times daily.    [provider]  erythromycin  ophthalmic ointment PLACE 1 APPLICATION INTO THE RIGHT EYE 3 TIMES DAILY. 04/23/23   Plotnikov, Aleksei V, MD  estradiol  (ESTRACE ) 0.5 MG tablet TAKE 1 TABLET BY MOUTH DAILY 12/17/22   Plotnikov, Aleksei V, MD  famotidine  (PEPCID ) 40 MG tablet TAKE 1 TABLET BY MOUTH DAILY 03/26/23   Plotnikov, Aleksei V, MD  fluticasone  (FLONASE ) 50 MCG/ACT nasal spray Place 2 sprays into both nostrils daily. 02/27/23   Plotnikov, Aleksei V, MD  HYDROcodone -acetaminophen  (NORCO/VICODIN) 5-325 MG tablet Take 1 tablet by mouth every 6 (six) hours as needed for moderate pain. Patient not taking: Reported on 03/21/2023    [provider]  Hypromell-Glycerin-Naphazoline (CLEAR EYES FOR DRY EYES PLUS) 0.8-0.25-0.012 % SOLN Place 1 drop into the left eye daily.    [provider]  lamoTRIgine  (LAMICTAL ) 100 MG tablet TAKE 1 TABLET BY MOUTH TWICE  DAILY 05/20/23   Phebe Brasil, MD  LINZESS  145 MCG CAPS capsule TAKE 2 CAPSULES BY MOUTH IN THE  EVENING 03/10/23   Plotnikov, Aleksei V, MD  lisdexamfetamine (VYVANSE ) 60 MG capsule Take 60 mg by mouth daily. 03/10/23  [provider]  mupirocin  ointment (BACTROBAN ) 2 % APPLY TO AFFECTED AREA(S)  TOPICALLY ON BURN WOUNDS TWICE  DAILY 10/03/22   Plotnikov, Aleksei V, MD  naloxone  (NARCAN ) nasal spray 4 mg/0.1 mL Spray 1 spray into the nose once as needed for signs of overdose Patient not taking: Reported on 03/21/2023 11/30/21   Gray, Sarah E, NP  neomycin-polymyxin b-dexamethasone  (MAXITROL) 3.5-10000-0.1 OINT Place 1 Application into the right eye at bedtime. 03/13/23   [provider]  nitrofurantoin , macrocrystal-monohydrate, (MACROBID ) 100 MG capsule Take 1 capsule (100 mg total) by mouth 2 (two) times daily. 10/02/22    Farris Hong, PA-C  nortriptyline  (PAMELOR ) 25 MG capsule TAKE 1 TO 2 CAPSULES BY MOUTH AT BEDTIME 12/29/22   Plotnikov, Aleksei V, MD  oxyCODONE -acetaminophen  (PERCOCET) 10-325 MG tablet Take 1 tablet by mouth every 4 (four) hours as needed. 03/17/23   [provider]  pantoprazole  (PROTONIX ) 40 MG tablet TAKE 1 TABLET BY MOUTH DAILY 10/03/22   Plotnikov, Aleksei V, MD  pregabalin  (LYRICA ) 25 MG capsule Take 1 capsule (25 mg total) by mouth 3 (three) times daily. 04/14/23   Phebe Brasil, MD  RESTASIS  0.05 % ophthalmic emulsion INSTILL 1 DROP INTO THE RIGHT  EYE TWICE DAILY 04/27/23   Plotnikov, Aleksei V, MD  sulfamethoxazole -trimethoprim  (BACTRIM  DS) 800-160 MG tablet Take 1 tablet by mouth 2 (two) times daily for 7 days. 05/31/23 06/07/23  Fleming, Zelda W, NP  SUMAtriptan  (IMITREX ) 50 MG tablet Take 1 tablet (50 mg total) by mouth every 2 (two) hours as needed for migraine. May repeat in 2 hours if headache persists or recurs. 09/03/22   Phebe Brasil, MD  traZODone (DESYREL) 50 MG tablet Take 50 mg by mouth at bedtime.    [provider]      Allergies    Lisinopril, Fentanyl , Nizoral  [ketoconazole ], Amoxicillin, Ancef [cefazolin], Doxycycline, Hydrochlorothiazide , Penicillins, Percocet [oxycodone -acetaminophen ], and Talwin [pentazocine]    Review of Systems   See HPI  Physical Exam Updated Vital Signs BP (!) 130/98 (BP Location: Left Arm)   Pulse 98   Temp 97.7 F (36.5 C) (Oral)   Resp 18   Ht 5\' 5"  (1.651 m)   Wt 61.2 kg   SpO2 100%   BMI 22.47 kg/m  Physical Exam Vitals and nursing note reviewed.  HENT:     Head: Normocephalic and atraumatic.     Mouth/Throat:     Mouth: Mucous membranes are moist.  Eyes:     General:        Right eye: No discharge.        Left eye: No discharge.     Conjunctiva/sclera: Conjunctivae normal.  Cardiovascular:     Rate and Rhythm: Normal rate and regular rhythm.     Pulses: Normal pulses.     Heart sounds: Normal heart sounds.   Pulmonary:     Effort: Pulmonary effort is normal.     Breath sounds: Normal breath sounds.  Abdominal:     General: Abdomen is flat.     Palpations: Abdomen is soft.  Skin:    General: Skin is warm and dry.     Comments: For small abscesses noted about the right axilla.  Also with surrounding erythema and edema and tender to touch.  No oozing or bleeding noted. See picture below.  Neurological:     General: No focal deficit present.  Psychiatric:        Mood and Affect: Mood normal.     ED Results /  Procedures / Treatments   Labs (all labs ordered are listed, but only abnormal results are displayed) Labs Reviewed  AEROBIC CULTURE W GRAM STAIN (SUPERFICIAL SPECIMEN)    EKG None  Radiology No results found.  Procedures .Incision and Drainage  Date/Time: 06/06/2023 11:27 AM  Performed by: Janalee Mcmurray, PA-C Authorized by: Janalee Mcmurray, PA-C   Consent:    Consent obtained:  Verbal   Consent given by:  Patient   Risks discussed:  Bleeding, incomplete drainage, pain and damage to other organs   Alternatives discussed:  No treatment Universal protocol:    Procedure explained and questions answered to patient or proxy's satisfaction: yes     Relevant documents present and verified: yes     Test results available : yes     Imaging studies available: yes     Required blood products, implants, devices, and special equipment available: yes     Site/side marked: yes     Immediately prior to procedure, a time out was called: yes     Patient identity confirmed:  Verbally with patient Location:    Type:  Abscess Pre-procedure details:    Skin preparation:  Betadine Anesthesia:    Anesthesia method:  Local infiltration   Local anesthetic:  Lidocaine  1% WITH epi Procedure type:    Complexity:  Complex Procedure details:    Incision types:  Single straight   Incision depth:  Subcutaneous   Wound management:  Probed and deloculated, irrigated with saline and  extensive cleaning   Drainage:  Purulent   Drainage amount:  Scant   Wound treatment:  Wound left open   Packing materials:  None Post-procedure details:    Procedure completion:  Tolerated well, no immediate complications     Medications Ordered in ED Medications  dalbavancin (DALVANCE) 1,500 mg in dextrose  5 % 500 mL IVPB (1,500 mg Intravenous New Bag/Given 06/06/23 1210)  lidocaine  (PF) (XYLOCAINE ) 1 % injection 20 mL (20 mLs Infiltration Given by Other 06/06/23 1015)  Tdap (BOOSTRIX) injection 0.5 mL (0.5 mLs Intramuscular Given 06/06/23 1015)    ED Course/ Medical Decision Making/ A&P                                 Medical Decision Making Risk Prescription drug management.   53 year old presenting for abscess.  Exam notable for multiple small abscesses about the right axilla likely surrounding cellulitis.  I&D procedure was well-tolerated.  Had a discussion about patient with pharmacy.  Agreed to treat with Dalvance since she has been on Bactrim  from almost a week and symptoms seem to be worsening.  Advised her to follow-up with ID and her PCP.  Discussed return precautions.  She received Dalvance and reassessed: Appeared well, hemodynamically stable in no acute distress.  Safer discharge.  Discharged in good condition.        Final Clinical Impression(s) / ED Diagnoses Final diagnoses:  Abscess    Rx / DC Orders ED Discharge Orders     None         Janalee Mcmurray, PA-C 06/06/23 1226    Kommor, [place], MD 06/06/23 [phone]

## 2023-06-06 NOTE — ED Notes (Signed)
 DO came in room and insisted to hold on labs.

## 2023-06-09 DIAGNOSIS — R202 Paresthesia of skin: Secondary | ICD-10-CM | POA: Diagnosis not present

## 2023-06-09 DIAGNOSIS — Z79891 Long term (current) use of opiate analgesic: Secondary | ICD-10-CM | POA: Diagnosis not present

## 2023-06-09 DIAGNOSIS — G894 Chronic pain syndrome: Secondary | ICD-10-CM | POA: Diagnosis not present

## 2023-06-09 NOTE — Telephone Encounter (Signed)
 Agree with urgent care or ER.  Thanks

## 2023-06-10 ENCOUNTER — Ambulatory Visit (INDEPENDENT_AMBULATORY_CARE_PROVIDER_SITE_OTHER): Admitting: Infectious Diseases

## 2023-06-10 ENCOUNTER — Other Ambulatory Visit: Payer: Self-pay

## 2023-06-10 ENCOUNTER — Encounter: Payer: Self-pay | Admitting: Infectious Diseases

## 2023-06-10 VITALS — BP 131/90 | HR 107 | Temp 98.3°F | Wt 126.0 lb

## 2023-06-10 DIAGNOSIS — Z79899 Other long term (current) drug therapy: Secondary | ICD-10-CM | POA: Insufficient documentation

## 2023-06-10 DIAGNOSIS — Z720 Tobacco use: Secondary | ICD-10-CM | POA: Insufficient documentation

## 2023-06-10 DIAGNOSIS — L02419 Cutaneous abscess of limb, unspecified: Secondary | ICD-10-CM | POA: Insufficient documentation

## 2023-06-10 DIAGNOSIS — A4902 Methicillin resistant Staphylococcus aureus infection, unspecified site: Secondary | ICD-10-CM

## 2023-06-10 LAB — AEROBIC CULTURE W GRAM STAIN (SUPERFICIAL SPECIMEN): Gram Stain: NONE SEEN

## 2023-06-10 NOTE — Telephone Encounter (Signed)
 Pt was seen in the ED as instructed.
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
## 2023-06-11 ENCOUNTER — Telehealth (HOSPITAL_BASED_OUTPATIENT_CLINIC_OR_DEPARTMENT_OTHER): Payer: Self-pay

## 2023-06-11 DIAGNOSIS — A4902 Methicillin resistant Staphylococcus aureus infection, unspecified site: Secondary | ICD-10-CM | POA: Insufficient documentation

## 2023-06-11 NOTE — Telephone Encounter (Signed)
 Post ED Visit - Positive Culture Follow-up  Culture report reviewed by antimicrobial stewardship pharmacist: [facility] []  [address], Pharm.D. []  Skeet Duke, Pharm.D., BCPS AQ-ID []  Leslee Rase, Pharm.D., BCPS [x]  Garland Junk, Pharm.D., BCPS []  [place], [place].D., BCPS, AAHIVP []  Alcide Aly, Pharm.D., BCPS, AAHIVP []  Jerri Morale, PharmD, BCPS []  Graham Laws, PharmD, BCPS []  Cleda Curly, PharmD, BCPS []  Tamar Fairly, PharmD []  [place] Levels, PharmD, BCPS []  Ollen Beverage, PharmD  [facility] []  Arlyne Bering, PharmD []  Sherryle Don, PharmD []  Van Gelin[place], PharmD []  Delila Felty, Rph []  [place]) Cleora Daft, PharmD []  Augustina Block, PharmD []  Arie Kurtz, PharmD []  Sharlyn Deaner, PharmD []  Agnes Hose, PharmD []  Kendall Pauls, PharmD []  Gladstone Lamer, PharmD []  Armanda Bern, PharmD []  Tera Fellows, PharmD   Positive wound culture Treated with Dalbavancin, organism sensitive to the same and no further patient follow-up is required at this time.  Delena Feil 06/11/2023, 8:32 AM

## 2023-06-27 DIAGNOSIS — H5440 Blindness, one eye, unspecified eye: Secondary | ICD-10-CM | POA: Diagnosis not present

## 2023-06-27 DIAGNOSIS — H54415A Blindness right eye category 5, normal vision left eye: Secondary | ICD-10-CM | POA: Diagnosis not present

## 2023-06-27 DIAGNOSIS — S0571XA Avulsion of right eye, initial encounter: Secondary | ICD-10-CM | POA: Diagnosis not present

## 2023-06-27 DIAGNOSIS — H54413A Blindness right eye category 3, normal vision left eye: Secondary | ICD-10-CM | POA: Diagnosis not present

## 2023-06-27 DIAGNOSIS — H5711 Ocular pain, right eye: Secondary | ICD-10-CM | POA: Diagnosis not present

## 2023-06-27 DIAGNOSIS — Z880 Allergy status to penicillin: Secondary | ICD-10-CM | POA: Diagnosis not present

## 2023-06-27 DIAGNOSIS — I471 Supraventricular tachycardia, unspecified: Secondary | ICD-10-CM | POA: Diagnosis not present

## 2023-06-27 DIAGNOSIS — E119 Type 2 diabetes mellitus without complications: Secondary | ICD-10-CM | POA: Diagnosis not present

## 2023-06-27 DIAGNOSIS — F17210 Nicotine dependence, cigarettes, uncomplicated: Secondary | ICD-10-CM | POA: Diagnosis not present

## 2023-06-27 DIAGNOSIS — I2510 Atherosclerotic heart disease of native coronary artery without angina pectoris: Secondary | ICD-10-CM | POA: Diagnosis not present

## 2023-06-27 DIAGNOSIS — J45909 Unspecified asthma, uncomplicated: Secondary | ICD-10-CM | POA: Diagnosis not present

## 2023-06-27 DIAGNOSIS — K219 Gastro-esophageal reflux disease without esophagitis: Secondary | ICD-10-CM | POA: Diagnosis not present

## 2023-06-27 HISTORY — PX: ENUCLEATION: SHX628

## 2023-06-30 ENCOUNTER — Ambulatory Visit: Admitting: Infectious Diseases

## 2023-07-14 DIAGNOSIS — G894 Chronic pain syndrome: Secondary | ICD-10-CM | POA: Diagnosis not present

## 2023-07-14 DIAGNOSIS — R202 Paresthesia of skin: Secondary | ICD-10-CM | POA: Diagnosis not present

## 2023-07-14 DIAGNOSIS — Z79891 Long term (current) use of opiate analgesic: Secondary | ICD-10-CM | POA: Diagnosis not present

## 2023-07-22 DIAGNOSIS — M25561 Pain in right knee: Secondary | ICD-10-CM | POA: Diagnosis not present

## 2023-08-11 DIAGNOSIS — Z79891 Long term (current) use of opiate analgesic: Secondary | ICD-10-CM | POA: Diagnosis not present

## 2023-08-11 DIAGNOSIS — G894 Chronic pain syndrome: Secondary | ICD-10-CM | POA: Diagnosis not present

## 2023-08-18 ENCOUNTER — Ambulatory Visit (INDEPENDENT_AMBULATORY_CARE_PROVIDER_SITE_OTHER): Admitting: Internal Medicine

## 2023-08-18 ENCOUNTER — Encounter: Payer: Self-pay | Admitting: Internal Medicine

## 2023-08-18 VITALS — BP 108/67 | HR 84 | Temp 98.3°F | Ht 65.0 in | Wt 126.0 lb

## 2023-08-18 DIAGNOSIS — S069X1S Unspecified intracranial injury with loss of consciousness of 30 minutes or less, sequela: Secondary | ICD-10-CM

## 2023-08-18 DIAGNOSIS — E559 Vitamin D deficiency, unspecified: Secondary | ICD-10-CM

## 2023-08-18 DIAGNOSIS — G8929 Other chronic pain: Secondary | ICD-10-CM

## 2023-08-18 DIAGNOSIS — I10 Essential (primary) hypertension: Secondary | ICD-10-CM

## 2023-08-18 DIAGNOSIS — R635 Abnormal weight gain: Secondary | ICD-10-CM

## 2023-08-18 DIAGNOSIS — H5440 Blindness, one eye, unspecified eye: Secondary | ICD-10-CM

## 2023-08-18 DIAGNOSIS — R1032 Left lower quadrant pain: Secondary | ICD-10-CM | POA: Diagnosis not present

## 2023-08-18 DIAGNOSIS — S50861A Insect bite (nonvenomous) of right forearm, initial encounter: Secondary | ICD-10-CM

## 2023-08-18 DIAGNOSIS — M545 Low back pain, unspecified: Secondary | ICD-10-CM | POA: Diagnosis not present

## 2023-08-18 DIAGNOSIS — W57XXXA Bitten or stung by nonvenomous insect and other nonvenomous arthropods, initial encounter: Secondary | ICD-10-CM | POA: Diagnosis not present

## 2023-08-18 MED ORDER — LEVOFLOXACIN 250 MG PO TABS: 250.0000 mg | ORAL_TABLET | Freq: Every day | ORAL | 0 refills | Status: DC

## 2023-08-18 MED ORDER — MUPIROCIN 2 % EX OINT: TOPICAL_OINTMENT | CUTANEOUS | 0 refills | Status: DC

## 2023-08-18 MED ORDER — FLUCONAZOLE 150 MG PO TABS: 150.0000 mg | ORAL_TABLET | Freq: Once | ORAL | 1 refills | Status: DC

## 2023-08-18 MED ORDER — CEPHALEXIN 500 MG PO CAPS: 500.0000 mg | ORAL_CAPSULE | Freq: Four times a day (QID) | ORAL | 1 refills | Status: DC

## 2023-08-18 NOTE — Assessment & Plan Note (Signed)
 Placed a referral to Gastro abd pain (lower abd pain LLQ, painful sex; GYN w/up w/pelvic US  etc was (-)). No fever, d/c S/p partial hysterectomy Using Linzess 

## 2023-08-18 NOTE — Assessment & Plan Note (Signed)
 Pt lost wt on compound Zepbound 

## 2023-08-18 NOTE — Assessment & Plan Note (Signed)
    Pain med Ms Daune, NP (on Oxy) - [place]

## 2023-08-18 NOTE — Assessment & Plan Note (Signed)
 R eye was removed in May 2025 - prosthesis

## 2023-08-18 NOTE — Assessment & Plan Note (Signed)
 Continue with vitamin D

## 2023-08-18 NOTE — Assessment & Plan Note (Signed)
Recovering  

## 2023-08-18 NOTE — Progress Notes (Signed)
 Subjective:  Patient ID: [name], female    DOB: 05-02-1970  Age: 53 y.o. MRN: [id number]  CC: Insect Bite (Pt has a Spider bite on rt arm with redness, swelling, opening at bite site and drainage... Pt is also needing a referral to Gastro.)   HPI [name] presents for a R forearm spider bite on rt arm with redness, swelling, opening at bite site and drainage... Pt is also needing a referral to Gastro abd pain (lower abd pain LLQ, painful sex; GYN w/up w/pelvic US  etc was (-)). No fever, d/c R eye was removed  Pt lost wt on compound Trisepeptide  Outpatient Medications Prior to Visit  Medication Sig Dispense Refill   albuterol  (VENTOLIN  HFA) 108 (90 Base) MCG/ACT inhaler Inhale 2 puffs into the lungs every 6 (six) hours as needed for wheezing or shortness of breath. 8 g 0   aspirin  EC 81 MG tablet Take 81 mg by mouth daily.     atorvastatin  (LIPITOR) 40 MG tablet TAKE 1 TABLET BY MOUTH DAILY 90 tablet 3   azelastine  (ASTELIN ) 0.1 % nasal spray Place 2 sprays into both nostrils 2 (two) times daily. Use in each nostril as directed 30 mL 12   B Complex-Folic Acid  (B COMPLEX PLUS) TABS Take 1 tablet by mouth daily. 100 tablet 3   benzonatate  (TESSALON ) 100 MG capsule Take 1 capsule (100 mg total) by mouth 2 (two) times daily as needed for cough. 20 capsule 0   celecoxib  (CELEBREX ) 200 MG capsule TAKE 1 CAPSULE BY MOUTH TWICE  DAILY 200 capsule 2   Cholecalciferol (VITAMIN D3) 50 MCG (2000 UT) capsule Take 1 capsule (2,000 Units total) by mouth daily. 100 capsule 3   cyclobenzaprine (FLEXERIL) 10 MG tablet Take 10 mg by mouth 3 (three) times daily.     diclofenac  Sodium (VOLTAREN ) 1 % GEL Apply 4 g topically 4 (four) times daily.     erythromycin  ophthalmic ointment PLACE 1 APPLICATION INTO THE RIGHT EYE 3 TIMES DAILY. 3.5 g 2   estradiol  (ESTRACE ) 0.5 MG tablet TAKE 1 TABLET BY MOUTH DAILY 100 tablet 2   famotidine  (PEPCID ) 40 MG tablet TAKE 1 TABLET BY MOUTH DAILY 100 tablet  2   fluticasone  (FLONASE ) 50 MCG/ACT nasal spray Place 2 sprays into both nostrils daily. 48 g 3   HYDROcodone -acetaminophen  (NORCO/VICODIN) 5-325 MG tablet Take 1 tablet by mouth every 6 (six) hours as needed for moderate pain.     Hypromell-Glycerin-Naphazoline (CLEAR EYES FOR DRY EYES PLUS) 0.8-0.25-0.012 % SOLN Place 1 drop into the left eye daily.     lamoTRIgine  (LAMICTAL ) 100 MG tablet TAKE 1 TABLET BY MOUTH TWICE  DAILY 200 tablet 2   LINZESS  145 MCG CAPS capsule TAKE 2 CAPSULES BY MOUTH IN THE  EVENING 180 capsule 3   lisdexamfetamine (VYVANSE ) 60 MG capsule Take 60 mg by mouth daily.     naloxone  (NARCAN ) nasal spray 4 mg/0.1 mL Spray 1 spray into the nose once as needed for signs of overdose 1 each 1   neomycin-polymyxin b-dexamethasone  (MAXITROL) 3.5-10000-0.1 OINT Place 1 Application into the right eye at bedtime.     nitrofurantoin , macrocrystal-monohydrate, (MACROBID ) 100 MG capsule Take 1 capsule (100 mg total) by mouth 2 (two) times daily. 10 capsule 0   nortriptyline  (PAMELOR ) 25 MG capsule TAKE 1 TO 2 CAPSULES BY MOUTH AT BEDTIME 180 capsule 3   oxyCODONE -acetaminophen  (PERCOCET) 10-325 MG tablet Take 1 tablet by mouth every 4 (four) hours as needed.  pantoprazole  (PROTONIX ) 40 MG tablet TAKE 1 TABLET BY MOUTH DAILY 100 tablet 2   pregabalin  (LYRICA ) 25 MG capsule Take 1 capsule (25 mg total) by mouth 3 (three) times daily. 270 capsule 3   RESTASIS  0.05 % ophthalmic emulsion INSTILL 1 DROP INTO THE RIGHT  EYE TWICE DAILY 60 each 11   SUMAtriptan  (IMITREX ) 50 MG tablet Take 1 tablet (50 mg total) by mouth every 2 (two) hours as needed for migraine. May repeat in 2 hours if headache persists or recurs. 10 tablet 6   traZODone (DESYREL) 50 MG tablet Take 50 mg by mouth at bedtime.     mupirocin  ointment (BACTROBAN ) 2 % APPLY TO AFFECTED AREA(S)  TOPICALLY ON BURN WOUNDS TWICE  DAILY 30 g 0   No facility-administered medications prior to visit.    ROS: Review of Systems   Constitutional:  Positive for unexpected weight change. Negative for activity change, appetite change, chills and fatigue.  HENT:  Negative for congestion, mouth sores and sinus pressure.   Eyes:  Positive for pain and visual disturbance.  Respiratory:  Negative for cough and chest tightness.   Gastrointestinal:  Positive for abdominal pain. Negative for nausea.  Genitourinary:  Negative for difficulty urinating, frequency and vaginal pain.  Musculoskeletal:  Positive for back pain. Negative for gait problem.  Skin:  Negative for pallor and rash.  Neurological:  Negative for dizziness, tremors, weakness, numbness and headaches.  Psychiatric/Behavioral:  Positive for dysphoric mood and sleep disturbance. Negative for confusion.     Objective:  BP 108/67   Pulse 84   Temp 98.3 F (36.8 C) (Oral)   Ht 5' 5 (1.651 m)   Wt 126 lb (57.2 kg)   SpO2 95%   BMI 20.97 kg/m   BP Readings from Last 3 Encounters:  08/18/23 108/67  06/10/23 (!) 131/90  06/06/23 (!) 130/98    Wt Readings from Last 3 Encounters:  08/18/23 126 lb (57.2 kg)  06/10/23 126 lb (57.2 kg)  06/06/23 135 lb (61.2 kg)    Physical Exam Constitutional:      General: She is not in acute distress.    Appearance: She is well-developed.  HENT:     Head: Normocephalic.     Right Ear: External ear normal.     Left Ear: External ear normal.     Nose: Nose normal.  Eyes:     General:        Right eye: No discharge.        Left eye: No discharge.     Conjunctiva/sclera: Conjunctivae normal.     Pupils: Pupils are equal, round, and reactive to light.  Neck:     Thyroid : No thyromegaly.     Vascular: No JVD.     Trachea: No tracheal deviation.  Cardiovascular:     Rate and Rhythm: Normal rate and regular rhythm.     Heart sounds: Normal heart sounds.  Pulmonary:     Effort: No respiratory distress.     Breath sounds: No stridor. No wheezing.  Abdominal:     General: Bowel sounds are normal. There is no  distension.     Palpations: Abdomen is soft. There is no mass.     Tenderness: There is no abdominal tenderness. There is no guarding or rebound.  Musculoskeletal:        General: No tenderness.     Cervical back: Normal range of motion and neck supple. No rigidity.  Lymphadenopathy:     Cervical: No  cervical adenopathy.  Skin:    Findings: No erythema or rash.  Neurological:     Mental Status: She is oriented to person, place, and time.     Cranial Nerves: No cranial nerve deficit.     Motor: No abnormal muscle tone.     Coordination: Coordination normal.     Gait: Gait normal.     Deep Tendon Reflexes: Reflexes normal.  Psychiatric:        Behavior: Behavior normal.        Thought Content: Thought content normal.        Judgment: Judgment normal.    Thin Skin lesion:   Lab Results  Component Value Date   WBC 4.7 03/21/2023   HGB 15.2 (H) 03/21/2023   HCT 43.8 03/21/2023   PLT 198.0 03/21/2023   GLUCOSE 97 03/21/2023   CHOL 165 08/18/2020   TRIG 145 08/18/2020   HDL 35 (L) 08/18/2020   LDLDIRECT 121.0 02/26/2018   LDLCALC 104 (H) 08/18/2020   ALT 12 10/01/2022   AST 14 10/01/2022   NA 139 03/21/2023   K 4.6 03/21/2023   CL 104 03/21/2023   CREATININE 0.63 03/21/2023   BUN 12 03/21/2023   CO2 28 03/21/2023   TSH 2.89 10/01/2022   INR 1.05 08/29/2014   HGBA1C 4.8 03/21/2023    No results found.  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 Epsom salt  paste Levaquin  po if worse, Bactroban        LLQ abdominal pain   Placed a referral to Gastro abd pain (lower abd pain LLQ, painful sex; GYN w/up w/pelvic US  etc was (-)). No fever, d/c S/p partial hysterectomy Using Linzess       Relevant Orders   Ambulatory referral to Gastroenterology      Meds ordered this encounter  Medications   mupirocin  ointment (BACTROBAN ) 2 %    Sig: APPLY TO AFFECTED AREA(S)  TOPICALLY ON BURN WOUNDS TWICE  DAILY    Dispense:  30 g    Refill:  0   DISCONTD: cephALEXin  (KEFLEX ) 500 MG capsule    Sig: Take 1 capsule (500 mg total) by mouth 4 (four) times daily.    Dispense:  20 capsule    Refill:  1   fluconazole  (DIFLUCAN ) 150 MG tablet    Sig: Take 1 tablet (150 mg total) by mouth once for 1 dose.    Dispense:  1 tablet    Refill:  1   levofloxacin  (LEVAQUIN ) 250 MG tablet    Sig: Take 1 tablet (250 mg total) by mouth daily for 7 days.    Dispense:  7 tablet    Refill:  0    Disregard Keflex  Rx      Follow-up: Return in about  3 months (around 11/18/2023) for a follow-up visit.  Marolyn Noel, MD

## 2023-08-18 NOTE — Assessment & Plan Note (Signed)
On Metoprolol

## 2023-08-18 NOTE — Assessment & Plan Note (Addendum)
 New -  a R forearm spider bite on rt arm with redness, swelling, opening at bite site and drainage. Epsom salt paste Levaquin  po if worse, Bactroban 

## 2023-08-19 ENCOUNTER — Other Ambulatory Visit: Payer: Self-pay

## 2023-08-19 ENCOUNTER — Other Ambulatory Visit: Payer: Self-pay | Admitting: Internal Medicine

## 2023-08-19 ENCOUNTER — Encounter: Payer: Self-pay | Admitting: Internal Medicine

## 2023-08-19 DIAGNOSIS — R059 Cough, unspecified: Secondary | ICD-10-CM

## 2023-08-21 ENCOUNTER — Other Ambulatory Visit: Payer: Self-pay | Admitting: Internal Medicine

## 2023-08-21 MED ORDER — VITAMIN D3 50 MCG (2000 UT) PO CAPS: 2000.0000 [IU] | ORAL_CAPSULE | Freq: Every day | ORAL | 3 refills | Status: AC

## 2023-08-21 MED ORDER — BUDESONIDE-FORMOTEROL FUMARATE 160-4.5 MCG/ACT IN AERO: 2.0000 | INHALATION_SPRAY | Freq: Two times a day (BID) | RESPIRATORY_TRACT | 3 refills | Status: AC

## 2023-08-21 MED ORDER — B COMPLEX VITAMINS PO CAPS: 1.0000 | ORAL_CAPSULE | Freq: Every day | ORAL | 3 refills | Status: AC

## 2023-08-22 DIAGNOSIS — Z4421 Encounter for fitting and adjustment of artificial right eye: Secondary | ICD-10-CM | POA: Diagnosis not present
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
## 2023-09-03 ENCOUNTER — Encounter: Payer: Self-pay | Admitting: Adult Health

## 2023-09-03 ENCOUNTER — Ambulatory Visit: Payer: Medicare Other | Admitting: Adult Health

## 2023-09-03 VITALS — BP 118/82 | HR 90 | Ht 65.0 in | Wt 126.6 lb

## 2023-09-03 DIAGNOSIS — G43709 Chronic migraine without aura, not intractable, without status migrainosus: Secondary | ICD-10-CM

## 2023-09-03 DIAGNOSIS — R519 Headache, unspecified: Secondary | ICD-10-CM

## 2023-09-03 DIAGNOSIS — S069X4S Unspecified intracranial injury with loss of consciousness of 6 hours to 24 hours, sequela: Secondary | ICD-10-CM | POA: Diagnosis not present

## 2023-09-03 DIAGNOSIS — I609 Nontraumatic subarachnoid hemorrhage, unspecified: Secondary | ICD-10-CM

## 2023-09-03 DIAGNOSIS — R42 Dizziness and giddiness: Secondary | ICD-10-CM | POA: Diagnosis not present

## 2023-09-03 DIAGNOSIS — G44209 Tension-type headache, unspecified, not intractable: Secondary | ICD-10-CM

## 2023-09-03 MED ORDER — SUMATRIPTAN SUCCINATE 50 MG PO TABS: 50.0000 mg | ORAL_TABLET | ORAL | 11 refills | Status: AC | PRN

## 2023-09-03 NOTE — Patient Instructions (Addendum)
 Your Plan:  Continue nortriptyline  for headaches - can try to increase to 50mg  nightly   Continue sumatriptan  as needed for migraine rescue - refill provided but ongoing refills can be obtained by your PCP  Continue to follow with pain management as scheduled      Follow up as needed at this time but please call with any questions or concerns in the future       Thank you for coming to see us  at [facility]. I hope we have been able to provide you high quality care today.  You may receive a patient satisfaction survey over the next few weeks. We would appreciate your feedback and comments so that we may continue to improve ourselves and the health of our patients.

## 2023-09-11 ENCOUNTER — Other Ambulatory Visit: Payer: Self-pay | Admitting: Internal Medicine

## 2023-09-15 DIAGNOSIS — R519 Headache, unspecified: Secondary | ICD-10-CM | POA: Diagnosis not present

## 2023-09-15 DIAGNOSIS — G894 Chronic pain syndrome: Secondary | ICD-10-CM | POA: Diagnosis not present

## 2023-09-23 ENCOUNTER — Telehealth: Admitting: Physician Assistant

## 2023-09-23 DIAGNOSIS — R3989 Other symptoms and signs involving the genitourinary system: Secondary | ICD-10-CM

## 2023-09-23 MED ORDER — SULFAMETHOXAZOLE-TRIMETHOPRIM 800-160 MG PO TABS: 1.0000 | ORAL_TABLET | Freq: Two times a day (BID) | ORAL | 0 refills | Status: DC

## 2023-09-23 NOTE — Progress Notes (Signed)
 I have spent 5 minutes in review of e-visit questionnaire, review and updating patient chart, medical decision making and response to patient.   Elsie Velma Lunger, PA-C

## 2023-09-23 NOTE — Progress Notes (Signed)

## 2023-10-10 ENCOUNTER — Encounter: Payer: Self-pay | Admitting: Internal Medicine

## 2023-10-13 DIAGNOSIS — R519 Headache, unspecified: Secondary | ICD-10-CM | POA: Diagnosis not present

## 2023-10-13 DIAGNOSIS — Z970 Presence of artificial eye: Secondary | ICD-10-CM | POA: Diagnosis not present

## 2023-10-13 DIAGNOSIS — R202 Paresthesia of skin: Secondary | ICD-10-CM | POA: Diagnosis not present

## 2023-10-13 DIAGNOSIS — M25559 Pain in unspecified hip: Secondary | ICD-10-CM | POA: Diagnosis not present

## 2023-10-13 DIAGNOSIS — G894 Chronic pain syndrome: Secondary | ICD-10-CM | POA: Diagnosis not present

## 2023-10-14 DIAGNOSIS — G894 Chronic pain syndrome: Secondary | ICD-10-CM | POA: Diagnosis not present

## 2023-10-14 DIAGNOSIS — R202 Paresthesia of skin: Secondary | ICD-10-CM | POA: Diagnosis not present

## 2023-10-14 DIAGNOSIS — Z79891 Long term (current) use of opiate analgesic: Secondary | ICD-10-CM | POA: Diagnosis not present

## 2023-10-14 DIAGNOSIS — M47897 Other spondylosis, lumbosacral region: Secondary | ICD-10-CM | POA: Diagnosis not present

## 2023-10-26 ENCOUNTER — Other Ambulatory Visit: Payer: Self-pay | Admitting: Internal Medicine

## 2023-10-27 ENCOUNTER — Other Ambulatory Visit: Payer: Self-pay | Admitting: Internal Medicine

## 2023-11-06 ENCOUNTER — Encounter: Payer: Self-pay | Admitting: Internal Medicine

## 2023-11-10 DIAGNOSIS — G894 Chronic pain syndrome: Secondary | ICD-10-CM | POA: Diagnosis not present

## 2023-11-10 DIAGNOSIS — R202 Paresthesia of skin: Secondary | ICD-10-CM | POA: Diagnosis not present

## 2023-11-10 DIAGNOSIS — M47817 Spondylosis without myelopathy or radiculopathy, lumbosacral region: Secondary | ICD-10-CM | POA: Diagnosis not present

## 2023-11-10 DIAGNOSIS — Z79891 Long term (current) use of opiate analgesic: Secondary | ICD-10-CM | POA: Diagnosis not present

## 2023-11-10 DIAGNOSIS — M4302 Spondylolysis, cervical region: Secondary | ICD-10-CM | POA: Diagnosis not present

## 2023-11-18 ENCOUNTER — Ambulatory Visit: Admitting: Internal Medicine

## 2023-11-18 VITALS — BP 118/86 | HR 94 | Temp 98.1°F | Ht 65.0 in | Wt 124.0 lb

## 2023-11-18 DIAGNOSIS — H5461 Unqualified visual loss, right eye, normal vision left eye: Secondary | ICD-10-CM

## 2023-11-18 DIAGNOSIS — E785 Hyperlipidemia, unspecified: Secondary | ICD-10-CM | POA: Diagnosis not present

## 2023-11-18 DIAGNOSIS — Z1211 Encounter for screening for malignant neoplasm of colon: Secondary | ICD-10-CM | POA: Diagnosis not present

## 2023-11-18 DIAGNOSIS — R195 Other fecal abnormalities: Secondary | ICD-10-CM

## 2023-11-18 DIAGNOSIS — F419 Anxiety disorder, unspecified: Secondary | ICD-10-CM

## 2023-11-18 DIAGNOSIS — R739 Hyperglycemia, unspecified: Secondary | ICD-10-CM

## 2023-11-18 DIAGNOSIS — J4520 Mild intermittent asthma, uncomplicated: Secondary | ICD-10-CM | POA: Diagnosis not present

## 2023-11-18 DIAGNOSIS — I10 Essential (primary) hypertension: Secondary | ICD-10-CM | POA: Diagnosis not present

## 2023-11-18 LAB — CBC WITH DIFFERENTIAL/PLATELET
Basophils Absolute: 0 K/uL (ref 0.0–0.1)
Basophils Relative: 0.7 % (ref 0.0–3.0)
Eosinophils Absolute: 0.2 K/uL (ref 0.0–0.7)
Eosinophils Relative: 4 % (ref 0.0–5.0)
HCT: 40.9 % (ref 36.0–46.0)
Hemoglobin: 13.9 g/dL (ref 12.0–15.0)
Lymphocytes Relative: 42.8 % (ref 12.0–46.0)
Lymphs Abs: 2.1 K/uL (ref 0.7–4.0)
MCHC: 34.1 g/dL (ref 30.0–36.0)
MCV: 91.8 fl (ref 78.0–100.0)
Monocytes Absolute: 0.5 K/uL (ref 0.1–1.0)
Monocytes Relative: 10.4 % (ref 3.0–12.0)
Neutro Abs: 2.1 K/uL (ref 1.4–7.7)
Neutrophils Relative %: 42.1 % — ABNORMAL LOW (ref 43.0–77.0)
Platelets: 186 K/uL (ref 150.0–400.0)
RBC: 4.45 Mil/uL (ref 3.87–5.11)
RDW: 12.9 % (ref 11.5–15.5)
WBC: 4.9 K/uL (ref 4.0–10.5)

## 2023-11-18 LAB — COMPREHENSIVE METABOLIC PANEL WITH GFR
ALT: 16 U/L (ref 0–35)
AST: 17 U/L (ref 0–37)
Albumin: 4.3 g/dL (ref 3.5–5.2)
Alkaline Phosphatase: 102 U/L (ref 39–117)
BUN: 10 mg/dL (ref 6–23)
CO2: 29 meq/L (ref 19–32)
Calcium: 9 mg/dL (ref 8.4–10.5)
Chloride: 104 meq/L (ref 96–112)
Creatinine, Ser: 0.57 mg/dL (ref 0.40–1.20)
GFR: 105.55 mL/min (ref >60.00)
Glucose, Bld: 102 mg/dL — ABNORMAL HIGH (ref 70–99)
Potassium: 4 meq/L (ref 3.5–5.1)
Sodium: 140 meq/L (ref 135–145)
Total Bilirubin: 0.4 mg/dL (ref 0.2–1.2)
Total Protein: 6.8 g/dL (ref 6.0–8.3)

## 2023-11-18 LAB — LIPID PANEL
Cholesterol: 114 mg/dL (ref 0–200)
HDL: 45.3 mg/dL (ref >39.00)
LDL Cholesterol: 59 mg/dL (ref 0–99)
NonHDL: 68.68
Total CHOL/HDL Ratio: 3
Triglycerides: 46 mg/dL (ref 0.0–149.0)
VLDL: 9.2 mg/dL (ref 0.0–40.0)

## 2023-11-18 LAB — URINALYSIS
Bilirubin Urine: NEGATIVE
Hgb urine dipstick: NEGATIVE
Ketones, ur: NEGATIVE
Leukocytes,Ua: NEGATIVE
Nitrite: NEGATIVE
Specific Gravity, Urine: 1.015 (ref 1.000–1.030)
Total Protein, Urine: NEGATIVE
Urine Glucose: NEGATIVE
Urobilinogen, UA: 1 (ref 0.0–1.0)
pH: 6 (ref 5.0–8.0)

## 2023-11-18 LAB — TSH: TSH: 1.66 u[IU]/mL (ref 0.35–5.50)

## 2023-11-18 LAB — VITAMIN B12: Vitamin B-12: 632 pg/mL (ref 211–911)

## 2023-11-18 NOTE — Assessment & Plan Note (Signed)
On Metoprolol

## 2023-11-18 NOTE — Assessment & Plan Note (Signed)
 On Trazodone, Nortriptyline 

## 2023-11-18 NOTE — Assessment & Plan Note (Signed)
 On Mounjaro  2.5 mg/week

## 2023-11-18 NOTE — Assessment & Plan Note (Signed)
 Doing well w/prosthesis

## 2023-11-18 NOTE — Assessment & Plan Note (Signed)
Continue on Symbicort.  Ventolin as needed

## 2023-11-18 NOTE — Progress Notes (Signed)
 Subjective:  Patient ID: [name], female    DOB: [birth year]  Age: 53 y.o. MRN: [id number]  CC: Medical Management of Chronic Issues (3 Month follow up. Issues reaching out GI. Reviewing results for stool culture (noted blood found in stool))   HPI [name] presents for R eye new prosthesis, dyslipidemia, painful intercourse, ADD Home stool test was (+) for blood   Outpatient Medications Prior to Visit  Medication Sig Dispense Refill   albuterol  (VENTOLIN  HFA) 108 (90 Base) MCG/ACT inhaler INHALE 2 PUFFS INTO THE LUNGS  EVERY 6 (SIX) HOURS AS NEEDED  FOR WHEEZING OR SHORTNESS OF  BREATH. 18 g 2   aspirin  EC 81 MG tablet Take 81 mg by mouth daily.     atorvastatin  (LIPITOR) 40 MG tablet TAKE 1 TABLET BY MOUTH DAILY 90 tablet 3   azelastine  (ASTELIN ) 0.1 % nasal spray Place 2 sprays into both nostrils 2 (two) times daily. Use in each nostril as directed 30 mL 12   b complex vitamins capsule Take 1 capsule by mouth daily. 100 capsule 3   benzonatate  (TESSALON ) 100 MG capsule Take 1 capsule (100 mg total) by mouth 2 (two) times daily as needed for cough. 20 capsule 0   budesonide -formoterol  (SYMBICORT ) 160-4.5 MCG/ACT inhaler Inhale 2 puffs into the lungs 2 (two) times daily. 3 each 3   celecoxib  (CELEBREX ) 200 MG capsule TAKE 1 CAPSULE BY MOUTH TWICE  DAILY 200 capsule 2   Cholecalciferol (VITAMIN D3) 50 MCG (2000 UT) capsule Take 1 capsule (2,000 Units total) by mouth daily. 100 capsule 3   cyclobenzaprine (FLEXERIL) 10 MG tablet Take 10 mg by mouth 3 (three) times daily.     diclofenac  Sodium (VOLTAREN ) 1 % GEL Apply 4 g topically 4 (four) times daily.     estradiol  (ESTRACE ) 0.5 MG tablet TAKE 1 TABLET BY MOUTH DAILY 100 tablet 2   famotidine  (PEPCID ) 40 MG tablet TAKE 1 TABLET BY MOUTH DAILY 100 tablet 2   fluticasone  (FLONASE ) 50 MCG/ACT nasal spray Place 2 sprays into both nostrils daily. 48 g 3   LINZESS  145 MCG CAPS capsule TAKE 2 CAPSULES BY MOUTH IN THE  EVENING 180  capsule 3   lisdexamfetamine (VYVANSE ) 60 MG capsule Take 60 mg by mouth daily.     mupirocin  ointment (BACTROBAN ) 2 % APPLY TO AFFECTED AREA(S)  TOPICALLY ON BURN WOUNDS TWICE  DAILY 30 g 0   naloxone  (NARCAN ) nasal spray 4 mg/0.1 mL Spray 1 spray into the nose once as needed for signs of overdose (Patient taking differently: Place 0.4 mg into the nose once as needed. Spray 1 spray into the nose once as needed for signs of overdose) 1 each 1   neomycin-polymyxin b-dexamethasone  (MAXITROL) 3.5-10000-0.1 OINT Place 1 Application into the right eye at bedtime.     nitrofurantoin , macrocrystal-monohydrate, (MACROBID ) 100 MG capsule Take 1 capsule (100 mg total) by mouth 2 (two) times daily. 10 capsule 0   nortriptyline  (PAMELOR ) 25 MG capsule TAKE 1 TO 2 CAPSULES BY MOUTH AT BEDTIME 180 capsule 3   oxyCODONE -acetaminophen  (PERCOCET) 10-325 MG tablet Take 1 tablet by mouth every 4 (four) hours as needed.     pantoprazole  (PROTONIX ) 40 MG tablet TAKE 1 TABLET BY MOUTH DAILY 100 tablet 2   pregabalin  (LYRICA ) 100 MG capsule Take 100 mg by mouth 2 (two) times daily.     sulfamethoxazole -trimethoprim  (BACTRIM  DS) 800-160 MG tablet Take 1 tablet by mouth 2 (two) times daily. 10 tablet 0  SUMAtriptan  (IMITREX ) 50 MG tablet Take 1 tablet (50 mg total) by mouth every 2 (two) hours as needed for migraine. May repeat in 2 hours if headache persists or recurs. 10 tablet 11   tirzepatide  (MOUNJARO ) 7.5 MG/0.5ML Pen Inject 5 mg into the skin once a week.     traZODone (DESYREL) 50 MG tablet Take 50 mg by mouth at bedtime.     No facility-administered medications prior to visit.    ROS: Review of Systems  Constitutional:  Negative for activity change, appetite change, chills, fatigue and unexpected weight change.  HENT:  Negative for congestion, mouth sores and sinus pressure.   Eyes:  Negative for visual disturbance.  Respiratory:  Negative for cough and chest tightness.   Gastrointestinal:  Negative for  abdominal pain and nausea.  Genitourinary:  Negative for difficulty urinating, frequency and vaginal pain.  Musculoskeletal:  Negative for arthralgias, back pain and gait problem.  Skin:  Negative for pallor and rash.  Neurological:  Positive for headaches. Negative for dizziness, tremors, weakness and numbness.  Psychiatric/Behavioral:  Negative for confusion, sleep disturbance and suicidal ideas.     Objective:  BP 118/86   Pulse 94   Temp 98.1 F (36.7 C)   Ht 5' 5 (1.651 m)   Wt 124 lb (56.2 kg)   SpO2 93%   BMI 20.63 kg/m   BP Readings from Last 3 Encounters:  11/18/23 118/86  09/03/23 118/82  08/18/23 108/67    Wt Readings from Last 3 Encounters:  11/18/23 124 lb (56.2 kg)  09/03/23 126 lb 9.6 oz (57.4 kg)  08/18/23 126 lb (57.2 kg)    Physical Exam Constitutional:      General: She is not in acute distress.    Appearance: Normal appearance. She is well-developed.  HENT:     Head: Normocephalic.     Right Ear: External ear normal.     Left Ear: External ear normal.     Nose: Nose normal.  Eyes:     General:        Right eye: No discharge.        Left eye: No discharge.     Conjunctiva/sclera: Conjunctivae normal.     Pupils: Pupils are equal, round, and reactive to light.  Neck:     Thyroid : No thyromegaly.     Vascular: No JVD.     Trachea: No tracheal deviation.  Cardiovascular:     Rate and Rhythm: Normal rate and regular rhythm.     Heart sounds: Normal heart sounds.  Pulmonary:     Effort: No respiratory distress.     Breath sounds: No stridor. No wheezing.  Abdominal:     General: Bowel sounds are normal. There is no distension.     Palpations: Abdomen is soft. There is no mass.     Tenderness: There is no abdominal tenderness. There is no guarding or rebound.  Musculoskeletal:        General: No tenderness.     Cervical back: Normal range of motion and neck supple. No rigidity.  Lymphadenopathy:     Cervical: No cervical adenopathy.   Skin:    Findings: No erythema or rash.  Neurological:     Cranial Nerves: No cranial nerve deficit.     Motor: No abnormal muscle tone.     Coordination: Coordination normal.     Deep Tendon Reflexes: Reflexes normal.  Psychiatric:        Behavior: Behavior normal.  Thought Content: Thought content normal.        Judgment: Judgment normal.   R eye prosthesis  Lab Results  Component Value Date   WBC 4.7 03/21/2023   HGB 15.2 (H) 03/21/2023   HCT 43.8 03/21/2023   PLT 198.0 03/21/2023   GLUCOSE 97 03/21/2023   CHOL 165 08/18/2020   TRIG 145 08/18/2020   HDL 35 (L) 08/18/2020   LDLDIRECT 121.0 02/26/2018   LDLCALC 104 (H) 08/18/2020   ALT 12 10/01/2022   AST 14 10/01/2022   NA 139 03/21/2023   K 4.6 03/21/2023   CL 104 03/21/2023   CREATININE 0.63 03/21/2023   BUN 12 03/21/2023   CO2 28 03/21/2023   TSH 2.89 10/01/2022   INR 1.05 08/29/2014   HGBA1C 4.8 03/21/2023    No results found.  Assessment & Plan:   Problem List Items Addressed This Visit     Anxiety disorder   On Trazodone, Nortriptyline       Relevant Orders   CBC with Differential/Platelet   Comprehensive metabolic panel with GFR   Lipid panel   TSH   Urinalysis   Vitamin B12   Asthma   Continue on Symbicort .  Ventolin  as needed      Blind right eye   Doing well w/prosthesis      Relevant Orders   CBC with Differential/Platelet   Comprehensive metabolic panel with GFR   Lipid panel   TSH   Urinalysis   Vitamin B12   Essential hypertension   On Metoprolol       Relevant Orders   CBC with Differential/Platelet   Comprehensive metabolic panel with GFR   Lipid panel   TSH   Urinalysis   Vitamin B12   Hyperglycemia   On Mounjaro  2.5 mg/week      Relevant Orders   CBC with Differential/Platelet   Comprehensive metabolic panel with GFR   Lipid panel   TSH   Urinalysis   Vitamin B12   Other Visit Diagnoses       Positive occult stool blood test    -  Primary    Relevant Orders   Ambulatory referral to Gastroenterology   CBC with Differential/Platelet     Screening for colon cancer       Relevant Orders   Ambulatory referral to Gastroenterology         No orders of the defined types were placed in this encounter.     Follow-up: Return in about 6 months (around 05/18/2024) for a follow-up visit.  Marolyn Noel, MD

## 2023-11-19 ENCOUNTER — Ambulatory Visit: Payer: Self-pay | Admitting: Internal Medicine

## 2023-11-21 ENCOUNTER — Telehealth: Admitting: Physician Assistant

## 2023-11-21 DIAGNOSIS — H6503 Acute serous otitis media, bilateral: Secondary | ICD-10-CM

## 2023-11-21 MED ORDER — CIPROFLOXACIN-DEXAMETHASONE 0.3-0.1 % OT SUSP: 4.0000 [drp] | Freq: Two times a day (BID) | OTIC | 0 refills | Status: AC

## 2023-11-21 NOTE — Progress Notes (Signed)

## 2023-11-26 ENCOUNTER — Other Ambulatory Visit: Payer: Self-pay | Admitting: Internal Medicine

## 2023-12-05 ENCOUNTER — Encounter: Payer: Self-pay | Admitting: Gastroenterology

## 2024-01-20 ENCOUNTER — Other Ambulatory Visit: Payer: Self-pay | Admitting: Internal Medicine

## 2024-01-21 ENCOUNTER — Other Ambulatory Visit: Payer: Self-pay | Admitting: Internal Medicine

## 2024-01-21 ENCOUNTER — Encounter: Payer: Self-pay | Admitting: Gastroenterology

## 2024-01-21 ENCOUNTER — Ambulatory Visit: Admitting: Gastroenterology

## 2024-01-21 ENCOUNTER — Encounter: Payer: Self-pay | Admitting: Internal Medicine

## 2024-01-21 VITALS — BP 110/68 | HR 97 | Ht 65.0 in | Wt 132.0 lb

## 2024-01-21 DIAGNOSIS — R109 Unspecified abdominal pain: Secondary | ICD-10-CM | POA: Diagnosis not present

## 2024-01-21 DIAGNOSIS — R059 Cough, unspecified: Secondary | ICD-10-CM

## 2024-01-21 DIAGNOSIS — K219 Gastro-esophageal reflux disease without esophagitis: Secondary | ICD-10-CM | POA: Diagnosis not present

## 2024-01-21 DIAGNOSIS — Z1211 Encounter for screening for malignant neoplasm of colon: Secondary | ICD-10-CM

## 2024-01-21 DIAGNOSIS — K581 Irritable bowel syndrome with constipation: Secondary | ICD-10-CM

## 2024-01-21 DIAGNOSIS — R195 Other fecal abnormalities: Secondary | ICD-10-CM

## 2024-01-21 MED ORDER — SUTAB 1479-225-188 MG PO TABS: ORAL_TABLET | ORAL | 0 refills | Status: DC
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
## 2024-01-21 NOTE — Progress Notes (Signed)
 ____________________________________________________________  Attending physician addendum:  Thank you for sending this case to me as supervising physician in Dr. Lafonda absence. I have reviewed the entire note and agree with the plan.   Victory Brand, MD  ____________________________________________________________

## 2024-01-21 NOTE — Patient Instructions (Addendum)
 Constipation Samples Linzess  290 mcg 1 tab 30-45 mins before first meal of day with full glass of water Recommend high fiber diet Drink plenty of water  GERD Recommend GERD diet Continue pantoprazole  Continue Pepcid   We have sent the following medications to your pharmacy for you to pick up at your convenience: SUTAB   You have been scheduled for a colonoscopy. Please follow written instructions given to you at your visit today.   If you use inhalers (even only as needed), please bring them with you on the day of your procedure.  DO NOT TAKE 7 DAYS PRIOR TO TEST- Trulicity (dulaglutide) Ozempic, Wegovy (semaglutide) Mounjaro , Zepbound  (tirzepatide ) Bydureon Bcise (exanatide extended release)  DO NOT TAKE 1 DAY PRIOR TO YOUR TEST Rybelsus (semaglutide) Adlyxin (lixisenatide) Victoza (liraglutide) Byetta (exanatide) ___________________________________________________________________________  Due to recent changes in healthcare laws, you may see the results of your imaging and laboratory studies on MyChart before your provider has had a chance to review them.  We understand that in some cases there may be results that are confusing or concerning to you. Not all laboratory results come back in the same time frame and the provider may be waiting for multiple results in order to interpret others.  Please give us  48 hours in order for your provider to thoroughly review all the results before contacting the office for clarification of your results.   _______________________________________________________  If your blood pressure at your visit was 140/90 or greater, please contact your primary care physician to follow up on this.  _______________________________________________________  If you are age 42 or older, your body mass index should be between 23-30. Your Body mass index is 21.97 kg/m. If this is out of the aforementioned range listed, please consider follow up with your Primary  Care Provider.  If you are age 76 or younger, your body mass index should be between 19-25. Your Body mass index is 21.97 kg/m. If this is out of the aformentioned range listed, please consider follow up with your Primary Care Provider.   ________________________________________________________  The [facility] providers would like to encourage you to use MYCHART to communicate with providers for non-urgent requests or questions.  Due to long hold times on the telephone, sending your provider a message by [facility] may be a faster and more efficient way to get a response.  Please allow 48 business hours for a response.  Please remember that this is for non-urgent requests.  _______________________________________________________  [facility] is using a team-based approach to care.  Your team is made up of your doctor and two to three APPS. Our APPS (Nurse Practitioners and Physician Assistants) work with your physician to ensure care continuity for you. They are fully qualified to address your health concerns and develop a treatment plan. They communicate directly with your gastroenterologist to care for you. Seeing the Advanced Practice Practitioners on your physician's team can help you by facilitating care more promptly, often allowing for earlier appointments, access to diagnostic testing, procedures, and other specialty referrals.   Thank you for trusting me with your gastrointestinal care. Deanna May, FNP-C

## 2024-02-10 ENCOUNTER — Encounter: Payer: Self-pay | Admitting: Internal Medicine

## 2024-02-12 ENCOUNTER — Other Ambulatory Visit: Payer: Self-pay | Admitting: Medical Genetics

## 2024-02-17 ENCOUNTER — Ambulatory Visit: Admitting: Internal Medicine

## 2024-02-17 ENCOUNTER — Encounter: Payer: Self-pay | Admitting: Internal Medicine

## 2024-02-17 VITALS — BP 120/91 | HR 73 | Temp 97.7°F | Resp 10 | Ht 65.0 in | Wt 132.0 lb

## 2024-02-17 DIAGNOSIS — K2289 Other specified disease of esophagus: Secondary | ICD-10-CM | POA: Diagnosis not present

## 2024-02-17 DIAGNOSIS — K259 Gastric ulcer, unspecified as acute or chronic, without hemorrhage or perforation: Secondary | ICD-10-CM | POA: Diagnosis not present

## 2024-02-17 DIAGNOSIS — K2970 Gastritis, unspecified, without bleeding: Secondary | ICD-10-CM | POA: Diagnosis not present

## 2024-02-17 DIAGNOSIS — K648 Other hemorrhoids: Secondary | ICD-10-CM

## 2024-02-17 DIAGNOSIS — K219 Gastro-esophageal reflux disease without esophagitis: Secondary | ICD-10-CM | POA: Diagnosis not present

## 2024-02-17 DIAGNOSIS — Z1211 Encounter for screening for malignant neoplasm of colon: Secondary | ICD-10-CM

## 2024-02-17 DIAGNOSIS — R195 Other fecal abnormalities: Secondary | ICD-10-CM

## 2024-02-17 DIAGNOSIS — K5730 Diverticulosis of large intestine without perforation or abscess without bleeding: Secondary | ICD-10-CM | POA: Diagnosis not present

## 2024-02-17 MED ORDER — SODIUM CHLORIDE 0.9 % IV SOLN: 500.0000 mL | Freq: Once | INTRAVENOUS | Status: DC

## 2024-02-17 MED ORDER — PANTOPRAZOLE SODIUM 40 MG PO TBEC: 40.0000 mg | DELAYED_RELEASE_TABLET | Freq: Two times a day (BID) | ORAL | 0 refills | Status: AC

## 2024-02-17 NOTE — Op Note (Signed)
 [facility] Patient Name: [name] Procedure Date: 02/17/2024 8:43 AM MRN: [id number] Endoscopist: Rosario Estefana Kidney , , [id number] Age: 54 Referring MD:  Date of Birth: 1970/03/21 Gender: Female Account #: [id number] Procedure:                Colonoscopy Indications:              Screening for colorectal malignant neoplasm, This                            is the patient's first colonoscopy, Incidental -                            Positive fecal immunochemical test Medicines:                Monitored Anesthesia Care Procedure:                Pre-Anesthesia Assessment:                           - Prior to the procedure, a History and Physical                            was performed, and patient medications and                            allergies were reviewed. The patient's tolerance of                            previous anesthesia was also reviewed. The risks                            and benefits of the procedure and the sedation                            options and risks were discussed with the patient.                            All questions were answered, and informed consent                            was obtained. Prior Anticoagulants: The patient has                            taken no anticoagulant or antiplatelet agents. ASA                            Grade Assessment: III - A patient with severe                            systemic disease. After reviewing the risks and                            benefits, the patient was deemed in satisfactory  condition to undergo the procedure.                           After obtaining informed consent, the colonoscope                            was passed under direct vision. Throughout the                            procedure, the patient's blood pressure, pulse, and                            oxygen  saturations were monitored continuously. The                            Olympus Scope  [id number] was introduced through the                            anus and advanced to the the terminal ileum. The                            colonoscopy was performed without difficulty. The                            patient tolerated the procedure well. The quality                            of the bowel preparation was good. The terminal                            ileum, ileocecal valve, appendiceal orifice, and                            rectum were photographed. Scope In: 9:05:20 AM Scope Out: 9:32:39 AM Scope Withdrawal Time: 0 hours 19 minutes 19 seconds  Total Procedure Duration: 0 hours 27 minutes 19 seconds  Findings:                 The terminal ileum appeared normal.                           A few diverticula were found in the sigmoid colon.                           Non-bleeding internal hemorrhoids were found during                            retroflexion. Complications:            No immediate complications. Estimated Blood Loss:     Estimated blood loss: none. Impression:               - The examined portion of the ileum was normal.                           - Diverticulosis in the sigmoid  colon.                           - Non-bleeding internal hemorrhoids.                           - No specimens collected. Recommendation:           - Discharge patient to home (with escort).                           - The findings and recommendations were discussed                            with the patient.                           - Repeat colonoscopy in 10 years for screening                            purposes. Dr Estefana Federico Rosario Estefana Federico,  02/17/2024 9:40:26 AM

## 2024-02-17 NOTE — Patient Instructions (Addendum)
 -Await pathology results -Handout on hemorrhoids and diverticulosis provided -Increase pantoprazole  40 mg twice a day for 2 months, the decrease back down to once a day -Repeat upper endoscopy in 2 months to check for healing -Avoid NSAID's if possible  YOU HAD AN ENDOSCOPIC PROCEDURE TODAY AT THE [facility]:   Refer to the procedure report that was given to you for any specific questions about what was found during the examination.  If the procedure report does not answer your questions, please call your gastroenterologist to clarify.  If you requested that your care partner not be given the details of your procedure findings, then the procedure report has been included in a sealed envelope for you to review at your convenience later.  YOU SHOULD EXPECT: Some feelings of bloating in the abdomen. Passage of more gas than usual.  Walking can help get rid of the air that was put into your GI tract during the procedure and reduce the bloating. If you had a lower endoscopy (such as a colonoscopy or flexible sigmoidoscopy) you may notice spotting of blood in your stool or on the toilet paper. If you underwent a bowel prep for your procedure, you may not have a normal bowel movement for a few days.  Please Note:  You might notice some irritation and congestion in your nose or some drainage.  This is from the oxygen  used during your procedure.  There is no need for concern and it should clear up in a day or so.  SYMPTOMS TO REPORT IMMEDIATELY:  Following lower endoscopy (colonoscopy or flexible sigmoidoscopy):  Excessive amounts of blood in the stool  Significant tenderness or worsening of abdominal pains  Swelling of the abdomen that is new, acute  Fever of 100F or higher  Following upper endoscopy (EGD)  Vomiting of blood or coffee ground material  New chest pain or pain under the shoulder blades  Painful or persistently difficult swallowing  New shortness of breath  Fever of 100F  or higher  Black, tarry-looking stools  For urgent or emergent issues, a gastroenterologist can be reached at any hour by calling [phone]. Do not use MyChart messaging for urgent concerns.    DIET:  We do recommend a small meal at first, but then you may proceed to your regular diet.  Drink plenty of fluids but you should avoid alcoholic beverages for 24 hours.  ACTIVITY:  You should plan to take it easy for the rest of today and you should NOT DRIVE or use heavy machinery until tomorrow (because of the sedation medicines used during the test).    FOLLOW UP: Our staff will call the number listed on your records the next business day following your procedure.  We will call around 7:15- 8:00 am to check on you and address any questions or concerns that you may have regarding the information given to you following your procedure. If we do not reach you, we will leave a message.     If any biopsies were taken you will be contacted by phone or by letter within the next 1-3 weeks.  Please call us  at [phone] if you have not heard about the biopsies in 3 weeks.    SIGNATURES/CONFIDENTIALITY: You and/or your care partner have signed paperwork which will be entered into your electronic medical record.  These signatures attest to the fact that that the information above on your After Visit Summary has been reviewed and is understood.  Full responsibility of the  confidentiality of this discharge information lies with you and/or your care-partner.

## 2024-02-17 NOTE — Op Note (Signed)
 [facility] Patient Name: [name] Procedure Date: 02/17/2024 8:50 AM MRN: [id number] Endoscopist: Rosario Estefana Kidney , , [phone] Age: 54 Referring MD:  Date of Birth: 1970-10-26 Gender: Female Account #: [id number] Procedure:                Upper GI endoscopy Indications:              Heartburn Medicines:                Monitored Anesthesia Care Procedure:                Pre-Anesthesia Assessment:                           - Prior to the procedure, a History and Physical                            was performed, and patient medications and                            allergies were reviewed. The patient's tolerance of                            previous anesthesia was also reviewed. The risks                            and benefits of the procedure and the sedation                            options and risks were discussed with the patient.                            All questions were answered, and informed consent                            was obtained. Prior Anticoagulants: The patient has                            taken no anticoagulant or antiplatelet agents. ASA                            Grade Assessment: III - A patient with severe                            systemic disease. After reviewing the risks and                            benefits, the patient was deemed in satisfactory                            condition to undergo the procedure.                           After obtaining informed consent, the endoscope was  passed under direct vision. Throughout the                            procedure, the patient's blood pressure, pulse, and                            oxygen  saturations were monitored continuously. The                            GIF [id number] was introduced through the                            mouth, and advanced to the second part of duodenum.                            The upper GI endoscopy was  accomplished without                            difficulty. The patient tolerated the procedure                            well. Scope In: Scope Out: Findings:                 Salmon-colored mucosa was present. The maximum                            longitudinal extent of these esophageal mucosal                            changes was 1 cm in length. Mucosa was biopsied                            with a cold forceps for histology. One specimen                            bottle was sent to pathology.                           Localized inflammation characterized by congestion                            (edema), erythema and serpentine ulcerations was                            found in the gastric antrum. Biopsies were taken                            with a cold forceps for histology.                           The examined duodenum was normal. Complications:            No immediate complications. Estimated Blood Loss:     Estimated blood loss was minimal. Impression:               -  Salmon-colored mucosa suspicious for                            short-segment Barrett's esophagus. Biopsied.                           - Gastritis. Biopsied.                           - Normal examined duodenum. Recommendation:           - Await pathology results.                           - Avoid NSAIDs if possible                           - Increase pantoprazole  40 mg to BID for 2 months,                            then decrease back down to QD.                           - Repeat upper endoscopy in 2 months to check                            healing.                           - Perform a colonoscopy today. Dr Estefana Federico Rosario Estefana Federico,  02/17/2024 9:37:27 AM

## 2024-02-17 NOTE — Progress Notes (Signed)
 "   GASTROENTEROLOGY PROCEDURE H&P NOTE   Primary Care Physician: Garald Karlynn GAILS, MD    Reason for Procedure:   GERD, colon cancer screening, positive FOBT  Plan:    EGD/colonoscopy  Patient is appropriate for endoscopic procedure(s) in the ambulatory ([facility]) setting.  The nature of the procedure, as well as the risks, benefits, and alternatives were carefully and thoroughly reviewed with the patient. Ample time for discussion and questions allowed. The patient understood, was satisfied, and agreed to proceed.     HPI: [name] is a 54 y.o. female who presents for EGD/colonoscopy for evaluation of GERD, colon cancer screening, positive FOBT.  Patient was most recently seen in the Gastroenterology Clinic on 01/21/24.  No interval change in medical history since that appointment. Please refer to that note for full details regarding GI history and clinical presentation.   Past Medical History:  Diagnosis Date   Allergy 2011   Doxicyline severe   Anxiety state, unspecified    Arthritis    Asthma    Attention deficit disorder without mention of hyperactivity    Bipolar disorder (HCC)    Constipation    takes Linzess    COPD (chronic obstructive pulmonary disease) (HCC)    Coronary artery disease    Depressive disorder, not elsewhere classified    Esophageal reflux    Hypertension    Migraine without aura, without mention of intractable migraine without mention of status migrainosus    Neuromuscular disorder (HCC) 2023   Pain in joint, site unspecified    Pneumonia    Polycythemia    per pt   Pre-diabetes    Seizures (HCC) 2023   From TBI   Unspecified asthma(493.90)    Unspecified essential hypertension    Unspecified vitamin D  deficiency     Past Surgical History:  Procedure Laterality Date   ABDOMINAL HYSTERECTOMY     partial   abdominal ultrasound  10/12/1997   APPENDECTOMY  05/27/2013   gangrenous   CLOSED REDUCTION METACARPAL WITH PERCUTANEOUS  PINNING Left 12/18/2021   Procedure: LEFT SECOND METACARPEL PINNING;  Surgeon: Sheril [name], MD;  Location: [facility];  Service: Orthopedics;  Laterality: Left;   ENUCLEATION Right 06/27/2023   FRACTURE SURGERY  2023   Left hand   INCISE AND DRAIN ABCESS  2011   Right axilla- Dr. Jessy   KNEE ARTHROSCOPY     both knees   KNEE ARTHROSCOPY WITH FULKERSON SLIDE Right 10/17/2015   Procedure: KNEE ARTHROSCOPY WITH TIA RANK;  Surgeon: [name] Sheril, MD;  Location: [facility];  Service: Orthopedics;  Laterality: Right;   NM MYOCAR PERF WALL MOTION  11/2009   bruce myoview ; perfusion defect in anterior region consistent with breast attenuation; remaining myocardium with normal perfusion; post-stress EF 74%; low risk scan    ORIF TIBIA FRACTURE Right 11/24/2015   Procedure: OPEN REDUCTION INTERNAL FIXATION (ORIF) TIBIA FRACTURE;  Surgeon: [name] Sheril, MD;  Location: [facility];  Service: Orthopedics;  Laterality: Right;   PARTIAL HYSTERECTOMY  2007   PLANTAR FASCIA SURGERY  2000   TONSILLECTOMY  04/29/2000   TONSILLECTOMY Bilateral    TRANSTHORACIC ECHOCARDIOGRAM  11/2009   EF=>55%; trace MR & TR   TUBAL LIGATION  1995    Prior to Admission medications  Medication Sig Start Date End Date Taking? Authorizing Provider  atorvastatin  (LIPITOR) 40 MG tablet TAKE 1 TABLET BY MOUTH DAILY 04/25/23  Yes Croitoru, Mihai, MD  azelastine  (ASTELIN ) 0.1 % nasal spray Place 2 sprays into both nostrils  2 (two) times daily. Use in each nostril as directed 04/13/23  Yes McLean, Darius, PA-C  celecoxib  (CELEBREX ) 200 MG capsule TAKE 1 CAPSULE BY MOUTH TWICE  DAILY 12/02/23  Yes Plotnikov, Aleksei V, MD  Cholecalciferol (VITAMIN D3) 50 MCG (2000 UT) capsule Take 1 capsule (2,000 Units total) by mouth daily. 08/21/23  Yes Plotnikov, Aleksei V, MD  cyclobenzaprine (FLEXERIL) 10 MG tablet Take 10 mg by mouth 3 (three) times daily.   Yes [provider]  diclofenac  Sodium (VOLTAREN ) 1 % GEL Apply 4 g  topically 4 (four) times daily.   Yes [provider]  estradiol  (ESTRACE ) 0.5 MG tablet TAKE 1 TABLET BY MOUTH DAILY 08/19/23  Yes Plotnikov, Aleksei V, MD  LINZESS  145 MCG CAPS capsule TAKE 2 CAPSULES BY MOUTH IN THE  EVENING 01/21/24  Yes Plotnikov, Aleksei V, MD  lisdexamfetamine  (VYVANSE ) 60 MG capsule Take 60 mg by mouth daily. 03/10/23  Yes [provider]  nortriptyline  (PAMELOR ) 25 MG capsule TAKE 1 TO 2 CAPSULES BY MOUTH AT BEDTIME 10/27/23  Yes Plotnikov, Aleksei V, MD  oxyCODONE -acetaminophen  (PERCOCET) 10-325 MG tablet Take 1 tablet by mouth every 4 (four) hours as needed. 03/17/23  Yes [provider]  pantoprazole  (PROTONIX ) 40 MG tablet TAKE 1 TABLET BY MOUTH DAILY 08/19/23  Yes Plotnikov, Aleksei V, MD  pregabalin  (LYRICA ) 100 MG capsule Take 100 mg by mouth 2 (two) times daily.   Yes [provider]  traZODone (DESYREL) 50 MG tablet Take 50 mg by mouth at bedtime.   Yes [provider]  albuterol  (VENTOLIN  HFA) 108 (90 Base) MCG/ACT inhaler USE 2 INHALATIONS BY MOUTH EVERY 6 HOURS AS NEEDED FOR WHEEZING  OR SHORTNESS OF BREATH 01/21/24   Plotnikov, Karlynn GAILS, MD  aspirin  EC 81 MG tablet Take 81 mg by mouth daily.    [provider]  b complex vitamins capsule Take 1 capsule by mouth daily. 08/21/23   Plotnikov, Aleksei V, MD  benzonatate  (TESSALON ) 100 MG capsule Take 1 capsule (100 mg total) by mouth 2 (two) times daily as needed for cough. 04/13/23   Rolan Berthold, PA-C  budesonide -formoterol  (SYMBICORT ) 160-4.5 MCG/ACT inhaler Inhale 2 puffs into the lungs 2 (two) times daily. 08/21/23 08/20/24  Plotnikov, Karlynn GAILS, MD  famotidine  (PEPCID ) 40 MG tablet TAKE 1 TABLET BY MOUTH DAILY 11/27/23   Plotnikov, Aleksei V, MD  fluticasone  (FLONASE ) 50 MCG/ACT nasal spray Place 2 sprays into both nostrils daily. 02/27/23   Plotnikov, Aleksei V, MD  mupirocin  ointment (BACTROBAN ) 2 % APPLY TO AFFECTED AREA(S)  TOPICALLY ON BURN WOUNDS TWICE   DAILY 01/21/24   Plotnikov, Aleksei V, MD  naloxone  (NARCAN ) nasal spray 4 mg/0.1 mL Spray 1 spray into the nose once as needed for signs of overdose Patient taking differently: Place 0.4 mg into the nose once as needed. Spray 1 spray into the nose once as needed for signs of overdose 11/30/21   Elnor Lauraine BRAVO, NP  SUMAtriptan  (IMITREX ) 50 MG tablet Take 1 tablet (50 mg total) by mouth every 2 (two) hours as needed for migraine. May repeat in 2 hours if headache persists or recurs. 09/03/23   Whitfield Raisin, NP  tirzepatide  (MOUNJARO ) 7.5 MG/0.5ML Pen Inject 5 mg into the skin once a week.    [provider]    Current Outpatient Medications  Medication Sig Dispense Refill   atorvastatin  (LIPITOR) 40 MG tablet TAKE 1 TABLET BY MOUTH DAILY 90 tablet 3   azelastine  (ASTELIN ) 0.1 % nasal  spray Place 2 sprays into both nostrils 2 (two) times daily. Use in each nostril as directed 30 mL 12   celecoxib  (CELEBREX ) 200 MG capsule TAKE 1 CAPSULE BY MOUTH TWICE  DAILY 200 capsule 2   Cholecalciferol (VITAMIN D3) 50 MCG (2000 UT) capsule Take 1 capsule (2,000 Units total) by mouth daily. 100 capsule 3   cyclobenzaprine (FLEXERIL) 10 MG tablet Take 10 mg by mouth 3 (three) times daily.     diclofenac  Sodium (VOLTAREN ) 1 % GEL Apply 4 g topically 4 (four) times daily.     estradiol  (ESTRACE ) 0.5 MG tablet TAKE 1 TABLET BY MOUTH DAILY 100 tablet 2   LINZESS  145 MCG CAPS capsule TAKE 2 CAPSULES BY MOUTH IN THE  EVENING 180 capsule 3   lisdexamfetamine  (VYVANSE ) 60 MG capsule Take 60 mg by mouth daily.     nortriptyline  (PAMELOR ) 25 MG capsule TAKE 1 TO 2 CAPSULES BY MOUTH AT BEDTIME 180 capsule 3   oxyCODONE -acetaminophen  (PERCOCET) 10-325 MG tablet Take 1 tablet by mouth every 4 (four) hours as needed.     pantoprazole  (PROTONIX ) 40 MG tablet TAKE 1 TABLET BY MOUTH DAILY 100 tablet 2   pregabalin  (LYRICA ) 100 MG capsule Take 100 mg by mouth 2 (two) times daily.     traZODone (DESYREL) 50 MG tablet Take  50 mg by mouth at bedtime.     albuterol  (VENTOLIN  HFA) 108 (90 Base) MCG/ACT inhaler USE 2 INHALATIONS BY MOUTH EVERY 6 HOURS AS NEEDED FOR WHEEZING  OR SHORTNESS OF BREATH 54 g 3   aspirin  EC 81 MG tablet Take 81 mg by mouth daily.     b complex vitamins capsule Take 1 capsule by mouth daily. 100 capsule 3   benzonatate  (TESSALON ) 100 MG capsule Take 1 capsule (100 mg total) by mouth 2 (two) times daily as needed for cough. 20 capsule 0   budesonide -formoterol  (SYMBICORT ) 160-4.5 MCG/ACT inhaler Inhale 2 puffs into the lungs 2 (two) times daily. 3 each 3   famotidine  (PEPCID ) 40 MG tablet TAKE 1 TABLET BY MOUTH DAILY 100 tablet 2   fluticasone  (FLONASE ) 50 MCG/ACT nasal spray Place 2 sprays into both nostrils daily. 48 g 3   mupirocin  ointment (BACTROBAN ) 2 % APPLY TO AFFECTED AREA(S)  TOPICALLY ON BURN WOUNDS TWICE  DAILY 30 g 0   naloxone  (NARCAN ) nasal spray 4 mg/0.1 mL Spray 1 spray into the nose once as needed for signs of overdose (Patient taking differently: Place 0.4 mg into the nose once as needed. Spray 1 spray into the nose once as needed for signs of overdose) 1 each 1   SUMAtriptan  (IMITREX ) 50 MG tablet Take 1 tablet (50 mg total) by mouth every 2 (two) hours as needed for migraine. May repeat in 2 hours if headache persists or recurs. 10 tablet 11   tirzepatide  (MOUNJARO ) 7.5 MG/0.5ML Pen Inject 5 mg into the skin once a week.     Current Facility-Administered Medications  Medication Dose Route Frequency Provider Last Rate Last Admin   0.9 %  sodium chloride  infusion  500 mL Intravenous Once Isidoro Santillana C, MD        Allergies as of 02/17/2024 - Review Complete 02/17/2024  Allergen Reaction Noted   Lisinopril Cough    Amoxicillin Itching    Ancef [cefazolin] Itching 11/22/2014   Doxycycline Nausea And Vomiting 05/02/2009   Fentanyl  Nausea And Vomiting 12/17/2021   Hydrochlorothiazide  Other (See Comments) 11/12/2006   Nizoral  [ketoconazole ] Other (See Comments) 02/19/2017  Penicillins Itching 01/19/2013   Percocet [oxycodone -acetaminophen ] Itching 05/06/2012   Talwin [pentazocine] Nausea And Vomiting 05/06/2012    Family History  Problem Relation Age of Onset   Breast cancer Mother 35   Multiple sclerosis Mother    Depression Mother    Cancer Mother    Diabetes Mother    Hypertension Mother    Heart disease Father        cardiac arrest   Diabetes Father    Hypertension Father    Stroke Father    Lupus Sister        PTSD   Alcohol abuse Sister    Bipolar disorder Sister    ADD / ADHD Sister    Heart disease Maternal Grandfather        cardiac arrest   Hypertension Maternal Grandfather    COPD Paternal Grandmother    Diabetes Paternal Grandfather    Heart disease Paternal Grandfather    Alcohol abuse Brother        also HTN, lupus, enlarged heart   Diabetes Brother    Hypertension Brother    Stroke Brother    ADD / ADHD Child    Asthma Child    Asthma Other        Family history   Coronary artery disease Other        1st degree female < 50   Diabetes Other        1st degree relative    Social History   Socioeconomic History   Marital status: Married    Spouse name: johnny   Number of children: 2   Years of education: Not on file   Highest education level: Associate degree: occupational, scientist, product/process development, or vocational program  Occupational History   Occupation: HR; waiting table 2 d/wk; back to school; filed for disability; lost her job 2011     Employer: [employer]  Tobacco Use   Smoking status: Every Day    Current packs/day: 0.50    Average packs/day: 0.9 packs/day for 37.3 years (32.7 ttl pk-yrs)    Types: Cigarettes    Start date: 10/31/1986   Smokeless tobacco: Never   Tobacco comments:    03/21/2023 patient smokes a pack daily    verified 06/07/20  Vaping Use   Vaping status: Every Day   Substances: Nicotine   Substance and Sexual Activity   Alcohol use: No    Alcohol/week: 0.0 standard drinks of alcohol   Drug use: No    Sexual activity: Yes    Birth control/protection: None  Other Topics Concern   Not on file  Social History Narrative   Patient lives at home with her mother.   Disabled   Right handed   Caffeine  three cups daily   Some college education   Social Drivers of Health   Tobacco Use: High Risk (01/21/2024)   Patient History    Smoking Tobacco Use: Every Day    Smokeless Tobacco Use: Never    Passive Exposure: Not on file  Financial Resource Strain: Low Risk (11/18/2023)   Overall Financial Resource Strain (CARDIA)    Difficulty of Paying Living Expenses: Not very hard  Food Insecurity: No Food Insecurity (11/18/2023)   Epic    Worried About Programme Researcher, Broadcasting/film/video in the Last Year: Never true    Ran Out of Food in the Last Year: Never true  Transportation Needs: No Transportation Needs (11/18/2023)   Epic    Lack of Transportation (Medical): No    Lack of  Transportation (Non-Medical): No  Physical Activity: Sufficiently Active (11/18/2023)   Exercise Vital Sign    Days of Exercise per Week: 4 days    Minutes of Exercise per Session: 40 min  Stress: No Stress Concern Present (11/18/2023)   [employer] of Occupational Health - Occupational Stress Questionnaire    Feeling of Stress: Only a little  Social Connections: Socially Integrated (11/18/2023)   Social Connection and Isolation Panel    Frequency of Communication with Friends and Family: More than three times a week    Frequency of Social Gatherings with Friends and Family: Once a week    Attends Religious Services: More than 4 times per year    Active Member of [employer] or Organizations: Yes    Attends Banker Meetings: Never    Marital Status: Married  Catering Manager Violence: Not on file  Depression (PHQ2-9): Low Risk (02/18/2022)   Depression (PHQ2-9)    PHQ-2 Score: 0  Alcohol Screen: Low Risk (11/18/2023)   Alcohol Screen    Last Alcohol Screening Score (AUDIT): 2  Housing: Low Risk (11/18/2023)    Epic    Unable to Pay for Housing in the Last Year: No    Number of Times Moved in the Last Year: 0    Homeless in the Last Year: No  Utilities: Not At Risk (06/20/2022)   [facility] Utilities    Threatened with loss of utilities: No  Health Literacy: Not on file    Physical Exam: Vital signs in last 24 hours: BP 125/72   Pulse 82   Temp 97.7 F (36.5 C)   Ht 5' 5 (1.651 m)   Wt 132 lb (59.9 kg)   SpO2 100%   BMI 21.97 kg/m  GEN: NAD EYE: Sclerae anicteric ENT: MMM CV: Non-tachycardic Pulm: No increased WOB GI: Soft NEURO:  Alert & Oriented   Estefana Kidney, MD [facility]   02/17/2024 8:44 AM  "

## 2024-02-17 NOTE — Progress Notes (Signed)
 Called to room to assist during endoscopic procedure.  Patient ID and intended procedure confirmed with present staff. Received instructions for my participation in the procedure from the performing physician.

## 2024-02-17 NOTE — Progress Notes (Signed)
 Sedate, gd SR, tolerated procedure well, VSS, report to RN

## 2024-02-17 NOTE — Progress Notes (Signed)
 Pt's states no medical or surgical changes since previsit or office visit.

## 2024-02-18 ENCOUNTER — Telehealth: Payer: Self-pay

## 2024-02-18 NOTE — Telephone Encounter (Signed)
" °  Follow up Call-     02/17/2024    7:33 AM  Call back number  Post procedure Call Back phone  # [phone]  Permission to leave phone message Yes     Patient questions:  Do you have a fever, pain , or abdominal swelling? No. Pain Score  0 *  Have you tolerated food without any problems? Yes.    Have you been able to return to your normal activities? Yes.    Do you have any questions about your discharge instructions: Diet   No. Medications  No. Follow up visit  No.  Do you have questions or concerns about your Care? No.  Actions: * If pain score is 4 or above: No action needed, pain <4.   "

## 2024-02-19 LAB — SURGICAL PATHOLOGY

## 2024-02-23 ENCOUNTER — Ambulatory Visit: Payer: Self-pay | Admitting: Internal Medicine

## 2024-02-26 ENCOUNTER — Encounter: Payer: Self-pay | Admitting: Internal Medicine

## 2024-04-22 ENCOUNTER — Encounter: Admitting: Internal Medicine

## 2024-05-18 ENCOUNTER — Ambulatory Visit: Admitting: Internal Medicine
